# Patient Record
Sex: Female | Born: 1940
Health system: Southern US, Community
[De-identification: ages and names within clinical notes are randomized; demographics above are authoritative.]

## PROBLEM LIST (undated history)

## (undated) DIAGNOSIS — C801 Malignant (primary) neoplasm, unspecified: Secondary | ICD-10-CM

## (undated) DIAGNOSIS — M359 Systemic involvement of connective tissue, unspecified: Secondary | ICD-10-CM

## (undated) DIAGNOSIS — E079 Disorder of thyroid, unspecified: Secondary | ICD-10-CM

## (undated) DIAGNOSIS — E7800 Pure hypercholesterolemia, unspecified: Secondary | ICD-10-CM

## (undated) DIAGNOSIS — I5032 Chronic diastolic (congestive) heart failure: Secondary | ICD-10-CM

## (undated) DIAGNOSIS — K529 Noninfective gastroenteritis and colitis, unspecified: Secondary | ICD-10-CM

## (undated) DIAGNOSIS — D649 Anemia, unspecified: Secondary | ICD-10-CM

## (undated) DIAGNOSIS — E119 Type 2 diabetes mellitus without complications: Secondary | ICD-10-CM

## (undated) DIAGNOSIS — M069 Rheumatoid arthritis, unspecified: Secondary | ICD-10-CM

## (undated) DIAGNOSIS — K635 Polyp of colon: Secondary | ICD-10-CM

## (undated) DIAGNOSIS — I350 Nonrheumatic aortic (valve) stenosis: Secondary | ICD-10-CM

## (undated) DIAGNOSIS — Z952 Presence of prosthetic heart valve: Secondary | ICD-10-CM

## (undated) DIAGNOSIS — K589 Irritable bowel syndrome without diarrhea: Secondary | ICD-10-CM

## (undated) DIAGNOSIS — K625 Hemorrhage of anus and rectum: Secondary | ICD-10-CM

## (undated) DIAGNOSIS — Z950 Presence of cardiac pacemaker: Secondary | ICD-10-CM

## (undated) DIAGNOSIS — F32A Depression, unspecified: Secondary | ICD-10-CM

## (undated) DIAGNOSIS — I442 Atrioventricular block, complete: Secondary | ICD-10-CM

## (undated) DIAGNOSIS — I10 Essential (primary) hypertension: Secondary | ICD-10-CM

## (undated) DIAGNOSIS — M797 Fibromyalgia: Secondary | ICD-10-CM

## (undated) DIAGNOSIS — IMO0001 Reserved for inherently not codable concepts without codable children: Secondary | ICD-10-CM

## (undated) DIAGNOSIS — T1491XA Suicide attempt, initial encounter: Secondary | ICD-10-CM

## (undated) DIAGNOSIS — E039 Hypothyroidism, unspecified: Secondary | ICD-10-CM

## (undated) DIAGNOSIS — I2510 Atherosclerotic heart disease of native coronary artery without angina pectoris: Secondary | ICD-10-CM

## (undated) DIAGNOSIS — F329 Major depressive disorder, single episode, unspecified: Secondary | ICD-10-CM

## (undated) HISTORY — DX: Rheumatoid arthritis, unspecified: M06.9

## (undated) HISTORY — DX: Anemia, unspecified: D64.9

## (undated) HISTORY — DX: Polyp of colon: K63.5

## (undated) HISTORY — DX: Essential (primary) hypertension: I10

## (undated) HISTORY — DX: Noninfective gastroenteritis and colitis, unspecified: K52.9

## (undated) HISTORY — DX: Pure hypercholesterolemia, unspecified: E78.00

## (undated) HISTORY — DX: Irritable bowel syndrome, unspecified: K58.9

## (undated) HISTORY — PX: OTHER SURGICAL HISTORY: SHX169

## (undated) HISTORY — DX: Disorder of thyroid, unspecified: E07.9

## (undated) HISTORY — DX: Fibromyalgia: M79.7

## (undated) HISTORY — PX: CARDIAC SURGERY: SHX584

## (undated) HISTORY — DX: Hemorrhage of anus and rectum: K62.5

## (undated) HISTORY — PX: MASTECTOMY: SHX3

## (undated) HISTORY — PX: ABDOMINAL SURGERY: SHX537

## (undated) HISTORY — DX: Type 2 diabetes mellitus without complications: E11.9

## (undated) HISTORY — PX: LEFT OOPHORECTOMY: SHX1961

## (undated) HISTORY — PX: TOTAL ABDOMINAL HYSTERECTOMY: SHX209

---

## 1979-12-04 DIAGNOSIS — C801 Malignant (primary) neoplasm, unspecified: Secondary | ICD-10-CM

## 1979-12-04 HISTORY — PX: SPINE SURGERY: SHX786

## 1979-12-04 HISTORY — DX: Malignant (primary) neoplasm, unspecified: C80.1

## 2000-05-01 ENCOUNTER — Ambulatory Visit (HOSPITAL_COMMUNITY): Admission: RE | Admit: 2000-05-01 | Discharge: 2000-05-01 | Payer: Self-pay | Admitting: Gastroenterology

## 2000-06-06 ENCOUNTER — Encounter: Payer: Self-pay | Admitting: *Deleted

## 2000-06-06 ENCOUNTER — Encounter: Admission: RE | Admit: 2000-06-06 | Discharge: 2000-06-06 | Payer: Self-pay | Admitting: *Deleted

## 2000-07-04 ENCOUNTER — Encounter: Admission: RE | Admit: 2000-07-04 | Discharge: 2000-07-04 | Payer: Self-pay | Admitting: *Deleted

## 2000-07-04 ENCOUNTER — Encounter: Payer: Self-pay | Admitting: *Deleted

## 2001-07-08 ENCOUNTER — Encounter: Admission: RE | Admit: 2001-07-08 | Discharge: 2001-07-08 | Payer: Self-pay | Admitting: Internal Medicine

## 2001-07-08 ENCOUNTER — Encounter: Payer: Self-pay | Admitting: Internal Medicine

## 2001-08-08 ENCOUNTER — Encounter: Payer: Self-pay | Admitting: Internal Medicine

## 2001-08-08 ENCOUNTER — Encounter: Admission: RE | Admit: 2001-08-08 | Discharge: 2001-08-08 | Payer: Self-pay | Admitting: Internal Medicine

## 2003-09-01 ENCOUNTER — Encounter: Admission: RE | Admit: 2003-09-01 | Discharge: 2003-09-01 | Payer: Self-pay | Admitting: Internal Medicine

## 2003-09-01 ENCOUNTER — Encounter: Payer: Self-pay | Admitting: Internal Medicine

## 2004-09-19 ENCOUNTER — Encounter: Admission: RE | Admit: 2004-09-19 | Discharge: 2004-09-19 | Payer: Self-pay | Admitting: Internal Medicine

## 2005-02-19 ENCOUNTER — Encounter: Admission: RE | Admit: 2005-02-19 | Discharge: 2005-02-19 | Payer: Self-pay | Admitting: Internal Medicine

## 2005-04-02 ENCOUNTER — Emergency Department: Payer: Self-pay | Admitting: Emergency Medicine

## 2005-04-28 ENCOUNTER — Observation Stay: Payer: Self-pay | Admitting: Internal Medicine

## 2005-09-11 ENCOUNTER — Encounter: Admission: RE | Admit: 2005-09-11 | Discharge: 2005-09-11 | Payer: Self-pay | Admitting: Neurology

## 2005-11-20 ENCOUNTER — Encounter: Admission: RE | Admit: 2005-11-20 | Discharge: 2005-11-20 | Payer: Self-pay | Admitting: Internal Medicine

## 2005-12-29 ENCOUNTER — Other Ambulatory Visit: Payer: Self-pay

## 2005-12-29 ENCOUNTER — Inpatient Hospital Stay: Payer: Self-pay

## 2005-12-30 ENCOUNTER — Other Ambulatory Visit: Payer: Self-pay

## 2006-02-04 ENCOUNTER — Ambulatory Visit: Payer: Self-pay | Admitting: Gastroenterology

## 2006-02-20 ENCOUNTER — Ambulatory Visit: Payer: Self-pay | Admitting: Gastroenterology

## 2006-02-20 ENCOUNTER — Encounter (INDEPENDENT_AMBULATORY_CARE_PROVIDER_SITE_OTHER): Payer: Self-pay | Admitting: Specialist

## 2006-03-24 ENCOUNTER — Inpatient Hospital Stay (HOSPITAL_COMMUNITY): Admission: AC | Admit: 2006-03-24 | Discharge: 2006-03-27 | Payer: Self-pay

## 2006-03-27 ENCOUNTER — Inpatient Hospital Stay (HOSPITAL_COMMUNITY): Admission: RE | Admit: 2006-03-27 | Discharge: 2006-04-01 | Payer: Self-pay | Admitting: *Deleted

## 2006-03-28 ENCOUNTER — Ambulatory Visit: Payer: Self-pay | Admitting: *Deleted

## 2006-04-11 ENCOUNTER — Encounter (HOSPITAL_BASED_OUTPATIENT_CLINIC_OR_DEPARTMENT_OTHER): Admission: RE | Admit: 2006-04-11 | Discharge: 2006-06-03 | Payer: Self-pay | Admitting: Surgery

## 2006-04-26 ENCOUNTER — Ambulatory Visit (HOSPITAL_COMMUNITY): Payer: Self-pay | Admitting: Psychiatry

## 2006-05-17 ENCOUNTER — Ambulatory Visit (HOSPITAL_COMMUNITY): Payer: Self-pay | Admitting: Psychiatry

## 2006-07-24 ENCOUNTER — Ambulatory Visit (HOSPITAL_COMMUNITY): Payer: Self-pay | Admitting: Psychiatry

## 2006-09-16 ENCOUNTER — Encounter: Admission: RE | Admit: 2006-09-16 | Discharge: 2006-09-16 | Payer: Self-pay | Admitting: Internal Medicine

## 2006-10-21 ENCOUNTER — Ambulatory Visit (HOSPITAL_COMMUNITY): Payer: Self-pay | Admitting: Psychiatry

## 2006-11-10 ENCOUNTER — Emergency Department: Payer: Self-pay | Admitting: Internal Medicine

## 2007-01-20 ENCOUNTER — Ambulatory Visit (HOSPITAL_COMMUNITY): Payer: Self-pay | Admitting: Psychiatry

## 2007-04-23 ENCOUNTER — Ambulatory Visit (HOSPITAL_COMMUNITY): Payer: Self-pay | Admitting: Psychiatry

## 2007-04-30 ENCOUNTER — Encounter: Admission: RE | Admit: 2007-04-30 | Discharge: 2007-04-30 | Payer: Self-pay | Admitting: Internal Medicine

## 2007-07-21 ENCOUNTER — Ambulatory Visit (HOSPITAL_COMMUNITY): Payer: Self-pay | Admitting: Psychiatry

## 2007-10-20 ENCOUNTER — Ambulatory Visit (HOSPITAL_COMMUNITY): Payer: Self-pay | Admitting: Psychiatry

## 2008-01-19 ENCOUNTER — Ambulatory Visit (HOSPITAL_COMMUNITY): Payer: Self-pay | Admitting: Psychiatry

## 2008-04-12 ENCOUNTER — Ambulatory Visit (HOSPITAL_COMMUNITY): Payer: Self-pay | Admitting: Psychiatry

## 2008-05-17 ENCOUNTER — Telehealth: Payer: Self-pay | Admitting: Gastroenterology

## 2008-06-09 ENCOUNTER — Ambulatory Visit (HOSPITAL_COMMUNITY): Payer: Self-pay | Admitting: Psychiatry

## 2008-07-02 ENCOUNTER — Ambulatory Visit (HOSPITAL_COMMUNITY): Payer: Self-pay | Admitting: Psychiatry

## 2008-09-29 ENCOUNTER — Ambulatory Visit (HOSPITAL_COMMUNITY): Payer: Self-pay | Admitting: Psychiatry

## 2008-10-30 ENCOUNTER — Emergency Department: Payer: Self-pay | Admitting: Unknown Physician Specialty

## 2008-11-22 ENCOUNTER — Encounter: Admission: RE | Admit: 2008-11-22 | Discharge: 2008-11-22 | Payer: Self-pay | Admitting: Internal Medicine

## 2008-12-29 ENCOUNTER — Ambulatory Visit (HOSPITAL_COMMUNITY): Payer: Self-pay | Admitting: Psychiatry

## 2009-03-21 ENCOUNTER — Ambulatory Visit (HOSPITAL_COMMUNITY): Payer: Self-pay | Admitting: Psychiatry

## 2009-06-20 ENCOUNTER — Ambulatory Visit (HOSPITAL_COMMUNITY): Payer: Self-pay | Admitting: Psychiatry

## 2009-09-14 ENCOUNTER — Ambulatory Visit (HOSPITAL_COMMUNITY): Payer: Self-pay | Admitting: Psychiatry

## 2009-11-13 ENCOUNTER — Inpatient Hospital Stay: Payer: Self-pay | Admitting: General Surgery

## 2009-11-28 ENCOUNTER — Encounter: Admission: RE | Admit: 2009-11-28 | Discharge: 2009-11-28 | Payer: Self-pay | Admitting: Internal Medicine

## 2009-12-21 ENCOUNTER — Ambulatory Visit (HOSPITAL_COMMUNITY): Payer: Self-pay | Admitting: Psychiatry

## 2010-03-15 ENCOUNTER — Ambulatory Visit (HOSPITAL_COMMUNITY): Payer: Self-pay | Admitting: Psychiatry

## 2010-03-21 ENCOUNTER — Encounter: Admission: RE | Admit: 2010-03-21 | Discharge: 2010-03-21 | Payer: Self-pay | Admitting: Internal Medicine

## 2010-04-21 ENCOUNTER — Encounter: Admission: RE | Admit: 2010-04-21 | Discharge: 2010-04-21 | Payer: Self-pay | Admitting: Internal Medicine

## 2010-05-11 ENCOUNTER — Encounter: Admission: RE | Admit: 2010-05-11 | Discharge: 2010-05-11 | Payer: Self-pay | Admitting: Internal Medicine

## 2010-07-26 ENCOUNTER — Ambulatory Visit (HOSPITAL_COMMUNITY): Payer: Self-pay | Admitting: Psychiatry

## 2010-09-11 ENCOUNTER — Encounter: Admission: RE | Admit: 2010-09-11 | Discharge: 2010-09-11 | Payer: Self-pay | Admitting: Internal Medicine

## 2010-11-22 ENCOUNTER — Ambulatory Visit (HOSPITAL_COMMUNITY): Payer: Self-pay | Admitting: Psychiatry

## 2011-02-14 ENCOUNTER — Encounter: Payer: Self-pay | Admitting: Gastroenterology

## 2011-02-20 NOTE — Letter (Signed)
Summary: Colonoscopy Letter  [facility]  [address] [place], [place] [address]   Phone: [phone]  Fax: [phone]      February 14, 2011 MRN: [id number]   Catherine [name] [address] [place], [place]  [address]   Dear [name],   According to your medical record, it is time for you to schedule a Colonoscopy. The American Cancer Society recommends this procedure as a method to detect early colon cancer. Patients with a family history of colon cancer, or a personal history of colon polyps or inflammatory bowel disease are at increased risk.  This letter has been generated based on the recommendations made at the time of your procedure. If you feel that in your particular situation this may no longer apply, please contact our office.  Please call our office at [phone] to schedule this appointment or to update your records at your earliest convenience.  Thank you for cooperating with [place] to provide you with the very best care possible.   Sincerely,  Judie Petit T. Russella Dar, M.D.  [facility] [phone]

## 2011-03-21 ENCOUNTER — Encounter (HOSPITAL_COMMUNITY): Payer: Self-pay | Admitting: Psychiatry

## 2011-03-28 ENCOUNTER — Encounter (HOSPITAL_COMMUNITY): Payer: 59 | Admitting: Psychiatry

## 2011-03-28 DIAGNOSIS — F331 Major depressive disorder, recurrent, moderate: Secondary | ICD-10-CM
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
## 2011-04-20 NOTE — Procedures (Signed)
[facility]  Patient:    Catherine [name], Catherine [name]                       MRN: [id number] Proc. Date: 05/01/00 Adm. Date:  [id number] Attending:  Louie Bun CC:         Georg Ruddle. Viviann Spare, M.D.                           Procedure Report  INDICATION FOR PROCEDURE:  History of adenomatous colon polyps with last colonoscopy for years ago.  DESCRIPTION OF PROCEDURE:  The patient was placed in the left lateral decubitus position and placed on the pulse monitor with continuous low-flow oxygen delivered by nasal cannula.  She was sedated with 70 mg of IV Demerol and 7 mg of IV Versed.  The Olympus video colonoscope was inserted into the rectum and advanced to the cecum, confirmed by transillumination of McBurneys point and visualization of the ileocecal valve and appendiceal orifice.  The prep was good, although slightly limited in the ascending colon and cecum.  I could conclusively rule out small lesions less than 1 cm in diameter in all areas.  Otherwise the cecum, ascending, transverse, descending, and sigmoid colon appeared normal with no masses, polyps, diverticula, or other mucosal abnormalities.  The rectum likewise appeared normal and retroflexed view of the anus did reveal some small internal hemorrhoids.  The colonoscope was then withdrawn and the patient returned to the recovery room in stable condition. She tolerated the procedure well and there were no immediate complications.  IMPRESSION:  Internal hemorrhoids, otherwise normal colonoscopy.  PLAN:  Repeat colonoscopy in five years. DD:  05/01/00 TD:  05/02/00 Job: [id number]
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
## 2011-07-17 ENCOUNTER — Ambulatory Visit
Admission: RE | Admit: 2011-07-17 | Discharge: 2011-07-17 | Disposition: A | Discharge status: Home / Self Care | Payer: 59 | Source: Ambulatory Visit | Attending: Internal Medicine | Admitting: Internal Medicine

## 2011-07-17 ENCOUNTER — Other Ambulatory Visit: Payer: Self-pay | Admitting: Internal Medicine

## 2011-07-17 DIAGNOSIS — R609 Edema, unspecified: Secondary | ICD-10-CM

## 2011-07-17 DIAGNOSIS — M79604 Pain in right leg: Secondary | ICD-10-CM

## 2011-07-30 ENCOUNTER — Encounter (HOSPITAL_COMMUNITY): Payer: 59 | Admitting: Psychiatry

## 2011-08-13 ENCOUNTER — Encounter (HOSPITAL_COMMUNITY): Payer: 59 | Admitting: Psychiatry

## 2011-08-13 DIAGNOSIS — F331 Major depressive disorder, recurrent, moderate: Secondary | ICD-10-CM

## 2011-09-21 ENCOUNTER — Ambulatory Visit: Payer: Self-pay | Admitting: General Surgery

## 2011-12-05 ENCOUNTER — Encounter (HOSPITAL_COMMUNITY): Payer: 59 | Admitting: Psychiatry

## 2011-12-14 ENCOUNTER — Ambulatory Visit (HOSPITAL_COMMUNITY): Payer: 59 | Admitting: Psychiatry

## 2012-01-14 ENCOUNTER — Encounter (HOSPITAL_COMMUNITY): Payer: Self-pay | Admitting: Psychiatry

## 2012-01-14 ENCOUNTER — Ambulatory Visit (INDEPENDENT_AMBULATORY_CARE_PROVIDER_SITE_OTHER): Payer: 59 | Admitting: Psychiatry

## 2012-01-14 VITALS — BP 112/71 | HR 68 | Ht 63.0 in | Wt 221.0 lb

## 2012-01-14 DIAGNOSIS — F329 Major depressive disorder, single episode, unspecified: Secondary | ICD-10-CM

## 2012-01-14 MED ORDER — CITALOPRAM HYDROBROMIDE 40 MG PO TABS: 40.0000 mg | ORAL_TABLET | Freq: Every day | ORAL | Status: DC

## 2012-01-14 MED ORDER — CLONAZEPAM 0.5 MG PO TABS: 0.5000 mg | ORAL_TABLET | Freq: Every evening | ORAL | Status: DC | PRN
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
## 2012-05-14 ENCOUNTER — Ambulatory Visit (HOSPITAL_COMMUNITY): Payer: Self-pay | Admitting: Psychiatry

## 2012-05-26 ENCOUNTER — Encounter (HOSPITAL_COMMUNITY): Payer: Self-pay | Admitting: Psychiatry

## 2012-05-26 ENCOUNTER — Ambulatory Visit (INDEPENDENT_AMBULATORY_CARE_PROVIDER_SITE_OTHER): Payer: 59 | Admitting: Psychiatry

## 2012-05-26 VITALS — BP 113/82 | HR 74 | Wt 218.6 lb

## 2012-05-26 DIAGNOSIS — F329 Major depressive disorder, single episode, unspecified: Secondary | ICD-10-CM

## 2012-05-26 MED ORDER — CITALOPRAM HYDROBROMIDE 40 MG PO TABS: 40.0000 mg | ORAL_TABLET | Freq: Every day | ORAL | Status: DC

## 2012-05-26 MED ORDER — CLONAZEPAM 0.5 MG PO TABS: 0.5000 mg | ORAL_TABLET | Freq: Every evening | ORAL | Status: DC | PRN
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
## 2012-06-26 ENCOUNTER — Other Ambulatory Visit: Payer: Self-pay | Admitting: Internal Medicine

## 2012-06-26 DIAGNOSIS — Z1231 Encounter for screening mammogram for malignant neoplasm of breast: Secondary | ICD-10-CM

## 2012-06-26 DIAGNOSIS — Z9012 Acquired absence of left breast and nipple: Secondary | ICD-10-CM

## 2012-07-11 ENCOUNTER — Ambulatory Visit
Admission: RE | Admit: 2012-07-11 | Discharge: 2012-07-11 | Disposition: A | Discharge status: Home / Self Care | Payer: 59 | Source: Ambulatory Visit | Attending: Internal Medicine | Admitting: Internal Medicine

## 2012-07-11 DIAGNOSIS — Z1231 Encounter for screening mammogram for malignant neoplasm of breast: Secondary | ICD-10-CM

## 2012-07-11 DIAGNOSIS — Z9012 Acquired absence of left breast and nipple: Secondary | ICD-10-CM

## 2012-07-16 ENCOUNTER — Other Ambulatory Visit: Payer: Self-pay | Admitting: Radiation Oncology

## 2012-07-16 ENCOUNTER — Other Ambulatory Visit: Payer: Self-pay | Admitting: Internal Medicine

## 2012-07-16 DIAGNOSIS — R928 Other abnormal and inconclusive findings on diagnostic imaging of breast: Secondary | ICD-10-CM

## 2012-07-23 ENCOUNTER — Other Ambulatory Visit: Payer: Self-pay

## 2012-07-30 ENCOUNTER — Ambulatory Visit
Admission: RE | Admit: 2012-07-30 | Discharge: 2012-07-30 | Disposition: A | Discharge status: Home / Self Care | Payer: Medicare PPO | Source: Ambulatory Visit | Attending: Internal Medicine | Admitting: Internal Medicine

## 2012-07-30 DIAGNOSIS — R928 Other abnormal and inconclusive findings on diagnostic imaging of breast: Secondary | ICD-10-CM

## 2012-08-20 ENCOUNTER — Encounter: Payer: Self-pay | Admitting: Gastroenterology

## 2012-09-16 ENCOUNTER — Other Ambulatory Visit (HOSPITAL_COMMUNITY): Payer: Self-pay | Admitting: Psychiatry

## 2012-09-16 DIAGNOSIS — F329 Major depressive disorder, single episode, unspecified: Secondary | ICD-10-CM

## 2012-09-17 ENCOUNTER — Telehealth (HOSPITAL_COMMUNITY): Payer: Self-pay | Admitting: *Deleted

## 2012-09-18 ENCOUNTER — Telehealth (HOSPITAL_COMMUNITY): Payer: Self-pay | Admitting: *Deleted

## 2012-09-18 NOTE — Telephone Encounter (Signed)
Informed Dr.Arfeen of events noted in calls section. Following orders received from Dr.Arfeen by this writer: Contact [facility] and instruct them not to fill RX given on 10/15 as pt should have enough medicine. This was completed @ 1537 Contact Dr.Roy Ludwig Clarks to inform him that pt also receiving Klonopin from Dr. Lolly Mustache.Left Msg for MD to call [facility] office back @ 1543 Contact pt and tell her he will need to see her on Monday 10/21 - her next appt --before he prescribes any more Klonopin. Her last refill was for #90 on 8/26, and her dosage is once/day. Attempted to contact pt @ [phone] -No VM,no answer.

## 2012-09-18 NOTE — Telephone Encounter (Signed)
See phone note

## 2012-09-19 ENCOUNTER — Telehealth (HOSPITAL_COMMUNITY): Payer: Self-pay

## 2012-09-19 ENCOUNTER — Other Ambulatory Visit (HOSPITAL_COMMUNITY): Payer: Self-pay | Admitting: Psychiatry

## 2012-09-19 NOTE — Telephone Encounter (Signed)
Spoke to patient about discrepancy of benzodiazepine.  Patient admitted that she was getting her benzodiazepine by her primary care physician to mail order.  When I ask why she is getting sane medication from [place] patient reported that sometime her 90 day prescription does not come on time.  Her 90 day prescription is through insurance.  Meanwhile the prescription she is getting from [place] is cash paid.  Patient is scheduled to see me on October 21.  I explained to the patient that we will discuss this issue further on her next appointment.

## 2012-09-22 ENCOUNTER — Encounter (HOSPITAL_COMMUNITY): Payer: Self-pay | Admitting: Psychiatry

## 2012-09-22 ENCOUNTER — Ambulatory Visit (INDEPENDENT_AMBULATORY_CARE_PROVIDER_SITE_OTHER): Payer: Medicare PPO | Admitting: Psychiatry

## 2012-09-22 VITALS — BP 142/60 | HR 61 | Wt 222.4 lb

## 2012-09-22 DIAGNOSIS — F329 Major depressive disorder, single episode, unspecified: Secondary | ICD-10-CM

## 2012-09-22 MED ORDER — CITALOPRAM HYDROBROMIDE 40 MG PO TABS: 40.0000 mg | ORAL_TABLET | Freq: Every day | ORAL | Status: DC
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
## 2012-12-05 ENCOUNTER — Emergency Department: Payer: Self-pay | Admitting: Emergency Medicine

## 2012-12-05 LAB — URINALYSIS, COMPLETE
Bilirubin,UR: NEGATIVE
Blood: NEGATIVE
Ketone: NEGATIVE
Ph: 6 (ref 4.5–8.0)
Protein: 30
Specific Gravity: 1.016 (ref 1.003–1.030)

## 2012-12-05 LAB — BASIC METABOLIC PANEL
Anion Gap: 7 (ref 7–16)
BUN: 10 mg/dL (ref 7–18)
Calcium, Total: 9.2 mg/dL (ref 8.5–10.1)
Co2: 28 mmol/L (ref 21–32)
EGFR (African American): >60
EGFR (Non-African Amer.): >60
Glucose: 132 mg/dL — ABNORMAL HIGH (ref 65–99)
Osmolality: 278 (ref 275–301)
Potassium: 3.8 mmol/L (ref 3.5–5.1)

## 2012-12-05 LAB — CBC
HGB: 13.3 g/dL (ref 12.0–16.0)
RDW: 15 % — ABNORMAL HIGH (ref 11.5–14.5)
WBC: 10.8 10*3/uL (ref 3.6–11.0)

## 2012-12-07 LAB — URINE CULTURE

## 2012-12-09 ENCOUNTER — Other Ambulatory Visit: Payer: Self-pay | Admitting: Internal Medicine

## 2012-12-09 DIAGNOSIS — R109 Unspecified abdominal pain: Secondary | ICD-10-CM

## 2012-12-09 DIAGNOSIS — N2889 Other specified disorders of kidney and ureter: Secondary | ICD-10-CM

## 2012-12-11 ENCOUNTER — Ambulatory Visit
Admission: RE | Admit: 2012-12-11 | Discharge: 2012-12-11 | Disposition: A | Discharge status: Home / Self Care | Payer: Medicare PPO | Source: Ambulatory Visit | Attending: Internal Medicine | Admitting: Internal Medicine

## 2012-12-11 DIAGNOSIS — R109 Unspecified abdominal pain: Secondary | ICD-10-CM

## 2012-12-11 DIAGNOSIS — N2889 Other specified disorders of kidney and ureter: Secondary | ICD-10-CM

## 2012-12-15 ENCOUNTER — Ambulatory Visit (HOSPITAL_COMMUNITY): Payer: Self-pay | Admitting: Psychiatry

## 2012-12-16 ENCOUNTER — Ambulatory Visit (HOSPITAL_COMMUNITY): Payer: Self-pay | Admitting: Psychiatry

## 2012-12-30 ENCOUNTER — Ambulatory Visit (HOSPITAL_COMMUNITY): Payer: Self-pay | Admitting: Psychiatry

## 2013-01-06 ENCOUNTER — Other Ambulatory Visit: Payer: Self-pay | Admitting: Orthopaedic Surgery

## 2013-01-06 DIAGNOSIS — M545 Low back pain: Secondary | ICD-10-CM

## 2013-01-10 ENCOUNTER — Ambulatory Visit
Admission: RE | Admit: 2013-01-10 | Discharge: 2013-01-10 | Disposition: A | Discharge status: Home / Self Care | Payer: Medicare PPO | Source: Ambulatory Visit | Attending: Orthopaedic Surgery | Admitting: Orthopaedic Surgery

## 2013-01-10 DIAGNOSIS — M545 Low back pain: Secondary | ICD-10-CM

## 2013-01-13 ENCOUNTER — Ambulatory Visit (HOSPITAL_COMMUNITY): Payer: Self-pay | Admitting: Psychiatry

## 2013-02-20 ENCOUNTER — Other Ambulatory Visit: Payer: Self-pay | Admitting: Internal Medicine

## 2013-02-20 DIAGNOSIS — N6489 Other specified disorders of breast: Secondary | ICD-10-CM

## 2013-03-11 ENCOUNTER — Other Ambulatory Visit: Payer: Self-pay | Admitting: Internal Medicine

## 2013-03-11 ENCOUNTER — Ambulatory Visit
Admission: RE | Admit: 2013-03-11 | Discharge: 2013-03-11 | Disposition: A | Discharge status: Home / Self Care | Payer: Medicare PPO | Source: Ambulatory Visit | Attending: Internal Medicine | Admitting: Internal Medicine

## 2013-03-11 DIAGNOSIS — N6489 Other specified disorders of breast: Secondary | ICD-10-CM

## 2013-10-14 ENCOUNTER — Other Ambulatory Visit: Payer: Self-pay | Admitting: Internal Medicine

## 2013-10-14 DIAGNOSIS — N6489 Other specified disorders of breast: Secondary | ICD-10-CM

## 2013-11-03 ENCOUNTER — Ambulatory Visit
Admission: RE | Admit: 2013-11-03 | Discharge: 2013-11-03 | Disposition: A | Discharge status: Home / Self Care | Payer: Medicare PPO | Source: Ambulatory Visit | Attending: Internal Medicine | Admitting: Internal Medicine

## 2013-11-03 DIAGNOSIS — N6489 Other specified disorders of breast: Secondary | ICD-10-CM

## 2013-11-13 ENCOUNTER — Ambulatory Visit: Payer: Self-pay | Admitting: Podiatry

## 2013-11-20 ENCOUNTER — Ambulatory Visit (INDEPENDENT_AMBULATORY_CARE_PROVIDER_SITE_OTHER): Payer: Medicare PPO | Admitting: Podiatry

## 2013-11-20 VITALS — BP 125/66 | HR 68 | Resp 16

## 2013-11-20 DIAGNOSIS — B351 Tinea unguium: Secondary | ICD-10-CM

## 2013-11-20 DIAGNOSIS — E1159 Type 2 diabetes mellitus with other circulatory complications: Secondary | ICD-10-CM

## 2013-11-20 DIAGNOSIS — M79609 Pain in unspecified limb: Secondary | ICD-10-CM

## 2013-11-20 NOTE — Progress Notes (Signed)
   Subjective:    Patient ID: Catherine [name], female    DOB: September 13, 1941, 72 y.o.   MRN: [id number]  HPI Comments: Toenails and measure for shoes      Review of Systems     Objective:   Physical Exam        Assessment & Plan:

## 2013-11-20 NOTE — Progress Notes (Signed)
Subjective:     Patient ID: Catherine [name], female   DOB: 06/12/1941, 72 y.o.   MRN: [id number]  HPI patient presents with nail disease 1-5 both feet and circulatory issues with long-term diabetes of both feet   Review of Systems     Objective:   Physical Exam Neurovascular status reduced with no health history changes noted and nail disease with pain 1-5 both feet with diminishment of   DP and PT pulses Assessment:     At risk diabetic with structural deformity and nail disease with pain 1-5 both    Plan:     H&P done and I did go ahead today and casted for new diabetic shoes as they help her immensely. Debridement nailbeds 1-5 both feet with no bleeding

## 2013-12-22 ENCOUNTER — Encounter: Payer: Self-pay | Admitting: *Deleted

## 2013-12-22 NOTE — Progress Notes (Signed)
Sent pt post card letting her know diabetic shoes are in.

## 2014-02-02 ENCOUNTER — Emergency Department: Payer: Self-pay | Admitting: Emergency Medicine

## 2014-02-02 LAB — COMPREHENSIVE METABOLIC PANEL
ALT: 20 U/L (ref 12–78)
ANION GAP: 2 — AB (ref 7–16)
AST: 26 U/L (ref 15–37)
Albumin: 3.8 g/dL (ref 3.4–5.0)
Alkaline Phosphatase: 79 U/L
BUN: 24 mg/dL — AB (ref 7–18)
Bilirubin,Total: 0.8 mg/dL (ref 0.2–1.0)
CALCIUM: 9.2 mg/dL (ref 8.5–10.1)
CHLORIDE: 102 mmol/L (ref 98–107)
CO2: 29 mmol/L (ref 21–32)
Creatinine: 1.01 mg/dL (ref 0.60–1.30)
EGFR (African American): >60
EGFR (Non-African Amer.): 55 — ABNORMAL LOW
GLUCOSE: 129 mg/dL — AB (ref 65–99)
Osmolality: 272 (ref 275–301)
Potassium: 4.2 mmol/L (ref 3.5–5.1)
Sodium: 133 mmol/L — ABNORMAL LOW (ref 136–145)
Total Protein: 8 g/dL (ref 6.4–8.2)

## 2014-02-02 LAB — CBC WITH DIFFERENTIAL/PLATELET
BASOS ABS: 0.1 10*3/uL (ref 0.0–0.1)
BASOS PCT: 1.2 %
EOS ABS: 0.4 10*3/uL (ref 0.0–0.7)
EOS PCT: 4.6 %
HCT: 36.9 % (ref 35.0–47.0)
HGB: 12.1 g/dL (ref 12.0–16.0)
LYMPHS ABS: 2 10*3/uL (ref 1.0–3.6)
LYMPHS PCT: 22.1 %
MCH: 27.8 pg (ref 26.0–34.0)
MCHC: 32.7 g/dL (ref 32.0–36.0)
MCV: 85 fL (ref 80–100)
Monocyte #: 0.9 x10 3/mm (ref 0.2–0.9)
Monocyte %: 9.6 %
NEUTROS ABS: 5.6 10*3/uL (ref 1.4–6.5)
Neutrophil %: 62.5 %
Platelet: 263 10*3/uL (ref 150–440)
RBC: 4.34 10*6/uL (ref 3.80–5.20)
RDW: 14.5 % (ref 11.5–14.5)
WBC: 9 10*3/uL (ref 3.6–11.0)

## 2014-02-19 ENCOUNTER — Ambulatory Visit: Payer: Medicare PPO | Admitting: Podiatry

## 2014-02-26 ENCOUNTER — Ambulatory Visit (INDEPENDENT_AMBULATORY_CARE_PROVIDER_SITE_OTHER): Payer: Medicare PPO | Admitting: Podiatry

## 2014-02-26 VITALS — Resp 16 | Ht 63.0 in | Wt 197.0 lb

## 2014-02-26 DIAGNOSIS — M79609 Pain in unspecified limb: Secondary | ICD-10-CM | POA: Diagnosis not present

## 2014-02-26 DIAGNOSIS — E1159 Type 2 diabetes mellitus with other circulatory complications: Secondary | ICD-10-CM | POA: Diagnosis not present

## 2014-02-26 DIAGNOSIS — B351 Tinea unguium: Secondary | ICD-10-CM | POA: Diagnosis not present

## 2014-02-26 DIAGNOSIS — M2010 Hallux valgus (acquired), unspecified foot: Secondary | ICD-10-CM

## 2014-02-26 NOTE — Progress Notes (Signed)
Subjective:     Patient ID: Catherine [name], female   DOB: 12-05-1940, 73 y.o.   MRN: [id number]  HPI patient presents stating I am ready for my diabetic shoes and my nails have been bothering me as they are thick and he gets sore in the corners when they grow out   Review of Systems     Objective:   Physical Exam Neurovascular status unchanged was structural bunion deformity noted varicosities diminished circulatory neurological sensation with long-term at risk diabetes. Also noted nail disease 1-5 both feet with thickness and discomfort    Assessment:     Long-term at risk diabetic with structural deformity and nail disease with pain 1-5 both feet    Plan:     Diabetic shoes with custom insoles were dispensed with patient with instructions for usage and debridement nailbeds 1-5 both feet with no bleeding noted

## 2014-04-08 ENCOUNTER — Other Ambulatory Visit: Payer: Self-pay

## 2014-04-08 DIAGNOSIS — Z1231 Encounter for screening mammogram for malignant neoplasm of breast: Secondary | ICD-10-CM

## 2014-05-11 ENCOUNTER — Ambulatory Visit: Payer: Self-pay

## 2014-05-19 ENCOUNTER — Ambulatory Visit
Admission: RE | Admit: 2014-05-19 | Discharge: 2014-05-19 | Disposition: A | Discharge status: Home / Self Care | Payer: Medicare PPO | Source: Ambulatory Visit

## 2014-05-19 DIAGNOSIS — Z1231 Encounter for screening mammogram for malignant neoplasm of breast: Secondary | ICD-10-CM

## 2014-05-28 ENCOUNTER — Ambulatory Visit (INDEPENDENT_AMBULATORY_CARE_PROVIDER_SITE_OTHER): Payer: Medicare PPO | Admitting: Podiatry

## 2014-05-28 VITALS — BP 120/51 | HR 79 | Resp 16

## 2014-05-28 DIAGNOSIS — M79609 Pain in unspecified limb: Secondary | ICD-10-CM

## 2014-05-28 DIAGNOSIS — B351 Tinea unguium: Secondary | ICD-10-CM

## 2014-05-28 DIAGNOSIS — M79673 Pain in unspecified foot: Secondary | ICD-10-CM

## 2014-05-29 NOTE — Progress Notes (Signed)
Subjective:     Patient ID: Catherine [name], female   DOB: 02-20-1941, 73 y.o.   MRN: [id number]  HPI patient presents stating I need my nails cut they're thick and painful and I cannot do that   Review of Systems     Objective:   Physical Exam Neurovascular status intact with thick yellow brittle nailbeds 1-5 of both feet    Assessment:     Mycotic nail infection is with pain 1-5 both feet    Plan:     Debris painful nailbeds 1-5 both feet with no iatrogenic bleeding noted

## 2014-06-11 ENCOUNTER — Other Ambulatory Visit: Payer: Self-pay | Admitting: Internal Medicine

## 2014-06-11 DIAGNOSIS — R1011 Right upper quadrant pain: Secondary | ICD-10-CM

## 2014-06-18 ENCOUNTER — Other Ambulatory Visit: Payer: Self-pay | Admitting: Internal Medicine

## 2014-06-18 ENCOUNTER — Encounter: Payer: Self-pay | Admitting: Radiology

## 2014-06-18 ENCOUNTER — Ambulatory Visit
Admission: RE | Admit: 2014-06-18 | Discharge: 2014-06-18 | Disposition: A | Discharge status: Home / Self Care | Payer: Medicare PPO | Source: Ambulatory Visit | Attending: Internal Medicine | Admitting: Internal Medicine

## 2014-06-18 DIAGNOSIS — R1011 Right upper quadrant pain: Secondary | ICD-10-CM

## 2014-06-27 ENCOUNTER — Inpatient Hospital Stay: Payer: Self-pay | Admitting: Internal Medicine

## 2014-06-27 LAB — URINALYSIS, COMPLETE
BILIRUBIN, UR: NEGATIVE
Bacteria: NONE SEEN
Blood: NEGATIVE
Glucose,UR: NEGATIVE mg/dL (ref 0–75)
Ketone: NEGATIVE
LEUKOCYTE ESTERASE: NEGATIVE
NITRITE: NEGATIVE
PROTEIN: NEGATIVE
Ph: 6 (ref 4.5–8.0)
RBC,UR: <1 /HPF (ref 0–5)
Specific Gravity: 1.005 (ref 1.003–1.030)
WBC UR: <1 /HPF (ref 0–5)

## 2014-06-27 LAB — COMPREHENSIVE METABOLIC PANEL
ALK PHOS: 74 U/L
Albumin: 3.7 g/dL (ref 3.4–5.0)
Anion Gap: 6 — ABNORMAL LOW (ref 7–16)
BUN: 23 mg/dL — AB (ref 7–18)
Bilirubin,Total: 0.8 mg/dL (ref 0.2–1.0)
CALCIUM: 9 mg/dL (ref 8.5–10.1)
CREATININE: 1.19 mg/dL (ref 0.60–1.30)
Chloride: 98 mmol/L (ref 98–107)
Co2: 27 mmol/L (ref 21–32)
EGFR (Non-African Amer.): 45 — ABNORMAL LOW
GFR CALC AF AMER: 52 — AB
Glucose: 136 mg/dL — ABNORMAL HIGH (ref 65–99)
Osmolality: 268 (ref 275–301)
POTASSIUM: 4.1 mmol/L (ref 3.5–5.1)
SGOT(AST): 19 U/L (ref 15–37)
SGPT (ALT): 20 U/L
SODIUM: 131 mmol/L — AB (ref 136–145)
Total Protein: 7.7 g/dL (ref 6.4–8.2)

## 2014-06-27 LAB — CBC
HCT: 35.1 % (ref 35.0–47.0)
HGB: 11.4 g/dL — AB (ref 12.0–16.0)
MCH: 28 pg (ref 26.0–34.0)
MCHC: 32.5 g/dL (ref 32.0–36.0)
MCV: 86 fL (ref 80–100)
Platelet: 287 10*3/uL (ref 150–440)
RBC: 4.08 10*6/uL (ref 3.80–5.20)
RDW: 15 % — AB (ref 11.5–14.5)
WBC: 15.7 10*3/uL — AB (ref 3.6–11.0)

## 2014-06-27 LAB — DIFFERENTIAL
BASOS PCT: 0.8 %
Basophil #: 0.1 10*3/uL (ref 0.0–0.1)
EOS PCT: 2.6 %
Eosinophil #: 0.4 10*3/uL (ref 0.0–0.7)
LYMPHS ABS: 2.1 10*3/uL (ref 1.0–3.6)
Lymphocyte %: 13 %
Monocyte #: 1.2 x10 3/mm — ABNORMAL HIGH (ref 0.2–0.9)
Monocyte %: 7.4 %
NEUTROS PCT: 76.2 %
Neutrophil #: 12 10*3/uL — ABNORMAL HIGH (ref 1.4–6.5)

## 2014-06-27 LAB — LIPASE, BLOOD: Lipase: 115 U/L (ref 73–393)

## 2014-06-28 LAB — CLOSTRIDIUM DIFFICILE(ARMC)

## 2014-06-28 LAB — TROPONIN I
Troponin-I: <0.02 ng/mL
Troponin-I: <0.02 ng/mL
Troponin-I: <0.02 ng/mL

## 2014-06-28 LAB — CBC WITH DIFFERENTIAL/PLATELET
BASOS PCT: 0.9 %
Basophil #: 0.1 10*3/uL (ref 0.0–0.1)
EOS ABS: 0.4 10*3/uL (ref 0.0–0.7)
Eosinophil %: 2.7 %
HCT: 32.2 % — ABNORMAL LOW (ref 35.0–47.0)
HGB: 10.5 g/dL — AB (ref 12.0–16.0)
Lymphocyte #: 1.5 10*3/uL (ref 1.0–3.6)
Lymphocyte %: 10.4 %
MCH: 28 pg (ref 26.0–34.0)
MCHC: 32.5 g/dL (ref 32.0–36.0)
MCV: 86 fL (ref 80–100)
MONOS PCT: 8.6 %
Monocyte #: 1.2 x10 3/mm — ABNORMAL HIGH (ref 0.2–0.9)
NEUTROS ABS: 10.9 10*3/uL — AB (ref 1.4–6.5)
Neutrophil %: 77.4 %
PLATELETS: 214 10*3/uL (ref 150–440)
RBC: 3.73 10*6/uL — AB (ref 3.80–5.20)
RDW: 14.9 % — AB (ref 11.5–14.5)
WBC: 14.1 10*3/uL — ABNORMAL HIGH (ref 3.6–11.0)

## 2014-06-28 LAB — BASIC METABOLIC PANEL
Anion Gap: 8 (ref 7–16)
BUN: 16 mg/dL (ref 7–18)
CALCIUM: 8.5 mg/dL (ref 8.5–10.1)
CHLORIDE: 102 mmol/L (ref 98–107)
CREATININE: 0.96 mg/dL (ref 0.60–1.30)
Co2: 27 mmol/L (ref 21–32)
GFR CALC NON AF AMER: 58 — AB
GLUCOSE: 114 mg/dL — AB (ref 65–99)
OSMOLALITY: 276 (ref 275–301)
POTASSIUM: 3.7 mmol/L (ref 3.5–5.1)
Sodium: 137 mmol/L (ref 136–145)

## 2014-06-28 LAB — CK-MB
CK-MB: 1.5 ng/mL (ref 0.5–3.6)
CK-MB: 1.6 ng/mL (ref 0.5–3.6)
CK-MB: 1.8 ng/mL (ref 0.5–3.6)

## 2014-06-29 LAB — BASIC METABOLIC PANEL
Anion Gap: 8 (ref 7–16)
BUN: 6 mg/dL — AB (ref 7–18)
CHLORIDE: 105 mmol/L (ref 98–107)
CO2: 27 mmol/L (ref 21–32)
CREATININE: 0.71 mg/dL (ref 0.60–1.30)
Calcium, Total: 8.6 mg/dL (ref 8.5–10.1)
EGFR (African American): >60
Glucose: 118 mg/dL — ABNORMAL HIGH (ref 65–99)
OSMOLALITY: 278 (ref 275–301)
Potassium: 3.3 mmol/L — ABNORMAL LOW (ref 3.5–5.1)
Sodium: 140 mmol/L (ref 136–145)

## 2014-06-29 LAB — MAGNESIUM: MAGNESIUM: 2 mg/dL

## 2014-06-29 LAB — WBCS, STOOL

## 2014-06-30 LAB — STOOL CULTURE

## 2014-08-27 ENCOUNTER — Ambulatory Visit (INDEPENDENT_AMBULATORY_CARE_PROVIDER_SITE_OTHER): Payer: Medicare PPO | Admitting: Podiatry

## 2014-08-27 DIAGNOSIS — M79676 Pain in unspecified toe(s): Secondary | ICD-10-CM

## 2014-08-27 DIAGNOSIS — M79609 Pain in unspecified limb: Secondary | ICD-10-CM

## 2014-08-27 DIAGNOSIS — B351 Tinea unguium: Secondary | ICD-10-CM

## 2014-08-27 NOTE — Progress Notes (Signed)
Subjective:     Patient ID: Catherine [name], female   DOB: 1941-06-19, 73 y.o.   MRN: [id number]  HPI patient presents with nail disease 1-5 of both feet that are thick and impossible for her to cut   Review of Systems     Objective:   Physical Exam Neurovascular status intact with thick yellow brittle nailbeds 1-5 both feet that are painful when pressed dorsally    Assessment:     Mycotic nail infection with pain 1-5 both feet    Plan:     Debris painful nailbeds 1-5 both feet with no iatrogenic leaving noted

## 2014-11-23 ENCOUNTER — Ambulatory Visit (INDEPENDENT_AMBULATORY_CARE_PROVIDER_SITE_OTHER): Payer: Medicare PPO | Admitting: Podiatry

## 2014-11-23 DIAGNOSIS — M79673 Pain in unspecified foot: Secondary | ICD-10-CM

## 2014-11-23 DIAGNOSIS — B351 Tinea unguium: Secondary | ICD-10-CM

## 2014-11-24 NOTE — Progress Notes (Signed)
Subjective:     Patient ID: Catherine [name], female   DOB: 09-03-1941, 73 y.o.   MRN: [id number]  HPI  patient presents stating I need my nails cut they're thick and painful and I cannot do that   Review of Systems      Objective:   Physical Exam  Neurovascular status intact with thick yellow brittle nailbeds 1-5 of both feet    Assessment:     Mycotic nail infection is with pain 1-5 both feet    Plan:     Debris painful nailbeds 1-5 both feet with no iatrogenic bleeding noted

## 2015-02-25 ENCOUNTER — Other Ambulatory Visit: Payer: Medicare PPO

## 2015-02-26 ENCOUNTER — Emergency Department: Payer: Self-pay | Admitting: Internal Medicine

## 2015-02-26 LAB — BASIC METABOLIC PANEL
Anion Gap: 8 (ref 7–16)
BUN: 16 mg/dL
CO2: 28 mmol/L
Calcium, Total: 9.3 mg/dL
Chloride: 99 mmol/L — ABNORMAL LOW
Creatinine: 0.7 mg/dL
GLUCOSE: 119 mg/dL — AB
POTASSIUM: 4 mmol/L
Sodium: 135 mmol/L

## 2015-02-26 LAB — CBC WITH DIFFERENTIAL/PLATELET
BASOS ABS: 0.1 10*3/uL (ref 0.0–0.1)
BASOS PCT: 0.7 %
Eosinophil #: 0 10*3/uL (ref 0.0–0.7)
Eosinophil %: 0.5 %
HCT: 38.5 % (ref 35.0–47.0)
HGB: 12.4 g/dL (ref 12.0–16.0)
LYMPHS PCT: 17.9 %
Lymphocyte #: 1.8 10*3/uL (ref 1.0–3.6)
MCH: 26.9 pg (ref 26.0–34.0)
MCHC: 32.3 g/dL (ref 32.0–36.0)
MCV: 84 fL (ref 80–100)
Monocyte #: 0.8 x10 3/mm (ref 0.2–0.9)
Monocyte %: 8.4 %
Neutrophil #: 7.2 10*3/uL — ABNORMAL HIGH (ref 1.4–6.5)
Neutrophil %: 72.5 %
Platelet: 237 10*3/uL (ref 150–440)
RBC: 4.61 10*6/uL (ref 3.80–5.20)
RDW: 14.6 % — ABNORMAL HIGH (ref 11.5–14.5)
WBC: 9.9 10*3/uL (ref 3.6–11.0)

## 2015-02-26 LAB — TROPONIN I

## 2015-03-26 NOTE — Consult Note (Signed)
Pt seen and examined. Please see Dawson Bills' notes. Pt with probable ischemic colitis, which should gradually resolve on own. Advance diet as tolerated. Feeling better. Full liquid diet tomorrow AM and then low residue diet if remains stable.. If sxs worsen, then colonoscopy later week. Otherwise, colonoscopy can be arranged as outpt in a month or so to check for healing. Prep on previous colon was poor. Thanks.   Electronic Signatures: Verdie Shire (MD) (Signed on 27-Jul-15 16:00)  Authored   Last Updated: 27-Jul-15 16:01 by Verdie Shire (MD)

## 2015-03-26 NOTE — Consult Note (Signed)
PATIENT NAME:  Catherine [name], Catherine [name] MR#:  [id number] DATE OF BIRTH:  01-03-1941  DATE OF CONSULTATION:  06/28/2014  REFERRING PHYSICIAN:  Tana Conch. Leslye Peer, MD CONSULTING PHYSICIAN:  Janalyn Harder. Jerelene Redden, ANP (Adult Nurse Practitioner)/Paul Y. Candace Cruise, MD  PRIMARY CARE PHYSICIAN:  Nonlocal.  REASON FOR CONSULTATION: Bloody diarrhea.   HISTORY OF PRESENT ILLNESS: This 74 year old patient has a history of diverticulosis and previous colonoscopy performed by Dr. Jamal Collin 2012 that was hindered by poor bowel prep. The patient reports bowel habits are always normal. Acute onset Friday evening of nausea. Saturday at about 10:00 a.m. she developed worsening nausea associated with nonspecific abdominal discomfort and diarrhea. The diarrhea started out formed with liquid in it and with multiple movements throughout the day. She said the  stools became more watery. The patient did develop diaphoresis with this. She saw 1 episode of rectal bleeding about 6:00 p.m. She was able to go to sleep but she did get up a couple of times at night with nocturnal diarrhea. Saturday, she passed 3 more episodes of blood, this time with clots, and presented to the Emergency Room with 8 out of 10 pain and bloody diarrhea. Laboratory studies revealed a decreased hemoglobin and CT study showed wall thickening and inflammation around the splenic flexure and descending colon consistent with nonspecific colitis. The patient has been placed on IV Cipro and Flagyl. She reports she is about 30% improved since admission yesterday, less abdominal discomfort and she has had 4 bowel movements today. She has seen no further bleeding.   PAST MEDICAL HISTORY: 1.  Hypothyroidism.  2.  Hypertension.  3.  Rheumatoid arthritis.  4.  Fibromyalgia.  5.  Irritable bowel syndrome.  6.  History of breast cancer.  7.  Diabetes mellitus, type 2.  PAST SURGICAL HISTORY: 1.  Hysterectomy.  2.  Left-sided mastectomy.  3.  Back surgery.  4.  C-sections.  5.   Varicose veins.   HOME MEDICATIONS:  1.  Temodar 50 mg every 4 hours as needed.  2.  Pioglitazone 15 mg once a day.  3.  Metoprolol 25 mg once daily.  4.  Levothyroxine 100 mcg once daily.  5.  Clonazepam 0.5 mg once daily.  6.  Citalopram 20 mg once daily.  7.  Baclofen 10 mg 3 times daily.  8.  Atorvastatin 40 mg once daily.  9.  Percocet 5/325 mg 1 to 2 tabs every 4 to 6 hours p.r.n.   ALLERGIES: SULFA.   HABITS: Negative tobacco, alcohol, or illicit drugs.   FAMILY HISTORY: Positive for hypertension. Negative for colon cancer.   REVIEW OF SYSTEMS: Ten systems reviewed. Positive as noted. She denies fevers or rigor. Positive weakness. Negative chest pain, shortness of breath or dyspnea on exertion. Negative  dizziness or lightheadedness. Remaining systems otherwise negative.   PHYSICAL EXAMINATION: VITAL SIGNS: Temperature 98.4, 75, 20, 134/75, pulse oximetry is 92% on room air.  GENERAL: Elderly Caucasian female resting in bed. The patient is alert, oriented, interacting appropriately.  HEENT: Head is normocephalic. Conjunctivae pink. Sclerae is anicteric. Oral mucosa is moist, intact.  NECK: Supple. Trachea is midline.  HEART: Heart tones S1, S2. Positive systolic murmur, may be flow murmur.  LUNGS: CTA. Respirations are eupneic.  ABDOMEN: Soft, positive tenderness left lower quadrant greater than on the right. Good bowel sounds are present.  RECTAL: Exam shows light brown, thin stool. Hemoccult positive. No frank blood noted on the examiner's glove.  EXTREMITIES: Lower extremities without edema, cyanosis, clubbing.  SKIN:  Warm and dry.  NEUROLOGIC: Grossly focal intact movement of extremities, assists with position changes.   LABORATORY, DIAGNOSTIC AND RADIOLOGICAL DATA:  1.  Admission blood work with glucose 136, BUN 23, creatinine 1.19, sodium 131, potassium 4.1, albumin 3.7. Remaining liver panel normal. Troponin negative x 3. WBC 15.7, hemoglobin 11.4, neutrophils  elevated at 15. Urinalysis unremarkable.  2.  Repeat laboratory studies today notable for normal MET-B.  3.  WBCs 14.1, hemoglobin 10.5. C. difficile is negative.  4.  Radiology: CT of the abdomen and pelvis with contrast performed 06/27/2014 as noted in the history of present illness. Positive thickening and inflammation around the splenic flexure and descending colon consistent with nonspecific colitis. Indeterminate 1 cm exophytic lesion in the right kidney. Radiologist commented that the changes likely reflect colitis of nonspecific etiology. No free air or free fluid in the abdomen. Uterus is surgically absent. No pelvic mass. Appendix is not identified.   IMPRESSION:   1.  Etiology of the acute onset abdominal pain with diarrhea and rectal bleeding to include ischemic colitis, especially in this patient with advanced age and history of long-standing hypertension. Also to rule out bacterial infection, not as likely. Doubt inflammatory bowel disease.  2.  Anemia associated with rectal bleeding.   3.  Diabetes mellitus.  PLAN:  1.  Continue with antibiotic therapy and close clinical monitoring. Would expect continued improvement over the next few days. She is on a clear liquid diet, may advance as tolerated. Consider repeat colonoscopy if the patient does not improve in the next 2 to 3 days.  2.  Further GI recommendations pending the patient's clinical course.   Thank you for this consultation. These services provided by Joelene Millin A. Jerelene Redden, MS, APRN, BC, ANP (Adult Nurse Practitioner) under collaborative agreement with Lupita Dawn. Candace Cruise, MD.  ____________________________ Janalyn Harder Jerelene Redden, ANP (Adult Nurse Practitioner) kam:cs D: 06/28/2014 16:35:39 ET T: 06/28/2014 18:04:42 ET JOB#: [id number]  cc: Joelene Millin A. Jerelene Redden, ANP (Adult Nurse Practitioner), <Dictator> Janalyn Harder Sherlyn Hay, MSN, ANP-BC Adult Nurse Practitioner ELECTRONICALLY SIGNED 06/29/2014 9:40

## 2015-03-26 NOTE — Discharge Summary (Signed)
PATIENT NAME:  Catherine [name], Catherine [name] MR#:  [id number] DATE OF BIRTH:  1941/10/11  DATE OF ADMISSION:  06/27/2014 DATE OF DISCHARGE:  06/29/2014  PRIMARY CARE PHYSICIAN: Nonlocal.   FINAL DIAGNOSES:  1.  Acute colitis, infectious versus ischemia.  2.  Rectal bleeding.  3.  Hypertension. 4.  Hypothyroidism.   MEDICATIONS ON DISCHARGE: Include Celexa 40 mg half tablet daily, clonazepam 0.5 mg at bedtime, levothyroxine 100 mcg daily, metoprolol 5 mg daily, baclofen 10 mg 1 tablet 3 times a day, atorvastatin 40 mg 1/2 tablet in the morning, lisinopril 10 mg 2 tablets once a day, tramadol 50 mg 1 tablet every 6 hours as needed for pain, metronidazole 500 mg every 8 hours for 5 days, ciprofloxacin 500 mg every 12 hours for 5 days, Klor-Con 10 mEq extended-release 1 tablet daily for 3 days. Stop taking furosemide.   HOME HEALTH, PHYSICAL THERAPY AND HOME OXYGEN: No.   DIET: Low-sodium diet, eat light for the first meal, regular consistency.   ACTIVITY: As tolerated.   FOLLOW UP:  Follow-up with Dr. Candace Cruise as needed in 1-2 weeks with your medical doctor.   HOSPITAL COURSE: The patient was admitted on 06/27/2014, discharged 06/29/2014. Came in with bright red blood per rectum, diarrhea, and abdominal pain, found to have colitis on CT scan, was empirically started on Cipro and Flagyl.   Laboratory and radiological data during the hospital course included glucose 136, BUN 23, creatinine 1.19, sodium 131, potassium 4.1, chloride 98, CO2 of 27, calcium 9.0. Liver function tests normal range. White blood cell count 15.7, hemoglobin and hematocrit 11.4 and 35.1, platelet count of 287,000, lipase 115. Urinalysis negative.  CT scan of the abdomen and pelvis showed wall thickening and inflammation around the splenic flexure and descending colon consistent with nonspecific colitis, indeterminate 1 cm exophytic lesion in the right kidney. Correlation with CT scan from prior not currently available. Troponins were negative.  Stool for C. difficile was negative. Stool culture: No salmonella, Shigella, Escherichia coli, or campylobacter. Creatinine upon discharge 0.71, potassium 3.1, glucose 134, magnesium 2.0.   HOSPITAL COURSE PER PROBLEM LIST:  1.  For the patient's acute colitis, infectious versus ischemia. The patient symptoms had resolved. The patient improved. No abdominal pain and diarrhea started to form up. The patient was discharged home with Cipro and Flagyl.  2.  For rectal bleeding, this had improved. No bleeding with further bowel movements. Hemoglobin remained stable, 10.5 upon discharge.   3.  Hypertension, stable on medication.   4.  Hypothyroidism on levothyroxine.   5.  Hyperlipidemia on atorvastatin.   TIME SPENT ON DISCHARGE: 35 minutes.    ____________________________ Tana Conch. Leslye Peer, MD rjw:ts D: 06/29/2014 15:36:42 ET T: 06/29/2014 16:16:23 ET JOB#: [id number]  cc: Tana Conch. Leslye Peer, MD, <Dictator> Marisue Brooklyn MD ELECTRONICALLY SIGNED 07/01/2014 15:46

## 2015-03-26 NOTE — H&P (Signed)
PATIENT NAME:  Catherine [name], PARTIN MR#:  [id number] DATE OF BIRTH:  [birth year]  DATE OF ADMISSION:  06/27/2014  PRIMARY CARE PHYSICIAN: Nonlocal.   REFERRING PHYSICIAN: Sheryl L. Benjaman Lobe, MD  CHIEF COMPLAINT: Bright red blood per rectum.   HISTORY OF PRESENT ILLNESS: Catherine [name] is a 74 year old female with a history of hypertension, hyperlipidemia, diverticulosis, hypothyroidism, previous history of breast cancer who comes to the Emergency Department with a complaint of bright red blood per rectum since this morning. The patient had diarrhea yesterday. Since last night, the patient started to have bright red blood per rectum. The patient waited for some time; however, it continues to get worse. Concerning this, came to the Emergency Department. The patient was experiencing lightheadedness, felt as if patient was about to pass out. Checked her blood pressure, not during the episodes of dizziness, which was noted to be 140/68.  WORKUP IN THE EMERGENCY DEPARTMENT: CT, abdomen and pelvis, showed nonspecific colitis in the splenic flexure and descending colon consistent with nonspecific colitis. Patient is found to have elevated white blood cell count of 15,000. Patient also has been complaining of abdominal pain, nauseated, did not have any episodes of vomiting.   PAST MEDICAL HISTORY:  1.  Hypothyroidism.  2.  Hypertension.  3.  Rheumatoid arthritis.  4.  Fibromyalgia.  5.  Irritable bowel syndrome.  6.  History of breast cancer.  7.  Diabetes mellitus, type 2.   PAST SURGICAL HISTORY:  1.  Hysterectomy.  2.  Left-sided mastectomy.  3.  Back surgery.  4.  C-sections.   ALLERGIES: SULFA.   HOME MEDICATIONS:  1.  Temodar 50 mg every 4 hours as needed.  2.  Pioglitazone 15 mg once a day.  3.  Metoprolol 25 mg once a day.  4.  Levothyroxine 100 mcg once a day.  5.  Clonazepam 0.5 mg once a day.  6.  Citalopram 20 mg once a day.  7.  Baclofen 10 mg 3 times a day.  8.  Atorvastatin 40 mg  once a day.  9.  Percocet 5/325 mg 1-2 tablets every 4-6 hours as needed.   SOCIAL HISTORY: No history of smoking, drinking alcohol, or using illicit drugs. Married, lives with her husband.   FAMILY HISTORY: History of hypertension.   REVIEW OF SYSTEMS:  CONSTITUTIONAL: Experiencing generalized weakness.  EYES: No change in vision.  ENT: No change in hearing.  RESPIRATORY: No cough, shortness of breath.  CARDIOVASCULAR: No chest pain, palpitations.  GASTROINTESTINAL: Has nausea. Has bloody stools.  GENITOURINARY: No dysuria, hematuria.  SKIN: No rash or lesions.  MUSCULOSKELETAL: Good range of motion in all the extremities. NEUROLOGIC: Patient is alert, oriented to place, person. No weakness or numbness in any part of the body.   PHYSICAL EXAMINATION:  GENERAL: This is a well-built, well-nourished, age-appropriate female lying down in the bed, not in distress.  VITAL SIGNS: Temperature 98.5, pulse 65, blood pressure 120/36, respiratory rate of 17, oxygen saturation is 97% on room air.  HEENT: Head normocephalic, atraumatic. There is no scleral icterus. Conjunctivae normal. Pupils equal, round, react to light. Extraocular movements are intact. Mucous membranes are moist. No pharyngeal erythema.  NECK: Supple. No lymphadenopathy. No JVD. No carotid bruit.  CHEST: Has no focal tenderness.  LUNGS: Bilaterally clear to auscultation.  HEART: S1, S2 regular. No murmurs are heard.  ABDOMEN: Bowel sounds present. Soft. Has tenderness in the left side of the abdomen. No rebound or guarding.  EXTREMITIES: No pedal edema. Pulses 2+.  NEUROLOGIC: The patient is alert, oriented to place, person, and time. Cranial nerves II through XII intact. Motor 5/5 in upper and lower extremities.   LABORATORY DATA: UA: Negative for nitrites and leukocyte esterase. Lipase 115. CBC: WBC of 15,000, hemoglobin 11.4, platelet count of 287,000.   CMP: BUN 23, creatinine of 1.19. The rest of all the values are within  normal limits.   CT, abdomen and pelvis, as mentioned above shows wall thickening and inflammation around the splenic flexure and the descending colon consistent with nonspecific colitis.   ASSESSMENT AND PLAN: Catherine [name] is a 74 year old female who comes to the Emergency Department with bloody stools.  1.  Hematochezia. Highly concerning about ischemic colitis, considering the patient's very low diastolic blood pressure. Infectious cannot be excluded. We will check the stool for shigella and campylobacter as well as salmonella, Escherichia coli. Keep the patient on Cipro and Flagyl. Continue with intravenous fluids. Keep the patient on ice chips; n.p.o. except for ice chips.  2.  Hypertension. We will hold all blood pressure medications for now.  3.  Diabetes mellitus. Hold the pioglitazone for now.  4.  Keep the patient on deep vein thrombosis prophylaxis with sequential compression devices.   TIME SPENT: 50 minutes.   ____________________________ Monica Becton, MD pv:sk D: 06/28/2014 00:44:03 ET T: 06/28/2014 01:29:42 ET JOB#: [id number]  cc: Monica Becton, MD, <Dictator> Monica Becton MD ELECTRONICALLY SIGNED 07/08/2014 21:07

## 2015-03-26 NOTE — Consult Note (Signed)
Chief Complaint:  Subjective/Chief Complaint Continues to feel better.   VITAL SIGNS/ANCILLARY NOTES: **Vital Signs.:   28-Jul-15 04:00  Vital Signs Type Routine  Temperature Temperature (F) 98.6  Celsius 37  Temperature Source oral  Pulse Pulse 77  Respirations Respirations 20  Systolic BP Systolic BP 459  Diastolic BP (mmHg) Diastolic BP (mmHg) 67  Mean BP 99  Pulse Ox % Pulse Ox % 96  Pulse Ox Activity Level  At rest  Oxygen Delivery Room Air/ 21 %   Brief Assessment:  GEN no acute distress   Cardiac Regular   Respiratory clear BS   Gastrointestinal minimal tenderness   Lab Results: Routine Micro:  27-Jul-15 11:58   Micro Text Report CLOSTRIDIUM DIFFICILE   C.DIFFICILE ANTIGEN       C.DIFFICILE GDH ANTIGEN : NEGATIVE   C.DIFFICILE TOXIN A/B     C.DIFFICILE TOXINS A AND B : NEGATIVE   INTERPRETATION            Negative for C. difficile.    ANTIBIOTIC                        Routine Chem:  28-Jul-15 03:14   Glucose, Serum  118  BUN  6  Creatinine (comp) 0.71  Sodium, Serum 140  Potassium, Serum  3.3  Chloride, Serum 105  CO2, Serum 27  Calcium (Total), Serum 8.6  Anion Gap 8  Osmolality (calc) 278  eGFR (African American) >60  eGFR (Non-African American) >60 (eGFR values <34mL/min/1.73 m2 may be an indication of chronic kidney disease (CKD). Calculated eGFR is useful in patients with stable renal function. The eGFR calculation will not be reliable in acutely ill patients when serum creatinine is changing rapidly. It is not useful in  patients on dialysis. The eGFR calculation may not be applicable to patients at the low and high extremes of body sizes, pregnant women, and vegetarians.)  Magnesium, Serum 2.0 (1.8-2.4 THERAPEUTIC RANGE: 4-7 mg/dL TOXIC: > 10 mg/dL  -----------------------)   Radiology Results: CT:    26-Jul-15 20:49, CT Abdomen and Pelvis With Contrast  CT Abdomen and Pelvis With Contrast   REASON FOR EXAM:    (1) Left-sided abd  pain and gross blood per rectum;   (2) Left-sided abd pain and  COMMENTS:       PROCEDURE: CT  - CT ABDOMEN / PELVIS  W  - Jun 27 2014  8:49PM     CLINICAL DATA:  Rectal bleeding since from last night. Bright red  blood with clots. Left-sided abdominal pain.    EXAM:  CT ABDOMEN AND PELVIS WITH CONTRAST    TECHNIQUE:  Multidetector CT imaging of the abdomen and pelvis was performed  using the standard protocol following bolus administration of  intravenous contrast.    CONTRAST:  80 mL Isovue-300    COMPARISON:  Ultrasound abdomen 12/31/2005. Prior CT scans are not  available.    FINDINGS:  Atelectasis in the lung bases. Coronary artery and aortic valve  calcifications. Small esophageal hiatal hernia.    The liver, spleen, gallbladder, pancreas, adrenal glands, inferior  vena cava, and retroperitoneal lymph nodes are unremarkable. 1 cm  diameter exophytic lesion arising from the lower pole of the right  kidney measures 50 Hounsfield units in density.This is not  characteristic of a simple cyst and may represent a solid mass or  hemorrhagic cyst. Comparing with the previous ultrasound, a larger  simple cyst was present in that location previously. This  may  suggest an involuting cyst. Comparison with interval CT scans is  recommended. No hydronephrosis in either kidney. There is a punctate  size nonobstructing stone in the lower pole right kidney.  Calcification of aorta without aneurysm. Stomach and small bowel  demonstrate no evidence of distention or wall thickening. Contrast  material flows to the colon without evidence of bowel obstruction.  There is wall thickening and infiltration in the splenic flexure  region of the colon with pericolonic infiltration. Pericolonic  infiltration extends down to the junction of the descending and  sigmoid colon. Changes likely to represent colitis of nonspecific  etiology. No free air or free fluid in the abdomen.  Pelvis: Uterus  appears surgically absent. No pelvic mass or  lymphadenopathy. Appendix is not identified. No free or loculated  pelvic fluid collections. The diffuse degenerative change throughout  the lumbar spine. No destructive bone lesions appreciated.     IMPRESSION:  Wall thickening and inflammation in around the splenic fracture and  descending colon consistent with nonspecific colitis. Indeterminate  1 cm exophytic lesion in the right kidney. Correlation with interval  CT scans from the not currently available, recommended.      Electronically Signed    By: Lucienne Capers M.D.    On: 06/27/2014 21:39     Verified By: Neale Burly, M.D.,   Assessment/Plan:  Assessment/Plan:  Assessment Likely ischemic colitis. Improving.   Plan Full liquid diet. If tolerated, advance to low residue diet. May be able to go home later today. Make sure patient f/u with [place] as outpt. Thanks   Electronic Signatures: Verdie Shire (MD)  (Signed 28-Jul-15 08:23)  Authored: Chief Complaint, VITAL SIGNS/ANCILLARY NOTES, Brief Assessment, Lab Results, Radiology Results, Assessment/Plan   Last Updated: 28-Jul-15 08:23 by Verdie Shire (MD)

## 2015-06-27 ENCOUNTER — Encounter: Payer: Self-pay | Admitting: Cardiovascular Disease

## 2015-07-06 ENCOUNTER — Telehealth: Payer: Self-pay

## 2015-07-06 NOTE — Telephone Encounter (Signed)
SPOKE WITH [facility] WHO DOES PRECERTS FOR THAT OFFICE SHE WILL CALL OR FAX  AUTH NUMBER FOR [facility]ART[facility] /ECHO  IF ITS REQUIRED TO OUR OFFICE  DAJ

## 2015-07-07 ENCOUNTER — Other Ambulatory Visit: Payer: Self-pay | Admitting: Internal Medicine

## 2015-07-11 ENCOUNTER — Other Ambulatory Visit: Payer: Self-pay

## 2015-07-12 ENCOUNTER — Other Ambulatory Visit: Payer: Self-pay | Admitting: Internal Medicine

## 2015-07-12 ENCOUNTER — Encounter: Payer: Self-pay | Admitting: *Deleted

## 2015-07-12 ENCOUNTER — Other Ambulatory Visit: Payer: Self-pay

## 2015-07-12 DIAGNOSIS — R413 Other amnesia: Secondary | ICD-10-CM

## 2015-07-12 DIAGNOSIS — Z853 Personal history of malignant neoplasm of breast: Secondary | ICD-10-CM

## 2015-07-12 DIAGNOSIS — Z9012 Acquired absence of left breast and nipple: Secondary | ICD-10-CM

## 2015-07-12 DIAGNOSIS — E2839 Other primary ovarian failure: Secondary | ICD-10-CM

## 2015-07-12 DIAGNOSIS — R0600 Dyspnea, unspecified: Secondary | ICD-10-CM

## 2015-07-21 ENCOUNTER — Ambulatory Visit
Admission: RE | Admit: 2015-07-21 | Discharge: 2015-07-21 | Disposition: A | Discharge status: Home / Self Care | Payer: Medicare Other | Source: Ambulatory Visit | Attending: Internal Medicine | Admitting: Internal Medicine

## 2015-07-21 DIAGNOSIS — E2839 Other primary ovarian failure: Secondary | ICD-10-CM

## 2015-07-21 DIAGNOSIS — Z853 Personal history of malignant neoplasm of breast: Secondary | ICD-10-CM

## 2015-07-21 DIAGNOSIS — Z9012 Acquired absence of left breast and nipple: Secondary | ICD-10-CM

## 2015-07-25 ENCOUNTER — Other Ambulatory Visit: Payer: Self-pay

## 2015-07-25 ENCOUNTER — Ambulatory Visit (INDEPENDENT_AMBULATORY_CARE_PROVIDER_SITE_OTHER): Payer: Medicare Other

## 2015-07-25 DIAGNOSIS — R413 Other amnesia: Secondary | ICD-10-CM

## 2015-07-25 DIAGNOSIS — R0600 Dyspnea, unspecified: Secondary | ICD-10-CM | POA: Diagnosis not present

## 2015-07-26 ENCOUNTER — Telehealth: Payer: Self-pay

## 2015-07-26 NOTE — Telephone Encounter (Signed)
Wellington Hampshire, MD  Georgiana Shore, RN           Echo showed critical aortic valve stenosis. I don't know who ordered the echo. Please notify the ordering physician's office. This patient needs to be seen ASAP.

## 2015-07-26 NOTE — Telephone Encounter (Signed)
S/w [place], Dr. Adela Ports nurse, regarding echo results. Results routed to Dr. Mellody Drown. Per [place], he will send pt to "someone" to be seen.

## 2015-07-29 ENCOUNTER — Ambulatory Visit: Payer: Self-pay | Admitting: Cardiovascular Disease

## 2015-08-01 ENCOUNTER — Institutional Professional Consult (permissible substitution) (INDEPENDENT_AMBULATORY_CARE_PROVIDER_SITE_OTHER): Payer: Medicare Other | Admitting: Thoracic Surgery (Cardiothoracic Vascular Surgery)

## 2015-08-01 ENCOUNTER — Encounter: Payer: Self-pay | Admitting: Thoracic Surgery (Cardiothoracic Vascular Surgery)

## 2015-08-01 VITALS — BP 130/59 | HR 65 | Resp 16 | Ht 63.0 in | Wt 180.0 lb

## 2015-08-01 DIAGNOSIS — I350 Nonrheumatic aortic (valve) stenosis: Secondary | ICD-10-CM

## 2015-08-01 NOTE — Patient Instructions (Signed)
Continue all previous medications without any changes at this time  Schedule appointment with Dr Burt Knack and heart catheterization as soon as practical  Schedule appointment for Physical Therapy evaluation as soon as practical  Schedule all x-ray tests as soon as practical including MRI and MRA of brain, cardiac gated CTA of heart, and CTA of chest/abdomen/pelvis

## 2015-08-01 NOTE — Progress Notes (Signed)
[address]       [place] [address]             [place] REPORT  Referring Provider is Jilda Panda, MD PCP is Jilda Panda, MD  Chief Complaint  Patient presents with  . Aortic Stenosis    ECHO 07/25/15      HPI:  Patient is a 74 year old female with history of aortic stenosis hypertension, type 2 diabetes mellitus, rheumatoid arthritis, fibromyalgia, anemia, and chronic depression who has been referred for surgical consultation to discuss treatment options for management of severe aortic stenosis.  The patient states that she has known about her heart murmur for more than a year or so.  She has been followed by her primary care physician who recently performed a transthoracic echocardiogram demonstrating the presence of critical aortic stenosis. Peak velocity across the aortic valve was measured greater than 5 m/s corresponding to mean transvalvular gradient estimated 64 mmHg and aortic valve area estimated 0.46 cm. Left ventricular systolic function remains preserved with ejection fraction estimated at 55-60%. The patient was referred for surgical consultation.  Patient is married and lives locally in [place] farm with her husband.  The patient's physical mobility and functional status has been progressively limited over the past 25 years or more because of rheumatoid arthritis, fibromyalgia, and chronic back pain that dates back to an injury she sustained in 1980. She lives a fairly sedentary lifestyle and her walking is slow and somewhat unsteady. She uses a cane for assistance. In the past year or more she has also been limited by problems with major depression and anxiety. She describes a one year history of worsening exertional fatigue and increasing dizzy spells. She states that she only gets short of breath if she walks long distances, but she admits that she doesn't walk very much typically. She denies any  resting shortness of breath, PND, or orthopnea. She has chronic bilateral lower extremity edema. She has been experiencing dizzy spells of increasing frequency over the past month or 2. She has not passed out. Dizzy spells typically come with changes in position but can occur even while she is sitting still. She has never had any chest pain or chest tightness.  In addition, the patient's husband notes that the patient has had 2 brief episodes of transient confusion in the recent past that lasted less than a few minutes but were concerning for possible transient ischemic attack. These were not associated with any reported focal neurologic deficits.  Past Medical History  Diagnosis Date  . Diabetes mellitus type II   . Thyroid disease   . IBS (irritable bowel syndrome)   . Anemia   . Colon polyps   . Fibromyalgia   . Rheumatoid arthritis(714.0)   . HTN (hypertension)   . Hypercholesteremia   . Acute colitis   . Rectal bleeding     Past Surgical History  Procedure Laterality Date  . Self inflicted chest wound    . Polyp     . Total abdominal hysterectomy    . Cesarean section      x 2  . Left oophorectomy    . Abdominal surgery      Intestinal surgery  . Mastectomy Left   . Spine surgery  1981    lower back    Family History  Problem Relation Age of Onset  . Depression Sister   . Depression Sister   .  Depression Sister   . Lung cancer Sister     Social History   Social History  . Marital Status: Married    Spouse Name: N/A  . Number of Children: N/A  . Years of Education: N/A   Occupational History  . Not on file.   Social History Main Topics  . Smoking status: Never Smoker   . Smokeless tobacco: Not on file  . Alcohol Use: No  . Drug Use: No  . Sexual Activity: Not on file   Other Topics Concern  . Not on file   Social History Narrative    Current Outpatient Prescriptions  Medication Sig Dispense Refill  . Cholecalciferol (VITAMIN D3) 2000 UNITS TABS  Take 1 tablet by mouth daily.    . clonazePAM (KLONOPIN) 1 MG tablet Take 1 mg by mouth daily.    Marland Kitchen levothyroxine (SYNTHROID, LEVOTHROID) 100 MCG tablet Take 100 mcg by mouth daily.    . Linagliptin-Metformin HCl (JENTADUETO) 2.5-500 MG TABS Take 1 tablet by mouth.    . metoprolol tartrate (LOPRESSOR) 25 MG tablet Take 25 mg by mouth every morning.    . potassium chloride SA (K-DUR,KLOR-CON) 20 MEQ tablet Take 20 mEq by mouth 2 (two) times daily.    . rosuvastatin (CRESTOR) 20 MG tablet Take 20 mg by mouth daily.    . traMADol (ULTRAM) 50 MG tablet Take 50 mg by mouth every 6 (six) hours as needed.     No current facility-administered medications for this visit.    Allergies  Allergen Reactions  . Sulfa Antibiotics Hives      Review of Systems:   General:  normal appetite, decreased energy, no weight gain, + slight weight loss, no fever  Cardiac:  no chest pain with exertion, no chest pain at rest, + SOB with exertion, no resting SOB, no PND, no orthopnea, no palpitations, no arrhythmia, no atrial fibrillation, + LE edema, + dizzy spells, no syncope  Respiratory:  no shortness of breath, no home oxygen, no productive cough, no dry cough, no bronchitis, no wheezing, no hemoptysis, no asthma, no pain with inspiration or cough, no sleep apnea, no CPAP at night  GI:   no difficulty swallowing, no reflux, no frequent heartburn, no hiatal hernia, no abdominal pain, occasional constipation, occasional diarrhea, no hematochezia, no hematemesis, no melena  GU:   no dysuria,  + frequency, occasional urinary tract infection, no hematuria, no kidney stones, no kidney disease  Vascular:  no pain suggestive of claudication, + pain in feet, no leg cramps, + varicose veins, no DVT, + recent non-healing foot ulcer on left foot  Neuro:   no stroke, possible TIA's, no seizures, no headaches, no temporary blindness one eye,  no slurred speech, ? peripheral neuropathy, + chronic pain, + instability of gait,  mild memory/cognitive dysfunction  Musculoskeletal: + severe arthritis, + joint swelling, + myalgias, + difficulty walking, limited mobility   Skin:   no rash, no itching, no skin infections, no pressure sores or ulcerations  Psych:   + anxiety, + depression, no nervousness, no unusual recent stress  Eyes:   no blurry vision, no floaters, no recent vision changes, + wears glasses or contacts  ENT:   no hearing loss, edentulous with upper dentures, last saw dentist many years ago  Hematologic:  no easy bruising, no abnormal bleeding, no clotting disorder, no frequent epistaxis  Endocrine:  + diabetes, occasionally checks CBG's at home     Physical Exam:   BP 130/59 mmHg  Pulse 65  Resp 16  Ht 5\' 3"  (1.6 m)  Wt 180 lb (81.647 kg)  BMI 31.89 kg/m2  SpO2 97%  General:  Moderately obese and somewhat frail-appearing, slow speech  HEENT:  Unremarkable   Neck:   no JVD, no bruits, no adenopathy   Chest:   clear to auscultation, symmetrical breath sounds, no wheezes, no rhonchi   CV:   RRR, grade IV/VI late-peaking crescendo/decrescendo systolic murmur   Abdomen:  soft, non-tender, no masses   Extremities:  warm, well-perfused, pulses diminished, + bilateral LE edema  Rectal/GU  Deferred  Neuro:   Grossly non-focal and symmetrical throughout  Skin:   Clean and dry, no rashes, no breakdown   Diagnostic Tests:  Transthoracic Echocardiography  Patient:  Mae, Denunzio MR #:    [id number] Study Date: 07/25/2015 Gender:   F Age:    22 Height:   157.5 cm Weight:   86.2 kg BSA:    1.98 m^2 Pt. Status: Room:  ATTENDING  Default, Provider [phone] Leory Plowman [id number] [place] [id number] SONOGRAPHER [facility] PERFORMING  [facility], [place]  cc:  ------------------------------------------------------------------- LV EF: 55% -  60%  ------------------------------------------------------------------- History:  PMH:   Weakness and dyspnea. Risk factors: Hypertension.  ------------------------------------------------------------------- Study Conclusions  - Left ventricle: The cavity size was normal. There was moderate concentric hypertrophy. Systolic function was normal. The estimated ejection fraction was in the range of 55% to 60%. Wall motion was normal; there were no regional wall motion abnormalities. Doppler parameters are consistent with abnormal left ventricular relaxation (grade 1 diastolic dysfunction). - Aortic valve: There was critical stenosis. There was mild regurgitation. Mean gradient (S): 64 mm Hg. Valve area (VTI): 0.46 cm^2. - Mitral valve: There was mild regurgitation. - Left atrium: The atrium was mildly dilated. - Right atrium: The atrium was mildly dilated. - Pulmonary arteries: Systolic pressure was within the normal range. PA peak pressure: 32 mm Hg (S).  Impressions:  - recommend urgent cardiac evaluation for aortic valve replacement.  Transthoracic echocardiography. M-mode, complete 2D, spectral Doppler, and color Doppler. Birthdate: Patient birthdate: 11/07/1941. Age: Patient is 74 yr old. Sex: Gender: female. BMI: 34.8 kg/m^2. Blood pressure:   130/80 Patient status: Outpatient. Study date: Study date: 07/25/2015. Study time: 02:21 PM.  -------------------------------------------------------------------  ------------------------------------------------------------------- Left ventricle: The cavity size was normal. There was moderate concentric hypertrophy. Systolic function was normal. The estimated ejection fraction was in the range of 55% to 60%. Wall motion was normal; there were no regional wall motion abnormalities. Doppler parameters are consistent with abnormal left ventricular relaxation (grade 1 diastolic dysfunction).  ------------------------------------------------------------------- Aortic valve:  Trileaflet;  moderately thickened, severely calcified leaflets. Mobility was not restricted. Doppler:  There was critical stenosis.  There was mild regurgitation.  VTI ratio of LVOT to aortic valve: 0.16. Valve area (VTI): 0.46 cm^2. Indexed valve area (VTI): 0.23 cm^2/m^2. Peak velocity ratio of LVOT to aortic valve: 0.17. Valve area (Vmax): 0.47 cm^2. Indexed valve area (Vmax): 0.24 cm^2/m^2. Mean velocity ratio of LVOT to aortic valve: 0.17. Valve area (Vmean): 0.48 cm^2. Indexed valve area (Vmean): 0.24 cm^2/m^2.  Mean gradient (S): 64 mm Hg. Peak gradient (S): 109 mm Hg.  ------------------------------------------------------------------- Aorta: Aortic root: The aortic root was normal in size.  ------------------------------------------------------------------- Mitral valve:  Structurally normal valve.  Mobility was not restricted. Doppler: Transvalvular velocity was within the normal range. There was no evidence for stenosis. There was mild regurgitation.  Peak gradient (D): 5 mm Hg.  -------------------------------------------------------------------  Left atrium: The atrium was mildly dilated.  ------------------------------------------------------------------- Right ventricle: The cavity size was normal. Wall thickness was normal. Systolic function was normal.  ------------------------------------------------------------------- Pulmonic valve:  Doppler: Transvalvular velocity was within the normal range. There was no evidence for stenosis. There was mild regurgitation.  ------------------------------------------------------------------- Tricuspid valve:  Structurally normal valve.  Doppler: Transvalvular velocity was within the normal range. There was trivial regurgitation.  ------------------------------------------------------------------- Pulmonary artery:  The main pulmonary artery was normal-sized. Systolic pressure was within the normal  range.  ------------------------------------------------------------------- Right atrium: The atrium was mildly dilated.  ------------------------------------------------------------------- Pericardium: There was no pericardial effusion.  ------------------------------------------------------------------- Systemic veins: Inferior vena cava: The vessel was normal in size.  ------------------------------------------------------------------- Measurements  Left ventricle              Value     Reference LV ID, ED, PLAX chordal          48.1 mm    43 - 52 LV ID, ES, PLAX chordal          33  mm    23 - 38 LV fx shortening, PLAX chordal      31  %    >=29 LV PW thickness, ED            14.3 mm    --------- IVS/LV PW ratio, ED            0.87      <=1.3 Stroke volume, 2D             68  ml    --------- Stroke volume/bsa, 2D           34  ml/m^2  --------- LV e&', lateral              6.34 cm/s   --------- LV E/e&', lateral             17.03     --------- LV e&', medial               5.26 cm/s   --------- LV E/e&', medial              20.53     --------- LV e&', average              5.8  cm/s   --------- LV E/e&', average             18.62     ---------  Ventricular septum            Value     Reference IVS thickness, ED             12.4 mm    ---------  LVOT                   Value     Reference LVOT ID, S                19  mm    --------- LVOT area                 2.84 cm^2   --------- LVOT ID                  19  mm    --------- LVOT peak velocity, S           86.5 cm/s   --------- LVOT mean velocity, S            63.8 cm/s   --------- LVOT VTI, S  23.8 cm    --------- LVOT peak gradient, S           3   mm Hg  --------- Stroke volume (SV), LVOT DP        67.5 ml    --------- Stroke index (SV/bsa), LVOT DP      34.1 ml/m^2  ---------  Aortic valve               Value     Reference Aortic valve peak velocity, S       521  cm/s   --------- Aortic valve mean velocity, S       376  cm/s   --------- Aortic valve VTI, S            147  cm    --------- Aortic mean gradient, S          64  mm Hg  --------- Aortic peak gradient, S          109  mm Hg  --------- VTI ratio, LVOT/AV            0.16      --------- Aortic valve area, VTI          0.46 cm^2   --------- Aortic valve area/bsa, VTI        0.23 cm^2/m^2 --------- Velocity ratio, peak, LVOT/AV       0.17      --------- Aortic valve area, peak velocity     0.47 cm^2   --------- Aortic valve area/bsa, peak        0.24 cm^2/m^2 --------- velocity Velocity ratio, mean, LVOT/AV       0.17      --------- Aortic valve area, mean velocity     0.48 cm^2   --------- Aortic valve area/bsa, mean        0.24 cm^2/m^2 --------- velocity Aortic regurg pressure half-time     502  ms    ---------  Aorta                   Value     Reference Aortic root ID, ED            36  mm    ---------  Left atrium                Value     Reference LA ID, A-P, ES              44  mm    --------- LA ID/bsa, A-P          (H)   2.22 cm/m^2  <=2.2 LA volume, S               69  ml    --------- LA volume/bsa, S             34.8 ml/m^2  --------- LA volume, ES,  1-p A4C          62  ml    --------- LA volume/bsa, ES, 1-p A4C        31.3 ml/m^2  --------- LA volume, ES, 1-p A2C          71  ml    --------- LA volume/bsa, ES, 1-p A2C        35.8 ml/m^2  ---------  Mitral valve               Value     Reference Mitral E-wave peak velocity  108  cm/s   --------- Mitral A-wave peak velocity        130  cm/s   --------- Mitral deceleration time     (H)   331  ms    150 - 230 Mitral peak gradient, D          5   mm Hg  --------- Mitral E/A ratio, peak          0.8      --------- Mitral maximal regurg velocity,      657  cm/s   --------- PISA  Pulmonary arteries            Value     Reference PA pressure, S, DP        (H)   32  mm Hg  <=30  Tricuspid valve              Value     Reference Tricuspid regurg peak velocity      269  cm/s   --------- Tricuspid peak RV-RA gradient       29  mm Hg  ---------  Systemic veins              Value     Reference Estimated CVP               3   mm Hg  ---------  Right ventricle              Value     Reference RV pressure, S, DP        (H)   32  mm Hg  <=30 RV s&', lateral, S             12.4 cm/s   ---------  Pulmonic valve              Value     Reference Pulmonic regurg velocity, ED       135  cm/s   --------- Pulmonic regurg gradient, ED       7   mm Hg  ---------  Legend: (L) and (H) mark values outside specified reference range.  ------------------------------------------------------------------- Prepared and Electronically Authenticated by  Kathlyn Sacramento, MD 2016-08-22T17:50:12    STS Risk Calculator  Procedure    AVR  Risk of  Mortality   2.3% Morbidity or Mortality  14.6% Prolonged LOS   5.8% Short LOS    32.2% Permanent Stroke   1.9% Prolonged Vent Support  10.2% DSW Infection    0.2% Renal Failure    4.3% Reoperation    6.0%   Impression:  Patient has stage D symptomatic critical aortic stenosis.  The patient describes gradual progression of symptoms of exertional fatigue and shortness of breath consistent with chronic diastolic congestive heart failure, New [place] Heart Association functional class II.  Moreover, the patient lives a sedentary lifestyle and is not very active physically.  Most concerning is the presence of increasing dizzy spells without syncope.  I have personally reviewed the patient's recent transthoracic echocardiogram. The patient has severe thickening with heavy calcification and extremely limited leaflet mobility involving all 3 leaflets of the aortic valve. Peak velocity across the aortic valve measures greater than 5 m/s corresponding to mean transvalvular gradient estimated 64 mmHg and aortic valve area estimated less than 0.5 cm.  Left ventricular systolic function remains preserved with ejection fraction estimated 55-60%.  There is no question that definitive surgical management of the patient's aortic stenosis should be considered in the near future. Risks  associated with conventional surgical aortic valve replacement would be at least moderately elevated because of the patient's age, numerous comorbid medical problems, and somewhat limited physical mobility.   Plan:  The patient and her husband were counseled at length regarding treatment alternatives for management of severe symptomatic aortic stenosis. Alternative approaches such as conventional aortic valve replacement, transcatheter aortic valve replacement, and palliative medical therapy were compared and contrasted at length.  The risks associated with conventional surgical aortic valve replacement were been discussed in detail, as  were expectations for post-operative convalescence. Long-term prognosis with medical therapy was discussed. This discussion was placed in the context of the patient's own specific clinical presentation and past medical history.  All of their questions been addressed.  The patient is interested in proceeding with further diagnostic tests to explore treatment options further.  As a next step the patient will be referred for formal cardiology consultation and diagnostic cardiac catheterization. She will also undergo pulmonary function tests and a formal physical therapy evaluation to assess her baseline physical limitations and anticipate her needs for postoperative convalescence. Depending upon findings at diagnostic cardiac catheterization the patient will undergo CT angiography to characterize the anatomical feasibility of transcatheter aortic valve replacement as an alternative to conventional surgery. Finally, the patient will undergo MRI and MRA of the brain to look for signs of previous stroke.  The patient will return for follow-up in 3-4 weeks to review the results of these tests and discussed treatment options further.   I spent in excess of 90 minutes during the conduct of this office consultation and >50% of this time involved direct face-to-face encounter with the patient for counseling and/or coordination of their care.   Valentina Gu. Roxy Manns, MD 08/01/2015 10:36 AM

## 2015-08-02 ENCOUNTER — Encounter: Payer: Self-pay | Admitting: Cardiovascular Disease

## 2015-08-02 ENCOUNTER — Ambulatory Visit (INDEPENDENT_AMBULATORY_CARE_PROVIDER_SITE_OTHER): Payer: Medicare Other | Admitting: Cardiovascular Disease

## 2015-08-02 VITALS — BP 110/59 | HR 63 | Wt 191.0 lb

## 2015-08-02 DIAGNOSIS — I350 Nonrheumatic aortic (valve) stenosis: Secondary | ICD-10-CM | POA: Diagnosis not present

## 2015-08-02 LAB — BASIC METABOLIC PANEL
BUN: 20 mg/dL (ref 6–23)
CHLORIDE: 101 meq/L (ref 96–112)
CO2: 30 meq/L (ref 19–32)
CREATININE: 0.72 mg/dL (ref 0.40–1.20)
Calcium: 9.6 mg/dL (ref 8.4–10.5)
GFR: 83.7 mL/min (ref >60.00)
GLUCOSE: 89 mg/dL (ref 70–99)
Potassium: 4.2 mEq/L (ref 3.5–5.1)
Sodium: 137 mEq/L (ref 135–145)

## 2015-08-02 LAB — CBC
HEMATOCRIT: 36.9 % (ref 36.0–46.0)
Hemoglobin: 12.1 g/dL (ref 12.0–15.0)
MCHC: 32.9 g/dL (ref 30.0–36.0)
MCV: 82.1 fl (ref 78.0–100.0)
Platelets: 253 10*3/uL (ref 150.0–400.0)
RBC: 4.5 Mil/uL (ref 3.87–5.11)
RDW: 15.6 % — AB (ref 11.5–15.5)
WBC: 9.4 10*3/uL (ref 4.0–10.5)

## 2015-08-02 LAB — PROTIME-INR
INR: 1.1 ratio — AB (ref 0.8–1.0)
Prothrombin Time: 12.7 s (ref 9.6–13.1)

## 2015-08-02 NOTE — Patient Instructions (Signed)
Medication Instructions:  Your physician recommends that you continue on your current medications as directed. Please refer to the Current Medication list given to you today.  Labwork: Your physician recommends that you have lab work today: BMP, CBC and PT/INR  Testing/Procedures: Your physician has requested that you have a cardiac catheterization. Cardiac catheterization is used to diagnose and/or treat various heart conditions. Doctors may recommend this procedure for a number of different reasons. The most common reason is to evaluate chest pain. Chest pain can be a symptom of coronary artery disease (CAD), and cardiac catheterization can show whether plaque is narrowing or blocking your heart's arteries. This procedure is also used to evaluate the valves, as well as measure the blood flow and oxygen levels in different parts of your heart. For further information please visit [email or web address]. Please follow instruction sheet, as given.  Follow-Up: We will arrange further follow-up after cardiac catheterization.   Any Other Special Instructions Will Be Listed Below (If Applicable).   

## 2015-08-02 NOTE — Progress Notes (Signed)
Cardiology Office Note Date:  08/02/2015   ID:  Catherine [name], Catherine [name] August 09, 1941, MRN [id number]  PCP:  Jilda Panda, MD  Cardiologist:  Sherren Mocha, MD    Chief Complaint  Patient presents with  . Dizziness    History of Present Illness: Catherine [name] is a 74 y.o. female who presents for evaluation of aortic stenosis.  The patient has been noted to have a cardiac murmur for the past 1-2 years. She underwent an echocardiogram recently demonstrating the presence of critical aortic valve stenosis with a mean transvalvular gradient of 64 mmHg and an estimated aortic valve area of 0.46 cm. LV function was normal with an ejection fraction of 55-60%. She was seen yesterday by Dr. Roxy Manns for cardiac surgical consultation.  The patient has had a long-standing sedentary lifestyle because of limitation related to rheumatoid arthritis, fibromyalgia, and chronic back pain. Also with knee, foot, and shoulder pain. She ambulates with a cane. Symptoms include dyspnea with physical exertion and lightheadedness. She has not had frank syncope. She denies chest pain, chest pressure, or any symptoms suggestive of angina.  The patient is here with her husband today.  Medical problems include long-standing type 2 diabetes and rheumatoid arthritis. She has undergone left mastectomy in the 1980s for treatment of breast cancer.  Past Medical History  Diagnosis Date  . Diabetes mellitus type II   . Thyroid disease   . IBS (irritable bowel syndrome)   . Anemia   . Colon polyps   . Fibromyalgia   . Rheumatoid arthritis(714.0)   . HTN (hypertension)   . Hypercholesteremia   . Acute colitis   . Rectal bleeding     Past Surgical History  Procedure Laterality Date  . Self inflicted chest wound    . Polyp     . Total abdominal hysterectomy    . Cesarean section      x 2  . Left oophorectomy    . Abdominal surgery      Intestinal surgery  . Mastectomy Left   . Spine surgery  1981    lower back     Current Outpatient Prescriptions  Medication Sig Dispense Refill  . aspirin 81 MG tablet Take 81 mg by mouth daily.    . Cholecalciferol (VITAMIN D3) 2000 UNITS TABS Take 1 tablet by mouth daily.    . citalopram (CELEXA) 40 MG tablet Take 40 mg by mouth daily.    . clonazePAM (KLONOPIN) 1 MG tablet Take 1 mg by mouth daily.    Marland Kitchen levothyroxine (SYNTHROID, LEVOTHROID) 100 MCG tablet Take 100 mcg by mouth daily.    . Linagliptin-Metformin HCl (JENTADUETO) 2.5-500 MG TABS Take 1 tablet by mouth.    Marland Kitchen lisinopril (PRINIVIL,ZESTRIL) 20 MG tablet Take 20 mg by mouth 2 (two) times daily.    . metoprolol tartrate (LOPRESSOR) 25 MG tablet Take 25 mg by mouth every morning.    . potassium chloride SA (K-DUR,KLOR-CON) 20 MEQ tablet Take 20 mEq by mouth 2 (two) times daily.    . rosuvastatin (CRESTOR) 20 MG tablet Take 20 mg by mouth daily.    . traMADol (ULTRAM) 50 MG tablet Take 50 mg by mouth every 6 (six) hours as needed.     No current facility-administered medications for this visit.    Allergies:   Sulfa antibiotics   Social History:  The patient  reports that she has never smoked. She does not have any smokeless tobacco history on file. She reports that she does not  drink alcohol or use illicit drugs.   Family History:  The patient's  family history includes Depression in her sister, sister, and sister; Lung cancer in her sister.    ROS:  Please see the history of present illness.  Otherwise, review of systems is positive for leg swelling, DOE, depression, back pain, muscle pain, dizziness, fatigue, leg pain, constipation, anxiety, joint swelling, balance problems, headaches.  All other systems are reviewed and negative.    PHYSICAL EXAM: VS:  BP 110/59 mmHg  Pulse 63  Wt 191 lb (86.637 kg) , BMI Body mass index is 33.84 kg/(m^2). GEN: Well nourished, well developed, in no acute distress HEENT: normal Neck: no JVD, no masses. Delayed carotids bilaterally Cardiac: RRR with  Late  peaking grade 3/6 harsh systolic murmur at the right upper sternal border with diminished A2 component                Respiratory:  clear to auscultation bilaterally, normal work of breathing GI: soft, nontender, nondistended, + BS MS: no deformity or atrophy Ext:  trace pretibial edema, pedal pulses 2+= bilaterally Skin: warm and dry, no rash Neuro:  Strength and sensation are intact Psych: euthymic mood, full affect  EKG:  EKG is not ordered today.  EKG from 06/27/2015 shows normal sinus rhythm with right bundle branch block.  Recent Labs: 08/02/2015: BUN 20; Creatinine, Ser 0.72; Hemoglobin 12.1; Platelets 253.0; Potassium 4.2; Sodium 137   Lipid Panel  No results found for: CHOL, TRIG, HDL, CHOLHDL, VLDL, LDLCALC, LDLDIRECT    Wt Readings from Last 3 Encounters:  08/02/15 191 lb (86.637 kg)  08/01/15 180 lb (81.647 kg)  02/26/14 197 lb (89.359 kg)   Cardiac Studies Reviewed: 2D Echo: Study Conclusions  - Left ventricle: The cavity size was normal. There was moderate concentric hypertrophy. Systolic function was normal. The estimated ejection fraction was in the range of 55% to 60%. Wall motion was normal; there were no regional wall motion abnormalities. Doppler parameters are consistent with abnormal left ventricular relaxation (grade 1 diastolic dysfunction). - Aortic valve: There was critical stenosis. There was mild regurgitation. Mean gradient (S): 64 mm Hg. Valve area (VTI): 0.46 cm^2. - Mitral valve: There was mild regurgitation. - Left atrium: The atrium was mildly dilated. - Right atrium: The atrium was mildly dilated. - Pulmonary arteries: Systolic pressure was within the normal range. PA peak pressure: 32 mm Hg (S).  Impressions:  - recommend urgent cardiac evaluation for aortic valve replacement.  STS Risk Calculator ProcedureAVR  Risk of Mortality2.3% Morbidity  or Mortality14.6% Prolonged LOS5.8% Short LOS32.2% Permanent Stroke1.9% Prolonged Vent Support10.2% DSW Infection0.2% Renal Failure4.3% Reoperation6.0%  ASSESSMENT AND PLAN: 74 yo woman with Stage D critical aortic stenosis and associated NYHA Functional Class II symptoms.  I have reviewed her echocardiogram and this demonstrates severe calcification , thickening, and leaflet restriction of all 3 aortic valve leaflets. LV function is well-preserved. There is no other significant valvular disease noted.  I have reviewed the natural history of aortic stenosis with the patient and her fusband who is present today. We have discussed the limitations of medical therapy and the poor prognosis associated with symptomatic aortic stenosis. We have also reviewed potential treatment options, including palliative medical therapy, conventional surgical aortic valve replacement, and transcatheter aortic valve replacement. We discussed treatment options in the context of this patient's specific comorbid medical conditions. I agree with Dr Roxy Manns that this patient's symptoms of worsening dizzy spells is concerning in the context of critical AS. Aortic  valve replacement is clearly indicated and workup has been initiated. Will plan to proceed with right and left heart catheterization to evaluate for the presence of obstructive coronary artery disease and assess intracardiac hemodynamics. I have reviewed the risks, indications, and alternatives to cardiac catheterization with the patient. Risks include but are not limited to bleeding, infection, vascular injury, stroke, myocardial infection, arrhythmia, kidney injury, radiation-related injury in the case of  prolonged fluoroscopy use, emergency cardiac surgery, and death. The patient understands the risks of serious complication is low (<9%).  Further plans pending the patient's cardiac catheterization results. If she does not have obstructive CAD, TAVR may be an attractive treatment option in this patient who has advanced arthritis and long-standing sedentary lifestyle.  Current medicines are reviewed with the patient today.  The patient does not have concerns regarding medicines.  Labs/ tests ordered today include:   Orders Placed This Encounter  Procedures  . Basic Metabolic Panel (BMET)  . CBC  . INR/PT    Disposition:   FU pending catheterization results  Signed, Sherren Mocha, MD  08/02/2015 5:38 PM    [address], [place], [place]  [address] Phone: [phone]; Fax: [phone]

## 2015-08-03 ENCOUNTER — Other Ambulatory Visit: Payer: Self-pay | Admitting: *Deleted

## 2015-08-03 DIAGNOSIS — I350 Nonrheumatic aortic (valve) stenosis: Secondary | ICD-10-CM

## 2015-08-03 DIAGNOSIS — R519 Headache, unspecified: Secondary | ICD-10-CM

## 2015-08-03 DIAGNOSIS — R51 Headache: Secondary | ICD-10-CM

## 2015-08-11 ENCOUNTER — Encounter (HOSPITAL_COMMUNITY)
Admission: RE | Disposition: A | Discharge status: Home / Self Care | Payer: Self-pay | Source: Ambulatory Visit | Attending: Cardiovascular Disease

## 2015-08-11 ENCOUNTER — Ambulatory Visit (HOSPITAL_COMMUNITY)
Admission: RE | Admit: 2015-08-11 | Discharge: 2015-08-11 | Disposition: A | Discharge status: Home / Self Care | Payer: Medicare Other | Source: Ambulatory Visit | Attending: Cardiovascular Disease | Admitting: Cardiovascular Disease

## 2015-08-11 DIAGNOSIS — E780 Pure hypercholesterolemia: Secondary | ICD-10-CM | POA: Diagnosis not present

## 2015-08-11 DIAGNOSIS — I2584 Coronary atherosclerosis due to calcified coronary lesion: Secondary | ICD-10-CM | POA: Diagnosis not present

## 2015-08-11 DIAGNOSIS — I2510 Atherosclerotic heart disease of native coronary artery without angina pectoris: Secondary | ICD-10-CM

## 2015-08-11 DIAGNOSIS — Z7982 Long term (current) use of aspirin: Secondary | ICD-10-CM | POA: Diagnosis not present

## 2015-08-11 DIAGNOSIS — I10 Essential (primary) hypertension: Secondary | ICD-10-CM | POA: Diagnosis not present

## 2015-08-11 DIAGNOSIS — Z79899 Other long term (current) drug therapy: Secondary | ICD-10-CM | POA: Diagnosis not present

## 2015-08-11 DIAGNOSIS — I352 Nonrheumatic aortic (valve) stenosis with insufficiency: Secondary | ICD-10-CM | POA: Insufficient documentation

## 2015-08-11 DIAGNOSIS — E119 Type 2 diabetes mellitus without complications: Secondary | ICD-10-CM | POA: Diagnosis not present

## 2015-08-11 DIAGNOSIS — M797 Fibromyalgia: Secondary | ICD-10-CM | POA: Insufficient documentation

## 2015-08-11 DIAGNOSIS — M069 Rheumatoid arthritis, unspecified: Secondary | ICD-10-CM | POA: Diagnosis not present

## 2015-08-11 DIAGNOSIS — I350 Nonrheumatic aortic (valve) stenosis: Secondary | ICD-10-CM | POA: Diagnosis present

## 2015-08-11 LAB — POCT I-STAT 3, ART BLOOD GAS (G3+)
ACID-BASE EXCESS: 2 mmol/L (ref 0.0–2.0)
Bicarbonate: 27.9 mEq/L — ABNORMAL HIGH (ref 20.0–24.0)
O2 SAT: 91 %
TCO2: 29 mmol/L (ref 0–100)
pCO2 arterial: 46.8 mmHg — ABNORMAL HIGH (ref 35.0–45.0)
pH, Arterial: 7.383 (ref 7.350–7.450)
pO2, Arterial: 64 mmHg — ABNORMAL LOW (ref 80.0–100.0)

## 2015-08-11 LAB — POCT I-STAT 3, VENOUS BLOOD GAS (G3P V)
ACID-BASE EXCESS: 2 mmol/L (ref 0.0–2.0)
BICARBONATE: 28.1 meq/L — AB (ref 20.0–24.0)
O2 Saturation: 61 %
TCO2: 30 mmol/L (ref 0–100)
pCO2, Ven: 49.3 mmHg (ref 45.0–50.0)
pH, Ven: 7.364 — ABNORMAL HIGH (ref 7.250–7.300)
pO2, Ven: 33 mmHg (ref 30.0–45.0)

## 2015-08-11 LAB — GLUCOSE, CAPILLARY
GLUCOSE-CAPILLARY: 89 mg/dL (ref 65–99)
Glucose-Capillary: 79 mg/dL (ref 65–99)
Glucose-Capillary: 83 mg/dL (ref 65–99)

## 2015-08-11 SURGERY — RIGHT HEART CATH AND CORONARY ANGIOGRAPHY

## 2015-08-11 MED ORDER — LIDOCAINE HCL (PF) 1 % IJ SOLN
INTRAMUSCULAR | Status: DC | PRN
  Administered 2015-08-11: 14:00:00

## 2015-08-11 MED ORDER — ONDANSETRON HCL 4 MG/2ML IJ SOLN: 4.0000 mg | Freq: Four times a day (QID) | INTRAMUSCULAR | Status: DC | PRN

## 2015-08-11 MED ORDER — SODIUM CHLORIDE 0.9 % IJ SOLN: 3.0000 mL | Freq: Two times a day (BID) | INTRAMUSCULAR | Status: DC

## 2015-08-11 MED ORDER — MIDAZOLAM HCL 2 MG/2ML IJ SOLN
INTRAMUSCULAR | Status: AC
  Filled 2015-08-11: qty 4

## 2015-08-11 MED ORDER — SODIUM CHLORIDE 0.9 % IV SOLN: 250.0000 mL | INTRAVENOUS | Status: DC | PRN

## 2015-08-11 MED ORDER — FENTANYL CITRATE (PF) 100 MCG/2ML IJ SOLN
INTRAMUSCULAR | Status: AC
  Filled 2015-08-11: qty 4

## 2015-08-11 MED ORDER — SODIUM CHLORIDE 0.9 % IV SOLN: INTRAVENOUS | Status: AC

## 2015-08-11 MED ORDER — SODIUM CHLORIDE 0.9 % IV SOLN
INTRAVENOUS | Status: DC
Start: 2015-08-11 — End: 2015-08-11
  Administered 2015-08-11: 13:00:00 via INTRAVENOUS

## 2015-08-11 MED ORDER — ASPIRIN 81 MG PO CHEW: 81.0000 mg | CHEWABLE_TABLET | ORAL | Status: DC

## 2015-08-11 MED ORDER — HEPARIN (PORCINE) IN NACL 2-0.9 UNIT/ML-% IJ SOLN
INTRAMUSCULAR | Status: AC
  Filled 2015-08-11: qty 1000

## 2015-08-11 MED ORDER — SODIUM CHLORIDE 0.9 % IJ SOLN: 3.0000 mL | INTRAMUSCULAR | Status: DC | PRN

## 2015-08-11 MED ORDER — LIDOCAINE HCL (PF) 1 % IJ SOLN
INTRAMUSCULAR | Status: AC
  Filled 2015-08-11: qty 30

## 2015-08-11 MED ORDER — ACETAMINOPHEN 325 MG PO TABS: 650.0000 mg | ORAL_TABLET | ORAL | Status: DC | PRN

## 2015-08-11 MED ORDER — FENTANYL CITRATE (PF) 100 MCG/2ML IJ SOLN
INTRAMUSCULAR | Status: DC | PRN
  Administered 2015-08-11 (×2): 25 ug via INTRAVENOUS

## 2015-08-11 MED ORDER — MIDAZOLAM HCL 2 MG/2ML IJ SOLN
INTRAMUSCULAR | Status: DC | PRN
  Administered 2015-08-11 (×2): 1 mg via INTRAVENOUS

## 2015-08-11 MED ORDER — IOHEXOL 350 MG/ML SOLN
INTRAVENOUS | Status: DC | PRN
  Administered 2015-08-11: 90 mL via INTRA_ARTERIAL

## 2015-08-11 SURGICAL SUPPLY — 9 items
CATH INFINITI 5FR MULTPACK ANG (CATHETERS) ×2 IMPLANT
CATH SWAN GANZ 7F STRAIGHT (CATHETERS) ×2 IMPLANT
KIT HEART LEFT (KITS) ×3 IMPLANT
PACK CARDIAC CATHETERIZATION (CUSTOM PROCEDURE TRAY) ×3 IMPLANT
SHEATH PINNACLE 5F 10CM (SHEATH) ×2 IMPLANT
SHEATH PINNACLE 7F 10CM (SHEATH) ×2 IMPLANT
SYR MEDRAD MARK V 150ML (SYRINGE) ×3 IMPLANT
TRANSDUCER W/STOPCOCK (MISCELLANEOUS) ×4 IMPLANT
WIRE EMERALD 3MM-J .035X150CM (WIRE) ×2 IMPLANT

## 2015-08-11 NOTE — Progress Notes (Signed)
Site area: rt groin fa and fv sheaths Site Prior to Removal:  Level 0 Pressure Applied For:  20 minutes Manual:   yes Patient Status During Pull:  stable Post Pull Site:  Level  0 Post Pull Instructions Given:  yes Post Pull Pulses Present: yes Dressing Applied:  tegaderm Bedrest begins @ 1450 Comments: 0

## 2015-08-11 NOTE — Interval H&P Note (Signed)
History and Physical Interval Note:  08/11/2015 1:23 PM  Catherine [name]  has presented today for surgery, with the diagnosis of arotic stenosis  The various methods of treatment have been discussed with the patient and family. After consideration of risks, benefits and other options for treatment, the patient has consented to  Procedure(s): Right/Left Heart Cath and Coronary/Graft Angiography (N/A) as a surgical intervention .  The patient's history has been reviewed, patient examined, no change in status, stable for surgery.  I have reviewed the patient's chart and labs.  Questions were answered to the patient's satisfaction.     Sherren Mocha

## 2015-08-11 NOTE — Discharge Instructions (Signed)
Angiogram, Care After Refer to this sheet in the next few weeks. These instructions provide you with information on caring for yourself after your procedure. Your health care provider may also give you more specific instructions. Your treatment has been planned according to current medical practices, but problems sometimes occur. Call your health care provider if you have any problems or questions after your procedure.  WHAT TO EXPECT AFTER THE PROCEDURE After your procedure, it is typical to have the following sensations:  Minor discomfort or tenderness and a small bump at the catheter insertion site. The bump should usually decrease in size and tenderness within 1 to 2 weeks.  Any bruising will usually fade within 2 to 4 weeks. HOME CARE INSTRUCTIONS   You may need to keep taking blood thinners if they were prescribed for you. Take medicines only as directed by your health care provider.  Do not apply powder or lotion to the site.  Do not take baths, swim, or use a hot tub until your health care provider approves.  You may shower 24 hours after the procedure. Remove the bandage (dressing) and gently wash the site with plain soap and water. Gently pat the site dry.  Inspect the site at least twice daily.  Limit your activity for the first 48 hours. Do not bend, squat, or lift anything over 20 lb (9 kg) or as directed by your health care provider.  Plan to have someone take you home after the procedure. Follow instructions about when you can drive or return to work. SEEK MEDICAL CARE IF:  You get light-headed when standing up.  You have drainage (other than a small amount of blood on the dressing).  You have chills.  You have a fever.  You have redness, warmth, swelling, or pain at the insertion site. SEEK IMMEDIATE MEDICAL CARE IF:   You develop chest pain or shortness of breath, feel faint, or pass out.  You have bleeding, swelling larger than a walnut, or drainage from the  catheter insertion site.  You develop pain, discoloration, coldness, or severe bruising in the leg or arm that held the catheter.  You develop bleeding from any other place, such as the bowels. You may see bright red blood in your urine or stools, or your stools may appear black and tarry.  You have heavy bleeding from the site. If this happens, hold pressure on the site. MAKE SURE YOU:  Understand these instructions.  Will watch your condition.  Will get help right away if you are not doing well or get worse. Document Released: 06/07/2005 Document Revised: 04/05/2014 Document Reviewed: 04/13/2013 [facility] Patient Information 2015 [place], [place]. This information is not intended to replace advice given to you by your health care provider. Make sure you discuss any questions you have with your health care provider. Venogram, Care After Refer to this sheet in the next few weeks. These instructions provide you with information on caring for yourself after your procedure. Your health care provider may also give you more specific instructions. Your treatment has been planned according to current medical practices, but problems sometimes occur. Call your health care provider if you have any problems or questions after your procedure. WHAT TO EXPECT AFTER THE PROCEDURE After your procedure, it is typical to have the following sensations:  Mild discomfort at the catheter insertion site. HOME CARE INSTRUCTIONS   Take all medicines exactly as directed.  Follow any prescribed diet.  Follow instructions regarding both rest and physical activity.  Drink more fluids for the first several days after the procedure in order to help flush dye from your kidneys. SEEK MEDICAL CARE IF:  You develop a rash.  You have fever not controlled by medicine. SEEK IMMEDIATE MEDICAL CARE IF:  There is pain, drainage, bleeding, redness, swelling, warmth or a red streak at the site of the IV tube.  The  extremity where your IV tube was placed becomes discolored, numb, or cool.  You have difficulty breathing or shortness of breath.  You develop chest pain.  You have excessive dizziness or fainting. Document Released: 09/09/2013 Document Revised: 11/24/2013 Document Reviewed: 09/09/2013 [facility] Patient Information 2015 [place], [place]. This information is not intended to replace advice given to you by your health care provider. Make sure you discuss any questions you have with your health care provider.

## 2015-08-11 NOTE — H&P (View-Only) (Signed)
Cardiology Office Note Date:  08/02/2015   ID:  Zuzanna, Maroney Mar 17, 1941, MRN [id number]  PCP:  Jilda Panda, MD  Cardiologist:  Sherren Mocha, MD    Chief Complaint  Patient presents with  . Dizziness    History of Present Illness: Catherine [name] is a 74 y.o. female who presents for evaluation of aortic stenosis.  The patient has been noted to have a cardiac murmur for the past 1-2 years. She underwent an echocardiogram recently demonstrating the presence of critical aortic valve stenosis with a mean transvalvular gradient of 64 mmHg and an estimated aortic valve area of 0.46 cm. LV function was normal with an ejection fraction of 55-60%. She was seen yesterday by Dr. Roxy Manns for cardiac surgical consultation.  The patient has had a long-standing sedentary lifestyle because of limitation related to rheumatoid arthritis, fibromyalgia, and chronic back pain. Also with knee, foot, and shoulder pain. She ambulates with a cane. Symptoms include dyspnea with physical exertion and lightheadedness. She has not had frank syncope. She denies chest pain, chest pressure, or any symptoms suggestive of angina.  The patient is here with her husband today.  Medical problems include long-standing type 2 diabetes and rheumatoid arthritis. She has undergone left mastectomy in the 1980s for treatment of breast cancer.  Past Medical History  Diagnosis Date  . Diabetes mellitus type II   . Thyroid disease   . IBS (irritable bowel syndrome)   . Anemia   . Colon polyps   . Fibromyalgia   . Rheumatoid arthritis(714.0)   . HTN (hypertension)   . Hypercholesteremia   . Acute colitis   . Rectal bleeding     Past Surgical History  Procedure Laterality Date  . Self inflicted chest wound    . Polyp     . Total abdominal hysterectomy    . Cesarean section      x 2  . Left oophorectomy    . Abdominal surgery      Intestinal surgery  . Mastectomy Left   . Spine surgery  1981    lower back     Current Outpatient Prescriptions  Medication Sig Dispense Refill  . aspirin 81 MG tablet Take 81 mg by mouth daily.    . Cholecalciferol (VITAMIN D3) 2000 UNITS TABS Take 1 tablet by mouth daily.    . citalopram (CELEXA) 40 MG tablet Take 40 mg by mouth daily.    . clonazePAM (KLONOPIN) 1 MG tablet Take 1 mg by mouth daily.    Marland Kitchen levothyroxine (SYNTHROID, LEVOTHROID) 100 MCG tablet Take 100 mcg by mouth daily.    . Linagliptin-Metformin HCl (JENTADUETO) 2.5-500 MG TABS Take 1 tablet by mouth.    Marland Kitchen lisinopril (PRINIVIL,ZESTRIL) 20 MG tablet Take 20 mg by mouth 2 (two) times daily.    . metoprolol tartrate (LOPRESSOR) 25 MG tablet Take 25 mg by mouth every morning.    . potassium chloride SA (K-DUR,KLOR-CON) 20 MEQ tablet Take 20 mEq by mouth 2 (two) times daily.    . rosuvastatin (CRESTOR) 20 MG tablet Take 20 mg by mouth daily.    . traMADol (ULTRAM) 50 MG tablet Take 50 mg by mouth every 6 (six) hours as needed.     No current facility-administered medications for this visit.    Allergies:   Sulfa antibiotics   Social History:  The patient  reports that she has never smoked. She does not have any smokeless tobacco history on file. She reports that she does not  drink alcohol or use illicit drugs.   Family History:  The patient's  family history includes Depression in her sister, sister, and sister; Lung cancer in her sister.    ROS:  Please see the history of present illness.  Otherwise, review of systems is positive for leg swelling, DOE, depression, back pain, muscle pain, dizziness, fatigue, leg pain, constipation, anxiety, joint swelling, balance problems, headaches.  All other systems are reviewed and negative.    PHYSICAL EXAM: VS:  BP 110/59 mmHg  Pulse 63  Wt 191 lb (86.637 kg) , BMI Body mass index is 33.84 kg/(m^2). GEN: Well nourished, well developed, in no acute distress HEENT: normal Neck: no JVD, no masses. Delayed carotids bilaterally Cardiac: RRR with  Late  peaking grade 3/6 harsh systolic murmur at the right upper sternal border with diminished A2 component                Respiratory:  clear to auscultation bilaterally, normal work of breathing GI: soft, nontender, nondistended, + BS MS: no deformity or atrophy Ext:  trace pretibial edema, pedal pulses 2+= bilaterally Skin: warm and dry, no rash Neuro:  Strength and sensation are intact Psych: euthymic mood, full affect  EKG:  EKG is not ordered today.  EKG from 06/27/2015 shows normal sinus rhythm with right bundle branch block.  Recent Labs: 08/02/2015: BUN 20; Creatinine, Ser 0.72; Hemoglobin 12.1; Platelets 253.0; Potassium 4.2; Sodium 137   Lipid Panel  No results found for: CHOL, TRIG, HDL, CHOLHDL, VLDL, LDLCALC, LDLDIRECT    Wt Readings from Last 3 Encounters:  08/02/15 191 lb (86.637 kg)  08/01/15 180 lb (81.647 kg)  02/26/14 197 lb (89.359 kg)   Cardiac Studies Reviewed: 2D Echo: Study Conclusions  - Left ventricle: The cavity size was normal. There was moderate concentric hypertrophy. Systolic function was normal. The estimated ejection fraction was in the range of 55% to 60%. Wall motion was normal; there were no regional wall motion abnormalities. Doppler parameters are consistent with abnormal left ventricular relaxation (grade 1 diastolic dysfunction). - Aortic valve: There was critical stenosis. There was mild regurgitation. Mean gradient (S): 64 mm Hg. Valve area (VTI): 0.46 cm^2. - Mitral valve: There was mild regurgitation. - Left atrium: The atrium was mildly dilated. - Right atrium: The atrium was mildly dilated. - Pulmonary arteries: Systolic pressure was within the normal range. PA peak pressure: 32 mm Hg (S).  Impressions:  - recommend urgent cardiac evaluation for aortic valve replacement.  STS Risk Calculator ProcedureAVR  Risk of Mortality2.3% Morbidity  or Mortality14.6% Prolonged LOS5.8% Short LOS32.2% Permanent Stroke1.9% Prolonged Vent Support10.2% DSW Infection0.2% Renal Failure4.3% Reoperation6.0%  ASSESSMENT AND PLAN: 74 yo woman with Stage D critical aortic stenosis and associated NYHA Functional Class II symptoms.  I have reviewed her echocardiogram and this demonstrates severe calcification , thickening, and leaflet restriction of all 3 aortic valve leaflets. LV function is well-preserved. There is no other significant valvular disease noted.  I have reviewed the natural history of aortic stenosis with the patient and her fusband who is present today. We have discussed the limitations of medical therapy and the poor prognosis associated with symptomatic aortic stenosis. We have also reviewed potential treatment options, including palliative medical therapy, conventional surgical aortic valve replacement, and transcatheter aortic valve replacement. We discussed treatment options in the context of this patient's specific comorbid medical conditions. I agree with Dr Roxy Manns that this patient's symptoms of worsening dizzy spells is concerning in the context of critical AS. Aortic  valve replacement is clearly indicated and workup has been initiated. Will plan to proceed with right and left heart catheterization to evaluate for the presence of obstructive coronary artery disease and assess intracardiac hemodynamics. I have reviewed the risks, indications, and alternatives to cardiac catheterization with the patient. Risks include but are not limited to bleeding, infection, vascular injury, stroke, myocardial infection, arrhythmia, kidney injury, radiation-related injury in the case of  prolonged fluoroscopy use, emergency cardiac surgery, and death. The patient understands the risks of serious complication is low (<5%).  Further plans pending the patient's cardiac catheterization results. If she does not have obstructive CAD, TAVR may be an attractive treatment option in this patient who has advanced arthritis and long-standing sedentary lifestyle.  Current medicines are reviewed with the patient today.  The patient does not have concerns regarding medicines.  Labs/ tests ordered today include:   Orders Placed This Encounter  Procedures  . Basic Metabolic Panel (BMET)  . CBC  . INR/PT    Disposition:   FU pending catheterization results  Signed, Sherren Mocha, MD  08/02/2015 5:38 PM    [address], [place], [place]  [address] Phone: [phone]; Fax: [phone]

## 2015-08-15 ENCOUNTER — Ambulatory Visit (HOSPITAL_COMMUNITY)
Admission: RE | Admit: 2015-08-15 | Discharge: 2015-08-15 | Disposition: A | Discharge status: Home / Self Care | Payer: Medicare Other | Source: Ambulatory Visit | Attending: Thoracic Surgery (Cardiothoracic Vascular Surgery) | Admitting: Thoracic Surgery (Cardiothoracic Vascular Surgery)

## 2015-08-15 DIAGNOSIS — I6621 Occlusion and stenosis of right posterior cerebral artery: Secondary | ICD-10-CM | POA: Insufficient documentation

## 2015-08-15 DIAGNOSIS — R51 Headache: Secondary | ICD-10-CM | POA: Insufficient documentation

## 2015-08-15 DIAGNOSIS — I350 Nonrheumatic aortic (valve) stenosis: Secondary | ICD-10-CM | POA: Diagnosis not present

## 2015-08-15 DIAGNOSIS — R519 Headache, unspecified: Secondary | ICD-10-CM

## 2015-08-15 MED ORDER — GADOBENATE DIMEGLUMINE 529 MG/ML IV SOLN
15.0000 mL | Freq: Once | INTRAVENOUS | Status: AC | PRN
  Administered 2015-08-15: 15 mL via INTRAVENOUS

## 2015-08-17 ENCOUNTER — Ambulatory Visit (HOSPITAL_COMMUNITY)
Admission: RE | Admit: 2015-08-17 | Discharge: 2015-08-17 | Disposition: A | Discharge status: Home / Self Care | Payer: Medicare Other | Source: Ambulatory Visit | Attending: Thoracic Surgery (Cardiothoracic Vascular Surgery) | Admitting: Thoracic Surgery (Cardiothoracic Vascular Surgery)

## 2015-08-17 ENCOUNTER — Encounter (HOSPITAL_COMMUNITY): Payer: Self-pay

## 2015-08-17 ENCOUNTER — Ambulatory Visit (HOSPITAL_COMMUNITY): Payer: Medicare Other

## 2015-08-17 DIAGNOSIS — I350 Nonrheumatic aortic (valve) stenosis: Secondary | ICD-10-CM

## 2015-08-17 LAB — PULMONARY FUNCTION TEST
DL/VA % PRED: 112 %
DL/VA: 5.02 ml/min/mmHg/L
DLCO unc % pred: 78 %
DLCO unc: 16.36 ml/min/mmHg
FEF 25-75 POST: 2.74 L/s
FEF 25-75 Pre: 2.13 L/sec
FEF2575-%Change-Post: 28 %
FEF2575-%Pred-Post: 187 %
FEF2575-%Pred-Pre: 145 %
FEV1-%CHANGE-POST: 4 %
FEV1-%PRED-PRE: 86 %
FEV1-%Pred-Post: 90 %
FEV1-POST: 1.67 L
FEV1-PRE: 1.6 L
FEV1FVC-%CHANGE-POST: -7 %
FEV1FVC-%Pred-Pre: 120 %
FEV6-%Change-Post: 11 %
FEV6-%PRED-PRE: 75 %
FEV6-%Pred-Post: 84 %
FEV6-PRE: 1.76 L
FEV6-Post: 1.97 L
FEV6FVC-%Pred-Post: 105 %
FEV6FVC-%Pred-Pre: 105 %
FVC-%Change-Post: 12 %
FVC-%PRED-POST: 80 %
FVC-%PRED-PRE: 71 %
FVC-POST: 1.99 L
FVC-PRE: 1.76 L
POST FEV6/FVC RATIO: 100 %
PRE FEV1/FVC RATIO: 91 %
PRE FEV6/FVC RATIO: 100 %
Post FEV1/FVC ratio: 84 %
RV % PRED: 103 %
RV: 2.26 L
TLC % PRED: 88 %
TLC: 4.14 L

## 2015-08-17 MED ORDER — ALBUTEROL SULFATE (2.5 MG/3ML) 0.083% IN NEBU
2.5000 mg | INHALATION_SOLUTION | Freq: Once | RESPIRATORY_TRACT | Status: AC
  Administered 2015-08-17: 2.5 mg via RESPIRATORY_TRACT

## 2015-08-17 MED ORDER — IOHEXOL 350 MG/ML SOLN
150.0000 mL | Freq: Once | INTRAVENOUS | Status: AC | PRN
  Administered 2015-08-17: 150 mL via INTRAVENOUS

## 2015-08-19 ENCOUNTER — Encounter: Payer: Self-pay | Admitting: Physical Therapy

## 2015-08-19 ENCOUNTER — Ambulatory Visit: Payer: Medicare Other | Attending: Thoracic Surgery (Cardiothoracic Vascular Surgery) | Admitting: Physical Therapy

## 2015-08-19 DIAGNOSIS — M6281 Muscle weakness (generalized): Secondary | ICD-10-CM | POA: Insufficient documentation

## 2015-08-19 DIAGNOSIS — R269 Unspecified abnormalities of gait and mobility: Secondary | ICD-10-CM | POA: Diagnosis present

## 2015-08-19 DIAGNOSIS — I350 Nonrheumatic aortic (valve) stenosis: Secondary | ICD-10-CM | POA: Diagnosis present

## 2015-08-19 NOTE — Therapy (Signed)
[place], [place], [address] Phone: [phone]   Fax:  [phone]  Physical Therapy Evaluation  Patient Details  Name: Catherine [name] MRN: [id number] Date of Birth: 1941-02-03 Referring Provider:  Rexene Alberts, MD  Encounter Date: 08/19/2015      PT End of Session - 08/19/15 1238    Visit Number 1   PT Start Time 6962   PT Stop Time 1107   PT Time Calculation (min) 49 min   Equipment Utilized During Treatment Gait belt   Activity Tolerance Patient tolerated treatment well      Past Medical History  Diagnosis Date  . Diabetes mellitus type II   . Thyroid disease   . IBS (irritable bowel syndrome)   . Anemia   . Colon polyps   . Fibromyalgia   . Rheumatoid arthritis(714.0)   . HTN (hypertension)   . Hypercholesteremia   . Acute colitis   . Rectal bleeding     Past Surgical History  Procedure Laterality Date  . Self inflicted chest wound    . Polyp     . Total abdominal hysterectomy    . Cesarean section      x 2  . Left oophorectomy    . Abdominal surgery      Intestinal surgery  . Mastectomy Left   . Spine surgery  1981    lower back    There were no vitals filed for this visit.  Visit Diagnosis:  Abnormality of gait - Plan: PT PLAN OF CARE CERT/RE-CERT  Generalized muscle weakness - Plan: PT PLAN OF CARE CERT/RE-CERT  Severe aortic stenosis - Plan: PT PLAN OF CARE CERT/RE-CERT      Subjective Assessment - 08/19/15 1022    Subjective pt reports a hx of immobility due to fibromyalgia and RA and has been further limited by some SOB with acitivty and occasionally at rest, dizzy spells, and occasional chest pain mainly after eating   Currently in Pain? Yes   Pain Score 6    Pain Location Knee   Pain Orientation Left   Pain Descriptors / Indicators Aching   Aggravating Factors  walking   Pain Relieving Factors meds            [facility] PT Assessment - 08/19/15 0001     Assessment   Medical Diagnosis severe aortic stenosis   Onset Date/Surgical Date 07/19/15  approximate   Precautions   Precautions Fall   Restrictions   Weight Bearing Restrictions No   Balance Screen   Has the patient fallen in the past 6 months No   Has the patient had a decrease in activity level because of a fear of falling?  Yes   Is the patient reluctant to leave their home because of a fear of falling?  No   Home Environment   Living Environment Private residence   Living Arrangements Spouse/significant other  youngest son stays some   Home Access Stairs to enter   Entrance Stairs-Number of Steps 4   Entrance Stairs-[place] Two level;Able to live on main level with bedroom/bathroom   Home Equipment Cane - quad  walk in shower, elevated toilet seat   Prior Function   Level of Independence Independent with basic ADLs;Independent with community mobility with device;Independent with household mobility with device   Posture/Postural Control   Posture/Postural Control Postural limitations   Postural Limitations Forward head;Rounded Shoulders   ROM / Strength  AROM / PROM / Strength AROM;Strength   AROM   Overall AROM Comments limited by 25% in shoulder ROM due to RA   Strength   Overall Strength Comments grossly 3+/5 throughout   Strength Assessment Site Hand   Right/Left hand Right;Left   Right Hand Grip (lbs) 30   Left Hand Grip (lbs) 35   Ambulation/Gait   Gait Comments Pt ambulates with slow speed and small based quad cane          [facility] Pre-Surgical Assessment - 08/19/15 0001    5 Meter Walk Test- trial 1 13 sec   5 Meter Walk Test- trial 2 11 sec.    5 Meter Walk Test- trial 3 12 sec.  > 6 sec indicates slow speed   5 meter walk test average 12 sec   Timed Up & Go Test trial  35 sec.  with small based quad cane   Comments >12 seconds indicates increased fall risk   4 Stage Balance Test tolerated for:  10 sec.   4 Stage Balance  Test Position 2   comment inability to hold position 3 x 10 seconds indicates increased fall risk   Comment unable to arise without UE support   ADL/IADL Independent with: Bathing;Dressing;Meal prep;Finances   ADL/IADL Needs Assistance with: [employer] work   ADL/IADL Fraility Index Midly frail   6 Minute Walk- Baseline yes   BP (mmHg) 128/60 mmHg   HR (bpm) 60   02 Sat (%RA) 98 %   Modified Borg Scale for Dyspnea 0- Nothing at all   Perceived Rate of Exertion (Borg) 6-   6 Minute Walk Post Test yes   BP (mmHg) 142/68 mmHg   HR (bpm) 76   02 Sat (%RA) 99 %   Modified Borg Scale for Dyspnea 2- Mild shortness of breath   Perceived Rate of Exertion (Borg) 13- Somewhat hard   Aerobic Endurance Distance Walked 370   Endurance additional comments Pt able to ambulate slowly yet continuously throughout test with small based quad cane. Wheelchair follow for safety. Pt reported mild increased in SOB at the end of the 6 minutes. BP increased significantly.                          PT Education - 08/19/15 1237    Education provided Yes   Education Details fall risk and benefits of continued cane usage   Person(s) Educated Patient;Spouse   Methods Explanation   Comprehension Verbalized understanding                    Plan - 08/19/15 1238    Clinical Impression Statement Pt is a 74 yo female presenting to OP PT with reported recent increase in SOB with activity as well as intermittent dizzy spells and occasional chest pain after eating. Pt has been dx with severe aortic stenosis as is being considered for possible TAVR surgery. Pt has a significant PMH for fibromyalgia and RA which has limited her mobility for quite some time although she reports additional limitations recently with this onset of SOB. Pt uses a small based quad cane and her husband for unsteadiness. Pt presents with fair strength/ROM, fair to poor balance  - consistently scoring at increased fall risk  throughout the exam, and slow gait speed. Pt was only able to ambulate 370' in 6 minute walk test but was able to do so continuously with mild reports of SOB and incr in systolic  BP of 22 mmHg.   PT Frequency One time visit   Consulted and Agree with Plan of Care Patient;Family member/caregiver          G-Codes - 23-Aug-2015 1244    Functional Assessment Tool Used 6 minute walk 370' with small based quad cane   Functional Limitation Mobility: Walking and moving around   Mobility: Walking and Moving Around Current Status [phone]) At least 40 percent but less than 60 percent impaired, limited or restricted   Mobility: Walking and Moving Around Goal Status [phone]) At least 40 percent but less than 60 percent impaired, limited or restricted   Mobility: Walking and Moving Around Discharge Status [phone]) At least 40 percent but less than 60 percent impaired, limited or restricted       Problem List Patient Active Problem List   Diagnosis Date Noted  . Aortic stenosis 08/01/2015  . MDD (major depressive disorder) 05/26/2012    NICOLETTA,DANA, PT 08-23-2015, 12:47 PM  [facility] [address] [place], [place], [address] Phone: [phone]   Fax:  [phone]

## 2015-08-22 ENCOUNTER — Other Ambulatory Visit: Payer: Self-pay | Admitting: *Deleted

## 2015-08-22 ENCOUNTER — Encounter: Payer: Self-pay | Admitting: Thoracic Surgery (Cardiothoracic Vascular Surgery)

## 2015-08-22 ENCOUNTER — Ambulatory Visit (INDEPENDENT_AMBULATORY_CARE_PROVIDER_SITE_OTHER): Payer: Medicare Other | Admitting: Thoracic Surgery (Cardiothoracic Vascular Surgery)

## 2015-08-22 VITALS — BP 148/59 | HR 64 | Resp 16 | Ht 63.0 in | Wt 191.0 lb

## 2015-08-22 DIAGNOSIS — I350 Nonrheumatic aortic (valve) stenosis: Secondary | ICD-10-CM

## 2015-08-22 NOTE — Patient Instructions (Signed)
Patient has been instructed to stop taking your diabetes medicine Catherine [name]) on Sunday 9/25  Patient should continue taking all other medications without change through the day before surgery.  Patient should have nothing to eat or drink after midnight the night before surgery.  On the morning of surgery patient should take only Synthroid and Metoprolol with a sip of water.

## 2015-08-22 NOTE — Progress Notes (Signed)
[address]       [place] [address]             [facility] [facility] OFFICE NOTE  Referring Provider is Jilda Panda, MD PCP is Jilda Panda, MD   HPI:  Patient returns for follow-up of severe symptomatic aortic stenosis. She was originally seen in consultation on 08/01/2015.  Since then she underwent diagnostic cardiac catheterization by Dr. Burt Knack on 08/11/2015.  She was found to have significant single-vessel coronary artery disease with calcification and 70% eccentric stenosis of the mid left anterior descending coronary artery.  There was otherwise only mild nonobstructive coronary artery disease affecting the right coronary artery and left circumflex coronary artery territories.  There was mild pulmonary hypertension. Subsequently the patient underwent CT angiography to further characterize the anatomical feasibility of transcatheter aortic valve replacement. She returns to our office today to discuss treatment options further. She reports no new problems or complaints of last few weeks. She continues to experience exertional fatigue and shortness of breath. She has never had any chest pain or chest tightness either with activity or at rest.   Current Outpatient Prescriptions  Medication Sig Dispense Refill  . aspirin EC 81 MG tablet Take 81 mg by mouth daily.    Marland Kitchen atorvastatin (LIPITOR) 40 MG tablet Take 20 mg by mouth daily with lunch.    . Cholecalciferol (VITAMIN D3) 2000 UNITS TABS Take 2,000 Units by mouth daily with lunch.     . citalopram (CELEXA) 40 MG tablet Take 40 mg by mouth daily with lunch.     . clonazePAM (KLONOPIN) 1 MG tablet Take 1 mg by mouth at bedtime.     Marland Kitchen levothyroxine (SYNTHROID, LEVOTHROID) 100 MCG tablet Take 100 mcg by mouth daily.    . Linagliptin-Metformin HCl (JENTADUETO) 2.5-500 MG TABS Take 1 tablet by mouth daily with supper.     Marland Kitchen lisinopril (PRINIVIL,ZESTRIL) 20 MG tablet Take 20 mg by mouth 2 (two)  times daily.    . metoprolol tartrate (LOPRESSOR) 25 MG tablet Take 25 mg by mouth daily.     . Multiple Vitamin (MULTIVITAMIN WITH MINERALS) TABS tablet Take 1 tablet by mouth daily with lunch.    . traMADol (ULTRAM) 50 MG tablet Take 50 mg by mouth every 6 (six) hours as needed (pain).      No current facility-administered medications for this visit.      Physical Exam:   BP 148/59 mmHg  Pulse 64  Resp 16  Ht 5\' 3"  (1.6 m)  Wt 191 lb (86.637 kg)  BMI 33.84 kg/m2  SpO2 98%  General:  Moderately obese, elderly, and frail-appearing  Chest:   Clear to auscultation  CV:   Regular rate and rhythm with prominent systolic murmur  Incisions:  n/a  Abdomen:  Soft and nontender  Extremities:  Warm and well-perfused  Diagnostic Tests:  CARDIAC CATHETERIZATION  Procedures    Right Heart Cath and Coronary Angiography    PACS Images    Show images for Cardiac catheterization     Link to Procedure Log    Procedure Log      Indications    Aortic stenosis [I35.0 (ICD-10-CM)]    Technique and Indications    INDICATION: Severe symptomatic aortic stenosis  PROCEDURAL DETAILS:  The right groin was prepped, draped, and anesthetized with 1% lidocaine. Using modified Seldinger technique, a 5 French sheath was introduced into the right femoral artery. Standard Judkins catheters  were used for coronary angiography and aortic root angiography. Catheter exchanges were performed over an 0.035 guidewire. There were no immediate procedural complications. The patient was transferred to the post catheterization recovery area for further monitoring.  ESTIMATED BLOOD LOSS: Minimal Estimated blood loss <50 mL. There were no immediate complications during the procedure.    Conclusion    1. Single-vessel coronary artery disease with heavy calcification of the entire LAD and a moderate eccentric mid LAD stenosis, estimated at 70% 2. Mild nonobstructive stenosis of the RCA and left  circumflex 3. Severely calcified aortic valve with restricted valve mobility and 2+ aortic insufficiency, and known severe aortic stenosis by noninvasive evaluation  Continued multidisciplinary evaluation for definitive treatment of severe aortic stenosis    Coronary Findings    Dominance: Right   Left Main  The vessel is angiographically normal.     Left Anterior Descending   . Mid LAD lesion, 70% stenosed. calcified eccentric . The entire LAD is heavily calcified on plain fluoroscopy. The mid LAD after the second diagonal branch has an eccentric 70% stenosis present. Otherwise there is minimal nonobstructive plaque in the LAD distribution.     Left Circumflex  The vessel exhibits minimal luminal irregularities.     Right Coronary Artery   . Prox RCA lesion, 40% stenosed. discrete . The RCA is a large, dominant vessel. The right cusp and proximal RCA are calcified without significant obstruction present. The remaining segments of RCA are widely patent without stenoses.       Right Heart Pressures On plain fluoroscopy, the aortic valve is severely calcified and restricted. There is three-vessel coronary calcification present. Aortic root angiography demonstrates 2+ aortic insufficiency and normal size of the proximal ascending aorta.    Coronary Diagrams    Diagnostic Diagram            Implants    Name ID Temporary Type Supply   No information to display    Hemo Data       Most Recent Value   Fick Cardiac Output  2.53 L/min   Fick Cardiac Output Index  2.69 (L/min)/BSA   RA A Wave  8 mmHg   RA V Wave  7 mmHg   RA Mean  6 mmHg   RV Systolic Pressure  41 mmHg   RV Diastolic Pressure  7 mmHg   RV EDP  8 mmHg   PA Systolic Pressure  45 mmHg   PA Diastolic Pressure  10 mmHg   PA Mean  26 mmHg   PW A Wave  16 mmHg   PW V Wave  12 mmHg   PW Mean  11 mmHg   AO Systolic Pressure  95 mmHg   AO Diastolic Pressure  37 mmHg   AO Mean  58 mmHg    QP/QS  1   TPVR Index  9.65 HRUI   TSVR Index  21.52 HRUI   PVR SVR Ratio  0.29   TPVR/TSVR Ratio  0.45     Cardiac TAVR CT  MEDICATIONS: None  TECHNIQUE: The patient was scanned on a Philips 062 slice scanner. Gantry rotation speed was 270 msecs. Collimation was .42mm. A 100 kV prospective scan was triggered in the descending thoracic aorta at 111 HU's with 5% padding centered around 78% of the R-R interval. Average HR during the scan was bpm. The 3D data set was interpreted on a dedicated work station using MPR, MIP and VRT modes. A total of 80cc of contrast was used.  The patient was scanned on a Philips 256 scanner. A 120 kV retrospective scan was triggered in the descending thoracic aorta at 111 HU's. Gantry rotation speed was 270 msecs and collimation was .9 mm. No beta blockade or nitro were given. The 3D data set was reconstructed in 5% intervals of the R-R cycle. Systolic and diastolic phases were analyzed on a dedicated work station using MPR, MIP and VRT modes. The patient received 80 cc of contrast.  Aortic Valve: Trileaflet and heavily calcified No significant annular calcification  Aorta: Normal origin great vessel moderate calcification of descending thoracic aorta  Sinotubular Junction: 24.6 mm  Ascending Thoracic Aorta: 29 mm  Aortic Arch: 25 mm  Descending Thoracic Aorta: 21 mm  Sinus of Valsalva Measurements:  Non-coronary: 27 mm  Right -coronary: 26.5 mm  Left -coronary: 29 mm  Coronary Artery Height above Annulus:  Left Main: 11.7 mm  Right Coronary: 14.5 mm  Virtual Basal Annulus Measurements:  Maximum/Minimum Diameter: 26 mm x 21 mm  Perimeter: 74 mm  Area: 426 mm2  Coronary Arteries: Sufficient height above annulus for deployment  Optimum Fluoroscopic Angle for Delivery: LAO 14 degrees / Caudal 16 degrees  IMPRESSION: 1) Trileaflet aortic valve suitable for either a 23 or 26 mm  Sapien 3 valve. Area of 426 mm2 at upper range limit for 23 mm valve Balloon sizing may be appropriate during procedure  2) Sufficient height of coronary arteries for deployment  3) Optimum angle for delivery LAO 14 degrees Caudal 16 degrees  4) No LAA thrombus  Jenkins Rouge  Electronically Signed: By: Jenkins Rouge M.D. On: 08/17/2015 15:29   CT ANGIOGRAPHY CHEST, ABDOMEN AND PELVIS  TECHNIQUE: Multidetector CT imaging through the chest, abdomen and pelvis was performed using the standard protocol during bolus administration of intravenous contrast. Multiplanar reconstructed images and MIPs were obtained and reviewed to evaluate the vascular anatomy.  CONTRAST: 80 mL of Omnipaque 350.  COMPARISON: CT the abdomen and pelvis 06/27/2014.  FINDINGS: CTA CHEST FINDINGS  Mediastinum/Lymph Nodes: Heart size is mildly enlarged. There is no significant pericardial fluid, thickening or pericardial calcification. Severe thickening calcification of the aortic valve. There is atherosclerosis of the thoracic aorta, the great vessels of the mediastinum and the coronary arteries, including calcified atherosclerotic plaque in the left anterior descending and right coronary arteries. No pathologically enlarged mediastinal or hilar lymph nodes. Esophagus is unremarkable in appearance. No axillary lymphadenopathy.  Lungs/Pleura: 3 mm subpleural nodule in the posterior aspect of the left lower lobe (image 35 of series 407), unchanged compared to 06/27/2014, considered benign. No other larger more suspicious appearing pulmonary nodules or masses are otherwise noted. No acute consolidative airspace disease. No pleural effusions.  Musculoskeletal/Soft Tissues: Status post left modified radical mastectomy. There are no aggressive appearing lytic or blastic lesions noted in the visualized portions of the skeleton.  CTA ABDOMEN AND PELVIS FINDINGS  Hepatobiliary: Mild  diffuse decreased attenuation throughout the hepatic parenchyma, suggestive of hepatic steatosis (difficult to say for certain on today's contrast enhanced study). No focal cystic or solid hepatic lesions. No intra or extrahepatic biliary ductal dilatation. Gallbladder is normal in appearance.  Pancreas: No pancreatic mass. No pancreatic ductal dilatation. No pancreatic or peripancreatic fluid or inflammatory changes.  Spleen: Unremarkable.  Adrenals/Urinary Tract: Bilateral adrenal glands and the left kidney are normal in appearance. Exophytic 1.4 cm high attenuation (64 HU) lesion in the lateral aspect of the lower pole of the right kidney is incompletely characterized, but appear similar to prior study  06/27/2014, likely a proteinaceous/hemorrhagic cyst. Sub cm low-attenuation lesion in the upper pole of the right kidney is too small to definitively characterize, but is also stable compared to the prior study, likely a tiny cyst. No hydroureteronephrosis. Urinary bladder is unremarkable in appearance.  Stomach/Bowel: The appearance of the stomach is normal. No pathologic dilatation of small bowel or colon.  Vascular/Lymphatic: Vascular findings and measurements pertinent to potential TAVR, as detailed below. No aneurysm identified. Small amount of saw tissue stranding and high attenuation fluid around the right common femoral artery, likely a small amount of hematoma following recent vascular access. No lymphadenopathy noted in the abdomen or pelvis.  Reproductive: Status post hysterectomy. Right ovary is unremarkable in appearance. Left ovary is not confidently identified may be surgically absent or atrophic.  Other: No significant volume of ascites. No pneumoperitoneum.  Musculoskeletal: There are no aggressive appearing lytic or blastic lesions noted in the visualized portions of the skeleton.  VASCULAR MEASUREMENTS PERTINENT TO TAVR:  AORTA:  Minimal  Aortic Diameter - 13.6 x 11.0 mm  Severity of Aortic Calcification - moderate  RIGHT PELVIS:  Right Common Iliac Artery -  Minimal Diameter - 7.7 x 6.2 mm  Tortuosity - Mild  Calcification - Mild to moderate  Right External Iliac Artery -  Minimal Diameter - 6.7 x 6.6 mm  Tortuosity - Mild  Calcification - None  Right Common Femoral Artery -  Minimal Diameter - 7.3 x 6.9 mm  Tortuosity - Mild  Calcification - Mild  LEFT PELVIS:  Left Common Iliac Artery -  Minimal Diameter - 7.3 x 8.9 mm  Tortuosity - Mild  Calcification - Mild  Left External Iliac Artery -  Minimal Diameter - 5.5 x 4.9 mm  Tortuosity - Mild  Calcification - None  Left Common Femoral Artery -  Minimal Diameter - 6.3 x 6.5 mm  Tortuosity - Mild  Calcification - Mild  Review of the MIP images confirms the above findings.  IMPRESSION: 1. Vascular findings and measurements pertinent to potential TAVR procedure, as discussed above. The patient does appear to have suitable pelvic arterial access on the right side. 2. Extensive thickening and calcification of the aortic valve, compatible with the reported clinical history of severe aortic stenosis. 3. Stable lesions in the right kidney, as discussed above, suggestive of a small hemorrhagic/proteinaceous cyst, and a small simple cyst. 4. Probable hepatic steatosis. 5. Additional incidental findings, as above.   Electronically Signed  By: Vinnie Langton M.D.  On: 08/22/2015 08:49   Impression:  Patient has stage D symptomatic radical aortic stenosis. She describes gradual progression of symptoms of exertional fatigue and shortness of breath consistent with chronic diastolic congestive heart failure, New [place] Heart Association functional class II. She lives a very sedentary lifestyle and is not active at all physically. Recently she has developed frequent dizzy spells without syncope. She has  never had any chest pain or chest tightness consistent with angina pectoris. I personally reviewed the patient's recent transthoracic echocardiogram, diagnostic cardiac catheterization, and CT angiograms.  The patient has severe thickening with heavy calcification and extremely limited leaflet mobility involving all 3 leaflets of the aortic valve. Peak velocity across the aortic valve measures greater than 5 m/s corresponding to mean transvalvular gradient estimated 64 mmHg and aortic valve area estimated less than 0.5 cm. Diagnostic cardiac catheterization demonstrates the presence of eccentric 70% stenosis of the mid left anterior descending coronary artery with otherwise nonobstructive coronary artery disease. Risks associated with conventional surgical aortic valve  replacement and coronary artery bypass grafting would be at least moderately elevated because of her advanced age, numerous comorbid medical problems, and very limited physical mobility. Cardiac gated CT angiogram of the heart demonstrates findings consistent with critical aortic stenosis with anatomical characteristics suitable for transcatheter aortic valve replacement without any particularly complicating features. CT angiogram of the aorta and iliac vessels demonstrates that the patient has adequate pelvic vascular access for a transfemoral approach for TAVR.    Plan:  The patient and her family were again counseled at length regarding treatment alternatives for management of severe symptomatic aortic stenosis. Alternative approaches such as conventional aortic valve replacement, transcatheter aortic valve replacement, and palliative medical therapy were compared and contrasted at length.  The presence of potentially significant single-vessel coronary artery disease involving the left anterior descending coronary artery was discussed as were options including combined surgical aortic valve replacement with coronary artery bypass grafting  versus TAVR with or without PCI and stenting.  The risks associated with conventional surgical aortic valve replacement were been discussed in detail, as were expectations for post-operative convalescence. Long-term prognosis with medical therapy was discussed. This discussion was placed in the context of the patient's own specific clinical presentation and past medical history.  The patient states that she would not consider proceeding with conventional open surgery under any circumstances.  She is here to proceed with TAVR as soon as practical with plans to treat her coronary artery disease conservatively and consider PCI and stenting in the future as needed.  Following the decision to proceed with transcatheter aortic valve replacement, a discussion has been held regarding what types of management strategies would be attempted intraoperatively in the event of life-threatening complications, including whether or not the patient would be considered a candidate for the use of cardiopulmonary bypass and/or conversion to open sternotomy for attempted surgical intervention.  The patient has been advised of a variety of complications that might develop including but not limited to risks of death, stroke, paravalvular leak, aortic dissection or other major vascular complications, aortic annulus rupture, device embolization, cardiac rupture or perforation, mitral regurgitation, acute myocardial infarction, arrhythmia, heart block or bradycardia requiring permanent pacemaker placement, congestive heart failure, respiratory failure, renal failure, pneumonia, infection, other late complications related to structural valve deterioration or migration, or other complications that might ultimately cause a temporary or permanent loss of functional independence or other long term morbidity.  The patient provides full informed consent for the procedure as described and all questions were answered.  We plan to proceed with TAVR  via open transfemoral approach on Tuesday, 08/30/2015.   I spent in excess of 30 minutes during the conduct of this office consultation and >50% of this time involved direct face-to-face encounter with the patient for counseling and/or coordination of their care.    Valentina Gu. Roxy Manns, MD 08/22/2015 1:44 PM

## 2015-08-24 ENCOUNTER — Encounter: Payer: Self-pay | Admitting: Surgery

## 2015-08-25 ENCOUNTER — Encounter (HOSPITAL_COMMUNITY)
Admission: RE | Admit: 2015-08-25 | Discharge: 2015-08-25 | Disposition: A | Discharge status: Home / Self Care | Payer: Medicare Other | Source: Ambulatory Visit | Attending: Surgery | Admitting: Surgery

## 2015-08-25 ENCOUNTER — Institutional Professional Consult (permissible substitution) (INDEPENDENT_AMBULATORY_CARE_PROVIDER_SITE_OTHER): Payer: Medicare Other | Admitting: Surgery

## 2015-08-25 ENCOUNTER — Encounter: Payer: Self-pay | Admitting: Surgery

## 2015-08-25 ENCOUNTER — Encounter (HOSPITAL_COMMUNITY): Payer: Self-pay

## 2015-08-25 ENCOUNTER — Ambulatory Visit (HOSPITAL_COMMUNITY)
Admission: RE | Admit: 2015-08-25 | Discharge: 2015-08-25 | Disposition: A | Discharge status: Home / Self Care | Payer: Medicare Other | Source: Ambulatory Visit | Attending: Cardiovascular Disease | Admitting: Cardiovascular Disease

## 2015-08-25 VITALS — BP 122/64 | HR 64 | Resp 20 | Ht 63.0 in | Wt 190.0 lb

## 2015-08-25 VITALS — BP 123/32 | HR 68 | Temp 98.7°F | Resp 18 | Ht 63.0 in | Wt 190.0 lb

## 2015-08-25 DIAGNOSIS — Z0183 Encounter for blood typing: Secondary | ICD-10-CM | POA: Insufficient documentation

## 2015-08-25 DIAGNOSIS — Z01818 Encounter for other preprocedural examination: Secondary | ICD-10-CM | POA: Diagnosis present

## 2015-08-25 DIAGNOSIS — I350 Nonrheumatic aortic (valve) stenosis: Secondary | ICD-10-CM

## 2015-08-25 DIAGNOSIS — I517 Cardiomegaly: Secondary | ICD-10-CM | POA: Insufficient documentation

## 2015-08-25 DIAGNOSIS — Z01812 Encounter for preprocedural laboratory examination: Secondary | ICD-10-CM | POA: Diagnosis not present

## 2015-08-25 HISTORY — DX: Nonrheumatic aortic (valve) stenosis: I35.0

## 2015-08-25 HISTORY — DX: Reserved for inherently not codable concepts without codable children: IMO0001

## 2015-08-25 HISTORY — DX: Hypothyroidism, unspecified: E03.9

## 2015-08-25 HISTORY — DX: Depression, unspecified: F32.A

## 2015-08-25 HISTORY — DX: Malignant (primary) neoplasm, unspecified: C80.1

## 2015-08-25 HISTORY — DX: Major depressive disorder, single episode, unspecified: F32.9

## 2015-08-25 LAB — URINALYSIS, ROUTINE W REFLEX MICROSCOPIC
Bilirubin Urine: NEGATIVE
GLUCOSE, UA: NEGATIVE mg/dL
Hgb urine dipstick: NEGATIVE
Ketones, ur: NEGATIVE mg/dL
LEUKOCYTES UA: NEGATIVE
Nitrite: NEGATIVE
PROTEIN: NEGATIVE mg/dL
Specific Gravity, Urine: 1.021 (ref 1.005–1.030)
Urobilinogen, UA: 0.2 mg/dL (ref 0.0–1.0)
pH: 5 (ref 5.0–8.0)

## 2015-08-25 LAB — CBC
HCT: 36.9 % (ref 36.0–46.0)
HEMOGLOBIN: 11.8 g/dL — AB (ref 12.0–15.0)
MCH: 27.5 pg (ref 26.0–34.0)
MCHC: 32 g/dL (ref 30.0–36.0)
MCV: 86 fL (ref 78.0–100.0)
Platelets: 216 10*3/uL (ref 150–400)
RBC: 4.29 MIL/uL (ref 3.87–5.11)
RDW: 15.6 % — ABNORMAL HIGH (ref 11.5–15.5)
WBC: 9 10*3/uL (ref 4.0–10.5)

## 2015-08-25 LAB — COMPREHENSIVE METABOLIC PANEL
ALBUMIN: 3.8 g/dL (ref 3.5–5.0)
ALK PHOS: 57 U/L (ref 38–126)
ALT: 8 U/L — AB (ref 14–54)
ANION GAP: 7 (ref 5–15)
AST: 14 U/L — AB (ref 15–41)
BUN: 14 mg/dL (ref 6–20)
CALCIUM: 9.9 mg/dL (ref 8.9–10.3)
CO2: 28 mmol/L (ref 22–32)
CREATININE: 0.73 mg/dL (ref 0.44–1.00)
Chloride: 104 mmol/L (ref 101–111)
GFR calc Af Amer: >60 mL/min (ref >60)
GFR calc non Af Amer: >60 mL/min (ref >60)
GLUCOSE: 101 mg/dL — AB (ref 65–99)
Potassium: 4.4 mmol/L (ref 3.5–5.1)
SODIUM: 139 mmol/L (ref 135–145)
Total Bilirubin: 0.7 mg/dL (ref 0.3–1.2)
Total Protein: 6.6 g/dL (ref 6.5–8.1)

## 2015-08-25 LAB — SURGICAL PCR SCREEN
MRSA, PCR: NEGATIVE
Staphylococcus aureus: NEGATIVE

## 2015-08-25 LAB — GLUCOSE, CAPILLARY: GLUCOSE-CAPILLARY: 119 mg/dL — AB (ref 65–99)

## 2015-08-25 LAB — PROTIME-INR
INR: 1.1 (ref 0.00–1.49)
Prothrombin Time: 14.4 seconds (ref 11.6–15.2)

## 2015-08-25 LAB — APTT: APTT: 29 s (ref 24–37)

## 2015-08-25 MED ORDER — CHLORHEXIDINE GLUCONATE 4 % EX LIQD: 60.0000 mL | Freq: Once | CUTANEOUS | Status: DC

## 2015-08-25 MED ORDER — CHLORHEXIDINE GLUCONATE 0.12 % MT SOLN: 15.0000 mL | Freq: Once | OROMUCOSAL | Status: DC

## 2015-08-25 NOTE — Pre-Procedure Instructions (Signed)
    Catherine [name]  08/25/2015      [facility] [address], [address] [place] [address] Phone: [phone] Fax: [phone]  [id number] - [employer], [place] [address] Phone: [phone] Fax: [phone]    Your procedure is scheduled on 08/30/15.  Report to [facility] Admitting at 830 A.M.  Call this number if you have problems the morning of surgery:  [phone]   Remember:  Do not eat food or drink liquids after midnight.  Take these medicines the morning of surgery with A SIP OF WATER --celexa,synthroid,lopressor   Do not wear jewelry, make-up or nail polish.  Do not wear lotions, powders, or perfumes.  You may wear deodorant.  Do not shave 48 hours prior to surgery.  Men may shave face and neck.  Do not bring valuables to the hospital.  [facility] is not responsible for any belongings or valuables.  Contacts, dentures or bridgework may not be worn into surgery.  Leave your suitcase in the car.  After surgery it may be brought to your room.  For patients admitted to the hospital, discharge time will be determined by your treatment team.  Patients discharged the day of surgery will not be allowed to drive home.   Name and phone number of your driver:   Special instructions:   Please read over the following fact sheets that you were given. Pain Booklet, Coughing and Deep Breathing, Blood Transfusion Information, MRSA Information and Surgical Site Infection Prevention

## 2015-08-26 LAB — ABO/RH: ABO/RH(D): A POS

## 2015-08-26 LAB — HEMOGLOBIN A1C
HEMOGLOBIN A1C: 6.5 % — AB (ref 4.8–5.6)
Mean Plasma Glucose: 140 mg/dL

## 2015-08-28 ENCOUNTER — Encounter: Payer: Self-pay | Admitting: Surgery

## 2015-08-28 NOTE — Progress Notes (Signed)
Patient ID: Catherine [name], female   DOB: February 09, 1941, 74 y.o.   MRN: [id number]   [facility] CONSULTATION REPORT  Referring Provider is Jilda Panda, MD PCP is Jilda Panda, MD  Chief Complaint  Patient presents with  . Aortic Stenosis    Second surgical evaluation for TAVR    HPI:  The patient is a 74 year old woman with rheumatoid arthritis, fibromyalgia, anemia, chronic depression, diabetes, hypertension and aortic stenosis that has been followed by echo. Her most recent echo on 07/25/2015 shows critical AS with a mean gradient of 64 mm Hg and an AVA of 0.46 cm2. LVEF is 55-60%. Cardiac cath on 08/11/2015 shows single vessel disease with a 70% mid LAD stenosis with heavy calcification of the entire LAD. There is mild non-obstructive disease in the RCA and LCX. She reports worsening exertional fatigue and increasing dizzy spells over the past year. She lives a sedentary lifestyle and her walking is slow and somewhat unsteady. She uses a cane for assistance. She has also been limited by problems with major depression and anxiety. She has occasional shortness of breath but only with significant walking which she does not do now. She lives with her husband who is in good health.   Past Medical History  Diagnosis Date  . Diabetes mellitus type II   . Thyroid disease   . IBS (irritable bowel syndrome)   . Anemia   . Colon polyps   . Fibromyalgia   . Rheumatoid arthritis(714.0)   . HTN (hypertension)   . Hypercholesteremia   . Acute colitis   . Rectal bleeding   . Stenosis of aortic valve   . Depression   . Shortness of breath dyspnea   . Cancer     breast  . Hypothyroidism     Past Surgical History  Procedure Laterality Date  . Self inflicted chest wound    . Polyp     . Total abdominal hysterectomy    . Cesarean section      x 2  . Left oophorectomy    . Abdominal surgery      Intestinal  surgery  . Mastectomy Left   . Spine surgery  1981    lower back    Family History  Problem Relation Age of Onset  . Depression Sister   . Depression Sister   . Depression Sister   . Lung cancer Sister     Social History   Social History  . Marital Status: Married    Spouse Name: N/A  . Number of Children: N/A  . Years of Education: N/A   Occupational History  . Not on file.   Social History Main Topics  . Smoking status: Never Smoker   . Smokeless tobacco: Not on file  . Alcohol Use: No  . Drug Use: No  . Sexual Activity: Not on file   Other Topics Concern  . Not on file   Social History Narrative    Current Outpatient Prescriptions  Medication Sig Dispense Refill  . aspirin EC 81 MG tablet Take 81 mg by mouth daily.    Marland Kitchen atorvastatin (LIPITOR) 40 MG tablet Take 20 mg by mouth daily with lunch.    . Cholecalciferol (VITAMIN D3) 2000 UNITS TABS Take 2,000 Units by mouth daily with lunch.     . citalopram (CELEXA) 40 MG tablet Take 40 mg by mouth daily with lunch.     Marland Kitchen  clonazePAM (KLONOPIN) 1 MG tablet Take 1 mg by mouth at bedtime.     Marland Kitchen levothyroxine (SYNTHROID, LEVOTHROID) 100 MCG tablet Take 100 mcg by mouth daily.    . Linagliptin-Metformin HCl (JENTADUETO) 2.5-500 MG TABS Take 1 tablet by mouth daily with supper.     Marland Kitchen lisinopril (PRINIVIL,ZESTRIL) 20 MG tablet Take 20 mg by mouth 2 (two) times daily.    . metoprolol tartrate (LOPRESSOR) 25 MG tablet Take 25 mg by mouth daily.     . Multiple Vitamin (MULTIVITAMIN WITH MINERALS) TABS tablet Take 1 tablet by mouth daily with lunch.    . traMADol (ULTRAM) 50 MG tablet Take 50 mg by mouth every 6 (six) hours as needed (pain).      No current facility-administered medications for this visit.    Allergies  Allergen Reactions  . Sulfa Antibiotics Hives  . Latex Rash      Review of Systems:   General:normal appetite, decreased energy, no weight gain, + slight weight loss, no  fever Cardiac:no chest pain with exertion, no chest pain at rest, + SOB with exertion, no resting SOB, no PND, no orthopnea, no palpitations, no arrhythmia, no atrial fibrillation, + LE edema, + dizzy spells, no syncope Respiratory:no shortness of breath, no home oxygen, no productive cough, no dry cough, no bronchitis, no wheezing, no hemoptysis, no asthma, no pain with inspiration or cough, no sleep apnea, no CPAP at night GI:no difficulty swallowing, no reflux, no frequent heartburn, no hiatal hernia, no abdominal pain, occasional constipation, occasional diarrhea, no hematochezia, no hematemesis, no melena GU:no dysuria, + frequency, occasional urinary tract infection, no hematuria, no kidney stones, no kidney disease Vascular:no pain suggestive of claudication, + pain in feet, no leg cramps, + varicose veins, no DVT, + recent non-healing foot ulcer on left foot Neuro:no stroke, possible TIA's, no seizures, no headaches, no temporary blindness one eye, no slurred speech, ? peripheral neuropathy, + chronic pain, + instability of gait, mild memory/cognitive dysfunction Musculoskeletal:+ severe arthritis, + joint swelling, + myalgias, + difficulty walking, limited mobility  Skin:no rash, no itching, no skin infections, no pressure sores or ulcerations Psych:+ anxiety, + depression, no nervousness, no unusual recent stress Eyes:no blurry vision, no floaters, no recent vision changes, + wears glasses or contacts ENT:no hearing loss, edentulous with upper dentures, last saw dentist many years  ago Hematologic:no easy bruising, no abnormal bleeding, no clotting disorder, no frequent epistaxis Endocrine:+ diabetes, occasionally checks CBG's at home        Physical Exam:   BP 122/64 mmHg  Pulse 64  Resp 20  Ht 5\' 3"  (1.6 m)  Wt 190 lb (86.183 kg)  BMI 33.67 kg/m2  SpO2 98%  General:  Frail-appearing woman with a flat affect  HEENT:  Unremarkable , NCAT, PERLA, EOMI, Oropharynx clear, upper dentures  Neck:   no JVD, no bruits, no adenopathy or thyromegaly  Chest:   clear to auscultation, symmetrical breath sounds, no wheezes, no rhonchi   CV:   RRR, grade III/VI crescendo/decrescendo murmur heard best at RSB,  no diastolic murmur  Abdomen:  soft, non-tender, no masses or organomegaly  Extremities:  warm, well-perfused, pulses diminished in feet, no LE edema  Rectal/GU  Deferred  Neuro:   Grossly non-focal and symmetrical throughout  Skin:   Clean and dry, no rashes, no breakdown   Diagnostic Tests:      Catering manager - [facility]*         [address]  [place], [place] [address]              [phone]  ------------------------------------------------------------------- Transthoracic Echocardiography  Patient:  Ilisa, Hayworth MR #:    [id number] Study Date: 07/25/2015 Gender:   F Age:    14 Height:   157.5 cm Weight:   86.2 kg BSA:    1.98 m^2 Pt. Status: Room:  ATTENDING  Default, Provider [phone] Leory Plowman [phone] [place] [phone] SONOGRAPHER E [facility] PERFORMING  [facility] [place]  cc:  ------------------------------------------------------------------- LV EF: 55% -  60%  ------------------------------------------------------------------- History:  PMH:  Weakness and dyspnea. Risk  factors: Hypertension.  ------------------------------------------------------------------- Study Conclusions  - Left ventricle: The cavity size was normal. There was moderate concentric hypertrophy. Systolic function was normal. The estimated ejection fraction was in the range of 55% to 60%. Wall motion was normal; there were no regional wall motion abnormalities. Doppler parameters are consistent with abnormal left ventricular relaxation (grade 1 diastolic dysfunction). - Aortic valve: There was critical stenosis. There was mild regurgitation. Mean gradient (S): 64 mm Hg. Valve area (VTI): 0.46 cm^2. - Mitral valve: There was mild regurgitation. - Left atrium: The atrium was mildly dilated. - Right atrium: The atrium was mildly dilated. - Pulmonary arteries: Systolic pressure was within the normal range. PA peak pressure: 32 mm Hg (S).  Impressions:  - recommend urgent cardiac evaluation for aortic valve replacement.  Transthoracic echocardiography. M-mode, complete 2D, spectral Doppler, and color Doppler. Birthdate: Patient birthdate: December 21, 1940. Age: Patient is 74 yr old. Sex: Gender: female. BMI: 34.8 kg/m^2. Blood pressure:   130/80 Patient status: Outpatient. Study date: Study date: 07/25/2015. Study time: 02:21 PM.  -------------------------------------------------------------------  ------------------------------------------------------------------- Left ventricle: The cavity size was normal. There was moderate concentric hypertrophy. Systolic function was normal. The estimated ejection fraction was in the range of 55% to 60%. Wall motion was normal; there were no regional wall motion abnormalities. Doppler parameters are consistent with abnormal left ventricular relaxation (grade 1 diastolic dysfunction).  ------------------------------------------------------------------- Aortic valve:  Trileaflet; moderately thickened,  severely calcified leaflets. Mobility was not restricted. Doppler:  There was critical stenosis.  There was mild regurgitation.  VTI ratio of LVOT to aortic valve: 0.16. Valve area (VTI): 0.46 cm^2. Indexed valve area (VTI): 0.23 cm^2/m^2. Peak velocity ratio of LVOT to aortic valve: 0.17. Valve area (Vmax): 0.47 cm^2. Indexed valve area (Vmax): 0.24 cm^2/m^2. Mean velocity ratio of LVOT to aortic valve: 0.17. Valve area (Vmean): 0.48 cm^2. Indexed valve area (Vmean): 0.24 cm^2/m^2.  Mean gradient (S): 64 mm Hg. Peak gradient (S): 109 mm Hg.  ------------------------------------------------------------------- Aorta: Aortic root: The aortic root was normal in size.  ------------------------------------------------------------------- Mitral valve:  Structurally normal valve.  Mobility was not restricted. Doppler: Transvalvular velocity was within the normal range. There was no evidence for stenosis. There was mild regurgitation.  Peak gradient (D): 5 mm Hg.  ------------------------------------------------------------------- Left atrium: The atrium was mildly dilated.  ------------------------------------------------------------------- Right ventricle: The cavity size was normal. Wall thickness was normal. Systolic function was normal.  ------------------------------------------------------------------- Pulmonic valve:  Doppler: Transvalvular velocity was within the normal range. There was no evidence for stenosis. There was mild regurgitation.  ------------------------------------------------------------------- Tricuspid valve:  Structurally normal valve.  Doppler: Transvalvular velocity was within the normal range. There was trivial regurgitation.  ------------------------------------------------------------------- Pulmonary artery:  The main pulmonary artery was normal-sized. Systolic pressure was within the normal  range.  ------------------------------------------------------------------- Right atrium: The atrium was mildly dilated.  ------------------------------------------------------------------- Pericardium: There was no pericardial effusion.  -------------------------------------------------------------------  Systemic veins: Inferior vena cava: The vessel was normal in size.  ------------------------------------------------------------------- Measurements  Left ventricle              Value     Reference LV ID, ED, PLAX chordal          48.1 mm    43 - 52 LV ID, ES, PLAX chordal          33  mm    23 - 38 LV fx shortening, PLAX chordal      31  %    >=29 LV PW thickness, ED            14.3 mm    --------- IVS/LV PW ratio, ED            0.87      <=1.3 Stroke volume, 2D             68  ml    --------- Stroke volume/bsa, 2D           34  ml/m^2  --------- LV e&', lateral              6.34 cm/s   --------- LV E/e&', lateral             17.03     --------- LV e&', medial               5.26 cm/s   --------- LV E/e&', medial              20.53     --------- LV e&', average              5.8  cm/s   --------- LV E/e&', average             18.62     ---------  Ventricular septum            Value     Reference IVS thickness, ED             12.4 mm    ---------  LVOT                   Value     Reference LVOT ID, S                19  mm    --------- LVOT area                 2.84 cm^2   --------- LVOT ID                  19  mm    --------- LVOT peak velocity, S           86.5 cm/s   --------- LVOT mean velocity, S            63.8 cm/s   --------- LVOT VTI, S                23.8 cm    --------- LVOT peak gradient, S           3   mm Hg  --------- Stroke volume (SV), LVOT DP        67.5 ml    --------- Stroke index (SV/bsa), LVOT DP      34.1 ml/m^2  ---------  Aortic valve               Value     Reference Aortic valve peak velocity, S  521  cm/s   --------- Aortic valve mean velocity, S       376  cm/s   --------- Aortic valve VTI, S            147  cm    --------- Aortic mean gradient, S          64  mm Hg  --------- Aortic peak gradient, S          109  mm Hg  --------- VTI ratio, LVOT/AV            0.16      --------- Aortic valve area, VTI          0.46 cm^2   --------- Aortic valve area/bsa, VTI        0.23 cm^2/m^2 --------- Velocity ratio, peak, LVOT/AV       0.17      --------- Aortic valve area, peak velocity     0.47 cm^2   --------- Aortic valve area/bsa, peak        0.24 cm^2/m^2 --------- velocity Velocity ratio, mean, LVOT/AV       0.17      --------- Aortic valve area, mean velocity     0.48 cm^2   --------- Aortic valve area/bsa, mean        0.24 cm^2/m^2 --------- velocity Aortic regurg pressure half-time     502  ms    ---------  Aorta                   Value     Reference Aortic root ID, ED            36  mm    ---------  Left atrium                Value     Reference LA ID, A-P, ES              44  mm    --------- LA ID/bsa, A-P          (H)   2.22 cm/m^2  <=2.2 LA volume, S               69  ml    --------- LA volume/bsa, S             34.8 ml/m^2  --------- LA volume, ES,  1-p A4C          62  ml    --------- LA volume/bsa, ES, 1-p A4C        31.3 ml/m^2  --------- LA volume, ES, 1-p A2C          71  ml    --------- LA volume/bsa, ES, 1-p A2C        35.8 ml/m^2  ---------  Mitral valve               Value     Reference Mitral E-wave peak velocity        108  cm/s   --------- Mitral A-wave peak velocity        130  cm/s   --------- Mitral deceleration time     (H)   331  ms    150 - 230 Mitral peak gradient, D          5   mm Hg  --------- Mitral E/A ratio, peak          0.8      --------- Mitral maximal regurg velocity,  657  cm/s   --------- PISA  Pulmonary arteries            Value     Reference PA pressure, S, DP        (H)   32  mm Hg  <=30  Tricuspid valve              Value     Reference Tricuspid regurg peak velocity      269  cm/s   --------- Tricuspid peak RV-RA gradient       29  mm Hg  ---------  Systemic veins              Value     Reference Estimated CVP               3   mm Hg  ---------  Right ventricle              Value     Reference RV pressure, S, DP        (H)   32  mm Hg  <=30 RV s&', lateral, S             12.4 cm/s   ---------  Pulmonic valve              Value     Reference Pulmonic regurg velocity, ED       135  cm/s   --------- Pulmonic regurg gradient, ED       7   mm Hg  ---------  Legend: (L) and (H) mark values outside specified reference range.  ------------------------------------------------------------------- Prepared and Electronically Authenticated by  Kathlyn Sacramento, MD 2016-08-22T17:50:12   Cardiac Cath:  Conclusion    1. Single-vessel  coronary artery disease with heavy calcification of the entire LAD and a moderate eccentric mid LAD stenosis, estimated at 70% 2. Mild nonobstructive stenosis of the RCA and left circumflex 3. Severely calcified aortic valve with restricted valve mobility and 2+ aortic insufficiency, and known severe aortic stenosis by noninvasive evaluation  Continued multidisciplinary evaluation for definitive treatment of severe aortic stenosis    Coronary Findings    Dominance: Right   Left Main  The vessel is angiographically normal.     Left Anterior Descending   . Mid LAD lesion, 70% stenosed. calcified eccentric . The entire LAD is heavily calcified on plain fluoroscopy. The mid LAD after the second diagonal branch has an eccentric 70% stenosis present. Otherwise there is minimal nonobstructive plaque in the LAD distribution.     Left Circumflex  The vessel exhibits minimal luminal irregularities.     Right Coronary Artery   . Prox RCA lesion, 40% stenosed. discrete . The RCA is a large, dominant vessel. The right cusp and proximal RCA are calcified without significant obstruction present. The remaining segments of RCA are widely patent without stenoses.       Right Heart Pressures On plain fluoroscopy, the aortic valve is severely calcified and restricted. There is three-vessel coronary calcification present. Aortic root angiography demonstrates 2+ aortic insufficiency and normal size of the proximal ascending aorta.    Coronary Diagrams    Diagnostic Diagram            Implants    Name ID Temporary Type Supply   No information to display    Hemo Data       Most Recent Value   Fick Cardiac Output  2.53 L/min   Fick Cardiac Output Index  2.69 (L/min)/BSA  RA A Wave  8 mmHg   RA V Wave  7 mmHg   RA Mean  6 mmHg   RV Systolic Pressure  41 mmHg   RV Diastolic Pressure  7 mmHg   RV EDP  8 mmHg   PA Systolic Pressure  45 mmHg   PA Diastolic Pressure  10  mmHg   PA Mean  26 mmHg   PW A Wave  16 mmHg   PW V Wave  12 mmHg   PW Mean  11 mmHg   AO Systolic Pressure  95 mmHg   AO Diastolic Pressure  37 mmHg   AO Mean  58 mmHg   QP/QS  1   TPVR Index  9.65 HRUI   TSVR Index  21.52 HRUI   PVR SVR Ratio  0.29   TPVR/TSVR Ratio  0.45    Order-Level Documents:    There are no order-level documents.    Encounter-Level Documents - 08/02/15:      Scan on 08/13/2015 1:48 PM by Provider Default, MDScan on 08/13/2015 1:48 PM by Provider Default, MD     Scan on 08/13/2015 12:48 PM by Provider Default, MDScan on 08/13/2015 12:48 PM by Provider Default, MD     Scan on 08/11/2015 2:15 PM by Provider Default, MDScan on 08/11/2015 2:15 PM by Provider Default, MD     Electronic signature on 08/11/2015 11:47 AM    Signed    Electronically signed by Sherren Mocha, MD on 08/11/15 at 1433 EDT    CLINICAL DATA: Aortic stenosis  EXAM: Cardiac TAVR CT  MEDICATIONS: None  TECHNIQUE: The patient was scanned on a Philips 003 slice scanner. Gantry rotation speed was 270 msecs. Collimation was .26mm. A 100 kV prospective scan was triggered in the descending thoracic aorta at 111 HU's with 5% padding centered around 78% of the R-R interval. Average HR during the scan was bpm. The 3D data set was interpreted on a dedicated work station using MPR, MIP and VRT modes. A total of 80cc of contrast was used.  The patient was scanned on a Philips 256 scanner. A 120 kV retrospective scan was triggered in the descending thoracic aorta at 111 HU's. Gantry rotation speed was 270 msecs and collimation was .9 mm. No beta blockade or nitro were given. The 3D data set was reconstructed in 5% intervals of the R-R cycle. Systolic and diastolic phases were analyzed on a dedicated work station using MPR, MIP and VRT modes. The patient received 80 cc of contrast.  Aortic Valve: Trileaflet and heavily calcified No significant annular  calcification  Aorta: Normal origin great vessel moderate calcification of descending thoracic aorta  Sinotubular Junction: 24.6 mm  Ascending Thoracic Aorta: 29 mm  Aortic Arch: 25 mm  Descending Thoracic Aorta: 21 mm  Sinus of Valsalva Measurements:  Non-coronary: 27 mm  Right -coronary: 26.5 mm  Left -coronary: 29 mm  Coronary Artery Height above Annulus:  Left Main: 11.7 mm  Right Coronary: 14.5 mm  Virtual Basal Annulus Measurements:  Maximum/Minimum Diameter: 26 mm x 21 mm  Perimeter: 74 mm  Area: 426 mm2  Coronary Arteries: Sufficient height above annulus for deployment  Optimum Fluoroscopic Angle for Delivery: LAO 14 degrees / Caudal 16 degrees  IMPRESSION: 1) Trileaflet aortic valve suitable for either a 23 or 26 mm Sapien 3 valve. Area of 426 mm2 at upper range limit for 23 mm valve Balloon sizing may be appropriate during procedure  2) Sufficient height of coronary arteries for deployment  3) Optimum angle for delivery LAO 14 degrees Caudal 16 degrees  4) No LAA thrombus  Jenkins Rouge  Electronically Signed: By: Jenkins Rouge M.D. On: 08/17/2015 15:29  CLINICAL DATA: 74 year old female with history of severe aortic stenosis. Pre procedural study prior to potential transcatheter aortic valve replacement (TAVR) procedure.  EXAM: CT ANGIOGRAPHY CHEST, ABDOMEN AND PELVIS  TECHNIQUE: Multidetector CT imaging through the chest, abdomen and pelvis was performed using the standard protocol during bolus administration of intravenous contrast. Multiplanar reconstructed images and MIPs were obtained and reviewed to evaluate the vascular anatomy.  CONTRAST: 80 mL of Omnipaque 350.  COMPARISON: CT the abdomen and pelvis 06/27/2014.  FINDINGS: CTA CHEST FINDINGS  Mediastinum/Lymph Nodes: Heart size is mildly enlarged. There is no significant pericardial fluid, thickening or  pericardial calcification. Severe thickening calcification of the aortic valve. There is atherosclerosis of the thoracic aorta, the great vessels of the mediastinum and the coronary arteries, including calcified atherosclerotic plaque in the left anterior descending and right coronary arteries. No pathologically enlarged mediastinal or hilar lymph nodes. Esophagus is unremarkable in appearance. No axillary lymphadenopathy.  Lungs/Pleura: 3 mm subpleural nodule in the posterior aspect of the left lower lobe (image 35 of series 407), unchanged compared to 06/27/2014, considered benign. No other larger more suspicious appearing pulmonary nodules or masses are otherwise noted. No acute consolidative airspace disease. No pleural effusions.  Musculoskeletal/Soft Tissues: Status post left modified radical mastectomy. There are no aggressive appearing lytic or blastic lesions noted in the visualized portions of the skeleton.  CTA ABDOMEN AND PELVIS FINDINGS  Hepatobiliary: Mild diffuse decreased attenuation throughout the hepatic parenchyma, suggestive of hepatic steatosis (difficult to say for certain on today's contrast enhanced study). No focal cystic or solid hepatic lesions. No intra or extrahepatic biliary ductal dilatation. Gallbladder is normal in appearance.  Pancreas: No pancreatic mass. No pancreatic ductal dilatation. No pancreatic or peripancreatic fluid or inflammatory changes.  Spleen: Unremarkable.  Adrenals/Urinary Tract: Bilateral adrenal glands and the left kidney are normal in appearance. Exophytic 1.4 cm high attenuation (64 HU) lesion in the lateral aspect of the lower pole of the right kidney is incompletely characterized, but appear similar to prior study 06/27/2014, likely a proteinaceous/hemorrhagic cyst. Sub cm low-attenuation lesion in the upper pole of the right kidney is too small to definitively characterize, but is also stable compared to the  prior study, likely a tiny cyst. No hydroureteronephrosis. Urinary bladder is unremarkable in appearance.  Stomach/Bowel: The appearance of the stomach is normal. No pathologic dilatation of small bowel or colon.  Vascular/Lymphatic: Vascular findings and measurements pertinent to potential TAVR, as detailed below. No aneurysm identified. Small amount of saw tissue stranding and high attenuation fluid around the right common femoral artery, likely a small amount of hematoma following recent vascular access. No lymphadenopathy noted in the abdomen or pelvis.  Reproductive: Status post hysterectomy. Right ovary is unremarkable in appearance. Left ovary is not confidently identified may be surgically absent or atrophic.  Other: No significant volume of ascites. No pneumoperitoneum.  Musculoskeletal: There are no aggressive appearing lytic or blastic lesions noted in the visualized portions of the skeleton.  VASCULAR MEASUREMENTS PERTINENT TO TAVR:  AORTA:  Minimal Aortic Diameter - 13.6 x 11.0 mm  Severity of Aortic Calcification - moderate  RIGHT PELVIS:  Right Common Iliac Artery -  Minimal Diameter - 7.7 x 6.2 mm  Tortuosity - Mild  Calcification - Mild to moderate  Right External Iliac Artery -  Minimal Diameter - 6.7 x  6.6 mm  Tortuosity - Mild  Calcification - None  Right Common Femoral Artery -  Minimal Diameter - 7.3 x 6.9 mm  Tortuosity - Mild  Calcification - Mild  LEFT PELVIS:  Left Common Iliac Artery -  Minimal Diameter - 7.3 x 8.9 mm  Tortuosity - Mild  Calcification - Mild  Left External Iliac Artery -  Minimal Diameter - 5.5 x 4.9 mm  Tortuosity - Mild  Calcification - None  Left Common Femoral Artery -  Minimal Diameter - 6.3 x 6.5 mm  Tortuosity - Mild  Calcification - Mild  Review of the MIP images confirms the above findings.  IMPRESSION: 1. Vascular findings and measurements  pertinent to potential TAVR procedure, as discussed above. The patient does appear to have suitable pelvic arterial access on the right side. 2. Extensive thickening and calcification of the aortic valve, compatible with the reported clinical history of severe aortic stenosis. 3. Stable lesions in the right kidney, as discussed above, suggestive of a small hemorrhagic/proteinaceous cyst, and a small simple cyst. 4. Probable hepatic steatosis. 5. Additional incidental findings, as above.   Electronically Signed  By: Vinnie Langton M.D.  On: 08/22/2015 08:49   Impression:  She has stage D critical aortic stenosis with symptoms of exertional fatigue, shortness of breath and dizziness. I have personally reviewed her echo, cath and CT scans. She has a trileaflet aortic valve with severe thickening and heavy calcification of the leaflets with very limited leaflet mobility and a mean gradient of 64 mm Hg. I agree that aortic valve replacement is indicated in this patient. I think her operative risk for open surgical AVR would be quite high given her age and comorid factors including limited mobility due to rheumatoid arthritis and fibromyalgia, depression and anxiety. She says that she would never agree to open surgical AVR even if that was the only option. Her cardiac CT shows that she would be a candidate for a 23 or 26 mm Sapien 3 valve. She appears to have adequate pelvic access for a femoral approach.   Following the decision to proceed with transcatheter aortic valve replacement, a discussion has been held regarding what types of management strategies would be attempted intraoperatively in the event of life-threatening complications, including whether or not the patient would be considered a candidate for the use of cardiopulmonary bypass and/or conversion to open sternotomy for attempted surgical intervention. The patient has been advised of a variety of complications that might develop  including but not limited to risks of death, stroke, paravalvular leak, aortic dissection or other major vascular complications, aortic annulus rupture, device embolization, cardiac rupture or perforation, mitral regurgitation, acute myocardial infarction, arrhythmia, heart block or bradycardia requiring permanent pacemaker placement, congestive heart failure, respiratory failure, renal failure, pneumonia, infection, other late complications related to structural valve deterioration or migration, or other complications that might ultimately cause a temporary or permanent loss of functional independence or other long term morbidity.She and her husband understand, have no further questions, and agree to proceed.  Plan:  Transfemoral TAVR on Tuesday 08/30/2015    Gaye Pollack, MD

## 2015-08-29 MED ORDER — DEXMEDETOMIDINE HCL IN NACL 400 MCG/100ML IV SOLN
0.1000 ug/kg/h | INTRAVENOUS | Status: AC
  Administered 2015-08-30: .2 ug/kg/h via INTRAVENOUS
  Administered 2015-08-30: .5 ug/kg/h via INTRAVENOUS
  Filled 2015-08-29: qty 100

## 2015-08-29 MED ORDER — SODIUM CHLORIDE 0.9 % IV SOLN
INTRAVENOUS | Status: DC
  Filled 2015-08-29: qty 2.5

## 2015-08-29 MED ORDER — CHLORHEXIDINE GLUCONATE 4 % EX LIQD: 30.0000 mL | CUTANEOUS | Status: DC

## 2015-08-29 MED ORDER — NITROGLYCERIN IN D5W 200-5 MCG/ML-% IV SOLN
2.0000 ug/min | INTRAVENOUS | Status: AC
  Administered 2015-08-30: 5 ug/min via INTRAVENOUS
  Filled 2015-08-29: qty 250

## 2015-08-29 MED ORDER — SODIUM CHLORIDE 0.9 % IV SOLN
INTRAVENOUS | Status: DC
  Filled 2015-08-29: qty 30

## 2015-08-29 MED ORDER — DOPAMINE-DEXTROSE 3.2-5 MG/ML-% IV SOLN
0.0000 ug/kg/min | INTRAVENOUS | Status: DC
  Filled 2015-08-29: qty 250

## 2015-08-29 MED ORDER — SODIUM CHLORIDE 0.9 % IV SOLN
1500.0000 mg | INTRAVENOUS | Status: AC
  Administered 2015-08-30: 1500 mg via INTRAVENOUS
  Filled 2015-08-29: qty 1500

## 2015-08-29 MED ORDER — DEXTROSE 5 % IV SOLN
1.5000 g | INTRAVENOUS | Status: AC
  Administered 2015-08-30: 1.5 g via INTRAVENOUS
  Filled 2015-08-29 (×2): qty 1.5

## 2015-08-29 MED ORDER — EPINEPHRINE HCL 1 MG/ML IJ SOLN
0.0000 ug/min | INTRAVENOUS | Status: DC
  Filled 2015-08-29: qty 4

## 2015-08-29 MED ORDER — MAGNESIUM SULFATE 50 % IJ SOLN
40.0000 meq | INTRAMUSCULAR | Status: DC
  Filled 2015-08-29: qty 10

## 2015-08-29 MED ORDER — NOREPINEPHRINE BITARTRATE 1 MG/ML IV SOLN
0.0000 ug/min | INTRAVENOUS | Status: DC
  Filled 2015-08-29: qty 4

## 2015-08-29 MED ORDER — PHENYLEPHRINE HCL 10 MG/ML IJ SOLN
30.0000 ug/min | INTRAVENOUS | Status: DC
  Filled 2015-08-29: qty 2

## 2015-08-29 MED ORDER — POTASSIUM CHLORIDE 2 MEQ/ML IV SOLN
80.0000 meq | INTRAVENOUS | Status: DC
  Filled 2015-08-29: qty 40

## 2015-08-29 MED ORDER — SODIUM CHLORIDE 0.9 % IV SOLN: INTRAVENOUS | Status: DC

## 2015-08-30 ENCOUNTER — Encounter (HOSPITAL_COMMUNITY): Payer: Self-pay | Admitting: Anesthesiology

## 2015-08-30 ENCOUNTER — Inpatient Hospital Stay (HOSPITAL_COMMUNITY): Payer: Medicare Other | Admitting: Anesthesiology

## 2015-08-30 ENCOUNTER — Inpatient Hospital Stay (HOSPITAL_COMMUNITY): Payer: Medicare Other

## 2015-08-30 ENCOUNTER — Encounter (HOSPITAL_COMMUNITY)
Admission: RE | Disposition: A | Discharge status: Home / Self Care | Payer: Medicare Other | Source: Ambulatory Visit | Attending: Cardiovascular Disease

## 2015-08-30 ENCOUNTER — Inpatient Hospital Stay (HOSPITAL_COMMUNITY)
Admission: RE | Admit: 2015-08-30 | Discharge: 2015-09-07 | DRG: 267 | Disposition: A | Discharge status: Home / Self Care | Payer: Medicare Other | Source: Ambulatory Visit | Attending: Cardiovascular Disease | Admitting: Cardiovascular Disease

## 2015-08-30 DIAGNOSIS — Z953 Presence of xenogenic heart valve: Secondary | ICD-10-CM

## 2015-08-30 DIAGNOSIS — I442 Atrioventricular block, complete: Secondary | ICD-10-CM | POA: Diagnosis not present

## 2015-08-30 DIAGNOSIS — J948 Other specified pleural conditions: Secondary | ICD-10-CM | POA: Diagnosis present

## 2015-08-30 DIAGNOSIS — M069 Rheumatoid arthritis, unspecified: Secondary | ICD-10-CM | POA: Diagnosis present

## 2015-08-30 DIAGNOSIS — J939 Pneumothorax, unspecified: Secondary | ICD-10-CM | POA: Diagnosis not present

## 2015-08-30 DIAGNOSIS — I5032 Chronic diastolic (congestive) heart failure: Secondary | ICD-10-CM | POA: Diagnosis present

## 2015-08-30 DIAGNOSIS — Z9689 Presence of other specified functional implants: Secondary | ICD-10-CM

## 2015-08-30 DIAGNOSIS — I352 Nonrheumatic aortic (valve) stenosis with insufficiency: Principal | ICD-10-CM | POA: Diagnosis present

## 2015-08-30 DIAGNOSIS — C801 Malignant (primary) neoplasm, unspecified: Secondary | ICD-10-CM | POA: Diagnosis present

## 2015-08-30 DIAGNOSIS — K589 Irritable bowel syndrome without diarrhea: Secondary | ICD-10-CM | POA: Diagnosis present

## 2015-08-30 DIAGNOSIS — I2119 ST elevation (STEMI) myocardial infarction involving other coronary artery of inferior wall: Secondary | ICD-10-CM | POA: Diagnosis not present

## 2015-08-30 DIAGNOSIS — I10 Essential (primary) hypertension: Secondary | ICD-10-CM | POA: Diagnosis present

## 2015-08-30 DIAGNOSIS — Z853 Personal history of malignant neoplasm of breast: Secondary | ICD-10-CM | POA: Diagnosis not present

## 2015-08-30 DIAGNOSIS — Z7982 Long term (current) use of aspirin: Secondary | ICD-10-CM

## 2015-08-30 DIAGNOSIS — Z95818 Presence of other cardiac implants and grafts: Secondary | ICD-10-CM

## 2015-08-30 DIAGNOSIS — I110 Hypertensive heart disease with heart failure: Secondary | ICD-10-CM | POA: Diagnosis present

## 2015-08-30 DIAGNOSIS — J9382 Other air leak: Secondary | ICD-10-CM | POA: Diagnosis not present

## 2015-08-30 DIAGNOSIS — Z954 Presence of other heart-valve replacement: Secondary | ICD-10-CM | POA: Diagnosis not present

## 2015-08-30 DIAGNOSIS — I350 Nonrheumatic aortic (valve) stenosis: Secondary | ICD-10-CM

## 2015-08-30 DIAGNOSIS — R42 Dizziness and giddiness: Secondary | ICD-10-CM | POA: Diagnosis present

## 2015-08-30 DIAGNOSIS — I4510 Unspecified right bundle-branch block: Secondary | ICD-10-CM | POA: Diagnosis present

## 2015-08-30 DIAGNOSIS — E876 Hypokalemia: Secondary | ICD-10-CM | POA: Diagnosis not present

## 2015-08-30 DIAGNOSIS — M797 Fibromyalgia: Secondary | ICD-10-CM | POA: Diagnosis present

## 2015-08-30 DIAGNOSIS — M549 Dorsalgia, unspecified: Secondary | ICD-10-CM | POA: Diagnosis present

## 2015-08-30 DIAGNOSIS — E119 Type 2 diabetes mellitus without complications: Secondary | ICD-10-CM | POA: Diagnosis present

## 2015-08-30 DIAGNOSIS — Z952 Presence of prosthetic heart valve: Secondary | ICD-10-CM

## 2015-08-30 DIAGNOSIS — J9311 Primary spontaneous pneumothorax: Secondary | ICD-10-CM | POA: Diagnosis not present

## 2015-08-30 DIAGNOSIS — Z006 Encounter for examination for normal comparison and control in clinical research program: Secondary | ICD-10-CM

## 2015-08-30 DIAGNOSIS — J95811 Postprocedural pneumothorax: Secondary | ICD-10-CM | POA: Diagnosis not present

## 2015-08-30 DIAGNOSIS — E039 Hypothyroidism, unspecified: Secondary | ICD-10-CM | POA: Diagnosis present

## 2015-08-30 DIAGNOSIS — J9811 Atelectasis: Secondary | ICD-10-CM

## 2015-08-30 DIAGNOSIS — G8929 Other chronic pain: Secondary | ICD-10-CM | POA: Diagnosis present

## 2015-08-30 DIAGNOSIS — D62 Acute posthemorrhagic anemia: Secondary | ICD-10-CM | POA: Diagnosis not present

## 2015-08-30 HISTORY — DX: Presence of prosthetic heart valve: Z95.2

## 2015-08-30 HISTORY — DX: Chronic diastolic (congestive) heart failure: I50.32

## 2015-08-30 HISTORY — DX: Atrioventricular block, complete: I44.2

## 2015-08-30 HISTORY — PX: TEE WITHOUT CARDIOVERSION: SHX5443

## 2015-08-30 HISTORY — PX: TRANSCATHETER AORTIC VALVE REPLACEMENT, TRANSFEMORAL: SHX6400

## 2015-08-30 LAB — POCT I-STAT, CHEM 8
BUN: 15 mg/dL (ref 6–20)
BUN: 13 mg/dL (ref 6–20)
BUN: 15 mg/dL (ref 6–20)
CALCIUM ION: 1.23 mmol/L (ref 1.13–1.30)
CALCIUM ION: 1.21 mmol/L (ref 1.13–1.30)
CALCIUM ION: 1.28 mmol/L (ref 1.13–1.30)
CHLORIDE: 102 mmol/L (ref 101–111)
CHLORIDE: 104 mmol/L (ref 101–111)
CHLORIDE: 103 mmol/L (ref 101–111)
CREATININE: 0.5 mg/dL (ref 0.44–1.00)
CREATININE: 0.7 mg/dL (ref 0.44–1.00)
Creatinine, Ser: 0.5 mg/dL (ref 0.44–1.00)
GLUCOSE: 126 mg/dL — AB (ref 65–99)
GLUCOSE: 115 mg/dL — AB (ref 65–99)
Glucose, Bld: 131 mg/dL — ABNORMAL HIGH (ref 65–99)
HCT: 31 % — ABNORMAL LOW (ref 36.0–46.0)
HCT: 31 % — ABNORMAL LOW (ref 36.0–46.0)
HEMATOCRIT: 27 % — AB (ref 36.0–46.0)
Hemoglobin: 10.5 g/dL — ABNORMAL LOW (ref 12.0–15.0)
Hemoglobin: 9.2 g/dL — ABNORMAL LOW (ref 12.0–15.0)
Hemoglobin: 10.5 g/dL — ABNORMAL LOW (ref 12.0–15.0)
POTASSIUM: 4.3 mmol/L (ref 3.5–5.1)
Potassium: 4 mmol/L (ref 3.5–5.1)
Potassium: 4.3 mmol/L (ref 3.5–5.1)
SODIUM: 139 mmol/L (ref 135–145)
SODIUM: 141 mmol/L (ref 135–145)
Sodium: 137 mmol/L (ref 135–145)
TCO2: 29 mmol/L (ref 0–100)
TCO2: 28 mmol/L (ref 0–100)
TCO2: 27 mmol/L (ref 0–100)

## 2015-08-30 LAB — BLOOD GAS, ARTERIAL
ACID-BASE EXCESS: 3.6 mmol/L — AB (ref 0.0–2.0)
BICARBONATE: 27.6 meq/L — AB (ref 20.0–24.0)
Drawn by: 206361
FIO2: 0.21
O2 SAT: 97.7 %
PCO2 ART: 41.7 mmHg (ref 35.0–45.0)
PO2 ART: 98.7 mmHg (ref 80.0–100.0)
Patient temperature: 98.6
TCO2: 28.9 mmol/L (ref 0–100)
pH, Arterial: 7.436 (ref 7.350–7.450)

## 2015-08-30 LAB — CBC
HCT: 31.5 % — ABNORMAL LOW (ref 36.0–46.0)
HEMATOCRIT: 32.4 % — AB (ref 36.0–46.0)
HEMOGLOBIN: 10.1 g/dL — AB (ref 12.0–15.0)
Hemoglobin: 10 g/dL — ABNORMAL LOW (ref 12.0–15.0)
MCH: 27 pg (ref 26.0–34.0)
MCH: 26.9 pg (ref 26.0–34.0)
MCHC: 31.7 g/dL (ref 30.0–36.0)
MCHC: 31.2 g/dL (ref 30.0–36.0)
MCV: 85.1 fL (ref 78.0–100.0)
MCV: 86.2 fL (ref 78.0–100.0)
Platelets: 151 10*3/uL (ref 150–400)
Platelets: 154 10*3/uL (ref 150–400)
RBC: 3.7 MIL/uL — AB (ref 3.87–5.11)
RBC: 3.76 MIL/uL — AB (ref 3.87–5.11)
RDW: 15.4 % (ref 11.5–15.5)
RDW: 15.4 % (ref 11.5–15.5)
WBC: 7.2 10*3/uL (ref 4.0–10.5)
WBC: 9.4 10*3/uL (ref 4.0–10.5)

## 2015-08-30 LAB — POCT I-STAT 3, ART BLOOD GAS (G3+)
BICARBONATE: 26.5 meq/L — AB (ref 20.0–24.0)
O2 Saturation: 99 %
PCO2 ART: 46.8 mmHg — AB (ref 35.0–45.0)
PH ART: 7.356 (ref 7.350–7.450)
PO2 ART: 144 mmHg — AB (ref 80.0–100.0)
Patient temperature: 35.8
TCO2: 28 mmol/L (ref 0–100)

## 2015-08-30 LAB — PREPARE RBC (CROSSMATCH)

## 2015-08-30 LAB — PROTIME-INR
INR: 1.29 (ref 0.00–1.49)
Prothrombin Time: 16.3 seconds — ABNORMAL HIGH (ref 11.6–15.2)

## 2015-08-30 LAB — CREATININE, SERUM
Creatinine, Ser: 0.7 mg/dL (ref 0.44–1.00)
GFR calc Af Amer: >60 mL/min (ref >60)
GFR calc non Af Amer: >60 mL/min (ref >60)

## 2015-08-30 LAB — POCT I-STAT 4, (NA,K, GLUC, HGB,HCT)
GLUCOSE: 137 mg/dL — AB (ref 65–99)
HEMATOCRIT: 28 % — AB (ref 36.0–46.0)
HEMOGLOBIN: 9.5 g/dL — AB (ref 12.0–15.0)
POTASSIUM: 3.8 mmol/L (ref 3.5–5.1)
SODIUM: 142 mmol/L (ref 135–145)

## 2015-08-30 LAB — GLUCOSE, CAPILLARY: Glucose-Capillary: 112 mg/dL — ABNORMAL HIGH (ref 65–99)

## 2015-08-30 LAB — APTT: APTT: 32 s (ref 24–37)

## 2015-08-30 LAB — MAGNESIUM: Magnesium: 2 mg/dL (ref 1.7–2.4)

## 2015-08-30 SURGERY — IMPLANTATION, AORTIC VALVE, TRANSCATHETER, FEMORAL APPROACH
Anesthesia: General | Site: Chest

## 2015-08-30 MED ORDER — DEXTROSE 5 % IV SOLN
0.0000 ug/min | INTRAVENOUS | Status: DC
  Filled 2015-08-30: qty 2

## 2015-08-30 MED ORDER — SODIUM CHLORIDE 0.9 % IJ SOLN
3.0000 mL | Freq: Two times a day (BID) | INTRAMUSCULAR | Status: DC
  Administered 2015-08-30 – 2015-09-06 (×14): 3 mL via INTRAVENOUS
  Administered 2015-09-07: 11:00:00 via INTRAVENOUS

## 2015-08-30 MED ORDER — NITROGLYCERIN IN D5W 200-5 MCG/ML-% IV SOLN: 0.0000 ug/min | INTRAVENOUS | Status: DC

## 2015-08-30 MED ORDER — 0.9 % SODIUM CHLORIDE (POUR BTL) OPTIME
TOPICAL | Status: DC | PRN
  Administered 2015-08-30: 1000 mL

## 2015-08-30 MED ORDER — LACTATED RINGERS IV SOLN: 500.0000 mL | Freq: Once | INTRAVENOUS | Status: DC | PRN

## 2015-08-30 MED ORDER — HEPARIN SODIUM (PORCINE) 1000 UNIT/ML IJ SOLN
INTRAMUSCULAR | Status: DC | PRN
  Administered 2015-08-30: 8000 [IU] via INTRAVENOUS

## 2015-08-30 MED ORDER — ONDANSETRON HCL 4 MG/2ML IJ SOLN: 4.0000 mg | Freq: Four times a day (QID) | INTRAMUSCULAR | Status: DC | PRN

## 2015-08-30 MED ORDER — SODIUM CHLORIDE 0.9 % IV SOLN
250.0000 mL | INTRAVENOUS | Status: DC | PRN
  Administered 2015-08-30: 250 mL via INTRAVENOUS

## 2015-08-30 MED ORDER — ROCURONIUM BROMIDE 50 MG/5ML IV SOLN
INTRAVENOUS | Status: AC
  Filled 2015-08-30: qty 1

## 2015-08-30 MED ORDER — PANTOPRAZOLE SODIUM 40 MG PO TBEC
40.0000 mg | DELAYED_RELEASE_TABLET | Freq: Every day | ORAL | Status: DC
  Administered 2015-09-01 – 2015-09-07 (×7): 40 mg via ORAL
  Filled 2015-08-30 (×7): qty 1

## 2015-08-30 MED ORDER — SODIUM CHLORIDE 0.9 % IV SOLN: Freq: Once | INTRAVENOUS | Status: DC

## 2015-08-30 MED ORDER — CLONAZEPAM 1 MG PO TABS
1.0000 mg | ORAL_TABLET | Freq: Every day | ORAL | Status: DC
  Administered 2015-08-31 – 2015-09-06 (×7): 1 mg via ORAL
  Filled 2015-08-30 (×7): qty 1

## 2015-08-30 MED ORDER — SODIUM CHLORIDE 0.9 % IV SOLN
1.0000 mL/kg/h | INTRAVENOUS | Status: AC
  Administered 2015-08-30: 1 mL/kg/h via INTRAVENOUS

## 2015-08-30 MED ORDER — PROPOFOL 10 MG/ML IV BOLUS
INTRAVENOUS | Status: AC
  Filled 2015-08-30: qty 20

## 2015-08-30 MED ORDER — CITALOPRAM HYDROBROMIDE 20 MG PO TABS
20.0000 mg | ORAL_TABLET | Freq: Every day | ORAL | Status: DC
  Administered 2015-09-01 – 2015-09-06 (×6): 20 mg via ORAL
  Filled 2015-08-30 (×7): qty 1

## 2015-08-30 MED ORDER — FENTANYL CITRATE (PF) 100 MCG/2ML IJ SOLN
INTRAMUSCULAR | Status: AC
  Filled 2015-08-30: qty 2

## 2015-08-30 MED ORDER — NITROGLYCERIN IN D5W 200-5 MCG/ML-% IV SOLN: 2.0000 ug/min | INTRAVENOUS | Status: DC

## 2015-08-30 MED ORDER — FENTANYL CITRATE (PF) 100 MCG/2ML IJ SOLN
50.0000 ug | Freq: Once | INTRAMUSCULAR | Status: AC
  Administered 2015-08-30: 50 ug via INTRAVENOUS

## 2015-08-30 MED ORDER — OXYCODONE HCL 5 MG PO TABS
5.0000 mg | ORAL_TABLET | ORAL | Status: DC | PRN
  Administered 2015-08-31 (×2): 5 mg via ORAL
  Administered 2015-09-02 – 2015-09-03 (×4): 10 mg via ORAL
  Administered 2015-09-03: 5 mg via ORAL
  Administered 2015-09-03: 10 mg via ORAL
  Administered 2015-09-04 – 2015-09-07 (×7): 5 mg via ORAL
  Filled 2015-08-30 (×2): qty 1
  Filled 2015-08-30: qty 2
  Filled 2015-08-30: qty 1
  Filled 2015-08-30 (×2): qty 2
  Filled 2015-08-30 (×4): qty 1
  Filled 2015-08-30: qty 2
  Filled 2015-08-30 (×3): qty 1
  Filled 2015-08-30: qty 2

## 2015-08-30 MED ORDER — ALBUMIN HUMAN 5 % IV SOLN
INTRAVENOUS | Status: DC | PRN
  Administered 2015-08-30: 12:00:00 via INTRAVENOUS

## 2015-08-30 MED ORDER — ACETAMINOPHEN 650 MG RE SUPP
650.0000 mg | Freq: Once | RECTAL | Status: AC
  Administered 2015-08-30: 650 mg via RECTAL

## 2015-08-30 MED ORDER — LIDOCAINE HCL (CARDIAC) 20 MG/ML IV SOLN
INTRAVENOUS | Status: DC | PRN
  Administered 2015-08-30: 100 mg via INTRAVENOUS

## 2015-08-30 MED ORDER — ALBUMIN HUMAN 5 % IV SOLN: 250.0000 mL | INTRAVENOUS | Status: AC | PRN

## 2015-08-30 MED ORDER — GLYCOPYRROLATE 0.2 MG/ML IJ SOLN
INTRAMUSCULAR | Status: DC | PRN
Start: 2015-08-30 — End: 2015-08-30
  Administered 2015-08-30 (×2): 0.2 mg via INTRAVENOUS

## 2015-08-30 MED ORDER — SODIUM CHLORIDE 0.9 % IV SOLN
INTRAVENOUS | Status: DC
  Filled 2015-08-30: qty 2.5

## 2015-08-30 MED ORDER — IODIXANOL 320 MG/ML IV SOLN
INTRAVENOUS | Status: DC | PRN
  Administered 2015-08-30: 150 mL via INTRAVENOUS

## 2015-08-30 MED ORDER — MORPHINE SULFATE (PF) 2 MG/ML IV SOLN
2.0000 mg | INTRAVENOUS | Status: DC | PRN
  Administered 2015-08-30 (×2): 2 mg via INTRAVENOUS
  Filled 2015-08-30 (×2): qty 1

## 2015-08-30 MED ORDER — LIDOCAINE HCL (CARDIAC) 20 MG/ML IV SOLN
INTRAVENOUS | Status: AC
  Administered 2015-08-30: 100 mg via INTRAVENOUS
  Filled 2015-08-30: qty 5

## 2015-08-30 MED ORDER — LEVOTHYROXINE SODIUM 100 MCG PO TABS
100.0000 ug | ORAL_TABLET | Freq: Every day | ORAL | Status: DC
  Administered 2015-08-31 – 2015-09-07 (×8): 100 ug via ORAL
  Filled 2015-08-30 (×9): qty 1

## 2015-08-30 MED ORDER — OXYCODONE HCL 5 MG PO TABS: 5.0000 mg | ORAL_TABLET | Freq: Once | ORAL | Status: DC | PRN

## 2015-08-30 MED ORDER — DEXMEDETOMIDINE HCL IN NACL 200 MCG/50ML IV SOLN: 0.1000 ug/kg/h | INTRAVENOUS | Status: DC

## 2015-08-30 MED ORDER — INSULIN REGULAR BOLUS VIA INFUSION
0.0000 [IU] | Freq: Three times a day (TID) | INTRAVENOUS | Status: DC
  Filled 2015-08-30: qty 10

## 2015-08-30 MED ORDER — LACTATED RINGERS IV SOLN
INTRAVENOUS | Status: DC
  Administered 2015-08-30 (×2): via INTRAVENOUS

## 2015-08-30 MED ORDER — REMIFENTANIL HCL 1 MG IV SOLR
INTRAVENOUS | Status: DC | PRN
  Administered 2015-08-30: .5 ug/kg/min via INTRAVENOUS

## 2015-08-30 MED ORDER — ACETAMINOPHEN 160 MG/5ML PO SOLN: 650.0000 mg | Freq: Once | ORAL | Status: AC

## 2015-08-30 MED ORDER — METOPROLOL TARTRATE 25 MG/10 ML ORAL SUSPENSION
12.5000 mg | Freq: Two times a day (BID) | ORAL | Status: DC
  Filled 2015-08-30 (×3): qty 5

## 2015-08-30 MED ORDER — ROCURONIUM BROMIDE 100 MG/10ML IV SOLN
INTRAVENOUS | Status: DC | PRN
  Administered 2015-08-30: 50 mg via INTRAVENOUS

## 2015-08-30 MED ORDER — ASPIRIN EC 81 MG PO TBEC
81.0000 mg | DELAYED_RELEASE_TABLET | Freq: Every day | ORAL | Status: DC
  Administered 2015-09-01 – 2015-09-07 (×7): 81 mg via ORAL
  Filled 2015-08-30 (×8): qty 1

## 2015-08-30 MED ORDER — GLYCOPYRROLATE 0.2 MG/ML IJ SOLN
INTRAMUSCULAR | Status: AC
  Filled 2015-08-30: qty 2

## 2015-08-30 MED ORDER — VANCOMYCIN HCL IN DEXTROSE 1-5 GM/200ML-% IV SOLN
1000.0000 mg | Freq: Once | INTRAVENOUS | Status: AC
  Administered 2015-08-30: 1000 mg via INTRAVENOUS
  Filled 2015-08-30: qty 200

## 2015-08-30 MED ORDER — DEXTROSE 5 % IV SOLN
1.5000 g | Freq: Two times a day (BID) | INTRAVENOUS | Status: AC
  Administered 2015-08-30 – 2015-09-01 (×3): 1.5 g via INTRAVENOUS
  Filled 2015-08-30 (×4): qty 1.5

## 2015-08-30 MED ORDER — OXYCODONE HCL 5 MG/5ML PO SOLN: 5.0000 mg | Freq: Once | ORAL | Status: DC | PRN

## 2015-08-30 MED ORDER — METOPROLOL TARTRATE 12.5 MG HALF TABLET
12.5000 mg | ORAL_TABLET | Freq: Two times a day (BID) | ORAL | Status: DC
  Filled 2015-08-30 (×3): qty 1

## 2015-08-30 MED ORDER — ACETAMINOPHEN 160 MG/5ML PO SOLN: 1000.0000 mg | Freq: Four times a day (QID) | ORAL | Status: DC

## 2015-08-30 MED ORDER — HEPARIN SODIUM (PORCINE) 1000 UNIT/ML IJ SOLN
INTRAMUSCULAR | Status: AC
  Filled 2015-08-30: qty 1

## 2015-08-30 MED ORDER — SODIUM CHLORIDE 0.9 % IJ SOLN
3.0000 mL | INTRAMUSCULAR | Status: DC | PRN
  Administered 2015-09-03: 3 mL via INTRAVENOUS
  Filled 2015-08-30: qty 3

## 2015-08-30 MED ORDER — FENTANYL CITRATE (PF) 250 MCG/5ML IJ SOLN
INTRAMUSCULAR | Status: AC
  Filled 2015-08-30: qty 5

## 2015-08-30 MED ORDER — FENTANYL CITRATE (PF) 100 MCG/2ML IJ SOLN: 25.0000 ug | INTRAMUSCULAR | Status: DC | PRN

## 2015-08-30 MED ORDER — CLOPIDOGREL BISULFATE 75 MG PO TABS
75.0000 mg | ORAL_TABLET | Freq: Every day | ORAL | Status: DC
Start: 2015-08-31 — End: 2015-09-07
  Administered 2015-08-31 – 2015-09-07 (×8): 75 mg via ORAL
  Filled 2015-08-30 (×9): qty 1

## 2015-08-30 MED ORDER — SODIUM CHLORIDE 0.9 % IV SOLN
INTRAVENOUS | Status: DC | PRN
  Administered 2015-08-30 (×3): 500 mL

## 2015-08-30 MED ORDER — TRAMADOL HCL 50 MG PO TABS
50.0000 mg | ORAL_TABLET | ORAL | Status: DC | PRN
  Administered 2015-09-06: 100 mg via ORAL
  Filled 2015-08-30: qty 2

## 2015-08-30 MED ORDER — FENTANYL CITRATE (PF) 100 MCG/2ML IJ SOLN
INTRAMUSCULAR | Status: DC | PRN
  Administered 2015-08-30: 100 ug via INTRAVENOUS

## 2015-08-30 MED ORDER — ONDANSETRON HCL 4 MG/2ML IJ SOLN
INTRAMUSCULAR | Status: AC
  Filled 2015-08-30: qty 2

## 2015-08-30 MED ORDER — MIDAZOLAM HCL 2 MG/2ML IJ SOLN: 2.0000 mg | INTRAMUSCULAR | Status: DC | PRN

## 2015-08-30 MED ORDER — LIDOCAINE HCL (CARDIAC) 20 MG/ML IV SOLN
100.0000 mg | Freq: Once | INTRAVENOUS | Status: AC
  Administered 2015-08-30: 100 mg via INTRAVENOUS

## 2015-08-30 MED ORDER — LACTATED RINGERS IV SOLN
INTRAVENOUS | Status: DC | PRN
  Administered 2015-08-30: 11:00:00 via INTRAVENOUS

## 2015-08-30 MED ORDER — MIDAZOLAM HCL 2 MG/2ML IJ SOLN
INTRAMUSCULAR | Status: AC
  Filled 2015-08-30: qty 2

## 2015-08-30 MED ORDER — ATORVASTATIN CALCIUM 20 MG PO TABS
20.0000 mg | ORAL_TABLET | Freq: Every day | ORAL | Status: DC
  Administered 2015-09-01 – 2015-09-06 (×6): 20 mg via ORAL
  Filled 2015-08-30 (×7): qty 1

## 2015-08-30 MED ORDER — PROPOFOL 10 MG/ML IV BOLUS
INTRAVENOUS | Status: DC | PRN
  Administered 2015-08-30: 80 mg via INTRAVENOUS

## 2015-08-30 MED ORDER — NEOSTIGMINE METHYLSULFATE 10 MG/10ML IV SOLN
INTRAVENOUS | Status: AC
  Filled 2015-08-30: qty 1

## 2015-08-30 MED ORDER — MORPHINE SULFATE (PF) 2 MG/ML IV SOLN: 1.0000 mg | INTRAVENOUS | Status: AC | PRN

## 2015-08-30 MED ORDER — ACETAMINOPHEN 500 MG PO TABS
1000.0000 mg | ORAL_TABLET | Freq: Four times a day (QID) | ORAL | Status: AC
  Administered 2015-08-31 – 2015-09-04 (×16): 1000 mg via ORAL
  Filled 2015-08-30 (×20): qty 2

## 2015-08-30 MED ORDER — METOPROLOL TARTRATE 1 MG/ML IV SOLN
2.5000 mg | INTRAVENOUS | Status: DC | PRN
  Administered 2015-09-02: 2.5 mg via INTRAVENOUS
  Filled 2015-08-30: qty 5

## 2015-08-30 MED ORDER — PROTAMINE SULFATE 10 MG/ML IV SOLN
INTRAVENOUS | Status: DC | PRN
  Administered 2015-08-30: 80 mg via INTRAVENOUS

## 2015-08-30 MED ORDER — FAMOTIDINE IN NACL 20-0.9 MG/50ML-% IV SOLN
20.0000 mg | Freq: Two times a day (BID) | INTRAVENOUS | Status: DC
  Administered 2015-08-30: 20 mg via INTRAVENOUS
  Filled 2015-08-30: qty 50

## 2015-08-30 MED FILL — Magnesium Sulfate Inj 50%: INTRAMUSCULAR | Qty: 10 | Status: AC

## 2015-08-30 MED FILL — Heparin Sodium (Porcine) Inj 1000 Unit/ML: INTRAMUSCULAR | Qty: 30 | Status: AC

## 2015-08-30 MED FILL — Potassium Chloride Inj 2 mEq/ML: INTRAVENOUS | Qty: 40 | Status: AC

## 2015-08-30 SURGICAL SUPPLY — 110 items
ADAPTER UNIV SWAN GANZ BIP (ADAPTER) IMPLANT
ADAPTER UNV SWAN GANZ BIP (ADAPTER) ×2
ADH SKN CLS APL DERMABOND .7 (GAUZE/BANDAGES/DRESSINGS) ×2
ADPR CATH UNV NS SG CATH (ADAPTER) ×2
ARTERIAL PRESSURE LINE (MISCELLANEOUS) ×2 IMPLANT
ATTRACTOMAT 16X20 MAGNETIC DRP (DRAPES) IMPLANT
BAG BANDED W/RUBBER/TAPE 36X54 (MISCELLANEOUS) ×4 IMPLANT
BAG DECANTER FOR FLEXI CONT (MISCELLANEOUS) IMPLANT
BAG EQP BAND 135X91 W/RBR TAPE (MISCELLANEOUS) ×2
BAG SNAP BAND KOVER 36X36 (MISCELLANEOUS) ×8 IMPLANT
BLADE 10 SAFETY STRL DISP (BLADE) ×4 IMPLANT
BLADE STERNUM SYSTEM 6 (BLADE) ×4 IMPLANT
BLADE SURG ROTATE 9660 (MISCELLANEOUS) IMPLANT
CABLE PACING FASLOC BIEGE (MISCELLANEOUS) IMPLANT
CABLE PACING FASLOC BLUE (MISCELLANEOUS) ×4 IMPLANT
CANISTER SUCTION 2500CC (MISCELLANEOUS) IMPLANT
CANNULA FEM VENOUS REMOTE 22FR (CANNULA) IMPLANT
CANNULA OPTISITE PERFUSION 16F (CANNULA) IMPLANT
CANNULA OPTISITE PERFUSION 18F (CANNULA) IMPLANT
CATH DIAG EXPO 6F VENT PIG 145 (CATHETERS) ×4 IMPLANT
CATH EXPO 5FR AL1 (CATHETERS) ×2 IMPLANT
CATH S G BIP PACING (SET/KITS/TRAYS/PACK) ×6 IMPLANT
CATH SOFT-VU 4F 65 STRAIGHT (CATHETERS) IMPLANT
CATH SOFT-VU STRAIGHT 4F 65CM (CATHETERS) ×4
CLIP TI MEDIUM 24 (CLIP) ×4 IMPLANT
CLIP TI WIDE RED SMALL 24 (CLIP) ×4 IMPLANT
CONT SPEC 4OZ CLIKSEAL STRL BL (MISCELLANEOUS) ×6 IMPLANT
COVER DOME SNAP 22 D (MISCELLANEOUS) ×4 IMPLANT
COVER MAYO STAND STRL (DRAPES) ×4 IMPLANT
COVER TABLE BACK 60X90 (DRAPES) ×4 IMPLANT
CRADLE DONUT ADULT HEAD (MISCELLANEOUS) ×4 IMPLANT
DERMABOND ADVANCED (GAUZE/BANDAGES/DRESSINGS) ×2
DERMABOND ADVANCED .7 DNX12 (GAUZE/BANDAGES/DRESSINGS) ×2 IMPLANT
DRAPE INCISE IOBAN 66X45 STRL (DRAPES) IMPLANT
DRAPE SLUSH MACHINE 52X66 (DRAPES) ×4 IMPLANT
DRAPE TABLE COVER HEAVY DUTY (DRAPES) ×4 IMPLANT
DRSG TEGADERM 4X4.75 (GAUZE/BANDAGES/DRESSINGS) ×4 IMPLANT
ELECT REM PT RETURN 9FT ADLT (ELECTROSURGICAL) ×8
ELECTRODE REM PT RTRN 9FT ADLT (ELECTROSURGICAL) ×4 IMPLANT
FELT TEFLON 6X6 (MISCELLANEOUS) ×4 IMPLANT
FEMORAL VENOUS CANN RAP (CANNULA) IMPLANT
GAUZE SPONGE 4X4 12PLY STRL (GAUZE/BANDAGES/DRESSINGS) ×4 IMPLANT
GLOVE BIO SURGEON STRL SZ7.5 (GLOVE) ×4 IMPLANT
GLOVE BIO SURGEON STRL SZ8 (GLOVE) ×8 IMPLANT
GLOVE BIOGEL M 8.0 STRL (GLOVE) ×2 IMPLANT
GLOVE BIOGEL PI IND STRL 6.5 (GLOVE) IMPLANT
GLOVE BIOGEL PI IND STRL 7.0 (GLOVE) IMPLANT
GLOVE BIOGEL PI INDICATOR 6.5 (GLOVE) ×4
GLOVE BIOGEL PI INDICATOR 7.0 (GLOVE) ×2
GLOVE EUDERMIC 7 POWDERFREE (GLOVE) ×4 IMPLANT
GLOVE ORTHO TXT STRL SZ7.5 (GLOVE) ×4 IMPLANT
GLOVE SURG SS PI 7.5 STRL IVOR (GLOVE) ×4 IMPLANT
GOWN STRL REUS W/ TWL LRG LVL3 (GOWN DISPOSABLE) ×6 IMPLANT
GOWN STRL REUS W/ TWL XL LVL3 (GOWN DISPOSABLE) ×12 IMPLANT
GOWN STRL REUS W/TWL LRG LVL3 (GOWN DISPOSABLE) ×12
GOWN STRL REUS W/TWL XL LVL3 (GOWN DISPOSABLE) ×24
GUIDEWIRE SAF TJ AMPL .035X180 (WIRE) ×4 IMPLANT
GUIDEWIRE STRAIGHT .035 260CM (WIRE) ×2 IMPLANT
INSERT FOGARTY 61MM (MISCELLANEOUS) ×4 IMPLANT
INSERT FOGARTY SM (MISCELLANEOUS) ×8 IMPLANT
INSERT FOGARTY XLG (MISCELLANEOUS) IMPLANT
KIT BASIN OR (CUSTOM PROCEDURE TRAY) ×4 IMPLANT
KIT DILATOR VASC 18G NDL (KITS) IMPLANT
KIT HEART LEFT (KITS) ×2 IMPLANT
KIT ROOM TURNOVER OR (KITS) ×4 IMPLANT
KIT SUCTION CATH 14FR (SUCTIONS) ×8 IMPLANT
NDL PERC 18GX7CM (NEEDLE) ×2 IMPLANT
NEEDLE PERC 18GX7CM (NEEDLE) ×4 IMPLANT
NS IRRIG 1000ML POUR BTL (IV SOLUTION) ×12 IMPLANT
PACK AORTA (CUSTOM PROCEDURE TRAY) ×4 IMPLANT
PAD ARMBOARD 7.5X6 YLW CONV (MISCELLANEOUS) ×8 IMPLANT
PAD ELECT DEFIB RADIOL ZOLL (MISCELLANEOUS) ×4 IMPLANT
PATCH TACHOSII LRG 9.5X4.8 (VASCULAR PRODUCTS) IMPLANT
SHEATH PINNACLE 6F 10CM (SHEATH) ×4 IMPLANT
SLEEVE REPOSITIONING LENGTH 30 (MISCELLANEOUS) ×2 IMPLANT
SPONGE GAUZE 4X4 12PLY STER LF (GAUZE/BANDAGES/DRESSINGS) ×2 IMPLANT
SPONGE LAP 4X18 X RAY DECT (DISPOSABLE) ×4 IMPLANT
STOPCOCK 4 WAY LG BORE MALE ST (IV SETS) ×2 IMPLANT
STOPCOCK MORSE 400PSI 3WAY (MISCELLANEOUS) ×6 IMPLANT
SUT ETHIBOND X763 2 0 SH 1 (SUTURE) ×4 IMPLANT
SUT GORETEX CV 4 TH 22 36 (SUTURE) ×4 IMPLANT
SUT GORETEX CV4 TH-18 (SUTURE) ×12 IMPLANT
SUT GORETEX TH-18 36 INCH (SUTURE) ×8 IMPLANT
SUT MNCRL AB 3-0 PS2 18 (SUTURE) ×4 IMPLANT
SUT PROLENE 3 0 SH1 36 (SUTURE) IMPLANT
SUT PROLENE 4 0 RB 1 (SUTURE) ×4
SUT PROLENE 4-0 RB1 .5 CRCL 36 (SUTURE) ×2 IMPLANT
SUT PROLENE 5 0 C 1 36 (SUTURE) ×8 IMPLANT
SUT PROLENE 6 0 C 1 30 (SUTURE) ×8 IMPLANT
SUT SILK  1 MH (SUTURE) ×2
SUT SILK 1 MH (SUTURE) ×2 IMPLANT
SUT SILK 2 0 SH CR/8 (SUTURE) IMPLANT
SUT VIC AB 2-0 CT1 27 (SUTURE) ×4
SUT VIC AB 2-0 CT1 TAPERPNT 27 (SUTURE) ×2 IMPLANT
SUT VIC AB 2-0 CTX 36 (SUTURE) IMPLANT
SUT VIC AB 3-0 SH 8-18 (SUTURE) ×10 IMPLANT
SYR 30ML LL (SYRINGE) ×8 IMPLANT
SYR 50ML LL SCALE MARK (SYRINGE) ×4 IMPLANT
SYRINGE 10CC LL (SYRINGE) ×6 IMPLANT
TAPE CLOTH SURG 4X10 WHT LF (GAUZE/BANDAGES/DRESSINGS) ×2 IMPLANT
TOWEL OR 17X26 10 PK STRL BLUE (TOWEL DISPOSABLE) ×8 IMPLANT
TRANSDUCER W/STOPCOCK (MISCELLANEOUS) ×4 IMPLANT
TRAY FOLEY IC TEMP SENS 14FR (CATHETERS) ×4 IMPLANT
TUBE SUCT INTRACARD DLP 20F (MISCELLANEOUS) IMPLANT
TUBING HIGH PRESSURE 120CM (CONNECTOR) ×4 IMPLANT
VALVE HEART TRANSCATH SZ3 26MM (Prosthesis & Implant Heart) ×2 IMPLANT
WIRE .035 3MM-J 145CM (WIRE) ×2 IMPLANT
WIRE AMPLATZ SS-J .035X180CM (WIRE) ×2 IMPLANT
WIRE AMPLATZ SS-J .035X260CM (WIRE) ×2 IMPLANT
WIRE J 3MM .035X145CM (WIRE) ×2 IMPLANT

## 2015-08-30 NOTE — Op Note (Signed)
CARDIOTHORACIC SURGERY OPERATIVE NOTE  Date of Procedure:  08/30/2015  Preoperative Diagnosis: Severe Aortic Stenosis   Postoperative Diagnosis: Same   Procedure:    Transcatheter Aortic Valve Replacement - Open Right Transfemoral Approach  Edwards Sapien 3 Transcatheter Heart Valve (size 26 mm, model # 9600TFX, serial # [id number])   Co-Surgeons:  Valentina Gu. Roxy Manns, MD and Sherren Mocha, MD  Assistants:   Jadene Pierini, PA-C  Anesthesiologist:  Albertha Ghee, MD  Echocardiographer:  Ena Dawley, MD  Pre-operative Echo Findings:  Severe aortic stenosis with moderate aortic insufficiency  Normal left ventricular systolic function  Post-operative Echo Findings:  No paravalvular leak  Normal left ventricular systolic function      DETAILS OF THE OPERATIVE PROCEDURE  The majority of the procedure is documented separately in a procedure note by Dr. Burt Knack.   TRANSFEMORAL ACCESS:   A small incision is made in the right groin immediately over the common femoral artery. The subcutaneous tissues are divided with electrocautery and the anterior surface of the common femoral artery is identified. Sharp dissection is utilized to free up the artery proximally and distally and an appropriate site for arterial puncture is identified.  A pair of CV-4 Gore-tex sutures are place as diamond-shaped purse-strings on the anterior surface of the femoral artery.  The patient is heparinized systemically and ACT verified > 250 seconds.  The common femoral artery is punctured using an 18 gauge needle and a soft J-tipped guidewire is passed into the common iliac artery under fluoroscopic guidance.  A 6 Fr straight diagnostic catheter is placed over the guidewire and the guidewire is removed.  An Amplatz super stiff guidewire is passed through the sheath into the descending thoracic aorta and the introducing diagnostic catheter is removed.  Serial dilators are passed over the guidewire under continuous  fluoroscopic guidance, making certain that each dilator passes easily all of the way into the distal abdominal aorta.  A 14 Fr Commander introducer sheath is passed over the guidewire into the abdominal aorta.  The introducing dilator is removed, the sheath is flushed with heparinized saline, and the sheath is secured to the skin.    FEMORAL SHEATH REMOVAL AND ARTERIAL CLOSURE:  After the completion of successful valve deployment as documented separately by Dr. Burt Knack, the femoral artery sheath is removed and the arteriotomy is closed using the previously placed Gore-tex purse-string sutures. Once the repair has been completed protamine was administered to reverse the anticoagulation. The incision is irrigated with saline solution and subsequently closed in multiple layers using absorbable suture.  The skin incision is closed using a subcuticular skin closure.     Valentina Gu. Roxy Manns MD 08/30/2015 2:09 PM

## 2015-08-30 NOTE — Progress Notes (Signed)
  Echocardiogram Echocardiogram Transesophageal has been performed.  Jennette Dubin 08/30/2015, 1:26 PM

## 2015-08-30 NOTE — Progress Notes (Signed)
PM Round:  Pt awake, alert without pain or dyspnea. BP stable but A-line and cuff are discordant, likely secondary to whip artifact in A-line tracing. Pt is intermittently pacing even with pacemaker set a backup rate of 40 bpm. Will leave pacemaker in place overnight. DC swan and A-line.  Catherine [name] 08/30/2015 5:12 PM

## 2015-08-30 NOTE — Progress Notes (Signed)
CT surgery PM rounds  Alert and comfortable Runs of PVCs with normal K- lidocaine ordered Pt with temp pacing wire Groin dressing dry

## 2015-08-30 NOTE — Transfer of Care (Signed)
Immediate Anesthesia Transfer of Care Note  Patient: CARMELL ELGIN  Procedure(s) Performed: Procedure(s): TRANSCATHETER AORTIC VALVE REPLACEMENT, TRANSFEMORAL (N/A) TRANSESOPHAGEAL ECHOCARDIOGRAM (TEE) (N/A)  Patient Location: PACU  Anesthesia Type:General  Level of Consciousness: awake, alert , oriented and patient cooperative  Airway & Oxygen Therapy: Patient Spontanous Breathing and Patient connected to nasal cannula oxygen  Post-op Assessment: Report given to RN, Post -op Vital signs reviewed and stable and Patient moving all extremities  Post vital signs: Reviewed and stable  Last Vitals:  Filed Vitals:   08/30/15 0907  BP: 118/53  Pulse: 58  Temp: 36.8 C  Resp: 18    Complications: No apparent anesthesia complications

## 2015-08-30 NOTE — Progress Notes (Signed)
Utilization Review Completed.Catherine [name], Catherine [name]  

## 2015-08-30 NOTE — Progress Notes (Signed)
Pt having frequent PVCs, K 4.3, Mag pending.  Dr Prescott Gum notified, orders received.  Will continue to monitor.  Vista Lawman, RN

## 2015-08-30 NOTE — Anesthesia Procedure Notes (Signed)
Procedure Name: Intubation Date/Time: 08/30/2015 11:29 AM Performed by: Rebekah Chesterfield L Pre-anesthesia Checklist: Patient identified, Emergency Drugs available, Suction available, Patient being monitored and Timeout performed Patient Re-evaluated:Patient Re-evaluated prior to inductionOxygen Delivery Method: Circle system utilized Preoxygenation: Pre-oxygenation with 100% oxygen Intubation Type: IV induction Ventilation: Oral airway inserted - appropriate to patient size and Mask ventilation with difficulty Laryngoscope Size: Mac and 3 Grade View: Grade II Tube type: Oral Tube size: 7.0 mm Number of attempts: 1 Airway Equipment and Method: Stylet Placement Confirmation: ETT inserted through vocal cords under direct vision,  positive ETCO2 and breath sounds checked- equal and bilateral Secured at: 20 cm Tube secured with: Tape Dental Injury: Teeth and Oropharynx as per pre-operative assessment

## 2015-08-30 NOTE — Progress Notes (Signed)
Patient was extubated when she arrived to the unit. Patient is on a 2L [place]. RT will continue to monitor patient.

## 2015-08-30 NOTE — Anesthesia Postprocedure Evaluation (Signed)
  Anesthesia Post-op Note  Patient: Catherine [name]  Procedure(s) Performed: Procedure(s): TRANSCATHETER AORTIC VALVE REPLACEMENT, TRANSFEMORAL (N/A) TRANSESOPHAGEAL ECHOCARDIOGRAM (TEE) (N/A)  Patient Location: ICU  Anesthesia Type:General  Level of Consciousness: awake and alert   Airway and Oxygen Therapy: Patient Spontanous Breathing  Post-op Pain: mild  Post-op Assessment: Post-op Vital signs reviewed, Patient's Cardiovascular Status Stable and Respiratory Function Stable              Post-op Vital Signs: Reviewed and stable  Last Vitals:  Filed Vitals:   08/30/15 0907  BP: 118/53  Pulse: 58  Temp: 36.8 C  Resp: 18    Complications: No apparent anesthesia complications

## 2015-08-30 NOTE — Anesthesia Preprocedure Evaluation (Signed)
Anesthesia Evaluation  Patient identified by MRN, date of birth, ID band Patient awake    Reviewed: Allergy & Precautions, NPO status , Patient's Chart, lab work & pertinent test results  Airway Mallampati: II   Neck ROM: full    Dental   Pulmonary shortness of breath,    breath sounds clear to auscultation       Cardiovascular hypertension, + Valvular Problems/Murmurs AS  Rhythm:regular Rate:Normal  Severe AS with AVA of 0.45.    Neuro/Psych PSYCHIATRIC DISORDERS Anxiety Depression  Neuromuscular disease    GI/Hepatic IBS.   Endo/Other  diabetes, Type 2Hypothyroidism obese  Renal/GU      Musculoskeletal  (+) Arthritis , Rheumatoid disorders,  Fibromyalgia -  Abdominal   Peds  Hematology   Anesthesia Other Findings   Reproductive/Obstetrics                             Anesthesia Physical Anesthesia Plan  ASA: IV  Anesthesia Plan: General   Post-op Pain Management:    Induction: Intravenous  Airway Management Planned: Oral ETT  Additional Equipment: Arterial line, CVP, PA Cath, TEE and Ultrasound Guidance Line Placement  Intra-op Plan:   Post-operative Plan: Extubation in OR and Possible Post-op intubation/ventilation  Informed Consent: I have reviewed the patients History and Physical, chart, labs and discussed the procedure including the risks, benefits and alternatives for the proposed anesthesia with the patient or authorized representative who has indicated his/her understanding and acceptance.     Plan Discussed with: CRNA, Anesthesiologist and Surgeon  Anesthesia Plan Comments:         Anesthesia Quick Evaluation

## 2015-08-30 NOTE — Interval H&P Note (Signed)
History and Physical Interval Note:  08/30/2015 10:52 AM  Catherine [name]  has presented today for surgery, with the diagnosis of SEVERE AS  The various methods of treatment have been discussed with the patient and family. After consideration of risks, benefits and other options for treatment, the patient has consented to  Procedure(s): TRANSCATHETER AORTIC VALVE REPLACEMENT, TRANSFEMORAL (N/A) TRANSESOPHAGEAL ECHOCARDIOGRAM (TEE) (N/A) as a surgical intervention .  The patient's history has been reviewed, patient examined, no change in status, stable for surgery.  I have reviewed the patient's chart and labs.  Questions were answered to the patient's satisfaction.    TAVR workup completed. Reviewed with TAVR team. Plan 26 mm Sapien valve via right TF approach. No change in patient status since this eval. Questions of patient and husband answered.  Sherren Mocha

## 2015-08-30 NOTE — Op Note (Addendum)
[facility]  [facility]R OPERATIVE NOTE   Date of Procedure:  08/30/2015  Preoperative Diagnosis: Severe Aortic Stenosis   Postoperative Diagnosis: Same   Procedure:   Transcatheter Aortic Valve Replacement - Transfemoral Approach  Edwards Sapien 3 THV (size 26 mm, model # 9600TFX, serial # [id number])   Co-Surgeons:  Valentina Gu. Roxy Manns, MD and Sherren Mocha, MD  Anesthesiologist:  Dr Marcie Bal  Echocardiographer:  Dr Meda Coffee  Pre-operative Echo Findings:  Severe aortic stenosis  Normal left ventricular systolic function  Post-operative Echo Findings:  No paravalvular leak  Normal left ventricular systolic function  BRIEF CLINICAL NOTE AND INDICATIONS FOR SURGERY  The patient is a 74 year old woman with rheumatoid arthritis, fibromyalgia, anemia, chronic depression, diabetes, hypertension and aortic stenosis that has been followed by echo. Her most recent echo on 07/25/2015 shows critical AS with a mean gradient of 64 mm Hg and an AVA of 0.46 cm2. LVEF is 55-60%. Cardiac cath on 08/11/2015 shows single vessel disease with a 70% mid LAD stenosis with heavy calcification of the entire LAD. There is mild non-obstructive disease in the RCA and LCX. She reports worsening exertional fatigue and increasing dizzy spells over the past year. She lives a sedentary lifestyle and her walking is slow and somewhat unsteady. She uses a cane for assistance. She has also been limited by problems with major depression and anxiety.  During the course of the patient's preoperative work up they have been evaluated comprehensively by a multidisciplinary team of specialists coordinated through the [facility] in the [facility].  They have been demonstrated to suffer from symptomatic severe aortic stenosis as noted above. The patient has been counseled extensively as to the relative risks and benefits of all options for the treatment of severe aortic  stenosis including long term medical therapy, conventional surgery for aortic valve replacement, and transcatheter aortic valve replacement.  The patient has been independently evaluated by two cardiac surgeons including Dr Roxy Manns and Dr. Cyndia Bent, and they are felt to be at moderate risk for conventional surgical aortic valve replacement based upon comorbidities including advanced age, numerous comorbid medical problems, obstructive CAD, diabetes, and very limited physical mobility.   Based upon review of all of the patient's preoperative diagnostic tests they are felt to be candidate for transcatheter aortic valve replacement using the transfemoral approach as an alternative to high risk conventional surgery.    Following the decision to proceed with transcatheter aortic valve replacement, a discussion has been held regarding what types of management strategies would be attempted intraoperatively in the event of life-threatening complications, including whether or not the patient would be considered a candidate for the use of cardiopulmonary bypass and/or conversion to open sternotomy for attempted surgical intervention.  The patient has been advised of a variety of complications that might develop peculiar to this approach including but not limited to risks of death, stroke, paravalvular leak, aortic dissection or other major vascular complications, aortic annulus rupture, device embolization, cardiac rupture or perforation, acute myocardial infarction, arrhythmia, heart block or bradycardia requiring permanent pacemaker placement, congestive heart failure, respiratory failure, renal failure, pneumonia, infection, other late complications related to structural valve deterioration or migration, or other complications that might ultimately cause a temporary or permanent loss of functional independence or other long term morbidity.  The patient provides full informed consent for the procedure as described and all  questions were answered preoperatively.  DETAILS OF THE OPERATIVE PROCEDURE  PREPARATION:  The patient is brought to the operating room on the above mentioned date and central monitoring was established by the anesthesia team including placement of Swan-Ganz catheter and radial arterial line. The patient is placed in the supine position on the operating table.  Intravenous antibiotics are administered. General endotracheal anesthesia is induced uneventfully. A Foley catheter is placed.  Baseline transesophageal echocardiogram was performed. The patient's chest, abdomen, both groins, and both lower extremities are prepared and draped in a sterile manner. A time out procedure is performed.   PERIPHERAL ACCESS:   Using the modified Seldinger technique, femoral arterial and venous access was obtained with placement of 6 Fr sheaths on the left side.  A pigtail diagnostic catheter was passed through the left femoral arterial sheath under fluoroscopic guidance into the aortic root.  A temporary transvenous pacemaker catheter was passed through the left femoral venous sheath under fluoroscopic guidance into the right ventricle.  The pacemaker was tested to ensure stable lead placement and pacemaker capture. Aortic root angiography was performed in order to determine the optimal angiographic angle for valve deployment.   TRANSFEMORAL ACCESS:   A right femoral arterial cutdown was performed by Dr Roxy Manns. Please see his separate operative note for details. The patient was heparinized systemically and ACT verified > 250 seconds.    A 14 Fr transfemoral E-sheath was introduced into the right femoral artery after progressively dilating over an Amplatz superstiff wire. An AL-1 catheter was used to direct a straight-tip exchange length wire across the native aortic valve into the left ventricle. This was exchanged out for a pigtail catheter and position was confirmed in the LV apex. Simultaneous LV and Ao  pressures were recorded.  The pigtail catheter was then exchanged for an Amplatz Extra-stiff wire in the LV apex. At that point, BAV was performed using a 23 mm valvuloplasty balloon.  Once optimal position was achieved, BAV was done under rapid ventricular pacing at 180 bpm. The patient recovered well hemodynamically.   TRANSCATHETER HEART VALVE DEPLOYMENT:  An Edwards Sapien 3 THV (size 26 mm) was prepared and crimped per manufacturer's guidelines, and the proper orientation of the valve is confirmed on the [employer] delivery system. The valve was advanced through the introducer sheath using normal technique until in an appropriate position in the abdominal aorta beyond the sheath tip. The balloon was then retracted and using the fine-tuning wheel was centered on the valve. The valve was then advanced across the aortic arch using appropriate flexion of the catheter. The valve was carefully positioned across the aortic valve annulus. The Commander catheter was retracted using normal technique. Once final position of the valve has been confirmed by angiographic assessment, the valve is deployed while temporarily holding ventilation and during rapid ventricular pacing to maintain systolic blood pressure < 50 mmHg and pulse pressure < 10 mmHg. The balloon inflation is held for >3 seconds after reaching full deployment volume. Once the balloon has fully deflated the balloon is retracted into the ascending aorta and valve function is assessed using TEE. There is felt to be no paravalvular leak and no central aortic insufficiency.  The patient's hemodynamic recovery following valve deployment is good.  The deployment balloon and guidewire are both removed. Echo demostrated acceptable post-procedural gradients, stable mitral valve function, and no AI.   PROCEDURE COMPLETION:  The sheath was then removed and arteriotomy repaired by Dr Roxy Manns. Please see his separate report for details.  Protamine was  administered once femoral arterial repair was complete. The  pigtail catheters and femoral sheath were removed with manual pressure used for hemostasis. The temporary transvenous pacemaker is left in place and will be removed in approximately 2 hours if the patient's heart rhythm is stable.  The patient tolerated the procedure well and is transported to the surgical intensive care in stable condition. There were no immediate intraoperative complications. All sponge instrument and needle counts are verified correct at completion of the operation.   The patient received a total of 81 mL of intravenous contrast during the procedure.  Sherren Mocha MD 08/30/2015 3:10 PM

## 2015-08-30 NOTE — H&P (View-Only) (Signed)
Cardiology Office Note Date:  08/02/2015   ID:  Catherine [name] 02-Jan-1941, MRN [id number]  PCP:  Jilda Panda, MD  Cardiologist:  Sherren Mocha, MD    Chief Complaint  Patient presents with  . Dizziness    History of Present Illness: Catherine [name] is a 74 y.o. female who presents for evaluation of aortic stenosis.  The patient has been noted to have a cardiac murmur for the past 1-2 years. She underwent an echocardiogram recently demonstrating the presence of critical aortic valve stenosis with a mean transvalvular gradient of 64 mmHg and an estimated aortic valve area of 0.46 cm. LV function was normal with an ejection fraction of 55-60%. She was seen yesterday by Dr. Roxy Manns for cardiac surgical consultation.  The patient has had a long-standing sedentary lifestyle because of limitation related to rheumatoid arthritis, fibromyalgia, and chronic back pain. Also with knee, foot, and shoulder pain. She ambulates with a cane. Symptoms include dyspnea with physical exertion and lightheadedness. She has not had frank syncope. She denies chest pain, chest pressure, or any symptoms suggestive of angina.  The patient is here with her husband today.  Medical problems include long-standing type 2 diabetes and rheumatoid arthritis. She has undergone left mastectomy in the 1980s for treatment of breast cancer.  Past Medical History  Diagnosis Date  . Diabetes mellitus type II   . Thyroid disease   . IBS (irritable bowel syndrome)   . Anemia   . Colon polyps   . Fibromyalgia   . Rheumatoid arthritis(714.0)   . HTN (hypertension)   . Hypercholesteremia   . Acute colitis   . Rectal bleeding     Past Surgical History  Procedure Laterality Date  . Self inflicted chest wound    . Polyp     . Total abdominal hysterectomy    . Cesarean section      x 2  . Left oophorectomy    . Abdominal surgery      Intestinal surgery  . Mastectomy Left   . Spine surgery  1981    lower back     Current Outpatient Prescriptions  Medication Sig Dispense Refill  . aspirin 81 MG tablet Take 81 mg by mouth daily.    . Cholecalciferol (VITAMIN D3) 2000 UNITS TABS Take 1 tablet by mouth daily.    . citalopram (CELEXA) 40 MG tablet Take 40 mg by mouth daily.    . clonazePAM (KLONOPIN) 1 MG tablet Take 1 mg by mouth daily.    Marland Kitchen levothyroxine (SYNTHROID, LEVOTHROID) 100 MCG tablet Take 100 mcg by mouth daily.    . Linagliptin-Metformin HCl (JENTADUETO) 2.5-500 MG TABS Take 1 tablet by mouth.    Marland Kitchen lisinopril (PRINIVIL,ZESTRIL) 20 MG tablet Take 20 mg by mouth 2 (two) times daily.    . metoprolol tartrate (LOPRESSOR) 25 MG tablet Take 25 mg by mouth every morning.    . potassium chloride SA (K-DUR,KLOR-CON) 20 MEQ tablet Take 20 mEq by mouth 2 (two) times daily.    . rosuvastatin (CRESTOR) 20 MG tablet Take 20 mg by mouth daily.    . traMADol (ULTRAM) 50 MG tablet Take 50 mg by mouth every 6 (six) hours as needed.     No current facility-administered medications for this visit.    Allergies:   Sulfa antibiotics   Social History:  The patient  reports that she has never smoked. She does not have any smokeless tobacco history on file. She reports that she does not  drink alcohol or use illicit drugs.   Family History:  The patient's  family history includes Depression in her sister, sister, and sister; Lung cancer in her sister.    ROS:  Please see the history of present illness.  Otherwise, review of systems is positive for leg swelling, DOE, depression, back pain, muscle pain, dizziness, fatigue, leg pain, constipation, anxiety, joint swelling, balance problems, headaches.  All other systems are reviewed and negative.    PHYSICAL EXAM: VS:  BP 110/59 mmHg  Pulse 63  Wt 191 lb (86.637 kg) , BMI Body mass index is 33.84 kg/(m^2). GEN: Well nourished, well developed, in no acute distress HEENT: normal Neck: no JVD, no masses. Delayed carotids bilaterally Cardiac: RRR with  Late  peaking grade 3/6 harsh systolic murmur at the right upper sternal border with diminished A2 component                Respiratory:  clear to auscultation bilaterally, normal work of breathing GI: soft, nontender, nondistended, + BS MS: no deformity or atrophy Ext:  trace pretibial edema, pedal pulses 2+= bilaterally Skin: warm and dry, no rash Neuro:  Strength and sensation are intact Psych: euthymic mood, full affect  EKG:  EKG is not ordered today.  EKG from 06/27/2015 shows normal sinus rhythm with right bundle branch block.  Recent Labs: 08/02/2015: BUN 20; Creatinine, Ser 0.72; Hemoglobin 12.1; Platelets 253.0; Potassium 4.2; Sodium 137   Lipid Panel  No results found for: CHOL, TRIG, HDL, CHOLHDL, VLDL, LDLCALC, LDLDIRECT    Wt Readings from Last 3 Encounters:  08/02/15 191 lb (86.637 kg)  08/01/15 180 lb (81.647 kg)  02/26/14 197 lb (89.359 kg)   Cardiac Studies Reviewed: 2D Echo: Study Conclusions  - Left ventricle: The cavity size was normal. There was moderate concentric hypertrophy. Systolic function was normal. The estimated ejection fraction was in the range of 55% to 60%. Wall motion was normal; there were no regional wall motion abnormalities. Doppler parameters are consistent with abnormal left ventricular relaxation (grade 1 diastolic dysfunction). - Aortic valve: There was critical stenosis. There was mild regurgitation. Mean gradient (S): 64 mm Hg. Valve area (VTI): 0.46 cm^2. - Mitral valve: There was mild regurgitation. - Left atrium: The atrium was mildly dilated. - Right atrium: The atrium was mildly dilated. - Pulmonary arteries: Systolic pressure was within the normal range. PA peak pressure: 32 mm Hg (S).  Impressions:  - recommend urgent cardiac evaluation for aortic valve replacement.  STS Risk Calculator ProcedureAVR  Risk of Mortality2.3% Morbidity  or Mortality14.6% Prolonged LOS5.8% Short LOS32.2% Permanent Stroke1.9% Prolonged Vent Support10.2% DSW Infection0.2% Renal Failure4.3% Reoperation6.0%  ASSESSMENT AND PLAN: 74 yo woman with Stage D critical aortic stenosis and associated NYHA Functional Class II symptoms.  I have reviewed her echocardiogram and this demonstrates severe calcification , thickening, and leaflet restriction of all 3 aortic valve leaflets. LV function is well-preserved. There is no other significant valvular disease noted.  I have reviewed the natural history of aortic stenosis with the patient and her fusband who is present today. We have discussed the limitations of medical therapy and the poor prognosis associated with symptomatic aortic stenosis. We have also reviewed potential treatment options, including palliative medical therapy, conventional surgical aortic valve replacement, and transcatheter aortic valve replacement. We discussed treatment options in the context of this patient's specific comorbid medical conditions. I agree with Dr Roxy Manns that this patient's symptoms of worsening dizzy spells is concerning in the context of critical AS. Aortic  valve replacement is clearly indicated and workup has been initiated. Will plan to proceed with right and left heart catheterization to evaluate for the presence of obstructive coronary artery disease and assess intracardiac hemodynamics. I have reviewed the risks, indications, and alternatives to cardiac catheterization with the patient. Risks include but are not limited to bleeding, infection, vascular injury, stroke, myocardial infection, arrhythmia, kidney injury, radiation-related injury in the case of  prolonged fluoroscopy use, emergency cardiac surgery, and death. The patient understands the risks of serious complication is low (<4%).  Further plans pending the patient's cardiac catheterization results. If she does not have obstructive CAD, TAVR may be an attractive treatment option in this patient who has advanced arthritis and long-standing sedentary lifestyle.  Current medicines are reviewed with the patient today.  The patient does not have concerns regarding medicines.  Labs/ tests ordered today include:   Orders Placed This Encounter  Procedures  . Basic Metabolic Panel (BMET)  . CBC  . INR/PT    Disposition:   FU pending catheterization results  Signed, Sherren Mocha, MD  08/02/2015 5:38 PM    [address], [place], [place]  [address] Phone: [phone]; Fax: [phone]

## 2015-08-31 ENCOUNTER — Ambulatory Visit (HOSPITAL_COMMUNITY): Payer: Medicare Other

## 2015-08-31 ENCOUNTER — Encounter (HOSPITAL_COMMUNITY): Payer: Self-pay | Admitting: Cardiovascular Disease

## 2015-08-31 ENCOUNTER — Inpatient Hospital Stay (HOSPITAL_COMMUNITY): Payer: Medicare Other

## 2015-08-31 ENCOUNTER — Encounter (HOSPITAL_COMMUNITY)
Admission: RE | Disposition: A | Discharge status: Home / Self Care | Payer: Self-pay | Source: Ambulatory Visit | Attending: Cardiovascular Disease

## 2015-08-31 DIAGNOSIS — I442 Atrioventricular block, complete: Secondary | ICD-10-CM

## 2015-08-31 DIAGNOSIS — I2119 ST elevation (STEMI) myocardial infarction involving other coronary artery of inferior wall: Secondary | ICD-10-CM

## 2015-08-31 HISTORY — PX: EP IMPLANTABLE DEVICE: SHX172B

## 2015-08-31 HISTORY — DX: Atrioventricular block, complete: I44.2

## 2015-08-31 LAB — BASIC METABOLIC PANEL
Anion gap: 5 (ref 5–15)
BUN: 13 mg/dL (ref 6–20)
CHLORIDE: 102 mmol/L (ref 101–111)
CO2: 28 mmol/L (ref 22–32)
CREATININE: 0.7 mg/dL (ref 0.44–1.00)
Calcium: 8.4 mg/dL — ABNORMAL LOW (ref 8.9–10.3)
GFR calc Af Amer: >60 mL/min (ref >60)
GFR calc non Af Amer: >60 mL/min (ref >60)
GLUCOSE: 100 mg/dL — AB (ref 65–99)
Potassium: 3.9 mmol/L (ref 3.5–5.1)
Sodium: 135 mmol/L (ref 135–145)

## 2015-08-31 LAB — CBC
HEMATOCRIT: 31.6 % — AB (ref 36.0–46.0)
Hemoglobin: 9.9 g/dL — ABNORMAL LOW (ref 12.0–15.0)
MCH: 27.1 pg (ref 26.0–34.0)
MCHC: 31.3 g/dL (ref 30.0–36.0)
MCV: 86.6 fL (ref 78.0–100.0)
Platelets: 160 10*3/uL (ref 150–400)
RBC: 3.65 MIL/uL — ABNORMAL LOW (ref 3.87–5.11)
RDW: 15.5 % (ref 11.5–15.5)
WBC: 8.3 10*3/uL (ref 4.0–10.5)

## 2015-08-31 LAB — MAGNESIUM: Magnesium: 1.9 mg/dL (ref 1.7–2.4)

## 2015-08-31 LAB — GLUCOSE, CAPILLARY: GLUCOSE-CAPILLARY: 159 mg/dL — AB (ref 65–99)

## 2015-08-31 SURGERY — PACEMAKER IMPLANT

## 2015-08-31 MED ORDER — MIDAZOLAM HCL 5 MG/5ML IJ SOLN
INTRAMUSCULAR | Status: AC
  Filled 2015-08-31: qty 25

## 2015-08-31 MED ORDER — ONDANSETRON HCL 4 MG/2ML IJ SOLN: 4.0000 mg | Freq: Four times a day (QID) | INTRAMUSCULAR | Status: DC | PRN

## 2015-08-31 MED ORDER — CEFAZOLIN SODIUM-DEXTROSE 2-3 GM-% IV SOLR
INTRAVENOUS | Status: AC
  Filled 2015-08-31: qty 50

## 2015-08-31 MED ORDER — ACETAMINOPHEN 325 MG PO TABS
325.0000 mg | ORAL_TABLET | ORAL | Status: DC | PRN
  Administered 2015-09-01 – 2015-09-06 (×3): 650 mg via ORAL
  Filled 2015-08-31 (×3): qty 2

## 2015-08-31 MED ORDER — HEPARIN (PORCINE) IN NACL 2-0.9 UNIT/ML-% IJ SOLN
INTRAMUSCULAR | Status: AC
  Filled 2015-08-31: qty 500

## 2015-08-31 MED ORDER — CEFAZOLIN SODIUM-DEXTROSE 2-3 GM-% IV SOLR
INTRAVENOUS | Status: DC | PRN
  Administered 2015-08-31: 2 g via INTRAVENOUS

## 2015-08-31 MED ORDER — CEFAZOLIN SODIUM 1-5 GM-% IV SOLN
1.0000 g | Freq: Four times a day (QID) | INTRAVENOUS | Status: AC
  Administered 2015-08-31 – 2015-09-01 (×3): 1 g via INTRAVENOUS
  Filled 2015-08-31 (×4): qty 50

## 2015-08-31 MED ORDER — CHLORHEXIDINE GLUCONATE 4 % EX LIQD
60.0000 mL | Freq: Once | CUTANEOUS | Status: AC
  Administered 2015-08-31: 4 via TOPICAL
  Filled 2015-08-31: qty 60

## 2015-08-31 MED ORDER — MIDAZOLAM HCL 2 MG/2ML IJ SOLN
INTRAMUSCULAR | Status: DC | PRN
  Administered 2015-08-31 (×3): 1 mg via INTRAVENOUS

## 2015-08-31 MED ORDER — MORPHINE SULFATE (PF) 2 MG/ML IV SOLN
1.0000 mg | INTRAVENOUS | Status: DC | PRN
  Administered 2015-08-31 – 2015-09-02 (×2): 1 mg via INTRAVENOUS
  Filled 2015-08-31 (×2): qty 1

## 2015-08-31 MED ORDER — CEFAZOLIN SODIUM-DEXTROSE 2-3 GM-% IV SOLR: 2.0000 g | INTRAVENOUS | Status: DC

## 2015-08-31 MED ORDER — SODIUM CHLORIDE 0.9 % IR SOLN
80.0000 mg | Status: AC
  Administered 2015-08-31: 80 mg

## 2015-08-31 MED ORDER — CEFAZOLIN SODIUM-DEXTROSE 2-3 GM-% IV SOLR
2.0000 g | INTRAVENOUS | Status: DC
  Filled 2015-08-31: qty 50

## 2015-08-31 MED ORDER — LIDOCAINE HCL (PF) 1 % IJ SOLN
INTRAMUSCULAR | Status: AC
  Filled 2015-08-31: qty 60

## 2015-08-31 MED ORDER — LIDOCAINE HCL (PF) 1 % IJ SOLN
INTRAMUSCULAR | Status: DC | PRN
  Administered 2015-08-31: 13:00:00

## 2015-08-31 MED ORDER — SODIUM CHLORIDE 0.9 % IV SOLN
INTRAVENOUS | Status: DC | PRN
  Administered 2015-08-31: 20 mL via INTRAVENOUS

## 2015-08-31 MED ORDER — SODIUM CHLORIDE 0.9 % IV SOLN: INTRAVENOUS | Status: DC

## 2015-08-31 MED ORDER — FENTANYL CITRATE (PF) 100 MCG/2ML IJ SOLN
INTRAMUSCULAR | Status: AC
  Filled 2015-08-31: qty 4

## 2015-08-31 MED ORDER — GENTAMICIN SULFATE 40 MG/ML IJ SOLN
80.0000 mg | INTRAMUSCULAR | Status: DC
  Filled 2015-08-31: qty 2

## 2015-08-31 MED ORDER — FENTANYL CITRATE (PF) 100 MCG/2ML IJ SOLN
INTRAMUSCULAR | Status: DC | PRN
  Administered 2015-08-31: 25 ug via INTRAVENOUS
  Administered 2015-08-31 (×2): 12.5 ug via INTRAVENOUS

## 2015-08-31 MED FILL — Phenylephrine HCl Inj 10 MG/ML: INTRAMUSCULAR | Qty: 2 | Status: AC

## 2015-08-31 MED FILL — Dextrose Inj 5%: INTRAVENOUS | Qty: 250 | Status: AC

## 2015-08-31 SURGICAL SUPPLY — 7 items
CABLE SURGICAL S-101-97-12 (CABLE) ×2 IMPLANT
HOVERMATT SINGLE USE (MISCELLANEOUS) ×2 IMPLANT
LEAD TENDRIL SDX 2088TC-46CM (Lead) ×2 IMPLANT
LEAD TENDRIL SDX 2088TC-52CM (Lead) ×2 IMPLANT
PPM ASSURITY DR PM2240 (Pacemaker) ×2 IMPLANT
SHEATH CLASSIC 7F (SHEATH) ×4 IMPLANT
TRAY PACEMAKER INSERTION (CUSTOM PROCEDURE TRAY) ×2 IMPLANT

## 2015-08-31 NOTE — Consult Note (Signed)
ELECTROPHYSIOLOGY CONSULT NOTE    Patient ID: Catherine [name] MRN: [id number], DOB/AGE: 02-28-1941 74 y.o.  Admit date: 08/30/2015 Date of Consult: 08/31/2015   Primary Physician: Jilda Panda, MD Primary Cardiologist: Dr. Burt Knack  Reason for Consultation: CHB post TAVR for PPM   HPI: Catherine [name] is a 74 y.o. female with known severe and symptomatic AS, she was w/u out patient with echos, and cardiac cath, and is now s/p TAVR done 08/30/15.  She has been observed to have persistent CHB and is pacing 100% with her temporary pacer and EP was called to evaluate for PPM implant.  The patient this morning feels well, denies any CP, palpitations or SOB.  No dizziness or syncope.  Her PMHx is DM, HTN, dyslipidemia, RA, IBS/colitis and breast cancer treated surgically.   Past Medical History  Diagnosis Date  . Diabetes mellitus type II   . Thyroid disease   . IBS (irritable bowel syndrome)   . Anemia   . Colon polyps   . Fibromyalgia   . Rheumatoid arthritis(714.0)   . HTN (hypertension)   . Hypercholesteremia   . Acute colitis   . Rectal bleeding   . Stenosis of aortic valve   . Depression   . Shortness of breath dyspnea   . Cancer     breast  . Hypothyroidism   . Chronic diastolic congestive heart failure   . S/P TAVR (transcatheter aortic valve replacement) 08/30/2015    26 mm Edwards Sapien 3 transcatheter heart valve placed via open right transfemoral approach     Surgical History:  Past Surgical History  Procedure Laterality Date  . Self inflicted chest wound    . Polyp     . Total abdominal hysterectomy    . Cesarean section      x 2  . Left oophorectomy    . Abdominal surgery      Intestinal surgery  . Mastectomy Left   . Spine surgery  1981    lower back     Prescriptions prior to admission  Medication Sig Dispense Refill Last Dose  . aspirin EC 81 MG tablet Take 81 mg by mouth daily.   08/30/2015 at 0600  . atorvastatin (LIPITOR) 40 MG tablet Take 20 mg  by mouth daily with lunch.   08/29/2015 at Unknown time  . Cholecalciferol (VITAMIN D3) 2000 UNITS TABS Take 2,000 Units by mouth daily with lunch.    08/29/2015 at Unknown time  . citalopram (CELEXA) 40 MG tablet Take 20 mg by mouth daily with lunch.    08/30/2015 at 0600  . clonazePAM (KLONOPIN) 1 MG tablet Take 1 mg by mouth at bedtime.    08/29/2015 at Unknown time  . levothyroxine (SYNTHROID, LEVOTHROID) 100 MCG tablet Take 100 mcg by mouth daily.   08/30/2015 at 0600  . lisinopril (PRINIVIL,ZESTRIL) 20 MG tablet Take 20 mg by mouth 2 (two) times daily.   08/29/2015 at Unknown time  . metoprolol tartrate (LOPRESSOR) 25 MG tablet Take 25 mg by mouth daily.    08/30/2015 at 0600  . Multiple Vitamin (MULTIVITAMIN WITH MINERALS) TABS tablet Take 1 tablet by mouth daily with lunch.   08/29/2015 at Unknown time  . traMADol (ULTRAM) 50 MG tablet Take 50 mg by mouth every 6 (six) hours as needed (pain).    Past Week at Unknown time  . Linagliptin-Metformin HCl (JENTADUETO) 2.5-500 MG TABS Take 1 tablet by mouth daily with supper.    08/28/2015    Inpatient Medications:  .  acetaminophen  1,000 mg Oral 4 times per day   Or  . acetaminophen (TYLENOL) oral liquid 160 mg/5 mL  1,000 mg Per Tube 4 times per day  . aspirin EC  81 mg Oral Daily  . atorvastatin  20 mg Oral Q lunch  . cefUROXime (ZINACEF)  IV  1.5 g Intravenous Q12H  . citalopram  20 mg Oral Q lunch  . clonazePAM  1 mg Oral QHS  . clopidogrel  75 mg Oral Q breakfast  . insulin regular  0-10 Units Intravenous TID WC  . levothyroxine  100 mcg Oral QAC breakfast  . nitroGLYCERIN  2-200 mcg/min Intravenous To OR  . [START ON 09/01/2015] pantoprazole  40 mg Oral Daily  . sodium chloride  3 mL Intravenous Q12H    Allergies:  Allergies  Allergen Reactions  . Sulfa Antibiotics Hives  . Latex Rash    Social History   Social History  . Marital Status: Married    Spouse Name: N/A  . Number of Children: N/A  . Years of Education: N/A    Occupational History  . Not on file.   Social History Main Topics  . Smoking status: Never Smoker   . Smokeless tobacco: Not on file  . Alcohol Use: No  . Drug Use: No  . Sexual Activity: Not on file   Other Topics Concern  . Not on file   Social History Narrative     Family History  Problem Relation Age of Onset  . Depression Sister   . Depression Sister   . Depression Sister   . Lung cancer Sister      Review of Systems: General: No chills, fever, night sweats or weight changes  Cardiovascular:  No chest pain, dyspnea on exertion, edema, orthopnea, palpitations, paroxysmal nocturnal dyspnea Dermatological: No rash, lesions or masses Respiratory: No cough, dyspnea Urologic: No hematuria, dysuria Abdominal: No nausea, vomiting, diarrhea, bright red blood per rectum, melena, or hematemesis Neurologic: No visual changes, weakness, changes in mental status All other systems reviewed and are otherwise negative except as noted above.  Physical Exam: Filed Vitals:   08/31/15 0400 08/31/15 0500 08/31/15 0600 08/31/15 0700  BP: 107/38 127/32 112/81 131/41  Pulse: 43 58 59 59  Temp:      TempSrc:      Resp: 17 18 18 19   Height:      Weight:   188 lb 7.9 oz (85.5 kg)   SpO2: 98% 96% 96% 97%    GEN- The patient is well appearing, alert and oriented x 3 today.   HEENT: normocephalic, atraumatic; sclera clear, conjunctiva pink; hearing intact; oropharynx clear Lungs- Clear to ausculation bilaterally, normal work of breathing.  No wheezes, rales, rhonchi Heart- Regular rate and rhythm, 1/6 murmurs, rubs or gallops, PMI not laterally displaced GI- soft, non-tender, non-distended, bowel sounds present, no hepatosplenomegaly Extremities- no clubbing, cyanosis, or edema; DP/PT/radial pulses 2+ bilaterally MS- no significant deformity or atrophy Skin- warm and dry, no rash or lesion Psych- euthymic mood, full affect Neuro- intact grossly  Labs:   Lab Results  Component  Value Date   WBC 8.3 08/31/2015   HGB 9.9* 08/31/2015   HCT 31.6* 08/31/2015   MCV 86.6 08/31/2015   PLT 160 08/31/2015    Recent Labs Lab 08/25/15 1417  08/31/15 0420  NA 139  < > 135  K 4.4  < > 3.9  CL 104  < > 102  CO2 28  --  28  BUN 14  < >  13  CREATININE 0.73  < > 0.70  CALCIUM 9.9  --  8.4*  PROT 6.6  --   --   BILITOT 0.7  --   --   ALKPHOS 57  --   --   ALT 8*  --   --   AST 14*  --   --   GLUCOSE 101*  < > 100*  < > = values in this interval not displayed.    Radiology/Studies:   Dg Chest Port 1 View  2015/09/22   CLINICAL DATA:  Severe aortic stenosis.  EXAM: PORTABLE CHEST 1 VIEW  COMPARISON:  08/25/2015.  FINDINGS: The patient has undergone transcatheter aortic valve replacement. Grossly satisfactory positioning of the valve over the heart. Cardiomegaly. Swan-Ganz catheter tip RIGHT main pulmonary artery. Probable temporary pacer lead from a femoral approach, lying within the RIGHT ventricle. Mild vascular congestion. No infiltrates or overt edema. No osseous findings.  IMPRESSION: Satisfactory postoperative TAVR.   Electronically Signed   By: Staci Righter M.D.   On: 09-22-15 15:22   07/25/15 Echo: - Left ventricle: The cavity size was normal. There was moderate concentric hypertrophy. Systolic function was normal. The estimated ejection fraction was in the range of 55% to 60%. Wall motion was normal; there were no regional wall motion abnormalities. Doppler parameters are consistent with abnormal left ventricular relaxation (grade 1 diastolic dysfunction). - Aortic valve: There was critical stenosis. There was mild regurgitation. Mean gradient (S): 64 mm Hg. Valve area (VTI): 0.46 cm^2. - Mitral valve: There was mild regurgitation. - Left atrium: The atrium was mildly dilated. - Right atrium: The atrium was mildly dilated. - Pulmonary arteries: Systolic pressure was within the normal range. PA peak pressure: 32 mm Hg (S).  08/11/15: Cardiac cath 1.  Single-vessel coronary artery disease with heavy calcification of the entire LAD and a moderate eccentric mid LAD stenosis, estimated at 70% 2. Mild nonobstructive stenosis of the RCA and left circumflex 3. Severely calcified aortic valve with restricted valve mobility and 2+ aortic insufficiency, and known severe aortic stenosis by noninvasive evaluation  Continued multidisciplinary evaluation for definitive treatment of severe aortic stenosis   EKG: 09-22-15: SR, 1st degree AVblock, RBBB  TELEMETRY: Paced  Assesment and Plan:  1. Critical AS s/p TAVR Sep 22, 2015     BP stable     Continue as per primary cardiac team 2. Post TAVR persistent CHB     Temp pacer in place      Last metoprolol dose >24 hours ago, no other nodal blocking meds      Plan for PPM implant today 3. HTN     stable    Signed, Tommye Standard, PA-C  08/31/2015 8:09 AM  I have seen and examined this patient with Tommye Standard.  Agree with above, note added to reflect my findings.  On exam, regular rhythm, 2/6 systolic murmur RUSB, lungs clear.  Had TAVR with Sapien valve placed yesterday.  Has complete heart block.  Pacing at 30 shows no escape rhythm.  Plan for pacemaker today.  Procedure was explained to the patient including risks and benefits.  Patient agrees to the procedure.    Will M. Camnitz MD 08/31/2015 8:47 AM

## 2015-08-31 NOTE — Progress Notes (Addendum)
T[facility] DAILY ICU PROGRESS NOTE                   [address]            [place] [address]          [phone]   1 Day Post-Op Procedure(s) (LRB): TRANSCATHETER AORTIC VALVE REPLACEMENT, TRANSFEMORAL (N/A) TRANSESOPHAGEAL ECHOCARDIOGRAM (TEE) (N/A)  Total Length of Stay:  LOS: 1 day   Subjective: Sore in groin, but otherwise no complaints. Paced overnight due to CHB.  Objective: Vital signs in last 24 hours: Temp:  [96.1 F (35.6 C)-98.3 F (36.8 C)] 98.3 F (36.8 C) (09/28 0359) Pulse Rate:  [42-59] 59 (09/28 0700) Cardiac Rhythm:  [-] Sinus bradycardia;Ventricular paced (09/28 0400) Resp:  [11-21] 19 (09/28 0700) BP: (106-134)/(29-81) 131/41 mmHg (09/28 0700) SpO2:  [96 %-100 %] 97 % (09/28 0700) Arterial Line BP: (129-162)/(38-55) 162/44 mmHg (09/27 1700) Weight:  [188 lb 7.9 oz (85.5 kg)-190 lb (86.183 kg)] 188 lb 7.9 oz (85.5 kg) (09/28 0600)  Filed Weights   08/30/15 2200 08/31/15 0600  Weight: 190 lb (86.183 kg) 188 lb 7.9 oz (85.5 kg)    Weight change:    Hemodynamic parameters for last 24 hours: PAP: (26-33)/(14-17) 31/15 mmHg CO:  [3.7 L/min-4.7 L/min] 3.7 L/min CI:  [1.9 L/min/m2-2.5 L/min/m2] 1.9 L/min/m2  Intake/Output from previous day: 09/27 0701 - 09/28 0700 In: 2847.1 [P.O.:240; I.V.:2057.1; IV Piggyback:550] Out: 2536 [Urine:1740]  Intake/Output this shift:    Current Meds: Scheduled Meds: . acetaminophen  1,000 mg Oral 4 times per day   Or  . acetaminophen (TYLENOL) oral liquid 160 mg/5 mL  1,000 mg Per Tube 4 times per day  . aspirin EC  81 mg Oral Daily  . atorvastatin  20 mg Oral Q lunch  . cefUROXime (ZINACEF)  IV  1.5 g Intravenous Q12H  . citalopram  20 mg Oral Q lunch  . clonazePAM  1 mg Oral QHS  . clopidogrel  75 mg Oral Q breakfast  . insulin regular  0-10 Units Intravenous TID WC  . levothyroxine  100 mcg Oral QAC breakfast  . nitroGLYCERIN  2-200 mcg/min Intravenous To OR  . [START ON 09/01/2015]  pantoprazole  40 mg Oral Daily  . sodium chloride  3 mL Intravenous Q12H   Continuous Infusions: . dexmedetomidine Stopped (08/30/15 1430)  . insulin (NOVOLIN-R) infusion    . nitroGLYCERIN 10 mcg/min (08/30/15 1600)  . phenylephrine (NEO-SYNEPHRINE) Adult infusion Stopped (08/30/15 1430)   PRN Meds:.sodium chloride, albumin human, lactated ringers, metoprolol, morphine injection, ondansetron (ZOFRAN) IV, oxyCODONE, sodium chloride, traMADol  Physical Exam: General appearance: alert, cooperative and no distress Heart: Paced at 60 Lungs: clear to auscultation bilaterally Extremities: No edema Wound: Groin wound clean and dry   Lab Results: CBC: Recent Labs  08/30/15 1950 08/31/15 0420  WBC 9.4 8.3  HGB 10.1* 9.9*  HCT 32.4* 31.6*  PLT 154 160   BMET:  Recent Labs  08/30/15 1947 08/30/15 1950 08/31/15 0420  NA 141  --  135  K 4.3  --  3.9  CL 103  --  102  CO2  --   --  28  GLUCOSE 115*  --  100*  BUN 15  --  13  CREATININE 0.70 0.70 0.70  CALCIUM  --   --  8.4*    PT/INR:  Recent Labs  08/30/15 1430  LABPROT 16.3*  INR 1.29   Radiology: Dg Chest [facility] 1 [address]  08/31/2015   CLINICAL DATA:  Postop valve repair.  EXAM: PORTABLE CHEST 1 VIEW  COMPARISON:  08/30/2015  FINDINGS: Transvenous pacer wire tip is in the right ventricle, entering from the IVC, stable. Changes of aortic valve repair interval removal of Swan-Ganz catheter. Mild cardiomegaly. No confluent opacities or effusions. No acute bony abnormality. No pneumothorax.  IMPRESSION: No acute findings.  Mild cardiomegaly.   Electronically Signed   By: Rolm Baptise M.D.   On: 08/31/2015 07:35   Dg Chest Port 1 View  08/30/2015   CLINICAL DATA:  Severe aortic stenosis.  EXAM: PORTABLE CHEST 1 VIEW  COMPARISON:  08/25/2015.  FINDINGS: The patient has undergone transcatheter aortic valve replacement. Grossly satisfactory positioning of the valve over the heart. Cardiomegaly. Swan-Ganz catheter tip RIGHT main  pulmonary artery. Probable temporary pacer lead from a femoral approach, lying within the RIGHT ventricle. Mild vascular congestion. No infiltrates or overt edema. No osseous findings.  IMPRESSION: Satisfactory postoperative TAVR.   Electronically Signed   By: Staci Righter M.D.   On: 08/30/2015 15:22     Assessment/Plan: S/P Procedure(s) (LRB): TRANSCATHETER AORTIC VALVE REPLACEMENT, TRANSFEMORAL (N/A) TRANSESOPHAGEAL ECHOCARDIOGRAM (TEE) (N/A) CHB- per Dr. Burt Knack, EP will see this am for possible PPM. Continue current care. Doing well overall POD #1.   COLLINS,GINA H 08/31/2015 8:07 AM  I have seen and examined the patient and agree with the assessment and plan as outlined.  Pre-op chronic RBBB - now remains in CHB post-TAVR.  For permanent pacer implantation later today.  Otherwise doing well.  I spent in excess of 15 minutes during the conduct of this hospital encounter and >50% of this time involved direct face-to-face encounter with the patient for counseling and/or coordination of their care.   Rexene Alberts 08/31/2015 8:59 AM

## 2015-08-31 NOTE — Progress Notes (Signed)
    Subjective:  No CP or dyspnea. Feels ok this morning. She has been pacemaker dependent overnight, rate currently set at 60 bpm.  Objective:  Vital Signs in the last 24 hours: Temp:  [96.1 F (35.6 C)-98.3 F (36.8 C)] 98.3 F (36.8 C) (09/28 0359) Pulse Rate:  [42-59] 59 (09/28 0700) Resp:  [11-21] 19 (09/28 0700) BP: (106-134)/(29-81) 131/41 mmHg (09/28 0700) SpO2:  [96 %-100 %] 97 % (09/28 0700) Arterial Line BP: (129-162)/(38-55) 162/44 mmHg (09/27 1700) Weight:  [188 lb 7.9 oz (85.5 kg)-190 lb (86.183 kg)] 188 lb 7.9 oz (85.5 kg) (09/28 0600)  Intake/Output from previous day: 09/27 0701 - 09/28 0700 In: 2847.1 [P.O.:240; I.V.:2057.1; IV Piggyback:550] Out: 3383 [Urine:1740]  Physical Exam: Pt is alert and oriented, NAD HEENT: normal Neck: JVP - normal Lungs: CTA bilaterally CV: RRR with 2/6 early peaking SEM at the RUSB Abd: soft, NT, Positive BS, no hepatomegaly Ext: no C/C/E, distal pulses intact and equal Skin: right groin incision clear, left groin site dressed and clear   Lab Results:  Recent Labs  08/30/15 1950 08/31/15 0420  WBC 9.4 8.3  HGB 10.1* 9.9*  PLT 154 160    Recent Labs  08/30/15 1947 08/30/15 1950 08/31/15 0420  NA 141  --  135  K 4.3  --  3.9  CL 103  --  102  CO2  --   --  28  GLUCOSE 115*  --  100*  BUN 15  --  13  CREATININE 0.70 0.70 0.70   No results for input(s): TROPONINI in the last 72 hours.  Invalid input(s): CK, MB  Cardiac Studies: 2D Echo pending  Tele: Complete AV block, ventricular pacing  Assessment/Plan:  1. Severe aortic stenosis s/p TAVR POD #1 2. Complete AV block: pacemaker rate turned down to 35 bpm and she is still dependent with underlying complete AV block. She's received no beta-blocker. Will ask EP to see for consideration of PPM. Discussed with patient. Keep NPO. 3. Diabetes, Type 2: CBG 83-119, not requiring insulin at present 4. Anemia, post-op, mild - expected  Dispo: keep in 2S while  temp pacer dependent, EP to see today.  Sherren Mocha, M.D. 08/31/2015, 7:08 AM

## 2015-08-31 NOTE — Progress Notes (Signed)
  Echocardiogram 2D Echocardiogram has been performed.  Catherine [name] 08/31/2015, 4:57 PM

## 2015-09-01 ENCOUNTER — Inpatient Hospital Stay (HOSPITAL_COMMUNITY): Payer: Medicare Other

## 2015-09-01 ENCOUNTER — Encounter (HOSPITAL_COMMUNITY): Payer: Self-pay | Admitting: Cardiology

## 2015-09-01 DIAGNOSIS — I442 Atrioventricular block, complete: Secondary | ICD-10-CM

## 2015-09-01 DIAGNOSIS — I350 Nonrheumatic aortic (valve) stenosis: Secondary | ICD-10-CM

## 2015-09-01 LAB — URINALYSIS, ROUTINE W REFLEX MICROSCOPIC
Bilirubin Urine: NEGATIVE
GLUCOSE, UA: NEGATIVE mg/dL
Hgb urine dipstick: NEGATIVE
KETONES UR: NEGATIVE mg/dL
LEUKOCYTES UA: NEGATIVE
Nitrite: NEGATIVE
PH: 5.5 (ref 5.0–8.0)
Protein, ur: NEGATIVE mg/dL
Specific Gravity, Urine: 1.01 (ref 1.005–1.030)
Urobilinogen, UA: 0.2 mg/dL (ref 0.0–1.0)

## 2015-09-01 NOTE — Progress Notes (Signed)
Foley d/c per MD order. RN instructed pt to call out when she needed to use the bathroom.   Will continue to monitor.   Earlie Lou

## 2015-09-01 NOTE — Progress Notes (Signed)
      [address]       [place] [address]             [phone]     CARDIOTHORACIC SURGERY PROGRESS NOTE  2 Days Post-Op Procedure(s) (LRB): TRANSCATHETER AORTIC VALVE REPLACEMENT, TRANSFEMORAL (N/A) TRANSESOPHAGEAL ECHOCARDIOGRAM (TEE) (N/A)  1 Day Post-Op  S/P Procedure(s) (LRB): Pacemaker Implant (N/A)  Subjective: Tearful this morning.  Mild soreness right chest and arm.  Denies SOB  Objective: Vital signs in last 24 hours: Temp:  [98.8 F (37.1 C)] 98.8 F (37.1 C) (09/29 0550) Pulse Rate:  [0-156] 87 (09/29 0550) Cardiac Rhythm:  [-] Ventricular paced (09/28 1900) Resp:  [0-79] 18 (09/29 0550) BP: (126-178)/(35-54) 158/51 mmHg (09/29 0550) SpO2:  [0 %-100 %] 96 % (09/29 0550) FiO2 (%):  [40 %] 40 % (09/28 1126) Weight:  [90.2 kg (198 lb 13.7 oz)] 90.2 kg (198 lb 13.7 oz) (09/29 0500)  Physical Exam:  Rhythm:   Sinus w/ V-pacing  Breath sounds: clear  Heart sounds:  RRR  Incisions:  Clean and dry  Abdomen:  Soft, non-distended, non-tender  Extremities:  Warm, well-perfused    Intake/Output from previous day: 09/28 0701 - 09/29 0700 In: 180.8 [P.O.:120; I.V.:60.8] Out: 1720 [Urine:1720] Intake/Output this shift:    Lab Results:  Recent Labs  08/30/15 1950 08/31/15 0420  WBC 9.4 8.3  HGB 10.1* 9.9*  HCT 32.4* 31.6*  PLT 154 160   BMET:  Recent Labs  08/30/15 1947 08/30/15 1950 08/31/15 0420  NA 141  --  135  K 4.3  --  3.9  CL 103  --  102  CO2  --   --  28  GLUCOSE 115*  --  100*  BUN 15  --  13  CREATININE 0.70 0.70 0.70  CALCIUM  --   --  8.4*    CBG (last 3)   Recent Labs  08/30/15 0916 08/31/15 1636  GLUCAP 112* 159*   PT/INR:   Recent Labs  08/30/15 1430  LABPROT 16.3*  INR 1.29    CXR:  CHEST 2 VIEW  COMPARISON: Portable chest x-ray dated August 31, 2015  FINDINGS: There is a small right apical pneumothorax amounting. On the left the lung is well-expanded. There are small bilateral  pleural effusions. The cardiac silhouette is normal. The pulmonary vascularity is normal. The permanent pacemaker is in reasonable position. A prosthetic aortic valve cage is visible. The bony thorax is unremarkable. There are degenerative changes of both shoulders.  IMPRESSION: There is a 5-10% right-sided hydropneumothorax new since pacemaker placement. There is a trace left pleural effusion. There is no pulmonary edema. These results were called by telephone at the time of interpretation on 09/01/2015 at 7:18 am to Richarda Blade, RN, who verbally acknowledged these results.   Electronically Signed  By: David Martinique M.D.  On: 09/01/2015 07:20  Assessment/Plan: S/P Procedure(s) (LRB): Pacemaker Implant (N/A)  Overall stable although small iatrogenic right PTX after pacemaker placement Will observe closely and obtain repeat CXR in am tomorrow or sooner if patient develops SOB  I spent in excess of 15 minutes during the conduct of this hospital encounter and >50% of this time involved direct face-to-face encounter with the patient for counseling and/or coordination of their care.   Rexene Alberts 09/01/2015 8:34 AM

## 2015-09-01 NOTE — Progress Notes (Signed)
SUBJECTIVE: The patient is doing well today.  At this time, she denies chest pain, shortness of breath, or any new concerns.  She had some nausea last night.   CURRENT MEDICATIONS: . acetaminophen  1,000 mg Oral 4 times per day  . aspirin EC  81 mg Oral Daily  . atorvastatin  20 mg Oral Q lunch  . cefUROXime (ZINACEF)  IV  1.5 g Intravenous Q12H  . citalopram  20 mg Oral Q lunch  . clonazePAM  1 mg Oral QHS  . clopidogrel  75 mg Oral Q breakfast  . levothyroxine  100 mcg Oral QAC breakfast  . pantoprazole  40 mg Oral Daily  . sodium chloride  3 mL Intravenous Q12H      OBJECTIVE: Physical Exam: Filed Vitals:   08/31/15 1600 08/31/15 2054 09/01/15 0500 09/01/15 0550  BP: 135/36 167/38  158/51  Pulse: 84 88  87  Temp:  98.8 F (37.1 C)  98.8 F (37.1 C)  TempSrc:  Oral  Oral  Resp:  18  18  Height:      Weight:   198 lb 13.7 oz (90.2 kg)   SpO2:    96%    Intake/Output Summary (Last 24 hours) at 09/01/15 0736 Last data filed at 09/01/15 0551  Gross per 24 hour  Intake 180.83 ml  Output   1720 ml  Net -1539.17 ml    Telemetry reveals sinus rhythm with V pacing  GEN- The patient is well appearing, alert and oriented x 3 today.   Head- normocephalic, atraumatic Eyes-  Sclera clear, conjunctiva pink Ears- hearing intact Oropharynx- clear Neck- supple  Lungs- Clear to ausculation bilaterally, normal work of breathing Heart- Regular rate and rhythm (paced) GI- soft, NT, ND, + BS Extremities- no clubbing, cyanosis, or edema Skin- no rash or lesion, right chest without hematoma/ecchymosis Psych- euthymic mood, full affect Neuro- strength and sensation are intact  LABS: Basic Metabolic Panel:  Recent Labs  08/30/15 1947 08/30/15 1950 08/31/15 0420  NA 141  --  135  K 4.3  --  3.9  CL 103  --  102  CO2  --   --  28  GLUCOSE 115*  --  100*  BUN 15  --  13  CREATININE 0.70 0.70 0.70  CALCIUM  --   --  8.4*  MG  --  2.0 1.9   CBC:  Recent Labs  08/30/15 1950 08/31/15 0420  WBC 9.4 8.3  HGB 10.1* 9.9*  HCT 32.4* 31.6*  MCV 86.2 86.6  PLT 154 160    RADIOLOGY: Dg Chest 2 View 09/01/2015   CLINICAL DATA:  Status post permanent pacemaker placement  EXAM: CHEST  2 VIEW  COMPARISON:  Portable chest x-ray dated August 31, 2015  FINDINGS: There is a small right apical pneumothorax amounting. On the left the lung is well-expanded. There are small bilateral pleural effusions. The cardiac silhouette is normal. The pulmonary vascularity is normal. The permanent pacemaker is in reasonable position. A prosthetic aortic valve cage is visible. The bony thorax is unremarkable. There are degenerative changes of both shoulders.  IMPRESSION: There is a 5-10% right-sided hydropneumothorax new since pacemaker placement. There is a trace left pleural effusion. There is no pulmonary edema. These results were called by telephone at the time of interpretation on 09/01/2015 at 7:18 am to Richarda Blade, RN, who verbally acknowledged these results.   Electronically Signed   By: David  Martinique M.D.   On: 09/01/2015 07:20  ASSESSMENT AND PLAN:  Principal Problem:   S/P TAVR (transcatheter aortic valve replacement) Active Problems:   Aortic stenosis   Aortic valve stenosis, critical   Type II diabetes mellitus   Essential hypertension   Chronic diastolic congestive heart failure   Severe aortic stenosis    1.  Complete heart block S/p PPM implant - pt is pacemaker dependent today 5-10% hydropneumothorax by CXR today - will follow as per TCTS recommendations - her O2 sats are stable and she is not complaining of SOB Device interrogation reviewed and normal  2.  Severe AS s/p TAVR Management per interventional cardiology  3.  HTN Stable No change required today  Chanetta Marshall, NP 09/01/2015 7:39 AM  I have seen and examined this patient with Chanetta Marshall.  Agree with above, note added to reflect my findings.  On exam, RRR, lungs clear.  CXR showed  hydropneumothorax.  Having primary team take a look and determine if drainage needed.  Will get CXR tomorrow to further monitor.    Will M. Camnitz MD 09/01/2015 1:39 PM

## 2015-09-01 NOTE — Progress Notes (Signed)
Subjective:  No chest pain or dyspnea.   Objective:  Vital Signs in the last 24 hours: Temp:  [98.8 F (37.1 C)] 98.8 F (37.1 C) (09/29 0550) Pulse Rate:  [0-156] 87 (09/29 0550) Resp:  [0-79] 18 (09/29 0550) BP: (121-178)/(33-54) 158/51 mmHg (09/29 0550) SpO2:  [0 %-100 %] 96 % (09/29 0550) FiO2 (%):  [40 %] 40 % (09/28 1126) Weight:  [198 lb 13.7 oz (90.2 kg)] 198 lb 13.7 oz (90.2 kg) (09/29 0500)  Intake/Output from previous day: 09/28 0701 - 09/29 0700 In: 180.8 [P.O.:120; I.V.:60.8] Out: 6503 [Urine:1720]  Physical Exam: Pt is alert and oriented, NAD, tearful at times during discussion HEENT: normal Neck: JVP - normal Lungs: CTA bilaterally CV: RRR with soft SEM at the RUSB Abd: soft, NT, Positive BS, no hepatomegaly Ext: no C/C/E, distal pulses intact and equal, groin sites clear Skin: warm/dry no rash   Lab Results:  Recent Labs  08/30/15 1950 08/31/15 0420  WBC 9.4 8.3  HGB 10.1* 9.9*  PLT 154 160    Recent Labs  08/30/15 1947 08/30/15 1950 08/31/15 0420  NA 141  --  135  K 4.3  --  3.9  CL 103  --  102  CO2  --   --  28  GLUCOSE 115*  --  100*  BUN 15  --  13  CREATININE 0.70 0.70 0.70   No results for input(s): TROPONINI in the last 72 hours.  Invalid input(s): CK, MB  Cardiac Studies: 2D Echo POD#1 Study: Study Conclusions  - Left ventricle: The cavity size was normal. Systolic function was vigorous. The estimated ejection fraction was in the range of 65% to 70%. Wall motion was normal; there were no regional wall motion abnormalities. Doppler parameters are consistent with abnormal left ventricular relaxation (grade 1 diastolic dysfunction). Doppler parameters are consistent with elevated mean left atrial filling pressure. - Aortic valve: A stent-valve bioprosthesis was present and functioning normally. There was no regurgitation. Valve area (VTI): 2.26 cm^2. Valve area (Vmax): 2.38 cm^2. Valve area (Vmean):  2.32 cm^2. - Left atrium: The atrium was mildly dilated. - Pulmonary arteries: Systolic pressure was moderately increased. PA peak pressure: 46 mm Hg (S).  ------------------------------------------------------------------- Labs, prior tests, procedures, and surgery: Permanent pacemaker system implantation.  Transthoracic echocardiography. M-mode, complete 2D, spectral Doppler, and color Doppler. Birthdate: Patient birthdate: 04-26-1941. Age: Patient is 74 yr old. Sex: Gender: female. BMI: 33.3 kg/m^2. Blood pressure:   130/36 Patient status: Inpatient. Study date: Study date: 08/31/2015. Study time: 04:14 PM. Location: Bedside.  -------------------------------------------------------------------  ------------------------------------------------------------------- Left ventricle: The cavity size was normal. Systolic function was vigorous. The estimated ejection fraction was in the range of 65% to 70%. Wall motion was normal; there were no regional wall motion abnormalities. Doppler parameters are consistent with abnormal left ventricular relaxation (grade 1 diastolic dysfunction). Doppler parameters are consistent with elevated mean left atrial filling pressure.  ------------------------------------------------------------------- Aortic valve: A stent-valve bioprosthesis was present and functioning normally. Doppler: There was no regurgitation.  VTI ratio of LVOT to aortic valve: 0.72. Valve area (VTI): 2.26 cm^2. Indexed valve area (VTI): 1.14 cm^2/m^2. Peak velocity ratio of LVOT to aortic valve: 0.76. Valve area (Vmax): 2.38 cm^2. Indexed valve area (Vmax): 1.2 cm^2/m^2. Mean velocity ratio of LVOT to aortic valve: 0.74. Valve area (Vmean): 2.32 cm^2. Indexed valve area (Vmean): 1.17 cm^2/m^2.  Mean gradient (S): 12 mm Hg. Peak gradient (S): 23 mm Hg.  ------------------------------------------------------------------- Mitral valve:  Structurally  normal valve.  Leaflet separation was normal. Doppler: Transvalvular velocity was within the normal range. There was no evidence for stenosis. There was trivial regurgitation.  ------------------------------------------------------------------- Left atrium: The atrium was mildly dilated.  ------------------------------------------------------------------- Right ventricle: The cavity size was normal. Wall thickness was normal. Pacer wire or catheter noted in right ventricle. Systolic function was normal.  ------------------------------------------------------------------- Pulmonic valve:  Poorly visualized. The valve appears to be grossly normal.  Doppler: There was trivial regurgitation.  ------------------------------------------------------------------- Tricuspid valve:  Structurally normal valve.  Leaflet separation was normal. Doppler: Transvalvular velocity was within the normal range. There was mild regurgitation.  ------------------------------------------------------------------- Pulmonary artery:  Systolic pressure was moderately increased.  ------------------------------------------------------------------- Right atrium: The atrium was normal in size. Pacer wire or catheter noted in right atrium.  ------------------------------------------------------------------- Pericardium: There was no pericardial effusion.  ------------------------------------------------------------------- Systemic veins: Inferior vena cava: The vessel was normal in size. The respirophasic diameter changes were in the normal range (>= 50%), consistent with normal central venous pressure.  Tele: Paced rhythm  Assessment/Plan:  1. Severe aortic stenosis POD #2 from TAVR 2. Complete heart block s/p PPM 3. Right pneumothorax, small: 10-15% 4. HTN: stable  Pt stable. Echo reviewed and looks good with normal transvalvular gradients and no AI. PPM placed - small pneumo noted.  To repeat CXR in am unless further symptoms arise.   Sherren Mocha, M.D. 09/01/2015, 6:34 AM

## 2015-09-02 ENCOUNTER — Inpatient Hospital Stay (HOSPITAL_COMMUNITY): Payer: Medicare Other

## 2015-09-02 DIAGNOSIS — Z954 Presence of other heart-valve replacement: Secondary | ICD-10-CM

## 2015-09-02 DIAGNOSIS — J9311 Primary spontaneous pneumothorax: Secondary | ICD-10-CM

## 2015-09-02 DIAGNOSIS — I350 Nonrheumatic aortic (valve) stenosis: Secondary | ICD-10-CM

## 2015-09-02 LAB — URINE CULTURE: CULTURE: NO GROWTH

## 2015-09-02 MED ORDER — MAGNESIUM HYDROXIDE 400 MG/5ML PO SUSP
30.0000 mL | Freq: Every day | ORAL | Status: DC | PRN
  Administered 2015-09-05: 30 mL via ORAL
  Filled 2015-09-02: qty 30

## 2015-09-02 NOTE — Progress Notes (Signed)
SUBJECTIVE: Increased shortness of breath this morning.   CURRENT MEDICATIONS: . acetaminophen  1,000 mg Oral 4 times per day  . aspirin EC  81 mg Oral Daily  . atorvastatin  20 mg Oral Q lunch  . citalopram  20 mg Oral Q lunch  . clonazePAM  1 mg Oral QHS  . clopidogrel  75 mg Oral Q breakfast  . levothyroxine  100 mcg Oral QAC breakfast  . pantoprazole  40 mg Oral Daily  . sodium chloride  3 mL Intravenous Q12H      OBJECTIVE: Physical Exam: Filed Vitals:   09/01/15 0550 09/01/15 1455 09/01/15 2027 09/02/15 0529  BP: 158/51 192/57 171/57 166/69  Pulse: 87 80 95 101  Temp: 98.8 F (37.1 C) 98 F (36.7 C) 98.2 F (36.8 C) 97.8 F (36.6 C)  TempSrc: Oral Oral Oral Oral  Resp: 18 18 18 28   Height:      Weight:    193 lb 8 oz (87.771 kg)  SpO2: 96% 97% 98% 96%    Intake/Output Summary (Last 24 hours) at 09/02/15 0948 Last data filed at 09/02/15 0536  Gross per 24 hour  Intake      0 ml  Output   1250 ml  Net  -1250 ml    Telemetry reveals sinus rhythm with V pacing  GEN- The patient is well appearing, alert and oriented x 3 today.   Head- normocephalic, atraumatic Eyes-  Sclera clear, conjunctiva pink Ears- hearing intact Oropharynx- clear Neck- supple  Lungs- Diminished on right side Heart- Regular rate and rhythm (paced) GI- soft, NT, ND, + BS Extremities- no clubbing, cyanosis, or edema Skin- no rash or lesion, right chest without hematoma/ecchymosis Psych- euthymic mood, full affect Neuro- strength and sensation are intact  LABS: Basic Metabolic Panel:  Recent Labs  08/30/15 1947 08/30/15 1950 08/31/15 0420  NA 141  --  135  K 4.3  --  3.9  CL 103  --  102  CO2  --   --  28  GLUCOSE 115*  --  100*  BUN 15  --  13  CREATININE 0.70 0.70 0.70  CALCIUM  --   --  8.4*  MG  --  2.0 1.9   CBC:  Recent Labs  08/30/15 1950 08/31/15 0420  WBC 9.4 8.3  HGB 10.1* 9.9*  HCT 32.4* 31.6*  MCV 86.2 86.6  PLT 154 160    RADIOLOGY: Dg  Chest 2 View 09/01/2015   CLINICAL DATA:  Status post permanent pacemaker placement  EXAM: CHEST  2 VIEW  COMPARISON:  Portable chest x-ray dated August 31, 2015  FINDINGS: There is a small right apical pneumothorax amounting. On the left the lung is well-expanded. There are small bilateral pleural effusions. The cardiac silhouette is normal. The pulmonary vascularity is normal. The permanent pacemaker is in reasonable position. A prosthetic aortic valve cage is visible. The bony thorax is unremarkable. There are degenerative changes of both shoulders.  IMPRESSION: There is a 5-10% right-sided hydropneumothorax new since pacemaker placement. There is a trace left pleural effusion. There is no pulmonary edema. These results were called by telephone at the time of interpretation on 09/01/2015 at 7:18 am to Richarda Blade, RN, who verbally acknowledged these results.   Electronically Signed   By: David  Martinique M.D.   On: 09/01/2015 07:20    ASSESSMENT AND PLAN:  Principal Problem:   S/P TAVR (transcatheter aortic valve replacement) Active Problems:   Aortic stenosis  Aortic valve stenosis, critical   Type II diabetes mellitus   Essential hypertension   Chronic diastolic congestive heart failure   Severe aortic stenosis   Complete heart block    1.  Complete heart block S/p PPM implant  Device interrogation reviewed and normal 09/01/15  2.  PTX Increased by CXR this morning with increased shortness of breath CT placed by Dr Roxy Manns this morning, follow up CXR   3.  Severe AS s/p TAVR Management per interventional cardiology  4.  HTN Stable No change required today  Chanetta Marshall, NP 09/02/2015 9:48 AM   I have seen and examined this patient with Chanetta Marshall.  Agree with above, note added to reflect my findings.  On exam, regular rhythm, no murmurs, no wheezing.  Had pneumothrorax after pacemaker placement.  Chest tube placed today.  Kyley Solow follow up X-ray for improvement in PTX.  Taegen Delker M.  Jalon Blackwelder MD 09/02/2015 2:59 PM

## 2015-09-02 NOTE — Progress Notes (Signed)
Radiology called RN to inform her of pt CXR has hydropneumothorax increased to 30-40% from 10%; PA [facility] notified; will closely monitor pt. Francis Gaines Kuffour RN.

## 2015-09-02 NOTE — Progress Notes (Signed)
RN notified Meng,PA concerning this am when patient ambulating to bsc increasing SOB. O2 sats WNL. New xray uploaded.   Will continue to monitor.   Earlie Lou

## 2015-09-02 NOTE — Progress Notes (Addendum)
Patient with PMH of HTN and severe AS underwent TAVR followed by complete heart block requiring PPM placement during this admission. CXR yesterday noted 5-10% hydropneumothorax, CXR this morning done but not read yet, pending radiology comment.  Paged by nurse for DOE, patient seen, denies any worsening of SOB compare to yesterday, still about the same. Lung no rale, moving air. No suspicion for CHF. LE no edema. Heart S1S2, no significant murmur to suspect anything wrong with TAVR. Patient on room air satting 95%. Will continue to monitor and followup on CXR result.  Hilbert Corrigan PA Pager: [phone]  Addendum 8:00am: CXR result came back, pneumothorax increase to 30-40% of lung volume, will consult pulmonary critical care.  Hilbert Corrigan PA Pager: [phone]  Addendum: discussed with Dr. Burt Knack, no need for pulmonary critical consult, CT surgery following.  Hilbert Corrigan PA Pager: [phone]

## 2015-09-02 NOTE — Progress Notes (Signed)
      [address]       [place] [address]             [phone]     CARDIOTHORACIC SURGERY PROGRESS NOTE  3 Days Post-Op Procedure(s) (LRB): TRANSCATHETER AORTIC VALVE REPLACEMENT, TRANSFEMORAL (N/A) TRANSESOPHAGEAL ECHOCARDIOGRAM (TEE) (N/A)  2 Days Post-Op  S/P Procedure(s) (LRB): Pacemaker Implant (N/A)  Subjective: Feels more short of breath.  Sore in right shoulder  Objective: Vital signs in last 24 hours: Temp:  [97.8 F (36.6 C)-98.2 F (36.8 C)] 97.8 F (36.6 C) (09/30 0529) Pulse Rate:  [80-101] 101 (09/30 0529) Cardiac Rhythm:  [-] Ventricular paced (09/30 0700) Resp:  [18-28] 28 (09/30 0529) BP: (166-192)/(57-69) 166/69 mmHg (09/30 0529) SpO2:  [96 %-98 %] 96 % (09/30 0529) Weight:  [87.771 kg (193 lb 8 oz)] 87.771 kg (193 lb 8 oz) (09/30 0529)  Physical Exam:  Rhythm:   Sinus w/ V-pacing  Breath sounds: Diminished on right side  Heart sounds:  RRR  Incisions:  Clean and dry  Abdomen:  Soft, non-distended, non-tender  Extremities:  Warm, well-perfused    Intake/Output from previous day: 09/29 0701 - 09/30 0700 In: 240 [P.O.:240] Out: 1250 [Urine:1250] Intake/Output this shift:    Lab Results:  Recent Labs  08/30/15 1950 08/31/15 0420  WBC 9.4 8.3  HGB 10.1* 9.9*  HCT 32.4* 31.6*  PLT 154 160   BMET:  Recent Labs  08/30/15 1947 08/30/15 1950 08/31/15 0420  NA 141  --  135  K 4.3  --  3.9  CL 103  --  102  CO2  --   --  28  GLUCOSE 115*  --  100*  BUN 15  --  13  CREATININE 0.70 0.70 0.70  CALCIUM  --   --  8.4*    CBG (last 3)   Recent Labs  08/30/15 0916 08/31/15 1636  GLUCAP 112* 159*   PT/INR:   Recent Labs  08/30/15 1430  LABPROT 16.3*  INR 1.29    CXR:  CHEST 2 VIEW  COMPARISON: PA and lateral chest x-ray of September 01, 2015  FINDINGS: Since a previous study the right-sided pneumothorax has increased in volume and amounts to approximately 30-40% of the lung volume. There is no  mediastinal shift. The left lung is clear. The heart and pulmonary vascularity are normal. There is a right perihilar atelectasis. The permanent pacemaker is in reasonable position. The aortic valve cage remains visible. The bony thorax exhibits no acute abnormality.  IMPRESSION: Increasing right-sided pneumothorax now amounting to approximately 30% to 40% of the lung volume.  These results were called by telephone at the time of interpretation on 09/02/2015 at 7:52 am to [facility], RN, who verbally acknowledged these results.   Electronically Signed  By: David Martinique M.D.  On: 09/02/2015 07:53  Assessment/Plan:  Iatrogenic right pneumothorax has enlarged and patient has become more short of breath.  She needs chest tube placement.  I have discussed the nature of this problem with the patient and her daughter at the bedside.  All questions answered.  I spent in excess of 15 minutes during the conduct of this hospital encounter and >50% of this time involved direct face-to-face encounter with the patient for counseling and/or coordination of their care.   Rexene Alberts 09/02/2015 8:32 AM

## 2015-09-02 NOTE — Care Management Note (Signed)
Case Management Note  Patient Details  Name: STEPHANIEANN POPESCU MRN: [id number] Date of Birth: 24-Feb-1941  Subjective/Objective:   Pt admitted is s/p TAVR, pacemaker placement that developed a right pneumothorax subsequently had chest tube inserted 09/02/15.              Action/Plan:  Pt is independent from home with spouse, uses cane at home with ambulation.  Pt also has walker available at home.  Pt may benefit from [facility] post discharge, CM will continue to monitor for disposition needs   Expected Discharge Date:                  Expected Discharge Plan:  [place] (Pt is from home with husband, uses cane for ambulation at home, daughter provides additonal support when needed)  In-House Referral:     Discharge planning Services  CM Consult  Post Acute Care Choice:    Choice offered to:     DME Arranged:    DME Agency:     HH Arranged:    [place] Agency:     Status of Service:  In process, will continue to follow  Medicare Important Message Given:    Date Medicare IM Given:    Medicare IM give by:    Date Additional Medicare IM Given:    Additional Medicare Important Message give by:     If discussed at [place] of Stay Meetings, dates discussed:    Additional Comments: CM assessed pt, no CM needs at this time Maryclare Labrador, RN 09/02/2015, 12:08 PM

## 2015-09-02 NOTE — Care Management Important Message (Signed)
Important Message  Patient Details  Name: Catherine [name] MRN: [id number] Date of Birth: [birth year]   Medicare Important Message Given:  Yes-second notification given    [name] May 09/02/2015, 12:32 PM

## 2015-09-02 NOTE — Progress Notes (Signed)
Central Telemetry informed me of a 12 beat run of VTach at 2242, pt was evaluated, heart rhythm was regular, pt was asymptomatic, will continue to monitor pt, pt resting in bed with call bell in reach.  Idolina Primer, Lanetta Inch

## 2015-09-02 NOTE — Progress Notes (Signed)
UR Completed. Nolon Yellin, RN, BSN.  [phone] 

## 2015-09-02 NOTE — Progress Notes (Signed)
RN observed pt ambulate from bed to bsc and pt became SOB. o2 sats at 97%. After resting a short time, pt breathing returned to normal.   Will continue to monitor.   Earlie Lou

## 2015-09-02 NOTE — Op Note (Signed)
CARDIOTHORACIC SURGERY OPERATIVE NOTE  Date of Procedure:  09/02/2015  Preoperative Diagnosis: Right Pneumothorax  Postoperative Diagnosis: Same  Procedure:   Right chest tube placement  Surgeon:   Valentina Gu. Roxy Manns, MD  Anesthesia:   1% lidocaine local    DETAILS OF THE OPERATIVE PROCEDURE  Following full informed consent the patient was placed in the supine position in her hospital bed and continuously monitored for rhythm, BP and oxygen saturation. The right anterior chest was prepared and draped in a sterile manner. 1% lidocaine was utilized to anesthetize the skin and subcutaneous tissues. A small incision was made and a [place] pig-tail chest tube was placed through the incision into the pleural space using Seldinger technique. The tube was secured to the skin and connected to a closed suction collection device. The patient tolerated the procedure well. A portable CXR was ordered. There were no complications.    Valentina Gu. Roxy Manns, MD 09/02/2015 9:26 AM

## 2015-09-02 NOTE — Progress Notes (Signed)
Pt seen this am. Appreciate the care of Dr Shonna Chock and Dr Roxy Manns. Chest tube placed this am for iatrogenic right pneumothorax.   Pt's daughter at bedside. All questions answered. Hopefully lung will re-expand over the next 48 hours and we will be able to discharge the patient home early next week. Medications reviewed and will be continued. Tele reviewed and shows ventricular pacing.   Sherren Mocha 09/02/2015 3:36 PM

## 2015-09-02 NOTE — Progress Notes (Signed)
Notified Dr. Roxy Manns of small air leak in chest tube and confirmed chest tube to be set on low continuous suction. Francis Gaines Haidyn Kilburg RN.

## 2015-09-03 ENCOUNTER — Inpatient Hospital Stay (HOSPITAL_COMMUNITY): Payer: Medicare Other

## 2015-09-03 DIAGNOSIS — J939 Pneumothorax, unspecified: Secondary | ICD-10-CM

## 2015-09-03 LAB — GLUCOSE, CAPILLARY: Glucose-Capillary: 123 mg/dL — ABNORMAL HIGH (ref 65–99)

## 2015-09-03 MED ORDER — BISACODYL 10 MG RE SUPP
10.0000 mg | Freq: Once | RECTAL | Status: AC
  Administered 2015-09-03: 10 mg via RECTAL
  Filled 2015-09-03: qty 1

## 2015-09-03 MED ORDER — FLEET ENEMA 7-19 GM/118ML RE ENEM
1.0000 | ENEMA | Freq: Once | RECTAL | Status: AC
  Administered 2015-09-03: 1 via RECTAL
  Filled 2015-09-03: qty 1

## 2015-09-03 NOTE — Progress Notes (Addendum)
      [address]       [place] [address]             [phone]      3 Days Post-Op Procedure(s) (LRB): Pacemaker Implant (N/A)   Subjective:  Catherine [name] states she has pain from her chest tube.  She also complains of constipation  Objective: Vital signs in last 24 hours: Temp:  [98.2 F (36.8 C)-98.6 F (37 C)] 98.6 F (37 C) (10/01 0443) Pulse Rate:  [81-102] 102 (10/01 0443) Cardiac Rhythm:  [-] Ventricular paced (10/01 0700) Resp:  [18-22] 18 (10/01 0443) BP: (153-198)/(54-73) 153/54 mmHg (10/01 0443) SpO2:  [97 %-98 %] 97 % (10/01 0443) Weight:  [190 lb 11.2 oz (86.5 kg)] 190 lb 11.2 oz (86.5 kg) (10/01 0443)  Intake/Output from previous day: 09/30 0701 - 10/01 0700 In: -  Out: 511 [Urine:475; Chest Tube:36]  General appearance: alert, cooperative and no distress Heart: regular rate and rhythm, paced Lungs: clear to auscultation bilaterally Abdomen: soft, non-tender; bowel sounds normal; no masses,  no organomegaly Wound: clean and dry  Lab Results: No results for input(s): WBC, HGB, HCT, PLT in the last 72 hours. BMET: No results for input(s): NA, K, CL, CO2, GLUCOSE, BUN, CREATININE, CALCIUM in the last 72 hours.  PT/INR: No results for input(s): LABPROT, INR in the last 72 hours. ABG    Component Value Date/Time   PHART 7.356 08/30/2015 1434   HCO3 26.5* 08/30/2015 1434   TCO2 27 08/30/2015 1947   O2SAT 99.0 08/30/2015 1434   CBG (last 3)   Recent Labs  08/31/15 1636 09/02/15 2102  GLUCAP 159* 123*    Assessment/Plan: S/P Procedure(s) (LRB): Pacemaker Implant (N/A)  1. CV- CHB, PPM in place, S/P TAVR 2. Pulm- resolution of pneumothorax, Chest tube free from air leak, will leave on suction today,if remains stable will place to water seal tomorrow 3. GI- constipation, request Dulcolax suppository 4. Dispo- patient stable, no pneumothorax on CXR, leave on suction today, repeat CXR in AM   LOS: 4 days    BARRETT,  ERIN 09/03/2015  I have seen and examined the patient and agree with the assessment and plan as outlined.  No air leak and PTX resolved.  Will plan to d/c tube tomorrow if no sign of air leak or PTX.  Patient feels constipated and wants enema  I spent in excess of 15 minutes during the conduct of this hospital encounter and >50% of this time involved direct face-to-face encounter with the patient for counseling and/or coordination of their care.   Rexene Alberts, MD 09/03/2015 11:40 AM

## 2015-09-03 NOTE — Progress Notes (Signed)
SUBJECTIVE: Breathing improved this AM.  Continuing to pace without issues.  Chest tube in place but discomfort in back due to tube.   CURRENT MEDICATIONS: . acetaminophen  1,000 mg Oral 4 times per day  . aspirin EC  81 mg Oral Daily  . atorvastatin  20 mg Oral Q lunch  . citalopram  20 mg Oral Q lunch  . clonazePAM  1 mg Oral QHS  . clopidogrel  75 mg Oral Q breakfast  . levothyroxine  100 mcg Oral QAC breakfast  . pantoprazole  40 mg Oral Daily  . sodium chloride  3 mL Intravenous Q12H      OBJECTIVE: Physical Exam: Filed Vitals:   09/02/15 2042 09/02/15 2138 09/02/15 2306 09/03/15 0443  BP: 170/70 162/67 198/62 153/54  Pulse:  96 91 102  Temp:    98.6 F (37 C)  TempSrc:    Oral  Resp:    18  Height:      Weight:    190 lb 11.2 oz (86.5 kg)  SpO2: 98%   97%    Intake/Output Summary (Last 24 hours) at 09/03/15 0708 Last data filed at 09/03/15 6301  Gross per 24 hour  Intake      0 ml  Output    511 ml  Net   -511 ml    Telemetry reveals sinus rhythm with V pacing  GEN- The patient is well appearing, alert and oriented x 3 today.   Head- normocephalic, atraumatic Eyes-  Sclera clear, conjunctiva pink Ears- hearing intact Oropharynx- clear Neck- supple  Lungs- Diminished on right side, improved from previous Heart- Regular rate and rhythm (paced) GI- soft, NT, ND, + BS Extremities- no clubbing, cyanosis, or edema Skin- no rash or lesion, right chest without hematoma/ecchymosis Psych- euthymic mood, full affect Neuro- strength and sensation are intact  LABS: Basic Metabolic Panel: No results for input(s): NA, K, CL, CO2, GLUCOSE, BUN, CREATININE, CALCIUM, MG, PHOS in the last 72 hours. CBC: No results for input(s): WBC, NEUTROABS, HGB, HCT, MCV, PLT in the last 72 hours.  RADIOLOGY: COMPARISON CXR: 09/02/2015  FINDINGS: Pigtail catheter in the right pleural space with a persistent small right apical pneumothorax which is decreased in size  compared with earlier film performed same day. There is a dual lead right-sided cardiac pacemaker. There is a linear band of airspace disease in the right perihilar region consistent with atelectasis. There is no other focal parenchymal opacity. There is no pleural effusion or pneumothorax. The heart and mediastinal contours are unremarkable.  The osseous structures are unremarkable.  IMPRESSION: Interval placement of a right-sided chest tube with a small persistent right apical pneumothorax which has decreased in size compared with the earlier x-ray.   ASSESSMENT AND PLAN:  Principal Problem:   S/P TAVR (transcatheter aortic valve replacement) Active Problems:   Aortic stenosis   Aortic valve stenosis, critical   Type II diabetes mellitus   Essential hypertension   Chronic diastolic congestive heart failure   Severe aortic stenosis   Complete heart block    1.  Complete heart block S/p PPM implant  Device interrogation reviewed and normal 09/01/15  2.  PTX Increased by CXR this morning with increased shortness of breath CT placed by Dr Roxy Manns this yesterday, continuing to follow, Ravinder Lukehart pull per surgery.  Keiyana Stehr order CXR today to monitor PTX.  Pain improved with medications given.  3.  Severe AS s/p TAVR Management per interventional cardiology  4.  HTN Stable No  change required today  Jackline Castilla Curt Bears, MD  09/03/2015 7:08 AM

## 2015-09-04 ENCOUNTER — Inpatient Hospital Stay (HOSPITAL_COMMUNITY): Payer: Medicare Other

## 2015-09-04 DIAGNOSIS — I350 Nonrheumatic aortic (valve) stenosis: Secondary | ICD-10-CM

## 2015-09-04 MED ORDER — LISINOPRIL 10 MG PO TABS
10.0000 mg | ORAL_TABLET | Freq: Every day | ORAL | Status: DC
  Administered 2015-09-04 – 2015-09-07 (×4): 10 mg via ORAL
  Filled 2015-09-04 (×4): qty 1

## 2015-09-04 NOTE — Progress Notes (Signed)
09/04/2015 1200 Pt ambulated 200 ft on RA with wheelchair in front of her.  Pt tolerated well. Carney Corners

## 2015-09-04 NOTE — Progress Notes (Addendum)
      [address]       [place] [address]             [phone]      4 Days Post-Op Procedure(s) (LRB): Pacemaker Implant (N/A)   Subjective:  Patient states she feels "hot" this morning.  She states she feels better than yesterday since she was able to move her bowels.   Objective: Vital signs in last 24 hours: Temp:  [98.1 F (36.7 C)-98.4 F (36.9 C)] 98.4 F (36.9 C) (10/02 0444) Pulse Rate:  [91-107] 107 (10/02 0444) Cardiac Rhythm:  [-] Ventricular paced (10/01 2024) Resp:  [18-118] 118 (10/02 0444) BP: (105-180)/(46-83) 180/83 mmHg (10/02 0444) SpO2:  [96 %-98 %] 98 % (10/02 0444) Weight:  [192 lb 3.2 oz (87.181 kg)] 192 lb 3.2 oz (87.181 kg) (10/02 0444)  Intake/Output from previous day: 10/01 0701 - 10/02 0700 In: 483 [P.O.:480; I.V.:3] Out: 364 [Urine:350; Chest Tube:14]  General appearance: alert, cooperative and no distress Heart: regular rate and rhythm and paced Lungs: clear to auscultation bilaterally Abdomen: soft, non-tender; bowel sounds normal; no masses,  no organomegaly Wound: clean and dry  Lab Results: No results for input(s): WBC, HGB, HCT, PLT in the last 72 hours. BMET: No results for input(s): NA, K, CL, CO2, GLUCOSE, BUN, CREATININE, CALCIUM in the last 72 hours.  PT/INR: No results for input(s): LABPROT, INR in the last 72 hours. ABG    Component Value Date/Time   PHART 7.356 08/30/2015 1434   HCO3 26.5* 08/30/2015 1434   TCO2 27 08/30/2015 1947   O2SAT 99.0 08/30/2015 1434   CBG (last 3)   Recent Labs  09/02/15 2102  GLUCAP 123*    Assessment/Plan: S/P Procedure(s) (LRB): Pacemaker Implant (N/A)  1. CV- CHB S/P PPM, S/P TAVR 2. Pulm- + 2 air leak with cough, waiting for CXR to be taken, continue IS 3. GI- constipation resolved 4. Dispo- patient stable, chest tube + air leak with cough, will review CXR once complete, however with air leak chest tube will like remain in place today   LOS: 5 days     BARRETT, ERIN 09/04/2015  I have seen and examined the patient and agree with the assessment and plan as outlined.  Unfortunately, air leak persists and PTX recurred with tube on water seal.  Replace tube to suction.  I spent in excess of 15 minutes during the conduct of this hospital encounter and >50% of this time involved direct face-to-face encounter with the patient for counseling and/or coordination of their care.   Rexene Alberts, MD 09/04/2015 11:54 AM

## 2015-09-04 NOTE — Progress Notes (Addendum)
SUBJECTIVE: Breathing improved this AM.  Continuing to pace without issues.  Chest tube in place but discomfort in back due to tube.  Air leak so waiting for CXR today.  CURRENT MEDICATIONS: . acetaminophen  1,000 mg Oral 4 times per day  . aspirin EC  81 mg Oral Daily  . atorvastatin  20 mg Oral Q lunch  . citalopram  20 mg Oral Q lunch  . clonazePAM  1 mg Oral QHS  . clopidogrel  75 mg Oral Q breakfast  . levothyroxine  100 mcg Oral QAC breakfast  . pantoprazole  40 mg Oral Daily  . sodium chloride  3 mL Intravenous Q12H      OBJECTIVE: Physical Exam: Filed Vitals:   09/03/15 0443 09/03/15 1348 09/03/15 2024 09/04/15 0444  BP: 153/54 105/57 116/46 180/83  Pulse: 102 96 91 107  Temp: 98.6 F (37 C) 98.1 F (36.7 C) 98.4 F (36.9 C) 98.4 F (36.9 C)  TempSrc: Oral Oral Oral Oral  Resp: 18 19 18  118  Height:      Weight: 190 lb 11.2 oz (86.5 kg)   192 lb 3.2 oz (87.181 kg)  SpO2: 97% 96% 97% 98%    Intake/Output Summary (Last 24 hours) at 09/04/15 0907 Last data filed at 09/04/15 0748  Gross per 24 hour  Intake    483 ml  Output    614 ml  Net   -131 ml    Telemetry reveals sinus rhythm with V pacing  GEN- The patient is well appearing, alert and oriented x 3 today.   Head- normocephalic, atraumatic Eyes-  Sclera clear, conjunctiva pink Ears- hearing intact Oropharynx- clear Neck- supple  Lungs- Diminished on right side, improved from previous Heart- Regular rate and rhythm (paced) GI- soft, NT, ND, + BS Extremities- no clubbing, cyanosis, or edema Skin- no rash or lesion, right chest without hematoma/ecchymosis Psych- euthymic mood, full affect Neuro- strength and sensation are intact  LABS: Basic Metabolic Panel: No results for input(s): NA, K, CL, CO2, GLUCOSE, BUN, CREATININE, CALCIUM, MG, PHOS in the last 72 hours. CBC: No results for input(s): WBC, NEUTROABS, HGB, HCT, MCV, PLT in the last 72 hours.  RADIOLOGY: COMPARISON CXR:  09/02/2015  FINDINGS: Pigtail catheter in the right pleural space with a persistent small right apical pneumothorax which is decreased in size compared with earlier film performed same day. There is a dual lead right-sided cardiac pacemaker. There is a linear band of airspace disease in the right perihilar region consistent with atelectasis. There is no other focal parenchymal opacity. There is no pleural effusion or pneumothorax. The heart and mediastinal contours are unremarkable.  The osseous structures are unremarkable.  IMPRESSION: Interval placement of a right-sided chest tube with a small persistent right apical pneumothorax which has decreased in size compared with the earlier x-ray.   ASSESSMENT AND PLAN:  Principal Problem:   S/P TAVR (transcatheter aortic valve replacement) Active Problems:   Aortic stenosis   Aortic valve stenosis, critical   Type II diabetes mellitus (HCC)   Essential hypertension   Chronic diastolic congestive heart failure (HCC)   Severe aortic stenosis   Complete heart block ([place])    1.  Complete heart block S/p PPM implant  Device interrogation reviewed and normal 09/01/15  2.  PTX Increased by CXR this morning with increased shortness of breath CT placed by Dr Roxy Manns this yesterday, continuing to follow, Oumou Smead pull per surgery.  Continued air leak, CXR ordered.  Followed by  surgery.  3.  Severe AS s/p TAVR Management per interventional cardiology  4.  HTN Elevated today, Zebulon Gantt start lisinopril at 10 and increase as tolerated.  Dayanara Sherrill Curt Bears, MD  09/04/2015 9:07 AM

## 2015-09-05 ENCOUNTER — Encounter (HOSPITAL_COMMUNITY): Payer: Self-pay | Admitting: Physician Assistant

## 2015-09-05 ENCOUNTER — Inpatient Hospital Stay (HOSPITAL_COMMUNITY): Payer: Medicare Other

## 2015-09-05 DIAGNOSIS — I350 Nonrheumatic aortic (valve) stenosis: Secondary | ICD-10-CM

## 2015-09-05 DIAGNOSIS — J939 Pneumothorax, unspecified: Secondary | ICD-10-CM | POA: Diagnosis not present

## 2015-09-05 LAB — TYPE AND SCREEN
ABO/RH(D): A POS
Antibody Screen: NEGATIVE
UNIT DIVISION: 0
Unit division: 0

## 2015-09-05 LAB — GLUCOSE, CAPILLARY
GLUCOSE-CAPILLARY: 111 mg/dL — AB (ref 65–99)
Glucose-Capillary: 149 mg/dL — ABNORMAL HIGH (ref 65–99)
Glucose-Capillary: 136 mg/dL — ABNORMAL HIGH (ref 65–99)

## 2015-09-05 MED ORDER — INSULIN ASPART 100 UNIT/ML ~~LOC~~ SOLN
0.0000 [IU] | Freq: Three times a day (TID) | SUBCUTANEOUS | Status: DC
  Administered 2015-09-05: 2 [IU] via SUBCUTANEOUS

## 2015-09-05 MED ORDER — LINAGLIPTIN 5 MG PO TABS
2.5000 mg | ORAL_TABLET | Freq: Every day | ORAL | Status: DC
  Administered 2015-09-05 – 2015-09-06 (×2): 2.5 mg via ORAL
  Filled 2015-09-05 (×2): qty 1

## 2015-09-05 MED ORDER — METFORMIN HCL 500 MG PO TABS
500.0000 mg | ORAL_TABLET | Freq: Every day | ORAL | Status: DC
  Administered 2015-09-05 – 2015-09-06 (×2): 500 mg via ORAL
  Filled 2015-09-05 (×2): qty 1

## 2015-09-05 MED ORDER — LINAGLIPTIN-METFORMIN HCL 2.5-500 MG PO TABS: 1.0000 | ORAL_TABLET | Freq: Every day | ORAL | Status: DC

## 2015-09-05 NOTE — Progress Notes (Signed)
Patient Name: Catherine [name] Date of Encounter: 09/05/2015  Principal Problem:   S/P TAVR (transcatheter aortic valve replacement) Active Problems:   Aortic valve stenosis, critical   Type II diabetes mellitus ([place])   Essential hypertension   Chronic diastolic congestive heart failure ([place])   Complete heart block ([place])   Pneumothorax   Primary Cardiologist: Dr Burt Knack Electrophysiologist: Dr Curt Bears  Patient Profile: 74 yo female w/ severe AS, RA, DM, hypothyroid, HTN, HL, IBS, anemia, admitted 09/27 for TAVR, had CHB>>PPM after procedure, then had PTX>>chest tube by TCTS.   SUBJECTIVE: Still with some pain from the insertion site but tolerating it well. Breathing a little better.  OBJECTIVE Filed Vitals:   09/04/15 1500 09/04/15 1927 09/05/15 0500 09/05/15 0542  BP: 123/48 123/49  146/50  Pulse: 89 93  93  Temp: 98.2 F (36.8 C) 98.7 F (37.1 C)  98.2 F (36.8 C)  TempSrc: Oral Oral  Oral  Resp: 19 18  16   Height:      Weight:   194 lb 0.1 oz (88 kg)   SpO2: 98% 100%  98%    Intake/Output Summary (Last 24 hours) at 09/05/15 0828 Last data filed at 09/05/15 0725  Gross per 24 hour  Intake    123 ml  Output    664 ml  Net   -541 ml   Filed Weights   09/03/15 0443 09/04/15 0444 09/05/15 0500  Weight: 190 lb 11.2 oz (86.5 kg) 192 lb 3.2 oz (87.181 kg) 194 lb 0.1 oz (88 kg)    PHYSICAL EXAM General: Well developed, well nourished, female in no acute distress. Head: Normocephalic, atraumatic.  Neck: Supple without bruits, JVD 9 cm. Lungs:  Resp regular and unlabored, decreased BS bases, R>L. Heart: RRR, S1, S2, no S3, S4, or murmur; no rub. Abdomen: Soft, non-tender, non-distended, BS + x 4.  Extremities: No clubbing, cyanosis, edema.  Neuro: Alert and oriented X 3. Moves all extremities spontaneously. Psych: Normal affect.  LABS: none in > 72 hours  TELE:  SR, V pacing      Radiology/Studies: Dg Chest Port 1 View 09/05/2015   CLINICAL DATA:   Pneumothorax, right.  Chest tubes.  EXAM: PORTABLE CHEST 1 VIEW  COMPARISON:  09/04/2015.  FINDINGS: Trachea is midline. Heart is enlarged. Aortic valve replacement. Right subclavian pacemaker lead tips project over the right atrium and right ventricle.  Trace residual right apical pneumothorax with pigtail catheter terminating in the lateral right hemi thorax. Linear right perihilar and mild bibasilar atelectasis, left greater than right, with a small left pleural effusion. Aeration in the right lung base has improved in the interval.  IMPRESSION: 1. Trace residual right apical pneumothorax with right chest tube in place. 2. Improving right basilar atelectasis. 3. Linear right perihilar atelectasis and mild left basilar atelectasis, stable. 4. Small left pleural effusion, stable.   Electronically Signed   By: Lorin Picket M.D.   On: 09/05/2015 08:00   Dg Chest Port 1 View  09/04/2015   CLINICAL DATA:  Aortic valve replacement and pacemaker insertion. Follow-up pneumothorax.  EXAM: PORTABLE CHEST 1 VIEW  COMPARISON:  09/03/2015  FINDINGS: Cardiomegaly and aortic valve replacement noted.  A right sided pacemaker and right thoracostomy tube again noted and unchanged.  A 10-15% right apical pneumothorax is now identified.  Bibasilar atelectasis and probable trace effusions noted.  No other changes identified.  IMPRESSION: Small right apical pneumothorax now identified, 10-15%, without other interval change. Right thoracostomy tube again  identified.  Mild bibasilar atelectasis and possible trace effusions.  Cardiomegaly and aortic valve replacement.  Results were called by telephone at the time of interpretation on 09/04/2015 at 9:27 am to [facility] [place], who verbally acknowledged these results.   Electronically Signed   By: Margarette Canada M.D.   On: 09/04/2015 09:29     Current Medications:  . aspirin EC  81 mg Oral Daily  . atorvastatin  20 mg Oral Q lunch  . citalopram  20 mg Oral Q lunch  . clonazePAM  1 mg  Oral QHS  . clopidogrel  75 mg Oral Q breakfast  . levothyroxine  100 mcg Oral QAC breakfast  . lisinopril  10 mg Oral Daily  . pantoprazole  40 mg Oral Daily  . sodium chloride  3 mL Intravenous Q12H      ASSESSMENT AND PLAN: Principal Problem:   S/P TAVR (transcatheter aortic valve replacement) - improving  - OK to increase activity as tolerated  Active Problems:   Aortic valve stenosis, critical - see above    Type II diabetes mellitus ([place]) - restart linagliptin-metformin as no dye studies planned - SSI    Essential hypertension - BP up this am, but generally well-controlled    Chronic diastolic congestive heart failure ([place]) - dry wt about 190 - up 4 lbs - not on diuretic PTA - CXR OK and no edema - follow, consider PRN Lasix at d/c    Complete heart block [facility]) - s/p [employer] DR model [id number] (serial number [id number] )     Pneumothorax - after PPM - TCTS managing chest tube, possible removal in am - encourage incentive spirometry  Plan: OK to increase activity as tolerated. MD advise if pt likely a d/c after chest tube out.  Signed, Lenoard Aden 8:28 AM 09/05/2015   Patient seen, examined. Available data reviewed. Agree with findings, assessment, and plan as outlined by Rosaria Ferries, PA-C. Husband and daughter bedside. Rhythm stable. Plans to DC chest tube tomorrow pending CXR. Cardiac status stable. Anticipate DC home once chest tube out.  Sherren Mocha, M.D. 09/05/2015 12:02 PM

## 2015-09-05 NOTE — Progress Notes (Signed)
UR Completed. Franke Menter, RN, BSN.  [phone] 

## 2015-09-05 NOTE — Care Management Important Message (Signed)
Important Message  Patient Details  Name: Catherine [name] MRN: [id number] Date of Birth: 1941/03/07   Medicare Important Message Given:  Yes-third notification given    Delorse Lek 09/05/2015, 12:56 PM

## 2015-09-05 NOTE — Plan of Care (Signed)
Problem: Phase II - Intermediate Post-Op Goal: Activity Progressed Outcome: Progressing Pt has ambulated x 3 this shift total of 850 feet while pushing wheel chair in front of her. She tolerated well x 2 and complained of shortness of breath x 1 and had to be pushed back to her bedroom in the w/c. No other complaints voiced. She continues to make progress.

## 2015-09-05 NOTE — Progress Notes (Addendum)
      [address]       [place] [address]             [phone]      5 Days Post-Op Procedure(s) (LRB): Pacemaker Implant (N/A)   Subjective:  Catherine [name] has no complaints.  She is ambulating with assistance.  Objective: Vital signs in last 24 hours: Temp:  [98.2 F (36.8 C)-98.7 F (37.1 C)] 98.2 F (36.8 C) (10/03 0542) Pulse Rate:  [89-93] 93 (10/03 0542) Cardiac Rhythm:  [-] Ventricular paced (10/03 0800) Resp:  [16-19] 16 (10/03 0542) BP: (123-166)/(48-89) 146/50 mmHg (10/03 0542) SpO2:  [98 %-100 %] 98 % (10/03 0542) Weight:  [194 lb 0.1 oz (88 kg)] 194 lb 0.1 oz (88 kg) (10/03 0500)  Intake/Output from previous day: 10/02 0701 - 10/03 0700 In: 123 [P.O.:120; I.V.:3] Out: 874 [Urine:850; Chest Tube:24] Intake/Output this shift: Total I/O In: -  Out: 110 [Chest Tube:110]  General appearance: alert, cooperative and no distress Heart: regular rate and rhythm and paced Lungs: clear to auscultation bilaterally Abdomen: soft, non-tender; bowel sounds normal; no masses,  no organomegaly Wound: clean and dry  Lab Results: No results for input(s): WBC, HGB, HCT, PLT in the last 72 hours. BMET: No results for input(s): NA, K, CL, CO2, GLUCOSE, BUN, CREATININE, CALCIUM in the last 72 hours.  PT/INR: No results for input(s): LABPROT, INR in the last 72 hours. ABG    Component Value Date/Time   PHART 7.356 08/30/2015 1434   HCO3 26.5* 08/30/2015 1434   TCO2 27 08/30/2015 1947   O2SAT 99.0 08/30/2015 1434   CBG (last 3)   Recent Labs  09/02/15 2102  GLUCAP 123*    Assessment/Plan: S/P Procedure(s) (LRB): Pacemaker Implant (N/A)  1. CV-CHB, S/P PPM, S/P TAVR 2. Pulm- Chest tube without air leak, will transition to water seal, hopefully can d/c chest tube in AM 3. Dispo- patient is stable, will repeat CXR in AM,    LOS: 6 days    Catherine [name] 09/05/2015  I have seen and examined the patient and agree with the assessment and plan  as outlined.  Rexene Alberts, MD 09/05/2015 8:16 AM

## 2015-09-06 ENCOUNTER — Inpatient Hospital Stay (HOSPITAL_COMMUNITY): Payer: Medicare Other

## 2015-09-06 LAB — URINALYSIS, ROUTINE W REFLEX MICROSCOPIC
BILIRUBIN URINE: NEGATIVE
Glucose, UA: NEGATIVE mg/dL
Hgb urine dipstick: NEGATIVE
KETONES UR: NEGATIVE mg/dL
Leukocytes, UA: NEGATIVE
NITRITE: NEGATIVE
PH: 5.5 (ref 5.0–8.0)
Protein, ur: NEGATIVE mg/dL
Specific Gravity, Urine: 1.017 (ref 1.005–1.030)
UROBILINOGEN UA: 0.2 mg/dL (ref 0.0–1.0)

## 2015-09-06 LAB — GLUCOSE, CAPILLARY
GLUCOSE-CAPILLARY: 83 mg/dL (ref 65–99)
Glucose-Capillary: 120 mg/dL — ABNORMAL HIGH (ref 65–99)
Glucose-Capillary: 81 mg/dL (ref 65–99)
Glucose-Capillary: 121 mg/dL — ABNORMAL HIGH (ref 65–99)

## 2015-09-06 MED ORDER — DIPHENHYDRAMINE HCL 50 MG/ML IJ SOLN: 12.5000 mg | Freq: Four times a day (QID) | INTRAMUSCULAR | Status: DC | PRN

## 2015-09-06 MED ORDER — CIPROFLOXACIN HCL 500 MG PO TABS
500.0000 mg | ORAL_TABLET | Freq: Two times a day (BID) | ORAL | Status: DC
  Administered 2015-09-06 – 2015-09-07 (×3): 500 mg via ORAL
  Filled 2015-09-06 (×3): qty 1

## 2015-09-06 MED ORDER — DOCUSATE SODIUM 100 MG PO CAPS
100.0000 mg | ORAL_CAPSULE | Freq: Two times a day (BID) | ORAL | Status: DC
  Administered 2015-09-06 – 2015-09-07 (×2): 100 mg via ORAL
  Filled 2015-09-06 (×2): qty 1

## 2015-09-06 MED ORDER — BISACODYL 10 MG RE SUPP: 10.0000 mg | Freq: Every day | RECTAL | Status: DC | PRN

## 2015-09-06 MED ORDER — FLEET ENEMA 7-19 GM/118ML RE ENEM
1.0000 | ENEMA | Freq: Once | RECTAL | Status: AC
  Administered 2015-09-06: 1 via RECTAL
  Filled 2015-09-06: qty 1

## 2015-09-06 MED ORDER — TAMSULOSIN HCL 0.4 MG PO CAPS
0.4000 mg | ORAL_CAPSULE | Freq: Every day | ORAL | Status: DC
  Administered 2015-09-06 – 2015-09-07 (×2): 0.4 mg via ORAL
  Filled 2015-09-06 (×2): qty 1

## 2015-09-06 MED ORDER — LACTULOSE 10 GM/15ML PO SOLN
10.0000 g | Freq: Every day | ORAL | Status: DC | PRN
  Administered 2015-09-06: 10 g via ORAL
  Filled 2015-09-06: qty 15

## 2015-09-06 NOTE — Progress Notes (Signed)
Patient Name: Catherine [name] Date of Encounter: 09/06/2015     Principal Problem:   S/P TAVR (transcatheter aortic valve replacement) Active Problems:   Aortic valve stenosis, critical   Type II diabetes mellitus (HCC)   Essential hypertension   Chronic diastolic congestive heart failure (HCC)   Complete heart block (HCC)   Pneumothorax    SUBJECTIVE  Feeling well today. Still constipated. Plans on walking around today. No CP, SOB,  Orthopnea or LE edema.  CURRENT MEDS . aspirin EC  81 mg Oral Daily  . atorvastatin  20 mg Oral Q lunch  . citalopram  20 mg Oral Q lunch  . clonazePAM  1 mg Oral QHS  . clopidogrel  75 mg Oral Q breakfast  . docusate sodium  100 mg Oral BID  . insulin aspart  0-15 Units Subcutaneous TID WC  . levothyroxine  100 mcg Oral QAC breakfast  . linagliptin  2.5 mg Oral Q supper  . lisinopril  10 mg Oral Daily  . metFORMIN  500 mg Oral Q supper  . pantoprazole  40 mg Oral Daily  . sodium chloride  3 mL Intravenous Q12H    OBJECTIVE  Filed Vitals:   09/05/15 0542 09/05/15 1011 09/05/15 1345 09/05/15 2122  BP: 146/50 152/44 123/42 133/40  Pulse: 93 98 77 84  Temp: 98.2 F (36.8 C) 98.5 F (36.9 C) 98 F (36.7 C) 98.6 F (37 C)  TempSrc: Oral Oral Oral Oral  Resp: 16 20 18 18   Height:      Weight:      SpO2: 98% 98% 99% 99%    Intake/Output Summary (Last 24 hours) at 09/06/15 1025 Last data filed at 09/06/15 0800  Gross per 24 hour  Intake    720 ml  Output    500 ml  Net    220 ml   Filed Weights   09/03/15 0443 09/04/15 0444 09/05/15 0500  Weight: 86.5 kg (190 lb 11.2 oz) 87.181 kg (192 lb 3.2 oz) 88 kg (194 lb 0.1 oz)    PHYSICAL EXAM  General: Pleasant, NAD. Neuro: Alert and oriented X 3. Moves all extremities spontaneously. Psych: Normal affect. HEENT:  Normal  Neck: Supple without bruits or JVD. Lungs:  Resp regular and unlabored, CTA. Chest tube in place.  Heart: RRR no s3, s4, SEM @ RUSB with rad to  carotids. Abdomen: Soft, non-tender, non-distended, BS + x 4.  Extremities: No clubbing, cyanosis or edema. DP/PT/Radials 2+ and equal bilaterally.  TELE  V paced.  Radiology/Studies  Dg Chest 2 View  09/02/2015   CLINICAL DATA:  Status post pacemaker placement, follow-up of right-sided pneumothorax.  EXAM: CHEST  2 VIEW  COMPARISON:  PA and lateral chest x-ray of September 01, 2015  FINDINGS: Since a previous study the right-sided pneumothorax has increased in volume and amounts to approximately 30-40% of the lung volume. There is no mediastinal shift. The left lung is clear. The heart and pulmonary vascularity are normal. There is a right perihilar atelectasis. The permanent pacemaker is in reasonable position. The aortic valve cage remains visible. The bony thorax exhibits no acute abnormality.  IMPRESSION: Increasing right-sided pneumothorax now amounting to approximately 30% to 40% of the lung volume.  These results were called by telephone at the time of interpretation on 09/02/2015 at 7:52 am to [facility], RN, who verbally acknowledged these results.   Electronically Signed   By: David  Martinique M.D.   On: 09/02/2015 07:53   Dg  Chest 2 View  09/01/2015   CLINICAL DATA:  Status post permanent pacemaker placement  EXAM: CHEST  2 VIEW  COMPARISON:  Portable chest x-ray dated August 31, 2015  FINDINGS: There is a small right apical pneumothorax amounting. On the left the lung is well-expanded. There are small bilateral pleural effusions. The cardiac silhouette is normal. The pulmonary vascularity is normal. The permanent pacemaker is in reasonable position. A prosthetic aortic valve cage is visible. The bony thorax is unremarkable. There are degenerative changes of both shoulders.  IMPRESSION: There is a 5-10% right-sided hydropneumothorax new since pacemaker placement. There is a trace left pleural effusion. There is no pulmonary edema. These results were called by telephone at the time of  interpretation on 09/01/2015 at 7:18 am to Richarda Blade, RN, who verbally acknowledged these results.   Electronically Signed   By: David  Martinique M.D.   On: 09/01/2015 07:20   Dg Chest 2 View  08/25/2015   CLINICAL DATA:  Preoperative respiratory evaluation prior to aortic valve replacement (TAVR).  EXAM: CHEST  2 VIEW  COMPARISON:  CTA heart 08/17/2015. Chest x-rays 04/21/2010, 11/10/2006.  FINDINGS: Cardiac silhouette moderately enlarged, mildly increased in size since 2011. Thoracic aorta atherosclerotic, unchanged. Hilar and mediastinal contours otherwise unremarkable. Lungs clear. Bronchovascular markings normal. Pulmonary vascularity normal. No visible pleural effusions. No pneumothorax. Degenerative changes involving the thoracic spine and both shoulders. Old healed left lower rib fractures.  IMPRESSION: Moderate cardiomegaly.  No acute cardiopulmonary disease.   Electronically Signed   By: Evangeline Dakin M.D.   On: 08/25/2015 15:30   Mr Jodene Nam Head Wo Contrast  08/15/2015   CLINICAL DATA:  Patient with aortic stenosis. Severe daily headache, left-sided.  EXAM: MRI HEAD WITHOUT AND WITH CONTRAST  MRA HEAD WITHOUT CONTRAST  TECHNIQUE: Multiplanar, multiecho pulse sequences of the brain and surrounding structures were obtained without and with intravenous contrast. Angiographic images of the head were obtained using MRA technique without contrast.  CONTRAST:  15 cc MultiHance  COMPARISON:  09/11/2005  FINDINGS: MRI HEAD FINDINGS  Diffusion imaging does not show any acute or subacute infarction. The brainstem and cerebellum are normal. The cerebral hemispheres show minimal small vessel change of the white matter. No cortical or large vessel territory infarction. No mass lesion, hemorrhage, hydrocephalus or extra-axial collection. After contrast administration, an insignificant venous angioma in the left frontal lobe is seen. No evidence of previous hemorrhage associated with that. Dural enhancement appear  slightly prominent, but I think this relates to 3 T effect. No pituitary mass. No inflammatory sinus disease. No skull or skullbase lesion.  MRA HEAD FINDINGS  Both internal carotid arteries are widely patent into the brain. The anterior and middle cerebral vessels are patent without proximal stenosis, aneurysm or vascular malformation. A1 segment on the right is hypoplastic, with both anterior cerebral arteries receiving there supply from the left carotid circulation.  Both vertebral arteries are widely patent to the basilar. No basilar stenosis. Insignificant proximal vertebral fenestration. Posterior circulation branch vessels are patent. There is focal stenosis within the P 2 segment of the right posterior cerebral artery which could place the patient at risk of infarction in that territory.  IMPRESSION: MRI brain: No acute finding. Minimal small vessel change of the cerebral hemispheric white matter.  Intracranial MR angiography: No aneurysm or vascular malformation. Stenosis in the right P2 segment which could place the patient at risk of PCA infarction.   Electronically Signed   By: Nelson Chimes M.D.   On:  08/15/2015 15:20   Mr Jeri Cos AQ Contrast  08/15/2015   CLINICAL DATA:  Patient with aortic stenosis. Severe daily headache, left-sided.  EXAM: MRI HEAD WITHOUT AND WITH CONTRAST  MRA HEAD WITHOUT CONTRAST  TECHNIQUE: Multiplanar, multiecho pulse sequences of the brain and surrounding structures were obtained without and with intravenous contrast. Angiographic images of the head were obtained using MRA technique without contrast.  CONTRAST:  15 cc MultiHance  COMPARISON:  09/11/2005  FINDINGS: MRI HEAD FINDINGS  Diffusion imaging does not show any acute or subacute infarction. The brainstem and cerebellum are normal. The cerebral hemispheres show minimal small vessel change of the white matter. No cortical or large vessel territory infarction. No mass lesion, hemorrhage, hydrocephalus or extra-axial  collection. After contrast administration, an insignificant venous angioma in the left frontal lobe is seen. No evidence of previous hemorrhage associated with that. Dural enhancement appear slightly prominent, but I think this relates to 3 T effect. No pituitary mass. No inflammatory sinus disease. No skull or skullbase lesion.  MRA HEAD FINDINGS  Both internal carotid arteries are widely patent into the brain. The anterior and middle cerebral vessels are patent without proximal stenosis, aneurysm or vascular malformation. A1 segment on the right is hypoplastic, with both anterior cerebral arteries receiving there supply from the left carotid circulation.  Both vertebral arteries are widely patent to the basilar. No basilar stenosis. Insignificant proximal vertebral fenestration. Posterior circulation branch vessels are patent. There is focal stenosis within the P 2 segment of the right posterior cerebral artery which could place the patient at risk of infarction in that territory.  IMPRESSION: MRI brain: No acute finding. Minimal small vessel change of the cerebral hemispheric white matter.  Intracranial MR angiography: No aneurysm or vascular malformation. Stenosis in the right P2 segment which could place the patient at risk of PCA infarction.   Electronically Signed   By: Nelson Chimes M.D.   On: 08/15/2015 15:20   Ct Coronary Morp W/cta Cor W/score W/ca W/cm &/or Wo/cm  08/22/2015   ADDENDUM REPORT: 08/22/2015 08:04 ADDENDUM: See separate dictation for contemporaneously obtained CTA of the chest, abdomen and pelvis dated 08/17/2015 for full description of extracardiac findings. Electronically Signed   By: Vinnie Langton M.D.   On: 08/22/2015 08:04  08/22/2015   CLINICAL DATA:  Aortic stenosis  EXAM: Cardiac TAVR CT  MEDICATIONS: None  TECHNIQUE: The patient was scanned on a Philips [id number] slice scanner. Gantry rotation speed was 270 msecs. Collimation was .58mm. A 100 kV prospective scan was triggered in the  descending thoracic aorta at 111 HU's with 5% padding centered around 78% of the R-R interval. Average HR during the scan was bpm. The 3D data set was interpreted on a dedicated work station using MPR, MIP and VRT modes. A total of 80cc of contrast was used.  The patient was scanned on a Philips 256 scanner. A 120 kV retrospective scan was triggered in the descending thoracic aorta at 111 HU's. Gantry rotation speed was 270 msecs and collimation was .9 mm. No beta blockade or nitro were given. The 3D data set was reconstructed in 5% intervals of the R-R cycle. Systolic and diastolic phases were analyzed on a dedicated work station using MPR, MIP and VRT modes. The patient received 80 cc of contrast.  Aortic Valve: Trileaflet and heavily calcified No significant annular calcification  Aorta: Normal origin great vessel moderate calcification of descending thoracic aorta  Sinotubular Junction:  24.6 mm  Ascending Thoracic Aorta:  29 mm  Aortic Arch:  25 mm  Descending Thoracic Aorta:  21 mm  Sinus of Valsalva Measurements:  Non-coronary:  27 mm  Right -coronary:  26.5 mm  Left -coronary:  29 mm  Coronary Artery Height above Annulus:  Left Main:  11.7 mm  Right Coronary:  14.5 mm  Virtual Basal Annulus Measurements:  Maximum/Minimum Diameter:  26 mm x 21 mm  Perimeter:  74 mm  Area:  426 mm2  Coronary Arteries:  Sufficient height above annulus for deployment  Optimum Fluoroscopic Angle for Delivery: LAO 14 degrees / Caudal 16 degrees  IMPRESSION: 1) Trileaflet aortic valve suitable for either a 23 or 26 mm Sapien 3 valve. Area of 426 mm2 at upper range limit for 23 mm valve Balloon sizing may be appropriate during procedure  2) Sufficient height of coronary arteries for deployment  3) Optimum angle for delivery LAO 14 degrees Caudal 16 degrees  4) No LAA thrombus  Jenkins Rouge  Electronically Signed: By: Jenkins Rouge M.D. On: 08/17/2015 15:29   Dg Chest Port 1 View  09/06/2015   ADDENDUM REPORT: 09/06/2015 07:44  ADDENDUM: The impression in the report above is normal. The statements under " Comparison" should be under "Findings". The statements under "Findings" indicating normal heart size and mediastinal contours is erroneous. The corrected report format is as follows: Patient Name: TYREE, VANDRUFF, DOB: 04-Jan-1941 Accession: [id number] Addition Info: Procedure Code: 71010.4, Referring Physician: Darylene Price, H Last Known Patient Class: U Visit Location:[facility] EXAM: Portable chest one view. COMPARISON:  Portable chest x-ray of September 05, 2015. FINDINGS: A small right apical pneumothorax persists. It is slightly smaller today. The pigtail catheter is unchanged in position on the right. There is no right pleural effusion. On the left lower lobe atelectasis or infiltrate with small effusion is present. The cardiac silhouette remains enlarged. The prosthetic aortic valve cage is again demonstrated. The pulmonary vascularity is not engorged. Stable appearance of the permanent pacemaker. The bony structures are unremarkable. IMPRESSION: Persistent tiny right apical pneumothorax. The chest tube is unchanged in appearance. There is no significant right-sided effusion. Persistent left lower lobe atelectasis-pneumonia with small left pleural effusion. Stable cardiomegaly. Electronically Signed   By: David  Martinique M.D.   On: 09/06/2015 07:44  09/06/2015   CLINICAL DATA:  Follow-up right-sided pneumothorax, chest tube treatment of same.  EXAM: PORTABLE CHEST 1 VIEW  COMPARISON:  A small right apical pneumothorax persists. It is slightly smaller today. The pigtail catheter is unchanged in position. There is no right pleural effusion. On the left lower lobe atelectasis or infiltrate with small effusion is present. The cardiac silhouette remains enlarged. The prosthetic aortic valve cage is again demonstrated. The pulmonary vascularity is not engorged. Stable appearance of the permanent pacemaker. The bony structures are unremarkable.   FINDINGS: The heart size and mediastinal contours are within normal limits. Both lungs are clear. The visualized skeletal structures are unremarkable.  IMPRESSION: Persistent tiny right apical pneumothorax. The chest tube is unchanged in appearance. There is no significant right-sided pleural effusion. Persistent left lower lobe atelectasis -pneumonia with small left pleural effusion. Stable cardiomegaly.  Electronically Signed: By: David  Martinique M.D. On: 09/06/2015 07:33   Dg Chest Port 1 View  09/05/2015   CLINICAL DATA:  Pneumothorax, right.  Chest tubes.  EXAM: PORTABLE CHEST 1 VIEW  COMPARISON:  09/04/2015.  FINDINGS: Trachea is midline. Heart is enlarged. Aortic valve replacement. Right subclavian pacemaker lead tips project over the right atrium and right ventricle.  Trace residual right apical pneumothorax with pigtail catheter terminating in the lateral right hemi thorax. Linear right perihilar and mild bibasilar atelectasis, left greater than right, with a small left pleural effusion. Aeration in the right lung base has improved in the interval.  IMPRESSION: 1. Trace residual right apical pneumothorax with right chest tube in place. 2. Improving right basilar atelectasis. 3. Linear right perihilar atelectasis and mild left basilar atelectasis, stable. 4. Small left pleural effusion, stable.   Electronically Signed   By: Lorin Picket M.D.   On: 09/05/2015 08:00   Dg Chest Port 1 View  09/04/2015   CLINICAL DATA:  Aortic valve replacement and pacemaker insertion. Follow-up pneumothorax.  EXAM: PORTABLE CHEST 1 VIEW  COMPARISON:  09/03/2015  FINDINGS: Cardiomegaly and aortic valve replacement noted.  A right sided pacemaker and right thoracostomy tube again noted and unchanged.  A 10-15% right apical pneumothorax is now identified.  Bibasilar atelectasis and probable trace effusions noted.  No other changes identified.  IMPRESSION: Small right apical pneumothorax now identified, 10-15%, without  other interval change. Right thoracostomy tube again identified.  Mild bibasilar atelectasis and possible trace effusions.  Cardiomegaly and aortic valve replacement.  Results were called by telephone at the time of interpretation on 09/04/2015 at 9:27 am to [facility], [place], who verbally acknowledged these results.   Electronically Signed   By: Margarette Canada M.D.   On: 09/04/2015 09:29   Dg Chest Port 1 View  09/03/2015   CLINICAL DATA:  Pneumothorax.  Chest tube  EXAM: PORTABLE CHEST 1 VIEW  COMPARISON:  09/02/2015  FINDINGS: Pleural catheter on the right is unchanged. No pneumothorax identified. Pacemaker on the right obscures evaluation of a portion of the right apex.  Dual lead pacemaker in good position. Aortic valve replacement noted. Negative for heart failure. Mild left lower lobe atelectasis.  IMPRESSION: Right pleural catheter remains in place.  No pneumothorax.   Electronically Signed   By: Franchot Gallo M.D.   On: 09/03/2015 09:34   Dg Chest Port 1 View  09/02/2015   CLINICAL DATA:  Chest tube placement  EXAM: PORTABLE CHEST 1 VIEW  COMPARISON:  09/02/2015  FINDINGS: Pigtail catheter in the right pleural space with a persistent small right apical pneumothorax which is decreased in size compared with earlier film performed same day. There is a dual lead right-sided cardiac pacemaker. There is a linear band of airspace disease in the right perihilar region consistent with atelectasis. There is no other focal parenchymal opacity. There is no pleural effusion or pneumothorax. The heart and mediastinal contours are unremarkable.  The osseous structures are unremarkable.  IMPRESSION: Interval placement of a right-sided chest tube with a small persistent right apical pneumothorax which has decreased in size compared with the earlier x-ray.   Electronically Signed   By: Kathreen Devoid   On: 09/02/2015 10:21   Dg Chest Port 1 View  08/31/2015   CLINICAL DATA:  Postop valve repair.  EXAM: PORTABLE CHEST 1 VIEW   COMPARISON:  08/30/2015  FINDINGS: Transvenous pacer wire tip is in the right ventricle, entering from the IVC, stable. Changes of aortic valve repair interval removal of Swan-Ganz catheter. Mild cardiomegaly. No confluent opacities or effusions. No acute bony abnormality. No pneumothorax.  IMPRESSION: No acute findings.  Mild cardiomegaly.   Electronically Signed   By: Rolm Baptise M.D.   On: 08/31/2015 07:35   Dg Chest Port 1 View  08/30/2015   CLINICAL DATA:  Severe aortic stenosis.  EXAM:  PORTABLE CHEST 1 VIEW  COMPARISON:  08/25/2015.  FINDINGS: The patient has undergone transcatheter aortic valve replacement. Grossly satisfactory positioning of the valve over the heart. Cardiomegaly. Swan-Ganz catheter tip RIGHT main pulmonary artery. Probable temporary pacer lead from a femoral approach, lying within the RIGHT ventricle. Mild vascular congestion. No infiltrates or overt edema. No osseous findings.  IMPRESSION: Satisfactory postoperative TAVR.   Electronically Signed   By: Staci Righter M.D.   On: 08/30/2015 15:22   Ct Angio Chest Aorta W/cm &/or Wo/cm  08/22/2015   CLINICAL DATA:  74 year old female with history of severe aortic stenosis. Pre procedural study prior to potential transcatheter aortic valve replacement (TAVR) procedure.  EXAM: CT ANGIOGRAPHY CHEST, ABDOMEN AND PELVIS  TECHNIQUE: Multidetector CT imaging through the chest, abdomen and pelvis was performed using the standard protocol during bolus administration of intravenous contrast. Multiplanar reconstructed images and MIPs were obtained and reviewed to evaluate the vascular anatomy.  CONTRAST:  80 mL of Omnipaque 350.  COMPARISON:  CT the abdomen and pelvis 06/27/2014.  FINDINGS: CTA CHEST FINDINGS  Mediastinum/Lymph Nodes: Heart size is mildly enlarged. There is no significant pericardial fluid, thickening or pericardial calcification. Severe thickening calcification of the aortic valve. There is atherosclerosis of the thoracic aorta,  the great vessels of the mediastinum and the coronary arteries, including calcified atherosclerotic plaque in the left anterior descending and right coronary arteries. No pathologically enlarged mediastinal or hilar lymph nodes. Esophagus is unremarkable in appearance. No axillary lymphadenopathy.  Lungs/Pleura: 3 mm subpleural nodule in the posterior aspect of the left lower lobe (image 35 of series 407), unchanged compared to 06/27/2014, considered benign. No other larger more suspicious appearing pulmonary nodules or masses are otherwise noted. No acute consolidative airspace disease. No pleural effusions.  Musculoskeletal/Soft Tissues: Status post left modified radical mastectomy. There are no aggressive appearing lytic or blastic lesions noted in the visualized portions of the skeleton.  CTA ABDOMEN AND PELVIS FINDINGS  Hepatobiliary: Mild diffuse decreased attenuation throughout the hepatic parenchyma, suggestive of hepatic steatosis (difficult to say for certain on today's contrast enhanced study). No focal cystic or solid hepatic lesions. No intra or extrahepatic biliary ductal dilatation. Gallbladder is normal in appearance.  Pancreas: No pancreatic mass. No pancreatic ductal dilatation. No pancreatic or peripancreatic fluid or inflammatory changes.  Spleen: Unremarkable.  Adrenals/Urinary Tract: Bilateral adrenal glands and the left kidney are normal in appearance. Exophytic 1.4 cm high attenuation (64 HU) lesion in the lateral aspect of the lower pole of the right kidney is incompletely characterized, but appear similar to prior study 06/27/2014, likely a proteinaceous/hemorrhagic cyst. Sub cm low-attenuation lesion in the upper pole of the right kidney is too small to definitively characterize, but is also stable compared to the prior study, likely a tiny cyst. No hydroureteronephrosis. Urinary bladder is unremarkable in appearance.  Stomach/Bowel: The appearance of the stomach is normal. No pathologic  dilatation of small bowel or colon.  Vascular/Lymphatic: Vascular findings and measurements pertinent to potential TAVR, as detailed below. No aneurysm identified. Small amount of saw tissue stranding and high attenuation fluid around the right common femoral artery, likely a small amount of hematoma following recent vascular access. No lymphadenopathy noted in the abdomen or pelvis.  Reproductive: Status post hysterectomy. Right ovary is unremarkable in appearance. Left ovary is not confidently identified may be surgically absent or atrophic.  Other: No significant volume of ascites.  No pneumoperitoneum.  Musculoskeletal: There are no aggressive appearing lytic or blastic lesions noted in the visualized  portions of the skeleton.  VASCULAR MEASUREMENTS PERTINENT TO TAVR:  AORTA:  Minimal Aortic Diameter -  13.6 x 11.0 mm  Severity of Aortic Calcification -  moderate  RIGHT PELVIS:  Right Common Iliac Artery -  Minimal Diameter - 7.7 x 6.2 mm  Tortuosity - Mild  Calcification - Mild to moderate  Right External Iliac Artery -  Minimal Diameter - 6.7 x 6.6 mm  Tortuosity - Mild  Calcification - None  Right Common Femoral Artery -  Minimal Diameter - 7.3 x 6.9 mm  Tortuosity - Mild  Calcification - Mild  LEFT PELVIS:  Left Common Iliac Artery -  Minimal Diameter - 7.3 x 8.9 mm  Tortuosity - Mild  Calcification - Mild  Left External Iliac Artery -  Minimal Diameter - 5.5 x 4.9 mm  Tortuosity - Mild  Calcification - None  Left Common Femoral Artery -  Minimal Diameter - 6.3 x 6.5 mm  Tortuosity - Mild  Calcification - Mild  Review of the MIP images confirms the above findings.  IMPRESSION: 1. Vascular findings and measurements pertinent to potential TAVR procedure, as discussed above. The patient does appear to have suitable pelvic arterial access on the right side. 2. Extensive thickening and calcification of the aortic valve, compatible with the reported clinical history of severe aortic stenosis. 3. Stable lesions in  the right kidney, as discussed above, suggestive of a small hemorrhagic/proteinaceous cyst, and a small simple cyst. 4. Probable hepatic steatosis. 5. Additional incidental findings, as above.   Electronically Signed   By: Vinnie Langton M.D.   On: 08/22/2015 08:49   Ct Angio Abd/pel W/ And/or W/o  08/22/2015   CLINICAL DATA:  74 year old female with history of severe aortic stenosis. Pre procedural study prior to potential transcatheter aortic valve replacement (TAVR) procedure.  EXAM: CT ANGIOGRAPHY CHEST, ABDOMEN AND PELVIS  TECHNIQUE: Multidetector CT imaging through the chest, abdomen and pelvis was performed using the standard protocol during bolus administration of intravenous contrast. Multiplanar reconstructed images and MIPs were obtained and reviewed to evaluate the vascular anatomy.  CONTRAST:  80 mL of Omnipaque 350.  COMPARISON:  CT the abdomen and pelvis 06/27/2014.  FINDINGS: CTA CHEST FINDINGS  Mediastinum/Lymph Nodes: Heart size is mildly enlarged. There is no significant pericardial fluid, thickening or pericardial calcification. Severe thickening calcification of the aortic valve. There is atherosclerosis of the thoracic aorta, the great vessels of the mediastinum and the coronary arteries, including calcified atherosclerotic plaque in the left anterior descending and right coronary arteries. No pathologically enlarged mediastinal or hilar lymph nodes. Esophagus is unremarkable in appearance. No axillary lymphadenopathy.  Lungs/Pleura: 3 mm subpleural nodule in the posterior aspect of the left lower lobe (image 35 of series 407), unchanged compared to 06/27/2014, considered benign. No other larger more suspicious appearing pulmonary nodules or masses are otherwise noted. No acute consolidative airspace disease. No pleural effusions.  Musculoskeletal/Soft Tissues: Status post left modified radical mastectomy. There are no aggressive appearing lytic or blastic lesions noted in the visualized  portions of the skeleton.  CTA ABDOMEN AND PELVIS FINDINGS  Hepatobiliary: Mild diffuse decreased attenuation throughout the hepatic parenchyma, suggestive of hepatic steatosis (difficult to say for certain on today's contrast enhanced study). No focal cystic or solid hepatic lesions. No intra or extrahepatic biliary ductal dilatation. Gallbladder is normal in appearance.  Pancreas: No pancreatic mass. No pancreatic ductal dilatation. No pancreatic or peripancreatic fluid or inflammatory changes.  Spleen: Unremarkable.  Adrenals/Urinary Tract: Bilateral adrenal glands and the  left kidney are normal in appearance. Exophytic 1.4 cm high attenuation (64 HU) lesion in the lateral aspect of the lower pole of the right kidney is incompletely characterized, but appear similar to prior study 06/27/2014, likely a proteinaceous/hemorrhagic cyst. Sub cm low-attenuation lesion in the upper pole of the right kidney is too small to definitively characterize, but is also stable compared to the prior study, likely a tiny cyst. No hydroureteronephrosis. Urinary bladder is unremarkable in appearance.  Stomach/Bowel: The appearance of the stomach is normal. No pathologic dilatation of small bowel or colon.  Vascular/Lymphatic: Vascular findings and measurements pertinent to potential TAVR, as detailed below. No aneurysm identified. Small amount of saw tissue stranding and high attenuation fluid around the right common femoral artery, likely a small amount of hematoma following recent vascular access. No lymphadenopathy noted in the abdomen or pelvis.  Reproductive: Status post hysterectomy. Right ovary is unremarkable in appearance. Left ovary is not confidently identified may be surgically absent or atrophic.  Other: No significant volume of ascites.  No pneumoperitoneum.  Musculoskeletal: There are no aggressive appearing lytic or blastic lesions noted in the visualized portions of the skeleton.  VASCULAR MEASUREMENTS PERTINENT TO  TAVR:  AORTA:  Minimal Aortic Diameter -  13.6 x 11.0 mm  Severity of Aortic Calcification -  moderate  RIGHT PELVIS:  Right Common Iliac Artery -  Minimal Diameter - 7.7 x 6.2 mm  Tortuosity - Mild  Calcification - Mild to moderate  Right External Iliac Artery -  Minimal Diameter - 6.7 x 6.6 mm  Tortuosity - Mild  Calcification - None  Right Common Femoral Artery -  Minimal Diameter - 7.3 x 6.9 mm  Tortuosity - Mild  Calcification - Mild  LEFT PELVIS:  Left Common Iliac Artery -  Minimal Diameter - 7.3 x 8.9 mm  Tortuosity - Mild  Calcification - Mild  Left External Iliac Artery -  Minimal Diameter - 5.5 x 4.9 mm  Tortuosity - Mild  Calcification - None  Left Common Femoral Artery -  Minimal Diameter - 6.3 x 6.5 mm  Tortuosity - Mild  Calcification - Mild  Review of the MIP images confirms the above findings.  IMPRESSION: 1. Vascular findings and measurements pertinent to potential TAVR procedure, as discussed above. The patient does appear to have suitable pelvic arterial access on the right side. 2. Extensive thickening and calcification of the aortic valve, compatible with the reported clinical history of severe aortic stenosis. 3. Stable lesions in the right kidney, as discussed above, suggestive of a small hemorrhagic/proteinaceous cyst, and a small simple cyst. 4. Probable hepatic steatosis. 5. Additional incidental findings, as above.   Electronically Signed   By: Vinnie Langton M.D.   On: 08/22/2015 08:49    ASSESSMENT AND PLAN  74 yo female w/ severe AS, RA, DM, hypothyroid, HTN, HDL, IBS, anemia, breast cancer s/p surgery admitted 09/27 for TAVR, had CHB>>PPM after procedure, then had PTX>>chest tube by TCTS.   Critical aortic stenosis s/p TAVR on 4/69/62 complicated by CHB and temp pacer  -- Patient doing well from a post TAVR standoint -- 2D Echo reviewed and looks good with normal transvalvular gradients and no AI.   Complete heart block  -- S/p [facility] Assurity DR model [phone]  (serial number [id number] ) complicated by tiny right apical pneumothorax requiring chest tube placement. Plan for removal today. ( PPM placed on right side due to previous breast cancer surgery on left ) -- Encourage incentive spirometry  Type II diabetes mellitus  -- Linagliptin-metformin resumed  HTN -- Stable  Chronic diastolic congestive heart failure  -- Appears euvolemic.   Plan: OK to increase activity as tolerated. Will be discharged today or tomorrow depending on when chest tube is pulled and need for observation. Waiting for recommendations per [facility].   Judy Pimple PA-C  Pager [phone]  Patient seen, examined. Available data reviewed. Agree with findings, assessment, and plan as outlined by Nell Range, PA-C. Pt remains stable, BP controlled, no evidence of volume overload on exam. Lungs CTA and heart is RRR with 2/6 SEM at the RUSB. No peripheral edema. Dr Roxy Manns to see today re: chest tube management. Likely home this afternoon or tomorrow. Still having problems with constipation - laxatives and stool softener written.  Sherren Mocha, M.D. 09/06/2015 10:57 AM

## 2015-09-06 NOTE — Progress Notes (Addendum)
       [address]       [place] [address]             [phone]          6 Days Post-Op Procedure(s) (LRB): Pacemaker Implant (N/A)  Subjective: Feels well. Only complaint is constipation. Breathing stable and pain controlled.   Objective: Vital signs in last 24 hours: Patient Vitals for the past 24 hrs:  BP Temp Temp src Pulse Resp SpO2  09/05/15 2122 (!) 133/40 mmHg 98.6 F (37 C) Oral 84 18 99 %  09/05/15 1345 (!) 123/42 mmHg 98 F (36.7 C) Oral 77 18 99 %  09/05/15 1011 (!) 152/44 mmHg 98.5 F (36.9 C) Oral 98 20 98 %   Current Weight  09/05/15 194 lb 0.1 oz (88 kg)     Intake/Output from previous day: 10/03 0701 - 10/04 0700 In: 600 [P.O.:600] Out: 540 [Urine:500; Chest Tube:40]    PHYSICAL EXAM:  Heart: RRR, paced Lungs: Clear Wound: Clean and dry Chest tube: No air leak    Lab Results: CBC:No results for input(s): WBC, HGB, HCT, PLT in the last 72 hours. BMET: No results for input(s): NA, K, CL, CO2, GLUCOSE, BUN, CREATININE, CALCIUM in the last 72 hours.  PT/INR: No results for input(s): LABPROT, INR in the last 72 hours.  CXR: EXAM: PORTABLE CHEST 1 VIEW  COMPARISON: A small right apical pneumothorax persists. It is slightly smaller today. The pigtail catheter is unchanged in position. There is no right pleural effusion. On the left lower lobe atelectasis or infiltrate with small effusion is present. The cardiac silhouette remains enlarged. The prosthetic aortic valve cage is again demonstrated. The pulmonary vascularity is not engorged. Stable appearance of the permanent pacemaker. The bony structures are unremarkable.  FINDINGS: The heart size and mediastinal contours are within normal limits. Both lungs are clear. The visualized skeletal structures are unremarkable.  IMPRESSION: Persistent tiny right apical pneumothorax. The chest tube is unchanged in appearance. There is no significant right-sided  pleural effusion. Persistent left lower lobe atelectasis -pneumonia with small left pleural effusion. Stable cardiomegaly.   Assessment/Plan: S/P Procedure(s) (LRB): Pacemaker Implant (N/A)  CXR with stable tiny right apical pneumothorax, CT with no air leak and very scant output. Hopefully can d/c CT today.  CHB- stable, S/P PPM.  GI- will resume stool softener at pt request and order LOC prn.  Overall doing well s/p TAVR.  Continue current care per cardiology.   LOS: 7 days    COLLINS,GINA H 09/06/2015  I have seen and examined the patient and agree with the assessment and plan as outlined.  D/C tube today.  Hopefully D/C home tomorrow.  Rexene Alberts, MD 09/06/2015 3:09 PM

## 2015-09-06 NOTE — Care Management Note (Signed)
Case Management Note  Patient Details  Name: Catherine [name] MRN: [id number] Date of Birth: [birth year]  Subjective/Objective:   Pt admitted is s/p TAVR, pacemaker placement that developed a right pneumothorax subsequently had chest tube inserted 09/02/15.              Action/Plan:  Pt is independent from home with spouse, uses cane at home with ambulation.  Pt also has walker available at home.  Pt may benefit from [facility] post discharge, CM will continue to monitor for disposition needs   Expected Discharge Date:                  Expected Discharge Plan:  [place] (Pt is from home with husband, uses cane for ambulation at home, daughter provides additonal support when needed)  In-House Referral:     Discharge planning Services  CM Consult  Post Acute Care Choice:    Choice offered to:     DME Arranged:    DME Agency:     HH Arranged:    [place] Agency:     Status of Service:  In process, will continue to follow  Medicare Important Message Given:  Yes-third notification given Date Medicare IM Given:    Medicare IM give by:    Date Additional Medicare IM Given:    Additional Medicare Important Message give by:     If discussed at [place] of Stay Meetings, dates discussed:  09/06/2015   Additional Comments: 09/06/2015 Pt continues to have CT, placed to water seal 09/05/15. CM will continue to monitor for disposition needs  09/02/15  CM assessed pt, no CM needs at this time Maryclare Labrador, RN 09/06/2015, 2:13 PM

## 2015-09-07 ENCOUNTER — Other Ambulatory Visit: Payer: Self-pay | Admitting: Physician Assistant

## 2015-09-07 ENCOUNTER — Inpatient Hospital Stay (HOSPITAL_COMMUNITY): Payer: Medicare Other

## 2015-09-07 DIAGNOSIS — E876 Hypokalemia: Secondary | ICD-10-CM

## 2015-09-07 DIAGNOSIS — C801 Malignant (primary) neoplasm, unspecified: Secondary | ICD-10-CM | POA: Diagnosis present

## 2015-09-07 DIAGNOSIS — K589 Irritable bowel syndrome without diarrhea: Secondary | ICD-10-CM | POA: Diagnosis present

## 2015-09-07 LAB — BASIC METABOLIC PANEL
ANION GAP: 10 (ref 5–15)
BUN: 7 mg/dL (ref 6–20)
CALCIUM: 8.5 mg/dL — AB (ref 8.9–10.3)
CO2: 30 mmol/L (ref 22–32)
CREATININE: 0.56 mg/dL (ref 0.44–1.00)
Chloride: 97 mmol/L — ABNORMAL LOW (ref 101–111)
Glucose, Bld: 107 mg/dL — ABNORMAL HIGH (ref 65–99)
Potassium: 3 mmol/L — ABNORMAL LOW (ref 3.5–5.1)
SODIUM: 137 mmol/L (ref 135–145)

## 2015-09-07 LAB — CBC
HCT: 28.1 % — ABNORMAL LOW (ref 36.0–46.0)
HEMOGLOBIN: 9.1 g/dL — AB (ref 12.0–15.0)
MCH: 27.7 pg (ref 26.0–34.0)
MCHC: 32.4 g/dL (ref 30.0–36.0)
MCV: 85.7 fL (ref 78.0–100.0)
PLATELETS: 239 10*3/uL (ref 150–400)
RBC: 3.28 MIL/uL — AB (ref 3.87–5.11)
RDW: 15.7 % — ABNORMAL HIGH (ref 11.5–15.5)
WBC: 6.8 10*3/uL (ref 4.0–10.5)

## 2015-09-07 LAB — GLUCOSE, CAPILLARY
GLUCOSE-CAPILLARY: 108 mg/dL — AB (ref 65–99)
Glucose-Capillary: 94 mg/dL (ref 65–99)

## 2015-09-07 LAB — URINE CULTURE: CULTURE: NO GROWTH

## 2015-09-07 MED ORDER — OXYCODONE-ACETAMINOPHEN 5-325 MG PO TABS: 1.0000 | ORAL_TABLET | Freq: Four times a day (QID) | ORAL | Status: DC | PRN

## 2015-09-07 MED ORDER — POTASSIUM CHLORIDE CRYS ER 10 MEQ PO TBCR: 10.0000 meq | EXTENDED_RELEASE_TABLET | Freq: Every day | ORAL | Status: DC

## 2015-09-07 MED ORDER — CLOPIDOGREL BISULFATE 75 MG PO TABS: 75.0000 mg | ORAL_TABLET | Freq: Every day | ORAL | Status: DC

## 2015-09-07 MED ORDER — PANTOPRAZOLE SODIUM 40 MG PO TBEC: 40.0000 mg | DELAYED_RELEASE_TABLET | Freq: Every day | ORAL | Status: DC

## 2015-09-07 MED ORDER — TAMSULOSIN HCL 0.4 MG PO CAPS: 0.4000 mg | ORAL_CAPSULE | Freq: Every day | ORAL | Status: DC

## 2015-09-07 MED ORDER — CIPROFLOXACIN HCL 500 MG PO TABS: 500.0000 mg | ORAL_TABLET | Freq: Two times a day (BID) | ORAL | Status: DC

## 2015-09-07 MED ORDER — POTASSIUM CHLORIDE CRYS ER 20 MEQ PO TBCR
40.0000 meq | EXTENDED_RELEASE_TABLET | Freq: Once | ORAL | Status: AC
  Administered 2015-09-07: 40 meq via ORAL
  Filled 2015-09-07: qty 2

## 2015-09-07 MED ORDER — LISINOPRIL 10 MG PO TABS: 10.0000 mg | ORAL_TABLET | Freq: Every day | ORAL | Status: DC

## 2015-09-07 NOTE — Discharge Instructions (Signed)
° ° °  Supplemental Discharge Instructions for  Pacemaker/Defibrillator Patients  Activity No heavy lifting or vigorous activity with your left/right arm for 6 to 8 weeks.  Do not raise your left/right arm above your head for one week.  Gradually raise your affected arm as drawn below.   WOUND CARE - Keep the wound area clean and dry.  Do not get this area wet for one week. No showers for one week; you may shower on   09/08/15  . - The tape/steri-strips on your wound will fall off; do not pull them off.  No bandage is needed on the site.  DO  NOT apply any creams, oils, or ointments to the wound area. - If you notice any drainage or discharge from the wound, any swelling or bruising at the site, or you develop a fever > 101? F after you are discharged home, call the office at once.  Special Instructions - You are still able to use cellular telephones; use the ear opposite the side where you have your pacemaker/defibrillator.  Avoid carrying your cellular phone near your device. - When traveling through airports, show security personnel your identification card to avoid being screened in the metal detectors.  Ask the security personnel to use the hand wand. - Avoid arc welding equipment, MRI testing (magnetic resonance imaging), TENS units (transcutaneous nerve stimulators).  Call the office for questions about other devices. - Avoid electrical appliances that are in poor condition or are not properly grounded. - Microwave ovens are safe to be near or to operate.

## 2015-09-07 NOTE — Progress Notes (Addendum)
       [address]       [place] [address]             [phone]          7 Days Post-Op Procedure(s) (LRB): Pacemaker Implant (N/A)  Subjective: Finally was able to have a bowel movement. Feels much better this am. Breathing stable.   Objective: Vital signs in last 24 hours: Patient Vitals for the past 24 hrs:  BP Temp Temp src Pulse Resp SpO2 Weight  09/07/15 0448 (!) 141/36 mmHg 97.9 F (36.6 C) Oral 76 18 96 % 193 lb 9 oz (87.8 kg)  09/06/15 2120 (!) 129/45 mmHg 98.4 F (36.9 C) Oral 84 18 95 % -  09/06/15 1349 (!) 122/46 mmHg 98.6 F (37 C) Oral 75 18 98 % -   Current Weight  09/07/15 193 lb 9 oz (87.8 kg)     Intake/Output from previous day: 10/04 0701 - 10/05 0700 In: 11 [P.O.:960] Out: 1501 [Urine:1500; Stool:1]    PHYSICAL EXAM:  Heart: RRR, paced Lungs: Clear Wound: Clean and dry Extremities: No edema     Lab Results: CBC: Recent Labs  09/07/15 0345  WBC 6.8  HGB 9.1*  HCT 28.1*  PLT 239   BMET:  Recent Labs  09/07/15 0345  NA 137  K 3.0*  CL 97*  CO2 30  GLUCOSE 107*  BUN 7  CREATININE 0.56  CALCIUM 8.5*    PT/INR: No results for input(s): LABPROT, INR in the last 72 hours.    Assessment/Plan: S/P Procedure(s) (LRB): Pacemaker Implant (N/A) Awaiting CXR this am. Will follow up CXR. If stable, ok from our standpoint to d/c home.   LOS: 8 days    COLLINS,GINA H 09/07/2015  I have seen and examined the patient and agree with the assessment and plan as outlined.  CXR looks good w/ no recurrence of PTX.  Okay to D/C home today  I spent in excess of 15 minutes during the conduct of this hospital encounter and >50% of this time involved direct face-to-face encounter with the patient for counseling and/or coordination of their care.    Rexene Alberts, MD 09/07/2015 8:44 AM

## 2015-09-07 NOTE — Discharge Summary (Signed)
Discharge Summary   Patient ID: Catherine [name],  MRN: [id number], DOB/AGE: 76-Apr-1942 76 y.o.  Admit date: 08/30/2015 Discharge date: 09/07/2015  Primary Care Provider: Jilda [name] Primary Cardiologist: Catherine Mocha, MD   Discharge Diagnoses Principal Problem:   S/P TAVR (transcatheter aortic valve replacement) Active Problems:   Aortic valve stenosis, critical   Complete heart block (HCC)   Pneumothorax   Type II diabetes mellitus (HCC)   Essential hypertension   Chronic diastolic congestive heart failure (HCC)   Hypothyroidism   Hypokalemia   IBS (irritable bowel syndrome)   Cancer (HCC)   Allergies Allergies  Allergen Reactions  . Sulfa Antibiotics Hives  . Latex Rash    Procedures Transcatheter Aortic Valve Replacement - Transfemoral Approach 08/30/15 Catherine [name] (size 26 mm, model # [id number], serial # [id number]) Temporary packer - 08/30/15 for CHB  Permanent [name] - 08/31/15 Catherine [name] [employer] model [phone] (serial number [id number]) right atrial lead and a [employer] model [phone] (serial number [id number]) right ventricular lead   Right chest tube placement - 09/02/15  for right pneumothroax  History of Present Illness  The patient is a 74 year old woman with rheumatoid arthritis, fibromyalgia, anemia, chronic depression, diabetes, hypertension and aortic stenosis that has been followed by echo. Her most recent echo on 07/25/2015 shows critical AS with a mean gradient of 64 mm Hg and an AVA of 0.46 cm2. LVEF is 55-60%. Cardiac cath on 08/11/2015 shows single vessel disease with a 70% mid LAD stenosis with heavy calcification of the entire LAD. There is mild non-obstructive disease in the RCA and LCX. She reports worsening exertional fatigue and increasing dizzy spells over the past year. She lives a sedentary lifestyle and her walking is slow and somewhat unsteady. She uses a cane for assistance. She has also been limited by problems with major  depression and anxiety.  During the course of the patient's preoperative work up they have been evaluated comprehensively by a multidisciplinary team of specialists coordinated through the Catherine [name] in the Catherine [name] and Catherine [name].   Hospital Course  The patient was presented 08/30/15 for schedule TVAR. Post TVAR patient had a persistent CHB and temporary paced was placed. The patient tolerated the procedure well and is transported to the surgical intensive care in stable condition.   9/28: She was pacing pacing 100% to 35bpm with her temporary pacer. She has not received any beta-blocker. Seen by EP and had successful implantation of a Catherine [name] for complete heart block, model [id number] (serial number [id number]). PPM placed on right side due to previous breast cancer surgery on left. S/p TVAR echo looked good with normal transvalvular gradients and no AI.  9/29: CXR showed 5-10% right-sided hydropneumothorax new since [name] placement. No SOB. Device interrogation was normal.   9/30: Worsen SOB. CXR showed pneumothorax increaseed to 30-40% of lung volume. Right chest tube placement done.   10/1: Breathing improved. Resolution of pneumothorax, Chest tube free from air leak. Continued suction. Dulcolax given for constipation.   10/2: Chest tube + air leak with cough. CXR with Right apical pneumothorax 10-15%. Started on lisinopril due to HTN.   10/3: Continued chest tube. Ambulated without difficulty. Restarted linagliptin-metformin for DM.   10/4: Complained of constipation, resumed stool softer.  Had good BM. CXR with stable tiny right apical pneumothorax, CT with no air leak and very scant output. Discontinued chest tube. Increased activity.   10/5: Felt much better.  Breathing stable. CXR showed well-expanded lung. Potassium given for hypokalemia.   She has been seen by Dr. Burt [name] and Dr. Roxy [name] today and deemed  ready for discharge home. All follow-up appointments have been scheduled. Discharge medications are listed below.   Resumed home dose of metoprolol. Discharge on KCL 10 meq daily and repeat labs in one week. Given scrip of oxycodone for pain.   Discharge Vitals Blood pressure 123/46, pulse 80, temperature 97.9 F (36.6 C), temperature source Oral, resp. rate 18, height 5\' 3"  (1.6 m), weight 193 lb 9 oz (87.8 kg), SpO2 96 %.  Filed Weights   09/04/15 0444 09/05/15 0500 09/07/15 0448  Weight: 192 lb 3.2 oz (87.181 kg) 194 lb 0.1 oz (88 kg) 193 lb 9 oz (87.8 kg)    Labs  CBC  Recent Labs  09/07/15 0345  WBC 6.8  HGB 9.1*  HCT 28.1*  MCV 85.7  PLT 546   Basic Metabolic Panel  Recent Labs  09/07/15 0345  NA 137  K 3.0*  CL 97*  CO2 30  GLUCOSE 107*  BUN 7  CREATININE 0.56  CALCIUM 8.5*    Disposition  Pt is being discharged home today in good condition.  Follow-up Plans & Appointments  Follow-up Information    Follow up with Catherine Alberts, MD. Go on 09/19/2015.   Specialty:  Cardiothoracic Surgery   Why:  @ 1pm   Contact information:   Catherine [name] [phone]       Follow up with Catherine Mocha, MD. Go on 10/07/2015.   Specialty:  Cardiology   Why:  @9 :30 for each and @10 :30 with Dr. Barnie [name] information:   Catherine [name]       Follow up with [facility] On 09/19/2015.   Why:  at 4pm for wound check   Contact information:   Catherine [name]       Follow up with [name] Office On 09/14/2015.   Specialty:  Cardiology   Why:  Between 7:30 am to 5pm for BMET (lab to check potassium)  - non fasting   Contact information:   [address] [place] [phone]          Discharge Instructions    Call MD for:  redness, tenderness, or  signs of infection (pain, swelling, redness, odor or green/yellow discharge around incision site)    Complete by:  As directed      Diet - low sodium heart healthy    Complete by:  As directed      Increase activity slowly    Complete by:  As directed            F/u Labs/Studies: none  Discharge Medications    Medication List    TAKE these medications        aspirin EC 81 MG tablet  Take 81 mg by mouth daily.     atorvastatin 40 MG tablet  Commonly known as:  LIPITOR  Take 20 mg by mouth daily with lunch.     ciprofloxacin 500 MG tablet  Commonly known as:  CIPRO  Take 1 tablet (500 mg total) by mouth 2 (two) times daily.     citalopram 40 MG tablet  Commonly known as:  CELEXA  Take 20 mg by mouth daily with lunch.  clonazePAM 1 MG tablet  Commonly known as:  KLONOPIN  Take 1 mg by mouth at bedtime.     clopidogrel 75 MG tablet  Commonly known as:  PLAVIX  Take 1 tablet (75 mg total) by mouth daily with breakfast.     JENTADUETO 2.5-500 MG Tabs  Generic drug:  Linagliptin-Metformin HCl  Take 1 tablet by mouth daily with supper.     levothyroxine 100 MCG tablet  Commonly known as:  SYNTHROID, LEVOTHROID  Take 100 mcg by mouth daily.     lisinopril 10 MG tablet  Commonly known as:  PRINIVIL,ZESTRIL  Take 1 tablet (10 mg total) by mouth daily.     metoprolol tartrate 25 MG tablet  Commonly known as:  LOPRESSOR  Take 25 mg by mouth daily.     multivitamin with minerals Tabs tablet  Take 1 tablet by mouth daily with lunch.     oxyCODONE-acetaminophen 5-325 MG tablet  Commonly known as:  PERCOCET/ROXICET  Take 1 tablet by mouth every 6 (six) hours as needed for severe pain.     pantoprazole 40 MG tablet  Commonly known as:  PROTONIX  Take 1 tablet (40 mg total) by mouth daily.     potassium chloride 10 MEQ tablet  Commonly known as:  K-DUR,KLOR-CON  Take 1 tablet (10 mEq total) by mouth daily.     tamsulosin 0.4 MG Caps capsule  Commonly known  as:  FLOMAX  Take 1 capsule (0.4 mg total) by mouth daily.     traMADol 50 MG tablet  Commonly known as:  ULTRAM  Take 50 mg by mouth every 6 (six) hours as needed (pain).     Vitamin D3 2000 UNITS Tabs  Take 2,000 Units by mouth daily with lunch.        Duration of Discharge Encounter   Greater than 30 minutes including physician time.  Signed, Inez Stantz PA-C 09/07/2015, 1:41 PM

## 2015-09-07 NOTE — Care Management Note (Addendum)
Case Management Note  Patient Details  Name: Catherine [name] MRN: [id number] Date of Birth: 13-Apr-1941  Subjective/Objective:   Pt admitted is s/p TAVR, pacemaker placement that developed a right pneumothorax subsequently had chest tube inserted 09/02/15.              Action/Plan:  Pt is independent from home with spouse, uses cane at home with ambulation.  Pt also has walker available at home.  Pt may benefit from [facility] post discharge, CM will continue to monitor for disposition needs   Expected Discharge Date:                  Expected Discharge Plan:  [place] (Pt is from home with husband, uses cane for ambulation at home, daughter provides additonal support when needed)  In-House Referral:     Discharge planning Services  CM Consult  Post Acute Care Choice:    Choice offered to:     DME Arranged:    DME Agency:     HH Arranged:    [place] Agency:     Status of Service:  Complete, will sign off  Medicare Important Message Given:  Yes-third notification given Date Medicare IM Given:    Medicare IM give by:    Date Additional Medicare IM Given:    Additional Medicare Important Message give by:     If discussed at [place] of Stay Meetings, dates discussed:  09/07/2015   Additional Comments: 09/07/2015 CM assessed pt prior to discharge; daughter at bedside, stated family will bring pt new walker to home at discharge.  Pt has ambulated well with assistance of walker during admit.  NO CM needs determined.  09/07/2015 Pt continues to have CT, placed to water seal 09/05/15. CM will continue to monitor for disposition needs  09/02/15  CM assessed pt, no CM needs at this time Maryclare Labrador, RN 09/07/2015, 11:23 AM

## 2015-09-07 NOTE — Progress Notes (Signed)
    Subjective:  Feeling much better. Had BM yesterday, urinating without difficulty. No chest pain or shortness of breath.  Objective:  Vital Signs in the last 24 hours: Temp:  [97.9 F (36.6 C)-98.6 F (37 C)] 97.9 F (36.6 C) (10/05 0448) Pulse Rate:  [75-84] 80 (10/05 1040) Resp:  [18] 18 (10/05 0448) BP: (122-141)/(36-46) 123/46 mmHg (10/05 1040) SpO2:  [95 %-98 %] 96 % (10/05 0448) Weight:  [193 lb 9 oz (87.8 kg)] 193 lb 9 oz (87.8 kg) (10/05 0448)  Intake/Output from previous day: 10/04 0701 - 10/05 0700 In: 43 [P.O.:960] Out: 1501 [Urine:1500; Stool:1]  Physical Exam: Pt is alert and oriented, pleasant elderly woman in NAD HEENT: normal Neck: JVP - normal Lungs: CTA bilaterally CV: RRR with soft SEM at the LSB Abd: soft, NT, Positive BS, no hepatomegaly Ext: no C/C/E, distal pulses intact and equal Skin: warm/dry no rash   Lab Results:  Recent Labs  09/07/15 0345  WBC 6.8  HGB 9.1*  PLT 239    Recent Labs  09/07/15 0345  NA 137  K 3.0*  CL 97*  CO2 30  GLUCOSE 107*  BUN 7  CREATININE 0.56   No results for input(s): TROPONINI in the last 72 hours.  Invalid input(s): CK, MB  Assessment/Plan:  1. Critical AS: s/p TAVR, transcatheter heart valve functioning well. On ASA/plavix.  2. Complete heart block s/p PPM: will arrange wound check and pacer clinic visit.  3. Pneumothorax: chest tube out. CXR shows well-expanded lung and pt now asymptomatic.  4. Type II DM: resume home program at discharge.  5. HTN: resume low-dose metoprolol at her home dose  6. Hypokalemia: KCL given this am. Discharge on KCL 10 meq daily and repeat labs in one week.  For FU, she will see Dr Roxy Manns in 2 weeks, me in 4 weeks with an echo. Will also arrange Pacemaker Clinic visit.   Sherren Mocha, M.D. 09/07/2015, 11:22 AM

## 2015-09-14 ENCOUNTER — Other Ambulatory Visit (INDEPENDENT_AMBULATORY_CARE_PROVIDER_SITE_OTHER): Payer: Medicare Other

## 2015-09-14 DIAGNOSIS — I10 Essential (primary) hypertension: Secondary | ICD-10-CM

## 2015-09-14 DIAGNOSIS — E876 Hypokalemia: Secondary | ICD-10-CM

## 2015-09-14 LAB — BASIC METABOLIC PANEL
BUN: 14 mg/dL (ref 7–25)
CALCIUM: 9.9 mg/dL (ref 8.6–10.4)
CO2: 28 mmol/L (ref 20–31)
CREATININE: 0.78 mg/dL (ref 0.60–0.93)
Chloride: 99 mmol/L (ref 98–110)
Glucose, Bld: 112 mg/dL — ABNORMAL HIGH (ref 65–99)
Potassium: 4.6 mmol/L (ref 3.5–5.3)
Sodium: 135 mmol/L (ref 135–146)

## 2015-09-16 ENCOUNTER — Other Ambulatory Visit: Payer: Self-pay | Admitting: Surgery

## 2015-09-16 DIAGNOSIS — Z952 Presence of prosthetic heart valve: Secondary | ICD-10-CM

## 2015-09-19 ENCOUNTER — Ambulatory Visit (INDEPENDENT_AMBULATORY_CARE_PROVIDER_SITE_OTHER): Payer: Medicare Other | Admitting: *Deleted

## 2015-09-19 ENCOUNTER — Ambulatory Visit
Admission: RE | Admit: 2015-09-19 | Discharge: 2015-09-19 | Disposition: A | Discharge status: Home / Self Care | Payer: Medicare Other | Source: Ambulatory Visit | Attending: Surgery | Admitting: Surgery

## 2015-09-19 ENCOUNTER — Ambulatory Visit (INDEPENDENT_AMBULATORY_CARE_PROVIDER_SITE_OTHER): Payer: Medicare Other | Admitting: Surgical

## 2015-09-19 VITALS — BP 117/72 | HR 90 | Resp 16 | Ht 62.5 in | Wt 185.0 lb

## 2015-09-19 DIAGNOSIS — I442 Atrioventricular block, complete: Secondary | ICD-10-CM | POA: Diagnosis not present

## 2015-09-19 DIAGNOSIS — Z95818 Presence of other cardiac implants and grafts: Secondary | ICD-10-CM

## 2015-09-19 DIAGNOSIS — I350 Nonrheumatic aortic (valve) stenosis: Secondary | ICD-10-CM | POA: Diagnosis not present

## 2015-09-19 DIAGNOSIS — Z954 Presence of other heart-valve replacement: Secondary | ICD-10-CM

## 2015-09-19 DIAGNOSIS — Z953 Presence of xenogenic heart valve: Secondary | ICD-10-CM

## 2015-09-19 DIAGNOSIS — Z952 Presence of prosthetic heart valve: Secondary | ICD-10-CM

## 2015-09-19 LAB — CUP PACEART INCLINIC DEVICE CHECK
Battery Remaining Longevity: 123.6
Battery Voltage: 3.02 V
Brady Statistic RA Percent Paced: 0.15 %
Implantable Lead Implant Date: 20160928
Implantable Lead Location: 753859
Lead Channel Impedance Value: 437.5 Ohm
Lead Channel Pacing Threshold Pulse Width: 0.4 ms
Lead Channel Pacing Threshold Pulse Width: 0.4 ms
Lead Channel Sensing Intrinsic Amplitude: 3.3 mV
Lead Channel Setting Pacing Amplitude: 1 V
Lead Channel Setting Pacing Amplitude: 3.5 V
Lead Channel Setting Pacing Pulse Width: 0.4 ms
MDC IDC LEAD IMPLANT DT: 20160928
MDC IDC LEAD LOCATION: 753860
MDC IDC MSMT LEADCHNL RA PACING THRESHOLD AMPLITUDE: 0.75 V
MDC IDC MSMT LEADCHNL RV IMPEDANCE VALUE: 562.5 Ohm
MDC IDC MSMT LEADCHNL RV PACING THRESHOLD AMPLITUDE: 0.75 V
MDC IDC MSMT LEADCHNL RV SENSING INTR AMPL: 8.2 mV
MDC IDC PG SERIAL: 7779295
MDC IDC SESS DTM: 20161017161858
MDC IDC SET LEADCHNL RV SENSING SENSITIVITY: 4 mV
MDC IDC STAT BRADY RV PERCENT PACED: 99.87 %

## 2015-09-19 NOTE — Patient Instructions (Signed)
Increase activities as tolerated.

## 2015-09-19 NOTE — Progress Notes (Signed)
Wound check appointment. Steri-strips removed. Wound without redness or edema. Incision edges approximated, wound well healed. Normal device function. Thresholds, sensing, and impedances consistent with implant measurements. Device programmed at 3.5V/auto capture programmed on for extra safety margin until 3 month visit. Histogram distribution appropriate for patient and level of activity. 6 mode switches (<1%), EGMs suggest AT, +Plavix and ASA 81mg , longest 12sec, peak A 226bpm, peak V 71bpm. No high ventricular rates noted. Patient educated about wound care, arm mobility, lifting restrictions. Patient experiences some stiffness and pain in R shoulder, educated about and encouraged mobility exercises. ROV with WC on 12/19/15 on 2:30pm.

## 2015-09-19 NOTE — Progress Notes (Signed)
[address]       [place] [address]             [phone]                  Eulala C Koon [place] Medical Record #[id number] Date of Birth: [birth year]  Referring JF:[id number], Carloyn Manner, MD Primary Cardiology: Primary Care:MOREIRA,ROY, MD  Chief Complaint:  Follow Up Visit CARDIOTHORACIC SURGERY OPERATIVE NOTE  Date of Procedure:08/30/2015  Preoperative Diagnosis:Severe Aortic Stenosis   Postoperative Diagnosis:Same   Procedure:   Transcatheter Aortic Valve Replacement - Open Right Transfemoral Approach Edwards Sapien 3 Transcatheter Heart Valve (size 26 mm, model # 9600TFX, serial # [id number])  Co-Surgeons:Clarence H. Roxy Manns, MD and Sherren Mocha, MD  Assistants:Wayne Girtha Rm, PA-C  Anesthesiologist:Adam Marcie Bal, MD  Echocardiographer:Katarina Meda Coffee, MD  Pre-operative Echo Findings:  Severe aortic stenosis with moderate aortic insufficiency  Normal left ventricular systolic function  Post-operative Echo Findings:  No paravalvular leak  Normal left ventricular systolic function   History of Present Illness:    The patient is a 74 year old white female status post above procedure seen in the office on today's date for routine follow-up. Overall the patient states that although she is doing fine she has multiple chronic issues which caused discomfort. She complains of headaches, muscle and joint aches, depression and significant insomnia. She denies shortness of breath or chest pain. The incisions are only a minor discomfort. She denies fevers, chills or other constitutional symptoms. She is fairly inactive at this point but does get around some.     Zubrod Score: At the time of surgery this patient's most appropriate activity status/level should be described as: []     0    Normal activity, no symptoms []      1    Restricted in physical strenuous activity but ambulatory, able to do out light work []     2    Ambulatory and capable of self care, unable to do work activities, up and about                 >50 % of waking hours                                                                                   []     3    Only limited self care, in bed greater than 50% of waking hours []     4    Completely disabled, no self care, confined to bed or chair []     5    Moribund  History  Smoking status  . Never Smoker   Smokeless tobacco  . Not on file       Allergies  Allergen Reactions  . Sulfa Antibiotics Hives  . Latex Rash    Current Outpatient Prescriptions  Medication Sig Dispense Refill  . aspirin EC 81 MG tablet Take 81 mg by mouth daily.    Marland Kitchen atorvastatin (LIPITOR) 40 MG tablet Take 20 mg by mouth daily with lunch.    . Cholecalciferol (VITAMIN D3) 2000 UNITS TABS Take 2,000 Units by mouth daily with lunch.     . ciprofloxacin (CIPRO)  500 MG tablet Take 1 tablet (500 mg total) by mouth 2 (two) times daily. 10 tablet 0  . citalopram (CELEXA) 40 MG tablet Take 20 mg by mouth daily with lunch.     . clonazePAM (KLONOPIN) 1 MG tablet Take 1 mg by mouth at bedtime.     . clopidogrel (PLAVIX) 75 MG tablet Take 1 tablet (75 mg total) by mouth daily with breakfast. 30 tablet 11  . levothyroxine (SYNTHROID, LEVOTHROID) 100 MCG tablet Take 100 mcg by mouth daily.    . Linagliptin-Metformin HCl (JENTADUETO) 2.5-500 MG TABS Take 1 tablet by mouth daily with supper.     Marland Kitchen lisinopril (PRINIVIL,ZESTRIL) 10 MG tablet Take 1 tablet (10 mg total) by mouth daily. 30 tablet 6  . metoprolol tartrate (LOPRESSOR) 25 MG tablet Take 25 mg by mouth daily.     . Multiple Vitamin (MULTIVITAMIN WITH MINERALS) TABS tablet Take 1 tablet by mouth daily with lunch.    . oxyCODONE-acetaminophen (PERCOCET/ROXICET) 5-325 MG tablet Take 1 tablet by mouth every 6 (six) hours as needed for severe pain. 30 tablet 0  .  pantoprazole (PROTONIX) 40 MG tablet Take 1 tablet (40 mg total) by mouth daily. 30 tablet 11  . potassium chloride SA (K-DUR,KLOR-CON) 10 MEQ tablet Take 1 tablet (10 mEq total) by mouth daily. 30 tablet 6  . tamsulosin (FLOMAX) 0.4 MG CAPS capsule Take 1 capsule (0.4 mg total) by mouth daily. 30 capsule 3  . traMADol (ULTRAM) 50 MG tablet Take 50 mg by mouth every 6 (six) hours as needed (pain).      No current facility-administered medications for this visit.       Physical Exam: BP 117/72 mmHg  Pulse 90  Resp 16  Ht 5' 2.5" (1.588 m)  Wt 185 lb (83.915 kg)  BMI 33.28 kg/m2  SpO2 98%  General appearance: alert, cooperative, fatigued and no distress Heart: regular rate and rhythm and no rub or murmur Lungs: clear to auscultation bilaterally Abdomen: benign Extremities: trace edema Wound: incis healing well   Diagnostic Studies & Laboratory data:         Recent Radiology Findings: Dg Chest 2 View  09/19/2015  CLINICAL DATA:  Post aortic valve repair EXAM: CHEST  2 VIEW COMPARISON:  09/07/2015 FINDINGS: Right pacer remains in place, unchanged. Changes of aortic valve repair. Lungs are clear. No effusions. Heart is borderline in size. No acute bony abnormality. No pneumothorax. IMPRESSION: No active cardiopulmonary disease. Electronically Signed   By: Rolm Baptise M.D.   On: 09/19/2015 13:13      I have independently reviewed the above radiology findings and reviewed findings  with the patient.  Recent Labs: Lab Results  Component Value Date   WBC 6.8 09/07/2015   HGB 9.1* 09/07/2015   HCT 28.1* 09/07/2015   PLT 239 09/07/2015   GLUCOSE 112* 09/14/2015   ALT 8* 08/25/2015   AST 14* 08/25/2015   NA 135 09/14/2015   K 4.6 09/14/2015   CL 99 09/14/2015   CREATININE 0.78 09/14/2015   BUN 14 09/14/2015   CO2 28 09/14/2015   INR 1.29 08/30/2015   HGBA1C 6.5* 08/25/2015      Assessment / Plan:  Overall the patient is doing fine. There are no significant cardiac  issues . She did have a permanent pacemaker placed during the hospitalization for complete heart block as well as a right-sided chest tube placement for pneumothorax. There are currently no notable issues related to this. I have encouraged her  to increase activities as tolerated and try to minimize sleeping during the day. She will be seen intact for clinic as her next appointment as well as followed up by the electrophysiologist for a pacer check.        GOLD,WAYNE E 09/19/2015 1:56 PM

## 2015-09-24 ENCOUNTER — Emergency Department: Payer: Medicare Other

## 2015-09-24 ENCOUNTER — Observation Stay
Admission: EM | Admit: 2015-09-24 | Discharge: 2015-09-26 | Disposition: A | Discharge status: Home / Self Care | Payer: Medicare Other | Attending: Internal Medicine | Admitting: Internal Medicine

## 2015-09-24 DIAGNOSIS — K589 Irritable bowel syndrome without diarrhea: Secondary | ICD-10-CM | POA: Insufficient documentation

## 2015-09-24 DIAGNOSIS — R071 Chest pain on breathing: Secondary | ICD-10-CM | POA: Diagnosis not present

## 2015-09-24 DIAGNOSIS — R42 Dizziness and giddiness: Secondary | ICD-10-CM | POA: Diagnosis not present

## 2015-09-24 DIAGNOSIS — F419 Anxiety disorder, unspecified: Secondary | ICD-10-CM | POA: Insufficient documentation

## 2015-09-24 DIAGNOSIS — J918 Pleural effusion in other conditions classified elsewhere: Secondary | ICD-10-CM | POA: Insufficient documentation

## 2015-09-24 DIAGNOSIS — R911 Solitary pulmonary nodule: Secondary | ICD-10-CM | POA: Insufficient documentation

## 2015-09-24 DIAGNOSIS — E039 Hypothyroidism, unspecified: Secondary | ICD-10-CM | POA: Diagnosis not present

## 2015-09-24 DIAGNOSIS — Z882 Allergy status to sulfonamides status: Secondary | ICD-10-CM | POA: Diagnosis not present

## 2015-09-24 DIAGNOSIS — I319 Disease of pericardium, unspecified: Secondary | ICD-10-CM | POA: Diagnosis present

## 2015-09-24 DIAGNOSIS — Z801 Family history of malignant neoplasm of trachea, bronchus and lung: Secondary | ICD-10-CM | POA: Diagnosis not present

## 2015-09-24 DIAGNOSIS — E876 Hypokalemia: Secondary | ICD-10-CM | POA: Diagnosis not present

## 2015-09-24 DIAGNOSIS — R0602 Shortness of breath: Secondary | ICD-10-CM | POA: Diagnosis not present

## 2015-09-24 DIAGNOSIS — E119 Type 2 diabetes mellitus without complications: Secondary | ICD-10-CM | POA: Insufficient documentation

## 2015-09-24 DIAGNOSIS — E7800 Pure hypercholesterolemia, unspecified: Secondary | ICD-10-CM | POA: Diagnosis not present

## 2015-09-24 DIAGNOSIS — Z818 Family history of other mental and behavioral disorders: Secondary | ICD-10-CM | POA: Diagnosis not present

## 2015-09-24 DIAGNOSIS — R011 Cardiac murmur, unspecified: Secondary | ICD-10-CM | POA: Diagnosis not present

## 2015-09-24 DIAGNOSIS — Z952 Presence of prosthetic heart valve: Secondary | ICD-10-CM | POA: Diagnosis not present

## 2015-09-24 DIAGNOSIS — Z7982 Long term (current) use of aspirin: Secondary | ICD-10-CM | POA: Diagnosis not present

## 2015-09-24 DIAGNOSIS — I10 Essential (primary) hypertension: Secondary | ICD-10-CM | POA: Diagnosis not present

## 2015-09-24 DIAGNOSIS — I442 Atrioventricular block, complete: Secondary | ICD-10-CM | POA: Diagnosis not present

## 2015-09-24 DIAGNOSIS — G8929 Other chronic pain: Secondary | ICD-10-CM | POA: Insufficient documentation

## 2015-09-24 DIAGNOSIS — Z853 Personal history of malignant neoplasm of breast: Secondary | ICD-10-CM | POA: Diagnosis not present

## 2015-09-24 DIAGNOSIS — J948 Other specified pleural conditions: Secondary | ICD-10-CM | POA: Insufficient documentation

## 2015-09-24 DIAGNOSIS — M797 Fibromyalgia: Secondary | ICD-10-CM | POA: Insufficient documentation

## 2015-09-24 DIAGNOSIS — I350 Nonrheumatic aortic (valve) stenosis: Secondary | ICD-10-CM | POA: Diagnosis not present

## 2015-09-24 DIAGNOSIS — R1010 Upper abdominal pain, unspecified: Secondary | ICD-10-CM | POA: Insufficient documentation

## 2015-09-24 DIAGNOSIS — Z86010 Personal history of colonic polyps: Secondary | ICD-10-CM | POA: Diagnosis not present

## 2015-09-24 DIAGNOSIS — I5033 Acute on chronic diastolic (congestive) heart failure: Secondary | ICD-10-CM | POA: Diagnosis not present

## 2015-09-24 DIAGNOSIS — I2510 Atherosclerotic heart disease of native coronary artery without angina pectoris: Secondary | ICD-10-CM | POA: Diagnosis not present

## 2015-09-24 DIAGNOSIS — F329 Major depressive disorder, single episode, unspecified: Secondary | ICD-10-CM | POA: Insufficient documentation

## 2015-09-24 DIAGNOSIS — R079 Chest pain, unspecified: Secondary | ICD-10-CM | POA: Diagnosis not present

## 2015-09-24 DIAGNOSIS — Z91040 Latex allergy status: Secondary | ICD-10-CM | POA: Insufficient documentation

## 2015-09-24 DIAGNOSIS — Z7902 Long term (current) use of antithrombotics/antiplatelets: Secondary | ICD-10-CM | POA: Insufficient documentation

## 2015-09-24 DIAGNOSIS — D649 Anemia, unspecified: Secondary | ICD-10-CM | POA: Diagnosis not present

## 2015-09-24 DIAGNOSIS — M069 Rheumatoid arthritis, unspecified: Secondary | ICD-10-CM | POA: Insufficient documentation

## 2015-09-24 DIAGNOSIS — Z950 Presence of cardiac pacemaker: Secondary | ICD-10-CM | POA: Insufficient documentation

## 2015-09-24 DIAGNOSIS — Z90710 Acquired absence of both cervix and uterus: Secondary | ICD-10-CM | POA: Diagnosis not present

## 2015-09-24 DIAGNOSIS — I517 Cardiomegaly: Secondary | ICD-10-CM | POA: Diagnosis not present

## 2015-09-24 DIAGNOSIS — R110 Nausea: Secondary | ICD-10-CM | POA: Diagnosis not present

## 2015-09-24 DIAGNOSIS — R10812 Left upper quadrant abdominal tenderness: Secondary | ICD-10-CM | POA: Diagnosis present

## 2015-09-24 DIAGNOSIS — Z794 Long term (current) use of insulin: Secondary | ICD-10-CM | POA: Diagnosis not present

## 2015-09-24 DIAGNOSIS — Z7984 Long term (current) use of oral hypoglycemic drugs: Secondary | ICD-10-CM | POA: Diagnosis not present

## 2015-09-24 DIAGNOSIS — I313 Pericardial effusion (noninflammatory): Secondary | ICD-10-CM | POA: Insufficient documentation

## 2015-09-24 DIAGNOSIS — I3139 Other pericardial effusion (noninflammatory): Secondary | ICD-10-CM

## 2015-09-24 HISTORY — DX: Atherosclerotic heart disease of native coronary artery without angina pectoris: I25.10

## 2015-09-24 LAB — CBC
HEMATOCRIT: 33.1 % — AB (ref 35.0–47.0)
HEMOGLOBIN: 11.1 g/dL — AB (ref 12.0–16.0)
MCH: 27.3 pg (ref 26.0–34.0)
MCHC: 33.5 g/dL (ref 32.0–36.0)
MCV: 81.6 fL (ref 80.0–100.0)
Platelets: 230 10*3/uL (ref 150–440)
RBC: 4.06 MIL/uL (ref 3.80–5.20)
RDW: 15.6 % — ABNORMAL HIGH (ref 11.5–14.5)
WBC: 7.7 10*3/uL (ref 3.6–11.0)

## 2015-09-24 LAB — TROPONIN I: Troponin I: <0.03 ng/mL (ref <0.031)

## 2015-09-24 LAB — COMPREHENSIVE METABOLIC PANEL
ALBUMIN: 3.9 g/dL (ref 3.5–5.0)
ALT: 8 U/L — ABNORMAL LOW (ref 14–54)
ANION GAP: 7 (ref 5–15)
AST: 18 U/L (ref 15–41)
Alkaline Phosphatase: 47 U/L (ref 38–126)
BILIRUBIN TOTAL: 0.7 mg/dL (ref 0.3–1.2)
BUN: 18 mg/dL (ref 6–20)
CALCIUM: 9.4 mg/dL (ref 8.9–10.3)
CO2: 26 mmol/L (ref 22–32)
Chloride: 105 mmol/L (ref 101–111)
Creatinine, Ser: 0.67 mg/dL (ref 0.44–1.00)
GFR calc Af Amer: >60 mL/min (ref >60)
GFR calc non Af Amer: >60 mL/min (ref >60)
GLUCOSE: 140 mg/dL — AB (ref 65–99)
Potassium: 4 mmol/L (ref 3.5–5.1)
Sodium: 138 mmol/L (ref 135–145)
TOTAL PROTEIN: 6.9 g/dL (ref 6.5–8.1)

## 2015-09-24 LAB — GLUCOSE, CAPILLARY
GLUCOSE-CAPILLARY: 126 mg/dL — AB (ref 65–99)
Glucose-Capillary: 179 mg/dL — ABNORMAL HIGH (ref 65–99)

## 2015-09-24 LAB — LIPASE, BLOOD: Lipase: 29 U/L (ref 11–51)

## 2015-09-24 MED ORDER — LISINOPRIL 10 MG PO TABS
10.0000 mg | ORAL_TABLET | Freq: Every day | ORAL | Status: DC
  Administered 2015-09-25 – 2015-09-26 (×2): 10 mg via ORAL
  Filled 2015-09-24 (×2): qty 1

## 2015-09-24 MED ORDER — PANTOPRAZOLE SODIUM 40 MG PO TBEC
40.0000 mg | DELAYED_RELEASE_TABLET | Freq: Every day | ORAL | Status: DC
  Administered 2015-09-25 – 2015-09-26 (×2): 40 mg via ORAL
  Filled 2015-09-24 (×2): qty 1

## 2015-09-24 MED ORDER — VITAMIN D 1000 UNITS PO TABS
2000.0000 [IU] | ORAL_TABLET | ORAL | Status: DC
  Administered 2015-09-25 – 2015-09-26 (×2): 2000 [IU] via ORAL
  Filled 2015-09-24 (×2): qty 2

## 2015-09-24 MED ORDER — ALPRAZOLAM 0.5 MG PO TABS
0.5000 mg | ORAL_TABLET | Freq: Every evening | ORAL | Status: DC | PRN
Start: 2015-09-24 — End: 2015-09-26
  Administered 2015-09-25: 0.5 mg via ORAL
  Filled 2015-09-24: qty 1

## 2015-09-24 MED ORDER — SODIUM CHLORIDE 0.9 % IJ SOLN
3.0000 mL | Freq: Two times a day (BID) | INTRAMUSCULAR | Status: DC
  Administered 2015-09-24 – 2015-09-25 (×3): 3 mL via INTRAVENOUS

## 2015-09-24 MED ORDER — MORPHINE SULFATE (PF) 2 MG/ML IV SOLN
2.0000 mg | Freq: Once | INTRAVENOUS | Status: AC
  Administered 2015-09-24: 2 mg via INTRAVENOUS
  Filled 2015-09-24: qty 1

## 2015-09-24 MED ORDER — LEVOTHYROXINE SODIUM 100 MCG PO TABS
100.0000 ug | ORAL_TABLET | Freq: Every day | ORAL | Status: DC
  Administered 2015-09-25 – 2015-09-26 (×2): 100 ug via ORAL
  Filled 2015-09-24 (×2): qty 1

## 2015-09-24 MED ORDER — INSULIN ASPART 100 UNIT/ML ~~LOC~~ SOLN
0.0000 [IU] | Freq: Three times a day (TID) | SUBCUTANEOUS | Status: DC
Start: 2015-09-24 — End: 2015-09-26
  Administered 2015-09-24: 2 [IU] via SUBCUTANEOUS
  Administered 2015-09-25 – 2015-09-26 (×3): 1 [IU] via SUBCUTANEOUS
  Filled 2015-09-24 (×3): qty 1
  Filled 2015-09-24: qty 2

## 2015-09-24 MED ORDER — ACETAMINOPHEN 650 MG RE SUPP: 650.0000 mg | Freq: Four times a day (QID) | RECTAL | Status: DC | PRN

## 2015-09-24 MED ORDER — TAMSULOSIN HCL 0.4 MG PO CAPS
0.4000 mg | ORAL_CAPSULE | Freq: Every day | ORAL | Status: DC
  Administered 2015-09-24 – 2015-09-26 (×3): 0.4 mg via ORAL
  Filled 2015-09-24 (×3): qty 1

## 2015-09-24 MED ORDER — LINAGLIPTIN 5 MG PO TABS
5.0000 mg | ORAL_TABLET | Freq: Every day | ORAL | Status: DC
  Administered 2015-09-25: 5 mg via ORAL
  Filled 2015-09-24: qty 1

## 2015-09-24 MED ORDER — IOHEXOL 350 MG/ML SOLN
100.0000 mL | Freq: Once | INTRAVENOUS | Status: AC | PRN
  Administered 2015-09-24: 100 mL via INTRAVENOUS

## 2015-09-24 MED ORDER — SENNOSIDES-DOCUSATE SODIUM 8.6-50 MG PO TABS
1.0000 | ORAL_TABLET | Freq: Every evening | ORAL | Status: DC | PRN
Start: 2015-09-24 — End: 2015-09-26

## 2015-09-24 MED ORDER — CLONAZEPAM 1 MG PO TABS
1.0000 mg | ORAL_TABLET | Freq: Every day | ORAL | Status: DC
  Administered 2015-09-24 – 2015-09-25 (×2): 1 mg via ORAL
  Filled 2015-09-24 (×2): qty 1

## 2015-09-24 MED ORDER — ONDANSETRON HCL 4 MG/2ML IJ SOLN: 4.0000 mg | Freq: Four times a day (QID) | INTRAMUSCULAR | Status: DC | PRN

## 2015-09-24 MED ORDER — ONDANSETRON HCL 4 MG PO TABS: 4.0000 mg | ORAL_TABLET | Freq: Four times a day (QID) | ORAL | Status: DC | PRN

## 2015-09-24 MED ORDER — ATORVASTATIN CALCIUM 20 MG PO TABS
20.0000 mg | ORAL_TABLET | Freq: Every day | ORAL | Status: DC
  Administered 2015-09-25: 20 mg via ORAL
  Filled 2015-09-24: qty 1

## 2015-09-24 MED ORDER — ACETAMINOPHEN 325 MG PO TABS
650.0000 mg | ORAL_TABLET | Freq: Four times a day (QID) | ORAL | Status: DC | PRN
  Administered 2015-09-24 – 2015-09-26 (×6): 650 mg via ORAL
  Filled 2015-09-24 (×6): qty 2

## 2015-09-24 MED ORDER — MORPHINE SULFATE (PF) 2 MG/ML IV SOLN: 2.0000 mg | INTRAVENOUS | Status: DC | PRN

## 2015-09-24 MED ORDER — POTASSIUM CHLORIDE CRYS ER 10 MEQ PO TBCR
10.0000 meq | EXTENDED_RELEASE_TABLET | Freq: Every day | ORAL | Status: DC
  Administered 2015-09-25: 10 meq via ORAL
  Filled 2015-09-24: qty 1

## 2015-09-24 MED ORDER — TRAMADOL HCL 50 MG PO TABS: 50.0000 mg | ORAL_TABLET | Freq: Four times a day (QID) | ORAL | Status: DC | PRN

## 2015-09-24 MED ORDER — IOHEXOL 240 MG/ML SOLN
25.0000 mL | Freq: Once | INTRAMUSCULAR | Status: AC | PRN
  Administered 2015-09-24: 25 mL via ORAL

## 2015-09-24 MED ORDER — COLCHICINE 0.6 MG PO TABS
0.6000 mg | ORAL_TABLET | ORAL | Status: AC
  Administered 2015-09-24: 0.6 mg via ORAL
  Filled 2015-09-24: qty 1

## 2015-09-24 MED ORDER — INSULIN ASPART 100 UNIT/ML ~~LOC~~ SOLN
0.0000 [IU] | Freq: Every day | SUBCUTANEOUS | Status: DC
Start: 2015-09-24 — End: 2015-09-26

## 2015-09-24 MED ORDER — ADULT MULTIVITAMIN W/MINERALS CH
1.0000 | ORAL_TABLET | Freq: Every day | ORAL | Status: DC
  Administered 2015-09-25: 1 via ORAL
  Filled 2015-09-24: qty 1

## 2015-09-24 MED ORDER — CLOPIDOGREL BISULFATE 75 MG PO TABS
75.0000 mg | ORAL_TABLET | Freq: Every day | ORAL | Status: DC
  Administered 2015-09-25 – 2015-09-26 (×2): 75 mg via ORAL
  Filled 2015-09-24 (×2): qty 1

## 2015-09-24 MED ORDER — ASPIRIN EC 81 MG PO TBEC
81.0000 mg | DELAYED_RELEASE_TABLET | Freq: Every day | ORAL | Status: DC
  Administered 2015-09-25 – 2015-09-26 (×2): 81 mg via ORAL
  Filled 2015-09-24 (×2): qty 1

## 2015-09-24 MED ORDER — METOPROLOL TARTRATE 25 MG PO TABS
25.0000 mg | ORAL_TABLET | Freq: Every day | ORAL | Status: DC
  Administered 2015-09-25 – 2015-09-26 (×2): 25 mg via ORAL
  Filled 2015-09-24 (×2): qty 1

## 2015-09-24 MED ORDER — CITALOPRAM HYDROBROMIDE 20 MG PO TABS
20.0000 mg | ORAL_TABLET | Freq: Every day | ORAL | Status: DC
  Administered 2015-09-25: 20 mg via ORAL
  Filled 2015-09-24: qty 1

## 2015-09-24 MED ORDER — METFORMIN HCL 500 MG PO TABS
250.0000 mg | ORAL_TABLET | Freq: Every day | ORAL | Status: DC
  Filled 2015-09-24: qty 1

## 2015-09-24 MED ORDER — LINAGLIPTIN-METFORMIN HCL 2.5-500 MG PO TABS: 0.5000 | ORAL_TABLET | Freq: Every day | ORAL | Status: DC

## 2015-09-24 NOTE — ED Notes (Signed)
Pt reports chest pain and abd pain that started 0800. States she just a recent pacemaker and valve replacement done last month. Denies fever. Reports nausea denies diarrhea or vomiting.

## 2015-09-24 NOTE — Progress Notes (Signed)
Patient alert and oriented. Tolerating ra. Telemetry reading av paced. C/o head and abdominal pain, medicated with tylenol per patients request. Skin verified by second nurse [place]

## 2015-09-24 NOTE — ED Provider Notes (Signed)
[facility] Emergency Department Provider Note REMINDER - THIS NOTE IS NOT A FINAL MEDICAL RECORD UNTIL IT IS SIGNED. UNTIL THEN, THE CONTENT BELOW MAY REFLECT INFORMATION FROM A DOCUMENTATION TEMPLATE, NOT THE ACTUAL PATIENT VISIT. ____________________________________________  Time seen: Approximately 10:15 AM  I have reviewed the triage vital signs and the nursing notes.   HISTORY  Chief Complaint Chest Pain and Abdominal Pain    HPI Catherine [name] is a 74 y.o. female Catherine [name] of rather sudden onset of left-sided chest pain, hard to describe it somewhat sharp somewhat pressure in the left lower chest and also some in the left upper abdomen with associated nausea. The patient reports she had a normal bowel movement and no old urination this morning, then while at rest developed nausea and left-sided chest pain that does not radiate to the back arm or neck.  She is had recent cardiac surgery including cardiac pacemaker implantation at [facility]. She also reports that a previous history of bowel surgery.  No vomiting, reports an achy pain in the left upper abdomen but primarily shoots to the left chest. No fevers or chills. She has been recently having some "depression", but states is been ongoing for weeks and she is been working with and informed her cardiology doctor.   Past Medical History  Diagnosis Date  . Diabetes mellitus type II   . Thyroid disease   . IBS (irritable bowel syndrome)   . Anemia   . Colon polyps   . Fibromyalgia   . Rheumatoid arthritis(714.0)   . HTN (hypertension)   . Hypercholesteremia   . Acute colitis   . Rectal bleeding   . Stenosis of aortic valve   . Depression   . Shortness of breath dyspnea   . Cancer (HCC)     breast  . Hypothyroidism   . Chronic diastolic congestive heart failure ([place])   . S/P TAVR (transcatheter aortic valve replacement) 08/30/2015    26 mm Edwards Sapien 3 transcatheter heart valve placed via open  right transfemoral approach  . CHB (complete heart block) [facility]) 08/31/2015    St Jude Medical Assurity DR model QJ1941 (serial number [id number] )     Patient Active Problem List   Diagnosis Date Noted  . Hypokalemia 09/07/2015  . IBS (irritable bowel syndrome)   . Cancer ([place])   . Pneumothorax 09/05/2015  . Complete heart block ([place])   . S/P TAVR (transcatheter aortic valve replacement) 08/30/2015  . Type II diabetes mellitus ([place])   . Essential hypertension   . Chronic diastolic congestive heart failure ([place])   . Rheumatoid arthritis ([place])   . Hypothyroidism   . Fibromyalgia   . Aortic valve stenosis, critical 08/22/2015  . MDD (major depressive disorder) ([place]) 05/26/2012    Past Surgical History  Procedure Laterality Date  . Self inflicted chest wound    . Polyp     . Total abdominal hysterectomy    . Cesarean section      x 2  . Left oophorectomy    . Abdominal surgery      Intestinal surgery  . Mastectomy Left   . Spine surgery  1981    lower back  . Transcatheter aortic valve replacement, transfemoral N/A 08/30/2015    Procedure: TRANSCATHETER AORTIC VALVE REPLACEMENT, TRANSFEMORAL;  Surgeon: Sherren Mocha, MD;  Location: [facility];  Service: Open Heart Surgery;  Laterality: N/A;  . Tee without cardioversion N/A 08/30/2015    Procedure: TRANSESOPHAGEAL ECHOCARDIOGRAM (TEE);  Surgeon:  Sherren Mocha, MD;  Location: [facility];  Service: Open Heart Surgery;  Laterality: N/A;  . Ep implantable device N/A 08/31/2015    Procedure: Pacemaker Implant;  Surgeon: Will Meredith Leeds, MD; [place] (serial number [id number] ); Laterality: Right    Current Outpatient Rx  Name  Route  Sig  Dispense  Refill  . aspirin EC 81 MG tablet   Oral   Take 81 mg by mouth daily.         Marland Kitchen atorvastatin (LIPITOR) 40 MG tablet   Oral   Take 20 mg by mouth daily with lunch.         . Cholecalciferol (VITAMIN D3) 2000 UNITS TABS   Oral   Take 2,000 Units by  mouth daily with lunch.          . ciprofloxacin (CIPRO) 500 MG tablet   Oral   Take 1 tablet (500 mg total) by mouth 2 (two) times daily.   10 tablet   0   . citalopram (CELEXA) 40 MG tablet   Oral   Take 20 mg by mouth daily with lunch.          . clonazePAM (KLONOPIN) 1 MG tablet   Oral   Take 1 mg by mouth at bedtime.          . clopidogrel (PLAVIX) 75 MG tablet   Oral   Take 1 tablet (75 mg total) by mouth daily with breakfast.   30 tablet   11   . levothyroxine (SYNTHROID, LEVOTHROID) 100 MCG tablet   Oral   Take 100 mcg by mouth daily.         . Linagliptin-Metformin HCl (JENTADUETO) 2.5-500 MG TABS   Oral   Take 1 tablet by mouth daily with supper.          Marland Kitchen lisinopril (PRINIVIL,ZESTRIL) 10 MG tablet   Oral   Take 1 tablet (10 mg total) by mouth daily.   30 tablet   6   . metoprolol tartrate (LOPRESSOR) 25 MG tablet   Oral   Take 25 mg by mouth daily.          . Multiple Vitamin (MULTIVITAMIN WITH MINERALS) TABS tablet   Oral   Take 1 tablet by mouth daily with lunch.         . oxyCODONE-acetaminophen (PERCOCET/ROXICET) 5-325 MG tablet   Oral   Take 1 tablet by mouth every 6 (six) hours as needed for severe pain.   30 tablet   0   . pantoprazole (PROTONIX) 40 MG tablet   Oral   Take 1 tablet (40 mg total) by mouth daily.   30 tablet   11   . potassium chloride SA (K-DUR,KLOR-CON) 10 MEQ tablet   Oral   Take 1 tablet (10 mEq total) by mouth daily.   30 tablet   6   . tamsulosin (FLOMAX) 0.4 MG CAPS capsule   Oral   Take 1 capsule (0.4 mg total) by mouth daily.   30 capsule   3   . traMADol (ULTRAM) 50 MG tablet   Oral   Take 50 mg by mouth every 6 (six) hours as needed (pain).            Allergies Sulfa antibiotics and Latex  Family History  Problem Relation Age of Onset  . Depression Sister   . Depression Sister   . Depression Sister   . Lung cancer Sister  Social History Social History  Substance Use  Topics  . Smoking status: Never Smoker   . Smokeless tobacco: Not on file  . Alcohol Use: No    Review of Systems Constitutional: No fever/chills Eyes: No visual changes. ENT: No sore throat. Cardiovascular: See history of present illness Respiratory: Denies shortness of breath. Gastrointestinal: no vomiting.  No diarrhea.  No constipation. Genitourinary: Negative for dysuria. Musculoskeletal: Negative for back pain. Skin: Negative for rash. Neurological: Negative for headaches, focal weakness or numbness.  10-point ROS otherwise negative.  ____________________________________________   PHYSICAL EXAM:  VITAL SIGNS: ED Triage Vitals  Enc Vitals Group     BP 09/24/15 0959 145/56 mmHg     Pulse Rate 09/24/15 0959 74     Resp --      Temp --      Temp src --      SpO2 09/24/15 0959 98 %     Weight 09/24/15 0952 181 lb (82.101 kg)     Height 09/24/15 0952 5\' 2"  (1.575 m)     Head Cir --      Peak Flow --      Pain Score 09/24/15 0949 8     Pain Loc --      Pain Edu? --      Excl. in [place]? --    Constitutional: Alert and oriented. Well appearing and in no acute distress. Eyes: Conjunctivae are normal. PERRL. EOMI. Head: Atraumatic. Nose: No congestion/rhinnorhea. Mouth/Throat: Mucous membranes are moist.  Oropharynx non-erythematous. Neck: No stridor.   Cardiovascular: Normal rate, regular rhythm. Grossly normal heart sounds.  Good peripheral circulation. CDI pacer and old chest tube site on right. Respiratory: Normal respiratory effort.  No retractions. Lungs CTAB. Gastrointestinal: Soft and nontender. No distention. No abdominal bruits. No CVA tenderness. Musculoskeletal: No joint effusions. Neurologic:  Normal speech and language. No gross focal neurologic deficits are appreciated. No gait instability. Skin:  Skin is warm, dry and intact. No rash noted. Psychiatric: Mood and affect are normal. Speech and behavior are  normal.  ____________________________________________   LABS (all labs ordered are listed, but only abnormal results are displayed)  Labs Reviewed  CBC - Abnormal; Notable for the following:    Hemoglobin 11.1 (*)    HCT 33.1 (*)    RDW 15.6 (*)    All other components within normal limits  COMPREHENSIVE METABOLIC PANEL - Abnormal; Notable for the following:    Glucose, Bld 140 (*)    ALT 8 (*)    All other components within normal limits  TROPONIN I  LIPASE, BLOOD   ____________________________________________  EKG  Reviewed and interpreted by me at 9:55 AM Ventricular rate 75 QRS 180 QTc 510 Reviewed and interpreted as ventricular paced rhythm, no obvious ischemia however in the setting of ventricular paced rhythm unable to exclude. ____________________________________________  RADIOLOGY  IMPRESSION: 1. No evidence of pulmonary emboli. 2. New, small left pleural effusion with attenuation suggestive of hemorrhage or proteinaceous material. 3. No acute abnormality identified in the abdomen or pelvis.  Please see radiology report, radiologist is reading a high density material with a small new pericardial effusion. ____________________________________________   PROCEDURES  Procedure(s) performed: None  Critical Care performed: No  ____________________________________________   INITIAL IMPRESSION / ASSESSMENT AND PLAN / ED COURSE  Pertinent labs & imaging results that were available during my care of the patient were reviewed by me and considered in my medical decision making (see chart for details).    D/W Dr. Ellyn Hack,  advises that CAD very unlikely given recent cath and echo. Advises that based on presentation it would seem extremely unlikely that this is associated complication from aortic valve replacement. Advises traditional evaluation including troponins, chest x-ray, and ongoing workup. In the setting of the patient's recent admission, pneumothorax,  and surgery, left-sided chest pain will certainly consider acute coronary syndrome though seemingly unlikely given recent negative catheter, also consider referred pain from the left upper abdomen in the abdominal exam is quite benign without associated vomiting fevers or other abdominal symptoms. She did have a normal bowel movement this morning. We will also consider pulmonary embolism, far less likely would be couple occasions such as dissection. I anticipate obtaining a CT angiography of the chest to exclude pulmonary wasn't given her recent surgical course.  ----------------------------------------- 1:47 PM on 09/24/2015 -----------------------------------------  Patient reports her chest pain is slowly coming back, but she is in no distress. Stable. Discussed with Dr. Ellyn Hack and reviewed CT scan, does appear the patient may have a high density albeit small pericardial effusion and question if this could represent complication from surgery versus pericarditis. After much discussion, we advised the patient what seems a reasonable plan is admission for chest pain exclusion, repeat echo and cardiology consultation in the morning. Patient agreeable with plan. Dr. Ellyn Hack advises file colchicine for possible pericarditis. ____________________________________________   FINAL CLINICAL IMPRESSION(S) / ED DIAGNOSES  Final diagnoses:  Left upper quadrant abdominal tenderness  Left sided chest pain      Delman Kitten, MD 09/24/15 1348

## 2015-09-24 NOTE — H&P (Signed)
[facility] [place] NAME: Catherine [name]    MR#:  [id number]  DATE OF BIRTH:  Nov 01, 1941  DATE OF ADMISSION:  09/24/2015  PRIMARY CARE PHYSICIAN: Jilda Panda, MD   REQUESTING/REFERRING PHYSICIAN: Dr. Delman Kitten  CHIEF COMPLAINT:   Chief Complaint  Patient presents with  . Chest Pain  . Abdominal Pain    HISTORY OF PRESENT ILLNESS:  Catherine [name]  is a 74 y.o. female with a known history of diabetes, hypothyroidism, rheumatoid arthritis, fibromyalgia and chronic pain, severe aortic stenosis Status post transcatheter aortic valve replacement about 3 weeks ago, postoperatively patient had complications of complete heart block requiring a dual-chamber pacemaker, right-sided pneumothorax requiring of chest tube placement at [facility], discharged home about 2 weeks ago presents to the hospital secondary to chest pain. Preoperative cardiac catheterization showed only 70% LAD lesion and no significant obstructive cardiac disease. She just saw her cardiothoracic surgeon last week and no changes were recommended. Patient says she was feeling fine until this morning when she woke up and started experiencing abdominal pain. Patient was not able to describe the pain exactly, she said it felt-like cramping in her abdomen that slowly spread onto the left side of her chest. Since then the pain in the chest has been constant. No nausea vomiting or diaphoresis no fever or chills. CT of the chest here shows a small left pleural effusion and a small pericardial effusion. Patient is being admitted for observation.  PAST MEDICAL HISTORY:   Past Medical History  Diagnosis Date  . Diabetes mellitus type II   . Thyroid disease   . IBS (irritable bowel syndrome)   . Anemia   . Colon polyps   . Fibromyalgia   . Rheumatoid arthritis(714.0)   . HTN (hypertension)   . Hypercholesteremia   . Acute colitis   . Rectal bleeding   . Stenosis of aortic  valve   . Depression   . Shortness of breath dyspnea   . Cancer (HCC)     breast  . Hypothyroidism   . Chronic diastolic congestive heart failure ([place])   . S/P TAVR (transcatheter aortic valve replacement) 08/30/2015    26 mm Edwards Sapien 3 transcatheter heart valve placed via open right transfemoral approach  . CHB (complete heart block) ([place]) 08/31/2015    St Jude Medical Assurity DR model [id number] (serial number [id number] )     PAST SURGICAL HISTORY:   Past Surgical History  Procedure Laterality Date  . Self inflicted chest wound    . Polyp     . Total abdominal hysterectomy    . Cesarean section      x 2  . Left oophorectomy    . Abdominal surgery      Intestinal surgery  . Mastectomy Left   . Spine surgery  1981    lower back  . Transcatheter aortic valve replacement, transfemoral N/A 08/30/2015    Procedure: TRANSCATHETER AORTIC VALVE REPLACEMENT, TRANSFEMORAL;  Surgeon: Sherren Mocha, MD;  Location: [facility];  Service: Open Heart Surgery;  Laterality: N/A;  . Tee without cardioversion N/A 08/30/2015    Procedure: TRANSESOPHAGEAL ECHOCARDIOGRAM (TEE);  Surgeon: Sherren Mocha, MD;  Location: [facility];  Service: Open Heart Surgery;  Laterality: N/A;  . Ep implantable device N/A 08/31/2015    Procedure: Pacemaker Implant;  Surgeon: Will Meredith Leeds, MD; [place] (serial number [id number] ); Laterality: Right    SOCIAL HISTORY:  Social History  Substance Use Topics  . Smoking status: Never Smoker   . Smokeless tobacco: Not on file  . Alcohol Use: No    FAMILY HISTORY:   Family History  Problem Relation Age of Onset  . Depression Sister   . Depression Sister   . Depression Sister   . Lung cancer Sister     DRUG ALLERGIES:   Allergies  Allergen Reactions  . Sulfa Antibiotics Hives  . Latex Rash    REVIEW OF SYSTEMS:   Review of Systems  Constitutional: Negative for fever, chills, weight loss and malaise/fatigue.  HENT:  Negative for ear discharge, ear pain, hearing loss, nosebleeds and tinnitus.   Eyes: Negative for blurred vision, double vision and photophobia.  Respiratory: Negative for cough, hemoptysis, shortness of breath and wheezing.   Cardiovascular: Positive for chest pain. Negative for palpitations, orthopnea and leg swelling.  Gastrointestinal: Positive for abdominal pain. Negative for heartburn, nausea, vomiting, diarrhea, constipation and melena.  Genitourinary: Negative for dysuria, urgency, frequency and hematuria.  Musculoskeletal: Negative for myalgias, back pain and neck pain.  Skin: Negative for rash.  Neurological: Negative for dizziness, tingling, tremors, sensory change, speech change, focal weakness and headaches.  Endo/Heme/Allergies: Does not bruise/bleed easily.  Psychiatric/Behavioral: Negative for depression.    MEDICATIONS AT HOME:   Prior to Admission medications   Medication Sig Start Date End Date Taking? Authorizing Provider  aspirin EC 81 MG tablet Take 81 mg by mouth daily.   Yes Historical Provider, MD  atorvastatin (LIPITOR) 40 MG tablet Take 20 mg by mouth daily with lunch.   Yes Historical Provider, MD  Cholecalciferol (VITAMIN D3) 2000 UNITS TABS Take 2,000 Units by mouth every morning.    Yes Historical Provider, MD  citalopram (CELEXA) 40 MG tablet Take 20 mg by mouth daily with lunch.    Yes Historical Provider, MD  clonazePAM (KLONOPIN) 1 MG tablet Take 1 mg by mouth at bedtime.    Yes Historical Provider, MD  clopidogrel (PLAVIX) 75 MG tablet Take 1 tablet (75 mg total) by mouth daily with breakfast. 09/07/15  Yes Bhavinkumar Bhagat, PA  levothyroxine (SYNTHROID, LEVOTHROID) 100 MCG tablet Take 100 mcg by mouth daily.   Yes Historical Provider, MD  Linagliptin-Metformin HCl (JENTADUETO) 2.5-500 MG TABS Take 0.5 tablets by mouth daily with supper.    Yes Historical Provider, MD  lisinopril (PRINIVIL,ZESTRIL) 10 MG tablet Take 1 tablet (10 mg total) by mouth daily.  09/07/15  Yes Bhavinkumar Bhagat, PA  metoprolol tartrate (LOPRESSOR) 25 MG tablet Take 25 mg by mouth daily.    Yes Historical Provider, MD  Multiple Vitamin (MULTIVITAMIN WITH MINERALS) TABS tablet Take 1 tablet by mouth daily with lunch.   Yes Historical Provider, MD  pantoprazole (PROTONIX) 40 MG tablet Take 1 tablet (40 mg total) by mouth daily. 09/07/15  Yes Bhavinkumar Bhagat, PA  potassium chloride SA (K-DUR,KLOR-CON) 10 MEQ tablet Take 1 tablet (10 mEq total) by mouth daily. 09/07/15  Yes Bhavinkumar Bhagat, PA  tamsulosin (FLOMAX) 0.4 MG CAPS capsule Take 1 capsule (0.4 mg total) by mouth daily. 09/07/15  Yes Bhavinkumar Bhagat, PA  traMADol (ULTRAM) 50 MG tablet Take 50 mg by mouth every 6 (six) hours as needed (pain).    Yes Historical Provider, MD  ciprofloxacin (CIPRO) 500 MG tablet Take 1 tablet (500 mg total) by mouth 2 (two) times daily. 09/07/15   Bhavinkumar Bhagat, PA  oxyCODONE-acetaminophen (PERCOCET/ROXICET) 5-325 MG tablet Take 1 tablet by mouth every 6 (six) hours as needed  for severe pain. 09/07/15   Bhavinkumar Bhagat, PA      VITAL SIGNS:  Blood pressure 122/64, pulse 60, resp. rate 22, height 5\' 2"  (1.575 m), weight 82.101 kg (181 lb), SpO2 99 %.  PHYSICAL EXAMINATION:   Physical Exam  GENERAL:  74 y.o.-year-old patient lying in the bed with no acute distress.  EYES: Pupils equal, round, reactive to light and accommodation. No scleral icterus. Extraocular muscles intact.  HEENT: Head atraumatic, normocephalic. Oropharynx and nasopharynx clear.  NECK:  Supple, no jugular venous distention. No thyroid enlargement, no tenderness.  LUNGS: Normal breath sounds bilaterally, no wheezing, rhonchi. Left basilar crackles heard. No use of accessory muscles of respiration.  CARDIOVASCULAR: S1, S2 normal. No  rubs, or gallops. 3/6 systolic murmur present. ABDOMEN: Soft, nontender, nondistended. Bowel sounds present. No organomegaly or mass.  EXTREMITIES: No pedal edema, cyanosis,  or clubbing.  NEUROLOGIC: Cranial nerves II through XII are intact. Muscle strength 5/5 in all extremities. Sensation intact. Gait not checked.  PSYCHIATRIC: The patient is alert and oriented x 3.  SKIN: No obvious rash, lesion, or ulcer.   LABORATORY PANEL:   CBC  Recent Labs Lab 09/24/15 1040  WBC 7.7  HGB 11.1*  HCT 33.1*  PLT 230   ------------------------------------------------------------------------------------------------------------------  Chemistries   Recent Labs Lab 09/24/15 1040  NA 138  K 4.0  CL 105  CO2 26  GLUCOSE 140*  BUN 18  CREATININE 0.67  CALCIUM 9.4  AST 18  ALT 8*  ALKPHOS 75  BILITOT 0.7   ------------------------------------------------------------------------------------------------------------------  Cardiac Enzymes  Recent Labs Lab 09/24/15 1040  TROPONINI <0.03   ------------------------------------------------------------------------------------------------------------------  RADIOLOGY:  Dg Chest 1 View  09/24/2015  CLINICAL DATA:  Chest and abdominal pain beginning at 0800 hours, recent pacemaker placement and valve replacement last month, hypertension, diabetes mellitus EXAM: CHEST 1 VIEW COMPARISON:  Portable exam 1030 hours compared 09/19/2015 FINDINGS: RIGHT subclavian pacemaker with leads projecting over RIGHT atrium and RIGHT ventricle. Enlargement of cardiac silhouette. Atherosclerotic calcification aorta. Mediastinal contours and pulmonary vascularity normal. Lungs hyperinflated but clear. No pulmonary infiltrate, pleural effusion, or pneumothorax. Diffuse osseous demineralization. IMPRESSION: Enlargement of cardiac silhouette post pacemaker. No acute abnormalities. Electronically Signed   By: Lavonia Dana M.D.   On: 09/24/2015 10:58   Ct Angio Chest Pe W/cm &/or Wo Cm  09/24/2015  ADDENDUM REPORT: 09/24/2015 13:55 ADDENDUM: Impression #2 should read: "New, small pericardial effusion with attenuation suggestive of  hemorrhage or proteinaceous material." Electronically Signed   By: Logan Bores M.D.   On: 09/24/2015 13:55  09/24/2015  CLINICAL DATA:  Left chest pain and left upper abdominal pain. Nausea. Pacemaker placement and transcatheter aortic valve replacement last month. EXAM: CT ANGIOGRAPHY CHEST CT ABDOMEN AND PELVIS WITH CONTRAST TECHNIQUE: Multidetector CT imaging of the chest was performed using the standard protocol during bolus administration of intravenous contrast. Multiplanar CT image reconstructions and MIPs were obtained to evaluate the vascular anatomy. Multidetector CT imaging of the abdomen and pelvis was performed using the standard protocol during bolus administration of intravenous contrast. CONTRAST:  39mL OMNIPAQUE IOHEXOL 240 MG/ML SOLN, 165mL OMNIPAQUE IOHEXOL 350 MG/ML SOLN COMPARISON:  08/17/2015 CTA chest, abdomen, and pelvis FINDINGS: CTA CHEST FINDINGS There is no evidence of pulmonary emboli. The thoracic aorta is normal in caliber with moderate calcified atherosclerosis noted. Sequelae of interval transcatheter aortic valve replacement are identified. Rate subclavian approach dual lead pacemaker has also been placed with leads terminating in the right atrium and right ventricle. There is a  small pericardial effusion which is high in density. Coronary artery calcification is noted. Heart is mildly enlarged. There is no pleural effusion. Dependent subsegmental atelectasis is present in both lower lobes, and this obscures the previously described 3 mm left lower lobe nodule. There is evidence of dish involving the visualized lower cervical and thoracic spine. CT ABDOMEN and PELVIS FINDINGS The liver, gallbladder, spleen, adrenal glands, left kidney, and pancreas have an unremarkable enhanced appearance. Subcentimeter low-density lesion in the upper pole of the right kidney is unchanged and too small to fully characterize but most likely represents a cyst. 1.4 cm high attenuation lesion in the  lower pole the right kidney is unchanged and may represent a proteinaceous or hemorrhagic cyst. Oral contrast is present in multiple loops of nondilated small bowel. There is no evidence of bowel obstruction or bowel wall thickening. Bladder is unremarkable. Uterus is absent. Right ovary is under. Left ovary is again not clearly identified. Relatively diffuse aortoiliac atherosclerotic calcification is again seen. No free fluid or enlarged lymph nodes are identified. Postsurgical changes are noted in the right inguinal region with subcutaneous soft tissue stranding anterior to the femoral vessels and surgical clips. Lumbar spondylosis is noted. Review of the MIP images confirms the above findings. IMPRESSION: 1. No evidence of pulmonary emboli. 2. New, small left pleural effusion with attenuation suggestive of hemorrhage or proteinaceous material. 3. No acute abnormality identified in the abdomen or pelvis. Electronically Signed: By: Logan Bores M.D. On: 09/24/2015 12:11   Ct Abdomen Pelvis W Contrast  09/24/2015  ADDENDUM REPORT: 09/24/2015 13:55 ADDENDUM: Impression #2 should read: "New, small pericardial effusion with attenuation suggestive of hemorrhage or proteinaceous material." Electronically Signed   By: Logan Bores M.D.   On: 09/24/2015 13:55  09/24/2015  CLINICAL DATA:  Left chest pain and left upper abdominal pain. Nausea. Pacemaker placement and transcatheter aortic valve replacement last month. EXAM: CT ANGIOGRAPHY CHEST CT ABDOMEN AND PELVIS WITH CONTRAST TECHNIQUE: Multidetector CT imaging of the chest was performed using the standard protocol during bolus administration of intravenous contrast. Multiplanar CT image reconstructions and MIPs were obtained to evaluate the vascular anatomy. Multidetector CT imaging of the abdomen and pelvis was performed using the standard protocol during bolus administration of intravenous contrast. CONTRAST:  3mL OMNIPAQUE IOHEXOL 240 MG/ML SOLN, 112mL  OMNIPAQUE IOHEXOL 350 MG/ML SOLN COMPARISON:  08/17/2015 CTA chest, abdomen, and pelvis FINDINGS: CTA CHEST FINDINGS There is no evidence of pulmonary emboli. The thoracic aorta is normal in caliber with moderate calcified atherosclerosis noted. Sequelae of interval transcatheter aortic valve replacement are identified. Rate subclavian approach dual lead pacemaker has also been placed with leads terminating in the right atrium and right ventricle. There is a small pericardial effusion which is high in density. Coronary artery calcification is noted. Heart is mildly enlarged. There is no pleural effusion. Dependent subsegmental atelectasis is present in both lower lobes, and this obscures the previously described 3 mm left lower lobe nodule. There is evidence of dish involving the visualized lower cervical and thoracic spine. CT ABDOMEN and PELVIS FINDINGS The liver, gallbladder, spleen, adrenal glands, left kidney, and pancreas have an unremarkable enhanced appearance. Subcentimeter low-density lesion in the upper pole of the right kidney is unchanged and too small to fully characterize but most likely represents a cyst. 1.4 cm high attenuation lesion in the lower pole the right kidney is unchanged and may represent a proteinaceous or hemorrhagic cyst. Oral contrast is present in multiple loops of nondilated small bowel. There  is no evidence of bowel obstruction or bowel wall thickening. Bladder is unremarkable. Uterus is absent. Right ovary is under. Left ovary is again not clearly identified. Relatively diffuse aortoiliac atherosclerotic calcification is again seen. No free fluid or enlarged lymph nodes are identified. Postsurgical changes are noted in the right inguinal region with subcutaneous soft tissue stranding anterior to the femoral vessels and surgical clips. Lumbar spondylosis is noted. Review of the MIP images confirms the above findings. IMPRESSION: 1. No evidence of pulmonary emboli. 2. New, small  left pleural effusion with attenuation suggestive of hemorrhage or proteinaceous material. 3. No acute abnormality identified in the abdomen or pelvis. Electronically Signed: By: Logan Bores M.D. On: 09/24/2015 12:11   Dg Abd Portable 1v  09/24/2015  CLINICAL DATA:  LEFT chest and abdominal pain beginning at 0800 hours, recent pacemaker and valve placement on last month, nausea without diarrhea or vomiting, diabetes mellitus, hypertension, colitis EXAM: PORTABLE ABDOMEN - 1 VIEW COMPARISON:  Portable exam 1031 hours compared to CT abdomen and pelvis of 08/17/2015 FINDINGS: Normal bowel gas pattern. No bowel dilatation or bowel wall thickening. Lung bases clear. Bones demineralized with degenerative changes in dextro convex scoliosis lumbar spine. Old fracture lateral LEFT eighth rib. No urinary tract calcification. IMPRESSION: Normal bowel gas pattern. Electronically Signed   By: Lavonia Dana M.D.   On: 09/24/2015 10:43    EKG:   Orders placed or performed during the hospital encounter of 09/24/15  . ED EKG within 10 minutes  . ED EKG within 10 minutes  . EKG 12-Lead  . EKG 12-Lead  . ED EKG  . ED EKG    IMPRESSION AND PLAN:   Catherine [name]  is a 74 y.o. female with a known history of diabetes, hypothyroidism, rheumatoid arthritis, fibromyalgia and chronic pain, severe aortic stenosis Status post transcatheter aortic valve replacement about 3 weeks ago, postoperatively patient had complications of complete heart block requiring a dual-chamber pacemaker, right-sided pneumothorax requiring of chest tube placement at [facility], discharged home about 2 weeks ago presents to the hospital secondary to chest pain.  #1 Chest pain-likely musculoskeletal pain versus pain from pericarditis. - Admit to tele under observation - cards consult, troponins x 3 - ECHO, very small pericardial effusion vs hemorrhage from her procedure may be - no signs of pericarditis - monitor for now  #2 CAD-70%  LAD lesion. -Monitor troponins. Continue aspirin and Plavix  #3 severe aortic stenosis-status post transcatheter aortic valve replacement via trans-femoral approach about 3-4 weeks ago. -Follow up on echocardiogram. Seems stable. -Recent follow-up with cardiothoracic surgery.  #4 depression and fibromyalgia-continue all her home medications.  #5 diabetes mellitus-continue her oral tablets. -Sliding scale insulin  #6 DVT prophylaxis teds and SCDs at this time   All the records are reviewed and case discussed with ED provider. Management plans discussed with the patient, family and they are in agreement.  CODE STATUS: Full Code  TOTAL TIME TAKING CARE OF THIS PATIENT: 50 minutes.    Gladstone Lighter M.D on 09/24/2015 at 2:42 PM  Between 7am to 6pm - Pager - [phone]  After 6pm go to [email or web address] - password [email or web address] [facility]  Office  [phone]  CC: Primary care physician; Jilda Panda, MD

## 2015-09-25 ENCOUNTER — Observation Stay (HOSPITAL_BASED_OUTPATIENT_CLINIC_OR_DEPARTMENT_OTHER)
Admit: 2015-09-25 | Discharge: 2015-09-25 | Disposition: A | Discharge status: Home / Self Care | Payer: Medicare Other | Attending: Internal Medicine | Admitting: Internal Medicine

## 2015-09-25 ENCOUNTER — Encounter: Payer: Self-pay | Admitting: Physician Assistant

## 2015-09-25 DIAGNOSIS — Z8739 Personal history of other diseases of the musculoskeletal system and connective tissue: Secondary | ICD-10-CM | POA: Diagnosis not present

## 2015-09-25 DIAGNOSIS — I313 Pericardial effusion (noninflammatory): Secondary | ICD-10-CM

## 2015-09-25 DIAGNOSIS — I5033 Acute on chronic diastolic (congestive) heart failure: Secondary | ICD-10-CM

## 2015-09-25 DIAGNOSIS — Z950 Presence of cardiac pacemaker: Secondary | ICD-10-CM | POA: Diagnosis not present

## 2015-09-25 DIAGNOSIS — Z954 Presence of other heart-valve replacement: Secondary | ICD-10-CM | POA: Diagnosis not present

## 2015-09-25 DIAGNOSIS — F329 Major depressive disorder, single episode, unspecified: Secondary | ICD-10-CM

## 2015-09-25 DIAGNOSIS — R0789 Other chest pain: Secondary | ICD-10-CM

## 2015-09-25 LAB — GLUCOSE, CAPILLARY
GLUCOSE-CAPILLARY: 129 mg/dL — AB (ref 65–99)
GLUCOSE-CAPILLARY: 124 mg/dL — AB (ref 65–99)
Glucose-Capillary: 112 mg/dL — ABNORMAL HIGH (ref 65–99)
Glucose-Capillary: 135 mg/dL — ABNORMAL HIGH (ref 65–99)

## 2015-09-25 LAB — TROPONIN I

## 2015-09-25 MED ORDER — FUROSEMIDE 20 MG PO TABS
20.0000 mg | ORAL_TABLET | Freq: Two times a day (BID) | ORAL | Status: DC
  Administered 2015-09-25 – 2015-09-26 (×3): 20 mg via ORAL
  Filled 2015-09-25 (×3): qty 1

## 2015-09-25 MED ORDER — POTASSIUM CHLORIDE CRYS ER 10 MEQ PO TBCR
10.0000 meq | EXTENDED_RELEASE_TABLET | Freq: Two times a day (BID) | ORAL | Status: DC
  Administered 2015-09-25 – 2015-09-26 (×3): 10 meq via ORAL
  Filled 2015-09-25 (×2): qty 1

## 2015-09-25 NOTE — Consult Note (Signed)
Cardiology Consultation Note  Patient ID: Catherine [name], MRN: [id number], DOB/AGE: February 13, 1941 74 y.o. Admit date: 09/24/2015   Date of Consult: 09/25/2015 Primary Physician: Jilda Panda, MD Primary Cardiologist: Dr. Burt Knack, MD  Chief Complaint: Chest pain Reason for Consult: Chest pain and small pericardial effusion seen on CTA chest   HPI: 74 y.o. female with h/o single vessel CAD with calcification and 70% eccentric stenosis of the mid LAD, severe aortic stenosis s/p TAVR (transfemoral approach on 03/21/6221) complicated by complete heart block s/p St Jude Assurity DR dual chamber pacemaker on 9/79/8921, chronic diastolic CHF, history of breast cancer, hypothyroidism, HTN, HLD, DM2, anemia, fibromyalgia, RA, and depression who presented to [facility] on 10/22 with onset of non-exertional chest pain. She was found to have a new, small left pleural effusion with attenuation suggestive of hemorrhage vs proteinaceous material and a small pericardial effusion. Cardiology is consulted for further evaluation.  Patient was originally told about her heart murmur >1 year ago. She has previously been followed by her primary care physician who performed a transthoracic echocardiogram 07/25/2015 demonstrating the presence of critical aortic stenosis. Peak velocity across the aortic valve was measured greater than 5 m/s corresponding to mean transvalvular gradient estimated 64 mmHg and aortic valve area estimated 0.46 cm. Left ventricular systolic function remains preserved with ejection fraction estimated at 55-60%. Diagnostic cardiac cath on 08/11/2015 showed single vessel disease with a 70% mid LAD stenosis with heavy calcification of the entire LAD. There was mild non-obstructive disease in the RCA and LCX. She reported worsening exertional fatigue and increasing dizzy spells over the past year. She lives a sedentary lifestyle and her walking is slow and somewhat unsteady. She uses a cane for assistance. No passing out  episodes. She has also been limited by problems with major depression and anxiety. She ultimately underwent successful transfemoral TAVR on 08/30/2015. However, she had persistent complete heart block post procedure and required temporairy pacing. She was pacing 100% at 35 bpm with her temp pacer with no BB on board. She was seen by EP and it was recommended she undergo PPM placement. She underwent successful St Jude Assurity DR dual-chamber pacemaker on 08/31/2015 [phone] (serial number [id number])). Pacemaker was implanted on the right side 2/2 previous breast cancer surgery on the left. Post TAVR echo looked good with normal gradients and no AI. CXR on 9/29 showed 5-10% right-sided hydropneumothorax new since pacemaker placement. No SOB. Device interrogation was normal. Follow up CXR on 9/30 given worsening SOB showed pneumothorax had increased in size to 30-340% of lung volume. She underwent right side chest tube placement. On 10/1 her breathing began to improve and there was resolution of pneumothorax. Chest tube was free of air leak. 10/2 there was air leak from chest tube and she redeveloped 10-15% pneumothorax. 10/4 CT chest with no air leak from chest tube and scant output. 10/5 feeling better, breathing stable, CXR showed well-expanded lung and felt to be stable for discharge. At her follow up appointment with CT surgery on 10/17 she reported she was doing "fine, with multiple chronic issues." There were no changes made at that visit.   She developed abdominal pain having having a bowel movement and voiding on 10/22 and left-sided non-exertional chest pain. There was some associated SOB. This is the first time she has had chest pain since she was discharged from [facility] at the beginning of October. She has been ambulating around her house without issues with her walker daily, without symptoms. Her weight has been stable  around 183-185. Se has been tolerating medications daily, though she does note some bruising  from both aspirin and Plavix if she bumps into something. She also notes a considerable amount of increased depression since she was discharged from [facility].    Upon her arrival to [facility] she was found to have troponin negative x 4, CTA chest per opening statement with no acute findings along the abdomen or pelvis, CXR with with enlargement of cardiac silhouette s/p PPM o/w no acute abnormalities, plain film ABD negative. Echo is pending. She was admitted under observation. Currently she is without chest pain. Mild SOB.         Past Medical History  Diagnosis Date  . Diabetes mellitus type II   . Thyroid disease   . IBS (irritable bowel syndrome)   . Anemia   . Colon polyps   . Fibromyalgia   . Rheumatoid arthritis(714.0)   . HTN (hypertension)   . Hypercholesteremia   . Acute colitis   . Rectal bleeding   . Stenosis of aortic valve   . Depression   . Shortness of breath dyspnea   . Cancer (HCC)     breast  . Hypothyroidism   . Chronic diastolic congestive heart failure ([place])     a. echo 08/31/2015: EF 65-70%, nl WM, GR1DD, Ao valve stent bioprosthesis was present and functioning nl, no regurg, LA mildly dilated, PASP 46 mm Hg  . S/P TAVR (transcatheter aortic valve replacement) 08/30/2015    26 mm Edwards Sapien 3 transcatheter heart valve placed via open right transfemoral approach  . CHB (complete heart block) ([place]) 08/31/2015    St Jude Medical Assurity DR model [id number] (serial number [id number] )   . CAD (coronary artery disease)     a. heavy calcification of the entire LAD & mod eccentric mid LAD stenosis, estimated @ 70%, mild nonobs stenosis of RCA and LCx, severely calcified Ao valve with restricted valve mobility and 2+ AI        Most Recent Cardiac Studies: Cardiac cath 08/11/2015:  1. Single-vessel coronary artery disease with heavy calcification of the entire LAD and a moderate eccentric mid LAD stenosis, estimated at 70% 2. Mild nonobstructive stenosis of the RCA and left  circumflex 3. Severely calcified aortic valve with restricted valve mobility and 2+ aortic insufficiency, and known severe aortic stenosis by noninvasive evaluation  Continued multidisciplinary evaluation for definitive treatment of severe aortic stenosis   Echo 08/31/2015 (POD # 1 TAVR study):  Study Conclusions  - Left ventricle: The cavity size was normal. Systolic function was vigorous. The estimated ejection fraction was in the range of 65% to 70%. Wall motion was normal; there were no regional wall motion abnormalities. Doppler parameters are consistent with abnormal left ventricular relaxation (grade 1 diastolic dysfunction). Doppler parameters are consistent with elevated mean left atrial filling pressure. - Aortic valve: A stent-valve bioprosthesis was present and functioning normally. There was no regurgitation. Valve area (VTI): 2.26 cm^2. Valve area (Vmax): 2.38 cm^2. Valve area (Vmean): 2.32 cm^2. - Left atrium: The atrium was mildly dilated. - Pulmonary arteries: Systolic pressure was moderately increased. PA peak pressure: 46 mm Hg (S).   Surgical History:  Past Surgical History  Procedure Laterality Date  . Self inflicted chest wound    . Polyp     . Total abdominal hysterectomy    . Cesarean section      x 2  . Left oophorectomy    . Abdominal surgery  Intestinal surgery  . Mastectomy Left   . Spine surgery  1981    lower back  . Transcatheter aortic valve replacement, transfemoral N/A 08/30/2015    Procedure: TRANSCATHETER AORTIC VALVE REPLACEMENT, TRANSFEMORAL;  Surgeon: Sherren Mocha, MD;  Location: [place];  Service: Open Heart Surgery;  Laterality: N/A;  . Tee without cardioversion N/A 08/30/2015    Procedure: TRANSESOPHAGEAL ECHOCARDIOGRAM (TEE);  Surgeon: Sherren Mocha, MD;  Location: [facility];  Service: Open Heart Surgery;  Laterality: N/A;  . Ep implantable device N/A 08/31/2015    Procedure: Pacemaker Implant;  Surgeon: Will  Meredith Leeds, MD; [place] (serial number [id number] ); Laterality: Right     Home Meds: Prior to Admission medications   Medication Sig Start Date End Date Taking? Authorizing Provider  aspirin EC 81 MG tablet Take 81 mg by mouth daily.   Yes Historical Provider, MD  atorvastatin (LIPITOR) 40 MG tablet Take 20 mg by mouth daily with lunch.   Yes Historical Provider, MD  Cholecalciferol (VITAMIN D3) 2000 UNITS TABS Take 2,000 Units by mouth every morning.    Yes Historical Provider, MD  citalopram (CELEXA) 40 MG tablet Take 20 mg by mouth daily with lunch.    Yes Historical Provider, MD  clonazePAM (KLONOPIN) 1 MG tablet Take 1 mg by mouth at bedtime.    Yes Historical Provider, MD  clopidogrel (PLAVIX) 75 MG tablet Take 1 tablet (75 mg total) by mouth daily with breakfast. 09/07/15  Yes Bhavinkumar Bhagat, PA  levothyroxine (SYNTHROID, LEVOTHROID) 100 MCG tablet Take 100 mcg by mouth daily.   Yes Historical Provider, MD  Linagliptin-Metformin HCl (JENTADUETO) 2.5-500 MG TABS Take 0.5 tablets by mouth daily with supper.    Yes Historical Provider, MD  lisinopril (PRINIVIL,ZESTRIL) 10 MG tablet Take 1 tablet (10 mg total) by mouth daily. 09/07/15  Yes Bhavinkumar Bhagat, PA  metoprolol tartrate (LOPRESSOR) 25 MG tablet Take 25 mg by mouth daily.    Yes Historical Provider, MD  Multiple Vitamin (MULTIVITAMIN WITH MINERALS) TABS tablet Take 1 tablet by mouth daily with lunch.   Yes Historical Provider, MD  pantoprazole (PROTONIX) 40 MG tablet Take 1 tablet (40 mg total) by mouth daily. 09/07/15  Yes Bhavinkumar Bhagat, PA  potassium chloride SA (K-DUR,KLOR-CON) 10 MEQ tablet Take 1 tablet (10 mEq total) by mouth daily. 09/07/15  Yes Bhavinkumar Bhagat, PA  tamsulosin (FLOMAX) 0.4 MG CAPS capsule Take 1 capsule (0.4 mg total) by mouth daily. 09/07/15  Yes Bhavinkumar Bhagat, PA  traMADol (ULTRAM) 50 MG tablet Take 50 mg by mouth every 6 (six) hours as needed (pain).    Yes  Historical Provider, MD  ciprofloxacin (CIPRO) 500 MG tablet Take 1 tablet (500 mg total) by mouth 2 (two) times daily. 09/07/15   Bhavinkumar Bhagat, PA  oxyCODONE-acetaminophen (PERCOCET/ROXICET) 5-325 MG tablet Take 1 tablet by mouth every 6 (six) hours as needed for severe pain. 09/07/15   Leanor Kail, PA    Inpatient Medications:  . aspirin EC  81 mg Oral Daily  . atorvastatin  20 mg Oral Q lunch  . cholecalciferol  2,000 Units Oral [id number]  . citalopram  20 mg Oral Q lunch  . clonazePAM  1 mg Oral QHS  . clopidogrel  75 mg Oral Q breakfast  . insulin aspart  0-5 Units Subcutaneous QHS  . insulin aspart  0-9 Units Subcutaneous TID WC  . levothyroxine  100 mcg Oral QAC breakfast  . linagliptin  5 mg Oral Q  supper  . lisinopril  10 mg Oral Daily  . metFORMIN  250 mg Oral Q supper  . metoprolol tartrate  25 mg Oral Daily  . multivitamin with minerals  1 tablet Oral Q lunch  . pantoprazole  40 mg Oral Daily  . potassium chloride SA  10 mEq Oral Daily  . sodium chloride  3 mL Intravenous Q12H  . tamsulosin  0.4 mg Oral Daily      Allergies:  Allergies  Allergen Reactions  . Sulfa Antibiotics Hives  . Latex Rash    Social History   Social History  . Marital Status: Married    Spouse Name: N/A  . Number of Children: N/A  . Years of Education: N/A   Occupational History  . Not on file.   Social History Main Topics  . Smoking status: Never Smoker   . Smokeless tobacco: Not on file  . Alcohol Use: No  . Drug Use: No  . Sexual Activity: Not on file   Other Topics Concern  . Not on file   Social History Narrative     Family History  Problem Relation Age of Onset  . Depression Sister   . Depression Sister   . Depression Sister   . Lung cancer Sister      Review of Systems: Review of Systems  Constitutional: Positive for malaise/fatigue. Negative for fever, chills, weight loss and diaphoresis.  HENT: Negative for congestion.   Eyes: Negative for  discharge and redness.  Respiratory: Positive for shortness of breath. Negative for cough, hemoptysis, sputum production and wheezing.   Cardiovascular: Positive for chest pain. Negative for palpitations, orthopnea, claudication, leg swelling and PND.  Gastrointestinal: Negative for heartburn, nausea, vomiting, abdominal pain, blood in stool and melena.  Genitourinary: Negative for hematuria.  Musculoskeletal: Positive for joint pain. Negative for falls.       Right shoulder  Skin: Negative for rash.  Neurological: Positive for weakness. Negative for dizziness, tingling, tremors, sensory change, focal weakness and loss of consciousness.  Endo/Heme/Allergies: Bruises/bleeds easily.  Psychiatric/Behavioral: The patient is nervous/anxious.   All other systems reviewed and are negative.    Labs:  Recent Labs  09/24/15 1040 09/24/15 1600 09/24/15 2050 09/25/15 0259  TROPONINI <0.03 <0.03 <0.03 <0.03   Lab Results  Component Value Date   WBC 7.7 09/24/2015   HGB 11.1* 09/24/2015   HCT 33.1* 09/24/2015   MCV 81.6 09/24/2015   PLT 230 09/24/2015     Recent Labs Lab 09/24/15 1040  NA 138  K 4.0  CL 105  CO2 26  BUN 18  CREATININE 0.67  CALCIUM 9.4  PROT 6.9  BILITOT 0.7  ALKPHOS 47  ALT 8*  AST 18  GLUCOSE 140*   No results found for: CHOL, HDL, LDLCALC, TRIG No results found for: DDIMER  Radiology/Studies:  Dg Chest 1 View  09/24/2015  CLINICAL DATA:  Chest and abdominal pain beginning at 0800 hours, recent pacemaker placement and valve replacement last month, hypertension, diabetes mellitus EXAM: CHEST 1 VIEW COMPARISON:  Portable exam 1030 hours compared 09/19/2015 FINDINGS: RIGHT subclavian pacemaker with leads projecting over RIGHT atrium and RIGHT ventricle. Enlargement of cardiac silhouette. Atherosclerotic calcification aorta. Mediastinal contours and pulmonary vascularity normal. Lungs hyperinflated but clear. No pulmonary infiltrate, pleural effusion, or  pneumothorax. Diffuse osseous demineralization. IMPRESSION: Enlargement of cardiac silhouette post pacemaker. No acute abnormalities. Electronically Signed   By: Lavonia Dana M.D.   On: 09/24/2015 10:58   Dg Chest 2 View  09/19/2015  CLINICAL DATA:  Post aortic valve repair EXAM: CHEST  2 VIEW COMPARISON:  09/07/2015 FINDINGS: Right pacer remains in place, unchanged. Changes of aortic valve repair. Lungs are clear. No effusions. Heart is borderline in size. No acute bony abnormality. No pneumothorax. IMPRESSION: No active cardiopulmonary disease. Electronically Signed   By: Rolm Baptise M.D.   On: 09/19/2015 13:13   Dg Chest 2 View  09/07/2015  CLINICAL DATA:  Known right pneumothorax status post chest tube removal EXAM: CHEST - 2 VIEW COMPARISON:  09/06/2015 FINDINGS: There has been interval removal of the right-sided chest tube. No recurrent pneumothorax is noted. The left lung remains well aerated. The previously seen left basilar changes have improved in the interval. Minimal blunting of the left costophrenic angle is noted. A pacing device is again seen as well as changes of prior TAVR. IMPRESSION: Status post chest tube removal without significant recurrent pneumothorax. The remainder the exam is unchanged with the exception of improved aeration in the left base. Electronically Signed   By: Inez Catalina M.D.   On: 09/07/2015 08:36   Dg Chest 2 View  09/02/2015  CLINICAL DATA:  Status post pacemaker placement, follow-up of right-sided pneumothorax. EXAM: CHEST  2 VIEW COMPARISON:  PA and lateral chest x-ray of September 01, 2015 FINDINGS: Since a previous study the right-sided pneumothorax has increased in volume and amounts to approximately 30-40% of the lung volume. There is no mediastinal shift. The left lung is clear. The heart and pulmonary vascularity are normal. There is a right perihilar atelectasis. The permanent pacemaker is in reasonable position. The aortic valve cage remains visible. The  bony thorax exhibits no acute abnormality. IMPRESSION: Increasing right-sided pneumothorax now amounting to approximately 30% to 40% of the lung volume. These results were called by telephone at the time of interpretation on 09/02/2015 at 7:52 am to [facility], RN, who verbally acknowledged these results. Electronically Signed   By: David  Martinique M.D.   On: 09/02/2015 07:53   Dg Chest 2 View  09/01/2015  CLINICAL DATA:  Status post permanent pacemaker placement EXAM: CHEST  2 VIEW COMPARISON:  Portable chest x-ray dated August 31, 2015 FINDINGS: There is a small right apical pneumothorax amounting. On the left the lung is well-expanded. There are small bilateral pleural effusions. The cardiac silhouette is normal. The pulmonary vascularity is normal. The permanent pacemaker is in reasonable position. A prosthetic aortic valve cage is visible. The bony thorax is unremarkable. There are degenerative changes of both shoulders. IMPRESSION: There is a 5-10% right-sided hydropneumothorax new since pacemaker placement. There is a trace left pleural effusion. There is no pulmonary edema. These results were called by telephone at the time of interpretation on 09/01/2015 at 7:18 am to Richarda Blade, RN, who verbally acknowledged these results. Electronically Signed   By: David  Martinique M.D.   On: 09/01/2015 07:20   Ct Angio Chest Pe W/cm &/or Wo Cm  09/24/2015  ADDENDUM REPORT: 09/24/2015 13:55 ADDENDUM: Impression #2 should read: "New, small pericardial effusion with attenuation suggestive of hemorrhage or proteinaceous material." Electronically Signed   By: Logan Bores M.D.   On: 09/24/2015 13:55  09/24/2015  CLINICAL DATA:  Left chest pain and left upper abdominal pain. Nausea. Pacemaker placement and transcatheter aortic valve replacement last month. EXAM: CT ANGIOGRAPHY CHEST CT ABDOMEN AND PELVIS WITH CONTRAST TECHNIQUE: Multidetector CT imaging of the chest was performed using the standard protocol  during bolus administration of intravenous contrast. Multiplanar CT image reconstructions and MIPs were  obtained to evaluate the vascular anatomy. Multidetector CT imaging of the abdomen and pelvis was performed using the standard protocol during bolus administration of intravenous contrast. CONTRAST:  36mL OMNIPAQUE IOHEXOL 240 MG/ML SOLN, 163mL OMNIPAQUE IOHEXOL 350 MG/ML SOLN COMPARISON:  08/17/2015 CTA chest, abdomen, and pelvis FINDINGS: CTA CHEST FINDINGS There is no evidence of pulmonary emboli. The thoracic aorta is normal in caliber with moderate calcified atherosclerosis noted. Sequelae of interval transcatheter aortic valve replacement are identified. Rate subclavian approach dual lead pacemaker has also been placed with leads terminating in the right atrium and right ventricle. There is a small pericardial effusion which is high in density. Coronary artery calcification is noted. Heart is mildly enlarged. There is no pleural effusion. Dependent subsegmental atelectasis is present in both lower lobes, and this obscures the previously described 3 mm left lower lobe nodule. There is evidence of dish involving the visualized lower cervical and thoracic spine. CT ABDOMEN and PELVIS FINDINGS The liver, gallbladder, spleen, adrenal glands, left kidney, and pancreas have an unremarkable enhanced appearance. Subcentimeter low-density lesion in the upper pole of the right kidney is unchanged and too small to fully characterize but most likely represents a cyst. 1.4 cm high attenuation lesion in the lower pole the right kidney is unchanged and may represent a proteinaceous or hemorrhagic cyst. Oral contrast is present in multiple loops of nondilated small bowel. There is no evidence of bowel obstruction or bowel wall thickening. Bladder is unremarkable. Uterus is absent. Right ovary is under. Left ovary is again not clearly identified. Relatively diffuse aortoiliac atherosclerotic calcification is again seen. No  free fluid or enlarged lymph nodes are identified. Postsurgical changes are noted in the right inguinal region with subcutaneous soft tissue stranding anterior to the femoral vessels and surgical clips. Lumbar spondylosis is noted. Review of the MIP images confirms the above findings. IMPRESSION: 1. No evidence of pulmonary emboli. 2. New, small left pleural effusion with attenuation suggestive of hemorrhage or proteinaceous material. 3. No acute abnormality identified in the abdomen or pelvis. Electronically Signed: By: Logan Bores M.D. On: 09/24/2015 12:11   Ct Abdomen Pelvis W Contrast  09/24/2015  ADDENDUM REPORT: 09/24/2015 13:55 ADDENDUM: Impression #2 should read: "New, small pericardial effusion with attenuation suggestive of hemorrhage or proteinaceous material." Electronically Signed   By: Logan Bores M.D.   On: 09/24/2015 13:55  09/24/2015  CLINICAL DATA:  Left chest pain and left upper abdominal pain. Nausea. Pacemaker placement and transcatheter aortic valve replacement last month. EXAM: CT ANGIOGRAPHY CHEST CT ABDOMEN AND PELVIS WITH CONTRAST TECHNIQUE: Multidetector CT imaging of the chest was performed using the standard protocol during bolus administration of intravenous contrast. Multiplanar CT image reconstructions and MIPs were obtained to evaluate the vascular anatomy. Multidetector CT imaging of the abdomen and pelvis was performed using the standard protocol during bolus administration of intravenous contrast. CONTRAST:  54mL OMNIPAQUE IOHEXOL 240 MG/ML SOLN, 162mL OMNIPAQUE IOHEXOL 350 MG/ML SOLN COMPARISON:  08/17/2015 CTA chest, abdomen, and pelvis FINDINGS: CTA CHEST FINDINGS There is no evidence of pulmonary emboli. The thoracic aorta is normal in caliber with moderate calcified atherosclerosis noted. Sequelae of interval transcatheter aortic valve replacement are identified. Rate subclavian approach dual lead pacemaker has also been placed with leads terminating in the right  atrium and right ventricle. There is a small pericardial effusion which is high in density. Coronary artery calcification is noted. Heart is mildly enlarged. There is no pleural effusion. Dependent subsegmental atelectasis is present in both lower lobes, and  this obscures the previously described 3 mm left lower lobe nodule. There is evidence of dish involving the visualized lower cervical and thoracic spine. CT ABDOMEN and PELVIS FINDINGS The liver, gallbladder, spleen, adrenal glands, left kidney, and pancreas have an unremarkable enhanced appearance. Subcentimeter low-density lesion in the upper pole of the right kidney is unchanged and too small to fully characterize but most likely represents a cyst. 1.4 cm high attenuation lesion in the lower pole the right kidney is unchanged and may represent a proteinaceous or hemorrhagic cyst. Oral contrast is present in multiple loops of nondilated small bowel. There is no evidence of bowel obstruction or bowel wall thickening. Bladder is unremarkable. Uterus is absent. Right ovary is under. Left ovary is again not clearly identified. Relatively diffuse aortoiliac atherosclerotic calcification is again seen. No free fluid or enlarged lymph nodes are identified. Postsurgical changes are noted in the right inguinal region with subcutaneous soft tissue stranding anterior to the femoral vessels and surgical clips. Lumbar spondylosis is noted. Review of the MIP images confirms the above findings. IMPRESSION: 1. No evidence of pulmonary emboli. 2. New, small left pleural effusion with attenuation suggestive of hemorrhage or proteinaceous material. 3. No acute abnormality identified in the abdomen or pelvis. Electronically Signed: By: Logan Bores M.D. On: 09/24/2015 12:11   Dg Chest Port 1 View  09/06/2015  ADDENDUM REPORT: 09/06/2015 07:44 ADDENDUM: The impression in the report above is normal. The statements under " Comparison" should be under "Findings". The statements  under "Findings" indicating normal heart size and mediastinal contours is erroneous. The corrected report format is as follows: Patient Name: TAYLINN, BRABANT, DOB: [birth year] Accession: [id number] Addition Info: Procedure Code: 71010.4, Referring Physician: Darylene Price, H Last Known Patient Class: U Visit Location:[id number] EXAM: Portable chest one view. COMPARISON:  Portable chest x-ray of September 05, 2015. FINDINGS: A small right apical pneumothorax persists. It is slightly smaller today. The pigtail catheter is unchanged in position on the right. There is no right pleural effusion. On the left lower lobe atelectasis or infiltrate with small effusion is present. The cardiac silhouette remains enlarged. The prosthetic aortic valve cage is again demonstrated. The pulmonary vascularity is not engorged. Stable appearance of the permanent pacemaker. The bony structures are unremarkable. IMPRESSION: Persistent tiny right apical pneumothorax. The chest tube is unchanged in appearance. There is no significant right-sided effusion. Persistent left lower lobe atelectasis-pneumonia with small left pleural effusion. Stable cardiomegaly. Electronically Signed   By: David  Martinique M.D.   On: 09/06/2015 07:44  09/06/2015  CLINICAL DATA:  Follow-up right-sided pneumothorax, chest tube treatment of same. EXAM: PORTABLE CHEST 1 VIEW COMPARISON:  A small right apical pneumothorax persists. It is slightly smaller today. The pigtail catheter is unchanged in position. There is no right pleural effusion. On the left lower lobe atelectasis or infiltrate with small effusion is present. The cardiac silhouette remains enlarged. The prosthetic aortic valve cage is again demonstrated. The pulmonary vascularity is not engorged. Stable appearance of the permanent pacemaker. The bony structures are unremarkable. FINDINGS: The heart size and mediastinal contours are within normal limits. Both lungs are clear. The visualized skeletal structures are  unremarkable. IMPRESSION: Persistent tiny right apical pneumothorax. The chest tube is unchanged in appearance. There is no significant right-sided pleural effusion. Persistent left lower lobe atelectasis -pneumonia with small left pleural effusion. Stable cardiomegaly. Electronically Signed: By: David  Martinique M.D. On: 09/06/2015 07:33   Dg Chest Port 1 View  09/05/2015  CLINICAL DATA:  Pneumothorax, right.  Chest tubes. EXAM: PORTABLE CHEST 1 VIEW COMPARISON:  09/04/2015. FINDINGS: Trachea is midline. Heart is enlarged. Aortic valve replacement. Right subclavian pacemaker lead tips project over the right atrium and right ventricle. Trace residual right apical pneumothorax with pigtail catheter terminating in the lateral right hemi thorax. Linear right perihilar and mild bibasilar atelectasis, left greater than right, with a small left pleural effusion. Aeration in the right lung base has improved in the interval. IMPRESSION: 1. Trace residual right apical pneumothorax with right chest tube in place. 2. Improving right basilar atelectasis. 3. Linear right perihilar atelectasis and mild left basilar atelectasis, stable. 4. Small left pleural effusion, stable. Electronically Signed   By: Lorin Picket M.D.   On: 09/05/2015 08:00   Dg Chest Port 1 View  09/04/2015  CLINICAL DATA:  Aortic valve replacement and pacemaker insertion. Follow-up pneumothorax. EXAM: PORTABLE CHEST 1 VIEW COMPARISON:  09/03/2015 FINDINGS: Cardiomegaly and aortic valve replacement noted. A right sided pacemaker and right thoracostomy tube again noted and unchanged. A 10-15% right apical pneumothorax is now identified. Bibasilar atelectasis and probable trace effusions noted. No other changes identified. IMPRESSION: Small right apical pneumothorax now identified, 10-15%, without other interval change. Right thoracostomy tube again identified. Mild bibasilar atelectasis and possible trace effusions. Cardiomegaly and aortic valve  replacement. Results were called by telephone at the time of interpretation on 09/04/2015 at 9:27 am to [facility], [place], who verbally acknowledged these results. Electronically Signed   By: Margarette Canada M.D.   On: 09/04/2015 09:29   Dg Chest Port 1 View  09/03/2015  CLINICAL DATA:  Pneumothorax.  Chest tube EXAM: PORTABLE CHEST 1 VIEW COMPARISON:  09/02/2015 FINDINGS: Pleural catheter on the right is unchanged. No pneumothorax identified. Pacemaker on the right obscures evaluation of a portion of the right apex. Dual lead pacemaker in good position. Aortic valve replacement noted. Negative for heart failure. Mild left lower lobe atelectasis. IMPRESSION: Right pleural catheter remains in place.  No pneumothorax. Electronically Signed   By: Franchot Gallo M.D.   On: 09/03/2015 09:34   Dg Chest Port 1 View  09/02/2015  CLINICAL DATA:  Chest tube placement EXAM: PORTABLE CHEST 1 VIEW COMPARISON:  09/02/2015 FINDINGS: Pigtail catheter in the right pleural space with a persistent small right apical pneumothorax which is decreased in size compared with earlier film performed same day. There is a dual lead right-sided cardiac pacemaker. There is a linear band of airspace disease in the right perihilar region consistent with atelectasis. There is no other focal parenchymal opacity. There is no pleural effusion or pneumothorax. The heart and mediastinal contours are unremarkable. The osseous structures are unremarkable. IMPRESSION: Interval placement of a right-sided chest tube with a small persistent right apical pneumothorax which has decreased in size compared with the earlier x-ray. Electronically Signed   By: Kathreen Devoid   On: 09/02/2015 10:21   Dg Chest Port 1 View  08/31/2015  CLINICAL DATA:  Postop valve repair. EXAM: PORTABLE CHEST 1 VIEW COMPARISON:  08/30/2015 FINDINGS: Transvenous pacer wire tip is in the right ventricle, entering from the IVC, stable. Changes of aortic valve repair interval removal of Swan-Ganz  catheter. Mild cardiomegaly. No confluent opacities or effusions. No acute bony abnormality. No pneumothorax. IMPRESSION: No acute findings.  Mild cardiomegaly. Electronically Signed   By: Rolm Baptise M.D.   On: 08/31/2015 07:35   Dg Chest Port 1 View  08/30/2015  CLINICAL DATA:  Severe aortic stenosis. EXAM: PORTABLE CHEST 1 VIEW COMPARISON:  08/25/2015. FINDINGS: The  patient has undergone transcatheter aortic valve replacement. Grossly satisfactory positioning of the valve over the heart. Cardiomegaly. Swan-Ganz catheter tip RIGHT main pulmonary artery. Probable temporary pacer lead from a femoral approach, lying within the RIGHT ventricle. Mild vascular congestion. No infiltrates or overt edema. No osseous findings. IMPRESSION: Satisfactory postoperative TAVR. Electronically Signed   By: Staci Righter M.D.   On: 08/30/2015 15:22   Dg Abd Portable 1v  09/24/2015  CLINICAL DATA:  LEFT chest and abdominal pain beginning at 0800 hours, recent pacemaker and valve placement on last month, nausea without diarrhea or vomiting, diabetes mellitus, hypertension, colitis EXAM: PORTABLE ABDOMEN - 1 VIEW COMPARISON:  Portable exam 1031 hours compared to CT abdomen and pelvis of 08/17/2015 FINDINGS: Normal bowel gas pattern. No bowel dilatation or bowel wall thickening. Lung bases clear. Bones demineralized with degenerative changes in dextro convex scoliosis lumbar spine. Old fracture lateral LEFT eighth rib. No urinary tract calcification. IMPRESSION: Normal bowel gas pattern. Electronically Signed   By: Lavonia Dana M.D.   On: 09/24/2015 10:43    EKG: V paced, 74 bpm  Weights: Filed Weights   09/24/15 0952  Weight: 181 lb (82.101 kg)     Physical Exam: Blood pressure 167/62, pulse 70, temperature 98.2 F (36.8 C), temperature source Oral, resp. rate 18, height 5\' 2"  (1.575 m), weight 181 lb (82.101 kg), SpO2 98 %. Body mass index is 33.1 kg/(m^2). General: Well developed, well nourished, in no acute  distress. Head: Normocephalic, atraumatic, sclera non-icteric, no xanthomas, nares are without discharge.  Neck: Negative for carotid bruits. JVD not elevated. Lungs: Faint bilateral crackles along the bases, left > right. Breathing is unlabored. Heart: RRR with S1 H2.D>JM systolic murmurs. No rubs or gallops appreciated. Abdomen: Soft, non-tender, non-distended with normoactive bowel sounds. No hepatomegaly. No rebound/guarding. No obvious abdominal masses. Msk:  Strength and tone appear normal for age. Extremities: No clubbing or cyanosis. No edema.  Distal pedal pulses are 2+ and equal bilaterally. Neuro: Alert and oriented X 3. No facial asymmetry. No focal deficit. Moves all extremities spontaneously. Psych:  Responds to questions appropriately with a normal affect.    Assessment and Plan:  74 y.o. female with h/o single vessel CAD with calcification and 70% eccentric stenosis of the mid LAD, severe aortic stenosis s/p TAVR (transfemoral approach on 03/28/8340) complicated by complete heart block s/p St Jude [employer] DR dual chamber pacemaker on 9/62/2297, chronic diastolic CHF, history of breast cancer, hypothyroidism, HTN, HLD, DM2, anemia, fibromyalgia, RA, and depression who presented to [facility] on 10/22 with onset of non-exertional chest pain. She was found to have a new, small left pleural effusion with attenuation suggestive of hemorrhage vs proteinaceous material and a small pericardial effusion. Cardiology is consulted for further evaluation.  1. Acute on chronic diastolic CHF: -Start low dose po Lasix 20 mg bid with potassium supplementation 10 meq bid -Continue Lopressor 25 mg , lisinopril 10 mg   2. Atypical chest pain: -Consider pericarditis vs MSK -Could treat with colchicine empirically  -Await echo as below    3. CAD as above: -Recent cardiac cath 08/11/2015 as above -Unlikely ACS at this time -Troponin negative x 4 -Echo pending to evaluate LV function, wall motion,  right-sided pressure, and aortic valve  4. History of aortic stenosis s/p TAVR: -Previous echo showed well functioning aortic valve -Check echo as above -Continue aspirin and Plavix  5. Complete heart block: -Stable -Status post St Jude PPM -Followed by EP  6. History of breast cancer: -Stable  7. Depression: -Worsened since her discharge from [facility] -Could benefit from outpatient follow up and evaluation   8. DM2: -Per IM  9. HTN: -Poorly controlled -Lasix started as above -Continue Lopressor and lisinopril   10. HLD: -Lipitor 20 mg   11. Anemia: -Stable  12. Fibromyalgia/RA: -Perhaps playing a role in the above    Signed, Christell Faith, PA-C Pager: [phone] 09/25/2015, 7:27 AM

## 2015-09-25 NOTE — Progress Notes (Signed)
*  PRELIMINARY RESULTS* Echocardiogram 2D Echocardiogram has been performed.  Catherine [name] Stills 09/25/2015, 8:09 AM

## 2015-09-25 NOTE — Progress Notes (Signed)
[place] [facility] [place] NAME: Catherine [name]    MR#:  [id number]  DATE OF BIRTH:  11/28/1941  SUBJECTIVE:  CHIEF COMPLAINT:   Chief Complaint  Patient presents with  . Chest Pain  . Abdominal Pain   Complains of vague abd pain and left chest pain. Pleuritis. Extremely anxious. Family at bedside. REVIEW OF SYSTEMS:    Review of Systems  Constitutional: Positive for malaise/fatigue. Negative for fever, chills and weight loss.  HENT: Negative for hearing loss and nosebleeds.   Eyes: Negative for blurred vision, double vision and pain.  Respiratory: Negative for cough, hemoptysis, sputum production, shortness of breath and wheezing.   Cardiovascular: Positive for chest pain. Negative for palpitations, orthopnea and leg swelling.  Gastrointestinal: Positive for abdominal pain. Negative for nausea, vomiting, diarrhea and constipation.  Genitourinary: Negative for dysuria and hematuria.  Musculoskeletal: Negative for myalgias, back pain and falls.  Skin: Negative for rash.  Neurological: Negative for dizziness, tremors, sensory change, speech change, focal weakness, seizures and headaches.  Endo/Heme/Allergies: Does not bruise/bleed easily.  Psychiatric/Behavioral: Negative for depression and memory loss. The patient is nervous/anxious.       DRUG ALLERGIES:   Allergies  Allergen Reactions  . Sulfa Antibiotics Hives  . Latex Rash    VITALS:  Blood pressure 145/49, pulse 58, temperature 98.6 F (37 C), temperature source Oral, resp. rate 18, height 5\' 2"  (1.575 m), weight 82.101 kg (181 lb), SpO2 96 %.  PHYSICAL EXAMINATION:   Physical Exam  GENERAL:  74 y.o.-year-old patient lying in the bed with no acute distress.  EYES: Pupils equal, round, reactive to light and accommodation. No scleral icterus. Extraocular muscles intact.  HEENT: Head atraumatic, normocephalic. Oropharynx and nasopharynx clear.  NECK:  Supple, no jugular  venous distention. No thyroid enlargement, no tenderness.  LUNGS: Normal breath sounds bilaterally, no wheezing, rales, rhonchi. No use of accessory muscles of respiration.  CARDIOVASCULAR: S1, S2 normal. No murmurs, rubs, or gallops.  ABDOMEN: Soft, nontender, nondistended. Bowel sounds present. No organomegaly or mass.  EXTREMITIES: No cyanosis, clubbing or edema b/l.    NEUROLOGIC: Cranial nerves II through XII are intact. No focal Motor or sensory deficits b/l.   PSYCHIATRIC: The patient is alert and oriented x 3.  SKIN: No obvious rash, lesion, or ulcer.    LABORATORY PANEL:   CBC  Recent Labs Lab 09/24/15 1040  WBC 7.7  HGB 11.1*  HCT 33.1*  PLT 230   ------------------------------------------------------------------------------------------------------------------  Chemistries   Recent Labs Lab 09/24/15 1040  NA 138  K 4.0  CL 105  CO2 26  GLUCOSE 140*  BUN 18  CREATININE 0.67  CALCIUM 9.4  AST 18  ALT 8*  ALKPHOS 13  BILITOT 0.7   ------------------------------------------------------------------------------------------------------------------  Cardiac Enzymes  Recent Labs Lab 09/25/15 0259  TROPONINI <0.03   ------------------------------------------------------------------------------------------------------------------  RADIOLOGY:  Dg Chest 1 View  09/24/2015  CLINICAL DATA:  Chest and abdominal pain beginning at 0800 hours, recent pacemaker placement and valve replacement last month, hypertension, diabetes mellitus EXAM: CHEST 1 VIEW COMPARISON:  Portable exam 1030 hours compared 09/19/2015 FINDINGS: RIGHT subclavian pacemaker with leads projecting over RIGHT atrium and RIGHT ventricle. Enlargement of cardiac silhouette. Atherosclerotic calcification aorta. Mediastinal contours and pulmonary vascularity normal. Lungs hyperinflated but clear. No pulmonary infiltrate, pleural effusion, or pneumothorax. Diffuse osseous demineralization. IMPRESSION:  Enlargement of cardiac silhouette post pacemaker. No acute abnormalities. Electronically Signed   By: Lavonia Dana M.D.   On: 09/24/2015  10:58   Ct Angio Chest Pe W/cm &/or Wo Cm  09/24/2015  ADDENDUM REPORT: 09/24/2015 13:55 ADDENDUM: Impression #2 should read: "New, small pericardial effusion with attenuation suggestive of hemorrhage or proteinaceous material." Electronically Signed   By: Logan Bores M.D.   On: 09/24/2015 13:55  09/24/2015  CLINICAL DATA:  Left chest pain and left upper abdominal pain. Nausea. Pacemaker placement and transcatheter aortic valve replacement last month. EXAM: CT ANGIOGRAPHY CHEST CT ABDOMEN AND PELVIS WITH CONTRAST TECHNIQUE: Multidetector CT imaging of the chest was performed using the standard protocol during bolus administration of intravenous contrast. Multiplanar CT image reconstructions and MIPs were obtained to evaluate the vascular anatomy. Multidetector CT imaging of the abdomen and pelvis was performed using the standard protocol during bolus administration of intravenous contrast. CONTRAST:  46mL OMNIPAQUE IOHEXOL 240 MG/ML SOLN, 126mL OMNIPAQUE IOHEXOL 350 MG/ML SOLN COMPARISON:  08/17/2015 CTA chest, abdomen, and pelvis FINDINGS: CTA CHEST FINDINGS There is no evidence of pulmonary emboli. The thoracic aorta is normal in caliber with moderate calcified atherosclerosis noted. Sequelae of interval transcatheter aortic valve replacement are identified. Rate subclavian approach dual lead pacemaker has also been placed with leads terminating in the right atrium and right ventricle. There is a small pericardial effusion which is high in density. Coronary artery calcification is noted. Heart is mildly enlarged. There is no pleural effusion. Dependent subsegmental atelectasis is present in both lower lobes, and this obscures the previously described 3 mm left lower lobe nodule. There is evidence of dish involving the visualized lower cervical and thoracic spine. CT ABDOMEN  and PELVIS FINDINGS The liver, gallbladder, spleen, adrenal glands, left kidney, and pancreas have an unremarkable enhanced appearance. Subcentimeter low-density lesion in the upper pole of the right kidney is unchanged and too small to fully characterize but most likely represents a cyst. 1.4 cm high attenuation lesion in the lower pole the right kidney is unchanged and may represent a proteinaceous or hemorrhagic cyst. Oral contrast is present in multiple loops of nondilated small bowel. There is no evidence of bowel obstruction or bowel wall thickening. Bladder is unremarkable. Uterus is absent. Right ovary is under. Left ovary is again not clearly identified. Relatively diffuse aortoiliac atherosclerotic calcification is again seen. No free fluid or enlarged lymph nodes are identified. Postsurgical changes are noted in the right inguinal region with subcutaneous soft tissue stranding anterior to the femoral vessels and surgical clips. Lumbar spondylosis is noted. Review of the MIP images confirms the above findings. IMPRESSION: 1. No evidence of pulmonary emboli. 2. New, small left pleural effusion with attenuation suggestive of hemorrhage or proteinaceous material. 3. No acute abnormality identified in the abdomen or pelvis. Electronically Signed: By: Logan Bores M.D. On: 09/24/2015 12:11   Ct Abdomen Pelvis W Contrast  09/24/2015  ADDENDUM REPORT: 09/24/2015 13:55 ADDENDUM: Impression #2 should read: "New, small pericardial effusion with attenuation suggestive of hemorrhage or proteinaceous material." Electronically Signed   By: Logan Bores M.D.   On: 09/24/2015 13:55  09/24/2015  CLINICAL DATA:  Left chest pain and left upper abdominal pain. Nausea. Pacemaker placement and transcatheter aortic valve replacement last month. EXAM: CT ANGIOGRAPHY CHEST CT ABDOMEN AND PELVIS WITH CONTRAST TECHNIQUE: Multidetector CT imaging of the chest was performed using the standard protocol during bolus administration  of intravenous contrast. Multiplanar CT image reconstructions and MIPs were obtained to evaluate the vascular anatomy. Multidetector CT imaging of the abdomen and pelvis was performed using the standard protocol during bolus administration of intravenous contrast.  CONTRAST:  64mL OMNIPAQUE IOHEXOL 240 MG/ML SOLN, 155mL OMNIPAQUE IOHEXOL 350 MG/ML SOLN COMPARISON:  08/17/2015 CTA chest, abdomen, and pelvis FINDINGS: CTA CHEST FINDINGS There is no evidence of pulmonary emboli. The thoracic aorta is normal in caliber with moderate calcified atherosclerosis noted. Sequelae of interval transcatheter aortic valve replacement are identified. Rate subclavian approach dual lead pacemaker has also been placed with leads terminating in the right atrium and right ventricle. There is a small pericardial effusion which is high in density. Coronary artery calcification is noted. Heart is mildly enlarged. There is no pleural effusion. Dependent subsegmental atelectasis is present in both lower lobes, and this obscures the previously described 3 mm left lower lobe nodule. There is evidence of dish involving the visualized lower cervical and thoracic spine. CT ABDOMEN and PELVIS FINDINGS The liver, gallbladder, spleen, adrenal glands, left kidney, and pancreas have an unremarkable enhanced appearance. Subcentimeter low-density lesion in the upper pole of the right kidney is unchanged and too small to fully characterize but most likely represents a cyst. 1.4 cm high attenuation lesion in the lower pole the right kidney is unchanged and may represent a proteinaceous or hemorrhagic cyst. Oral contrast is present in multiple loops of nondilated small bowel. There is no evidence of bowel obstruction or bowel wall thickening. Bladder is unremarkable. Uterus is absent. Right ovary is under. Left ovary is again not clearly identified. Relatively diffuse aortoiliac atherosclerotic calcification is again seen. No free fluid or enlarged lymph  nodes are identified. Postsurgical changes are noted in the right inguinal region with subcutaneous soft tissue stranding anterior to the femoral vessels and surgical clips. Lumbar spondylosis is noted. Review of the MIP images confirms the above findings. IMPRESSION: 1. No evidence of pulmonary emboli. 2. New, small left pleural effusion with attenuation suggestive of hemorrhage or proteinaceous material. 3. No acute abnormality identified in the abdomen or pelvis. Electronically Signed: By: Logan Bores M.D. On: 09/24/2015 12:11   Dg Abd Portable 1v  09/24/2015  CLINICAL DATA:  LEFT chest and abdominal pain beginning at 0800 hours, recent pacemaker and valve placement on last month, nausea without diarrhea or vomiting, diabetes mellitus, hypertension, colitis EXAM: PORTABLE ABDOMEN - 1 VIEW COMPARISON:  Portable exam 1031 hours compared to CT abdomen and pelvis of 08/17/2015 FINDINGS: Normal bowel gas pattern. No bowel dilatation or bowel wall thickening. Lung bases clear. Bones demineralized with degenerative changes in dextro convex scoliosis lumbar spine. Old fracture lateral LEFT eighth rib. No urinary tract calcification. IMPRESSION: Normal bowel gas pattern. Electronically Signed   By: Lavonia Dana M.D.   On: 09/24/2015 10:43     ASSESSMENT AND PLAN:   Catherine [name] is a 74 y.o. female with a known history of diabetes, hypothyroidism, rheumatoid arthritis, fibromyalgia and chronic pain, severe aortic stenosis Status post transcatheter aortic valve replacement about 3 weeks ago, postoperatively patient had complications of complete heart block requiring a dual-chamber pacemaker, right-sided pneumothorax requiring of chest tube placement at [facility], discharged home about 2 weeks ago presents to the hospital secondary to chest pain.   # Chest pain-likely musculoskeletal pain versus pain from pericarditis. - Admit to tele under observation - cards consult  very small pericardial  effusion vs hemorrhage from her procedure may be - no signs of pericarditis - monitor for now troponin normal  * Chest and abdominal pain likely musculoskeletal.  # CAD-70% LAD lesion. -Monitor troponins. Continue aspirin and Plavix  # severe aortic stenosis-status post transcatheter aortic valve replacement via trans-femoral  approach 4 weeks ago. -Follow up on echocardiogram. Seems stable. -Recent follow-up with cardiothoracic surgery. - Concern for hemopericardium on CT scan. Echocardiogram has been done and report pending. Concern for hemopericardium. Discussed with Dr. Ellyn Hack of cardiology.   # depression and fibromyalgia-continue all her home medications.  # diabetes mellitus-continue her oral tablets. -Sliding scale insulin  # DVT prophylaxis teds and SCDs at this time   All the records are reviewed and case discussed with Care Management/Social Workerr. Management plans discussed with the patient, family and they are in agreement.  CODE STATUS: Full code  DVT Prophylaxis: SCDs  TOTAL TIME TAKING CARE OF THIS PATIENT: 35 minutes.  Discussed in detail with family including explanation with diagrams on drawing board regarding possible pericardial effusion.  Possible discharge tomorrow if echocardiogram normal.   Hillary Bow R M.D on 09/25/2015 at 11:44 AM  Between 7am to 6pm - Pager - [phone]  After 6pm go to [email or web address] - password [email or web address]  Office  [phone]  CC: Primary care physician; Jilda Panda, MD    Note: This dictation was prepared with Dragon dictation along with smaller phrase technology. Any transcriptional errors that result from this process are unintentional.

## 2015-09-26 LAB — GLUCOSE, CAPILLARY
GLUCOSE-CAPILLARY: 121 mg/dL — AB (ref 65–99)
Glucose-Capillary: 134 mg/dL — ABNORMAL HIGH (ref 65–99)

## 2015-09-26 MED ORDER — ACETAMINOPHEN 325 MG PO TABS: 650.0000 mg | ORAL_TABLET | Freq: Four times a day (QID) | ORAL | Status: DC | PRN

## 2015-09-26 NOTE — Discharge Instructions (Signed)
Chest Wall Pain °Chest wall pain is pain in or around the bones and muscles of your chest. Sometimes, an injury causes this pain. Sometimes, the cause may not be known. This pain may take several weeks or longer to get better. °HOME CARE °Pay attention to any changes in your symptoms. Take these actions to help with your pain: °· Rest as told by your doctor. °· Avoid activities that cause pain. Try not to use your chest, belly (abdominal), or side muscles to lift heavy things. °· If directed, apply ice to the painful area: °¨ Put ice in a plastic bag. °¨ Place a towel between your skin and the bag. °¨ Leave the ice on for 20 minutes, 2-3 times per day. °· Take over-the-counter and prescription medicines only as told by your doctor. °· Do not use tobacco products, including cigarettes, chewing tobacco, and e-cigarettes. If you need help quitting, ask your doctor. °· Keep all follow-up visits as told by your doctor. This is important. °GET HELP IF: °· You have a fever. °· Your chest pain gets worse. °· You have new symptoms. °GET HELP RIGHT AWAY IF: °· You feel sick to your stomach (nauseous) or you throw up (vomit). °· You feel sweaty or light-headed. °· You have a cough with phlegm (sputum) or you cough up blood. °· You are short of breath. °  °This information is not intended to replace advice given to you by your health care provider. Make sure you discuss any questions you have with your health care provider. °  °Document Released: 05/07/2008 Document Revised: 08/10/2015 Document Reviewed: 02/14/2015 °Elsevier Interactive Patient Education ©2016 [employer] Inc. ° °

## 2015-09-26 NOTE — Progress Notes (Signed)
Removed telemetry and removed iv.  Patient alert and oriented, vss.  No complaints of pain.  Husband at bedside.  Patient to be escorted out of hospital via wheelchair by volunteers.

## 2015-09-27 NOTE — Discharge Summary (Signed)
[facility] NAME: Catherine [name]    MR#:  [id number]  DATE OF BIRTH:  1941-09-02  DATE OF ADMISSION:  09/24/2015 ADMITTING PHYSICIAN: Gladstone Lighter, MD  DATE OF DISCHARGE: 09/26/2015  3:21 PM  PRIMARY CARE PHYSICIAN: Jilda Panda, MD    ADMISSION DIAGNOSIS:  Pericardial effusion [I31.9] Left upper quadrant abdominal tenderness [R10.812] Left sided chest pain [R07.9]  DISCHARGE DIAGNOSIS:  Active Problems:   Chest pain  SECONDARY DIAGNOSIS:   Past Medical History  Diagnosis Date  . Diabetes mellitus type II   . Thyroid disease   . IBS (irritable bowel syndrome)   . Anemia   . Colon polyps   . Fibromyalgia   . Rheumatoid arthritis(714.0)   . HTN (hypertension)   . Hypercholesteremia   . Acute colitis   . Rectal bleeding   . Stenosis of aortic valve   . Depression   . Shortness of breath dyspnea   . Cancer (HCC)     breast  . Hypothyroidism   . Chronic diastolic congestive heart failure ([place])     a. echo 08/31/2015: EF 65-70%, nl WM, GR1DD, Ao valve stent bioprosthesis was present and functioning nl, no regurg, LA mildly dilated, PASP 46 mm Hg  . S/P TAVR (transcatheter aortic valve replacement) 08/30/2015    26 mm Edwards Sapien 3 transcatheter heart valve placed via open right transfemoral approach  . CHB (complete heart block) ([place]) 08/31/2015    St Jude Medical Assurity DR model [id number] (serial number [id number] )   . CAD (coronary artery disease)     a. heavy calcification of the entire LAD & mod eccentric mid LAD stenosis, estimated @ 70%, mild nonobs stenosis of RCA and LCx, severely calcified Ao valve with restricted valve mobility and 2+ AI     HOSPITAL COURSE:  74 y.o. female with a known history of diabetes, hypothyroidism, rheumatoid arthritis, fibromyalgia and chronic pain, severe aortic stenosis Status post transcatheter aortic valve replacement about 3 weeks ago, postoperatively patient had  complications of complete heart block requiring a dual-chamber pacemaker, right-sided pneumothorax requiring of chest tube placement at [facility], discharged home about 2 weeks ago admitted to the hospital secondary to chest pain.  Please see Dr. Michelene Heady dictated history and physical for further details.  A CT scan was performed which did not show pulmonary embolus but did show possible pericardial effusion. She was noted to have some reproducible costochondral pain along the left lower rib border. Her chest discomfort is not positional -- i.e. not made worse with sitting up or laying down. It was worse with inspiration or coughing.  Cardiology consultation was obtained who recommended 2-D echo which was performed which was within normal limits. Her chest pain was thought to be non- anginal in nature, simply because she just had a cardiac catheterization showing moderate obstructive CAD.  Cardiology did not recommend any further ischemic evaluation at this time.  Cardiology recommended probably Tylenol as analgesic as she was already on aspirin and Plavix.  She was feeling better and was discharged home in stable condition. DISCHARGE CONDITIONS:  stable CONSULTS OBTAINED:  Treatment Team:  Leonie Man, MD DRUG ALLERGIES:   Allergies  Allergen Reactions  . Sulfa Antibiotics Hives  . Latex Rash   DISCHARGE MEDICATIONS:   Discharge Medication List as of 09/26/2015  2:50 PM    START taking these medications   Details  acetaminophen (TYLENOL) 325 MG tablet Take 2 tablets (650 mg  total) by mouth every 6 (six) hours as needed for mild pain (or Fever >/= 101)., Starting 09/26/2015, Until Discontinued, Normal      CONTINUE these medications which have NOT CHANGED   Details  aspirin EC 81 MG tablet Take 81 mg by mouth daily., Until Discontinued, Historical Med    atorvastatin (LIPITOR) 40 MG tablet Take 20 mg by mouth daily with lunch., Until Discontinued, Historical Med     Cholecalciferol (VITAMIN D3) 2000 UNITS TABS Take 2,000 Units by mouth every morning. , Until Discontinued, Historical Med    citalopram (CELEXA) 40 MG tablet Take 20 mg by mouth daily with lunch. , Until Discontinued, Historical Med    clonazePAM (KLONOPIN) 1 MG tablet Take 1 mg by mouth at bedtime. , Until Discontinued, Historical Med    clopidogrel (PLAVIX) 75 MG tablet Take 1 tablet (75 mg total) by mouth daily with breakfast., Starting 09/07/2015, Until Discontinued, Normal    levothyroxine (SYNTHROID, LEVOTHROID) 100 MCG tablet Take 100 mcg by mouth daily., Until Discontinued, Historical Med    Linagliptin-Metformin HCl (JENTADUETO) 2.5-500 MG TABS Take 0.5 tablets by mouth daily with supper. , Until Discontinued, Historical Med    lisinopril (PRINIVIL,ZESTRIL) 10 MG tablet Take 1 tablet (10 mg total) by mouth daily., Starting 09/07/2015, Until Discontinued, Normal    metoprolol tartrate (LOPRESSOR) 25 MG tablet Take 25 mg by mouth daily. , Until Discontinued, Historical Med    Multiple Vitamin (MULTIVITAMIN WITH MINERALS) TABS tablet Take 1 tablet by mouth daily with lunch., Until Discontinued, Historical Med    pantoprazole (PROTONIX) 40 MG tablet Take 1 tablet (40 mg total) by mouth daily., Starting 09/07/2015, Until Discontinued, Normal    potassium chloride SA (K-DUR,KLOR-CON) 10 MEQ tablet Take 1 tablet (10 mEq total) by mouth daily., Starting 09/07/2015, Until Discontinued, Normal    tamsulosin (FLOMAX) 0.4 MG CAPS capsule Take 1 capsule (0.4 mg total) by mouth daily., Starting 09/07/2015, Until Discontinued, Normal    traMADol (ULTRAM) 50 MG tablet Take 50 mg by mouth every 6 (six) hours as needed (pain). , Until Discontinued, Historical Med      STOP taking these medications     ciprofloxacin (CIPRO) 500 MG tablet      oxyCODONE-acetaminophen (PERCOCET/ROXICET) 5-325 MG tablet        DISCHARGE INSTRUCTIONS:   DIET:  Cardiac diet DISCHARGE CONDITION:   Good ACTIVITY:  Activity as tolerated OXYGEN:  Home Oxygen: No.  Oxygen Delivery: room air DISCHARGE LOCATION:  home   If you experience worsening of your admission symptoms, develop shortness of breath, life threatening emergency, suicidal or homicidal thoughts you must seek medical attention immediately by calling 911 or calling your MD immediately  if symptoms less severe.  You Must read complete instructions/literature along with all the possible adverse reactions/side effects for all the Medicines you take and that have been prescribed to you. Take any new Medicines after you have completely understood and accpet all the possible adverse reactions/side effects.   Please note  You were cared for by a hospitalist during your hospital stay. If you have any questions about your discharge medications or the care you received while you were in the hospital after you are discharged, you can call the unit and asked to speak with the hospitalist on call if the hospitalist that took care of you is not available. Once you are discharged, your primary care physician will handle any further medical issues. Please note that NO REFILLS for any discharge medications will be authorized  once you are discharged, as it is imperative that you return to your primary care physician (or establish a relationship with a primary care physician if you do not have one) for your aftercare needs so that they can reassess your need for medications and monitor your lab values.    On the day of Discharge: VITAL SIGNS:  Blood pressure 133/69, pulse 73, temperature 98 F (36.7 C), temperature source Oral, resp. rate 20, height 5\' 2"  (1.575 m), weight 82.101 kg (181 lb), SpO2 97 %. PHYSICAL EXAMINATION:  GENERAL:  74 y.o.-year-old patient lying in the bed with no acute distress.  EYES: Pupils equal, round, reactive to light and accommodation. No scleral icterus. Extraocular muscles intact.  HEENT: Head atraumatic,  normocephalic. Oropharynx and nasopharynx clear.  NECK:  Supple, no jugular venous distention. No thyroid enlargement, no tenderness.  LUNGS: Normal breath sounds bilaterally, no wheezing, rales,rhonchi or crepitation. No use of accessory muscles of respiration.  CARDIOVASCULAR: S1, S2 normal. No murmurs, rubs, or gallops.  ABDOMEN: Soft, non-tender, non-distended. Bowel sounds present. No organomegaly or mass.  EXTREMITIES: No pedal edema, cyanosis, or clubbing.  NEUROLOGIC: Cranial nerves II through XII are intact. Muscle strength 5/5 in all extremities. Sensation intact. Gait not checked.  PSYCHIATRIC: The patient is alert and oriented x 3.  SKIN: No obvious rash, lesion, or ulcer.  DATA REVIEW:   CBC  Recent Labs Lab 09/24/15 1040  WBC 7.7  HGB 11.1*  HCT 33.1*  PLT 230    Chemistries   Recent Labs Lab 09/24/15 1040  NA 138  K 4.0  CL 105  CO2 26  GLUCOSE 140*  BUN 18  CREATININE 0.67  CALCIUM 9.4  AST 18  ALT 8*  ALKPHOS 98  BILITOT 0.7    Management plans discussed with the patient, family and they are in agreement.  CODE STATUS: Full code  TOTAL TIME TAKING CARE OF THIS PATIENT: 55 minutes.    [facility], Catherine [name] M.D on 09/27/2015 at 5:12 PM  Between 7am to 6pm - Pager - [phone]  After 6pm go to [email or web address] - password [email or web address] [facility]  Office  [phone]  CC: Primary care physician; Jilda Panda, MD Leanor Kail, PA Leonie Man, M.D., M.S.

## 2015-09-30 ENCOUNTER — Telehealth: Payer: Self-pay | Admitting: *Deleted

## 2015-09-30 NOTE — Telephone Encounter (Signed)
Attempted to reach patient to follow-up on right shoulder stiffness post-PPM implant.  No answer, will attempt again.

## 2015-09-30 NOTE — Telephone Encounter (Signed)
LMOM requesting call back

## 2015-09-30 NOTE — Telephone Encounter (Signed)
Patient returned phone call.  She states that she has a follow-up appointment with her PCP on Monday.  Advised that per Dr. Curt Bears, she should describe her shoulder stiffness to her PCP for possible referral to outpatient occupational therapy.  Patient voices understanding and is appreciative of return call.  She denies any additional questions or concerns at this time.

## 2015-09-30 NOTE — Telephone Encounter (Signed)
Able to reach patient.  Patient states that she is still having right shoulder stiffness and discomfort with movement.  She states that she has been doing the ROM exercises that were discussed at her wound check appointment on 09/19/15.  Advised patient that I would discuss her symptoms with Dr. Curt Bears and call her back.  Per Dr. Curt Bears, have patient follow-up with PCP for possible referral to outpatient occupational therapy.  Will call patient back to let her know.

## 2015-10-07 ENCOUNTER — Encounter: Payer: Self-pay | Admitting: Cardiovascular Disease

## 2015-10-07 ENCOUNTER — Ambulatory Visit (HOSPITAL_COMMUNITY): Payer: Medicare Other

## 2015-10-07 ENCOUNTER — Telehealth: Payer: Self-pay | Admitting: Cardiovascular Disease

## 2015-10-07 ENCOUNTER — Ambulatory Visit (INDEPENDENT_AMBULATORY_CARE_PROVIDER_SITE_OTHER): Payer: Medicare Other | Admitting: Cardiovascular Disease

## 2015-10-07 ENCOUNTER — Other Ambulatory Visit: Payer: Self-pay | Admitting: Cardiovascular Disease

## 2015-10-07 VITALS — BP 120/58 | HR 69 | Ht 62.0 in | Wt 186.0 lb

## 2015-10-07 DIAGNOSIS — I350 Nonrheumatic aortic (valve) stenosis: Secondary | ICD-10-CM

## 2015-10-07 DIAGNOSIS — Z953 Presence of xenogenic heart valve: Secondary | ICD-10-CM

## 2015-10-07 NOTE — Patient Instructions (Signed)
Medication Instructions:  Your physician recommends that you continue on your current medications as directed. Please refer to the Current Medication list given to you today.  Please call the office at [phone] with name and dosage of new medication so that we can update your medication list.   Labwork: No new orders.   Testing/Procedures: No new orders.   Follow-Up: Your physician recommends that you schedule a follow-up appointment in: 2 MONTHS with Dr Burt Knack   Any Other Special Instructions Will Be Listed Below (If Applicable).  We will send referral to [facility] for cardiac rehabilitation.     If you need a refill on your cardiac medications before your next appointment, please call your pharmacy.

## 2015-10-07 NOTE — Telephone Encounter (Signed)
Medication list updated in the chart.  The pt is currently taking 0.5mg  daily.

## 2015-10-07 NOTE — Progress Notes (Signed)
Cardiology Office Note Date:  10/09/2015   ID:  ANDJELA WICKES, DOB 10/09/1941, MRN [id number]  PCP:  Jilda Panda, MD  Cardiologist:  Sherren Mocha, MD    Chief Complaint  Patient presents with  . Fatigue    History of Present Illness: Catherine [name] is a 74 y.o. female who presents for follow-up after undergoing TAVR September 29th, 2016 via an open transfemoral approach. The procedure was complicated by heart block requiring permanent pacemaker placement. She developed a hemothorax after pacemaker placement requiring chest tube placement. She was hospitalized at [facility] for overnight observation 09-24-2015 for evaluation of chest pain but no abnormalities were identified. A repeat echo at that time showed normal valve function and no evidence of pericardial effusion.   She is here with her husband today. Recovering slowly. Shortness of breath has resolved. Her activity level is slowly increasing. Continues to have fleeting chest pains on occasion. No leg swelling. No lightheadedness or syncope.    Past Medical History  Diagnosis Date  . Diabetes mellitus type II   . Thyroid disease   . IBS (irritable bowel syndrome)   . Anemia   . Colon polyps   . Fibromyalgia   . Rheumatoid arthritis(714.0)   . HTN (hypertension)   . Hypercholesteremia   . Acute colitis   . Rectal bleeding   . Stenosis of aortic valve   . Depression   . Shortness of breath dyspnea   . Cancer (HCC)     breast  . Hypothyroidism   . Chronic diastolic congestive heart failure ([place])     a. echo 08/31/2015: EF 65-70%, nl WM, GR1DD, Ao valve stent bioprosthesis was present and functioning nl, no regurg, LA mildly dilated, PASP 46 mm Hg  . S/P TAVR (transcatheter aortic valve replacement) 08/30/2015    26 mm Edwards Sapien 3 transcatheter heart valve placed via open right transfemoral approach  . CHB (complete heart block) ([place]) 08/31/2015    St Jude Medical Assurity DR model [id number] (serial number [id number] )   .  CAD (coronary artery disease)     a. heavy calcification of the entire LAD & mod eccentric mid LAD stenosis, estimated @ 70%, mild nonobs stenosis of RCA and LCx, severely calcified Ao valve with restricted valve mobility and 2+ AI      Past Surgical History  Procedure Laterality Date  . Self inflicted chest wound    . Polyp     . Total abdominal hysterectomy    . Cesarean section      x 2  . Left oophorectomy    . Abdominal surgery      Intestinal surgery  . Mastectomy Left   . Spine surgery  1981    lower back  . Transcatheter aortic valve replacement, transfemoral N/A 08/30/2015    Procedure: TRANSCATHETER AORTIC VALVE REPLACEMENT, TRANSFEMORAL;  Surgeon: Sherren Mocha, MD;  Location: [facility];  Service: Open Heart Surgery;  Laterality: N/A;  . Tee without cardioversion N/A 08/30/2015    Procedure: TRANSESOPHAGEAL ECHOCARDIOGRAM (TEE);  Surgeon: Sherren Mocha, MD;  Location: [facility];  Service: Open Heart Surgery;  Laterality: N/A;  . Ep implantable device N/A 08/31/2015    Procedure: Pacemaker Implant;  Surgeon: Will Meredith Leeds, MD; [place] (serial number [id number] ); Laterality: Right    Current Outpatient Prescriptions  Medication Sig Dispense Refill  . acetaminophen (TYLENOL) 325 MG tablet Take 2 tablets (650 mg total) by mouth every 6 (six) hours as needed  for mild pain (or Fever >/= 101). 30 tablet 0  . aspirin EC 81 MG tablet Take 81 mg by mouth daily.    Marland Kitchen atorvastatin (LIPITOR) 40 MG tablet Take 20 mg by mouth daily with lunch.    . Brexpiprazole (REXULTI) 0.5 MG TABS Take 0.5 mg by mouth daily.    . Cholecalciferol (VITAMIN D3) 2000 UNITS TABS Take 2,000 Units by mouth every morning.     . citalopram (CELEXA) 40 MG tablet Take 20 mg by mouth daily with lunch.     . clonazePAM (KLONOPIN) 1 MG tablet Take 1 mg by mouth at bedtime.     . clopidogrel (PLAVIX) 75 MG tablet Take 1 tablet (75 mg total) by mouth daily with breakfast. 30 tablet 11    . levothyroxine (SYNTHROID, LEVOTHROID) 100 MCG tablet Take 100 mcg by mouth daily.    . Linagliptin-Metformin HCl (JENTADUETO) 2.5-500 MG TABS Take 0.5 tablets by mouth daily with supper.     Marland Kitchen lisinopril (PRINIVIL,ZESTRIL) 10 MG tablet Take 1 tablet (10 mg total) by mouth daily. 30 tablet 6  . metoprolol tartrate (LOPRESSOR) 25 MG tablet Take 25 mg by mouth daily.     . Multiple Vitamin (MULTIVITAMIN WITH MINERALS) TABS tablet Take 1 tablet by mouth daily with lunch.    . pantoprazole (PROTONIX) 40 MG tablet Take 1 tablet (40 mg total) by mouth daily. 30 tablet 11  . potassium chloride SA (K-DUR,KLOR-CON) 10 MEQ tablet Take 1 tablet (10 mEq total) by mouth daily. 30 tablet 6  . tamsulosin (FLOMAX) 0.4 MG CAPS capsule Take 1 capsule (0.4 mg total) by mouth daily. 30 capsule 3  . traMADol (ULTRAM) 50 MG tablet Take 50 mg by mouth every 6 (six) hours as needed (pain).      No current facility-administered medications for this visit.    Allergies:   Sulfa antibiotics and Latex   Social History:  The patient  reports that she has never smoked. She does not have any smokeless tobacco history on file. She reports that she does not drink alcohol or use illicit drugs.   Family History:  The patient's  family history includes Depression in her sister, sister, and sister; Hypertension in her mother and sister; Lung cancer in her sister; Stroke in her sister. There is no history of Heart attack.    ROS:  Please see the history of present illness.  Otherwise, review of systems is positive for chest pain, diarrhea, depression, anxiety.  All other systems are reviewed and negative.    PHYSICAL EXAM: VS:  BP 120/58 mmHg  Pulse 69  Ht 5\' 2"  (1.575 m)  Wt 186 lb (84.369 kg)  BMI 34.01 kg/m2  SpO2 98% , BMI Body mass index is 34.01 kg/(m^2). GEN: Well nourished, well developed, pleasant elderly woman in no acute distress HEENT: normal Neck: no JVD, no masses.  Cardiac: RRR with 2/6 SEM at the RUSB                Respiratory:  clear to auscultation bilaterally, normal work of breathing GI: soft, nontender, nondistended, + BS MS: no deformity or atrophy Ext: no pretibial edema, pedal pulses 2+= bilaterally Skin: warm and dry, no rash Neuro:  Strength and sensation are intact Psych: euthymic mood, full affect  EKG:  EKG is not ordered today.  Recent Labs: 08/31/2015: Magnesium 1.9 09/24/2015: ALT 8*; BUN 18; Creatinine, Ser 0.67; Hemoglobin 11.1*; Platelets 230; Potassium 4.0; Sodium 138   Lipid Panel  No results  found for: CHOL, TRIG, HDL, CHOLHDL, VLDL, LDLCALC, LDLDIRECT    Wt Readings from Last 3 Encounters:  10/07/15 186 lb (84.369 kg)  09/24/15 181 lb (82.101 kg)  09/19/15 185 lb (83.915 kg)     Cardiac Studies Reviewed: 2D Echo 09-25-2015: Left ventricle: The cavity size was normal. Wall thickness was increased in a pattern of mild LVH. Doppler parameters are consistent with abnormal left ventricular relaxation (grade 1 diastolic dysfunction).  ------------------------------------------------------------------- Aortic valve: Pt is s/p TAVR. The prosthetic valve appears to be functioning properly . A bioprosthesis was present. Doppler: Peak velocity ratio of LVOT to aortic valve: 0.89. Valve area (Vmax): 3.37 cm^2. Indexed valve area (Vmax): 1.75 cm^2/m^2. Mean gradient (S): 9 mm Hg. Peak gradient (S): 15 mm Hg.  ------------------------------------------------------------------- Aorta: Aortic root: The aortic root was normal in size. Ascending aorta: The ascending aorta was normal in size.  ------------------------------------------------------------------- Mitral valve:  The valve appears to be grossly normal.  Doppler: There was no regurgitation.  Peak gradient (D): 4 mm Hg.  ------------------------------------------------------------------- Left atrium: The atrium was normal in  size.  ------------------------------------------------------------------- Right ventricle: The cavity size was normal. Systolic function was normal.  ------------------------------------------------------------------- Pulmonic valve:  The valve appears to be grossly normal. Doppler: There was no significant regurgitation.  ------------------------------------------------------------------- Tricuspid valve:  Structurally normal valve.  Leaflet separation was normal. Doppler: Transvalvular velocity was within the normal range. There was no significant regurgitation.  ------------------------------------------------------------------- Pericardium: A trivial pericardial effusion was identified.  ------------------------------------------------------------------- Systemic veins: Inferior vena cava: The vessel was normal in size. The respirophasic diameter changes were in the normal range (>= 50%), consistent with normal central venous pressure.   ASSESSMENT AND PLAN: 1.  Aortic valve disorder s/p TAVR: NYHA II sx's. Overall making slow progress. I think she would benefit from cardiac rehab - likely to help improve functional capacity. Continue ASA and plavix x 6 months. SBE prophylaxis guidelines reviewed.  2. Complete heart block: s/p PPM  Current medicines are reviewed with the patient today.  The patient does not have concerns regarding medicines.  Labs/ tests ordered today include:  No orders of the defined types were placed in this encounter.    Disposition:   FU 2 months  Signed, Sherren Mocha, MD  10/09/2015 10:04 PM    [address], [place], [place]  [address] Phone: [phone]; Fax: [phone]

## 2015-10-07 NOTE — Telephone Encounter (Signed)
New Message   Pt calling for cooper RN  She states that you want the new medication that she is taking   which is rexulti  0.5 mg 1st week and then increase to 1mg  the second week

## 2015-10-09 ENCOUNTER — Encounter: Payer: Self-pay | Admitting: Cardiovascular Disease

## 2015-10-20 ENCOUNTER — Telehealth: Payer: Self-pay | Admitting: Cardiovascular Disease

## 2015-10-20 NOTE — Telephone Encounter (Signed)
error 

## 2015-11-02 ENCOUNTER — Ambulatory Visit
Admission: RE | Admit: 2015-11-02 | Discharge: 2015-11-02 | Disposition: A | Discharge status: Home / Self Care | Payer: Medicare Other | Source: Ambulatory Visit | Attending: Internal Medicine | Admitting: Internal Medicine

## 2015-11-02 ENCOUNTER — Other Ambulatory Visit: Payer: Self-pay | Admitting: Internal Medicine

## 2015-11-02 DIAGNOSIS — M25511 Pain in right shoulder: Secondary | ICD-10-CM

## 2015-11-09 ENCOUNTER — Encounter: Payer: Self-pay | Admitting: Cardiology

## 2015-12-07 ENCOUNTER — Ambulatory Visit: Payer: Self-pay | Admitting: Cardiovascular Disease

## 2015-12-16 ENCOUNTER — Ambulatory Visit (INDEPENDENT_AMBULATORY_CARE_PROVIDER_SITE_OTHER): Payer: Medicare Other | Admitting: Cardiology

## 2015-12-16 ENCOUNTER — Encounter: Payer: Self-pay | Admitting: Cardiology

## 2015-12-16 VITALS — BP 130/70 | HR 72 | Ht 62.0 in | Wt 190.0 lb

## 2015-12-16 DIAGNOSIS — I442 Atrioventricular block, complete: Secondary | ICD-10-CM | POA: Diagnosis not present

## 2015-12-16 DIAGNOSIS — I350 Nonrheumatic aortic (valve) stenosis: Secondary | ICD-10-CM

## 2015-12-16 NOTE — Patient Instructions (Signed)
Medication Instructions:  Your physician recommends that you continue on your current medications as directed. Please refer to the Current Medication list given to you today.   Labwork: NONE ORDERED  Testing/Procedures: NONE ORDERED  Follow-Up: Your physician recommends that you schedule a follow-up appointment in: 4 MONTHS WITH DR. Burt Knack   Any Other Special Instructions Will Be Listed Below (If Applicable).    If you need a refill on your cardiac medications before your next appointment, please call your pharmacy.

## 2015-12-16 NOTE — Progress Notes (Signed)
Cardiology Office Note   Date:  12/16/2015   ID:  Catherine [name], DOB 03/28/1941, MRN [id number]  PCP:  Jilda Panda, MD  Cardiologist:  Dr. Burt Knack    Chief Complaint  Patient presents with  . Aortic Stenosis    s/p Tavr       History of Present Illness: Catherine [name] is a 75 y.o. female who presents for 2 month follow up with recent hx of TAVR.   She has a history of TAVR September 29th, 2016 via an open transfemoral approach. The procedure was complicated by heart block requiring permanent pacemaker placement. She developed a hemothorax after pacemaker placement requiring chest tube placement. She was hospitalized at [facility] for overnight observation 09-24-2015 for evaluation of chest pain but no abnormalities were identified. A repeat echo at that time showed normal valve function and no evidence of pericardial effusion.   She was going to attend cardiac rehab, but decided not to go.  She feels she is back to her baseline.  She still has brief but rare shooting chest discomfort.  She denies SOB. She has no swelling.  She was somewhat slow walking out of the room.   Past Medical History  Diagnosis Date  . Diabetes mellitus type II   . Thyroid disease   . IBS (irritable bowel syndrome)   . Anemia   . Colon polyps   . Fibromyalgia   . Rheumatoid arthritis(714.0)   . HTN (hypertension)   . Hypercholesteremia   . Acute colitis   . Rectal bleeding   . Stenosis of aortic valve   . Depression   . Shortness of breath dyspnea   . Cancer (HCC)     breast  . Hypothyroidism   . Chronic diastolic congestive heart failure ([place])     a. echo 08/31/2015: EF 65-70%, nl WM, GR1DD, Ao valve stent bioprosthesis was present and functioning nl, no regurg, LA mildly dilated, PASP 46 mm Hg  . S/P TAVR (transcatheter aortic valve replacement) 08/30/2015    26 mm Edwards Sapien 3 transcatheter heart valve placed via open right transfemoral approach  . CHB (complete heart block) ([place]) 08/31/2015    St Jude Medical Assurity DR model [id number] (serial number [id number] )   . CAD (coronary artery disease)     a. heavy calcification of the entire LAD & mod eccentric mid LAD stenosis, estimated @ 70%, mild nonobs stenosis of RCA and LCx, severely calcified Ao valve with restricted valve mobility and 2+ AI      Past Surgical History  Procedure Laterality Date  . Self inflicted chest wound    . Polyp     . Total abdominal hysterectomy    . Cesarean section      x 2  . Left oophorectomy    . Abdominal surgery      Intestinal surgery  . Mastectomy Left   . Spine surgery  1981    lower back  . Transcatheter aortic valve replacement, transfemoral N/A 08/30/2015    Procedure: TRANSCATHETER AORTIC VALVE REPLACEMENT, TRANSFEMORAL;  Surgeon: Sherren Mocha, MD;  Location: [place];  Service: Open Heart Surgery;  Laterality: N/A;  . Tee without cardioversion N/A 08/30/2015    Procedure: TRANSESOPHAGEAL ECHOCARDIOGRAM (TEE);  Surgeon: Sherren Mocha, MD;  Location: [place];  Service: Open Heart Surgery;  Laterality: N/A;  . Ep implantable device N/A 08/31/2015    Procedure: Pacemaker Implant;  Surgeon: Will Meredith Leeds, MD; [place] (serial number [id number] );  Laterality: Right     Current Outpatient Prescriptions  Medication Sig Dispense Refill  . acetaminophen (TYLENOL) 325 MG tablet Take 2 tablets (650 mg total) by mouth every 6 (six) hours as needed for mild pain (or Fever >/= 101). 30 tablet 0  . aspirin EC 81 MG tablet Take 81 mg by mouth daily.    Marland Kitchen atorvastatin (LIPITOR) 40 MG tablet Take 20 mg by mouth daily with lunch.    . citalopram (CELEXA) 40 MG tablet Take 20 mg by mouth daily with lunch.     . clonazePAM (KLONOPIN) 1 MG tablet Take 1 mg by mouth at bedtime.     . clopidogrel (PLAVIX) 75 MG tablet Take 1 tablet (75 mg total) by mouth daily with breakfast. 30 tablet 11  . levothyroxine (SYNTHROID, LEVOTHROID) 100 MCG tablet Take 100 mcg by mouth daily.      . Linagliptin-Metformin HCl (JENTADUETO) 2.5-500 MG TABS Take 0.5 tablets by mouth daily with supper.     Marland Kitchen lisinopril (PRINIVIL,ZESTRIL) 10 MG tablet Take 1 tablet (10 mg total) by mouth daily. 30 tablet 6  . metoprolol tartrate (LOPRESSOR) 25 MG tablet Take 25 mg by mouth daily.     . Multiple Vitamin (MULTIVITAMIN WITH MINERALS) TABS tablet Take 1 tablet by mouth daily with lunch.    . pantoprazole (PROTONIX) 40 MG tablet Take 1 tablet (40 mg total) by mouth daily. 30 tablet 11  . potassium chloride SA (K-DUR,KLOR-CON) 10 MEQ tablet Take 1 tablet (10 mEq total) by mouth daily. 30 tablet 6  . tamsulosin (FLOMAX) 0.4 MG CAPS capsule Take 1 capsule (0.4 mg total) by mouth daily. 30 capsule 3  . traMADol (ULTRAM) 50 MG tablet Take 50 mg by mouth every 6 (six) hours as needed (pain).      No current facility-administered medications for this visit.    Allergies:   Sulfa antibiotics and Latex    Social History:  The patient  reports that she has never smoked. She does not have any smokeless tobacco history on file. She reports that she does not drink alcohol or use illicit drugs.    ROS:  General:no colds or fevers,  weight is up 4 lbs but no edema, walks with a cane Skin:no rashes or ulcers HEENT:no blurred vision, no congestion CV:see HPI PUL:see HPI GI:no diarrhea constipation or melena, no indigestion GU:no hematuria, no dysuria MS:no joint pain, no claudication Neuro:no syncope, no lightheadedness Endo:no diabetes, no thyroid disease  Wt Readings from Last 3 Encounters:  12/16/15 190 lb (86.183 kg)  10/07/15 186 lb (84.369 kg)  09/24/15 181 lb (82.101 kg)     PHYSICAL EXAM: VS:  BP 130/70 mmHg  Pulse 72  Ht 5\' 2"  (1.575 m)  Wt 190 lb (86.183 kg)  BMI 34.74 kg/m2 , BMI Body mass index is 34.74 kg/(m^2). General:Pleasant affect, NAD Skin:Warm and dry, brisk capillary refill HEENT:normocephalic, sclera clear, mucus membranes moist Neck:supple, no JVD, no bruits   Heart:S1S2 RRR with soft systolic murmur, no gallup, rub or click Lungs:clear without rales, rhonchi, or wheezes [id number], non tender, + BS, do not palpate liver spleen or masses Ext:no lower ext edema, 2+ pedal pulses, 2+ radial pulses Neuro:alert and oriented, MAE, follows commands, + facial symmetry    EKG:  EKG is NOT ordered today.    Recent Labs: 08/31/2015: Magnesium 1.9 09/24/2015: ALT 8*; BUN 18; Creatinine, Ser 0.67; Hemoglobin 11.1*; Platelets 230; Potassium 4.0; Sodium 138    Lipid Panel No results found for: CHOL,  TRIG, HDL, CHOLHDL, VLDL, LDLCALC, LDLDIRECT     Other studies Reviewed: Additional studies/ records that were reviewed today include: previous notes and echo. .   ASSESSMENT AND PLAN:  1. Aortic valve disorder s/p TAVR: NYHA II sx's. Overall making slow progress. She would have benefited  from cardiac rehab - but she did not wish to participate.   Continue ASA and plavix x 6 months.  Continue SBE prophylaxis.  2. Complete heart block: s/p PPM- to see Dr. Curt Bears on the 23rd.     Current medicines are reviewed with the patient today.  The patient Has no concerns regarding medicines.  The following changes have been made:  See above Labs/ tests ordered today include:see above  Disposition:   FU:  see above  Signed, Isaiah Serge, NP  12/16/2015 4:21 PM    [facility] [place], [place], [place], [place] Phone: [phone]; Fax: [phone]

## 2015-12-19 ENCOUNTER — Encounter: Payer: Self-pay | Admitting: Cardiology

## 2015-12-26 ENCOUNTER — Encounter: Payer: Self-pay | Admitting: Cardiology

## 2015-12-26 ENCOUNTER — Ambulatory Visit (INDEPENDENT_AMBULATORY_CARE_PROVIDER_SITE_OTHER): Payer: Medicare Other | Admitting: Cardiology

## 2015-12-26 VITALS — BP 124/76 | HR 74 | Ht 62.0 in | Wt 191.2 lb

## 2015-12-26 DIAGNOSIS — I442 Atrioventricular block, complete: Secondary | ICD-10-CM | POA: Diagnosis not present

## 2015-12-26 LAB — CUP PACEART INCLINIC DEVICE CHECK
Brady Statistic RA Percent Paced: 2.7 %
Brady Statistic RV Percent Paced: 15 %
Implantable Lead Implant Date: 20160928
Lead Channel Impedance Value: 537.5 Ohm
Lead Channel Pacing Threshold Amplitude: 0.75 V
Lead Channel Pacing Threshold Pulse Width: 0.4 ms
Lead Channel Pacing Threshold Pulse Width: 0.4 ms
Lead Channel Sensing Intrinsic Amplitude: 11.1 mV
Lead Channel Setting Pacing Amplitude: 0.875
Lead Channel Setting Sensing Sensitivity: 4 mV
MDC IDC LEAD IMPLANT DT: 20160928
MDC IDC LEAD LOCATION: 753859
MDC IDC LEAD LOCATION: 753860
MDC IDC MSMT BATTERY REMAINING LONGEVITY: 128.4
MDC IDC MSMT BATTERY VOLTAGE: 3.01 V
MDC IDC MSMT LEADCHNL RA IMPEDANCE VALUE: 425 Ohm
MDC IDC MSMT LEADCHNL RA PACING THRESHOLD AMPLITUDE: 0.75 V
MDC IDC MSMT LEADCHNL RA PACING THRESHOLD AMPLITUDE: 0.75 V
MDC IDC MSMT LEADCHNL RA PACING THRESHOLD PULSEWIDTH: 0.4 ms
MDC IDC MSMT LEADCHNL RA SENSING INTR AMPL: 2.9 mV
MDC IDC MSMT LEADCHNL RV PACING THRESHOLD AMPLITUDE: 0.75 V
MDC IDC MSMT LEADCHNL RV PACING THRESHOLD PULSEWIDTH: 0.4 ms
MDC IDC SESS DTM: 20170123152124
MDC IDC SET LEADCHNL RA PACING AMPLITUDE: 2 V
MDC IDC SET LEADCHNL RV PACING PULSEWIDTH: 0.4 ms
Pulse Gen Model: 2240
Pulse Gen Serial Number: 7779295

## 2015-12-26 NOTE — Addendum Note (Signed)
Addended by: Domenica Reamer R on: 12/26/2015 03:19 PM   Modules accepted: Orders

## 2015-12-26 NOTE — Progress Notes (Signed)
Electrophysiology Office Note   Date:  12/26/2015   ID:  Catherine [name], DOB 1941/07/25, MRN [id number]  PCP:  Catherine Panda, MD  Cardiologist:  Catherine [name] Primary Electrophysiologist:  Catherine [name] Catherine Leeds, MD    No chief complaint on file.    History of Present Illness: Catherine [name] is a 75 y.o. female who presents today for electrophysiology evaluation.   She has history of type 2 diabetes, hypertension,coronary artery disease, aortic stenosis status TAVR, and complete heart block post TAVR placement, located by pneumothorax. She presents today as a 3 month follow-up from her pacemaker placement.   Today, she denies symptoms of palpitations, chest pain, shortness of breath, orthopnea, PND, lower extremity edema, claudication, dizziness, presyncope, syncope, bleeding, or neurologic sequela. The patient is tolerating medications without difficulties and is otherwise without complaint today.    Past Medical History  Diagnosis Date  . Diabetes mellitus type II   . Thyroid disease   . IBS (irritable bowel syndrome)   . Anemia   . Colon polyps   . Fibromyalgia   . Rheumatoid arthritis(714.0)   . HTN (hypertension)   . Hypercholesteremia   . Acute colitis   . Rectal bleeding   . Stenosis of aortic valve   . Depression   . Shortness of breath dyspnea   . Cancer (HCC)     breast  . Hypothyroidism   . Chronic diastolic congestive heart failure ([place])     a. echo 08/31/2015: EF 65-70%, nl WM, GR1DD, Ao valve stent bioprosthesis was present and functioning nl, no regurg, LA mildly dilated, PASP 46 mm Hg  . S/P TAVR (transcatheter aortic valve replacement) 08/30/2015    26 mm Edwards Sapien 3 transcatheter heart valve placed via open right transfemoral approach  . CHB (complete heart block) ([place]) 08/31/2015    St Jude Medical Assurity DR model [id number] (serial number [id number] )   . CAD (coronary artery disease)     a. heavy calcification of the entire LAD & mod eccentric mid LAD  stenosis, estimated @ 70%, mild nonobs stenosis of RCA and LCx, severely calcified Ao valve with restricted valve mobility and 2+ AI     Past Surgical History  Procedure Laterality Date  . Self inflicted chest wound    . Polyp     . Total abdominal hysterectomy    . Cesarean section      x 2  . Left oophorectomy    . Abdominal surgery      Intestinal surgery  . Mastectomy Left   . Spine surgery  1981    lower back  . Transcatheter aortic valve replacement, transfemoral N/A 08/30/2015    Procedure: TRANSCATHETER AORTIC VALVE REPLACEMENT, TRANSFEMORAL;  Surgeon: Sherren Mocha, MD;  Location: [place];  Service: Open Heart Surgery;  Laterality: N/A;  . Tee without cardioversion N/A 08/30/2015    Procedure: TRANSESOPHAGEAL ECHOCARDIOGRAM (TEE);  Surgeon: Sherren Mocha, MD;  Location: [place];  Service: Open Heart Surgery;  Laterality: N/A;  . Ep implantable device N/A 08/31/2015    Procedure: Pacemaker Implant;  Surgeon: Keli Buehner Catherine Leeds, MD; [place] (serial number [id number] ); Laterality: Right     Current Outpatient Prescriptions  Medication Sig Dispense Refill  . acetaminophen (TYLENOL) 325 MG tablet Take 2 tablets (650 mg total) by mouth every 6 (six) hours as needed for mild pain (or Fever >/= 101). 30 tablet 0  . aspirin EC 81 MG tablet Take 81 mg by mouth daily.    Marland Kitchen  atorvastatin (LIPITOR) 40 MG tablet Take 20 mg by mouth daily with lunch.    . citalopram (CELEXA) 40 MG tablet Take 20 mg by mouth daily with lunch.     . clonazePAM (KLONOPIN) 1 MG tablet Take 1 mg by mouth at bedtime.     . clopidogrel (PLAVIX) 75 MG tablet Take 1 tablet (75 mg total) by mouth daily with breakfast. 30 tablet 11  . levothyroxine (SYNTHROID, LEVOTHROID) 100 MCG tablet Take 100 mcg by mouth daily.    . Linagliptin-Metformin HCl (JENTADUETO) 2.5-500 MG TABS Take 0.5 tablets by mouth daily with supper.     Marland Kitchen lisinopril (PRINIVIL,ZESTRIL) 10 MG tablet Take 1 tablet (10 mg  total) by mouth daily. 30 tablet 6  . metoprolol tartrate (LOPRESSOR) 25 MG tablet Take 25 mg by mouth daily.     . Multiple Vitamin (MULTIVITAMIN WITH MINERALS) TABS tablet Take 1 tablet by mouth daily with lunch.    . pantoprazole (PROTONIX) 40 MG tablet Take 1 tablet (40 mg total) by mouth daily. 30 tablet 11  . potassium chloride SA (K-DUR,KLOR-CON) 10 MEQ tablet Take 1 tablet (10 mEq total) by mouth daily. 30 tablet 6  . tamsulosin (FLOMAX) 0.4 MG CAPS capsule Take 1 capsule (0.4 mg total) by mouth daily. 30 capsule 3  . traMADol (ULTRAM) 50 MG tablet Take 50 mg by mouth every 6 (six) hours as needed (pain).      No current facility-administered medications for this visit.    Allergies:   Sulfa antibiotics and Latex   Social History:  The patient  reports that she has never smoked. She does not have any smokeless tobacco history on file. She reports that she does not drink alcohol or use illicit drugs.   Family History:  The patient's family history includes Depression in her sister, sister, and sister; Hypertension in her mother and sister; Lung cancer in her sister; Stroke in her sister. There is no history of Heart attack.    ROS:  Please see the history of present illness.   Otherwise, review of systems is positive for anxiety, depression.   All other systems are reviewed and negative.    PHYSICAL EXAM: VS:  There were no vitals taken for this visit. , BMI There is no weight on file to calculate BMI. GEN: Well nourished, well developed, in no acute distress HEENT: normal Neck: no JVD, carotid bruits, or masses Cardiac: RRR; no murmurs, rubs, or gallops,no edema  Respiratory:  clear to auscultation bilaterally, normal work of breathing GI: soft, nontender, nondistended, + BS MS: no deformity or atrophy Skin: warm and dry,  device pocket is well healed Neuro:  Strength and sensation are intact Psych: euthymic mood, full affect  EKG:  EKG is ordered today. The ekg ordered  today shows sinus rhythm, RBBB, LAFB   Device interrogation is reviewed today in detail.  See PaceArt for details.   Recent Labs: 08/31/2015: Magnesium 1.9 09/24/2015: ALT 8*; BUN 18; Creatinine, Ser 0.67; Hemoglobin 11.1*; Platelets 230; Potassium 4.0; Sodium 138    Lipid Panel  No results found for: CHOL, TRIG, HDL, CHOLHDL, VLDL, LDLCALC, LDLDIRECT   Wt Readings from Last 3 Encounters:  12/16/15 190 lb (86.183 kg)  10/07/15 186 lb (84.369 kg)  09/24/15 181 lb (82.101 kg)      Other studies Reviewed: Additional studies/ records that were reviewed today include: TTE 09/25/15  Review of the above records today demonstrates:  - Left ventricle: The cavity size was normal. Wall thickness  was increased in a pattern of mild LVH. Doppler parameters are consistent with abnormal left ventricular relaxation (grade 1 diastolic dysfunction). - Aortic valve: A bioprosthesis was present. Valve area (Vmax): 3.37 cm^2. - Pericardium, extracardiac: A trivial pericardial effusion was identified.   ASSESSMENT AND PLAN:  1.  Complete heart block:she now has conduction back and is pacing approximately 11-12% of the time in the ventricle. We have turned on the  VIP so as to hopefully decrease the amount that she paces in the ventricle. Otherwise she needs no changes to her device.  2. Aortic stenosis s/p TAVR: doing well without any complaints. She has no chest pain or heart failure symptoms. We'll continue to monitor.    Current medicines are reviewed at length with the patient today.   The patient does not have concerns regarding her medicines.  The following changes were made today:  none  Labs/ tests ordered today include:  No orders of the defined types were placed in this encounter.     Disposition:   FU with Adreonna Yontz 9 months  Signed, Teliyah Royal Catherine Leeds, MD  12/26/2015 2:03 PM     [place] [address]  [address] [phone] (office) [phone] (fax)

## 2015-12-26 NOTE — Patient Instructions (Signed)
Medication Instructions:  Your physician recommends that you continue on your current medications as directed. Please refer to the Current Medication list given to you today.  Labwork: None ordered  Testing/Procedures: None ordered  Follow-Up: Remote monitoring is used to monitor your Pacemaker of ICD from home. This monitoring reduces the number of office visits required to check your device to one time per year. It allows [place] to keep an eye on the functioning of your device to ensure it is working properly. You are scheduled for a device check from home on 03/26/16. You may send your transmission at any time that day. If you have a wireless device, the transmission will be sent automatically. After your physician reviews your transmission, you will receive a postcard with your next transmission date.  Your physician wants you to follow-up in: 9 months with Dr. Curt Bears.  You will receive a reminder letter in the mail two months in advance. If you don't receive a letter, please call our office to schedule the follow-up appointment.  Any Other Special Instructions Will Be Listed Below (If Applicable).  If you need a refill on your cardiac medications before your next appointment, please call your pharmacy.  Thank you for choosing [facility]!!   Trinidad Curet, RN [phone]

## 2015-12-28 NOTE — Addendum Note (Signed)
Addended by: Freada Bergeron on: 12/28/2015 05:49 PM   Modules accepted: Orders

## 2016-02-24 ENCOUNTER — Emergency Department
Admission: EM | Admit: 2016-02-24 | Discharge: 2016-02-24 | Disposition: A | Discharge status: Home / Self Care | Payer: Medicare Other | Attending: Emergency Medicine | Admitting: Emergency Medicine

## 2016-02-24 ENCOUNTER — Emergency Department: Payer: Medicare Other

## 2016-02-24 ENCOUNTER — Encounter: Payer: Self-pay | Admitting: Emergency Medicine

## 2016-02-24 DIAGNOSIS — K589 Irritable bowel syndrome without diarrhea: Secondary | ICD-10-CM | POA: Insufficient documentation

## 2016-02-24 DIAGNOSIS — Z952 Presence of prosthetic heart valve: Secondary | ICD-10-CM | POA: Diagnosis not present

## 2016-02-24 DIAGNOSIS — I110 Hypertensive heart disease with heart failure: Secondary | ICD-10-CM | POA: Insufficient documentation

## 2016-02-24 DIAGNOSIS — E782 Mixed hyperlipidemia: Secondary | ICD-10-CM | POA: Insufficient documentation

## 2016-02-24 DIAGNOSIS — I5032 Chronic diastolic (congestive) heart failure: Secondary | ICD-10-CM | POA: Insufficient documentation

## 2016-02-24 DIAGNOSIS — I2510 Atherosclerotic heart disease of native coronary artery without angina pectoris: Secondary | ICD-10-CM | POA: Diagnosis not present

## 2016-02-24 DIAGNOSIS — Z7984 Long term (current) use of oral hypoglycemic drugs: Secondary | ICD-10-CM | POA: Insufficient documentation

## 2016-02-24 DIAGNOSIS — Z7982 Long term (current) use of aspirin: Secondary | ICD-10-CM | POA: Diagnosis not present

## 2016-02-24 DIAGNOSIS — I442 Atrioventricular block, complete: Secondary | ICD-10-CM | POA: Diagnosis not present

## 2016-02-24 DIAGNOSIS — F410 Panic disorder [episodic paroxysmal anxiety] without agoraphobia: Secondary | ICD-10-CM

## 2016-02-24 DIAGNOSIS — M069 Rheumatoid arthritis, unspecified: Secondary | ICD-10-CM | POA: Diagnosis not present

## 2016-02-24 DIAGNOSIS — I350 Nonrheumatic aortic (valve) stenosis: Secondary | ICD-10-CM | POA: Diagnosis not present

## 2016-02-24 DIAGNOSIS — M797 Fibromyalgia: Secondary | ICD-10-CM | POA: Insufficient documentation

## 2016-02-24 DIAGNOSIS — R410 Disorientation, unspecified: Secondary | ICD-10-CM | POA: Insufficient documentation

## 2016-02-24 DIAGNOSIS — F419 Anxiety disorder, unspecified: Secondary | ICD-10-CM | POA: Insufficient documentation

## 2016-02-24 DIAGNOSIS — Z853 Personal history of malignant neoplasm of breast: Secondary | ICD-10-CM | POA: Diagnosis not present

## 2016-02-24 DIAGNOSIS — E039 Hypothyroidism, unspecified: Secondary | ICD-10-CM | POA: Insufficient documentation

## 2016-02-24 DIAGNOSIS — E119 Type 2 diabetes mellitus without complications: Secondary | ICD-10-CM | POA: Insufficient documentation

## 2016-02-24 DIAGNOSIS — R4182 Altered mental status, unspecified: Secondary | ICD-10-CM | POA: Diagnosis present

## 2016-02-24 DIAGNOSIS — Z79899 Other long term (current) drug therapy: Secondary | ICD-10-CM | POA: Insufficient documentation

## 2016-02-24 DIAGNOSIS — F329 Major depressive disorder, single episode, unspecified: Secondary | ICD-10-CM | POA: Diagnosis not present

## 2016-02-24 DIAGNOSIS — K635 Polyp of colon: Secondary | ICD-10-CM | POA: Insufficient documentation

## 2016-02-24 LAB — CBC
HCT: 38.7 % (ref 35.0–47.0)
Hemoglobin: 12.9 g/dL (ref 12.0–16.0)
MCH: 26.4 pg (ref 26.0–34.0)
MCHC: 33.2 g/dL (ref 32.0–36.0)
MCV: 79.4 fL — AB (ref 80.0–100.0)
Platelets: 252 10*3/uL (ref 150–440)
RBC: 4.88 MIL/uL (ref 3.80–5.20)
RDW: 16.9 % — AB (ref 11.5–14.5)
WBC: 9.4 10*3/uL (ref 3.6–11.0)

## 2016-02-24 LAB — URINALYSIS COMPLETE WITH MICROSCOPIC (ARMC ONLY)
BILIRUBIN URINE: NEGATIVE
GLUCOSE, UA: NEGATIVE mg/dL
HGB URINE DIPSTICK: NEGATIVE
Ketones, ur: NEGATIVE mg/dL
LEUKOCYTES UA: NEGATIVE
NITRITE: NEGATIVE
Protein, ur: NEGATIVE mg/dL
Specific Gravity, Urine: 1.013 (ref 1.005–1.030)
pH: 8 (ref 5.0–8.0)

## 2016-02-24 LAB — COMPREHENSIVE METABOLIC PANEL
ALBUMIN: 4.1 g/dL (ref 3.5–5.0)
ALT: 9 U/L — AB (ref 14–54)
AST: 18 U/L (ref 15–41)
Alkaline Phosphatase: 66 U/L (ref 38–126)
Anion gap: 7 (ref 5–15)
BUN: 11 mg/dL (ref 6–20)
CHLORIDE: 102 mmol/L (ref 101–111)
CO2: 28 mmol/L (ref 22–32)
CREATININE: 0.59 mg/dL (ref 0.44–1.00)
Calcium: 9.5 mg/dL (ref 8.9–10.3)
GFR calc non Af Amer: >60 mL/min (ref >60)
GLUCOSE: 100 mg/dL — AB (ref 65–99)
Potassium: 3.9 mmol/L (ref 3.5–5.1)
SODIUM: 137 mmol/L (ref 135–145)
Total Bilirubin: 0.7 mg/dL (ref 0.3–1.2)
Total Protein: 7.4 g/dL (ref 6.5–8.1)

## 2016-02-24 NOTE — ED Notes (Signed)
fam says pt had a spell today.  She did not know where she was when [facility].  husb says pt is n ormally confused in am, but usually gets better through the day.  Pt in nad.  Talking not slurred.  nad.

## 2016-02-24 NOTE — ED Provider Notes (Signed)
[facility] Emergency Department Provider Note     Time seen: ----------------------------------------- 1:01 PM on 02/24/2016 -----------------------------------------    I have reviewed the triage vital signs and the nursing notes.   HISTORY  Chief Complaint Altered Mental Status    HPI Catherine [name] is a 75 y.o. female who was going through the [employer] drive-through with her husband and subsequently had an episode where she was disoriented and did not recognize the street they were driving on. This lasted about a minute and subsequently resolved. Other than the transient confusion she has not had any numbness tingling weakness, speech trouble or facial paralysis. Patient denies history of this before, feels completely back to normal. Family was concerned so they brought her to the ER for evaluation. Prior to this episode she was short of breath and shaky and possibly anxious.   Past Medical History  Diagnosis Date  . Diabetes mellitus type II   . Thyroid disease   . IBS (irritable bowel syndrome)   . Anemia   . Colon polyps   . Fibromyalgia   . Rheumatoid arthritis(714.0)   . HTN (hypertension)   . Hypercholesteremia   . Acute colitis   . Rectal bleeding   . Stenosis of aortic valve   . Depression   . Shortness of breath dyspnea   . Cancer (HCC)     breast  . Hypothyroidism   . Chronic diastolic congestive heart failure ([place])     a. echo 08/31/2015: EF 65-70%, nl WM, GR1DD, Ao valve stent bioprosthesis was present and functioning nl, no regurg, LA mildly dilated, PASP 46 mm Hg  . S/P TAVR (transcatheter aortic valve replacement) 08/30/2015    26 mm Edwards Sapien 3 transcatheter heart valve placed via open right transfemoral approach  . CHB (complete heart block) ([place]) 08/31/2015    [employer] Assurity DR model [id number] (serial number [id number] )   . CAD (coronary artery disease)     a. heavy calcification of the entire LAD & mod eccentric mid  LAD stenosis, estimated @ 70%, mild nonobs stenosis of RCA and LCx, severely calcified Ao valve with restricted valve mobility and 2+ AI      Patient Active Problem List   Diagnosis Date Noted  . Chest pain 09/24/2015  . Hypokalemia 09/07/2015  . IBS (irritable bowel syndrome)   . Cancer ([place])   . Pneumothorax 09/05/2015  . Complete heart block ([place])   . S/P TAVR (transcatheter aortic valve replacement) 08/30/2015  . Type II diabetes mellitus ([place])   . Essential hypertension   . Chronic diastolic congestive heart failure ([place])   . Rheumatoid arthritis ([place])   . Hypothyroidism   . Fibromyalgia   . Aortic valve stenosis, critical 08/22/2015  . MDD (major depressive disorder) ([place]) 05/26/2012    Past Surgical History  Procedure Laterality Date  . Self inflicted chest wound    . Polyp     . Total abdominal hysterectomy    . Cesarean section      x 2  . Left oophorectomy    . Abdominal surgery      Intestinal surgery  . Mastectomy Left   . Spine surgery  1981    lower back  . Transcatheter aortic valve replacement, transfemoral N/A 08/30/2015    Procedure: TRANSCATHETER AORTIC VALVE REPLACEMENT, TRANSFEMORAL;  Surgeon: Sherren Mocha, MD;  Location: [place];  Service: Open Heart Surgery;  Laterality: N/A;  . Tee without cardioversion N/A 08/30/2015  Procedure: TRANSESOPHAGEAL ECHOCARDIOGRAM (TEE);  Surgeon: Sherren Mocha, MD;  Location: [place];  Service: Open Heart Surgery;  Laterality: N/A;  . Ep implantable device N/A 08/31/2015    Procedure: Pacemaker Implant;  Surgeon: Will Meredith Leeds, MD; [place] (serial number [id number] ); Laterality: Right  . Cardiac surgery      Allergies Sulfa antibiotics and Latex  Social History Social History  Substance Use Topics  . Smoking status: Never Smoker   . Smokeless tobacco: None  . Alcohol Use: No    Review of Systems Constitutional: Negative for fever. Eyes: Negative for visual  changes. ENT: Negative for sore throat. Cardiovascular: Negative for chest pain. Respiratory: Negative for shortness of breath. Gastrointestinal: Negative for abdominal pain, vomiting and diarrhea. Genitourinary: Negative for dysuria. Musculoskeletal: Negative for back pain. Skin: Negative for rash. Neurological: Negative for headaches, focal weakness or numbness.  10-point ROS otherwise negative.  ____________________________________________   PHYSICAL EXAM:  VITAL SIGNS: ED Triage Vitals  Enc Vitals Group     BP 02/24/16 1236 160/90 mmHg     Pulse Rate 02/24/16 1236 65     Resp 02/24/16 1236 16     Temp 02/24/16 1236 98 F (36.7 C)     Temp src --      SpO2 --      Weight 02/24/16 1236 185 lb (83.915 kg)     Height 02/24/16 1236 5\' 2"  (1.575 m)     Head Cir --      Peak Flow --      Pain Score --      Pain Loc --      Pain Edu? --      Excl. in [place]? --     Constitutional: Alert and oriented. Well appearing and in no distress. Eyes: Conjunctivae are normal. PERRL. Normal extraocular movements. ENT   Head: Normocephalic and atraumatic.   Nose: No congestion/rhinnorhea.   Mouth/Throat: Mucous membranes are moist.   Neck: No stridor. Cardiovascular: Normal rate, regular rhythm. Normal and symmetric distal pulses are present in all extremities. No murmurs, rubs, or gallops. Respiratory: Normal respiratory effort without tachypnea nor retractions. Breath sounds are clear and equal bilaterally. No wheezes/rales/rhonchi. Gastrointestinal: Soft and nontender. No distention. No abdominal bruits.  Musculoskeletal: Nontender with normal range of motion in all extremities. No joint effusions.  No lower extremity tenderness nor edema. Neurologic:  Normal speech and language. No gross focal neurologic deficits are appreciated. No motor or sensory deficits are appreciated, finger to nose test is normal. Skin:  Skin is warm, dry and intact. No rash noted. Psychiatric:  Mood and affect are normal. Speech and behavior are normal. Patient exhibits appropriate insight and judgment. ____________________________________________  EKG: Interpreted by me. Normal sinus rhythm with a rate of 65 bpm, normal PR interval, wide QRS, normal QT interval. Bundle branch block.  ____________________________________________  ED COURSE:  Pertinent labs & imaging results that were available during my care of the patient were reviewed by me and considered in my medical decision making (see chart for details). Patient is in no acute distress, possible anxiety attack. I will check basic labs and head CT. ____________________________________________    LABS (pertinent positives/negatives)  Labs Reviewed  COMPREHENSIVE METABOLIC PANEL - Abnormal; Notable for the following:    Glucose, Bld 100 (*)    ALT 9 (*)    All other components within normal limits  CBC - Abnormal; Notable for the following:    MCV 79.4 (*)  RDW 16.9 (*)    All other components within normal limits  URINALYSIS COMPLETEWITH MICROSCOPIC ([facility] ONLY) - Abnormal; Notable for the following:    Color, Urine YELLOW (*)    APPearance CLEAR (*)    Bacteria, UA RARE (*)    Squamous Epithelial / LPF 0-5 (*)    All other components within normal limits  CBG MONITORING, ED    RADIOLOGY  CT head IMPRESSION: Chronic changes as described.  No acute intracranial findings. ____________________________________________  FINAL ASSESSMENT AND PLAN  Confusion, possible anxiety attack  Plan: Patient with labs and imaging as dictated above. Patient is in no acute distress, has been observed in the ER without any worsening of her symptoms. She does take aspirin and Plavix, I doubt this was a TIA. I will advise close follow-up with her doctor for reevaluation.   Earleen Newport, MD   Earleen Newport, MD 02/24/16 [phone]

## 2016-02-24 NOTE — Discharge Instructions (Signed)
Confusion Confusion is the inability to think with your usual speed or clarity. Confusion may come on quickly or slowly over time. How quickly the confusion comes on depends on the cause. Confusion can be due to any number of causes. CAUSES   Concussion, head injury, or head trauma.  Seizures.  Stroke.  Fever.  Brain tumor.  Age related decreased brain function (dementia).  Heightened emotional states like rage or terror.  Mental illness in which the person loses the ability to determine what is real and what is not (hallucinations).  Infections such as a urinary tract infection (UTI).  Toxic effects from alcohol, drugs, or prescription medicines.  Dehydration and an imbalance of salts in the body (electrolytes).  Lack of sleep.  Low blood sugar (diabetes).  Low levels of oxygen from conditions such as chronic lung disorders.  Drug interactions or other medicine side effects.  Nutritional deficiencies, especially niacin, thiamine, vitamin C, or vitamin B.  Sudden drop in body temperature (hypothermia).  Change in routine, such as when traveling or hospitalized. SIGNS AND SYMPTOMS  People often describe their thinking as cloudy or unclear when they are confused. Confusion can also include feeling disoriented. That means you are unaware of where or who you are. You may also not know what the date or time is. If confused, you may also have difficulty paying attention, remembering, and making decisions. Some people also act aggressively when they are confused.  DIAGNOSIS  The medical evaluation of confusion may include:  Blood and urine tests.  X-rays.  Brain and nervous system tests.  Analyzing your brain waves (electroencephalogram or EEG).  Magnetic resonance imaging (MRI) of your head.  Computed tomography (CT) scan of your head.  Mental status tests in which your health care provider may ask many questions. Some of these questions may seem silly or strange,  but they are a very important test to help diagnose and treat confusion. TREATMENT  An admission to the hospital may not be needed, but a person with confusion should not be left alone. Stay with a family member or friend until the confusion clears. Avoid alcohol, pain relievers, or sedative drugs until you have fully recovered. Do not drive until directed by your health care provider. HOME CARE INSTRUCTIONS  What family and friends can do:  To find out if someone is confused, ask the person to state his or her name, age, and the date. If the person is unsure or answers incorrectly, he or she is confused.  Always introduce yourself, no matter how well the person knows you.  Often remind the person of his or her location.  Place a calendar and clock near the confused person.  Help the person with his or her medicines. You may want to use a pill box, an alarm as a reminder, or give the person each dose as prescribed.  Talk about current events and plans for the day.  Try to keep the environment calm, quiet, and peaceful.  Make sure the person keeps follow-up visits with his or her health care provider. PREVENTION  Ways to prevent confusion:  Avoid alcohol.  Eat a balanced diet.  Get enough sleep.  Take medicine only as directed by your health care provider.  Do not become isolated. Spend time with other people and make plans for your days.  Keep careful watch on your blood sugar levels if you are diabetic. SEEK IMMEDIATE MEDICAL CARE IF:   You develop severe headaches, repeated vomiting, seizures, blackouts, or  slurred speech.  There is increasing confusion, weakness, numbness, restlessness, or personality changes.  You develop a loss of balance, have marked dizziness, feel uncoordinated, or fall.  You have delusions, hallucinations, or develop severe anxiety.  Your family members think you need to be rechecked.   This information is not intended to replace advice given  to you by your health care provider. Make sure you discuss any questions you have with your health care provider.   Document Released: 12/27/2004 Document Revised: 12/10/2014 Document Reviewed: 12/25/2013 Elsevier Interactive Patient Education 2016 Elsevier Inc.  Panic Attacks Panic attacks are sudden, short-livedsurges of severe anxiety, fear, or discomfort. They may occur for no reason when you are relaxed, when you are anxious, or when you are sleeping. Panic attacks may occur for a number of reasons:   Healthy people occasionally have panic attacks in extreme, life-threatening situations, such as war or natural disasters. Normal anxiety is a protective mechanism of the body that helps [place] react to danger (fight or flight response).  Panic attacks are often seen with anxiety disorders, such as panic disorder, social anxiety disorder, generalized anxiety disorder, and phobias. Anxiety disorders cause excessive or uncontrollable anxiety. They may interfere with your relationships or other life activities.  Panic attacks are sometimes seen with other mental illnesses, such as depression and posttraumatic stress disorder.  Certain medical conditions, prescription medicines, and drugs of abuse can cause panic attacks. SYMPTOMS  Panic attacks start suddenly, peak within 20 minutes, and are accompanied by four or more of the following symptoms:  Pounding heart or fast heart rate (palpitations).  Sweating.  Trembling or shaking.  Shortness of breath or feeling smothered.  Feeling choked.  Chest pain or discomfort.  Nausea or strange feeling in your stomach.  Dizziness, light-headedness, or feeling like you will faint.  Chills or hot flushes.  Numbness or tingling in your lips or hands and feet.  Feeling that things are not real or feeling that you are not yourself.  Fear of losing control or going crazy.  Fear of dying. Some of these symptoms can mimic serious medical  conditions. For example, you may think you are having a heart attack. Although panic attacks can be very scary, they are not life threatening. DIAGNOSIS  Panic attacks are diagnosed through an assessment by your health care provider. Your health care provider will ask questions about your symptoms, such as where and when they occurred. Your health care provider will also ask about your medical history and use of alcohol and drugs, including prescription medicines. Your health care provider may order blood tests or other studies to rule out a serious medical condition. Your health care provider may refer you to a mental health professional for further evaluation. TREATMENT   Most healthy people who have one or two panic attacks in an extreme, life-threatening situation will not require treatment.  The treatment for panic attacks associated with anxiety disorders or other mental illness typically involves counseling with a mental health professional, medicine, or a combination of both. Your health care provider will help determine what treatment is best for you.  Panic attacks due to physical illness usually go away with treatment of the illness. If prescription medicine is causing panic attacks, talk with your health care provider about stopping the medicine, decreasing the dose, or substituting another medicine.  Panic attacks due to alcohol or drug abuse go away with abstinence. Some adults need professional help in order to stop drinking or using drugs. HOME CARE  INSTRUCTIONS   Take all medicines as directed by your health care provider.   Schedule and attend follow-up visits as directed by your health care provider. It is important to keep all your appointments. SEEK MEDICAL CARE IF:  You are not able to take your medicines as prescribed.  Your symptoms do not improve or get worse. SEEK IMMEDIATE MEDICAL CARE IF:   You experience panic attack symptoms that are different than your usual  symptoms.  You have serious thoughts about hurting yourself or others.  You are taking medicine for panic attacks and have a serious side effect. MAKE SURE YOU:  Understand these instructions.  Will watch your condition.  Will get help right away if you are not doing well or get worse.   This information is not intended to replace advice given to you by your health care provider. Make sure you discuss any questions you have with your health care provider.   Document Released: 11/19/2005 Document Revised: 11/24/2013 Document Reviewed: 07/03/2013 Elsevier Interactive Patient Education [employer].

## 2016-02-28 DIAGNOSIS — G479 Sleep disorder, unspecified: Secondary | ICD-10-CM | POA: Insufficient documentation

## 2016-03-06 ENCOUNTER — Emergency Department
Admission: EM | Admit: 2016-03-06 | Discharge: 2016-03-08 | Disposition: A | Discharge status: Other Acute Inpatient Hospital | Payer: Medicare Other | Attending: Emergency Medicine | Admitting: Emergency Medicine

## 2016-03-06 ENCOUNTER — Encounter: Payer: Self-pay | Admitting: Emergency Medicine

## 2016-03-06 DIAGNOSIS — E7800 Pure hypercholesterolemia, unspecified: Secondary | ICD-10-CM | POA: Diagnosis not present

## 2016-03-06 DIAGNOSIS — I110 Hypertensive heart disease with heart failure: Secondary | ICD-10-CM | POA: Diagnosis not present

## 2016-03-06 DIAGNOSIS — F329 Major depressive disorder, single episode, unspecified: Secondary | ICD-10-CM | POA: Diagnosis not present

## 2016-03-06 DIAGNOSIS — F32A Depression, unspecified: Secondary | ICD-10-CM

## 2016-03-06 DIAGNOSIS — I5032 Chronic diastolic (congestive) heart failure: Secondary | ICD-10-CM | POA: Diagnosis not present

## 2016-03-06 DIAGNOSIS — Z915 Personal history of self-harm: Secondary | ICD-10-CM | POA: Diagnosis not present

## 2016-03-06 DIAGNOSIS — M069 Rheumatoid arthritis, unspecified: Secondary | ICD-10-CM | POA: Insufficient documentation

## 2016-03-06 DIAGNOSIS — Z90710 Acquired absence of both cervix and uterus: Secondary | ICD-10-CM | POA: Diagnosis not present

## 2016-03-06 DIAGNOSIS — R45851 Suicidal ideations: Secondary | ICD-10-CM

## 2016-03-06 DIAGNOSIS — C801 Malignant (primary) neoplasm, unspecified: Secondary | ICD-10-CM | POA: Insufficient documentation

## 2016-03-06 DIAGNOSIS — F332 Major depressive disorder, recurrent severe without psychotic features: Secondary | ICD-10-CM | POA: Diagnosis not present

## 2016-03-06 DIAGNOSIS — Z952 Presence of prosthetic heart valve: Secondary | ICD-10-CM | POA: Insufficient documentation

## 2016-03-06 DIAGNOSIS — Z7982 Long term (current) use of aspirin: Secondary | ICD-10-CM | POA: Diagnosis not present

## 2016-03-06 DIAGNOSIS — E039 Hypothyroidism, unspecified: Secondary | ICD-10-CM | POA: Diagnosis not present

## 2016-03-06 DIAGNOSIS — I350 Nonrheumatic aortic (valve) stenosis: Secondary | ICD-10-CM | POA: Insufficient documentation

## 2016-03-06 DIAGNOSIS — M797 Fibromyalgia: Secondary | ICD-10-CM | POA: Diagnosis present

## 2016-03-06 DIAGNOSIS — I2510 Atherosclerotic heart disease of native coronary artery without angina pectoris: Secondary | ICD-10-CM | POA: Diagnosis not present

## 2016-03-06 DIAGNOSIS — T1491 Suicide attempt: Secondary | ICD-10-CM | POA: Diagnosis not present

## 2016-03-06 DIAGNOSIS — I442 Atrioventricular block, complete: Secondary | ICD-10-CM | POA: Insufficient documentation

## 2016-03-06 DIAGNOSIS — Z7984 Long term (current) use of oral hypoglycemic drugs: Secondary | ICD-10-CM | POA: Diagnosis not present

## 2016-03-06 DIAGNOSIS — I10 Essential (primary) hypertension: Secondary | ICD-10-CM | POA: Diagnosis present

## 2016-03-06 DIAGNOSIS — Z79899 Other long term (current) drug therapy: Secondary | ICD-10-CM | POA: Insufficient documentation

## 2016-03-06 DIAGNOSIS — E876 Hypokalemia: Secondary | ICD-10-CM | POA: Diagnosis present

## 2016-03-06 DIAGNOSIS — Z7289 Other problems related to lifestyle: Secondary | ICD-10-CM

## 2016-03-06 DIAGNOSIS — E119 Type 2 diabetes mellitus without complications: Secondary | ICD-10-CM | POA: Diagnosis not present

## 2016-03-06 LAB — URINE DRUG SCREEN, QUALITATIVE (ARMC ONLY)
AMPHETAMINES, UR SCREEN: NOT DETECTED
BARBITURATES, UR SCREEN: NOT DETECTED
BENZODIAZEPINE, UR SCRN: NOT DETECTED
Cannabinoid 50 Ng, Ur ~~LOC~~: NOT DETECTED
Cocaine Metabolite,Ur ~~LOC~~: NOT DETECTED
MDMA (Ecstasy)Ur Screen: NOT DETECTED
METHADONE SCREEN, URINE: NOT DETECTED
OPIATE, UR SCREEN: NOT DETECTED
PHENCYCLIDINE (PCP) UR S: NOT DETECTED
Tricyclic, Ur Screen: POSITIVE — AB

## 2016-03-06 LAB — CBC
HEMATOCRIT: 43.8 % (ref 35.0–47.0)
Hemoglobin: 14.3 g/dL (ref 12.0–16.0)
MCH: 25.9 pg — ABNORMAL LOW (ref 26.0–34.0)
MCHC: 32.7 g/dL (ref 32.0–36.0)
MCV: 79.2 fL — AB (ref 80.0–100.0)
PLATELETS: 320 10*3/uL (ref 150–440)
RBC: 5.53 MIL/uL — ABNORMAL HIGH (ref 3.80–5.20)
RDW: 17 % — AB (ref 11.5–14.5)
WBC: 14 10*3/uL — AB (ref 3.6–11.0)

## 2016-03-06 LAB — GLUCOSE, CAPILLARY: Glucose-Capillary: 112 mg/dL — ABNORMAL HIGH (ref 65–99)

## 2016-03-06 LAB — COMPREHENSIVE METABOLIC PANEL
ALBUMIN: 4.8 g/dL (ref 3.5–5.0)
ALK PHOS: 66 U/L (ref 38–126)
ALT: 10 U/L — ABNORMAL LOW (ref 14–54)
ANION GAP: 10 (ref 5–15)
AST: 20 U/L (ref 15–41)
BILIRUBIN TOTAL: 1 mg/dL (ref 0.3–1.2)
BUN: 23 mg/dL — ABNORMAL HIGH (ref 6–20)
CALCIUM: 9.6 mg/dL (ref 8.9–10.3)
CO2: 24 mmol/L (ref 22–32)
Chloride: 100 mmol/L — ABNORMAL LOW (ref 101–111)
Creatinine, Ser: 0.89 mg/dL (ref 0.44–1.00)
GFR calc non Af Amer: >60 mL/min (ref >60)
Glucose, Bld: 209 mg/dL — ABNORMAL HIGH (ref 65–99)
POTASSIUM: 3.8 mmol/L (ref 3.5–5.1)
Sodium: 134 mmol/L — ABNORMAL LOW (ref 135–145)
TOTAL PROTEIN: 8.2 g/dL — AB (ref 6.5–8.1)

## 2016-03-06 LAB — SALICYLATE LEVEL: Salicylate Lvl: <4 mg/dL (ref 2.8–30.0)

## 2016-03-06 LAB — ETHANOL

## 2016-03-06 LAB — ACETAMINOPHEN LEVEL

## 2016-03-06 MED ORDER — POTASSIUM CHLORIDE CRYS ER 20 MEQ PO TBCR
10.0000 meq | EXTENDED_RELEASE_TABLET | Freq: Every day | ORAL | Status: DC
  Administered 2016-03-06 – 2016-03-08 (×3): 10 meq via ORAL
  Filled 2016-03-06 (×3): qty 1

## 2016-03-06 MED ORDER — CLONAZEPAM 0.5 MG PO TABS
1.0000 mg | ORAL_TABLET | Freq: Every day | ORAL | Status: DC
  Administered 2016-03-06 – 2016-03-07 (×2): 1 mg via ORAL
  Filled 2016-03-06 (×2): qty 2

## 2016-03-06 MED ORDER — LEVOTHYROXINE SODIUM 100 MCG PO TABS
100.0000 ug | ORAL_TABLET | Freq: Every day | ORAL | Status: DC
  Administered 2016-03-08: 100 ug via ORAL
  Filled 2016-03-06 (×4): qty 1

## 2016-03-06 MED ORDER — TAMSULOSIN HCL 0.4 MG PO CAPS
0.4000 mg | ORAL_CAPSULE | Freq: Every day | ORAL | Status: DC
  Administered 2016-03-06 – 2016-03-07 (×2): 0.4 mg via ORAL
  Filled 2016-03-06 (×2): qty 1

## 2016-03-06 MED ORDER — TRAMADOL HCL 50 MG PO TABS
50.0000 mg | ORAL_TABLET | Freq: Four times a day (QID) | ORAL | Status: DC | PRN
  Administered 2016-03-06 – 2016-03-07 (×2): 50 mg via ORAL
  Filled 2016-03-06 (×2): qty 1

## 2016-03-06 MED ORDER — ATORVASTATIN CALCIUM 20 MG PO TABS
40.0000 mg | ORAL_TABLET | Freq: Every day | ORAL | Status: DC
  Administered 2016-03-07: 40 mg via ORAL
  Filled 2016-03-06: qty 2

## 2016-03-06 MED ORDER — LISINOPRIL 10 MG PO TABS
10.0000 mg | ORAL_TABLET | Freq: Every day | ORAL | Status: DC
  Administered 2016-03-06 – 2016-03-08 (×3): 10 mg via ORAL
  Filled 2016-03-06 (×3): qty 1

## 2016-03-06 MED ORDER — LINAGLIPTIN 5 MG PO TABS
2.5000 mg | ORAL_TABLET | Freq: Every day | ORAL | Status: DC
  Administered 2016-03-06 – 2016-03-08 (×3): 2.5 mg via ORAL
  Filled 2016-03-06 (×3): qty 1

## 2016-03-06 MED ORDER — PANTOPRAZOLE SODIUM 40 MG PO TBEC
40.0000 mg | DELAYED_RELEASE_TABLET | Freq: Every day | ORAL | Status: DC
  Administered 2016-03-06 – 2016-03-08 (×3): 40 mg via ORAL
  Filled 2016-03-06 (×3): qty 1

## 2016-03-06 MED ORDER — CLOPIDOGREL BISULFATE 75 MG PO TABS
75.0000 mg | ORAL_TABLET | Freq: Every day | ORAL | Status: DC
  Administered 2016-03-06 – 2016-03-08 (×3): 75 mg via ORAL
  Filled 2016-03-06 (×3): qty 1

## 2016-03-06 MED ORDER — METFORMIN HCL 500 MG PO TABS
500.0000 mg | ORAL_TABLET | Freq: Every day | ORAL | Status: DC
  Administered 2016-03-06 – 2016-03-07 (×2): 500 mg via ORAL
  Filled 2016-03-06 (×2): qty 1

## 2016-03-06 MED ORDER — CITALOPRAM HYDROBROMIDE 20 MG PO TABS
40.0000 mg | ORAL_TABLET | Freq: Every day | ORAL | Status: DC
  Administered 2016-03-06 – 2016-03-08 (×3): 40 mg via ORAL
  Filled 2016-03-06 (×3): qty 2

## 2016-03-06 MED ORDER — ASPIRIN EC 81 MG PO TBEC
81.0000 mg | DELAYED_RELEASE_TABLET | Freq: Every day | ORAL | Status: DC
  Administered 2016-03-06 – 2016-03-08 (×3): 81 mg via ORAL
  Filled 2016-03-06 (×3): qty 1

## 2016-03-06 MED ORDER — METOPROLOL TARTRATE 25 MG PO TABS
25.0000 mg | ORAL_TABLET | Freq: Every day | ORAL | Status: DC
  Administered 2016-03-06 – 2016-03-08 (×3): 25 mg via ORAL
  Filled 2016-03-06 (×3): qty 1
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
## 2016-03-06 NOTE — ED Notes (Signed)
IVC 
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
## 2016-03-06 NOTE — ED Notes (Signed)
Presents with family  States she has been very depressed lately and wants to hurt herself  staes she tried to cut her chest yesterday

## 2016-03-06 NOTE — BHH Counselor (Signed)
Per Ava at [facility], Chesley Noon (TTS counselor) is physically located at [facility] and doing assessments until 12am. She can be reached at [phone].  - Ramond Dial, [facility]
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
## 2016-03-06 NOTE — ED Notes (Signed)
PA student in with pt. Pt given supper tray to eat when student leaves.

## 2016-03-07 ENCOUNTER — Emergency Department: Payer: Medicare Other

## 2016-03-07 LAB — URINALYSIS COMPLETE WITH MICROSCOPIC (ARMC ONLY)
BILIRUBIN URINE: NEGATIVE
GLUCOSE, UA: NEGATIVE mg/dL
LEUKOCYTES UA: NEGATIVE
Nitrite: NEGATIVE
PH: 5 (ref 5.0–8.0)
Protein, ur: 30 mg/dL — AB
Specific Gravity, Urine: 1.026 (ref 1.005–1.030)

## 2016-03-07 LAB — GLUCOSE, CAPILLARY
Glucose-Capillary: 151 mg/dL — ABNORMAL HIGH (ref 65–99)
Glucose-Capillary: 154 mg/dL — ABNORMAL HIGH (ref 65–99)

## 2016-03-07 NOTE — ED Notes (Signed)
Pt observed lying in bed - watching TV   Pt visualized with NAD  No verbalized needs or concerns at this time  Continue to monitor

## 2016-03-07 NOTE — ED Notes (Signed)
BEHAVIORAL HEALTH ROUNDING Patient sleeping: No. Patient alert and oriented: yes Behavior appropriate: Yes.  ; If no, describe:  Nutrition and fluids offered: yes Toileting and hygiene offered: Yes  Sitter present: q15 minute observations and security  monitoring Law enforcement present: Yes  ODS  

## 2016-03-07 NOTE — ED Notes (Signed)
Hospital bed provided for her comfort  - fresh urine sample sent

## 2016-03-07 NOTE — ED Notes (Addendum)

## 2016-03-07 NOTE — ED Notes (Signed)
Daughter is currently visiting - observed by pt relations

## 2016-03-07 NOTE — ED Notes (Signed)
Lunch provided along with an extra drink - set up provided

## 2016-03-07 NOTE — ED Provider Notes (Signed)
-----------------------------------------   6:46 AM on 03/07/2016 -----------------------------------------   Blood pressure 147/61, pulse 77, temperature 98.2 F (36.8 C), temperature source Oral, resp. rate 16, height 5\' 4"  (1.626 m), weight 185 lb (83.915 kg), SpO2 96 %.  The patient had no acute events since last update.  Calm and cooperative at this time.  Disposition is pending per Psychiatry/Behavioral Medicine team recommendations.     Paulette Blanch, MD 03/07/16 [phone]

## 2016-03-07 NOTE — BH Assessment (Addendum)
Referral information for Geriatric Placement have been faxed to;    [facility] [phone]   [phone]),    [facility]([phone]),    [facility]([phone]),    [facility] [phone])   [facility]([phone]),    [facility]([phone]),    [phone]).  Writer discussed with patients daughter Catherine [name]) about seeking Geriatric Inpatient.

## 2016-03-07 NOTE — ED Notes (Signed)
Pt with an observed visit from her spouse  "We have been married for 51 years - we take care of each other - I have been in a deep depression."

## 2016-03-07 NOTE — ED Notes (Signed)
Breakfast provided   - assisted her with set up

## 2016-03-07 NOTE — ED Notes (Signed)
I assisted her to the BR

## 2016-03-07 NOTE — ED Notes (Signed)
Supper provided  - assisted with set up

## 2016-03-07 NOTE — BH Assessment (Signed)
Received phone call from [facility] [phone]) requesting Urinalysis. Patient's nurse (Amy T.) is aware of the request.

## 2016-03-07 NOTE — ED Notes (Signed)
ED BHU [place] Is the patient under IVC or is there intent for IVC: Yes.   Is the patient medically cleared: Yes.   Is there vacancy in the ED BHU: no -    Is the population mix appropriate for patient: Yes.   Is the patient awaiting placement in inpatient or outpatient setting: Yes.  inpt geriatric psych unit- awaiting placement   Has the patient had a psychiatric consult: Yes.   Survey of unit performed for contraband, proper placement and condition of furniture, tampering with fixtures in bathroom, shower, and each patient room: Yes.  ; Findings:  APPEARANCE/BEHAVIOR Calm and cooperative NEURO ASSESSMENT Orientation: oriented x3  Denies pain Hallucinations: No.None noted (Hallucinations) Speech: Normal  Slow to respond Gait: normal RESPIRATORY ASSESSMENT Even  Unlabored respirations  CARDIOVASCULAR ASSESSMENT Pulses equal   regular rate  Skin warm and dry   GASTROINTESTINAL ASSESSMENT no GI complaint EXTREMITIES Full ROM  PLAN OF CARE Provide calm/safe environment. Vital signs assessed twice daily. ED BHU Assessment once each 12-hour shift. Collaborate with intake RN daily or as condition indicates. Assure the ED provider has rounded once each shift. Provide and encourage hygiene. Provide redirection as needed. Assess for escalating behavior; address immediately and inform ED provider.  Assess family dynamic and appropriateness for visitation as needed: Yes.  ; If necessary, describe findings:  Educate the patient/family about BHU procedures/visitation: Yes.  ; If necessary, describe findings:

## 2016-03-08 LAB — GLUCOSE, CAPILLARY
Glucose-Capillary: 154 mg/dL — ABNORMAL HIGH (ref 65–99)
Glucose-Capillary: 91 mg/dL (ref 65–99)

## 2016-03-08 NOTE — BH Assessment (Signed)
Night Shift TTS(Tressa) sent requested information(Urinalysis) to [facility]. Confirmed it was received Terri Piedra) and currently under review, pending psychiatrist approval.

## 2016-03-08 NOTE — ED Notes (Signed)
Pt up to bathroom, with cane and mild assistance, pt given warm wipes to do peri care and new brief; pt in room 23 eating breakfast

## 2016-03-08 NOTE — BH Assessment (Addendum)
Patient has been accepted to [facility]  Patient assigned to Geriatric Unit Accepting physician is Dr. Geanie Kenning.  Call report to [phone]  Representative was Gracie.  ER Staff is aware of it Legrand Pitts, ER Sect.; Dr. Jacqualine Code, ER MD & Amy T. Patient's Nurse)     Patient's Family/Support System ([phone] daughter and Jenny Reichmann Risdon-[phone] Husband) have been updated as well.  Patient's husband requested to visit patient early today, because the patient may transferred to [facility] before the visitation hours. Writer spoke with patient's nurse (Amy T.) and she stated it was okay. Writer called patient's husband back and let him know it was okay for an early visit.

## 2016-03-08 NOTE — ED Provider Notes (Signed)
Filed Vitals:   03/08/16 0620 03/08/16 0928  BP: 147/63   Pulse: 77   Temp: 98 F (36.7 C) 97.9 F (36.6 C)  Resp: 16    Patient in no distress. Family at bedside with her. Pending transfer to [facility] geriatric psychiatric care where she has been accepted.  Stable vitals. No distress. Alert.  Delman Kitten, MD 03/08/16 1056

## 2016-03-08 NOTE — ED Notes (Signed)
Assisted pt up to bathroom and back into bed.  

## 2016-03-08 NOTE — ED Notes (Signed)
BEHAVIORAL HEALTH ROUNDING Patient sleeping: No. Patient alert and oriented: yes Behavior appropriate: Yes.  ; If no, describe:  Nutrition and fluids offered: yes Toileting and hygiene offered: Yes  Sitter present: q15 minute observations and security  monitoring Law enforcement present: Yes  ODS  

## 2016-03-08 NOTE — ED Notes (Signed)
Pt resting comfortably,no increased work in breathing noted. Denies any complaints att this time. Will continue to monitor.
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
## 2016-03-08 NOTE — ED Notes (Signed)
Report called to Alben Spittle  Pt has left our facility

## 2016-03-08 NOTE — ED Notes (Signed)
I attempted to call report - they stated that they cannot take report until 1 hour prior to arrival

## 2016-03-08 NOTE — ED Notes (Signed)
Breakfast placed in her room - she has her eyes closed - appears to be sleeping    [place] Patient sleeping: Yes.   Patient alert and oriented: eyes closed  Appears asleep Behavior appropriate: Yes.  ; If no, describe:  Nutrition and fluids offered: Yes  Toileting and hygiene offered: sleeping Sitter present: q 15 minute observations and security camera monitoring Law enforcement present: yes  ODS  ENVIRONMENTAL ASSESSMENT Potentially harmful objects out of patient reach: Yes.   Personal belongings secured: Yes.   Patient dressed in hospital provided attire only: Yes.   Plastic bags out of patient reach: Yes.   Patient care equipment (cords, cables, call bells, lines, and drains) shortened, removed, or accounted for: Yes.   Equipment and supplies removed from bottom of stretcher: Yes.   Potentially toxic materials out of patient reach: Yes.   Sharps container removed or out of patient reach: Yes.

## 2016-03-08 NOTE — ED Notes (Signed)
meds administered as ordered - assessment completed  She denies pain

## 2016-03-08 NOTE — ED Notes (Signed)
Catherine [name] Is the patient under IVC or is there intent for IVC: Yes.   Is the patient medically cleared: Yes.   Is there vacancy in the [facility] BHU: Yes.   Is the population mix appropriate for patient:  - assistance with ambulation  Is the patient awaiting placement in inpatient or outpatient setting: Yes.  inpt admission   Has the patient had a psychiatric consult: Yes.   Survey of unit performed for contraband, proper placement and condition of furniture, tampering with fixtures in bathroom, shower, and each patient room: Yes.  ; Findings:  APPEARANCE/BEHAVIOR Calm and cooperative NEURO ASSESSMENT Orientation: oriented x4  Denies pain Hallucinations: No.None noted (Hallucinations) Speech: Normal Gait: normal/ steady reccommeded RESPIRATORY ASSESSMENT Even  Unlabored respirations  CARDIOVASCULAR ASSESSMENT Pulses equal   regular rate  Skin warm and dry   GASTROINTESTINAL ASSESSMENT no GI complaint EXTREMITIES Full ROM  PLAN OF CARE Provide calm/safe environment. Vital signs assessed twice daily. [facility] BHU Assessment once each 12-hour shift. Collaborate with intake RN daily or as condition indicates. Assure the [facility] provider has rounded once each shift. Provide and encourage hygiene. Provide redirection as needed. Assess for escalating behavior; address immediately and inform [facility] provider.  Assess family dynamic and appropriateness for visitation as needed: Yes.  ; If necessary, describe findings:  Educate the patient/family about BHU procedures/visitation: Yes.  ; If necessary, describe findings:

## 2016-03-13 DIAGNOSIS — R9431 Abnormal electrocardiogram [ECG] [EKG]: Secondary | ICD-10-CM | POA: Insufficient documentation

## 2016-03-26 ENCOUNTER — Ambulatory Visit (INDEPENDENT_AMBULATORY_CARE_PROVIDER_SITE_OTHER): Payer: Medicare Other | Admitting: *Deleted

## 2016-03-26 ENCOUNTER — Telehealth: Payer: Self-pay | Admitting: Cardiology

## 2016-03-26 DIAGNOSIS — I442 Atrioventricular block, complete: Secondary | ICD-10-CM

## 2016-03-26 NOTE — Progress Notes (Signed)
Remote pacemaker transmission.   

## 2016-03-26 NOTE — Telephone Encounter (Signed)
Spoke with pt and reminded pt of remote transmission that is due today. Pt verbalized understanding.   

## 2016-04-30 LAB — CUP PACEART REMOTE DEVICE CHECK
Battery Remaining Longevity: 125 mo
Battery Remaining Percentage: 95.5 %
Brady Statistic AP VS Percent: 18 %
Brady Statistic AS VS Percent: 82 %
Implantable Lead Implant Date: 20160928
Implantable Lead Location: 753859
Lead Channel Impedance Value: 440 Ohm
Lead Channel Pacing Threshold Amplitude: 0.75 V
Lead Channel Pacing Threshold Pulse Width: 0.4 ms
Lead Channel Sensing Intrinsic Amplitude: 3.6 mV
Lead Channel Setting Pacing Amplitude: 1 V
Lead Channel Setting Pacing Pulse Width: 0.4 ms
MDC IDC LEAD IMPLANT DT: 20160928
MDC IDC LEAD LOCATION: 753860
MDC IDC MSMT BATTERY VOLTAGE: 2.99 V
MDC IDC MSMT LEADCHNL RA PACING THRESHOLD AMPLITUDE: 0.75 V
MDC IDC MSMT LEADCHNL RA PACING THRESHOLD PULSEWIDTH: 0.4 ms
MDC IDC MSMT LEADCHNL RV IMPEDANCE VALUE: 490 Ohm
MDC IDC MSMT LEADCHNL RV SENSING INTR AMPL: 12 mV
MDC IDC PG SERIAL: 7779295
MDC IDC SESS DTM: 20170424155807
MDC IDC SET LEADCHNL RA PACING AMPLITUDE: 2 V
MDC IDC SET LEADCHNL RV SENSING SENSITIVITY: 4 mV
MDC IDC STAT BRADY AP VP PERCENT: 1 %
MDC IDC STAT BRADY AS VP PERCENT: 1 %
MDC IDC STAT BRADY RA PERCENT PACED: 18 %
MDC IDC STAT BRADY RV PERCENT PACED: 1 %

## 2016-04-30 NOTE — Progress Notes (Signed)
Normal remote reviewed.  Next Merlin 06/25/16 

## 2016-05-04 ENCOUNTER — Encounter: Payer: Self-pay | Admitting: Cardiology

## 2016-05-08 ENCOUNTER — Ambulatory Visit (INDEPENDENT_AMBULATORY_CARE_PROVIDER_SITE_OTHER): Payer: 59 | Admitting: Psychiatry

## 2016-05-08 ENCOUNTER — Encounter (HOSPITAL_COMMUNITY): Payer: Self-pay | Admitting: Psychiatry

## 2016-05-08 VITALS — BP 142/86 | HR 67 | Ht 63.0 in | Wt 183.8 lb

## 2016-05-08 DIAGNOSIS — F331 Major depressive disorder, recurrent, moderate: Secondary | ICD-10-CM | POA: Diagnosis not present

## 2016-05-08 MED ORDER — PAROXETINE HCL 20 MG PO TABS: 20.0000 mg | ORAL_TABLET | Freq: Every day | ORAL | Status: DC

## 2016-05-08 MED ORDER — BUPROPION HCL ER (XL) 300 MG PO TB24
300.0000 mg | ORAL_TABLET | Freq: Every day | ORAL | Status: DC
Start: 2016-05-08 — End: 2016-05-29

## 2016-05-08 MED ORDER — TRAZODONE HCL 50 MG PO TABS: 50.0000 mg | ORAL_TABLET | Freq: Every evening | ORAL | Status: DC | PRN

## 2016-05-08 MED ORDER — CLONAZEPAM 1 MG PO TABS
1.0000 mg | ORAL_TABLET | Freq: Every day | ORAL | Status: DC
Start: 2016-05-08 — End: 2016-05-29
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
## 2016-05-29 ENCOUNTER — Ambulatory Visit (INDEPENDENT_AMBULATORY_CARE_PROVIDER_SITE_OTHER): Payer: 59 | Admitting: Psychiatry

## 2016-05-29 ENCOUNTER — Encounter (HOSPITAL_COMMUNITY): Payer: Self-pay | Admitting: Psychiatry

## 2016-05-29 VITALS — BP 122/74 | HR 69 | Ht 62.0 in | Wt 190.6 lb

## 2016-05-29 DIAGNOSIS — F331 Major depressive disorder, recurrent, moderate: Secondary | ICD-10-CM

## 2016-05-29 MED ORDER — BUPROPION HCL ER (XL) 300 MG PO TB24: 300.0000 mg | ORAL_TABLET | Freq: Every day | ORAL | Status: DC

## 2016-05-29 MED ORDER — CLONAZEPAM 1 MG PO TABS: 1.0000 mg | ORAL_TABLET | Freq: Every day | ORAL | Status: DC

## 2016-05-29 MED ORDER — PAROXETINE HCL 20 MG PO TABS: 20.0000 mg | ORAL_TABLET | Freq: Every day | ORAL | Status: DC

## 2016-05-29 MED ORDER — TRAZODONE HCL 50 MG PO TABS: 50.0000 mg | ORAL_TABLET | Freq: Every evening | ORAL | Status: DC | PRN
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
## 2016-06-07 ENCOUNTER — Ambulatory Visit (HOSPITAL_COMMUNITY): Payer: Self-pay | Admitting: Psychology

## 2016-06-25 ENCOUNTER — Telehealth: Payer: Self-pay | Admitting: Cardiology

## 2016-06-25 ENCOUNTER — Ambulatory Visit (INDEPENDENT_AMBULATORY_CARE_PROVIDER_SITE_OTHER): Payer: Medicare Other | Admitting: *Deleted

## 2016-06-25 DIAGNOSIS — I442 Atrioventricular block, complete: Secondary | ICD-10-CM

## 2016-06-25 NOTE — Progress Notes (Signed)
Remote pacemaker transmission.   

## 2016-06-25 NOTE — Telephone Encounter (Signed)
Spoke with pt and reminded pt of remote transmission that is due today. Pt verbalized understanding.   

## 2016-06-27 ENCOUNTER — Encounter: Payer: Self-pay | Admitting: Cardiology

## 2016-06-27 LAB — CUP PACEART REMOTE DEVICE CHECK
Battery Voltage: 3.01 V
Brady Statistic AP VS Percent: 11 %
Brady Statistic AS VP Percent: 1 %
Brady Statistic AS VS Percent: 88 %
Implantable Lead Implant Date: 20160928
Implantable Lead Implant Date: 20160928
Implantable Lead Location: 753859
Lead Channel Pacing Threshold Amplitude: 0.75 V
Lead Channel Pacing Threshold Amplitude: 3.625 V
Lead Channel Pacing Threshold Pulse Width: 0.4 ms
Lead Channel Sensing Intrinsic Amplitude: 12 mV
Lead Channel Sensing Intrinsic Amplitude: 4.4 mV
Lead Channel Setting Pacing Amplitude: 2 V
Lead Channel Setting Pacing Amplitude: 3.875
Lead Channel Setting Pacing Pulse Width: 0.4 ms
MDC IDC LEAD LOCATION: 753860
MDC IDC MSMT BATTERY REMAINING LONGEVITY: 113 mo
MDC IDC MSMT BATTERY REMAINING PERCENTAGE: 95.5 %
MDC IDC MSMT LEADCHNL RA IMPEDANCE VALUE: 440 Ohm
MDC IDC MSMT LEADCHNL RA PACING THRESHOLD PULSEWIDTH: 0.4 ms
MDC IDC MSMT LEADCHNL RV IMPEDANCE VALUE: 490 Ohm
MDC IDC PG SERIAL: 7779295
MDC IDC SESS DTM: 20170724152708
MDC IDC SET LEADCHNL RV SENSING SENSITIVITY: 4 mV
MDC IDC STAT BRADY AP VP PERCENT: 1 %
MDC IDC STAT BRADY RA PERCENT PACED: 11 %
MDC IDC STAT BRADY RV PERCENT PACED: 1 %

## 2016-06-29 ENCOUNTER — Telehealth: Payer: Self-pay | Admitting: *Deleted

## 2016-06-29 ENCOUNTER — Telehealth: Payer: Self-pay | Admitting: Cardiology

## 2016-06-29 ENCOUNTER — Ambulatory Visit (HOSPITAL_COMMUNITY)
Admission: RE | Admit: 2016-06-29 | Discharge: 2016-06-29 | Disposition: A | Discharge status: Home / Self Care | Payer: Medicare Other | Source: Ambulatory Visit | Attending: Cardiology | Admitting: Cardiology

## 2016-06-29 DIAGNOSIS — Z950 Presence of cardiac pacemaker: Secondary | ICD-10-CM | POA: Diagnosis present

## 2016-06-29 DIAGNOSIS — I700 Atherosclerosis of aorta: Secondary | ICD-10-CM | POA: Diagnosis not present

## 2016-06-29 NOTE — Telephone Encounter (Signed)
  Notes Recorded by Will Meredith Leeds, MD on 06/27/2016 at 12:31 PM EDT Can we get her a CXR to take a look at the leads?   I informed patient that her remote transmission was reviewed and it showed an increase in RV threshold. I informed her that bc of this Dr.Camnitz recommends that she get a cxr to evaluate RV lead position.  CXR ordered for [facility]. Patient instructed to go into the [facility] entrance and ask for directions to the radiology department.  Patient voiced understanding and plans to go next week.

## 2016-06-29 NOTE — Telephone Encounter (Signed)
New message       The pt wants results of the x-ray done this morning

## 2016-06-29 NOTE — Telephone Encounter (Signed)
PT AWARE CXR  HAD  NOT  BEEN OFFICIALLY  REVIEWED BY  DR  CAMNITZ  BUT  PM  APPEARS  STABLE  AS  WELL AS  ELECTRODES AND NO SIGNS OF   CHF   WHILE DISCUSSING   CXR  PT  C/O  OF  RIGHT  EAR   HAVING  A  ECHO SOUND  WILL FORWARD TO DR CAMNITZ  FOR REVIEW .Adonis Housekeeper

## 2016-07-02 NOTE — Telephone Encounter (Signed)
New Message  Pt call requesting to speak with RN about Xray results. Please call back to discuss

## 2016-07-02 NOTE — Telephone Encounter (Signed)
Informed patient that Dr.Camnitz had not yet reviewed her cxr, but the preliminary findings suggest that her lead is still in a stable position. Patient voiced understanding.   I explained to her that we would update her once it's reviewed by Dr.Camnitz. Again, patient voiced understanding.

## 2016-07-04 NOTE — Telephone Encounter (Signed)
Patient aware of Dr.Camnitz's recommendations. Patient will follow up in the office on 8/17 @ 1130.

## 2016-07-04 NOTE — Telephone Encounter (Signed)
Lets have her come back in to discuss CXR results and the possibility of lead revision of the RV lead.

## 2016-07-09 ENCOUNTER — Other Ambulatory Visit: Payer: Self-pay | Admitting: Physician Assistant

## 2016-07-18 NOTE — Progress Notes (Signed)
Electrophysiology Office Note   Date:  07/19/2016   ID:  Catherine [name], DOB 1941-06-20, MRN [id number]  PCP:  Jilda Panda, MD  Cardiologist:  Burt Knack Primary Electrophysiologist:  Gustin Zobrist Meredith Leeds, MD    Chief Complaint  Patient presents with  . Pacemaker Check     History of Present Illness: Catherine [name] is a 75 y.o. female who presents today for electrophysiology evaluation.   She has history of type 2 diabetes, hypertension,coronary artery disease, aortic stenosis status TAVR, and complete heart block post pacemaker placement, located by pneumothorax. She presents today as a follow-up from her pacemaker placement.Under mode device check, her RV threshold was elevated at 3.8. On recheck in clinic her RV threshold is down to 0.5.    Today, she denies symptoms of palpitations, chest pain, shortness of breath, orthopnea, PND, lower extremity edema, claudication, dizziness, presyncope, syncope, bleeding, or neurologic sequela. The patient is tolerating medications without difficulties and is otherwise without complaint today.     Past Medical History:  Diagnosis Date  . Acute colitis   . Anemia   . CAD (coronary artery disease)    a. heavy calcification of the entire LAD & mod eccentric mid LAD stenosis, estimated @ 70%, mild nonobs stenosis of RCA and LCx, severely calcified Ao valve with restricted valve mobility and 2+ AI    . Cancer [facility])    breast  . CHB (complete heart block) ([place]) 08/31/2015   St Jude Medical Assurity DR model [id number] (serial number [id number] )   . Chronic diastolic congestive heart failure ([place])    a. echo 08/31/2015: EF 65-70%, nl WM, GR1DD, Ao valve stent bioprosthesis was present and functioning nl, no regurg, LA mildly dilated, PASP 46 mm Hg  . Colon polyps   . Depression   . Diabetes mellitus type II   . Fibromyalgia   . HTN (hypertension)   . Hypercholesteremia   . Hypothyroidism   . IBS (irritable bowel syndrome)   . Rectal bleeding   .  Rheumatoid arthritis(714.0)   . S/P TAVR (transcatheter aortic valve replacement) 08/30/2015   26 mm Edwards Sapien 3 transcatheter heart valve placed via open right transfemoral approach  . Shortness of breath dyspnea   . Stenosis of aortic valve   . Thyroid disease    Past Surgical History:  Procedure Laterality Date  . ABDOMINAL SURGERY     Intestinal surgery  . CARDIAC SURGERY    . CESAREAN SECTION     x 2  . EP IMPLANTABLE DEVICE N/A 08/31/2015   Procedure: Pacemaker Implant;  Surgeon: Shariya Gaster Meredith Leeds, MD; [place] (serial number [id number] ); Laterality: Right  . LEFT OOPHORECTOMY    . MASTECTOMY Left   . polyp     . self inflicted chest wound    . [place]   lower back  . TEE WITHOUT CARDIOVERSION N/A 08/30/2015   Procedure: TRANSESOPHAGEAL ECHOCARDIOGRAM (TEE);  Surgeon: Sherren Mocha, MD;  Location: [place];  Service: Open Heart Surgery;  Laterality: N/A;  . TOTAL ABDOMINAL HYSTERECTOMY    . TRANSCATHETER AORTIC VALVE REPLACEMENT, TRANSFEMORAL N/A 08/30/2015   Procedure: TRANSCATHETER AORTIC VALVE REPLACEMENT, TRANSFEMORAL;  Surgeon: Sherren Mocha, MD;  Location: [place];  Service: Open Heart Surgery;  Laterality: N/A;     Current Outpatient Prescriptions  Medication Sig Dispense Refill  . aspirin EC 81 MG tablet Take 81 mg by mouth daily.    Marland Kitchen atorvastatin (LIPITOR) 40 MG tablet  Take 40 mg by mouth every evening.    Marland Kitchen buPROPion (WELLBUTRIN XL) 300 MG 24 hr tablet Take 1 tablet (300 mg total) by mouth daily. 30 tablet 2  . clonazePAM (KLONOPIN) 1 MG tablet Take 1 tablet (1 mg total) by mouth at bedtime. 30 tablet 2  . clopidogrel (PLAVIX) 75 MG tablet Take 75 mg by mouth every evening.    Marland Kitchen levothyroxine (SYNTHROID, LEVOTHROID) 100 MCG tablet Take 100 mcg by mouth daily before breakfast.     . Linagliptin-Metformin HCl (JENTADUETO) 2.5-500 MG TABS Take 0.5 tablets by mouth daily with breakfast.     . lisinopril (PRINIVIL,ZESTRIL)  10 MG tablet Take 10 mg by mouth.    Marland Kitchen lisinopril (PRINIVIL,ZESTRIL) 10 MG tablet TAKE 1 TABLET (10 MG TOTAL) BY MOUTH DAILY. 30 tablet 1  . metoprolol tartrate (LOPRESSOR) 25 MG tablet Take 25 mg by mouth daily.     . Multiple Vitamin (MULTIVITAMIN WITH MINERALS) TABS tablet Take 1 tablet by mouth daily at 12 noon.     . pantoprazole (PROTONIX) 40 MG tablet Take 1 tablet (40 mg total) by mouth daily. 30 tablet 11  . PARoxetine (PAXIL) 20 MG tablet Take 1 tablet (20 mg total) by mouth daily. 30 tablet 2  . potassium chloride (K-DUR,KLOR-CON) 10 MEQ tablet Take 10 mEq by mouth every evening.    . tamsulosin (FLOMAX) 0.4 MG CAPS capsule Take 0.4 mg by mouth daily after breakfast.    . traZODone (DESYREL) 50 MG tablet Take 1 tablet (50 mg total) by mouth at bedtime as needed for sleep. 30 tablet 2   No current facility-administered medications for this visit.     Allergies:   Sulfa antibiotics and Latex   Social History:  The patient  reports that she has never smoked. She does not have any smokeless tobacco history on file. She reports that she does not drink alcohol or use drugs.   Family History:  The patient's family history includes Depression in her sister, sister, and sister; Hypertension in her mother and sister; Lung cancer in her sister; Stroke in her sister.    ROS:  Please see the history of present illness.   Otherwise, review of systems is positive for none.   All other systems are reviewed and negative.    PHYSICAL EXAM: VS:  BP 126/82   Pulse 67   Ht 5\' 2"  (1.575 m)   Wt 195 lb (88.5 kg)   BMI 35.67 kg/m  , BMI Body mass index is 35.67 kg/m. GEN: Well nourished, well developed, in no acute distress  HEENT: normal  Neck: no JVD, carotid bruits, or masses Cardiac: RRR; no murmurs, rubs, or gallops,no edema  Respiratory:  clear to auscultation bilaterally, normal work of breathing GI: soft, nontender, nondistended, + BS MS: no deformity or atrophy  Skin: warm and dry,   device pocket is well healed Neuro:  Strength and sensation are intact Psych: euthymic mood, full affect  EKG:  EKG is ordered today. The ekg ordered today shows sinus rhythm, RBBB, rate 67   Device interrogation is reviewed today in detail.  See PaceArt for details.   Recent Labs: 08/31/2015: Magnesium 1.9 03/06/2016: ALT 10; BUN 23; Creatinine, Ser 0.89; Hemoglobin 14.3; Platelets 320; Potassium 3.8; Sodium 134    Lipid Panel  No results found for: CHOL, TRIG, HDL, CHOLHDL, VLDL, LDLCALC, LDLDIRECT   Wt Readings from Last 3 Encounters:  07/19/16 195 lb (88.5 kg)  03/06/16 185 lb (83.9 kg)  02/24/16  185 lb (83.9 kg)      Other studies Reviewed: Additional studies/ records that were reviewed today include: TTE 09/25/15  Review of the above records today demonstrates:  - Left ventricle: The cavity size was normal. Wall thickness was increased in a pattern of mild LVH. Doppler parameters are consistent with abnormal left ventricular relaxation (grade 1 diastolic dysfunction). - Aortic valve: A bioprosthesis was present. Valve area (Vmax): 3.37 cm^2. - Pericardium, extracardiac: A trivial pericardial effusion was identified.   ASSESSMENT AND PLAN:  1.  Complete heart block: Pacemaker placed 08/31/15. She now has conduction back and is pacing approximately 11-12% of the time in the ventricle. RV threshold was elevated on remote check, but in clinic today is normal. This is likely due to the auto capture setting on her device. We have adjusted the device to fix the issue.  2. Aortic stenosis s/p TAVR: doing well without any complaints. She has no chest pain or heart failure symptoms. We'll continue to monitor.  3. Hypertension: well controlled  Current medicines are reviewed at length with the patient today.   The patient does not have concerns regarding her medicines.  The following changes were made today:  none  Labs/ tests ordered today include:  No orders of  the defined types were placed in this encounter.    Disposition:   FU with Yossi Hinchman 9 months  Signed, Haleigh Desmith Meredith Leeds, MD  07/19/2016 12:03 PM     [place] [address] [phone] (office) [phone] (fax)

## 2016-07-19 ENCOUNTER — Encounter: Payer: Self-pay | Admitting: Cardiology

## 2016-07-19 ENCOUNTER — Encounter (INDEPENDENT_AMBULATORY_CARE_PROVIDER_SITE_OTHER): Payer: Self-pay

## 2016-07-19 ENCOUNTER — Ambulatory Visit (INDEPENDENT_AMBULATORY_CARE_PROVIDER_SITE_OTHER): Payer: Medicare Other | Admitting: Cardiology

## 2016-07-19 VITALS — BP 126/82 | HR 67 | Ht 62.0 in | Wt 195.0 lb

## 2016-07-19 DIAGNOSIS — I442 Atrioventricular block, complete: Secondary | ICD-10-CM | POA: Diagnosis not present

## 2016-07-19 DIAGNOSIS — Z950 Presence of cardiac pacemaker: Secondary | ICD-10-CM

## 2016-07-19 LAB — CUP PACEART INCLINIC DEVICE CHECK
Battery Voltage: 3.01 V
Brady Statistic RA Percent Paced: 10 %
Brady Statistic RV Percent Paced: 0.94 %
Date Time Interrogation Session: 20170817132709
Implantable Lead Implant Date: 20160928
Implantable Lead Location: 753860
Lead Channel Impedance Value: 487.5 Ohm
Lead Channel Pacing Threshold Amplitude: 0.75 V
Lead Channel Pacing Threshold Amplitude: 0.75 V
Lead Channel Sensing Intrinsic Amplitude: 12 mV
Lead Channel Setting Sensing Sensitivity: 4 mV
MDC IDC LEAD IMPLANT DT: 20160928
MDC IDC LEAD LOCATION: 753859
MDC IDC MSMT BATTERY REMAINING LONGEVITY: 136.8
MDC IDC MSMT LEADCHNL RA IMPEDANCE VALUE: 437.5 Ohm
MDC IDC MSMT LEADCHNL RA PACING THRESHOLD PULSEWIDTH: 0.4 ms
MDC IDC MSMT LEADCHNL RA SENSING INTR AMPL: 3.9 mV
MDC IDC MSMT LEADCHNL RV PACING THRESHOLD PULSEWIDTH: 0.4 ms
MDC IDC PG SERIAL: 7779295
MDC IDC SET LEADCHNL RA PACING AMPLITUDE: 2 V
MDC IDC SET LEADCHNL RV PACING AMPLITUDE: 2.25 V
MDC IDC SET LEADCHNL RV PACING PULSEWIDTH: 0.4 ms
Pulse Gen Model: 2240

## 2016-07-19 NOTE — Patient Instructions (Addendum)
Medication Instructions:  Your physician recommends that you continue on your current medications as directed. Please refer to the Current Medication list given to you today.   Labwork: None ordered   Testing/Procedures: None ordered   Follow-Up: Your physician wants you to follow-up in: 12 months with Dr Cora Daniels will receive a reminder letter in the mail two months in advance. If you don't receive a letter, please call our office to schedule the follow-up appointment.  Remote monitoring is used to monitor your Pacemaker from home. This monitoring reduces the number of office visits required to check your device to one time per year. It allows [place] to keep an eye on the functioning of your device to ensure it is working properly. You are scheduled for a device check from home on 10/18/16. You may send your transmission at any time that day. If you have a wireless device, the transmission will be sent automatically. After your physician reviews your transmission, you will receive a postcard with your next transmission date.     Any Other Special Instructions Will Be Listed Below (If Applicable).     If you need a refill on your cardiac medications before your next appointment, please call your pharmacy.

## 2016-07-23 ENCOUNTER — Ambulatory Visit (HOSPITAL_COMMUNITY): Payer: Self-pay | Admitting: Psychology

## 2016-08-02 ENCOUNTER — Encounter (HOSPITAL_COMMUNITY): Payer: Self-pay | Admitting: Psychology

## 2016-08-02 ENCOUNTER — Ambulatory Visit (INDEPENDENT_AMBULATORY_CARE_PROVIDER_SITE_OTHER): Payer: 59 | Admitting: Psychology

## 2016-08-02 DIAGNOSIS — F3341 Major depressive disorder, recurrent, in partial remission: Secondary | ICD-10-CM | POA: Diagnosis not present
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
## 2016-08-15 ENCOUNTER — Other Ambulatory Visit (HOSPITAL_COMMUNITY): Payer: Self-pay | Admitting: Psychiatry

## 2016-08-15 DIAGNOSIS — F331 Major depressive disorder, recurrent, moderate: Secondary | ICD-10-CM

## 2016-08-16 ENCOUNTER — Other Ambulatory Visit (HOSPITAL_COMMUNITY): Payer: Self-pay

## 2016-08-16 DIAGNOSIS — F331 Major depressive disorder, recurrent, moderate: Secondary | ICD-10-CM

## 2016-08-16 MED ORDER — TRAZODONE HCL 50 MG PO TABS: 50.0000 mg | ORAL_TABLET | Freq: Every evening | ORAL | 0 refills | Status: DC | PRN

## 2016-08-23 ENCOUNTER — Other Ambulatory Visit: Payer: Self-pay

## 2016-08-23 DIAGNOSIS — I350 Nonrheumatic aortic (valve) stenosis: Secondary | ICD-10-CM

## 2016-08-23 DIAGNOSIS — Z952 Presence of prosthetic heart valve: Secondary | ICD-10-CM

## 2016-08-29 ENCOUNTER — Ambulatory Visit (HOSPITAL_COMMUNITY): Payer: Self-pay | Admitting: Psychiatry

## 2016-09-04 ENCOUNTER — Other Ambulatory Visit (HOSPITAL_COMMUNITY): Payer: Self-pay | Admitting: Psychiatry

## 2016-09-04 DIAGNOSIS — F331 Major depressive disorder, recurrent, moderate: Secondary | ICD-10-CM

## 2016-09-05 ENCOUNTER — Other Ambulatory Visit (HOSPITAL_COMMUNITY): Payer: Self-pay | Admitting: Psychiatry

## 2016-09-05 DIAGNOSIS — F331 Major depressive disorder, recurrent, moderate: Secondary | ICD-10-CM

## 2016-09-06 ENCOUNTER — Other Ambulatory Visit (HOSPITAL_COMMUNITY): Payer: Self-pay

## 2016-09-06 ENCOUNTER — Other Ambulatory Visit (HOSPITAL_COMMUNITY): Payer: Self-pay | Admitting: Psychiatry

## 2016-09-06 DIAGNOSIS — F331 Major depressive disorder, recurrent, moderate: Secondary | ICD-10-CM

## 2016-09-06 MED ORDER — PAROXETINE HCL 20 MG PO TABS: 20.0000 mg | ORAL_TABLET | Freq: Every day | ORAL | 2 refills | Status: DC

## 2016-09-06 MED ORDER — CLONAZEPAM 1 MG PO TABS: 1.0000 mg | ORAL_TABLET | Freq: Every day | ORAL | 2 refills | Status: DC

## 2016-09-12 ENCOUNTER — Other Ambulatory Visit (HOSPITAL_COMMUNITY): Payer: Self-pay | Admitting: Psychiatry

## 2016-09-12 DIAGNOSIS — F331 Major depressive disorder, recurrent, moderate: Secondary | ICD-10-CM

## 2016-09-24 ENCOUNTER — Ambulatory Visit: Payer: Self-pay | Admitting: Cardiovascular Disease

## 2016-09-24 ENCOUNTER — Other Ambulatory Visit (HOSPITAL_COMMUNITY): Payer: Self-pay

## 2016-09-25 ENCOUNTER — Telehealth: Payer: Self-pay

## 2016-09-25 NOTE — Telephone Encounter (Signed)
Pt had TAVR performed 08/30/2015.  The pt was scheduled for yearly TAVR follow-up on 09/24/16 but she cancelled these appointments and they were rescheduled to January.  I contacted the pt and rescheduled her evaluation to 10/19/16. Pt verbalized understanding of the importance of keeping these appointments for TAVR follow-up.

## 2016-09-30 ENCOUNTER — Other Ambulatory Visit: Payer: Self-pay | Admitting: Physician Assistant

## 2016-10-03 ENCOUNTER — Other Ambulatory Visit: Payer: Self-pay | Admitting: Physician Assistant

## 2016-10-11 ENCOUNTER — Other Ambulatory Visit (HOSPITAL_COMMUNITY): Payer: Self-pay | Admitting: Psychiatry

## 2016-10-11 DIAGNOSIS — F331 Major depressive disorder, recurrent, moderate: Secondary | ICD-10-CM

## 2016-10-15 ENCOUNTER — Other Ambulatory Visit (HOSPITAL_COMMUNITY): Payer: Self-pay

## 2016-10-15 DIAGNOSIS — F331 Major depressive disorder, recurrent, moderate: Secondary | ICD-10-CM

## 2016-10-15 MED ORDER — TRAZODONE HCL 50 MG PO TABS: 50.0000 mg | ORAL_TABLET | Freq: Every evening | ORAL | 0 refills | Status: DC | PRN

## 2016-10-16 ENCOUNTER — Ambulatory Visit (HOSPITAL_COMMUNITY): Payer: Self-pay | Admitting: Psychiatry

## 2016-10-18 ENCOUNTER — Telehealth: Payer: Self-pay | Admitting: Cardiology

## 2016-10-18 ENCOUNTER — Ambulatory Visit (INDEPENDENT_AMBULATORY_CARE_PROVIDER_SITE_OTHER): Payer: Medicare Other | Admitting: *Deleted

## 2016-10-18 DIAGNOSIS — I442 Atrioventricular block, complete: Secondary | ICD-10-CM | POA: Diagnosis not present

## 2016-10-18 NOTE — Progress Notes (Signed)
Remote pacemaker transmission.   

## 2016-10-18 NOTE — Telephone Encounter (Signed)
LMOVM reminding pt to send remote transmission.   

## 2016-10-19 ENCOUNTER — Ambulatory Visit (INDEPENDENT_AMBULATORY_CARE_PROVIDER_SITE_OTHER): Payer: Medicare Other | Admitting: Cardiovascular Disease

## 2016-10-19 ENCOUNTER — Encounter: Payer: Self-pay | Admitting: Cardiovascular Disease

## 2016-10-19 ENCOUNTER — Encounter (INDEPENDENT_AMBULATORY_CARE_PROVIDER_SITE_OTHER): Payer: Self-pay

## 2016-10-19 ENCOUNTER — Ambulatory Visit (HOSPITAL_COMMUNITY): Payer: Medicare Other | Attending: Internal Medicine

## 2016-10-19 ENCOUNTER — Other Ambulatory Visit: Payer: Self-pay

## 2016-10-19 VITALS — BP 96/54 | HR 68 | Ht 63.0 in | Wt 190.0 lb

## 2016-10-19 DIAGNOSIS — E785 Hyperlipidemia, unspecified: Secondary | ICD-10-CM | POA: Diagnosis not present

## 2016-10-19 DIAGNOSIS — I350 Nonrheumatic aortic (valve) stenosis: Secondary | ICD-10-CM | POA: Diagnosis not present

## 2016-10-19 DIAGNOSIS — I10 Essential (primary) hypertension: Secondary | ICD-10-CM | POA: Insufficient documentation

## 2016-10-19 DIAGNOSIS — Z952 Presence of prosthetic heart valve: Secondary | ICD-10-CM | POA: Diagnosis not present

## 2016-10-19 DIAGNOSIS — E119 Type 2 diabetes mellitus without complications: Secondary | ICD-10-CM | POA: Diagnosis not present

## 2016-10-19 DIAGNOSIS — Z950 Presence of cardiac pacemaker: Secondary | ICD-10-CM | POA: Diagnosis not present

## 2016-10-19 NOTE — Progress Notes (Signed)
Cardiology Office Note Date:  10/19/2016   ID:  Catherine [name], DOB 02/02/1941, MRN [id number]  PCP:  Catherine Panda, [name]  Cardiologist:  Catherine Mocha, [name]    Chief Complaint  Patient presents with  . aortic valve stenosis     History of Present Illness: Catherine [name] is a 76 y.o. female who presents for Follow-up of aortic valve disease after undergoing TAVR 08/30/2015 via an open transfemoral approach. She was treated with a 26 mm Catherine [name] transcatheter heart valve. Her valve replacement was complicated by development of heart block requiring permanent pacemaker implantation which was then complicated by pneumothorax requiring chest tube placement.  She is here with her husband today. She has not had any recent cardiopulmonary symptoms. She is followed very closely by her primary physician, Catherine [name], who she sees every [name] months. She is no longer physically active and in fact she is in a wheelchair today. She denies chest pain, chest pressure, shortness of breath, leg swelling, orthopnea, heart palpitations, or PND. She has struggled with depression over the past year and she is regularly seeing psychiatrist.  Past Medical History:  Diagnosis Date  . Acute colitis   . Anemia   . CAD (coronary artery disease)    a. heavy calcification of the entire Catherine [name] & mod eccentric mid Catherine [name] stenosis, estimated @ 70%, mild nonobs stenosis of Catherine [name] and Catherine [name], severely calcified Ao valve with restricted valve mobility and 2+ AI    . Cancer [name]) 1981   breast  . CHB (complete heart block) (Catherine [name]) 08/31/2015   Catherine [name] DR model [id number] (serial number [id number] )   . Chronic diastolic congestive heart failure (Catherine [name])    a. echo 08/31/2015: EF 65-70%, nl WM, GR1DD, Ao valve stent bioprosthesis was present and functioning nl, no regurg, LA mildly dilated, PASP 46 mm Hg  . Colon polyps   . Depression   . Diabetes mellitus type II   . Fibromyalgia   . HTN (hypertension)   .  Hypercholesteremia   . Hypothyroidism   . IBS (irritable bowel syndrome)   . Rectal bleeding   . Rheumatoid arthritis(714.0)   . S/P TAVR (transcatheter aortic valve replacement) 08/30/2015   26 mm Catherine [name] transcatheter heart valve placed via open right transfemoral approach  . Shortness of breath dyspnea   . Stenosis of aortic valve   . Thyroid disease     Past Surgical History:  Procedure Laterality Date  . ABDOMINAL SURGERY     Intestinal surgery  . CARDIAC SURGERY    . CESAREAN SECTION     x 2  . EP IMPLANTABLE DEVICE N/A 08/31/2015   Procedure: Pacemaker Implant;  Surgeon: Catherine [name]; [place] (serial number [id number] ); Laterality: Right  . LEFT OOPHORECTOMY    . MASTECTOMY Left   . polyp     . self inflicted chest wound    . Catherine [name]   lower back  . TEE WITHOUT CARDIOVERSION N/A 08/30/2015   Procedure: TRANSESOPHAGEAL ECHOCARDIOGRAM (TEE);  Surgeon: Catherine Mocha, [name];  Location: [name];  Service: Open Heart Surgery;  Laterality: N/A;  . TOTAL ABDOMINAL HYSTERECTOMY    . TRANSCATHETER AORTIC VALVE REPLACEMENT, TRANSFEMORAL N/A 08/30/2015   Procedure: TRANSCATHETER AORTIC VALVE REPLACEMENT, TRANSFEMORAL;  Surgeon: Catherine Mocha, [name];  Location: Catherine [name];  Service: Open Heart Surgery;  Laterality: N/A;    Current Outpatient Prescriptions  Medication Sig Dispense Refill  .  aspirin EC 81 MG tablet Take 81 mg by mouth daily.    Marland Kitchen atorvastatin (LIPITOR) 40 MG tablet Take 40 mg by mouth every evening.    Marland Kitchen buPROPion (WELLBUTRIN XL) 300 MG 24 hr tablet TAKE 1 TABLET (300 MG TOTAL) BY MOUTH DAILY. 30 tablet 2  . clonazePAM (KLONOPIN) 1 MG tablet Take 1 tablet (1 mg total) by mouth at bedtime. 30 tablet 2  . clopidogrel (PLAVIX) 75 MG tablet Take 75 mg by mouth every evening.    . clopidogrel (PLAVIX) 75 MG tablet TAKE 1 TABLET (75 MG TOTAL) BY MOUTH DAILY WITH BREAKFAST. 30 tablet 0  . KLOR-CON M10 10 MEQ tablet TAKE 1  TABLET BY MOUTH EVERY DAY 30 tablet 9  . levothyroxine (SYNTHROID, LEVOTHROID) 100 MCG tablet Take 100 mcg by mouth daily before breakfast.     . Linagliptin-Metformin HCl (JENTADUETO) 2.5-500 MG TABS Take 0.5 tablets by mouth daily with breakfast.     . lisinopril (PRINIVIL,ZESTRIL) 10 MG tablet Take 10 mg by mouth.    Marland Kitchen lisinopril (PRINIVIL,ZESTRIL) 10 MG tablet TAKE 1 TABLET (10 MG TOTAL) BY MOUTH DAILY. 30 tablet 1  . metoprolol tartrate (LOPRESSOR) 25 MG tablet Take 25 mg by mouth daily.     . Multiple Vitamin (MULTIVITAMIN WITH MINERALS) TABS tablet Take 1 tablet by mouth daily at 12 noon.     . pantoprazole (PROTONIX) 40 MG tablet TAKE 1 TABLET BY MOUTH EVERY DAY 30 tablet 0  . PARoxetine (PAXIL) 20 MG tablet Take 1 tablet (20 mg total) by mouth daily. 30 tablet 2  . tamsulosin (FLOMAX) 0.4 MG CAPS capsule Take 0.4 mg by mouth daily after breakfast.    . traZODone (DESYREL) 50 MG tablet Take 1 tablet (50 mg total) by mouth at bedtime as needed for sleep. 30 tablet 0   No current facility-administered medications for this visit.     Allergies:   Sulfa antibiotics and Latex   Social History:  The patient  reports that she has never smoked. She has never used smokeless tobacco. She reports that she does not drink alcohol or use drugs.   Family History:  The patient's  family history includes Depression in her sister, sister, and sister; Hypertension in her mother and sister; Lung cancer in her sister; Stroke in her sister.    ROS:  Please see the history of present illness.  Otherwise, review of systems is positive for Depression, back pain, rash, easy bruising, leg pain, snoring, constipation, anxiety, difficulty urinating, headaches.  All other systems are reviewed and negative.    PHYSICAL EXAM: VS:  BP (!) 96/54   Pulse 68   Ht 5\' [name]"  (1.6 m)   Wt 190 lb (86.2 kg)   BMI 33.66 kg/m  , BMI Body mass index is 33.66 kg/m. GEN: Pleasant elderly woman, in no acute distress  HEENT:  normal  Neck: no JVD, no masses. No carotid bruits Cardiac: RRR without murmur or gallop                Respiratory:  clear to auscultation bilaterally, normal work of breathing GI: soft, nontender, nondistended, + BS MS: no deformity or atrophy  Ext: Trace pretibial edema bilaterally Skin: warm and dry, no rash Neuro:  Strength and sensation are intact Psych: euthymic mood, full affect  EKG:  EKG is ordered today. The ekg ordered today shows sinus rhythm 69 bpm, right bundle branch block, left axis deviation.  Recent Labs: 03/06/2016: ALT 10; BUN 23; Creatinine,  Ser 0.89; Hemoglobin 14.[name]; Platelets 320; Potassium [name].8; Sodium 134   Lipid Panel  No results found for: CHOL, TRIG, HDL, CHOLHDL, VLDL, LDLCALC, LDLDIRECT    Wt Readings from Last [name] Encounters:  10/19/16 190 lb (86.2 kg)  07/19/16 195 lb (88.5 kg)  03/06/16 185 lb (83.9 kg)     Cardiac Studies Reviewed: 2D Echo: Formal interpretation pending. On my review, LV function appears vigorous with normal LVEF. The patient's aortic valve prosthesis appears to be functioning normally with a mean gradient of 7 mmHg and a peak gradient of 14 mmHg. There is no paravalvular leak appreciated.  ASSESSMENT AND PLAN: 1.  Aortic valve disease status post TAVR: The patient's echo was reviewed and shows normal valve function as well as normal LV function. Difficult to ascertain her New York Heart Association functional class because of general limitation unrelated to her cardiac status. Suspect she is NYHA functional class II. She has 12 months out from TAVR and can discontinue clopidogrel. She should remain on lifelong aspirin 81 mg.  2. Hypertension: Blood pressure is low today. This appears to be an isolated event that she has not had problems with lightheadedness or syncope. Her blood pressure at recent primary care visit was 126/61. Advised to maintain adequate hydration and continue to monitor. She remains on lisinopril and  metoprolol.  [name]. Complete AV block status post permanent pacemaker placement: Followed regularly by Dr. Curt Bears.  4. Type 2 diabetes: Followed by primary care physician. Recent hemoglobin A1c was 6.0.  5. Hyperlipidemia: Treated with atorvastatin 40 mg.  Current medicines are reviewed with the patient today.  The patient does not have concerns regarding medicines.  Labs/ tests ordered today include:  No orders of the defined types were placed in this encounter.   Disposition:   FU one year  Signed, Catherine Mocha, [name]  10/19/2016 11:48 AM    [address], [place], [place]  [address] Phone: [phone]; Fax: [phone]

## 2016-10-19 NOTE — Patient Instructions (Addendum)
Medication Instructions:  Your physician has recommended you make the following change in your medication:  1. STOP Plavix  Labwork: No new orders.   Testing/Procedures: No new orders.   Follow-Up: Your physician wants you to follow-up in: 1 YEAR with Dr Burt Knack.  You will receive a reminder letter in the mail two months in advance. If you don't receive a letter, please call our office to schedule the follow-up appointment.   Any Other Special Instructions Will Be Listed Below (If Applicable).  Your physician has requested that you regularly monitor and record your blood pressure readings at home. Please use the same machine at the same time of day to check your readings and record them to bring to your follow-up visit.  If you need a refill on your cardiac medications before your next appointment, please call your pharmacy.

## 2016-10-24 ENCOUNTER — Encounter: Payer: Self-pay | Admitting: Cardiology

## 2016-10-29 LAB — CUP PACEART REMOTE DEVICE CHECK
Battery Remaining Longevity: 130 mo
Battery Voltage: 3.01 V
Brady Statistic AS VP Percent: 1 %
Brady Statistic AS VS Percent: 94 %
Brady Statistic RA Percent Paced: 5.2 %
Implantable Lead Implant Date: 20160928
Implantable Lead Location: 753860
Implantable Pulse Generator Implant Date: 20160928
Lead Channel Impedance Value: 450 Ohm
Lead Channel Pacing Threshold Amplitude: 0.75 V
Lead Channel Pacing Threshold Pulse Width: 0.4 ms
Lead Channel Pacing Threshold Pulse Width: 0.4 ms
MDC IDC LEAD IMPLANT DT: 20160928
MDC IDC LEAD LOCATION: 753859
MDC IDC MSMT BATTERY REMAINING PERCENTAGE: 95.5 %
MDC IDC MSMT LEADCHNL RA SENSING INTR AMPL: 3.9 mV
MDC IDC MSMT LEADCHNL RV IMPEDANCE VALUE: 490 Ohm
MDC IDC MSMT LEADCHNL RV PACING THRESHOLD AMPLITUDE: 0.75 V
MDC IDC MSMT LEADCHNL RV SENSING INTR AMPL: 12 mV
MDC IDC PG SERIAL: 7779295
MDC IDC SESS DTM: 20171116163400
MDC IDC SET LEADCHNL RA PACING AMPLITUDE: 2 V
MDC IDC SET LEADCHNL RV PACING AMPLITUDE: 2.5 V
MDC IDC SET LEADCHNL RV PACING PULSEWIDTH: 0.4 ms
MDC IDC SET LEADCHNL RV SENSING SENSITIVITY: 2.5 mV
MDC IDC STAT BRADY AP VP PERCENT: 1 %
MDC IDC STAT BRADY AP VS PERCENT: 4.7 %
MDC IDC STAT BRADY RV PERCENT PACED: 1.5 %

## 2016-11-12 ENCOUNTER — Ambulatory Visit (HOSPITAL_COMMUNITY): Payer: Self-pay | Admitting: Psychiatry

## 2016-11-13 ENCOUNTER — Other Ambulatory Visit (HOSPITAL_COMMUNITY): Payer: Self-pay | Admitting: Psychiatry

## 2016-11-13 DIAGNOSIS — F331 Major depressive disorder, recurrent, moderate: Secondary | ICD-10-CM

## 2016-11-13 MED ORDER — TRAZODONE HCL 50 MG PO TABS: 50.0000 mg | ORAL_TABLET | Freq: Every evening | ORAL | 0 refills | Status: DC | PRN

## 2016-11-13 MED ORDER — PAROXETINE HCL 20 MG PO TABS: 20.0000 mg | ORAL_TABLET | Freq: Every day | ORAL | 0 refills | Status: DC

## 2016-11-13 MED ORDER — BUPROPION HCL ER (XL) 300 MG PO TB24: 300.0000 mg | ORAL_TABLET | Freq: Every day | ORAL | 0 refills | Status: DC

## 2016-11-24 ENCOUNTER — Other Ambulatory Visit: Payer: Self-pay | Admitting: Cardiovascular Disease

## 2016-11-24 ENCOUNTER — Other Ambulatory Visit (HOSPITAL_COMMUNITY): Payer: Self-pay | Admitting: Psychiatry

## 2016-11-24 DIAGNOSIS — F331 Major depressive disorder, recurrent, moderate: Secondary | ICD-10-CM

## 2016-12-04 ENCOUNTER — Other Ambulatory Visit (HOSPITAL_COMMUNITY): Payer: Self-pay

## 2016-12-04 DIAGNOSIS — F331 Major depressive disorder, recurrent, moderate: Secondary | ICD-10-CM

## 2016-12-04 MED ORDER — CLONAZEPAM 1 MG PO TABS: 1.0000 mg | ORAL_TABLET | Freq: Every day | ORAL | 0 refills | Status: DC

## 2016-12-05 ENCOUNTER — Other Ambulatory Visit: Payer: Self-pay | Admitting: Physician Assistant

## 2016-12-06 ENCOUNTER — Ambulatory Visit (HOSPITAL_COMMUNITY): Payer: Self-pay | Admitting: Psychiatry

## 2016-12-11 ENCOUNTER — Encounter (HOSPITAL_COMMUNITY): Payer: Self-pay | Admitting: Psychiatry

## 2016-12-11 ENCOUNTER — Ambulatory Visit (INDEPENDENT_AMBULATORY_CARE_PROVIDER_SITE_OTHER): Payer: 59 | Admitting: Psychiatry

## 2016-12-11 DIAGNOSIS — Z91040 Latex allergy status: Secondary | ICD-10-CM

## 2016-12-11 DIAGNOSIS — Z801 Family history of malignant neoplasm of trachea, bronchus and lung: Secondary | ICD-10-CM

## 2016-12-11 DIAGNOSIS — F331 Major depressive disorder, recurrent, moderate: Secondary | ICD-10-CM

## 2016-12-11 DIAGNOSIS — Z7982 Long term (current) use of aspirin: Secondary | ICD-10-CM

## 2016-12-11 DIAGNOSIS — Z823 Family history of stroke: Secondary | ICD-10-CM

## 2016-12-11 DIAGNOSIS — Z79899 Other long term (current) drug therapy: Secondary | ICD-10-CM

## 2016-12-11 DIAGNOSIS — Z8249 Family history of ischemic heart disease and other diseases of the circulatory system: Secondary | ICD-10-CM

## 2016-12-11 DIAGNOSIS — Z818 Family history of other mental and behavioral disorders: Secondary | ICD-10-CM

## 2016-12-11 DIAGNOSIS — Z882 Allergy status to sulfonamides status: Secondary | ICD-10-CM

## 2016-12-11 MED ORDER — PAROXETINE HCL 30 MG PO TABS: 30.0000 mg | ORAL_TABLET | Freq: Every day | ORAL | 2 refills | Status: DC

## 2016-12-11 MED ORDER — CLONAZEPAM 1 MG PO TABS: 1.0000 mg | ORAL_TABLET | Freq: Every day | ORAL | 2 refills | Status: DC

## 2016-12-11 MED ORDER — BUPROPION HCL ER (XL) 300 MG PO TB24: 300.0000 mg | ORAL_TABLET | Freq: Every day | ORAL | 2 refills | Status: DC

## 2016-12-11 MED ORDER — TRAZODONE HCL 50 MG PO TABS: 50.0000 mg | ORAL_TABLET | Freq: Every evening | ORAL | 2 refills | Status: DC | PRN

## 2016-12-11 NOTE — Progress Notes (Signed)
[facility] [facility] MD/PA/NP OP Progress Note  12/11/2016 1:41 PM Catherine [name]  MRN:  [id number]  Chief Complaint:  Chief Complaint    Follow-up     Subjective:  I don't know why I am crying.  HPI: Catherine [name] came for her follow-up appointment.  She is taking her medication and reported no side effects.  However she endorsed crying spells every day.  She is not sure why she is crying every day.  Today she came with her husband and her daughter who endorses that she misses her deceased sister.  Patient admitted that she misses her but she also acknowledge that she is in a better place.  She sleeping good.  She denies any feeling of hopelessness or worthlessness.  She is not interested in counseling and we have tried to see Legrand Pitts for grief counseling.  She has chronic pain and other health issues which are stable.  Patient continued to ruminate about her family issues and sometime feeling that she is burdened to her family.  However she also endorsed a good support from her daughter and husband.  She talks to her other sister every day.  She denies any hallucination or any paranoia.  She denies any mania or any psychosis.  She is taking Wellbutrin, trazodone, Paxil and trazodone.  She has no tremors shakes or any EPS.  Her appetite is okay.  Her vital signs are stable.  Visit Diagnosis:    ICD-9-CM ICD-10-CM   1. Moderate episode of recurrent major depressive disorder (HCC) 296.32 F33.1 traZODone (DESYREL) 50 MG tablet     PARoxetine (PAXIL) 30 MG tablet     clonazePAM (KLONOPIN) 1 MG tablet     buPROPion (WELLBUTRIN XL) 300 MG 24 hr tablet    Past Psychiatric History: Reviewed  Past Medical History:  Patient has multiple health issues which include hypothyroidism, diabetes mellitus, obesity, hypertension, arthritis, varicose vein, anemia, IBS, fibromyalgia and history of UTI.  Her primary care physician is Dr. Jilda Panda. Past Medical History:  Diagnosis Date  . Acute colitis   . Anemia   . CAD  (coronary artery disease)    a. heavy calcification of the entire LAD & mod eccentric mid LAD stenosis, estimated @ 70%, mild nonobs stenosis of RCA and LCx, severely calcified Ao valve with restricted valve mobility and 2+ AI    . Cancer [facility]) 1981   breast  . CHB (complete heart block) ([place]) 08/31/2015   [place] DR model [id number] (serial number [id number] )   . Chronic diastolic congestive heart failure ([place])    a. echo 08/31/2015: EF 65-70%, nl WM, GR1DD, Ao valve stent bioprosthesis was present and functioning nl, no regurg, LA mildly dilated, PASP 46 mm Hg  . Colon polyps   . Depression   . Diabetes mellitus type II   . Fibromyalgia   . HTN (hypertension)   . Hypercholesteremia   . Hypothyroidism   . IBS (irritable bowel syndrome)   . Rectal bleeding   . Rheumatoid arthritis(714.0)   . S/P TAVR (transcatheter aortic valve replacement) 08/30/2015   26 mm Edwards Sapien 3 transcatheter heart valve placed via open right transfemoral approach  . Shortness of breath dyspnea   . Stenosis of aortic valve   . Thyroid disease     Past Surgical History:  Procedure Laterality Date  . ABDOMINAL SURGERY     Intestinal surgery  . CARDIAC SURGERY    . CESAREAN SECTION     x 2  .  EP IMPLANTABLE DEVICE N/A 08/31/2015   Procedure: Pacemaker Implant;  Surgeon: Will Meredith Leeds, MD; [place] (serial number [id number] ); Laterality: Right  . LEFT OOPHORECTOMY    . MASTECTOMY Left   . polyp     . self inflicted chest wound    . [place]   lower back  . TEE WITHOUT CARDIOVERSION N/A 08/30/2015   Procedure: TRANSESOPHAGEAL ECHOCARDIOGRAM (TEE);  Surgeon: Sherren Mocha, MD;  Location: [place];  Service: Open Heart Surgery;  Laterality: N/A;  . TOTAL ABDOMINAL HYSTERECTOMY    . TRANSCATHETER AORTIC VALVE REPLACEMENT, TRANSFEMORAL N/A 08/30/2015   Procedure: TRANSCATHETER AORTIC VALVE REPLACEMENT, TRANSFEMORAL;  Surgeon: Sherren Mocha, MD;   Location: [place];  Service: Open Heart Surgery;  Laterality: N/A;    Family Psychiatric History: Reviewed.  Family History:  Family History  Problem Relation Age of Onset  . Depression Sister   . Depression Sister   . Depression Sister   . Hypertension Mother   . Lung cancer Sister   . Stroke Sister   . Hypertension Sister   . Heart attack Neg Hx     Social History:  Social History   Social History  . Marital status: Married    Spouse name: N/A  . Number of children: N/A  . Years of education: N/A   Social History Main Topics  . Smoking status: Never Smoker  . Smokeless tobacco: Never Used  . Alcohol use No  . Drug use: No  . Sexual activity: Not on file   Other Topics Concern  . Not on file   Social History Narrative  . No narrative on file    Allergies:  Allergies  Allergen Reactions  . Sulfa Antibiotics Hives  . Latex Rash    Metabolic Disorder Labs: Lab Results  Component Value Date   HGBA1C 6.5 (H) 08/25/2015   MPG 140 08/25/2015   No results found for: PROLACTIN No results found for: CHOL, TRIG, HDL, CHOLHDL, VLDL, LDLCALC   Current Medications: Current Outpatient Prescriptions  Medication Sig Dispense Refill  . aspirin EC 81 MG tablet Take 81 mg by mouth daily.    Marland Kitchen atorvastatin (LIPITOR) 40 MG tablet Take 40 mg by mouth every evening.    Marland Kitchen buPROPion (WELLBUTRIN XL) 300 MG 24 hr tablet Take 1 tablet (300 mg total) by mouth daily. 31 tablet 2  . clonazePAM (KLONOPIN) 1 MG tablet Take 1 tablet (1 mg total) by mouth at bedtime. 31 tablet 2  . KLOR-CON M10 10 MEQ tablet TAKE 1 TABLET BY MOUTH EVERY DAY 30 tablet 9  . levothyroxine (SYNTHROID, LEVOTHROID) 100 MCG tablet Take 100 mcg by mouth daily before breakfast.     . Linagliptin-Metformin HCl (JENTADUETO) 2.5-500 MG TABS Take 0.5 tablets by mouth daily with breakfast.     . lisinopril (PRINIVIL,ZESTRIL) 10 MG tablet TAKE 1 TABLET (10 MG TOTAL) BY MOUTH DAILY. 30 tablet 11  . metoprolol tartrate  (LOPRESSOR) 25 MG tablet Take 25 mg by mouth daily.     . Multiple Vitamin (MULTIVITAMIN WITH MINERALS) TABS tablet Take 1 tablet by mouth daily at 12 noon.     . pantoprazole (PROTONIX) 40 MG tablet TAKE 1 TABLET BY MOUTH EVERY DAY 30 tablet 11  . PARoxetine (PAXIL) 30 MG tablet Take 1 tablet (30 mg total) by mouth daily. 31 tablet 2  . tamsulosin (FLOMAX) 0.4 MG CAPS capsule Take 0.4 mg by mouth daily after breakfast.    .  traZODone (DESYREL) 50 MG tablet Take 1 tablet (50 mg total) by mouth at bedtime as needed for sleep. 31 tablet 2   No current facility-administered medications for this visit.     Neurologic: Headache: No Seizure: No Paresthesias: No  Musculoskeletal: Strength & Muscle Tone: decreased Gait & Station: unsteady Patient leans: Front and Backward  Psychiatric Specialty Exam: Review of Systems  Constitutional: Negative.   Musculoskeletal: Positive for back pain and joint pain.  Psychiatric/Behavioral: The patient is nervous/anxious.     There were no vitals taken for this visit.There is no height or weight on file to calculate BMI.  General Appearance: Casual  Eye Contact:  Fair  Speech:  Normal Rate  Volume:  Normal  Mood:  Anxious  Affect:  Depressed  Thought Process:  Goal Directed  Orientation:  Full (Time, Place, and Person)  Thought Content: Logical and Rumination   Suicidal Thoughts:  No  Homicidal Thoughts:  No  Memory:  Immediate;   Fair Recent;   Fair Remote;   Fair  Judgement:  Fair  Insight:  Good  Psychomotor Activity:  EPS  Concentration:  Concentration: Fair and Attention Span: Fair  Recall:  [employer] of Knowledge: Good  Language: Good  Akathisia:  No  Handed:  Right  AIMS (if indicated):  None reported   Assets:  Communication Skills Desire for Improvement Housing Social Support  ADL's:  Intact  Cognition: WNL  Sleep:  Good     Assessment: Catherine [name] is 76 year old Caucasian female with history of major depressive disorder,  recurrent.  Patient experiencing increased crying spells in the morning.  She is compliant with medication and reported no side effects.  Plan: I discussed increase Paxil 30 mg to help with residual mood lability and depression.  Patient and her husband agree with the plan.  I will continue Wellbutrin XL 300 mg daily, Klonopin 1 mg at bedtime and trazodone 50 mg at bedtime.  Discussed medication side effects and benefits.  Discuss serotonin syndrome with increase and multiple antidepressant.  One more time I offered counseling but patient declined.  Recommended to call us back if she has any question, concern if she feels worsening of the symptom.  Discuss safety plan that anytime having active suicidal thoughts or homicidal thoughts and she need to call 911 or go to the local emergency room.  Follow-up in 3 months.  Shamone Winzer T., MD 12/11/2016, 1:41 PM

## 2016-12-12 ENCOUNTER — Ambulatory Visit: Payer: Self-pay | Admitting: Cardiovascular Disease

## 2016-12-12 ENCOUNTER — Other Ambulatory Visit (HOSPITAL_COMMUNITY): Payer: Self-pay

## 2016-12-27 ENCOUNTER — Other Ambulatory Visit (HOSPITAL_COMMUNITY): Payer: Self-pay | Admitting: Psychiatry

## 2016-12-27 DIAGNOSIS — F331 Major depressive disorder, recurrent, moderate: Secondary | ICD-10-CM

## 2017-01-02 ENCOUNTER — Encounter (HOSPITAL_COMMUNITY): Payer: Self-pay | Admitting: Psychology

## 2017-01-02 NOTE — Progress Notes (Signed)
Catherine [name] is a 76 y.o. female patient who is not seeking counseling.  Outpatient Therapist Discharge Summary  Catherine [name]    12-Mar-1941   Admission Date: 08/02/16   Discharge Date:  01/02/17 Reason for Discharge:  Not seeking counseling Diagnosis:  MDD  Comments:  Pt on intake informed that not wanting counseling.  Counselor discussed services available and to seek care if symptoms worsen.  Pt on 12/11/16 appointment w/ Dr. Adele Schilder informed not coming for counseling. Pt to continue w/ Dr. Adele Schilder.  Jenne Campus, LPC

## 2017-01-11 ENCOUNTER — Other Ambulatory Visit (HOSPITAL_COMMUNITY): Payer: Self-pay | Admitting: Psychiatry

## 2017-01-11 DIAGNOSIS — F331 Major depressive disorder, recurrent, moderate: Secondary | ICD-10-CM

## 2017-01-17 ENCOUNTER — Ambulatory Visit (INDEPENDENT_AMBULATORY_CARE_PROVIDER_SITE_OTHER): Payer: Medicare Other | Admitting: *Deleted

## 2017-01-17 DIAGNOSIS — I442 Atrioventricular block, complete: Secondary | ICD-10-CM | POA: Diagnosis not present

## 2017-01-17 NOTE — Progress Notes (Signed)
Remote pacemaker transmission.   

## 2017-01-22 ENCOUNTER — Encounter: Payer: Self-pay | Admitting: Cardiology

## 2017-01-25 LAB — CUP PACEART REMOTE DEVICE CHECK
Battery Remaining Percentage: 95.5 %
Battery Voltage: 3.01 V
Brady Statistic AP VP Percent: 1.4 %
Brady Statistic AP VS Percent: 5.7 %
Brady Statistic AS VS Percent: 92 %
Brady Statistic RV Percent Paced: 2.8 %
Implantable Lead Implant Date: 20160928
Implantable Lead Location: 753859
Implantable Pulse Generator Implant Date: 20160928
Lead Channel Impedance Value: 440 Ohm
Lead Channel Impedance Value: 490 Ohm
Lead Channel Pacing Threshold Amplitude: 0.75 V
Lead Channel Pacing Threshold Amplitude: 0.75 V
Lead Channel Pacing Threshold Pulse Width: 0.4 ms
Lead Channel Sensing Intrinsic Amplitude: 4.5 mV
Lead Channel Setting Pacing Amplitude: 2.5 V
Lead Channel Setting Pacing Pulse Width: 0.4 ms
MDC IDC LEAD IMPLANT DT: 20160928
MDC IDC LEAD LOCATION: 753860
MDC IDC MSMT BATTERY REMAINING LONGEVITY: 128 mo
MDC IDC MSMT LEADCHNL RA PACING THRESHOLD PULSEWIDTH: 0.4 ms
MDC IDC MSMT LEADCHNL RV SENSING INTR AMPL: 12 mV
MDC IDC PG SERIAL: 7779295
MDC IDC SESS DTM: 20180215131758
MDC IDC SET LEADCHNL RA PACING AMPLITUDE: 2 V
MDC IDC SET LEADCHNL RV SENSING SENSITIVITY: 2.5 mV
MDC IDC STAT BRADY AS VP PERCENT: 1.4 %
MDC IDC STAT BRADY RA PERCENT PACED: 6.8 %

## 2017-03-07 ENCOUNTER — Other Ambulatory Visit (HOSPITAL_COMMUNITY): Payer: Self-pay | Admitting: Psychiatry

## 2017-03-07 DIAGNOSIS — F331 Major depressive disorder, recurrent, moderate: Secondary | ICD-10-CM

## 2017-03-09 ENCOUNTER — Observation Stay (HOSPITAL_COMMUNITY): Payer: Medicare Other

## 2017-03-09 ENCOUNTER — Observation Stay (HOSPITAL_COMMUNITY)
Admission: EM | Admit: 2017-03-09 | Discharge: 2017-03-12 | Disposition: A | Discharge status: Home / Self Care | Payer: Medicare Other | Attending: Internal Medicine | Admitting: Internal Medicine

## 2017-03-09 ENCOUNTER — Emergency Department (HOSPITAL_COMMUNITY): Payer: Medicare Other

## 2017-03-09 ENCOUNTER — Encounter (HOSPITAL_COMMUNITY): Payer: Self-pay | Admitting: *Deleted

## 2017-03-09 DIAGNOSIS — I700 Atherosclerosis of aorta: Secondary | ICD-10-CM | POA: Diagnosis not present

## 2017-03-09 DIAGNOSIS — E7800 Pure hypercholesterolemia, unspecified: Secondary | ICD-10-CM | POA: Insufficient documentation

## 2017-03-09 DIAGNOSIS — Z950 Presence of cardiac pacemaker: Secondary | ICD-10-CM | POA: Insufficient documentation

## 2017-03-09 DIAGNOSIS — H8110 Benign paroxysmal vertigo, unspecified ear: Principal | ICD-10-CM | POA: Insufficient documentation

## 2017-03-09 DIAGNOSIS — F329 Major depressive disorder, single episode, unspecified: Secondary | ICD-10-CM | POA: Diagnosis not present

## 2017-03-09 DIAGNOSIS — I442 Atrioventricular block, complete: Secondary | ICD-10-CM | POA: Diagnosis present

## 2017-03-09 DIAGNOSIS — M160 Bilateral primary osteoarthritis of hip: Secondary | ICD-10-CM | POA: Diagnosis not present

## 2017-03-09 DIAGNOSIS — E039 Hypothyroidism, unspecified: Secondary | ICD-10-CM | POA: Diagnosis present

## 2017-03-09 DIAGNOSIS — I639 Cerebral infarction, unspecified: Secondary | ICD-10-CM

## 2017-03-09 DIAGNOSIS — I10 Essential (primary) hypertension: Secondary | ICD-10-CM

## 2017-03-09 DIAGNOSIS — G458 Other transient cerebral ischemic attacks and related syndromes: Secondary | ICD-10-CM | POA: Diagnosis not present

## 2017-03-09 DIAGNOSIS — Z952 Presence of prosthetic heart valve: Secondary | ICD-10-CM | POA: Diagnosis not present

## 2017-03-09 DIAGNOSIS — R197 Diarrhea, unspecified: Secondary | ICD-10-CM | POA: Insufficient documentation

## 2017-03-09 DIAGNOSIS — M069 Rheumatoid arthritis, unspecified: Secondary | ICD-10-CM | POA: Diagnosis present

## 2017-03-09 DIAGNOSIS — K580 Irritable bowel syndrome with diarrhea: Secondary | ICD-10-CM

## 2017-03-09 DIAGNOSIS — Z7982 Long term (current) use of aspirin: Secondary | ICD-10-CM | POA: Insufficient documentation

## 2017-03-09 DIAGNOSIS — R300 Dysuria: Secondary | ICD-10-CM | POA: Insufficient documentation

## 2017-03-09 DIAGNOSIS — R531 Weakness: Secondary | ICD-10-CM | POA: Insufficient documentation

## 2017-03-09 DIAGNOSIS — Z79899 Other long term (current) drug therapy: Secondary | ICD-10-CM | POA: Insufficient documentation

## 2017-03-09 DIAGNOSIS — I6523 Occlusion and stenosis of bilateral carotid arteries: Secondary | ICD-10-CM | POA: Insufficient documentation

## 2017-03-09 DIAGNOSIS — I350 Nonrheumatic aortic (valve) stenosis: Secondary | ICD-10-CM

## 2017-03-09 DIAGNOSIS — E876 Hypokalemia: Secondary | ICD-10-CM | POA: Insufficient documentation

## 2017-03-09 DIAGNOSIS — E038 Other specified hypothyroidism: Secondary | ICD-10-CM | POA: Diagnosis not present

## 2017-03-09 DIAGNOSIS — E119 Type 2 diabetes mellitus without complications: Secondary | ICD-10-CM | POA: Diagnosis not present

## 2017-03-09 DIAGNOSIS — M797 Fibromyalgia: Secondary | ICD-10-CM | POA: Diagnosis not present

## 2017-03-09 DIAGNOSIS — M542 Cervicalgia: Secondary | ICD-10-CM | POA: Insufficient documentation

## 2017-03-09 DIAGNOSIS — M19011 Primary osteoarthritis, right shoulder: Secondary | ICD-10-CM | POA: Insufficient documentation

## 2017-03-09 DIAGNOSIS — I110 Hypertensive heart disease with heart failure: Secondary | ICD-10-CM | POA: Insufficient documentation

## 2017-03-09 DIAGNOSIS — I5032 Chronic diastolic (congestive) heart failure: Secondary | ICD-10-CM | POA: Diagnosis not present

## 2017-03-09 DIAGNOSIS — M7989 Other specified soft tissue disorders: Secondary | ICD-10-CM | POA: Insufficient documentation

## 2017-03-09 DIAGNOSIS — R110 Nausea: Secondary | ICD-10-CM | POA: Insufficient documentation

## 2017-03-09 DIAGNOSIS — R51 Headache: Secondary | ICD-10-CM | POA: Insufficient documentation

## 2017-03-09 DIAGNOSIS — Z853 Personal history of malignant neoplasm of breast: Secondary | ICD-10-CM | POA: Insufficient documentation

## 2017-03-09 DIAGNOSIS — G459 Transient cerebral ischemic attack, unspecified: Secondary | ICD-10-CM | POA: Diagnosis present

## 2017-03-09 DIAGNOSIS — I452 Bifascicular block: Secondary | ICD-10-CM | POA: Insufficient documentation

## 2017-03-09 DIAGNOSIS — F3341 Major depressive disorder, recurrent, in partial remission: Secondary | ICD-10-CM | POA: Diagnosis not present

## 2017-03-09 DIAGNOSIS — Z791 Long term (current) use of non-steroidal anti-inflammatories (NSAID): Secondary | ICD-10-CM | POA: Insufficient documentation

## 2017-03-09 DIAGNOSIS — M19012 Primary osteoarthritis, left shoulder: Secondary | ICD-10-CM | POA: Insufficient documentation

## 2017-03-09 DIAGNOSIS — R42 Dizziness and giddiness: Secondary | ICD-10-CM | POA: Diagnosis present

## 2017-03-09 DIAGNOSIS — R200 Anesthesia of skin: Secondary | ICD-10-CM | POA: Diagnosis present

## 2017-03-09 DIAGNOSIS — E785 Hyperlipidemia, unspecified: Secondary | ICD-10-CM | POA: Insufficient documentation

## 2017-03-09 DIAGNOSIS — D649 Anemia, unspecified: Secondary | ICD-10-CM | POA: Insufficient documentation

## 2017-03-09 DIAGNOSIS — K589 Irritable bowel syndrome without diarrhea: Secondary | ICD-10-CM | POA: Diagnosis not present

## 2017-03-09 DIAGNOSIS — Z86010 Personal history of colonic polyps: Secondary | ICD-10-CM | POA: Insufficient documentation

## 2017-03-09 DIAGNOSIS — I2510 Atherosclerotic heart disease of native coronary artery without angina pectoris: Secondary | ICD-10-CM | POA: Insufficient documentation

## 2017-03-09 DIAGNOSIS — H532 Diplopia: Secondary | ICD-10-CM | POA: Insufficient documentation

## 2017-03-09 DIAGNOSIS — Z8719 Personal history of other diseases of the digestive system: Secondary | ICD-10-CM | POA: Insufficient documentation

## 2017-03-09 DIAGNOSIS — F332 Major depressive disorder, recurrent severe without psychotic features: Secondary | ICD-10-CM | POA: Insufficient documentation

## 2017-03-09 DIAGNOSIS — R109 Unspecified abdominal pain: Secondary | ICD-10-CM

## 2017-03-09 DIAGNOSIS — I4510 Unspecified right bundle-branch block: Secondary | ICD-10-CM | POA: Insufficient documentation

## 2017-03-09 DIAGNOSIS — H8113 Benign paroxysmal vertigo, bilateral: Secondary | ICD-10-CM

## 2017-03-09 DIAGNOSIS — R0602 Shortness of breath: Secondary | ICD-10-CM | POA: Insufficient documentation

## 2017-03-09 DIAGNOSIS — Z953 Presence of xenogenic heart valve: Secondary | ICD-10-CM

## 2017-03-09 LAB — RAPID URINE DRUG SCREEN, HOSP PERFORMED
Amphetamines: NOT DETECTED
BENZODIAZEPINES: NOT DETECTED
Barbiturates: NOT DETECTED
COCAINE: NOT DETECTED
OPIATES: NOT DETECTED
Tetrahydrocannabinol: NOT DETECTED

## 2017-03-09 LAB — I-STAT TROPONIN, ED: TROPONIN I, POC: 0 ng/mL (ref 0.00–0.08)

## 2017-03-09 LAB — COMPREHENSIVE METABOLIC PANEL
ALT: 10 U/L — ABNORMAL LOW (ref 14–54)
ANION GAP: 8 (ref 5–15)
AST: 18 U/L (ref 15–41)
Albumin: 3.3 g/dL — ABNORMAL LOW (ref 3.5–5.0)
Alkaline Phosphatase: 58 U/L (ref 38–126)
BUN: 13 mg/dL (ref 6–20)
CHLORIDE: 103 mmol/L (ref 101–111)
CO2: 27 mmol/L (ref 22–32)
Calcium: 9.1 mg/dL (ref 8.9–10.3)
Creatinine, Ser: 0.91 mg/dL (ref 0.44–1.00)
GFR calc Af Amer: >60 mL/min (ref >60)
GFR calc non Af Amer: 59 mL/min — ABNORMAL LOW (ref >60)
Glucose, Bld: 112 mg/dL — ABNORMAL HIGH (ref 65–99)
POTASSIUM: 3.8 mmol/L (ref 3.5–5.1)
Sodium: 138 mmol/L (ref 135–145)
Total Bilirubin: 0.2 mg/dL — ABNORMAL LOW (ref 0.3–1.2)
Total Protein: 6.8 g/dL (ref 6.5–8.1)

## 2017-03-09 LAB — CBC
HCT: 34.1 % — ABNORMAL LOW (ref 36.0–46.0)
Hemoglobin: 10.9 g/dL — ABNORMAL LOW (ref 12.0–15.0)
MCH: 27.4 pg (ref 26.0–34.0)
MCHC: 32 g/dL (ref 30.0–36.0)
MCV: 85.7 fL (ref 78.0–100.0)
Platelets: 236 10*3/uL (ref 150–400)
RBC: 3.98 MIL/uL (ref 3.87–5.11)
RDW: 14.1 % (ref 11.5–15.5)
WBC: 8.5 10*3/uL (ref 4.0–10.5)

## 2017-03-09 LAB — I-STAT CHEM 8, ED
BUN: 16 mg/dL (ref 6–20)
Calcium, Ion: 1.1 mmol/L — ABNORMAL LOW (ref 1.15–1.40)
Chloride: 101 mmol/L (ref 101–111)
Creatinine, Ser: 1 mg/dL (ref 0.44–1.00)
Glucose, Bld: 112 mg/dL — ABNORMAL HIGH (ref 65–99)
HEMATOCRIT: 32 % — AB (ref 36.0–46.0)
HEMOGLOBIN: 10.9 g/dL — AB (ref 12.0–15.0)
POTASSIUM: 3.8 mmol/L (ref 3.5–5.1)
Sodium: 139 mmol/L (ref 135–145)
TCO2: 28 mmol/L (ref 0–100)

## 2017-03-09 LAB — URINALYSIS, ROUTINE W REFLEX MICROSCOPIC
Bilirubin Urine: NEGATIVE
GLUCOSE, UA: 50 mg/dL — AB
Hgb urine dipstick: NEGATIVE
Ketones, ur: NEGATIVE mg/dL
Leukocytes, UA: NEGATIVE
Nitrite: NEGATIVE
PH: 5 (ref 5.0–8.0)
Protein, ur: NEGATIVE mg/dL
SPECIFIC GRAVITY, URINE: 1.014 (ref 1.005–1.030)

## 2017-03-09 LAB — DIFFERENTIAL
BASOS ABS: 0 10*3/uL (ref 0.0–0.1)
BASOS PCT: 0 %
EOS ABS: 0.3 10*3/uL (ref 0.0–0.7)
Eosinophils Relative: 3 %
Lymphocytes Relative: 21 %
Lymphs Abs: 1.8 10*3/uL (ref 0.7–4.0)
Monocytes Absolute: 0.8 10*3/uL (ref 0.1–1.0)
Monocytes Relative: 10 %
NEUTROS PCT: 66 %
Neutro Abs: 5.5 10*3/uL (ref 1.7–7.7)

## 2017-03-09 LAB — PROTIME-INR
INR: 1.04
Prothrombin Time: 13.6 seconds (ref 11.4–15.2)

## 2017-03-09 LAB — GLUCOSE, CAPILLARY: Glucose-Capillary: 167 mg/dL — ABNORMAL HIGH (ref 65–99)

## 2017-03-09 LAB — APTT: APTT: 28 s (ref 24–36)

## 2017-03-09 LAB — ETHANOL

## 2017-03-09 MED ORDER — BUPROPION HCL ER (XL) 150 MG PO TB24
300.0000 mg | ORAL_TABLET | Freq: Every day | ORAL | Status: DC
  Administered 2017-03-10 – 2017-03-12 (×3): 300 mg via ORAL
  Filled 2017-03-09 (×3): qty 2

## 2017-03-09 MED ORDER — PANTOPRAZOLE SODIUM 40 MG PO TBEC
40.0000 mg | DELAYED_RELEASE_TABLET | Freq: Every day | ORAL | Status: DC
Start: 2017-03-10 — End: 2017-03-12
  Administered 2017-03-10 – 2017-03-12 (×3): 40 mg via ORAL
  Filled 2017-03-09 (×3): qty 1

## 2017-03-09 MED ORDER — ACETAMINOPHEN 160 MG/5ML PO SOLN: 650.0000 mg | ORAL | Status: DC | PRN

## 2017-03-09 MED ORDER — INSULIN ASPART 100 UNIT/ML ~~LOC~~ SOLN: 0.0000 [IU] | Freq: Every day | SUBCUTANEOUS | Status: DC

## 2017-03-09 MED ORDER — DOCUSATE SODIUM 100 MG PO CAPS
100.0000 mg | ORAL_CAPSULE | Freq: Two times a day (BID) | ORAL | Status: DC
  Administered 2017-03-09 – 2017-03-12 (×6): 100 mg via ORAL
  Filled 2017-03-09 (×6): qty 1

## 2017-03-09 MED ORDER — PAROXETINE HCL 20 MG PO TABS
30.0000 mg | ORAL_TABLET | Freq: Every day | ORAL | Status: DC
  Administered 2017-03-09 – 2017-03-12 (×4): 30 mg via ORAL
  Filled 2017-03-09 (×2): qty 2
  Filled 2017-03-09: qty 1.5
  Filled 2017-03-09 (×2): qty 2

## 2017-03-09 MED ORDER — ASPIRIN EC 81 MG PO TBEC
81.0000 mg | DELAYED_RELEASE_TABLET | Freq: Every day | ORAL | Status: DC
  Administered 2017-03-10: 81 mg via ORAL
  Filled 2017-03-09: qty 1

## 2017-03-09 MED ORDER — ENOXAPARIN SODIUM 40 MG/0.4ML ~~LOC~~ SOLN
40.0000 mg | Freq: Every day | SUBCUTANEOUS | Status: DC
  Administered 2017-03-09 – 2017-03-11 (×3): 40 mg via SUBCUTANEOUS
  Filled 2017-03-09 (×3): qty 0.4

## 2017-03-09 MED ORDER — LEVOTHYROXINE SODIUM 100 MCG PO TABS
100.0000 ug | ORAL_TABLET | Freq: Every day | ORAL | Status: DC
  Administered 2017-03-10 – 2017-03-12 (×3): 100 ug via ORAL
  Filled 2017-03-09 (×3): qty 1

## 2017-03-09 MED ORDER — ADULT MULTIVITAMIN W/MINERALS CH
1.0000 | ORAL_TABLET | Freq: Every day | ORAL | Status: DC
  Administered 2017-03-10 – 2017-03-12 (×3): 1 via ORAL
  Filled 2017-03-09 (×3): qty 1

## 2017-03-09 MED ORDER — CLONAZEPAM 1 MG PO TABS
1.0000 mg | ORAL_TABLET | Freq: Every day | ORAL | Status: DC
  Administered 2017-03-09 – 2017-03-11 (×3): 1 mg via ORAL
  Filled 2017-03-09 (×3): qty 1

## 2017-03-09 MED ORDER — TRAZODONE HCL 50 MG PO TABS
50.0000 mg | ORAL_TABLET | Freq: Every evening | ORAL | Status: DC | PRN
  Administered 2017-03-09 – 2017-03-11 (×3): 50 mg via ORAL
  Filled 2017-03-09 (×3): qty 1

## 2017-03-09 MED ORDER — ATORVASTATIN CALCIUM 40 MG PO TABS: 40.0000 mg | ORAL_TABLET | Freq: Every evening | ORAL | Status: DC

## 2017-03-09 MED ORDER — INSULIN ASPART 100 UNIT/ML ~~LOC~~ SOLN
0.0000 [IU] | Freq: Three times a day (TID) | SUBCUTANEOUS | Status: DC
  Administered 2017-03-10: 3 [IU] via SUBCUTANEOUS
  Administered 2017-03-11 – 2017-03-12 (×5): 2 [IU] via SUBCUTANEOUS

## 2017-03-09 MED ORDER — SENNOSIDES-DOCUSATE SODIUM 8.6-50 MG PO TABS: 1.0000 | ORAL_TABLET | Freq: Every evening | ORAL | Status: DC | PRN

## 2017-03-09 MED ORDER — SODIUM CHLORIDE 0.9 % IV SOLN
INTRAVENOUS | Status: AC
  Administered 2017-03-09: 23:00:00 via INTRAVENOUS

## 2017-03-09 MED ORDER — ACETAMINOPHEN 325 MG PO TABS
650.0000 mg | ORAL_TABLET | ORAL | Status: DC | PRN
  Administered 2017-03-10 – 2017-03-11 (×2): 650 mg via ORAL
  Filled 2017-03-09 (×2): qty 2

## 2017-03-09 MED ORDER — STROKE: EARLY STAGES OF RECOVERY BOOK
Freq: Once | Status: AC
  Administered 2017-03-09: 23:00:00

## 2017-03-09 MED ORDER — ACETAMINOPHEN 650 MG RE SUPP: 650.0000 mg | RECTAL | Status: DC | PRN

## 2017-03-09 NOTE — ED Notes (Signed)
Attempted report x1. 

## 2017-03-09 NOTE — Consult Note (Signed)
Referring Physician: Dr. Sherry Ruffing    Chief Complaint: Vertigo and diplopia with left arm weakness and numbness  HPI: ALISEA MATTE is an 76 y.o. female who presented to the ED with new onset of vertigo, diplopia, left arm weakness and left arm numbness shortly after waking up at 7:30 AM on Saturday. She also experienced transient left sided headache and neck pain. Nausea at time of EMS transport was treated with Zofran. At the time of her initial ED evaluation, her diplopia had resolved. At the time of Neurology evaluation, her vertigo and LUE weakness have also resolved, but subjective LUE numbness is still present.    Her PMHx includes HTN, hypercholesterolemia, DM, CAD, chronic diastolic CHF, complete heart block, cardiac pacemaker, TAVR, prior breast cancer, hypothyrodism, fibromyalgia and RA. She has no prior history of stroke.   She has been off Plavix for several months. Home medications include ASA and Lipitor.   LSN: [id number] tPA Given: No: Out of tPA time window and symptoms almost completely resolved.   Past Medical History:  Diagnosis Date  . Acute colitis   . Anemia   . CAD (coronary artery disease)    a. heavy calcification of the entire LAD & mod eccentric mid LAD stenosis, estimated @ 70%, mild nonobs stenosis of RCA and LCx, severely calcified Ao valve with restricted valve mobility and 2+ AI    . Cancer [facility]) 1981   breast  . CHB (complete heart block) ([place]) 08/31/2015   [place] DR model SW5462 (serial number [id number] )   . Chronic diastolic congestive heart failure ([place])    a. echo 08/31/2015: EF 65-70%, nl WM, GR1DD, Ao valve stent bioprosthesis was present and functioning nl, no regurg, LA mildly dilated, PASP 46 mm Hg  . Colon polyps   . Depression   . Diabetes mellitus type II   . Fibromyalgia   . HTN (hypertension)   . Hypercholesteremia   . Hypothyroidism   . IBS (irritable bowel syndrome)   . Rectal bleeding   . Rheumatoid arthritis(714.0)   .  S/P TAVR (transcatheter aortic valve replacement) 08/30/2015   26 mm Edwards Sapien 3 transcatheter heart valve placed via open right transfemoral approach  . Shortness of breath dyspnea   . Stenosis of aortic valve   . Thyroid disease     Past Surgical History:  Procedure Laterality Date  . ABDOMINAL SURGERY     Intestinal surgery  . CARDIAC SURGERY    . CESAREAN SECTION     x 2  . EP IMPLANTABLE DEVICE N/A 08/31/2015   Procedure: Pacemaker Implant;  Surgeon: Will Meredith Leeds, MD; [place] (serial number [id number] ); Laterality: Right  . LEFT OOPHORECTOMY    . MASTECTOMY Left   . polyp     . self inflicted chest wound    . [place]   lower back  . TEE WITHOUT CARDIOVERSION N/A 08/30/2015   Procedure: TRANSESOPHAGEAL ECHOCARDIOGRAM (TEE);  Surgeon: Sherren Mocha, MD;  Location: [place];  Service: Open Heart Surgery;  Laterality: N/A;  . TOTAL ABDOMINAL HYSTERECTOMY    . TRANSCATHETER AORTIC VALVE REPLACEMENT, TRANSFEMORAL N/A 08/30/2015   Procedure: TRANSCATHETER AORTIC VALVE REPLACEMENT, TRANSFEMORAL;  Surgeon: Sherren Mocha, MD;  Location: [place];  Service: Open Heart Surgery;  Laterality: N/A;    Family History  Problem Relation Age of Onset  . Depression Sister   . Depression Sister   . Depression Sister   . Hypertension  Mother   . Lung cancer Sister   . Stroke Sister   . Hypertension Sister   . Heart attack Neg Hx    Social History:  reports that she has never smoked. She has never used smokeless tobacco. She reports that she does not drink alcohol or use drugs.  Allergies:  Allergies  Allergen Reactions  . Sulfa Antibiotics Hives  . Latex Rash    Medications:  Prior to Admission:  Prescriptions Prior to Admission  Medication Sig Dispense Refill Last Dose  . acetaminophen (TYLENOL) 325 MG tablet Take 650 mg by mouth every 6 (six) hours as needed for moderate pain.    at prn  . aspirin EC 81 MG tablet Take 81 mg by  mouth daily.   03/09/2017 at Unknown time  . atorvastatin (LIPITOR) 40 MG tablet Take 40 mg by mouth every evening.   03/09/2017 at Unknown time  . buPROPion (WELLBUTRIN XL) 300 MG 24 hr tablet Take 1 tablet (300 mg total) by mouth daily. 31 tablet 2 03/09/2017 at Unknown time  . clonazePAM (KLONOPIN) 1 MG tablet Take 1 tablet (1 mg total) by mouth at bedtime. 31 tablet 2 03/08/2017 at Unknown time  . docusate sodium (COLACE) 100 MG capsule Take 100 mg by mouth 2 (two) times daily.   03/09/2017 at Unknown time  . KLOR-CON M10 10 MEQ tablet TAKE 1 TABLET BY MOUTH EVERY DAY 30 tablet 9 03/09/2017 at Unknown time  . levothyroxine (SYNTHROID, LEVOTHROID) 100 MCG tablet Take 100 mcg by mouth daily before breakfast.    03/09/2017 at Unknown time  . Linagliptin-Metformin HCl (JENTADUETO) 2.5-500 MG TABS Take 0.5 tablets by mouth daily with breakfast.    03/08/2017 at Unknown time  . lisinopril (PRINIVIL,ZESTRIL) 10 MG tablet TAKE 1 TABLET (10 MG TOTAL) BY MOUTH DAILY. 30 tablet 11 03/09/2017 at Unknown time  . meloxicam (MOBIC) 7.5 MG tablet Take 7.5 mg by mouth daily.    at prn  . metoprolol tartrate (LOPRESSOR) 25 MG tablet Take 25 mg by mouth daily.    03/09/2017 at Unknown time  . Multiple Vitamin (MULTIVITAMIN WITH MINERALS) TABS tablet Take 1 tablet by mouth daily at 12 noon.    03/09/2017 at Unknown time  . pantoprazole (PROTONIX) 40 MG tablet TAKE 1 TABLET BY MOUTH EVERY DAY 30 tablet 11 03/09/2017 at Unknown time  . PARoxetine (PAXIL) 30 MG tablet Take 1 tablet (30 mg total) by mouth daily. 31 tablet 2 03/08/2017 at Unknown time  . tamsulosin (FLOMAX) 0.4 MG CAPS capsule Take 0.4 mg by mouth daily after breakfast.   03/09/2017 at Unknown time  . traZODone (DESYREL) 50 MG tablet Take 1 tablet (50 mg total) by mouth at bedtime as needed for sleep. 31 tablet 2 03/08/2017 at Unknown time   Scheduled: . aspirin EC  81 mg Oral Daily  . atorvastatin  40 mg Oral QPM  . buPROPion  300 mg Oral Daily  . clonazePAM  1 mg Oral QHS  .  docusate sodium  100 mg Oral BID  . enoxaparin (LOVENOX) injection  40 mg Subcutaneous QHS  . insulin aspart  0-15 Units Subcutaneous TID WC  . insulin aspart  0-5 Units Subcutaneous QHS  . levothyroxine  100 mcg Oral QAC breakfast  . multivitamin with minerals  1 tablet Oral Q1200  . pantoprazole  40 mg Oral Daily  . PARoxetine  30 mg Oral Daily    ROS: No CP, SOB or palpitations. No fevers or chills. Positive for  dysuria.   Physical Examination: BP (!) 109/50 (BP Location: Right Arm)   Pulse 62   Temp 97.9 F (36.6 C) (Oral)   Resp 18   Ht 5\' 4"  (1.626 m)   Wt 88.9 kg (196 lb)   SpO2 98%   BMI 33.64 kg/m   HEENT: [place]/AT Lungs: Respirations unlabored Ext: Warm and well perfused  Neurologic Examination: Mental Status: Alert, fully oriented, thought content appropriate.  Speech fluent with intact naming, repetition and comprehension. Able to follow all commands without difficulty. Cranial Nerves: II:  Visual fields intact, PERRL III,IV, VI: ptosis not present, EOMI without nystagmus V,VII: smile symmetric, decreased temperature sensation to right face VIII: hearing intact to conversation IX,X: No hoarseness or hypophonia XI: Symmetric XII: midline tongue extension  Motor: Right : Upper extremity   5/5    Left:     Upper extremity   5/5  Lower extremity   5/5     Lower extremity   5/5 Normal tone throughout; no atrophy noted Sensory: Decreased temperature sensation LUE and LLE. FT intact without extinction.   Deep Tendon Reflexes:  1+ bilateral brachioradialis and biceps. 1+ right patella, 0 left patella, 0 achilles bilaterally Toes downgoing.  Cerebellar: No ataxia with FNF bilaterally. Gait: Deferred  Results for orders placed or performed during the hospital encounter of 03/09/17 (from the past 48 hour(s))  Protime-INR     Status: None   Collection Time: 03/09/17  5:24 PM  Result Value Ref Range   Prothrombin Time 13.6 11.4 - 15.2 seconds   INR 1.04   APTT      Status: None   Collection Time: 03/09/17  5:24 PM  Result Value Ref Range   aPTT 28 24 - 36 seconds  CBC     Status: Abnormal   Collection Time: 03/09/17  5:24 PM  Result Value Ref Range   WBC 8.5 4.0 - 10.5 K/uL   RBC 3.98 3.87 - 5.11 MIL/uL   Hemoglobin 10.9 (L) 12.0 - 15.0 g/dL   HCT 34.1 (L) 36.0 - 46.0 %   MCV 85.7 78.0 - 100.0 fL   MCH 27.4 26.0 - 34.0 pg   MCHC 32.0 30.0 - 36.0 g/dL   RDW 14.1 11.5 - 15.5 %   Platelets 236 150 - 400 K/uL  Differential     Status: None   Collection Time: 03/09/17  5:24 PM  Result Value Ref Range   Neutrophils Relative % 66 %   Neutro Abs 5.5 1.7 - 7.7 K/uL   Lymphocytes Relative 21 %   Lymphs Abs 1.8 0.7 - 4.0 K/uL   Monocytes Relative 10 %   Monocytes Absolute 0.8 0.1 - 1.0 K/uL   Eosinophils Relative 3 %   Eosinophils Absolute 0.3 0.0 - 0.7 K/uL   Basophils Relative 0 %   Basophils Absolute 0.0 0.0 - 0.1 K/uL  Urine rapid drug screen (hosp performed)not at [facility]     Status: None   Collection Time: 03/09/17  5:24 PM  Result Value Ref Range   Opiates NONE DETECTED NONE DETECTED   Cocaine NONE DETECTED NONE DETECTED   Benzodiazepines NONE DETECTED NONE DETECTED   Amphetamines NONE DETECTED NONE DETECTED   Tetrahydrocannabinol NONE DETECTED NONE DETECTED   Barbiturates NONE DETECTED NONE DETECTED    Comment:        DRUG SCREEN FOR MEDICAL PURPOSES ONLY.  IF CONFIRMATION IS NEEDED FOR ANY PURPOSE, NOTIFY LAB WITHIN 5 DAYS.        LOWEST DETECTABLE  LIMITS FOR URINE DRUG SCREEN Drug Class       Cutoff (ng/mL) Amphetamine      1000 Barbiturate      200 Benzodiazepine   329 Tricyclics       518 Opiates          300 Cocaine          300 THC              50   Urinalysis, Routine w reflex microscopic     Status: Abnormal   Collection Time: 03/09/17  5:39 PM  Result Value Ref Range   Color, Urine YELLOW YELLOW   APPearance CLEAR CLEAR   Specific Gravity, Urine 1.014 1.005 - 1.030   pH 5.0 5.0 - 8.0   Glucose, UA 50 (A) NEGATIVE  mg/dL   Hgb urine dipstick NEGATIVE NEGATIVE   Bilirubin Urine NEGATIVE NEGATIVE   Ketones, ur NEGATIVE NEGATIVE mg/dL   Protein, ur NEGATIVE NEGATIVE mg/dL   Nitrite NEGATIVE NEGATIVE   Leukocytes, UA NEGATIVE NEGATIVE  I-Stat Chem 8, ED  (not at [facility], [facility])     Status: Abnormal   Collection Time: 03/09/17  6:50 PM  Result Value Ref Range   Sodium 139 135 - 145 mmol/L   Potassium 3.8 3.5 - 5.1 mmol/L   Chloride 101 101 - 111 mmol/L   BUN 16 6 - 20 mg/dL   Creatinine, Ser 1.00 0.44 - 1.00 mg/dL   Glucose, Bld 112 (H) 65 - 99 mg/dL   Calcium, Ion 1.10 (L) 1.15 - 1.40 mmol/L   TCO2 28 0 - 100 mmol/L   Hemoglobin 10.9 (L) 12.0 - 15.0 g/dL   HCT 32.0 (L) 36.0 - 46.0 %  I-stat troponin, ED (not at [facility], [facility])     Status: None   Collection Time: 03/09/17  7:01 PM  Result Value Ref Range   Troponin i, poc 0.00 0.00 - 0.08 ng/mL   Comment 3            Comment: Due to the release kinetics of cTnI, a negative result within the first hours of the onset of symptoms does not rule out myocardial infarction with certainty. If myocardial infarction is still suspected, repeat the test at appropriate intervals.    Ct Head Wo Contrast  Result Date: 03/09/2017 CLINICAL DATA:  Persistent dizziness beginning at 7:30 a.m. today. Resolved diplopia and LEFT arm weakness with mild residual LEFT arm numbness. History of diabetes, hypertension and hypercholesterolemia. EXAM: CT HEAD WITHOUT CONTRAST TECHNIQUE: Contiguous axial images were obtained from the base of the skull through the vertex without intravenous contrast. COMPARISON:  CT HEAD February 24, 2016 FINDINGS: BRAIN: No intraparenchymal hemorrhage, mass effect nor midline shift. The ventricles and sulci are normal for age. Patchy supratentorial white matter hypodensities less than expected for patient's age, though non-specific are most compatible with chronic small vessel ischemic disease. No acute large vascular territory infarcts. No abnormal  extra-axial fluid collections. Basal cisterns are patent. VASCULAR: Mild to moderate calcific atherosclerosis of the carotid siphons. SKULL: No skull fracture. Moderate temporomandibular osteoarthrosis. No significant scalp soft tissue swelling. SINUSES/ORBITS: Mild paranasal sinus mucosal thickening. Mastoid air cells are well aerated. The included ocular globes and orbital contents are non-suspicious. OTHER: Patient is edentulous. IMPRESSION: Normal CT HEAD for age. Electronically Signed   By: Elon Alas M.D.   On: 03/09/2017 17:56    Assessment: 76 y.o. female presenting with vertigo and diplopia in conjunction with left arm weakness and sensory numbness  1. Exam reveals decreased temperature sensation to LUE and LLE. No residual weakness. Vertigo has resolved. Symptoms may have been secondary to a posterior circulation TIA. Persistent sensory deficit suggests residual ischemic infarction involving the right thalamus. Above most consistent with embolism or thrombosis involving the posterior circulation. Cardiac source should be ruled out.  2. Stroke Risk Factors - hypercholesterolemia, HTN, age, CHF, CAD and DM.  3. Elevated TSH.  4. RBBB and LAFB on EKG. No atrial fibrillation.   Plan: 1. TTE. If negative for mural thrombus or valvular vegetation, obtain TEE.  2. CTA of head and neck to evaluate for possible critical stenosis.  3. Repeat CT head in > 3 days to assess for possible thalamic infarction not detectable on initial CT. 4. PT consult, OT consult, Speech consult 5. Continue ASA and atorvastatin.  6. Permissive HTN x 24-48 hours 7. Telemetry monitoring 8. Frequent neuro checks 9. Unable to perform MRI due to pacemaker.  10. If patient is able to tolerate Plavix, this may need to be restarted (ASA and Plavix dual antiplatelet therapy for secondary stroke prevention).  11. HgbA1c, fasting lipid panel 12. Titration of synthroid in context of elevated TSH this  admission  @Electronically  signed: Dr. Kerney Elbe  03/09/2017, 7:17 PM

## 2017-03-09 NOTE — H&P (Signed)
History and Physical    Catherine [name]:[id number] DOB: 1940-12-04 DOA: 03/09/2017  PCP: Jilda Panda, MD  Patient coming from: Home  Chief Complaint: Dizziness, arm numbness  HPI: Catherine [name] is a 76 y.o. female with medical history significant of Hypothyroidism, AS s/p TAVR, RA/FM, HTN, DM2, dCHF, CAD, h/o breast cancer in 1980 who presents for acute neurological symptoms.  Catherine [name] reports that she was in her normal state of health this morning until about 730am when she acutely developed room spinning sensation, diplopia and left arm weakness and numbness.  She had not had symptoms like this before.  She did not lose consciousness.  She is mostly sedentary due to generalized weakness and knee pain due to arthritis, so she did not notice any leg weakness.  She also reported left head an neck pain. She reported to the ED and the symptoms slowly resolved. She continues to have vertiginous symptoms with moving her eyes, mainly to the left, and some heaviness and tingling in her left arm.  She has a history of TAVR and has been off of Plavix now since November of 2017 based on review of Cardiology notes.  Her husband notes that she regularly seems to have word finding difficulties in the morning, but this has been going on for a few months.  She has chronic mild SOB with activity.  She has swelling in the legs that come and go.  About 4 days ago she had some left sided abdominal pain with diarrhea and 2 days ago she had chills all day that then resolved.    ED Course: In the ED, she was noted to have some mild anemia and glucose of 112.  She had a CT of the head which did not show any acute changes.  Unfortunately, she has a pacemaker which is not compatible with the MRI.  Neurology was consulted.    Review of Systems: As per HPI otherwise 10 point review of systems negative.    Past Medical History:  Diagnosis Date  . Acute colitis   . Anemia   . CAD (coronary artery disease)    a. heavy  calcification of the entire LAD & mod eccentric mid LAD stenosis, estimated @ 70%, mild nonobs stenosis of RCA and LCx, severely calcified Ao valve with restricted valve mobility and 2+ AI    . Cancer [facility]) 1981   breast  . CHB (complete heart block) ([place]) 08/31/2015   [place] DR model IR4431 (serial number [id number] )   . Chronic diastolic congestive heart failure ([place])    a. echo 08/31/2015: EF 65-70%, nl WM, GR1DD, Ao valve stent bioprosthesis was present and functioning nl, no regurg, LA mildly dilated, PASP 46 mm Hg  . Colon polyps   . Depression   . Diabetes mellitus type II   . Fibromyalgia   . HTN (hypertension)   . Hypercholesteremia   . Hypothyroidism   . IBS (irritable bowel syndrome)   . Rectal bleeding   . Rheumatoid arthritis(714.0)   . S/P TAVR (transcatheter aortic valve replacement) 08/30/2015   26 mm Edwards Sapien 3 transcatheter heart valve placed via open right transfemoral approach  . Shortness of breath dyspnea   . Stenosis of aortic valve   . Thyroid disease     Past Surgical History:  Procedure Laterality Date  . ABDOMINAL SURGERY     Intestinal surgery  . CARDIAC SURGERY    . CESAREAN SECTION     x 2  .  EP IMPLANTABLE DEVICE N/A 08/31/2015   Procedure: Pacemaker Implant;  Surgeon: Will Meredith Leeds, MD; [place] (serial number [id number] ); Laterality: Right  . LEFT OOPHORECTOMY    . MASTECTOMY Left   . polyp     . self inflicted chest wound    . [place]   lower back  . TEE WITHOUT CARDIOVERSION N/A 08/30/2015   Procedure: TRANSESOPHAGEAL ECHOCARDIOGRAM (TEE);  Surgeon: Sherren Mocha, MD;  Location: [place];  Service: Open Heart Surgery;  Laterality: N/A;  . TOTAL ABDOMINAL HYSTERECTOMY    . TRANSCATHETER AORTIC VALVE REPLACEMENT, TRANSFEMORAL N/A 08/30/2015   Procedure: TRANSCATHETER AORTIC VALVE REPLACEMENT, TRANSFEMORAL;  Surgeon: Sherren Mocha, MD;  Location: [place];  Service: Open Heart  Surgery;  Laterality: N/A;   Reviewed with patient  reports that she has never smoked. She has never used smokeless tobacco. She reports that she does not drink alcohol or use drugs.  Allergies  Allergen Reactions  . Sulfa Antibiotics Hives  . Latex Rash   Reviewed with patient.  Family History  Problem Relation Age of Onset  . Depression Sister   . Depression Sister   . Depression Sister   . Hypertension Mother   . Lung cancer Sister   . Stroke Sister   . Hypertension Sister   . Heart attack Neg Hx     Prior to Admission medications   Medication Sig Start Date End Date Taking? Authorizing Provider  acetaminophen (TYLENOL) 325 MG tablet Take 650 mg by mouth every 6 (six) hours as needed for moderate pain.   Yes Historical Provider, MD  aspirin EC 81 MG tablet Take 81 mg by mouth daily.   Yes Historical Provider, MD  atorvastatin (LIPITOR) 40 MG tablet Take 40 mg by mouth every evening.   Yes Historical Provider, MD  buPROPion (WELLBUTRIN XL) 300 MG 24 hr tablet Take 1 tablet (300 mg total) by mouth daily. 12/11/16  Yes Kathlee Nations, MD  clonazePAM (KLONOPIN) 1 MG tablet Take 1 tablet (1 mg total) by mouth at bedtime. 12/11/16  Yes Kathlee Nations, MD  docusate sodium (COLACE) 100 MG capsule Take 100 mg by mouth 2 (two) times daily.   Yes Historical Provider, MD  KLOR-CON M10 10 MEQ tablet TAKE 1 TABLET BY MOUTH EVERY DAY 10/01/16  Yes Bhavinkumar Bhagat, PA  levothyroxine (SYNTHROID, LEVOTHROID) 100 MCG tablet Take 100 mcg by mouth daily before breakfast.    Yes Historical Provider, MD  Linagliptin-Metformin HCl (JENTADUETO) 2.5-500 MG TABS Take 0.5 tablets by mouth daily with breakfast.    Yes Historical Provider, MD  lisinopril (PRINIVIL,ZESTRIL) 10 MG tablet TAKE 1 TABLET (10 MG TOTAL) BY MOUTH DAILY. 12/05/16  Yes Sherren Mocha, MD  meloxicam (MOBIC) 7.5 MG tablet Take 7.5 mg by mouth daily. 01/01/17  Yes Historical Provider, MD  metoprolol tartrate (LOPRESSOR) 25 MG tablet Take 25  mg by mouth daily.    Yes Historical Provider, MD  Multiple Vitamin (MULTIVITAMIN WITH MINERALS) TABS tablet Take 1 tablet by mouth daily at 12 noon.    Yes Historical Provider, MD  pantoprazole (PROTONIX) 40 MG tablet TAKE 1 TABLET BY MOUTH EVERY DAY 11/27/16  Yes Sherren Mocha, MD  PARoxetine (PAXIL) 30 MG tablet Take 1 tablet (30 mg total) by mouth daily. 12/11/16  Yes Kathlee Nations, MD  tamsulosin (FLOMAX) 0.4 MG CAPS capsule Take 0.4 mg by mouth daily after breakfast.   Yes Historical Provider, MD  traZODone (DESYREL) 50  MG tablet Take 1 tablet (50 mg total) by mouth at bedtime as needed for sleep. 12/11/16  Yes Kathlee Nations, MD    Physical Exam: Vitals:   03/09/17 1930 03/09/17 1938 03/09/17 1945 03/09/17 1958  BP: (!) 108/56 (!) 108/56 (!) 106/52   Pulse: 63 69 65   Resp: 20 17 15    Temp:    98.2 F (36.8 C)  SpO2: 99% 99% 100%     Constitutional: NAD, calm, comfortable, lying in bed Vitals:   03/09/17 1930 03/09/17 1938 03/09/17 1945 03/09/17 1958  BP: (!) 108/56 (!) 108/56 (!) 106/52   Pulse: 63 69 65   Resp: 20 17 15    Temp:    98.2 F (36.8 C)  SpO2: 99% 99% 100%    Eyes: PERRL, EOMI, lids and conjunctivae normal ENMT: Mucous membranes are moist. \Normal dentition.  Neck: normal, supple\ Respiratory: CTAB, no wheezing, no crackles. Normal respiratory effort.  Cardiovascular: Regular rate and normal rhythm, no murmurs / rubs / gallops.  No carotid bruit Abdomen: + TTP on the left side in the upper and lower quadrant, she reports this has been there since her diarrhea 4 days ago.  ND, +BS Musculoskeletal: no clubbing / cyanosis.  Normal muscle tone.  Skin: no rashes, lesions, ulcers. She has significant varicosity of the legs Neurologic: CN 2-12 grossly intact, dizziness with leftward gaze.  No word finding difficulty. Sensation intact to light touch in the arms and legs. Strength 5/5 in all 4.  Normal coordination.   Psychiatric: Normal judgment and insight. Alert and  oriented x 3. Normal mood.    Labs on Admission: I have personally reviewed following labs and imaging studies  CBC:  Recent Labs Lab 03/09/17 1724 03/09/17 1850  WBC 8.5  --   NEUTROABS 5.5  --   HGB 10.9* 10.9*  HCT 34.1* 32.0*  MCV 85.7  --   PLT 236  --    Basic Metabolic Panel:  Recent Labs Lab 03/09/17 1724 03/09/17 1850  NA 138 139  K 3.8 3.8  CL 103 101  CO2 27  --   GLUCOSE 112* 112*  BUN 13 16  CREATININE 0.91 1.00  CALCIUM 9.1  --    GFR: CrCl cannot be calculated (Unknown ideal weight.). Liver Function Tests:  Recent Labs Lab 03/09/17 1724  AST 18  ALT 10*  ALKPHOS 58  BILITOT 0.2*  PROT 6.8  ALBUMIN 3.3*   No results for input(s): LIPASE, AMYLASE in the last 168 hours. No results for input(s): AMMONIA in the last 168 hours. Coagulation Profile:  Recent Labs Lab 03/09/17 1724  INR 1.04   Cardiac Enzymes: No results for input(s): CKTOTAL, CKMB, CKMBINDEX, TROPONINI in the last 168 hours. BNP (last 3 results) No results for input(s): PROBNP in the last 8760 hours. HbA1C: No results for input(s): HGBA1C in the last 72 hours. CBG: No results for input(s): GLUCAP in the last 168 hours. Lipid Profile: No results for input(s): CHOL, HDL, LDLCALC, TRIG, CHOLHDL, LDLDIRECT in the last 72 hours. Thyroid Function Tests: No results for input(s): TSH, T4TOTAL, FREET4, T3FREE, THYROIDAB in the last 72 hours. Anemia Panel: No results for input(s): VITAMINB12, FOLATE, FERRITIN, TIBC, IRON, RETICCTPCT in the last 72 hours. Urine analysis:    Component Value Date/Time   COLORURINE YELLOW 03/09/2017 1739   APPEARANCEUR CLEAR 03/09/2017 1739   APPEARANCEUR Clear 06/27/2014 1955   LABSPEC 1.014 03/09/2017 1739   LABSPEC 1.005 06/27/2014 1955   PHURINE 5.0 03/09/2017 1739  GLUCOSEU 50 (A) 03/09/2017 1739   GLUCOSEU Negative 06/27/2014 1955   HGBUR NEGATIVE 03/09/2017 1739   BILIRUBINUR NEGATIVE 03/09/2017 1739   BILIRUBINUR Negative 06/27/2014  1955   KETONESUR NEGATIVE 03/09/2017 1739   PROTEINUR NEGATIVE 03/09/2017 1739   UROBILINOGEN 0.2 09/06/2015 1515   NITRITE NEGATIVE 03/09/2017 1739   LEUKOCYTESUR NEGATIVE 03/09/2017 1739   LEUKOCYTESUR Negative 06/27/2014 1955    Radiological Exams on Admission: Ct Head Wo Contrast  Result Date: 03/09/2017 CLINICAL DATA:  Persistent dizziness beginning at 7:30 a.m. today. Resolved diplopia and LEFT arm weakness with mild residual LEFT arm numbness. History of diabetes, hypertension and hypercholesterolemia. EXAM: CT HEAD WITHOUT CONTRAST TECHNIQUE: Contiguous axial images were obtained from the base of the skull through the vertex without intravenous contrast. COMPARISON:  CT HEAD February 24, 2016 FINDINGS: BRAIN: No intraparenchymal hemorrhage, mass effect nor midline shift. The ventricles and sulci are normal for age. Patchy supratentorial white matter hypodensities less than expected for patient's age, though non-specific are most compatible with chronic small vessel ischemic disease. No acute large vascular territory infarcts. No abnormal extra-axial fluid collections. Basal cisterns are patent. VASCULAR: Mild to moderate calcific atherosclerosis of the carotid siphons. SKULL: No skull fracture. Moderate temporomandibular osteoarthrosis. No significant scalp soft tissue swelling. SINUSES/ORBITS: Mild paranasal sinus mucosal thickening. Mastoid air cells are well aerated. The included ocular globes and orbital contents are non-suspicious. OTHER: Patient is edentulous. IMPRESSION: Normal CT HEAD for age. Electronically Signed   By: Elon Alas M.D.   On: 03/09/2017 17:56    EKG: Independently reviewed. Sinus, RBBB, unchanged from prior  Assessment/Plan TIA (transient ischemic attack) vs. Stroke - Admit for work up - Apparently patient cannot have MRI due to incompatible pacemaker - Consider CTA for further evaluation - Check lipid panel, A1C - Telemetry - Bedside swallow screen -  PT/OT/SLP if needed - Permissive HTN (currently 412 Systolic), hold antihypertensives - TTE, carotid dopplers - Frequent neuro checks - Continue aspirin, consider changing to plavix based on neurology opinion. She reports taking aspirin regularly to me.   Type II diabetes mellitus  - Last A1C in our system from 2016 - 6.5 - Hold oral medications as awaiting swallow screening - SSI    Essential hypertension - BP on the low side tonight - Hold antihypertensives to allow permissive HTN for 24-48 hours - Hold lisinopril, metoprolol, tamsulosin  Aortic valve stenosis, critical S/P TAVR (transcatheter aortic valve replacement) - On a daily aspirin - No acute issues, some SOB with activity, but she has limited activity due to knee pain and debility  Complete heart block  s/p PCM placement - PC in place - Monitor on telemetry - Hold beta blocker  Chronic diastolic congestive heart failure  - She reports intermittent swelling - Last TTE in our system form 2017 shows grade 1 failure - Holding antihypertensives as above.     MDD (major depressive disorder)  - She is taking bupropion and paroxetine, these were continued - Trazodone for sleep   Rheumatoid arthritis/Fibromyalgia - Currently taking meloxicam daily, held in the acute setting, restart if needed - PRN tylenol for pain    Hypothyroidism - Check TSH - Continue synthroid    IBS (irritable bowel syndrome) - She is on daily colace, had diarrhea a couple of days ago and now with abdominal pain - AXR to start, evaluate for SBO or increased stool burden    DVT prophylaxis: Lovenox Code Status: Full, discussed with patient and family.  They have no living  will and have not thought much about this issue.  I advised for them to discuss with PCP In future.  Family Communication: Husband and daughter at bedside Disposition Plan: Likely discharge in 1-2 days after work up [employer] called: Neurology, [place] Admission status: Obs,  Telemetry   Gilles Chiquito MD [facility] Pager [phone]  If 7PM-7AM, please contact night-coverage [email or web address] Password [facility]  03/09/2017, 8:18 PM

## 2017-03-09 NOTE — Progress Notes (Signed)
Patient admitted from E.D via stretcher Patient alert and oriented  Welcomed to unit   Call light and room orientation given to patient and family. Tele initiated and verified.

## 2017-03-09 NOTE — ED Notes (Signed)
Admitting at bedside 

## 2017-03-09 NOTE — ED Triage Notes (Signed)
Per EMS: Pt with new onset dizziness and L arm numbness & weakness @ 0730 today. Pt complaining of some transient headache this am. Pt states no other neuro deficits. Pt a/o x 4. VSS

## 2017-03-09 NOTE — ED Provider Notes (Signed)
[facility] DEPT Provider Note   CSN: [id number] Arrival date & time: 03/09/17  1709     History   Chief Complaint Chief Complaint  Patient presents with  . Dizziness  . Extremity Weakness    HPI Catherine [name] is a 76 y.o. female with a past medical history significant for hypertension, hypercholesterolemia, diabetes, complete heart block status post pacemaker dependence, TAVR, CAD, thyroid disease, and prior breast cancer who presents with persistent dizziness, and resolved diplopia, left arm weakness, improving left arm numbness, and left-sided head and neck pain. EMS reports that patient has been off of Plavix for several months. Patient said that she was normal when she woke up at 7:30 AM but shortly after awaking, began having room spinning dizziness and double vision. She says that the dizziness has continued throughout the day but the double vision has resolved. She also says that she had left arm numbness and weakness. She reports that the left arm weakness lasted for several hours and has resolved but the numbness is mildly still present. She says that she is having a mild to moderate headache and neck pain on the left side. She denies any recent traumas. She says she has never had a history of a stroke. She denies any fevers, chills, chest pain, shortness of breath, palpitations. He does report some nausea that resolved with Zofran with EMS. She says that she has had some dysuria for the last few days. She denies any other symptoms on arrival.  The history is provided by the patient and medical records.  Neurologic Problem  This is a new problem. The current episode started 6 to 12 hours ago. The problem occurs constantly. The problem has been gradually improving. Associated symptoms include headaches. Pertinent negatives include no chest pain, no abdominal pain and no shortness of breath. Nothing aggravates the symptoms. Nothing relieves the symptoms. She has tried nothing for the  symptoms. The treatment provided no relief.    Past Medical History:  Diagnosis Date  . Acute colitis   . Anemia   . CAD (coronary artery disease)    a. heavy calcification of the entire LAD & mod eccentric mid LAD stenosis, estimated @ 70%, mild nonobs stenosis of RCA and LCx, severely calcified Ao valve with restricted valve mobility and 2+ AI    . Cancer [facility]) 1981   breast  . CHB (complete heart block) ([place]) 08/31/2015   [place] DR model TZ0017 (serial number [id number] )   . Chronic diastolic congestive heart failure ([place])    a. echo 08/31/2015: EF 65-70%, nl WM, GR1DD, Ao valve stent bioprosthesis was present and functioning nl, no regurg, LA mildly dilated, PASP 46 mm Hg  . Colon polyps   . Depression   . Diabetes mellitus type II   . Fibromyalgia   . HTN (hypertension)   . Hypercholesteremia   . Hypothyroidism   . IBS (irritable bowel syndrome)   . Rectal bleeding   . Rheumatoid arthritis(714.0)   . S/P TAVR (transcatheter aortic valve replacement) 08/30/2015   26 mm Edwards Sapien 3 transcatheter heart valve placed via open right transfemoral approach  . Shortness of breath dyspnea   . Stenosis of aortic valve   . Thyroid disease     Patient Active Problem List   Diagnosis Date Noted  . Suicidal ideation 03/06/2016  . Severe recurrent major depression without psychotic features ([place])   . Chest pain 09/24/2015  . Hypokalemia 09/07/2015  . IBS (irritable bowel  syndrome)   . Cancer ([place])   . Pneumothorax 09/05/2015  . Complete heart block ([place])   . S/P TAVR (transcatheter aortic valve replacement) 08/30/2015  . Type II diabetes mellitus ([place])   . Essential hypertension   . Chronic diastolic congestive heart failure ([place])   . Rheumatoid arthritis ([place])   . Hypothyroidism   . Fibromyalgia   . Aortic valve stenosis, critical 08/22/2015  . MDD (major depressive disorder) ([place]) 05/26/2012    Past Surgical History:  Procedure Laterality Date  .  ABDOMINAL SURGERY     Intestinal surgery  . CARDIAC SURGERY    . CESAREAN SECTION     x 2  . EP IMPLANTABLE DEVICE N/A 08/31/2015   Procedure: Pacemaker Implant;  Surgeon: Will Meredith Leeds, MD; [place] (serial number [id number] ); Laterality: Right  . LEFT OOPHORECTOMY    . MASTECTOMY Left   . polyp     . self inflicted chest wound    . [place]   lower back  . TEE WITHOUT CARDIOVERSION N/A 08/30/2015   Procedure: TRANSESOPHAGEAL ECHOCARDIOGRAM (TEE);  Surgeon: Sherren Mocha, MD;  Location: [facility];  Service: Open Heart Surgery;  Laterality: N/A;  . TOTAL ABDOMINAL HYSTERECTOMY    . TRANSCATHETER AORTIC VALVE REPLACEMENT, TRANSFEMORAL N/A 08/30/2015   Procedure: TRANSCATHETER AORTIC VALVE REPLACEMENT, TRANSFEMORAL;  Surgeon: Sherren Mocha, MD;  Location: [facility];  Service: Open Heart Surgery;  Laterality: N/A;    OB History    No data available       Home Medications    Prior to Admission medications   Medication Sig Start Date End Date Taking? Authorizing Provider  aspirin EC 81 MG tablet Take 81 mg by mouth daily.    Historical Provider, MD  atorvastatin (LIPITOR) 40 MG tablet Take 40 mg by mouth every evening.    Historical Provider, MD  buPROPion (WELLBUTRIN XL) 300 MG 24 hr tablet Take 1 tablet (300 mg total) by mouth daily. 12/11/16   Kathlee Nations, MD  clonazePAM (KLONOPIN) 1 MG tablet Take 1 tablet (1 mg total) by mouth at bedtime. 12/11/16   Kathlee Nations, MD  KLOR-CON M10 10 MEQ tablet TAKE 1 TABLET BY MOUTH EVERY DAY 10/01/16   Bhavinkumar Bhagat, PA  levothyroxine (SYNTHROID, LEVOTHROID) 100 MCG tablet Take 100 mcg by mouth daily before breakfast.     Historical Provider, MD  Linagliptin-Metformin HCl (JENTADUETO) 2.5-500 MG TABS Take 0.5 tablets by mouth daily with breakfast.     Historical Provider, MD  lisinopril (PRINIVIL,ZESTRIL) 10 MG tablet TAKE 1 TABLET (10 MG TOTAL) BY MOUTH DAILY. 12/05/16   Sherren Mocha, MD  metoprolol  tartrate (LOPRESSOR) 25 MG tablet Take 25 mg by mouth daily.     Historical Provider, MD  Multiple Vitamin (MULTIVITAMIN WITH MINERALS) TABS tablet Take 1 tablet by mouth daily at 12 noon.     Historical Provider, MD  pantoprazole (PROTONIX) 40 MG tablet TAKE 1 TABLET BY MOUTH EVERY DAY 11/27/16   Sherren Mocha, MD  PARoxetine (PAXIL) 30 MG tablet Take 1 tablet (30 mg total) by mouth daily. 12/11/16   Kathlee Nations, MD  tamsulosin (FLOMAX) 0.4 MG CAPS capsule Take 0.4 mg by mouth daily after breakfast.    Historical Provider, MD  traZODone (DESYREL) 50 MG tablet Take 1 tablet (50 mg total) by mouth at bedtime as needed for sleep. 12/11/16   Kathlee Nations, MD    Family History Family History  Problem Relation Age of Onset  . Depression Sister   . Depression Sister   . Depression Sister   . Hypertension Mother   . Lung cancer Sister   . Stroke Sister   . Hypertension Sister   . Heart attack Neg Hx     Social History Social History  Substance Use Topics  . Smoking status: Never Smoker  . Smokeless tobacco: Never Used  . Alcohol use No     Allergies   Sulfa antibiotics and Latex   Review of Systems Review of Systems  Constitutional: Negative for chills, diaphoresis, fatigue and fever.  HENT: Negative for congestion and rhinorrhea.   Eyes: Positive for visual disturbance (resolved diplopia).  Respiratory: Negative for chest tightness, shortness of breath, wheezing and stridor.   Cardiovascular: Negative for chest pain, palpitations and leg swelling.  Gastrointestinal: Positive for nausea (resolved). Negative for abdominal pain, diarrhea and vomiting.  Genitourinary: Positive for dysuria. Negative for difficulty urinating.  Musculoskeletal: Positive for neck pain. Negative for back pain and neck stiffness.  Skin: Negative for rash and wound.  Neurological: Positive for dizziness, weakness, numbness and headaches. Negative for seizures, speech difficulty and light-headedness.    Psychiatric/Behavioral: Negative for agitation and confusion.  All other systems reviewed and are negative.    Physical Exam Updated Vital Signs BP (!) 117/59 (BP Location: Left Arm)   Pulse 77   Temp 98.6 F (37 C) (Oral)   Resp 18   Ht 5\' 4"  (1.626 m)   Wt 196 lb (88.9 kg)   SpO2 98%   BMI 33.64 kg/m   Physical Exam  Constitutional: She is oriented to person, place, and time. She appears well-developed and well-nourished. No distress.  HENT:  Head: Normocephalic and atraumatic.  Right Ear: External ear normal.  Left Ear: External ear normal.  Nose: Nose normal.  Mouth/Throat: Oropharynx is clear and moist. No oropharyngeal exudate.  Eyes: Conjunctivae and EOM are normal. Pupils are equal, round, and reactive to light.  Neck: Normal range of motion. Neck supple.  Cardiovascular: Normal rate, regular rhythm and intact distal pulses.   Murmur heard. Pulmonary/Chest: Effort normal and breath sounds normal. No stridor. No respiratory distress. She has no wheezes. She has no rales. She exhibits no tenderness.  Abdominal: Soft. She exhibits no distension. There is no tenderness. There is no rebound.  Musculoskeletal: She exhibits no edema or tenderness.  Neurological: She is alert and oriented to person, place, and time. She is not disoriented. She displays no tremor. No cranial nerve deficit or sensory deficit. She exhibits normal muscle tone. Coordination normal. GCS eye subscore is 4. GCS verbal subscore is 5. GCS motor subscore is 6.  Skin: Skin is warm. Capillary refill takes less than 2 seconds. No rash noted. She is not diaphoretic. No erythema.  Psychiatric: She has a normal mood and affect.  Nursing note and vitals reviewed.    ED Treatments / Results  Labs (all labs ordered are listed, but only abnormal results are displayed) Labs Reviewed  CBC - Abnormal; Notable for the following:       Result Value   Hemoglobin 10.9 (*)    HCT 34.1 (*)    All other components  within normal limits  COMPREHENSIVE METABOLIC PANEL - Abnormal; Notable for the following:    Glucose, Bld 112 (*)    Albumin 3.3 (*)    ALT 10 (*)    Total Bilirubin 0.2 (*)    GFR calc non Af Amer 59 (*)  All other components within normal limits  URINALYSIS, ROUTINE W REFLEX MICROSCOPIC - Abnormal; Notable for the following:    Glucose, UA 50 (*)    All other components within normal limits  LIPID PANEL - Abnormal; Notable for the following:    LDL Cholesterol 114 (*)    All other components within normal limits  CBC - Abnormal; Notable for the following:    Hemoglobin 10.9 (*)    HCT 34.9 (*)    All other components within normal limits  BASIC METABOLIC PANEL - Abnormal; Notable for the following:    Potassium 3.4 (*)    Glucose, Bld 104 (*)    GFR calc non Af Amer 58 (*)    All other components within normal limits  TSH - Abnormal; Notable for the following:    TSH 7.993 (*)    All other components within normal limits  GLUCOSE, CAPILLARY - Abnormal; Notable for the following:    Glucose-Capillary 167 (*)    All other components within normal limits  GLUCOSE, CAPILLARY - Abnormal; Notable for the following:    Glucose-Capillary 151 (*)    All other components within normal limits  HEPATIC FUNCTION PANEL - Abnormal; Notable for the following:    Total Protein 6.3 (*)    Albumin 3.1 (*)    ALT 11 (*)    All other components within normal limits  GLUCOSE, CAPILLARY - Abnormal; Notable for the following:    Glucose-Capillary 120 (*)    All other components within normal limits  GLUCOSE, CAPILLARY - Abnormal; Notable for the following:    Glucose-Capillary 115 (*)    All other components within normal limits  GLUCOSE, CAPILLARY - Abnormal; Notable for the following:    Glucose-Capillary 102 (*)    All other components within normal limits  I-STAT CHEM 8, ED - Abnormal; Notable for the following:    Glucose, Bld 112 (*)    Calcium, Ion 1.10 (*)    Hemoglobin 10.9 (*)     HCT 32.0 (*)    All other components within normal limits  ETHANOL  PROTIME-INR  APTT  DIFFERENTIAL  RAPID URINE DRUG SCREEN, HOSP PERFORMED  MAGNESIUM  LIPASE, BLOOD  T4, FREE  HEMOGLOBIN A1C  COMPREHENSIVE METABOLIC PANEL  CBC  I-STAT TROPOININ, ED    EKG  EKG Interpretation  Date/Time:  Saturday March 09 2017 17:26:11 EDT Ventricular Rate:  70 PR Interval:    QRS Duration: 141 QT Interval:  408 QTC Calculation: 441 R Axis:   -53 Text Interpretation:  Sinus rhythm RBBB and LAFB When compared to prior, similar morphology but slowed rate.  No STEMI Confirmed by Sherry Ruffing MD, Keegan Bensch [phone]) on 03/09/2017 6:13:17 PM       Radiology Ct Angio Head W Or Wo Contrast  Result Date: 03/10/2017 CLINICAL DATA:  Initial evaluation for acute dizziness, diplopia, left arm weakness. EXAM: CT ANGIOGRAPHY HEAD AND NECK TECHNIQUE: Multidetector CT imaging of the head and neck was performed using the standard protocol during bolus administration of intravenous contrast. Multiplanar CT image reconstructions and MIPs were obtained to evaluate the vascular anatomy. Carotid stenosis measurements (when applicable) are obtained utilizing NASCET criteria, using the distal internal carotid diameter as the denominator. CONTRAST:  50 cc of Isovue 370. COMPARISON:  Prior CT from 03/09/2017. FINDINGS: CTA NECK FINDINGS Aortic arch: Visualized aortic arch of normal caliber. Scattered atheromatous plaque noted within the arch itself. No definite high-grade stenosis about the origin of the great vessels, although this is  incompletely visualized on this exam. Visualized subclavian arteries patent without hemodynamically significant stenosis. Right carotid system: Right common carotid artery patent from its origin to the bifurcation. The atheromatous plaque at the right carotid bifurcation/ proximal right ICA with associated short-segment stenosis of up to 40% by NASCET criteria. Right ICA widely patent distally  to the skullbase. Left carotid system: Left common carotid artery patent from its origin to the bifurcation. A centric calcified plaque about the left carotid bifurcation/proximal left ICA. Associated narrowing of approximately 35-40% by NASCET criteria. Left ICA widely patent distally to the skullbase. Vertebral arteries: Both of the vertebral arteries arise from the subclavian arteries. Mild to moderate smooth narrowing within the proximal V2 segments at the level of C5-6 related to uncovertebral disease. Vertebral arteries otherwise patent within the neck without stenosis, dissection, or occlusion. Skeleton: No acute osseus abnormality. No worrisome lytic or blastic osseous lesions. Moderate multilevel degenerative spondylolysis and facet arthrosis noted. Other neck: No acute soft tissue abnormality within the neck. No adenopathy. Thyroid normal. Upper chest: Visualized upper chest within normal limits. Partially visualized lungs are largely clear. Review of the MIP images confirms the above findings CTA HEAD FINDINGS Anterior circulation: The scattered plaque present within the horizontal petrous right ICA with mild narrowing. Petrous left ICA widely patent. Smooth atheromatous plaque within the cavernous/supraclinoid ICAs with moderate diffuse narrowing, worse on the right. ICA termini widely patent. Left A1 segment patent. Right A1 segment hypoplastic/absent, which accounts for the diminutive right ICA is compared to the left. Anterior communicating artery normal. Anterior cerebral arteries patent to their distal aspects. M1 segments patent without stenosis or occlusion proximal right M1 segment is duplicated/fenestrated. MCA bifurcations normal. No proximal M2 occlusion. Distal MCA branches well opacified and symmetric. Posterior circulation: Vertebral arteries patent to the vertebrobasilar junction. Left vertebral artery slightly dominant. Posterior inferior cerebral arteries patent. Basilar artery widely  patent to its distal aspect. Short fenestration of the proximal basilar artery noted. Superior cerebral arteries patent bilaterally. Both of the posterior cerebral arteries primarily supplied via the basilar artery. Bilateral severe P2 stenoses noted. PCAs are patent to their distal aspects. Venous sinuses: Patent. Anatomic variants: Fenestrated proximal right M1 segment and proximal basilar artery. Hypoplastic/absent right A1 segment with the ACA supplied via the left carotid artery system. Delayed phase: No pathologic enhancement. Review of the MIP images confirms the above findings IMPRESSION: CTA NECK IMPRESSION: 1. Atheromatous stenosis about the proximal ICAs bilaterally, with associated short-segment stenosis of up to 40% by NASCET criteria, right slightly worse than left. 2. Patent vertebral arteries within the neck. CTA HEAD IMPRESSION: 1. Negative CTA for large vessel occlusion. 2. Severe stenoses involving the P2 segments bilaterally. 3. Smooth atheromatous plaque throughout the carotid siphons with moderate diffuse narrowing, right worse than left. 4. Hypoplastic/absent right A1 segment, with the anterior cerebral arteries supplied via the left carotid artery system. Electronically Signed   By: Jeannine Boga M.D.   On: 03/10/2017 20:40   Dg Chest 2 View  Result Date: 03/09/2017 CLINICAL DATA:  Dizziness, vertigo, diplopia, left arm weakness and numbness. EXAM: CHEST  2 VIEW COMPARISON:  06/29/2016 CXR FINDINGS: Heart is top-normal in size. Aortic arch atherosclerosis. No aneurysm. Minimal atelectasis and/or scarring at the left lung base. No overt pulmonary edema, pneumothorax nor effusion. Status post aortic valvular replacement. Right atrial and right ventricular leads are noted with right-sided pacemaker apparatus in place. AC and glenohumeral joint osteoarthritis is identified bilaterally. Chronic left lower rib fractures. IMPRESSION: No active cardiopulmonary  disease. Minimal left basilar  atelectasis and/or scarring. Aortic atherosclerosis. Right-sided pacemaker apparatus in place with right atrial and right ventricular leads. Electronically Signed   By: Ashley Royalty M.D.   On: 03/09/2017 23:26   Dg Abd 1 View  Result Date: 03/09/2017 CLINICAL DATA:  Left-sided abdominal pain with diarrhea 4 days ago. EXAM: ABDOMEN - 1 VIEW COMPARISON:  None. FINDINGS: The bowel gas pattern is normal. Degenerate changes are seen along the dorsal spine. Partially visualized aortic valvular replacement and right ventricular lead from a pacemaker apparatus. No radio-opaque calculi or other significant radiographic abnormality are seen. 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 Vascular clips project over the right hip. IMPRESSION: Unremarkable bowel gas pattern. Degenerative changes are noted along the dorsal spine, pubic symphysis and both hip joints. Electronically Signed   By: Ashley Royalty M.D.   On: 03/09/2017 23:27   Ct Head Wo Contrast  Result Date: 03/09/2017 CLINICAL DATA:  Persistent dizziness beginning at 7:30 a.m. today. Resolved diplopia and LEFT arm weakness with mild residual LEFT arm numbness. History of diabetes, hypertension and hypercholesterolemia. EXAM: CT HEAD WITHOUT CONTRAST TECHNIQUE: Contiguous axial images were obtained from the base of the skull through the vertex without intravenous contrast. COMPARISON:  CT HEAD February 24, 2016 FINDINGS: BRAIN: No intraparenchymal hemorrhage, mass effect nor midline shift. The ventricles and sulci are normal for age. Patchy supratentorial white matter hypodensities less than expected for patient's age, though non-specific are most compatible with chronic small vessel ischemic disease. No acute large vascular territory infarcts. No abnormal extra-axial fluid collections. Basal cisterns are patent. VASCULAR: Mild to moderate calcific atherosclerosis of the carotid siphons. SKULL: No skull fracture. Moderate temporomandibular osteoarthrosis. No  significant scalp soft tissue swelling. SINUSES/ORBITS: Mild paranasal sinus mucosal thickening. Mastoid air cells are well aerated. The included ocular globes and orbital contents are non-suspicious. OTHER: Patient is edentulous. IMPRESSION: Normal CT HEAD for age. Electronically Signed   By: Elon Alas M.D.   On: 03/09/2017 17:56   Ct Angio Neck W Or Wo Contrast  Result Date: 03/10/2017 CLINICAL DATA:  Initial evaluation for acute dizziness, diplopia, left arm weakness. EXAM: CT ANGIOGRAPHY HEAD AND NECK TECHNIQUE: Multidetector CT imaging of the head and neck was performed using the standard protocol during bolus administration of intravenous contrast. Multiplanar CT image reconstructions and MIPs were obtained to evaluate the vascular anatomy. Carotid stenosis measurements (when applicable) are obtained utilizing NASCET criteria, using the distal internal carotid diameter as the denominator. CONTRAST:  50 cc of Isovue 370. COMPARISON:  Prior CT from 03/09/2017. FINDINGS: CTA NECK FINDINGS Aortic arch: Visualized aortic arch of normal caliber. Scattered atheromatous plaque noted within the arch itself. No definite high-grade stenosis about the origin of the great vessels, although this is incompletely visualized on this exam. Visualized subclavian arteries patent without hemodynamically significant stenosis. Right carotid system: Right common carotid artery patent from its origin to the bifurcation. The atheromatous plaque at the right carotid bifurcation/ proximal right ICA with associated short-segment stenosis of up to 40% by NASCET criteria. Right ICA widely patent distally to the skullbase. Left carotid system: Left common carotid artery patent from its origin to the bifurcation. A centric calcified plaque about the left carotid bifurcation/proximal left ICA. Associated narrowing of approximately 35-40% by NASCET criteria. Left ICA widely patent distally to the skullbase. Vertebral arteries: Both  of the vertebral arteries arise from the subclavian arteries. Mild to moderate smooth narrowing within the proximal V2 segments at the level of C5-6  related to uncovertebral disease. Vertebral arteries otherwise patent within the neck without stenosis, dissection, or occlusion. Skeleton: No acute osseus abnormality. No worrisome lytic or blastic osseous lesions. Moderate multilevel degenerative spondylolysis and facet arthrosis noted. Other neck: No acute soft tissue abnormality within the neck. No adenopathy. Thyroid normal. Upper chest: Visualized upper chest within normal limits. Partially visualized lungs are largely clear. Review of the MIP images confirms the above findings CTA HEAD FINDINGS Anterior circulation: The scattered plaque present within the horizontal petrous right ICA with mild narrowing. Petrous left ICA widely patent. Smooth atheromatous plaque within the cavernous/supraclinoid ICAs with moderate diffuse narrowing, worse on the right. ICA termini widely patent. Left A1 segment patent. Right A1 segment hypoplastic/absent, which accounts for the diminutive right ICA is compared to the left. Anterior communicating artery normal. Anterior cerebral arteries patent to their distal aspects. M1 segments patent without stenosis or occlusion proximal right M1 segment is duplicated/fenestrated. MCA bifurcations normal. No proximal M2 occlusion. Distal MCA branches well opacified and symmetric. Posterior circulation: Vertebral arteries patent to the vertebrobasilar junction. Left vertebral artery slightly dominant. Posterior inferior cerebral arteries patent. Basilar artery widely patent to its distal aspect. Short fenestration of the proximal basilar artery noted. Superior cerebral arteries patent bilaterally. Both of the posterior cerebral arteries primarily supplied via the basilar artery. Bilateral severe P2 stenoses noted. PCAs are patent to their distal aspects. Venous sinuses: Patent. Anatomic  variants: Fenestrated proximal right M1 segment and proximal basilar artery. Hypoplastic/absent right A1 segment with the ACA supplied via the left carotid artery system. Delayed phase: No pathologic enhancement. Review of the MIP images confirms the above findings IMPRESSION: CTA NECK IMPRESSION: 1. Atheromatous stenosis about the proximal ICAs bilaterally, with associated short-segment stenosis of up to 40% by NASCET criteria, right slightly worse than left. 2. Patent vertebral arteries within the neck. CTA HEAD IMPRESSION: 1. Negative CTA for large vessel occlusion. 2. Severe stenoses involving the P2 segments bilaterally. 3. Smooth atheromatous plaque throughout the carotid siphons with moderate diffuse narrowing, right worse than left. 4. Hypoplastic/absent right A1 segment, with the anterior cerebral arteries supplied via the left carotid artery system. Electronically Signed   By: Jeannine Boga M.D.   On: 03/10/2017 20:40    Procedures Procedures (including critical care time)  Medications Ordered in ED Medications  docusate sodium (COLACE) capsule 100 mg (100 mg Oral Given 03/10/17 2138)  buPROPion (WELLBUTRIN XL) 24 hr tablet 300 mg (300 mg Oral Given 03/10/17 0927)  clonazePAM (KLONOPIN) tablet 1 mg (1 mg Oral Given 03/10/17 2138)  PARoxetine (PAXIL) tablet 30 mg (30 mg Oral Given 03/10/17 0928)  traZODone (DESYREL) tablet 50 mg (50 mg Oral Given 03/10/17 2143)  pantoprazole (PROTONIX) EC tablet 40 mg (40 mg Oral Given 03/10/17 0927)  multivitamin with minerals tablet 1 tablet (0 tablets Oral Duplicate 01/10/77 6767)  levothyroxine (SYNTHROID, LEVOTHROID) tablet 100 mcg (100 mcg Oral Given 03/10/17 0928)  0.9 %  sodium chloride infusion ( Intravenous New Bag/Given 03/09/17 2259)  acetaminophen (TYLENOL) tablet 650 mg (650 mg Oral Given 03/10/17 1446)    Or  acetaminophen (TYLENOL) solution 650 mg ( Per Tube See Alternative 03/10/17 1446)    Or  acetaminophen (TYLENOL) suppository 650 mg ( Rectal See  Alternative 03/10/17 1446)  senna-docusate (Senokot-S) tablet 1 tablet (not administered)  enoxaparin (LOVENOX) injection 40 mg (40 mg Subcutaneous Given 03/10/17 2136)  insulin aspart (novoLOG) injection 0-15 Units (0 Units Subcutaneous Not Given 03/10/17 1649)  insulin aspart (novoLOG) injection 0-5 Units (0  Units Subcutaneous Not Given 03/09/17 2318)  clopidogrel (PLAVIX) tablet 75 mg (75 mg Oral Given 03/10/17 0927)  atorvastatin (LIPITOR) tablet 80 mg (80 mg Oral Given 03/10/17 1722)  aspirin tablet 325 mg (325 mg Oral Not Given 03/10/17 1722)   stroke: mapping our early stages of recovery book ( Does not apply Given 03/09/17 2241)  potassium chloride SA (K-DUR,KLOR-CON) CR tablet 40 mEq (40 mEq Oral Given 03/10/17 0927)  iopamidol (ISOVUE-370) 76 % injection (50 mLs  Contrast Given 03/10/17 1610)     Initial Impression / Assessment and Plan / ED Course  I have reviewed the triage vital signs and the nursing notes.  Pertinent labs & imaging results that were available during my care of the patient were reviewed by me and considered in my medical decision making (see chart for details).     Catherine [name] is a 76 y.o. female with a past medical history significant for hypertension, hypercholesterolemia, diabetes, complete heart block status post pacemaker dependence, TAVR, CAD, thyroid disease, and prior breast cancer who presents with persistent dizziness, and resolved diplopia, left arm weakness, improving left arm numbness, and left-sided head and neck pain.   History and exam are seen above.  On exam, patient has no diplopia. Patient has no blurry vision. Patient has symmetric grip strength bilaterally. No lower extremity focal neurologic deficits. No sensation differences in arms or legs. Normal finger-nose-finger coordination. Lungs clear. Abdomen nontender. Chest nontender. No tenderness on neck and head exam. Normal extraocular movements and pupils.  Given patient's symptoms, suspect stroke versus  TIA. Given the dysuria, UTI also considered. This will be checked. Initial CT ordered for evaluation.  Patient reports her last normal was 7:30 AM, approximately 10 hours prior to arrival.  Diagnostic testing results showed no evidence of UTI. CT head normal.  Patient is unable to get MRI due to hardware.  Neurology called for possible TIA versus stroke. They recommended Admission to hospitalist service for further workup. Patient admitted in stable condition.   Final Clinical Impressions(s) / ED Diagnoses   Final diagnoses:  Dizziness  Weakness  Diplopia  Numbness     Clinical Impression: 1. Dizziness   2. Weakness   3. Diplopia   4. Numbness   5. TIA (transient ischemic attack)   6. Abdominal pain   7. CVA (cerebral vascular accident) [facility])     Disposition: Admit to Hospitalist service    Courtney Paris, MD 03/11/17 [phone]

## 2017-03-10 ENCOUNTER — Observation Stay (HOSPITAL_COMMUNITY): Payer: Medicare Other

## 2017-03-10 DIAGNOSIS — I5032 Chronic diastolic (congestive) heart failure: Secondary | ICD-10-CM

## 2017-03-10 DIAGNOSIS — I639 Cerebral infarction, unspecified: Secondary | ICD-10-CM

## 2017-03-10 DIAGNOSIS — H8113 Benign paroxysmal vertigo, bilateral: Secondary | ICD-10-CM | POA: Diagnosis not present

## 2017-03-10 LAB — MAGNESIUM: Magnesium: 1.9 mg/dL (ref 1.7–2.4)

## 2017-03-10 LAB — LIPID PANEL
Cholesterol: 186 mg/dL (ref 0–200)
HDL: 43 mg/dL (ref >40)
LDL CALC: 114 mg/dL — AB (ref 0–99)
TRIGLYCERIDES: 147 mg/dL (ref <150)
Total CHOL/HDL Ratio: 4.3 RATIO
VLDL: 29 mg/dL (ref 0–40)

## 2017-03-10 LAB — BASIC METABOLIC PANEL
Anion gap: 8 (ref 5–15)
BUN: 10 mg/dL (ref 6–20)
CALCIUM: 9 mg/dL (ref 8.9–10.3)
CHLORIDE: 103 mmol/L (ref 101–111)
CO2: 28 mmol/L (ref 22–32)
CREATININE: 0.93 mg/dL (ref 0.44–1.00)
GFR, EST NON AFRICAN AMERICAN: 58 mL/min — AB (ref >60)
Glucose, Bld: 104 mg/dL — ABNORMAL HIGH (ref 65–99)
Potassium: 3.4 mmol/L — ABNORMAL LOW (ref 3.5–5.1)
SODIUM: 139 mmol/L (ref 135–145)

## 2017-03-10 LAB — HEPATIC FUNCTION PANEL
ALK PHOS: 60 U/L (ref 38–126)
ALT: 11 U/L — ABNORMAL LOW (ref 14–54)
AST: 21 U/L (ref 15–41)
Albumin: 3.1 g/dL — ABNORMAL LOW (ref 3.5–5.0)
BILIRUBIN DIRECT: 0.1 mg/dL (ref 0.1–0.5)
BILIRUBIN TOTAL: 0.6 mg/dL (ref 0.3–1.2)
Indirect Bilirubin: 0.5 mg/dL (ref 0.3–0.9)
Total Protein: 6.3 g/dL — ABNORMAL LOW (ref 6.5–8.1)

## 2017-03-10 LAB — TSH: TSH: 7.993 u[IU]/mL — AB (ref 0.350–4.500)

## 2017-03-10 LAB — T4, FREE: Free T4: 0.95 ng/dL (ref 0.61–1.12)

## 2017-03-10 LAB — GLUCOSE, CAPILLARY
GLUCOSE-CAPILLARY: 102 mg/dL — AB (ref 65–99)
Glucose-Capillary: 151 mg/dL — ABNORMAL HIGH (ref 65–99)
Glucose-Capillary: 120 mg/dL — ABNORMAL HIGH (ref 65–99)
Glucose-Capillary: 115 mg/dL — ABNORMAL HIGH (ref 65–99)

## 2017-03-10 LAB — CBC
HCT: 34.9 % — ABNORMAL LOW (ref 36.0–46.0)
HEMOGLOBIN: 10.9 g/dL — AB (ref 12.0–15.0)
MCH: 26.9 pg (ref 26.0–34.0)
MCHC: 31.2 g/dL (ref 30.0–36.0)
MCV: 86.2 fL (ref 78.0–100.0)
PLATELETS: 199 10*3/uL (ref 150–400)
RBC: 4.05 MIL/uL (ref 3.87–5.11)
RDW: 14.3 % (ref 11.5–15.5)
WBC: 8 10*3/uL (ref 4.0–10.5)

## 2017-03-10 LAB — LIPASE, BLOOD: Lipase: 18 U/L (ref 11–51)

## 2017-03-10 MED ORDER — ATORVASTATIN CALCIUM 80 MG PO TABS
80.0000 mg | ORAL_TABLET | Freq: Every evening | ORAL | Status: DC
  Administered 2017-03-10 – 2017-03-11 (×2): 80 mg via ORAL
  Filled 2017-03-10 (×2): qty 1

## 2017-03-10 MED ORDER — CLOPIDOGREL BISULFATE 75 MG PO TABS
75.0000 mg | ORAL_TABLET | Freq: Every day | ORAL | Status: DC
  Administered 2017-03-10 – 2017-03-12 (×3): 75 mg via ORAL
  Filled 2017-03-10 (×3): qty 1

## 2017-03-10 MED ORDER — ASPIRIN 325 MG PO TABS
325.0000 mg | ORAL_TABLET | Freq: Every day | ORAL | Status: DC
  Administered 2017-03-11 – 2017-03-12 (×2): 325 mg via ORAL
  Filled 2017-03-10 (×2): qty 1

## 2017-03-10 MED ORDER — IOPAMIDOL (ISOVUE-370) INJECTION 76%
INTRAVENOUS | Status: AC
  Administered 2017-03-10: 50 mL
  Filled 2017-03-10: qty 50

## 2017-03-10 MED ORDER — POTASSIUM CHLORIDE CRYS ER 20 MEQ PO TBCR
40.0000 meq | EXTENDED_RELEASE_TABLET | Freq: Once | ORAL | Status: AC
  Administered 2017-03-10: 40 meq via ORAL
  Filled 2017-03-10: qty 2

## 2017-03-10 NOTE — Evaluation (Signed)
Occupational Therapy Evaluation Patient Details Name: Catherine [name] MRN: [id number] DOB: 1941/01/19 Today's Date: 03/10/2017    History of Present Illness 76 y.o.femalewith medical history significant of Hypothyroidism, AS s/p TAVR, RA/FM, HTN, DM2, dCHF, CAD, h/o breast cancer in 1980 who presents with dizziness when looking left and L arm numbness. Pt admit for possible TIA. Pas surgical h/o pacemaker implant (2016).    Clinical Impression   PTA Pt was mod A for ADL and mod I with use of RW/SPC. Pt currently with decreased independence in ADL and mobility. Pt currently max A for ADL and mod +2 for sit <> stand this session. Pt with better sitting balance this session than with PT earlier today - able to attempt LB dressing and sit for vision assessment with no LOB (BUE supported) Please see ADL section below. Pt will benefit from skilled OT in the acute setting to maximize safety and independence in ADL. Pt will have 24 hour assist upon dc from husband and daughter.    Follow Up Recommendations  Supervision/Assistance - 24 hour;No OT follow up    Equipment Recommendations  None recommended by OT (Pt has appropriate DME)    Recommendations for Other Services       Precautions / Restrictions Precautions Precautions: Fall Restrictions Weight Bearing Restrictions: No      Mobility Bed Mobility Overal bed mobility: Needs Assistance Bed Mobility: Sit to Supine       Sit to supine: Mod assist   General bed mobility comments: Pt required mod A to bring BLE into bed  Transfers Overall transfer level: Needs assistance Equipment used: 2 person hand held assist Transfers: Sit to/from Stand Sit to Stand: Mod assist;+2 physical assistance         General transfer comment: physical assist to power up. repeated simple - yet explicit verbal cues for visual fixation on steady object throughout transition.    Balance Overall balance assessment: Needs assistance Sitting-balance  support: Feet supported;Bilateral upper extremity supported Sitting balance-Leahy Scale: Fair (approaching good) Sitting balance - Comments: Pt sitting EOB when OT entered, and maintained for visual assessment   Standing balance support: Bilateral upper extremity supported Standing balance-Leahy Scale: Poor Standing balance comment: reliant on physical assist                            ADL either performed or assessed with clinical judgement   ADL Overall ADL's : Needs assistance/impaired Eating/Feeding: Set up;Sitting Eating/Feeding Details (indicate cue type and reason): eating sitting at EOB when OT entered room Grooming: Set up;Sitting;Wash/dry face;Brushing hair Grooming Details (indicate cue type and reason): increased dizziness with turns of the head during hair brushing Upper Body Bathing: Moderate assistance;With caregiver independent assisting;Sitting Upper Body Bathing Details (indicate cue type and reason): Pt has help from husband/daughter at baseline Lower Body Bathing: Moderate assistance;With caregiver independent assisting;Sitting/lateral leans Lower Body Bathing Details (indicate cue type and reason): Pt requires mod assist at baseline from husband/daughter Upper Body Dressing : Set up;Sitting   Lower Body Dressing: With caregiver independent assisting;Sitting/lateral leans;Total assistance Lower Body Dressing Details (indicate cue type and reason): at baseline Pt required assist with shoes/socks.  Toilet Transfer: Moderate assistance;+2 for physical assistance;+2 for safety/equipment;Stand-pivot;BSC   Toileting- Clothing Manipulation and Hygiene: Total assistance;Sit to/from stand   Tub/ Banker: Moderate assistance;+2 for physical assistance;+2 for safety/equipment;Ambulation;Shower seat;Rolling walker   Functional mobility during ADLs: Moderate assistance;+2 for physical assistance;Cueing for sequencing;Cueing for safety  Vision Baseline  Vision/History: Wears glasses Wears Glasses: Reading only Patient Visual Report: No change from baseline Vision Assessment?: Yes Eye Alignment: Within Functional Limits Ocular Range of Motion: Within Functional Limits Alignment/Gaze Preference: Within Defined Limits Tracking/Visual Pursuits: Decreased smoothness of horizontal tracking Convergence: Within functional limits Diplopia Assessment: Other (comment) (denies ) Additional Comments: Pt with nystagmus that fades     Perception     Praxis      Pertinent Vitals/Pain Pain Assessment: No/denies pain     Hand Dominance Right   Extremity/Trunk Assessment Upper Extremity Assessment Upper Extremity Assessment: Generalized weakness (Pt states there is still some decreased sensation LUE)   Lower Extremity Assessment Lower Extremity Assessment: Generalized weakness;Defer to PT evaluation   Cervical / Trunk Assessment Cervical / Trunk Assessment: Kyphotic (limited cervical ROM)   Communication Communication Communication: No difficulties   Cognition Arousal/Alertness: Awake/alert Behavior During Therapy: WFL for tasks assessed/performed Overall Cognitive Status: Impaired/Different from baseline Area of Impairment: Memory;Following commands;Safety/judgement;Awareness;Problem solving                     Memory: Decreased recall of precautions;Decreased short-term memory Following Commands: Follows one step commands inconsistently;Follows one step commands with increased time Safety/Judgement: Decreased awareness of safety Awareness: Emergent Problem Solving: Slow processing;Difficulty sequencing;Requires verbal cues;Requires tactile cues General Comments: per daughter, decrease short term memory present PTA. pt requires multimodal cues for sequencing for sit <> stand   General Comments  Pt continues to report complaints of dizziness with position changes (i.e. turning over in bed, sitting up, lying down, etc.)     Exercises     Shoulder Instructions      Home Living Family/patient expects to be discharged to:: Private residence Living Arrangements: Spouse/significant other;Children Available Help at Discharge: Family;Available 24 hours/day Type of Home: House Home Access: Stairs to enter [employer] of Steps: 6 Entrance Stairs-Rails: Right;Left (cannot reach both simultaneously) Home Layout: Two level;Able to live on main level with bedroom/bathroom Alternate Level Stairs-Number of Steps: flight   Bathroom Shower/Tub: Occupational psychologist: Standard Bathroom Accessibility: Yes How Accessible: Accessible via wheelchair;Accessible via walker Home Equipment: [place] - 2 wheels;Cane - single point;Grab bars - toilet;Grab bars - tub/shower;Toilet riser;Shower seat - built in;Hand held shower head   Additional Comments: husband and daugther can provide 24/7 assist upon d/c      Prior Functioning/Environment Level of Independence: Independent with assistive device(s)        Comments: mod-I with use of SPC with intermittent use of RW        OT Problem List: Decreased range of motion;Decreased activity tolerance;Impaired balance (sitting and/or standing);Decreased coordination;Decreased safety awareness      OT Treatment/Interventions: Self-care/ADL training;DME and/or AE instruction;Energy conservation;Therapeutic activities;Balance training;Patient/family education    OT Goals(Current goals can be found in the care plan section) Acute Rehab OT Goals Patient Stated Goal: return home OT Goal Formulation: With patient/family Time For Goal Achievement: 03/24/17 Potential to Achieve Goals: Good ADL Goals Pt Will Perform Grooming: with supervision;standing Pt Will Transfer to Toilet: with supervision;ambulating (with caregiver independent in assisting; with RW) Pt Will Perform Toileting - Clothing Manipulation and hygiene: with supervision;with caregiver independent in  assisting;sit to/from stand Additional ADL Goal #1: Pt will implement one strategy (i.e. gaze fixation) as way to be more independent in ADL with no more than one verbal cue.  OT Frequency: Min 2X/week   Barriers to D/C:            Co-evaluation  End of Session Equipment Utilized During Treatment: Gait belt Nurse Communication: Mobility status  Activity Tolerance: Patient tolerated treatment well Patient left: in bed;with bed alarm set;with call bell/phone within reach;with family/visitor present  OT Visit Diagnosis: Unsteadiness on feet (R26.81);Muscle weakness (generalized) (M62.81);Dizziness and giddiness (R42) (PT reports positive BPPV from their evaluation)                Time: 1210-1232 OT Time Calculation (min): 22 min Charges:  OT General Charges $OT Visit: 1 Procedure OT Evaluation $OT Eval Moderate Complexity: 1 Procedure G-Codes: OT G-codes **NOT FOR INPATIENT CLASS** Functional Assessment Tool Used: AM-PAC 6 Clicks Daily Activity Functional Limitation: Self care Self Care Current Status (Z1245): At least 40 percent but less than 60 percent impaired, limited or restricted Self Care Goal Status (Y0998): At least 20 percent but less than 40 percent impaired, limited or restricted   Hulda Humphrey OTR/L [place] 03/10/2017, 2:00 PM

## 2017-03-10 NOTE — Progress Notes (Signed)
STROKE TEAM PROGRESS NOTE   HISTORY OF PRESENT ILLNESS (per record) Catherine [name] is an 76 y.o. female who presented to the ED with new onset of vertigo, diplopia, left arm weakness and left arm numbness shortly after waking up at 7:30 AM on Saturday. She also experienced transient left sided headache and neck pain. Nausea at time of EMS transport was treated with Zofran. At the time of her initial ED evaluation, her diplopia had resolved. At the time of Neurology evaluation, her vertigo and LUE weakness have also resolved, but subjective LUE numbness is still present.    Her PMHx includes HTN, hypercholesterolemia, DM, CAD, chronic diastolic CHF, complete heart block, cardiac pacemaker, TAVR, prior breast cancer, hypothyrodism, fibromyalgia and RA. She has no prior history of stroke.   She has been off Plavix for several months. Home medications include ASA and Lipitor.   LSN: [id number] tPA Given: No: Out of tPA time window and symptoms almost completely resolved.   SUBJECTIVE (INTERVAL HISTORY) Her daughter is at the bedside.  Pt notes that she is feeling better but still has the L face and LUE paresthesias with mild dizziness and headache. She endorses that when the symptoms started she was vertiginous but also light headed and had some diaphoresis and shortness of breath at the same time she had mild sensation changes of the L face and arm as well as L headache. She notes she is taking ASA 81mg  daily at home and stopped Plavix about 6 months ago. She does not have any chest pain, shortness of breath or bowel and bladder symptoms today.   OBJECTIVE Temp:  [97.8 F (36.6 C)-98.6 F (37 C)] 98.6 F (37 C) (04/08 0923) Pulse Rate:  [61-79] 79 (04/08 0923) Cardiac Rhythm: Normal sinus rhythm;Bundle branch block (04/08 0700) Resp:  [15-20] 16 (04/08 0923) BP: (100-128)/(46-57) 100/49 (04/08 0923) SpO2:  [96 %-100 %] 97 % (04/08 0923) Weight:  [88.9 kg (196 lb)] 88.9 kg (196 lb) (04/07  2100)  CBC:   Recent Labs Lab 03/09/17 1724 03/09/17 1850 03/10/17 0424  WBC 8.5  --  8.0  NEUTROABS 5.5  --   --   HGB 10.9* 10.9* 10.9*  HCT 34.1* 32.0* 34.9*  MCV 85.7  --  86.2  PLT 236  --  644    Basic Metabolic Panel:   Recent Labs Lab 03/09/17 1724 03/09/17 1850 03/10/17 0424 03/10/17 1007  NA 138 139 139  --   K 3.8 3.8 3.4*  --   CL 103 101 103  --   CO2 27  --  28  --   GLUCOSE 112* 112* 104*  --   BUN 13 16 10   --   CREATININE 0.91 1.00 0.93  --   CALCIUM 9.1  --  9.0  --   MG  --   --   --  1.9    Lipid Panel:     Component Value Date/Time   CHOL 186 03/10/2017 0424   TRIG 147 03/10/2017 0424   HDL 43 03/10/2017 0424   CHOLHDL 4.3 03/10/2017 0424   VLDL 29 03/10/2017 0424   LDLCALC 114 (H) 03/10/2017 0424   HgbA1c:  Lab Results  Component Value Date   HGBA1C 6.5 (H) 08/25/2015   Urine Drug Screen:     Component Value Date/Time   LABOPIA NONE DETECTED 03/09/2017 1724   COCAINSCRNUR NONE DETECTED 03/09/2017 1724   COCAINSCRNUR NONE DETECTED 03/06/2016 1942   LABBENZ NONE DETECTED 03/09/2017 1724  AMPHETMU NONE DETECTED 03/09/2017 1724   THCU NONE DETECTED 03/09/2017 1724   LABBARB NONE DETECTED 03/09/2017 1724     IMAGING  Dg Chest 2 View 03/09/2017 No active cardiopulmonary disease. Minimal left basilar atelectasis and/or scarring. Aortic atherosclerosis. Right-sided pacemaker apparatus in place with right atrial and right ventricular leads.   Dg Abd 1 View 03/09/2017 Unremarkable bowel gas pattern. Degenerative changes are noted along the dorsal spine, pubic symphysis and both hip joints.    Ct Head Wo Contrast (reviewed personally) 03/09/2017 Normal CT HEAD for age.   CT Angio Head and Neck - pending  PHYSICAL EXAM Physical exam  Temp:  [97.8 F (36.6 C)-98.6 F (37 C)] 98.6 F (37 C) (04/08 0923) Pulse Rate:  [61-79] 79 (04/08 0923) Resp:  [15-20] 16 (04/08 0923) BP: (100-128)/(46-57) 100/49 (04/08 0923) SpO2:  [96  %-100 %] 97 % (04/08 0923) Weight:  [88.9 kg (196 lb)] 88.9 kg (196 lb) (04/07 2100)  General - Well nourished, well developed, in no apparent distress.  Cardiovascular - Regular rate and rhythm.  Mental Status -  Level of arousal and orientation to time, place, and person were intact. Language including expression, naming, repetition, comprehension was assessed and found intact. Attention span and concentration were normal. Recent and remote memory were intact. Fund of Knowledge was assessed and was intact.  Cranial Nerves II - XII - II - Visual field intact OU. III, IV, VI - Extraocular movements intact. V - Feels like L V1-V3 are "heavy" VII - Facial movement intact bilaterally. VIII - Hearing & vestibular intact bilaterally. X - Palate elevates symmetrically. XI - Chin turning & shoulder shrug intact bilaterally. XII - Tongue protrusion intact.  Motor Strength - The patient's strength was normal in all extremities and pronator drift was absent.  Bulk was normal and fasciculations were absent.    Motor Tone - Muscle tone was assessed at the neck and appendages and was normal.  Reflexes - The patient's reflexes were 1+ in all extremities and she had no pathological reflexes.  Sensory - Light touch in tact throughout, does feel like her L side is "heavy" to light touch and temperature arm > leg.   Coordination - The patient had normal movements in the hands and feet with no ataxia or dysmetria.  Mild intermittent resting tremor noted of RUE.   Gait and Station - deferred   ASSESSMENT/PLAN [name] is a 76 y.o. female with history of hypothyroidism, transcatheter aortic valve replacement, rheumatoid arthritis, history of rectal bleeding, hyperlipidemia, hypertension, diabetes mellitus, depression, congestive heart failure, complete heart block status post pacemaker, history of breast cancer, and history of coronary artery disease presenting with vertigo, diplopia, left  sided numbness, left-sided headache, neck pain, and nausea. She did not receive IV t-PA due to Late presentation and improvement in deficits.  Suspected small vessel thalamic stroke vs. Brain stem stroke.   Resultant: L face and LUE paresthesias.   MRI - PPM  MRA - PPM  Carotid Doppler - CTA neck (pending)   2D Echo - pending  LDL - 114  HgbA1c - pending  VTE prophylaxis - Lovenox Diet heart healthy/carb modified Room service appropriate? Yes; Fluid consistency: Thin  aspirin 81 mg daily prior to admission, now on aspirin 81 mg daily and clopidogrel 75 mg daily  Patient counseled to be compliant with her antithrombotic medications  Ongoing aggressive stroke risk factor management  Therapy recommendations: Outpatient PT recommended. OT evaluation is pending  Disposition:  Pending  Hypertension  Stable  Permissive hypertension (OK if < 220/120) but gradually normalize in 5-7 days  Long-term BP goal normotensive  Hyperlipidemia  Home meds:  Lipitor 40 mg daily prior to admission resumed in hospital  LDL 114, goal < 70  Increase Lipitor to 80 mg daily  Continue statin at discharge  Diabetes  HgbA1c pending, goal < 7.0  Controlled  Other Stroke Risk Factors  Advanced age  Obesity, Body mass index is 33.64 kg/m., recommend weight loss, diet and exercise as appropriate   Family hx stroke (sister)  Coronary artery disease  Other Active Problems  Anemia - 10.9 ; 34.9  Mild hypokalemia - 3.4  Hospital day # 0   I have personally examined this patient, reviewed notes, independently viewed imaging studies, participated in medical decision making and plan of care.ROS completed by me personally and pertinent positives fully documented  I have made any additions or clarifications directly to the above note.  75 Y.O. With multiple stroke risk factors who is having fixed deficits of dizziness/paresthesias, diplopia has resolved. Given that symptoms are  persisting less likely that this represents a TIA. There is concern for a thalamic > brain stem ischemic stroke. Also possible that she had a dissection, neck vessel imaging pending. Given headache this could also represent a sequale of this. The majority of the stroke work up has not been done. Agree with TTE to start, will consider TEE after results from this. Vessel imaging is pending. Would continue ASA and Plavix for now, will recommend increasing Atorvastatin to 80mg  daily given her LDL >100 on 40. Will also benefit from prolonged heart monitoring as an outpt and neurology follow up as an oupt. We will continue to follow as this work up progresses.   Loney Hering, D.O.  [facility]  To contact Stroke Continuity provider, please refer to [email or web address] After hours, contact General Neurology

## 2017-03-10 NOTE — Evaluation (Signed)
Physical Therapy Evaluation Patient Details Name: Catherine [name] MRN: [id number] DOB: Oct 17, 1941 Today's Date: 03/10/2017   History of Present Illness  76 y.o.femalewith medical history significant of Hypothyroidism, AS s/p TAVR, RA/FM, HTN, DM2, dCHF, CAD, h/o breast cancer in 1980 who presents with dizziness when looking left and L arm numbness. Pt admit for possible TIA. Pas surgical h/o pacemaker implant (2016).   Clinical Impression  Pt presents with decreased strength, balance and activity tolerance secondary to above. Pt significantly limited by c/o dizziness. Pt with positive nystagmus with R Dix-Hallpike Maneuver suggesting R BPPV.  Pt treated with [place] Maneuver for relief of R posterior canal BPPV. Pt and family education for safety with transfers and gaze stabilization with movement to reduce risk of falls, as well as the need for follow up OP vestibular PT for continued treatment of BPPV. Anticipate pt will tolerate ambulation and stairs once dizziness minimized through treatment for BPPV for safe d/c home with 24/7 supervision. Acute PT will follow.     Follow Up Recommendations Supervision/Assistance - 24 hour;Outpatient PT (pending functional assessment. will need OP vestibular PT)    Equipment Recommendations  None recommended by PT    Recommendations for Other Services       Precautions / Restrictions Precautions Precautions: Fall Restrictions Weight Bearing Restrictions: No      Mobility  Bed Mobility Overal bed mobility: Needs Assistance Bed Mobility: Sidelying to Sit;Sit to Sidelying;Rolling Rolling: Mod assist Sidelying to sit: Mod assist     Sit to sidelying: Mod assist General bed mobility comments: modA to elevate trunk with bed mobility. physical assist required to elevate LE to EOB. physical assist required to maintain sidelying.   Transfers Overall transfer level: Needs assistance Equipment used: 2 person hand held assist Transfers: Stand Pivot  Transfers;Sit to/from Stand Sit to Stand: Mod assist;+2 physical assistance Stand pivot transfers: Mod assist;+2 physical assistance       General transfer comment: physical assist to power up. repeated verbal cues for visual fixation on steady object throughout transition. verbal cues for sequencing of steps to chair.   Ambulation/Gait             General Gait Details: limited to steps to chair to enhance effects of S[place]ont Maneuver performed for relief of BPPV symptoms  Stairs            Wheelchair Mobility    Modified Rankin (Stroke Patients Only) Modified Rankin (Stroke Patients Only) Pre-Morbid Rankin Score: No symptoms Modified Rankin: Moderately severe disability     Balance Overall balance assessment: Needs assistance Sitting-balance support: Feet supported;Bilateral upper extremity supported Sitting balance-Leahy Scale: Poor Sitting balance - Comments: pt initially with posterior left lateral lean, however, can correct with verbal cues. Pt needs repeated verbal cues for correction of posture due to decreased stamina   Standing balance support: Bilateral upper extremity supported Standing balance-Leahy Scale: Poor Standing balance comment: reliant on physical assist                              Pertinent Vitals/Pain Pain Assessment: No/denies pain    Home Living Family/patient expects to be discharged to:: Private residence Living Arrangements: Spouse/significant other;Children Available Help at Discharge: Family;Available 24 hours/day Type of Home: House Home Access: Stairs to enter Entrance Stairs-Rails: Right;Left (cannot reach both simultaneously) Entrance Stairs-Number of Steps: 6 Home Layout: Two level;Able to live on main level with bedroom/bathroom Home Equipment: Gilford Rile - 2 wheels;Cane - single  point;Shower seat;Grab bars - toilet;Grab bars - tub/shower Additional Comments: husband and daugther can provide 24/7 assist upon d/c     Prior Function Level of Independence: Independent with assistive device(s)         Comments: mod-I with use of SPC with intermittent use of RW     Hand Dominance   Dominant Hand: Right    Extremity/Trunk Assessment   Upper Extremity Assessment Upper Extremity Assessment: Generalized weakness    Lower Extremity Assessment Lower Extremity Assessment: Generalized weakness    Cervical / Trunk Assessment Cervical / Trunk Assessment: Kyphotic (limited cervical ROM)  Communication   Communication: No difficulties  Cognition Arousal/Alertness: Awake/alert Behavior During Therapy: WFL for tasks assessed/performed Overall Cognitive Status: Impaired/Different from baseline Area of Impairment: Memory;Following commands;Safety/judgement;Awareness;Problem solving                     Memory: Decreased recall of precautions;Decreased short-term memory Following Commands: Follows one step commands inconsistently;Follows one step commands with increased time Safety/Judgement: Decreased awareness of safety Awareness: Emergent Problem Solving: Slow processing;Difficulty sequencing;Requires verbal cues;Requires tactile cues General Comments: per daughter, decrease short term memory present PTA. pt requires multimodal cues for sequencing for transition to chair      General Comments General comments (skin integrity, edema, etc.): Pt reports complaints of dizziness with position changes (i.e. turning over in bed, sitting up, lying down, etc.)  VESTIBULAR ASSESSMENT: pt with positive nystagmus with R dix hall pike with c/o  dizziness. negative on L dix hall pike. Pt treated with Semont maneuver and given gaze stabiliation exercises    Exercises     Assessment/Plan    PT Assessment Patient needs continued PT services  PT Problem List Decreased strength;Decreased activity tolerance;Decreased balance;Decreased mobility;Decreased cognition;Decreased safety awareness;Decreased  knowledge of precautions       PT Treatment Interventions DME instruction;Stair training;Gait training;Functional mobility training;Therapeutic activities;Therapeutic exercise;Balance training;Neuromuscular re-education;Patient/family education;Cognitive remediation    PT Goals (Current goals can be found in the Care Plan section)  Acute Rehab PT Goals Patient Stated Goal: return home PT Goal Formulation: With patient Time For Goal Achievement: 03/24/17 Potential to Achieve Goals: Good Additional Goals Additional Goal #1: Pt will verbalize and demonstrate understanding of vestibular precautions and compensatory behaviors for reduced risk of falls.     Frequency Min 3X/week   Barriers to discharge        Co-evaluation               End of Session   Activity Tolerance: Patient tolerated treatment well Patient left: in chair;with call bell/phone within reach;with chair alarm set;with family/visitor present Nurse Communication: Mobility status;Other (comment) (BPPV maneuver performed) PT Visit Diagnosis: Unsteadiness on feet (R26.81);BPPV;Dizziness and giddiness (R42);Other abnormalities of gait and mobility (R26.89) BPPV - Right/Left : Right    Time: 0727-0830 PT Time Calculation (min) (ACUTE ONLY): 63 min   Charges:   PT Evaluation $PT Eval High Complexity: 1 Procedure PT Treatments $Therapeutic Activity: 23-37 mins $Canalith Rep Proc: 8-22 mins   PT G Codes:   PT G-Codes **NOT FOR INPATIENT CLASS** Functional Assessment Tool Used: Clinical judgement Functional Limitation: Mobility: Walking and moving around Mobility: Walking and Moving Around Current Status (G3875): At least 40 percent but less than 60 percent impaired, limited or restricted Mobility: Walking and Moving Around Goal Status [phone]): At least 1 percent but less than 20 percent impaired, limited or restricted    Tracie Harrier, SPT Acute Rehab SPT [place] 03/10/2017, 10:13  AM

## 2017-03-10 NOTE — Evaluation (Signed)
Speech Language Pathology Evaluation Patient Details Name: Catherine [name] MRN: [id number] DOB: March 14, 1941 Today's Date: 03/10/2017 Time: 9242-6834 SLP Time Calculation (min) (ACUTE ONLY): 16 min  Problem List:  Patient Active Problem List   Diagnosis Date Noted  . CVA (cerebral vascular accident) ([place])   . TIA (transient ischemic attack) 03/09/2017  . Dizziness   . Suicidal ideation 03/06/2016  . Severe recurrent major depression without psychotic features ([place])   . Chest pain 09/24/2015  . Hypokalemia 09/07/2015  . IBS (irritable bowel syndrome)   . Cancer ([place])   . Pneumothorax 09/05/2015  . Complete heart block ([place])   . S/P TAVR (transcatheter aortic valve replacement) 08/30/2015  . Type II diabetes mellitus ([place])   . Essential hypertension   . Chronic diastolic congestive heart failure ([place])   . Rheumatoid arthritis ([place])   . Hypothyroidism   . Fibromyalgia   . Aortic valve stenosis, critical 08/22/2015  . MDD (major depressive disorder) ([place]) 05/26/2012   Past Medical History:  Past Medical History:  Diagnosis Date  . Acute colitis   . Anemia   . CAD (coronary artery disease)    a. heavy calcification of the entire LAD & mod eccentric mid LAD stenosis, estimated @ 70%, mild nonobs stenosis of RCA and LCx, severely calcified Ao valve with restricted valve mobility and 2+ AI    . Cancer [facility]) 1981   breast  . CHB (complete heart block) ([place]) 08/31/2015   [place] DR model HD6222 (serial number [id number] )   . Chronic diastolic congestive heart failure ([place])    a. echo 08/31/2015: EF 65-70%, nl WM, GR1DD, Ao valve stent bioprosthesis was present and functioning nl, no regurg, LA mildly dilated, PASP 46 mm Hg  . Colon polyps   . Depression   . Diabetes mellitus type II   . Fibromyalgia   . HTN (hypertension)   . Hypercholesteremia   . Hypothyroidism   . IBS (irritable bowel syndrome)   . Rectal bleeding   . Rheumatoid arthritis(714.0)   . S/P TAVR  (transcatheter aortic valve replacement) 08/30/2015   26 mm Edwards Sapien 3 transcatheter heart valve placed via open right transfemoral approach  . Shortness of breath dyspnea   . Stenosis of aortic valve   . Thyroid disease    Past Surgical History:  Past Surgical History:  Procedure Laterality Date  . ABDOMINAL SURGERY     Intestinal surgery  . CARDIAC SURGERY    . CESAREAN SECTION     x 2  . EP IMPLANTABLE DEVICE N/A 08/31/2015   Procedure: Pacemaker Implant;  Surgeon: Will Meredith Leeds, MD; [place] (serial number [id number] ); Laterality: Right  . LEFT OOPHORECTOMY    . MASTECTOMY Left   . polyp     . self inflicted chest wound    . [place]   lower back  . TEE WITHOUT CARDIOVERSION N/A 08/30/2015   Procedure: TRANSESOPHAGEAL ECHOCARDIOGRAM (TEE);  Surgeon: Sherren Mocha, MD;  Location: [facility];  Service: Open Heart Surgery;  Laterality: N/A;  . TOTAL ABDOMINAL HYSTERECTOMY    . TRANSCATHETER AORTIC VALVE REPLACEMENT, TRANSFEMORAL N/A 08/30/2015   Procedure: TRANSCATHETER AORTIC VALVE REPLACEMENT, TRANSFEMORAL;  Surgeon: Sherren Mocha, MD;  Location: [facility];  Service: Open Heart Surgery;  Laterality: N/A;   HPI:  Catherine Czaplicki Welkeris a 76 y.o.femalewith medical history significant of Hypothyroidism, AS s/p TAVR, RA/FM, HTN, DM2, dCHF, CAD, h/o breast cancer in 1980 who  presents for acute neurological symptoms. Catherine [name] reports that she was in her normal state of health this morning until about 730am when she acutely developed room spinning sensation, diplopia and left arm weakness and numbness. She had not had symptoms like this before. She did not lose consciousness. She is mostly sedentary due to generalized weakness and knee pain due to arthritis, so she did not notice any leg weakness. She also reported left head an neck pain. She reported to the ED and the symptoms slowly resolved. She continues to have vertiginous symptoms with  moving her eyes, mainly to the left, and some heaviness and tingling in her left arm. She has a history of TAVR and has been off of Plavix now since November of 2017 based on review of Cardiology notes. Her husband notes that she regularly seems to have word finding difficulties in the morning, but this has been going on for a few months. She has chronic mild SOB with activity. She has swelling in the legs that come and go. About 4 days ago she had some left sided abdominal pain with diarrhea and 2 days ago she had chills all day that then resolved. CT of head is normal for age and chest xray is negative for acute findings.     Assessment / Plan / Recommendation Clinical Impression  Cognitive/linguistic and motor speech evaluation was completed.  Oral mechanism exam was completed and unremarkable.  The patient's speech is clear and easy to understand.  No discernible dysarthria is noted.  The patient achieved a score of 25/30 on the Mini Mental State Exam.  Deficits were noted for delayed recall.  The patient was only able to recall 1/3 novel words independently after a short delay.  Semantic cue did facilitate recall of other 2 novel words.  The patient was oriented to person, place and situation.  She knew the month, year and day of the week but not the date.  She easily named objects, followed a 3 step command, read/understood short phrase, wrote a short sentence and copied a design.  PT reported that the patient seemed to have issues answering questions and processing things particularly during motoric tasks.  Given this will keep patient on ST caseload to assess further possible need for f/u ST at next level of care.      SLP Assessment  SLP Recommendation/Assessment: Patient needs continued Speech Lanaguage Pathology Services SLP Visit Diagnosis: Cognitive communication deficit (R41.841)    Follow Up Recommendations  Other (comment) (continued ST for in depth cognitive evaluation)     Frequency and Duration min 1 x/week  2 weeks      SLP Evaluation Cognition  Overall Cognitive Status: Within Functional Limits for tasks assessed Arousal/Alertness: Awake/alert Orientation Level: Oriented to person;Oriented to place;Oriented to situation;Disoriented to time (Pt knew month, year and day of week but not date.  ) Attention: Sustained Sustained Attention: Appears intact Memory: Impaired Memory Impairment: Decreased recall of new information Awareness: Appears intact Problem Solving: Appears intact Safety/Judgment: Appears intact       Comprehension  Auditory Comprehension Overall Auditory Comprehension: Appears within functional limits for tasks assessed Commands: Within Functional Limits Conversation: Simple Reading Comprehension Reading Status: Within funtional limits    Expression Expression Primary Mode of Expression: Verbal Verbal Expression Overall Verbal Expression: Appears within functional limits for tasks assessed Initiation: No impairment Automatic Speech: Name;Social Response Level of Generative/Spontaneous Verbalization: Conversation Repetition: No impairment Naming: No impairment Pragmatics: No impairment Non-Verbal Means of Communication: Not applicable  Written Expression Dominant Hand: Right Written Expression: Within Functional Limits   Oral / Motor  Oral Motor/Sensory Function Overall Oral Motor/Sensory Function: Within functional limits Motor Speech Overall Motor Speech: Appears within functional limits for tasks assessed Respiration: Within functional limits Phonation: Normal Resonance: Within functional limits Articulation: Within functional limitis Intelligibility: Intelligible Motor Planning: Witnin functional limits Motor Speech Errors: Not applicable   GO          Functional Assessment Tool Used: ASHA NOMS and clinical judgment.   Functional Limitations: Memory Memory Current Status [phone]): At least 1 percent but less  than 20 percent impaired, limited or restricted Memory Goal Status (N0272): At least 1 percent but less than 20 percent impaired, limited or restricted          Shelly Flatten, [place], [place] Acute Rehab SLP [place] 03/10/2017, 2:43 PM

## 2017-03-10 NOTE — Progress Notes (Signed)
[facility] PROGRESS NOTE  Catherine [name] ZOX:[id number] DOB: 11-08-1941 DOA: 03/09/2017   PCP: Jilda Panda, MD     Assessment/Plan: Active Problems:   MDD (major depressive disorder) (HCC)   Aortic valve stenosis, critical   Type II diabetes mellitus ([place])   Essential hypertension   Chronic diastolic congestive heart failure (HCC)   Rheumatoid arthritis (HCC)   Hypothyroidism   Fibromyalgia   S/P TAVR (transcatheter aortic valve replacement)   Complete heart block (HCC)   IBS (irritable bowel syndrome)   TIA (transient ischemic attack)   76 y.o. female with medical history significant of Hypothyroidism, AS s/p TAVR, RA/FM, HTN, DM2, dCHF, CAD, h/o breast cancer in 1980 who presents for acute neurological symptoms.  Catherine [name] reports that she was in her normal state of health this morning until about 730am when she acutely developed room spinning sensation, diplopia and left arm weakness and numbness. Unfortunately, she has a pacemaker which is not compatible with the MRI.  Neurology was consulted.    Assessment and plan Posterior circulation TIA RBBB and LAFB on EKG. No atrial fibrillation Continue telemetry TTE. If negative for mural thrombus or valvular vegetation, obtain TEE.  CTA of head and neck to evaluate for possible critical stenosis.  Repeat CT head in > 3 days to assess for possible thalamic infarction not detectable on initial CT. PT consult, OT consult, Speech consult Continue ASA and atorvastatin.  Permissive HTN x 24-48 hours Unable to perform MRI due to pacemaker.   If patient is able to tolerate Plavix, this may need to be restarted (ASA and Plavix dual antiplatelet therapy for secondary stroke prevention).  HgbA1c, fasting lipid panel-LDL 114  Type II diabetes mellitus  - Last A1C in our system from 2016 - 6.5 - Hold oral medications as awaiting swallow screening - SSI    Hypokalemia-replete  Essential hypertension - BP on the low side  tonight - Hold antihypertensives to allow permissive HTN for 24-48 hours - Hold lisinopril, metoprolol, tamsulosin  Aortic valve stenosis, critical S/P TAVR (transcatheter aortic valve replacement) - On a daily aspirin, may need to resume Plavix per neurology - No acute issues, some SOB with activity, but she has limited activity due to knee pain and debility  Complete heart block  s/p PCM placement - PCM  in place - Monitor on telemetry - Hold beta blocker  Chronic diastolic congestive heart failure  - She reports intermittent swelling - Last TTE in our system form 2017 shows grade 1 failure - Holding antihypertensives as above.     MDD (major depressive disorder)  - She is taking bupropion and paroxetine, these were continued - Trazodone for sleep   Rheumatoid arthritis/Fibromyalgia - Currently taking meloxicam daily, held in the acute setting, restart if needed - PRN tylenol for pain    Hypothyroidism  TSH 7.99, check free T4 - Continue synthroid    IBS (irritable bowel syndrome) - She is on daily colace, had diarrhea a couple of days ago and now with abdominal pain Abdominal x-ray unremarkable   check liver function, lipase   DVT prophylaxsis Lovenox  Code Status:  Full code    Family Communication: Discussed in detail with the patient's daughter, all imaging results, lab results explained to the patient   Disposition Plan:  Complete stroke workup, anticipate discharge tomorrow      Consultants:  Neurology  Procedures:  None  Antibiotics: Anti-infectives    None  HPI/Subjective: Patient denies any slurred speech or unilateral weakness, vertigo, diplopia, left arm weakness is improving  Objective: Vitals:   03/10/17 0100 03/10/17 0300 03/10/17 0500 03/10/17 0652  BP: (!) 109/50 (!) 116/46 (!) 120/48 (!) 117/48  Pulse: 63 61 62 61  Resp: 18 18 18 18   Temp: 97.9 F (36.6 C) 97.8 F (36.6 C) 98 F (36.7 C) 98 F (36.7 C)   TempSrc: Oral Oral Oral Oral  SpO2: 98% 97% 98% 96%  Weight:      Height:       No intake or output data in the 24 hours ending 03/10/17 0836  Exam:  Examination:  General exam: Appears calm and comfortable  Respiratory system: Clear to auscultation. Respiratory effort normal. Cardiovascular system: S1 & S2 heard, RRR. No JVD, murmurs, rubs, gallops or clicks. No pedal edema. Gastrointestinal system: Abdomen is nondistended, soft and nontender. No organomegaly or masses felt. Normal bowel sounds heard. Central nervous system: Alert and oriented. No focal neurological deficits. Extremities: Symmetric 5 x 5 power. Skin: No rashes, lesions or ulcers Psychiatry: Judgement and insight appear normal. Mood & affect appropriate.     Data Reviewed: I have personally reviewed following labs and imaging studies  Micro Results No results found for this or any previous visit (from the past 240 hour(s)).  Radiology Reports Dg Chest 2 View  Result Date: 03/09/2017 CLINICAL DATA:  Dizziness, vertigo, diplopia, left arm weakness and numbness. EXAM: CHEST  2 VIEW COMPARISON:  06/29/2016 CXR FINDINGS: Heart is top-normal in size. Aortic arch atherosclerosis. No aneurysm. Minimal atelectasis and/or scarring at the left lung base. No overt pulmonary edema, pneumothorax nor effusion. Status post aortic valvular replacement. Right atrial and right ventricular leads are noted with right-sided pacemaker apparatus in place. AC and glenohumeral joint osteoarthritis is identified bilaterally. Chronic left lower rib fractures. IMPRESSION: No active cardiopulmonary disease. Minimal left basilar atelectasis and/or scarring. Aortic atherosclerosis. Right-sided pacemaker apparatus in place with right atrial and right ventricular leads. Electronically Signed   By: Ashley Royalty M.D.   On: 03/09/2017 23:26   Dg Abd 1 View  Result Date: 03/09/2017 CLINICAL DATA:  Left-sided abdominal pain with diarrhea 4 days ago. EXAM:  ABDOMEN - 1 VIEW COMPARISON:  None. FINDINGS: The bowel gas pattern is normal. Degenerate changes are seen along the dorsal spine. Partially visualized aortic valvular replacement and right ventricular lead from a pacemaker apparatus. No radio-opaque calculi or other significant radiographic abnormality are seen. 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 Vascular clips project over the right hip. IMPRESSION: Unremarkable bowel gas pattern. Degenerative changes are noted along the dorsal spine, pubic symphysis and both hip joints. Electronically Signed   By: Ashley Royalty M.D.   On: 03/09/2017 23:27   Ct Head Wo Contrast  Result Date: 03/09/2017 CLINICAL DATA:  Persistent dizziness beginning at 7:30 a.m. today. Resolved diplopia and LEFT arm weakness with mild residual LEFT arm numbness. History of diabetes, hypertension and hypercholesterolemia. EXAM: CT HEAD WITHOUT CONTRAST TECHNIQUE: Contiguous axial images were obtained from the base of the skull through the vertex without intravenous contrast. COMPARISON:  CT HEAD February 24, 2016 FINDINGS: BRAIN: No intraparenchymal hemorrhage, mass effect nor midline shift. The ventricles and sulci are normal for age. Patchy supratentorial white matter hypodensities less than expected for patient's age, though non-specific are most compatible with chronic small vessel ischemic disease. No acute large vascular territory infarcts. No abnormal extra-axial fluid collections. Basal cisterns are patent. VASCULAR: Mild to moderate calcific atherosclerosis  of the carotid siphons. SKULL: No skull fracture. Moderate temporomandibular osteoarthrosis. No significant scalp soft tissue swelling. SINUSES/ORBITS: Mild paranasal sinus mucosal thickening. Mastoid air cells are well aerated. The included ocular globes and orbital contents are non-suspicious. OTHER: Patient is edentulous. IMPRESSION: Normal CT HEAD for age. Electronically Signed   By: Elon Alas M.D.   On:  03/09/2017 17:56     CBC  Recent Labs Lab 03/09/17 1724 03/09/17 1850 03/10/17 0424  WBC 8.5  --  8.0  HGB 10.9* 10.9* 10.9*  HCT 34.1* 32.0* 34.9*  PLT 236  --  199  MCV 85.7  --  86.2  MCH 27.4  --  26.9  MCHC 32.0  --  31.2  RDW 14.1  --  14.3  LYMPHSABS 1.8  --   --   MONOABS 0.8  --   --   EOSABS 0.3  --   --   BASOSABS 0.0  --   --     Chemistries   Recent Labs Lab 03/09/17 1724 03/09/17 1850 03/10/17 0424  NA 138 139 139  K 3.8 3.8 3.4*  CL 103 101 103  CO2 27  --  28  GLUCOSE 112* 112* 104*  BUN 13 16 10   CREATININE 0.91 1.00 0.93  CALCIUM 9.1  --  9.0  AST 18  --   --   ALT 10*  --   --   ALKPHOS 58  --   --   BILITOT 0.2*  --   --    ------------------------------------------------------------------------------------------------------------------ estimated creatinine clearance is 54.7 mL/min (by C-G formula based on SCr of 0.93 mg/dL). ------------------------------------------------------------------------------------------------------------------ No results for input(s): HGBA1C in the last 72 hours. ------------------------------------------------------------------------------------------------------------------  Recent Labs  03/10/17 0424  CHOL 186  HDL 43  LDLCALC 114*  TRIG 147  CHOLHDL 4.3   ------------------------------------------------------------------------------------------------------------------  Recent Labs  03/10/17 0424  TSH 7.993*   ------------------------------------------------------------------------------------------------------------------ No results for input(s): VITAMINB12, FOLATE, FERRITIN, TIBC, IRON, RETICCTPCT in the last 72 hours.  Coagulation profile  Recent Labs Lab 03/09/17 1724  INR 1.04    No results for input(s): DDIMER in the last 72 hours.  Cardiac Enzymes No results for input(s): CKMB, TROPONINI, MYOGLOBIN in the last 168 hours.  Invalid input(s):  CK ------------------------------------------------------------------------------------------------------------------ Invalid input(s): POCBNP   CBG:  Recent Labs Lab 03/09/17 2311 03/10/17 0630  GLUCAP 167* 151*       Studies: Dg Chest 2 View  Result Date: 03/09/2017 CLINICAL DATA:  Dizziness, vertigo, diplopia, left arm weakness and numbness. EXAM: CHEST  2 VIEW COMPARISON:  06/29/2016 CXR FINDINGS: Heart is top-normal in size. Aortic arch atherosclerosis. No aneurysm. Minimal atelectasis and/or scarring at the left lung base. No overt pulmonary edema, pneumothorax nor effusion. Status post aortic valvular replacement. Right atrial and right ventricular leads are noted with right-sided pacemaker apparatus in place. AC and glenohumeral joint osteoarthritis is identified bilaterally. Chronic left lower rib fractures. IMPRESSION: No active cardiopulmonary disease. Minimal left basilar atelectasis and/or scarring. Aortic atherosclerosis. Right-sided pacemaker apparatus in place with right atrial and right ventricular leads. Electronically Signed   By: Ashley Royalty M.D.   On: 03/09/2017 23:26   Dg Abd 1 View  Result Date: 03/09/2017 CLINICAL DATA:  Left-sided abdominal pain with diarrhea 4 days ago. EXAM: ABDOMEN - 1 VIEW COMPARISON:  None. FINDINGS: The bowel gas pattern is normal. Degenerate changes are seen along the dorsal spine. Partially visualized aortic valvular replacement and right ventricular lead from a pacemaker apparatus. No radio-opaque calculi or  other significant radiographic abnormality are seen. 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 Vascular clips project over the right hip. IMPRESSION: Unremarkable bowel gas pattern. Degenerative changes are noted along the dorsal spine, pubic symphysis and both hip joints. Electronically Signed   By: Ashley Royalty M.D.   On: 03/09/2017 23:27   Ct Head Wo Contrast  Result Date: 03/09/2017 CLINICAL DATA:  Persistent dizziness  beginning at 7:30 a.m. today. Resolved diplopia and LEFT arm weakness with mild residual LEFT arm numbness. History of diabetes, hypertension and hypercholesterolemia. EXAM: CT HEAD WITHOUT CONTRAST TECHNIQUE: Contiguous axial images were obtained from the base of the skull through the vertex without intravenous contrast. COMPARISON:  CT HEAD February 24, 2016 FINDINGS: BRAIN: No intraparenchymal hemorrhage, mass effect nor midline shift. The ventricles and sulci are normal for age. Patchy supratentorial white matter hypodensities less than expected for patient's age, though non-specific are most compatible with chronic small vessel ischemic disease. No acute large vascular territory infarcts. No abnormal extra-axial fluid collections. Basal cisterns are patent. VASCULAR: Mild to moderate calcific atherosclerosis of the carotid siphons. SKULL: No skull fracture. Moderate temporomandibular osteoarthrosis. No significant scalp soft tissue swelling. SINUSES/ORBITS: Mild paranasal sinus mucosal thickening. Mastoid air cells are well aerated. The included ocular globes and orbital contents are non-suspicious. OTHER: Patient is edentulous. IMPRESSION: Normal CT HEAD for age. Electronically Signed   By: Elon Alas M.D.   On: 03/09/2017 17:56      Lab Results  Component Value Date   HGBA1C 6.5 (H) 08/25/2015   Lab Results  Component Value Date   LDLCALC 114 (H) 03/10/2017   CREATININE 0.93 03/10/2017       Scheduled Meds: . aspirin EC  81 mg Oral Daily  . atorvastatin  40 mg Oral QPM  . buPROPion  300 mg Oral Daily  . clonazePAM  1 mg Oral QHS  . docusate sodium  100 mg Oral BID  . enoxaparin (LOVENOX) injection  40 mg Subcutaneous QHS  . insulin aspart  0-15 Units Subcutaneous TID WC  . insulin aspart  0-5 Units Subcutaneous QHS  . levothyroxine  100 mcg Oral QAC breakfast  . multivitamin with minerals  1 tablet Oral Q1200  . pantoprazole  40 mg Oral Daily  . PARoxetine  30 mg Oral Daily    Continuous Infusions:   LOS: 0 days    Time spent: >30 MINS    Reyne Dumas  [facility] Pager [phone]. If 7PM-7AM, please contact night-coverage at [email or web address], password [facility] 03/10/2017, 8:36 AM  LOS: 0 days

## 2017-03-11 ENCOUNTER — Observation Stay (HOSPITAL_BASED_OUTPATIENT_CLINIC_OR_DEPARTMENT_OTHER): Payer: Medicare Other

## 2017-03-11 ENCOUNTER — Ambulatory Visit (HOSPITAL_COMMUNITY): Payer: Self-pay | Admitting: Psychiatry

## 2017-03-11 DIAGNOSIS — I5032 Chronic diastolic (congestive) heart failure: Secondary | ICD-10-CM | POA: Diagnosis not present

## 2017-03-11 DIAGNOSIS — I442 Atrioventricular block, complete: Secondary | ICD-10-CM

## 2017-03-11 DIAGNOSIS — G459 Transient cerebral ischemic attack, unspecified: Secondary | ICD-10-CM

## 2017-03-11 DIAGNOSIS — H8113 Benign paroxysmal vertigo, bilateral: Secondary | ICD-10-CM

## 2017-03-11 DIAGNOSIS — R1013 Epigastric pain: Secondary | ICD-10-CM

## 2017-03-11 DIAGNOSIS — R109 Unspecified abdominal pain: Secondary | ICD-10-CM

## 2017-03-11 LAB — GLUCOSE, CAPILLARY
GLUCOSE-CAPILLARY: 137 mg/dL — AB (ref 65–99)
Glucose-Capillary: 138 mg/dL — ABNORMAL HIGH (ref 65–99)
Glucose-Capillary: 140 mg/dL — ABNORMAL HIGH (ref 65–99)
Glucose-Capillary: 61 mg/dL — ABNORMAL LOW (ref 65–99)
Glucose-Capillary: 164 mg/dL — ABNORMAL HIGH (ref 65–99)
Glucose-Capillary: 153 mg/dL — ABNORMAL HIGH (ref 65–99)

## 2017-03-11 LAB — COMPREHENSIVE METABOLIC PANEL
ALT: 10 U/L — ABNORMAL LOW (ref 14–54)
ANION GAP: 7 (ref 5–15)
AST: 16 U/L (ref 15–41)
Albumin: 3.2 g/dL — ABNORMAL LOW (ref 3.5–5.0)
Alkaline Phosphatase: 56 U/L (ref 38–126)
BUN: 9 mg/dL (ref 6–20)
CHLORIDE: 102 mmol/L (ref 101–111)
CO2: 28 mmol/L (ref 22–32)
Calcium: 9.2 mg/dL (ref 8.9–10.3)
Creatinine, Ser: 1 mg/dL (ref 0.44–1.00)
GFR calc Af Amer: >60 mL/min (ref >60)
GFR, EST NON AFRICAN AMERICAN: 53 mL/min — AB (ref >60)
Glucose, Bld: 189 mg/dL — ABNORMAL HIGH (ref 65–99)
POTASSIUM: 3.7 mmol/L (ref 3.5–5.1)
Sodium: 137 mmol/L (ref 135–145)
Total Bilirubin: 0.9 mg/dL (ref 0.3–1.2)
Total Protein: 6 g/dL — ABNORMAL LOW (ref 6.5–8.1)

## 2017-03-11 LAB — ECHOCARDIOGRAM COMPLETE
HEIGHTINCHES: 64 in
WEIGHTICAEL: 3136 [oz_av]

## 2017-03-11 LAB — CBC
HEMATOCRIT: 34.6 % — AB (ref 36.0–46.0)
Hemoglobin: 10.7 g/dL — ABNORMAL LOW (ref 12.0–15.0)
MCH: 27.1 pg (ref 26.0–34.0)
MCHC: 30.9 g/dL (ref 30.0–36.0)
MCV: 87.6 fL (ref 78.0–100.0)
PLATELETS: 206 10*3/uL (ref 150–400)
RBC: 3.95 MIL/uL (ref 3.87–5.11)
RDW: 14.4 % (ref 11.5–15.5)
WBC: 8.1 10*3/uL (ref 4.0–10.5)

## 2017-03-11 LAB — LIPASE, BLOOD: Lipase: 19 U/L (ref 11–51)

## 2017-03-11 LAB — HEMOGLOBIN A1C
Hgb A1c MFr Bld: 6.6 % — ABNORMAL HIGH (ref 4.8–5.6)
Mean Plasma Glucose: 143 mg/dL

## 2017-03-11 NOTE — Care Management Obs Status (Signed)
[place] NOTIFICATION   Patient Details  Name: Catherine [name] MRN: [id number] Date of Birth: 03/25/1941   Medicare Observation Status Notification Given:  Yes    Pollie Friar, RN 03/11/2017, 2:36 PM

## 2017-03-11 NOTE — Progress Notes (Signed)
STROKE TEAM PROGRESS NOTE   SUBJECTIVE (INTERVAL HISTORY) Her daughter and husband are at the bedside. Pt recounted HPI with me. She stated that she had some lightheadedness 1.5 years ago after PPM. During the interval time, she had no dizziness. However, Saturday, she got up to bathroom, while back to bed, she had vertigo after lying down in bed, severe vertigo, and blurry vision which likely due to vertigo but not diplopia. Then daughter noticed her to be scared and hyperventilating. Soon after, pt felt some tingling at left UE. Vertigo short lasting as per husband, but 15-81min as per pt. EMS called and she was brought over for evaluation. Not able to have MRI due to PPM. Currently, stroke work up negative.   OBJECTIVE Temp:  [98 F (36.7 C)-98.7 F (37.1 C)] 98.2 F (36.8 C) (04/09 2125) Pulse Rate:  [71-88] 80 (04/09 2125) Cardiac Rhythm: Normal sinus rhythm;Bundle branch block (04/09 1900) Resp:  [18] 18 (04/09 2125) BP: (117-167)/(43-72) 138/49 (04/09 2125) SpO2:  [97 %-100 %] 98 % (04/09 2125)  CBC:   Recent Labs Lab 03/09/17 1724  03/10/17 0424 03/11/17 0420  WBC 8.5  --  8.0 8.1  NEUTROABS 5.5  --   --   --   HGB 10.9*  < > 10.9* 10.7*  HCT 34.1*  < > 34.9* 34.6*  MCV 85.7  --  86.2 87.6  PLT 236  --  199 206  < > = values in this interval not displayed.  Basic Metabolic Panel:   Recent Labs Lab 03/10/17 0424 03/10/17 1007 03/11/17 0420  NA 139  --  137  K 3.4*  --  3.7  CL 103  --  102  CO2 28  --  28  GLUCOSE 104*  --  189*  BUN 10  --  9  CREATININE 0.93  --  1.00  CALCIUM 9.0  --  9.2  MG  --  1.9  --     Lipid Panel:     Component Value Date/Time   CHOL 186 03/10/2017 0424   TRIG 147 03/10/2017 0424   HDL 43 03/10/2017 0424   CHOLHDL 4.3 03/10/2017 0424   VLDL 29 03/10/2017 0424   LDLCALC 114 (H) 03/10/2017 0424   HgbA1c:  Lab Results  Component Value Date   HGBA1C 6.6 (H) 03/10/2017   Urine Drug Screen:     Component Value Date/Time    LABOPIA NONE DETECTED 03/09/2017 1724   COCAINSCRNUR NONE DETECTED 03/09/2017 1724   COCAINSCRNUR NONE DETECTED 03/06/2016 1942   LABBENZ NONE DETECTED 03/09/2017 1724   AMPHETMU NONE DETECTED 03/09/2017 1724   THCU NONE DETECTED 03/09/2017 1724   LABBARB NONE DETECTED 03/09/2017 1724     IMAGING I have personally reviewed the radiological images below and agree with the radiology interpretations  Ct Head Wo Contrast 03/09/2017 Normal CT HEAD for age.   Ct Angio Head W Or Wo Contrast 03/10/2017 IMPRESSION: 1. Negative CTA for large vessel occlusion. 2. Severe stenoses involving the P2 segments bilaterally. 3. Smooth atheromatous plaque throughout the carotid siphons with moderate diffuse narrowing, right worse than left. 4. Hypoplastic/absent right A1 segment, with the anterior cerebral arteries supplied via the left carotid artery system.   Ct Angio Neck W Or Wo Contrast 03/10/2017 IMPRESSION: 1. Atheromatous stenosis about the proximal ICAs bilaterally, with associated short-segment stenosis of up to 40% by NASCET criteria, right slightly worse than left. 2. Patent vertebral arteries within the neck.   TTE - LVEF 60-65%.  A TAVR bioprosthesis sits well in the aortic position. There are normal transaortic gradients and unchaged from prior on 10/09/2016.  There is no central AI or paravalvular leak.  CT repeat pending    PHYSICAL EXAM  Temp:  [98 F (36.7 C)-98.7 F (37.1 C)] 98.2 F (36.8 C) (04/09 2125) Pulse Rate:  [71-88] 80 (04/09 2125) Resp:  [18] 18 (04/09 2125) BP: (117-167)/(43-72) 138/49 (04/09 2125) SpO2:  [97 %-100 %] 98 % (04/09 2125)  General - Well nourished, well developed, in no apparent distress.  Cardiovascular - Regular rate and rhythm.  Mental Status -  Level of arousal and orientation to time, place, and person were intact. Language including expression, naming, repetition, comprehension was assessed and found intact.  Cranial Nerves II - XII - II -  Visual field intact OU. III, IV, VI - Extraocular movements intact. V - symmetrical VII - Facial movement intact bilaterally. VIII - Hearing & vestibular intact bilaterally. X - Palate elevates symmetrically. XI - Chin turning & shoulder shrug intact bilaterally. XII - Tongue protrusion intact.  Motor Strength - The patient's strength was normal in all extremities and pronator drift was absent.  Bulk was normal and fasciculations were absent.    Motor Tone - Muscle tone was assessed at the neck and appendages and was normal.  Reflexes - The patient's reflexes were 1+ in all extremities and she had no pathological reflexes.  Sensory - Light touch in tact throughout   Coordination - The patient had normal movements in the hands and feet with no ataxia or dysmetria.   Gait and Station - deferred  [employer] test negative for vertigo and nystagmus but complaining of lightheadedness. However, horizontal BPPV test showed positive nystagmus and feeling of vertigo lasting 30 sec on the right and 65min on the left.    ASSESSMENT/PLAN Catherine [name] is a 76 y.o. female with history of hypothyroidism, transcatheter aortic valve replacement, rheumatoid arthritis, history of rectal bleeding, hyperlipidemia, hypertension, diabetes mellitus, depression, congestive heart failure, complete heart block status post pacemaker, history of breast cancer, and history of coronary artery disease presenting with vertigo, left sided numbness, left-sided headache, neck pain, and nausea. She did not receive IV t-PA due to Late presentation and improvement in deficits.  Likely horizontal BPPV, small brainstem infarct can not be completely ruled out at this time  Resultant mild dizziness with motion, horizontal BPPV testing positive   MRI / MRA - PPM  CTA head and neck -  Right ICA 40% stenosis and b/l P2 stenosis  2D Echo EF 60-65%  Repeat CT pending  Pacer interrogation pending  LDL -  114  HgbA1c 6.6  VTE prophylaxis - Lovenox Diet heart healthy/carb modified Room service appropriate? Yes; Fluid consistency: Thin  aspirin 81 mg daily prior to admission, now on aspirin 81 mg daily and clopidogrel 75 mg daily.   Patient counseled to be compliant with her antithrombotic medications  Ongoing aggressive stroke risk factor management  Therapy recommendations: HH PT recommended (vestibular rehab)  Disposition:  Pending  Hypertension  Stable Long-term BP goal normotensive  Hyperlipidemia  Home meds:  Lipitor 40 mg daily prior to admission resumed in hospital  LDL 114, goal < 70  Increase Lipitor to 80 mg daily  Continue statin at discharge  Diabetes  HgbA1c 6.6, goal < 7.0  Controlled  Other Stroke Risk Factors  Advanced age  Obesity, Body mass index is 33.64 kg/m., recommend weight loss, diet and exercise as appropriate  Family hx stroke (sister)  Coronary artery disease  Other Active Problems  s/p TAVR  CHB s/p pacer  Hospital day # 0   Catherine Hawking, MD PhD Stroke Neurology 03/11/2017 10:56 PM   To contact Stroke Continuity provider, please refer to [email or web address] After hours, contact General Neurology

## 2017-03-11 NOTE — Progress Notes (Signed)
Hypoglycemic Event  CBG: 61  Treatment: 15 GM carbohydrate snack  Symptoms: Sweaty  Follow-up CBG: Time:1442 CBG Result:164  Possible Reasons for Event: Inadequate meal intake  Comments/MD notified:Hypoglycemic Protocol    Ottie Glazier

## 2017-03-11 NOTE — Progress Notes (Signed)
[facility] PROGRESS NOTE  Catherine [name] DQQ:[id number] DOB: 08/24/1941 DOA: 03/09/2017   PCP: Jilda Panda, MD     Assessment/Plan: Active Problems:   MDD (major depressive disorder) (HCC)   Aortic valve stenosis, critical   Type II diabetes mellitus ([place])   Essential hypertension   Chronic diastolic congestive heart failure (HCC)   Rheumatoid arthritis (HCC)   Hypothyroidism   Fibromyalgia   S/P TAVR (transcatheter aortic valve replacement)   Complete heart block (HCC)   IBS (irritable bowel syndrome)   TIA (transient ischemic attack)   CVA (cerebral vascular accident) ([place])   76 y.o. female with medical history significant of Hypothyroidism, AS s/p TAVR, RA/FM, HTN, DM2, dCHF, CAD, h/o breast cancer in 1980 who presents for acute neurological symptoms.  Ms. Mcdiarmid reports that she was in her normal state of health this morning until about 730am when she acutely developed room spinning sensation, diplopia and left arm weakness and numbness. Unfortunately, she has a pacemaker which is not compatible with the MRI.  Neurology was consulted.    Assessment and plan Posterior circulation TIA RBBB and LAFB on EKG. No atrial fibrillation Continue telemetry TTE. If negative for mural thrombus or valvular vegetation, obtain TEE.  CTA of head and neck to evaluate for possible critical stenosis.  Repeat CT head in > 3 days, 4/10-  to assess for possible thalamic infarction not detectable on initial CT. PT consult, OT consult,-outpatient PT/OT  Speech consult-regular diet Continue ASA and atorvastatin.  Permissive HTN x 24-48 hours Unable to perform MRI due to pacemaker.   If patient is able to tolerate Plavix, this may need to be restarted (ASA and Plavix dual antiplatelet therapy for secondary stroke prevention).  HgbA1c, fasting lipid panel-LDL 114 Pacemaker interrogation requested 2-D echo pending  Type II diabetes mellitus  Hemoglobin A1c 6.6 - SSI     Hypokalemia-replete  Essential hypertension - BP on the low side tonight - Hold antihypertensives to allow permissive HTN for 24-48 hours - Hold lisinopril, metoprolol, tamsulosin  Aortic valve stenosis, critical S/P TAVR (transcatheter aortic valve replacement) - On a daily aspirin, may need to resume Plavix per neurology - No acute issues, some SOB with activity, but she has limited activity due to knee pain and debility TTE results pending  Complete heart block  s/p PCM placement - PCM  in place, interrogate - Monitor on telemetry - Hold beta blocker  Chronic diastolic congestive heart failure  - She reports intermittent swelling - Last TTE in our system form 2017 shows grade 1 failure - Holding antihypertensives as above.     MDD (major depressive disorder)  - She is taking bupropion and paroxetine, these were continued - Trazodone for sleep   Rheumatoid arthritis/Fibromyalgia - Currently taking meloxicam daily, held in the acute setting, restart if needed - PRN tylenol for pain    Hypothyroidism  TSH 7.99, check free T4 - Continue synthroid    IBS (irritable bowel syndrome) She is on daily colace, had diarrhea a couple of days ago and now with abdominal pain Abdominal x-ray unremarkable LFTs, lipase okay   DVT prophylaxsis Lovenox  Code Status:  Full code    Family Communication: Discussed in detail with the patient's daughter, all imaging results, lab results explained to the patient   Disposition Plan:  Anticipate discharge in one to 2 days, oupt PT       Consultants:  Neurology  Procedures:  None  Antibiotics: Anti-infectives  None         HPI/Subjective: Still has  vertigo, diplopia,   Objective: Vitals:   03/10/17 2132 03/11/17 0113 03/11/17 0524 03/11/17 0940  BP: (!) 140/59 (!) 117/59 (!) 138/55 (!) 132/43  Pulse: 70 77 71 88  Resp: 18 18 18 18   Temp: 98.6 F (37 C) 98.6 F (37 C)  98.2 F (36.8 C)  TempSrc: Oral  Oral  Oral  SpO2: 97% 98% 98% 98%  Weight:      Height:        Intake/Output Summary (Last 24 hours) at 03/11/17 1239 Last data filed at 03/11/17 0900  Gross per 24 hour  Intake              120 ml  Output                0 ml  Net              120 ml    Exam:  Examination:  General exam: Appears calm and comfortable  Respiratory system: Clear to auscultation. Respiratory effort normal. Cardiovascular system: S1 & S2 heard, RRR. No JVD, murmurs, rubs, gallops or clicks. No pedal edema. Gastrointestinal system: Abdomen is nondistended, soft and nontender. No organomegaly or masses felt. Normal bowel sounds heard. Central nervous system: Alert and oriented. No focal neurological deficits. Extremities: Symmetric 5 x 5 power. Skin: No rashes, lesions or ulcers Psychiatry: Judgement and insight appear normal. Mood & affect appropriate.     Data Reviewed: I have personally reviewed following labs and imaging studies  Micro Results No results found for this or any previous visit (from the past 240 hour(s)).  Radiology Reports Ct Angio Head W Or Wo Contrast  Result Date: 03/10/2017 CLINICAL DATA:  Initial evaluation for acute dizziness, diplopia, left arm weakness. EXAM: CT ANGIOGRAPHY HEAD AND NECK TECHNIQUE: Multidetector CT imaging of the head and neck was performed using the standard protocol during bolus administration of intravenous contrast. Multiplanar CT image reconstructions and MIPs were obtained to evaluate the vascular anatomy. Carotid stenosis measurements (when applicable) are obtained utilizing NASCET criteria, using the distal internal carotid diameter as the denominator. CONTRAST:  50 cc of Isovue 370. COMPARISON:  Prior CT from 03/09/2017. FINDINGS: CTA NECK FINDINGS Aortic arch: Visualized aortic arch of normal caliber. Scattered atheromatous plaque noted within the arch itself. No definite high-grade stenosis about the origin of the great vessels, although this is  incompletely visualized on this exam. Visualized subclavian arteries patent without hemodynamically significant stenosis. Right carotid system: Right common carotid artery patent from its origin to the bifurcation. The atheromatous plaque at the right carotid bifurcation/ proximal right ICA with associated short-segment stenosis of up to 40% by NASCET criteria. Right ICA widely patent distally to the skullbase. Left carotid system: Left common carotid artery patent from its origin to the bifurcation. A centric calcified plaque about the left carotid bifurcation/proximal left ICA. Associated narrowing of approximately 35-40% by NASCET criteria. Left ICA widely patent distally to the skullbase. Vertebral arteries: Both of the vertebral arteries arise from the subclavian arteries. Mild to moderate smooth narrowing within the proximal V2 segments at the level of C5-6 related to uncovertebral disease. Vertebral arteries otherwise patent within the neck without stenosis, dissection, or occlusion. Skeleton: No acute osseus abnormality. No worrisome lytic or blastic osseous lesions. Moderate multilevel degenerative spondylolysis and facet arthrosis noted. Other neck: No acute soft tissue abnormality within the neck. No adenopathy. Thyroid normal. Upper chest: Visualized upper chest within  normal limits. Partially visualized lungs are largely clear. Review of the MIP images confirms the above findings CTA HEAD FINDINGS Anterior circulation: The scattered plaque present within the horizontal petrous right ICA with mild narrowing. Petrous left ICA widely patent. Smooth atheromatous plaque within the cavernous/supraclinoid ICAs with moderate diffuse narrowing, worse on the right. ICA termini widely patent. Left A1 segment patent. Right A1 segment hypoplastic/absent, which accounts for the diminutive right ICA is compared to the left. Anterior communicating artery normal. Anterior cerebral arteries patent to their distal  aspects. M1 segments patent without stenosis or occlusion proximal right M1 segment is duplicated/fenestrated. MCA bifurcations normal. No proximal M2 occlusion. Distal MCA branches well opacified and symmetric. Posterior circulation: Vertebral arteries patent to the vertebrobasilar junction. Left vertebral artery slightly dominant. Posterior inferior cerebral arteries patent. Basilar artery widely patent to its distal aspect. Short fenestration of the proximal basilar artery noted. Superior cerebral arteries patent bilaterally. Both of the posterior cerebral arteries primarily supplied via the basilar artery. Bilateral severe P2 stenoses noted. PCAs are patent to their distal aspects. Venous sinuses: Patent. Anatomic variants: Fenestrated proximal right M1 segment and proximal basilar artery. Hypoplastic/absent right A1 segment with the ACA supplied via the left carotid artery system. Delayed phase: No pathologic enhancement. Review of the MIP images confirms the above findings IMPRESSION: CTA NECK IMPRESSION: 1. Atheromatous stenosis about the proximal ICAs bilaterally, with associated short-segment stenosis of up to 40% by NASCET criteria, right slightly worse than left. 2. Patent vertebral arteries within the neck. CTA HEAD IMPRESSION: 1. Negative CTA for large vessel occlusion. 2. Severe stenoses involving the P2 segments bilaterally. 3. Smooth atheromatous plaque throughout the carotid siphons with moderate diffuse narrowing, right worse than left. 4. Hypoplastic/absent right A1 segment, with the anterior cerebral arteries supplied via the left carotid artery system. Electronically Signed   By: Jeannine Boga M.D.   On: 03/10/2017 20:40   Dg Chest 2 View  Result Date: 03/09/2017 CLINICAL DATA:  Dizziness, vertigo, diplopia, left arm weakness and numbness. EXAM: CHEST  2 VIEW COMPARISON:  06/29/2016 CXR FINDINGS: Heart is top-normal in size. Aortic arch atherosclerosis. No aneurysm. Minimal atelectasis  and/or scarring at the left lung base. No overt pulmonary edema, pneumothorax nor effusion. Status post aortic valvular replacement. Right atrial and right ventricular leads are noted with right-sided pacemaker apparatus in place. AC and glenohumeral joint osteoarthritis is identified bilaterally. Chronic left lower rib fractures. IMPRESSION: No active cardiopulmonary disease. Minimal left basilar atelectasis and/or scarring. Aortic atherosclerosis. Right-sided pacemaker apparatus in place with right atrial and right ventricular leads. Electronically Signed   By: Ashley Royalty M.D.   On: 03/09/2017 23:26   Dg Abd 1 View  Result Date: 03/09/2017 CLINICAL DATA:  Left-sided abdominal pain with diarrhea 4 days ago. EXAM: ABDOMEN - 1 VIEW COMPARISON:  None. FINDINGS: The bowel gas pattern is normal. Degenerate changes are seen along the dorsal spine. Partially visualized aortic valvular replacement and right ventricular lead from a pacemaker apparatus. No radio-opaque calculi or other significant radiographic abnormality are seen. 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 Vascular clips project over the right hip. IMPRESSION: Unremarkable bowel gas pattern. Degenerative changes are noted along the dorsal spine, pubic symphysis and both hip joints. Electronically Signed   By: Ashley Royalty M.D.   On: 03/09/2017 23:27   Ct Head Wo Contrast  Result Date: 03/09/2017 CLINICAL DATA:  Persistent dizziness beginning at 7:30 a.m. today. Resolved diplopia and LEFT arm weakness with mild residual LEFT arm  numbness. History of diabetes, hypertension and hypercholesterolemia. EXAM: CT HEAD WITHOUT CONTRAST TECHNIQUE: Contiguous axial images were obtained from the base of the skull through the vertex without intravenous contrast. COMPARISON:  CT HEAD February 24, 2016 FINDINGS: BRAIN: No intraparenchymal hemorrhage, mass effect nor midline shift. The ventricles and sulci are normal for age. Patchy supratentorial white matter  hypodensities less than expected for patient's age, though non-specific are most compatible with chronic small vessel ischemic disease. No acute large vascular territory infarcts. No abnormal extra-axial fluid collections. Basal cisterns are patent. VASCULAR: Mild to moderate calcific atherosclerosis of the carotid siphons. SKULL: No skull fracture. Moderate temporomandibular osteoarthrosis. No significant scalp soft tissue swelling. SINUSES/ORBITS: Mild paranasal sinus mucosal thickening. Mastoid air cells are well aerated. The included ocular globes and orbital contents are non-suspicious. OTHER: Patient is edentulous. IMPRESSION: Normal CT HEAD for age. Electronically Signed   By: Elon Alas M.D.   On: 03/09/2017 17:56   Ct Angio Neck W Or Wo Contrast  Result Date: 03/10/2017 CLINICAL DATA:  Initial evaluation for acute dizziness, diplopia, left arm weakness. EXAM: CT ANGIOGRAPHY HEAD AND NECK TECHNIQUE: Multidetector CT imaging of the head and neck was performed using the standard protocol during bolus administration of intravenous contrast. Multiplanar CT image reconstructions and MIPs were obtained to evaluate the vascular anatomy. Carotid stenosis measurements (when applicable) are obtained utilizing NASCET criteria, using the distal internal carotid diameter as the denominator. CONTRAST:  50 cc of Isovue 370. COMPARISON:  Prior CT from 03/09/2017. FINDINGS: CTA NECK FINDINGS Aortic arch: Visualized aortic arch of normal caliber. Scattered atheromatous plaque noted within the arch itself. No definite high-grade stenosis about the origin of the great vessels, although this is incompletely visualized on this exam. Visualized subclavian arteries patent without hemodynamically significant stenosis. Right carotid system: Right common carotid artery patent from its origin to the bifurcation. The atheromatous plaque at the right carotid bifurcation/ proximal right ICA with associated short-segment stenosis  of up to 40% by NASCET criteria. Right ICA widely patent distally to the skullbase. Left carotid system: Left common carotid artery patent from its origin to the bifurcation. A centric calcified plaque about the left carotid bifurcation/proximal left ICA. Associated narrowing of approximately 35-40% by NASCET criteria. Left ICA widely patent distally to the skullbase. Vertebral arteries: Both of the vertebral arteries arise from the subclavian arteries. Mild to moderate smooth narrowing within the proximal V2 segments at the level of C5-6 related to uncovertebral disease. Vertebral arteries otherwise patent within the neck without stenosis, dissection, or occlusion. Skeleton: No acute osseus abnormality. No worrisome lytic or blastic osseous lesions. Moderate multilevel degenerative spondylolysis and facet arthrosis noted. Other neck: No acute soft tissue abnormality within the neck. No adenopathy. Thyroid normal. Upper chest: Visualized upper chest within normal limits. Partially visualized lungs are largely clear. Review of the MIP images confirms the above findings CTA HEAD FINDINGS Anterior circulation: The scattered plaque present within the horizontal petrous right ICA with mild narrowing. Petrous left ICA widely patent. Smooth atheromatous plaque within the cavernous/supraclinoid ICAs with moderate diffuse narrowing, worse on the right. ICA termini widely patent. Left A1 segment patent. Right A1 segment hypoplastic/absent, which accounts for the diminutive right ICA is compared to the left. Anterior communicating artery normal. Anterior cerebral arteries patent to their distal aspects. M1 segments patent without stenosis or occlusion proximal right M1 segment is duplicated/fenestrated. MCA bifurcations normal. No proximal M2 occlusion. Distal MCA branches well opacified and symmetric. Posterior circulation: Vertebral arteries patent to the vertebrobasilar  junction. Left vertebral artery slightly dominant.  Posterior inferior cerebral arteries patent. Basilar artery widely patent to its distal aspect. Short fenestration of the proximal basilar artery noted. Superior cerebral arteries patent bilaterally. Both of the posterior cerebral arteries primarily supplied via the basilar artery. Bilateral severe P2 stenoses noted. PCAs are patent to their distal aspects. Venous sinuses: Patent. Anatomic variants: Fenestrated proximal right M1 segment and proximal basilar artery. Hypoplastic/absent right A1 segment with the ACA supplied via the left carotid artery system. Delayed phase: No pathologic enhancement. Review of the MIP images confirms the above findings IMPRESSION: CTA NECK IMPRESSION: 1. Atheromatous stenosis about the proximal ICAs bilaterally, with associated short-segment stenosis of up to 40% by NASCET criteria, right slightly worse than left. 2. Patent vertebral arteries within the neck. CTA HEAD IMPRESSION: 1. Negative CTA for large vessel occlusion. 2. Severe stenoses involving the P2 segments bilaterally. 3. Smooth atheromatous plaque throughout the carotid siphons with moderate diffuse narrowing, right worse than left. 4. Hypoplastic/absent right A1 segment, with the anterior cerebral arteries supplied via the left carotid artery system. Electronically Signed   By: Jeannine Boga M.D.   On: 03/10/2017 20:40     CBC  Recent Labs Lab 03/09/17 1724 03/09/17 1850 03/10/17 0424 03/11/17 0420  WBC 8.5  --  8.0 8.1  HGB 10.9* 10.9* 10.9* 10.7*  HCT 34.1* 32.0* 34.9* 34.6*  PLT 236  --  199 206  MCV 85.7  --  86.2 87.6  MCH 27.4  --  26.9 27.1  MCHC 32.0  --  31.2 30.9  RDW 14.1  --  14.3 14.4  LYMPHSABS 1.8  --   --   --   MONOABS 0.8  --   --   --   EOSABS 0.3  --   --   --   BASOSABS 0.0  --   --   --     Chemistries   Recent Labs Lab 03/09/17 1724 03/09/17 1850 03/10/17 0424 03/10/17 1007 03/11/17 0420  NA 138 139 139  --  137  K 3.8 3.8 3.4*  --  3.7  CL 103 101 103  --   102  CO2 27  --  28  --  28  GLUCOSE 112* 112* 104*  --  189*  BUN 13 16 10   --  9  CREATININE 0.91 1.00 0.93  --  1.00  CALCIUM 9.1  --  9.0  --  9.2  MG  --   --   --  1.9  --   AST 18  --   --  21 16  ALT 10*  --   --  11* 10*  ALKPHOS 58  --   --  60 56  BILITOT 0.2*  --   --  0.6 0.9   ------------------------------------------------------------------------------------------------------------------ estimated creatinine clearance is 50.9 mL/min (by C-G formula based on SCr of 1 mg/dL). ------------------------------------------------------------------------------------------------------------------  Recent Labs  03/10/17 0424  HGBA1C 6.6*   ------------------------------------------------------------------------------------------------------------------  Recent Labs  03/10/17 0424  CHOL 186  HDL 43  LDLCALC 114*  TRIG 147  CHOLHDL 4.3   ------------------------------------------------------------------------------------------------------------------  Recent Labs  03/10/17 0424  TSH 7.993*   ------------------------------------------------------------------------------------------------------------------ No results for input(s): VITAMINB12, FOLATE, FERRITIN, TIBC, IRON, RETICCTPCT in the last 72 hours.  Coagulation profile  Recent Labs Lab 03/09/17 1724  INR 1.04    No results for input(s): DDIMER in the last 72 hours.  Cardiac Enzymes No results for input(s): CKMB, TROPONINI, MYOGLOBIN in the last 168  hours.  Invalid input(s): CK ------------------------------------------------------------------------------------------------------------------ Invalid input(s): POCBNP   CBG:  Recent Labs Lab 03/10/17 1128 03/10/17 1648 03/10/17 2139 03/11/17 0629 03/11/17 1122  GLUCAP 120* 115* 102* 138* 140*       Studies: Ct Angio Head W Or Wo Contrast  Result Date: 03/10/2017 CLINICAL DATA:  Initial evaluation for acute dizziness, diplopia, left arm  weakness. EXAM: CT ANGIOGRAPHY HEAD AND NECK TECHNIQUE: Multidetector CT imaging of the head and neck was performed using the standard protocol during bolus administration of intravenous contrast. Multiplanar CT image reconstructions and MIPs were obtained to evaluate the vascular anatomy. Carotid stenosis measurements (when applicable) are obtained utilizing NASCET criteria, using the distal internal carotid diameter as the denominator. CONTRAST:  50 cc of Isovue 370. COMPARISON:  Prior CT from 03/09/2017. FINDINGS: CTA NECK FINDINGS Aortic arch: Visualized aortic arch of normal caliber. Scattered atheromatous plaque noted within the arch itself. No definite high-grade stenosis about the origin of the great vessels, although this is incompletely visualized on this exam. Visualized subclavian arteries patent without hemodynamically significant stenosis. Right carotid system: Right common carotid artery patent from its origin to the bifurcation. The atheromatous plaque at the right carotid bifurcation/ proximal right ICA with associated short-segment stenosis of up to 40% by NASCET criteria. Right ICA widely patent distally to the skullbase. Left carotid system: Left common carotid artery patent from its origin to the bifurcation. A centric calcified plaque about the left carotid bifurcation/proximal left ICA. Associated narrowing of approximately 35-40% by NASCET criteria. Left ICA widely patent distally to the skullbase. Vertebral arteries: Both of the vertebral arteries arise from the subclavian arteries. Mild to moderate smooth narrowing within the proximal V2 segments at the level of C5-6 related to uncovertebral disease. Vertebral arteries otherwise patent within the neck without stenosis, dissection, or occlusion. Skeleton: No acute osseus abnormality. No worrisome lytic or blastic osseous lesions. Moderate multilevel degenerative spondylolysis and facet arthrosis noted. Other neck: No acute soft tissue  abnormality within the neck. No adenopathy. Thyroid normal. Upper chest: Visualized upper chest within normal limits. Partially visualized lungs are largely clear. Review of the MIP images confirms the above findings CTA HEAD FINDINGS Anterior circulation: The scattered plaque present within the horizontal petrous right ICA with mild narrowing. Petrous left ICA widely patent. Smooth atheromatous plaque within the cavernous/supraclinoid ICAs with moderate diffuse narrowing, worse on the right. ICA termini widely patent. Left A1 segment patent. Right A1 segment hypoplastic/absent, which accounts for the diminutive right ICA is compared to the left. Anterior communicating artery normal. Anterior cerebral arteries patent to their distal aspects. M1 segments patent without stenosis or occlusion proximal right M1 segment is duplicated/fenestrated. MCA bifurcations normal. No proximal M2 occlusion. Distal MCA branches well opacified and symmetric. Posterior circulation: Vertebral arteries patent to the vertebrobasilar junction. Left vertebral artery slightly dominant. Posterior inferior cerebral arteries patent. Basilar artery widely patent to its distal aspect. Short fenestration of the proximal basilar artery noted. Superior cerebral arteries patent bilaterally. Both of the posterior cerebral arteries primarily supplied via the basilar artery. Bilateral severe P2 stenoses noted. PCAs are patent to their distal aspects. Venous sinuses: Patent. Anatomic variants: Fenestrated proximal right M1 segment and proximal basilar artery. Hypoplastic/absent right A1 segment with the ACA supplied via the left carotid artery system. Delayed phase: No pathologic enhancement. Review of the MIP images confirms the above findings IMPRESSION: CTA NECK IMPRESSION: 1. Atheromatous stenosis about the proximal ICAs bilaterally, with associated short-segment stenosis of up to 40% by NASCET criteria, right  slightly worse than left. 2. Patent  vertebral arteries within the neck. CTA HEAD IMPRESSION: 1. Negative CTA for large vessel occlusion. 2. Severe stenoses involving the P2 segments bilaterally. 3. Smooth atheromatous plaque throughout the carotid siphons with moderate diffuse narrowing, right worse than left. 4. Hypoplastic/absent right A1 segment, with the anterior cerebral arteries supplied via the left carotid artery system. Electronically Signed   By: Jeannine Boga M.D.   On: 03/10/2017 20:40   Dg Chest 2 View  Result Date: 03/09/2017 CLINICAL DATA:  Dizziness, vertigo, diplopia, left arm weakness and numbness. EXAM: CHEST  2 VIEW COMPARISON:  06/29/2016 CXR FINDINGS: Heart is top-normal in size. Aortic arch atherosclerosis. No aneurysm. Minimal atelectasis and/or scarring at the left lung base. No overt pulmonary edema, pneumothorax nor effusion. Status post aortic valvular replacement. Right atrial and right ventricular leads are noted with right-sided pacemaker apparatus in place. AC and glenohumeral joint osteoarthritis is identified bilaterally. Chronic left lower rib fractures. IMPRESSION: No active cardiopulmonary disease. Minimal left basilar atelectasis and/or scarring. Aortic atherosclerosis. Right-sided pacemaker apparatus in place with right atrial and right ventricular leads. Electronically Signed   By: Ashley Royalty M.D.   On: 03/09/2017 23:26   Dg Abd 1 View  Result Date: 03/09/2017 CLINICAL DATA:  Left-sided abdominal pain with diarrhea 4 days ago. EXAM: ABDOMEN - 1 VIEW COMPARISON:  None. FINDINGS: The bowel gas pattern is normal. Degenerate changes are seen along the dorsal spine. Partially visualized aortic valvular replacement and right ventricular lead from a pacemaker apparatus. No radio-opaque calculi or other significant radiographic abnormality are seen. 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 Vascular clips project over the right hip. IMPRESSION: Unremarkable bowel gas pattern. Degenerative  changes are noted along the dorsal spine, pubic symphysis and both hip joints. Electronically Signed   By: Ashley Royalty M.D.   On: 03/09/2017 23:27   Ct Head Wo Contrast  Result Date: 03/09/2017 CLINICAL DATA:  Persistent dizziness beginning at 7:30 a.m. today. Resolved diplopia and LEFT arm weakness with mild residual LEFT arm numbness. History of diabetes, hypertension and hypercholesterolemia. EXAM: CT HEAD WITHOUT CONTRAST TECHNIQUE: Contiguous axial images were obtained from the base of the skull through the vertex without intravenous contrast. COMPARISON:  CT HEAD February 24, 2016 FINDINGS: BRAIN: No intraparenchymal hemorrhage, mass effect nor midline shift. The ventricles and sulci are normal for age. Patchy supratentorial white matter hypodensities less than expected for patient's age, though non-specific are most compatible with chronic small vessel ischemic disease. No acute large vascular territory infarcts. No abnormal extra-axial fluid collections. Basal cisterns are patent. VASCULAR: Mild to moderate calcific atherosclerosis of the carotid siphons. SKULL: No skull fracture. Moderate temporomandibular osteoarthrosis. No significant scalp soft tissue swelling. SINUSES/ORBITS: Mild paranasal sinus mucosal thickening. Mastoid air cells are well aerated. The included ocular globes and orbital contents are non-suspicious. OTHER: Patient is edentulous. IMPRESSION: Normal CT HEAD for age. Electronically Signed   By: Elon Alas M.D.   On: 03/09/2017 17:56   Ct Angio Neck W Or Wo Contrast  Result Date: 03/10/2017 CLINICAL DATA:  Initial evaluation for acute dizziness, diplopia, left arm weakness. EXAM: CT ANGIOGRAPHY HEAD AND NECK TECHNIQUE: Multidetector CT imaging of the head and neck was performed using the standard protocol during bolus administration of intravenous contrast. Multiplanar CT image reconstructions and MIPs were obtained to evaluate the vascular anatomy. Carotid stenosis measurements  (when applicable) are obtained utilizing NASCET criteria, using the distal internal carotid diameter as the denominator. CONTRAST:  50  cc of Isovue 370. COMPARISON:  Prior CT from 03/09/2017. FINDINGS: CTA NECK FINDINGS Aortic arch: Visualized aortic arch of normal caliber. Scattered atheromatous plaque noted within the arch itself. No definite high-grade stenosis about the origin of the great vessels, although this is incompletely visualized on this exam. Visualized subclavian arteries patent without hemodynamically significant stenosis. Right carotid system: Right common carotid artery patent from its origin to the bifurcation. The atheromatous plaque at the right carotid bifurcation/ proximal right ICA with associated short-segment stenosis of up to 40% by NASCET criteria. Right ICA widely patent distally to the skullbase. Left carotid system: Left common carotid artery patent from its origin to the bifurcation. A centric calcified plaque about the left carotid bifurcation/proximal left ICA. Associated narrowing of approximately 35-40% by NASCET criteria. Left ICA widely patent distally to the skullbase. Vertebral arteries: Both of the vertebral arteries arise from the subclavian arteries. Mild to moderate smooth narrowing within the proximal V2 segments at the level of C5-6 related to uncovertebral disease. Vertebral arteries otherwise patent within the neck without stenosis, dissection, or occlusion. Skeleton: No acute osseus abnormality. No worrisome lytic or blastic osseous lesions. Moderate multilevel degenerative spondylolysis and facet arthrosis noted. Other neck: No acute soft tissue abnormality within the neck. No adenopathy. Thyroid normal. Upper chest: Visualized upper chest within normal limits. Partially visualized lungs are largely clear. Review of the MIP images confirms the above findings CTA HEAD FINDINGS Anterior circulation: The scattered plaque present within the horizontal petrous right ICA  with mild narrowing. Petrous left ICA widely patent. Smooth atheromatous plaque within the cavernous/supraclinoid ICAs with moderate diffuse narrowing, worse on the right. ICA termini widely patent. Left A1 segment patent. Right A1 segment hypoplastic/absent, which accounts for the diminutive right ICA is compared to the left. Anterior communicating artery normal. Anterior cerebral arteries patent to their distal aspects. M1 segments patent without stenosis or occlusion proximal right M1 segment is duplicated/fenestrated. MCA bifurcations normal. No proximal M2 occlusion. Distal MCA branches well opacified and symmetric. Posterior circulation: Vertebral arteries patent to the vertebrobasilar junction. Left vertebral artery slightly dominant. Posterior inferior cerebral arteries patent. Basilar artery widely patent to its distal aspect. Short fenestration of the proximal basilar artery noted. Superior cerebral arteries patent bilaterally. Both of the posterior cerebral arteries primarily supplied via the basilar artery. Bilateral severe P2 stenoses noted. PCAs are patent to their distal aspects. Venous sinuses: Patent. Anatomic variants: Fenestrated proximal right M1 segment and proximal basilar artery. Hypoplastic/absent right A1 segment with the ACA supplied via the left carotid artery system. Delayed phase: No pathologic enhancement. Review of the MIP images confirms the above findings IMPRESSION: CTA NECK IMPRESSION: 1. Atheromatous stenosis about the proximal ICAs bilaterally, with associated short-segment stenosis of up to 40% by NASCET criteria, right slightly worse than left. 2. Patent vertebral arteries within the neck. CTA HEAD IMPRESSION: 1. Negative CTA for large vessel occlusion. 2. Severe stenoses involving the P2 segments bilaterally. 3. Smooth atheromatous plaque throughout the carotid siphons with moderate diffuse narrowing, right worse than left. 4. Hypoplastic/absent right A1 segment, with the  anterior cerebral arteries supplied via the left carotid artery system. Electronically Signed   By: Jeannine Boga M.D.   On: 03/10/2017 20:40      Lab Results  Component Value Date   HGBA1C 6.6 (H) 03/10/2017   HGBA1C 6.5 (H) 08/25/2015   Lab Results  Component Value Date   LDLCALC 114 (H) 03/10/2017   CREATININE 1.00 03/11/2017       Scheduled  Meds: . aspirin  325 mg Oral Daily  . atorvastatin  80 mg Oral QPM  . buPROPion  300 mg Oral Daily  . clonazePAM  1 mg Oral QHS  . clopidogrel  75 mg Oral Daily  . docusate sodium  100 mg Oral BID  . enoxaparin (LOVENOX) injection  40 mg Subcutaneous QHS  . insulin aspart  0-15 Units Subcutaneous TID WC  . insulin aspart  0-5 Units Subcutaneous QHS  . levothyroxine  100 mcg Oral QAC breakfast  . multivitamin with minerals  1 tablet Oral Q1200  . pantoprazole  40 mg Oral Daily  . PARoxetine  30 mg Oral Daily   Continuous Infusions:   LOS: 0 days    Time spent: >30 MINS    Reyne Dumas  [facility] Pager [phone]. If 7PM-7AM, please contact night-coverage at [email or web address], password [facility] 03/11/2017, 12:39 PM  LOS: 0 days

## 2017-03-11 NOTE — Progress Notes (Signed)
qPhysical Therapy Treatment Patient Details Name: Catherine [name] MRN: [id number] DOB: 1941-11-12 Today's Date: 03/11/2017    History of Present Illness 76 y.o.femalewith medical history significant of Hypothyroidism, AS s/p TAVR, RA/FM, HTN, DM2, dCHF, CAD, h/o breast cancer in 1980 who presents with dizziness when looking left and L arm numbness. Pt admit for possible TIA. Pas surgical h/o pacemaker implant (2016).     PT Comments    Patient continues with positive signs of R BPPV and some concern for repositioning into horizontal canal with horizontal nystagmus noted.  Feel she needs capable 24 hour assist and likely best to initiate with HHPT rather than outpatient due to mobility deficits, weakness and vertigo.  Discussed possible rehab stay, but pt unwilling stating her spouse and daughter can assist (though daughter in pain and seeking medical attention in ED this afternoon).  Will follow up next day for continued treatment and assessment of safety for d/c home versus SNF.  Follow Up Recommendations  Supervision/Assistance - 24 hour;Home health PT (vestibular rehab)     Equipment Recommendations       Recommendations for Other Services       Precautions / Restrictions Precautions Precautions: Fall    Mobility  Bed Mobility Overal bed mobility: Needs Assistance Bed Mobility: Sidelying to Sit;Rolling Rolling: Mod assist Sidelying to sit: Mod assist   Sit to supine: Mod assist;+2 for physical assistance   General bed mobility comments: assist for positioning for [facility], then for side to sit after repositioning  Transfers   Equipment used: Rolling walker (2 wheeled) Transfers: Sit to/from [facility] Sit to Stand: Mod assist Stand pivot transfers: Mod assist       General transfer comment: assist up to [facility] with mod A for safety/lifting, cues for hand placement  Ambulation/Gait Ambulation/Gait assistance: Min assist Ambulation Distance (Feet):  80 Feet Assistive device: Rolling walker (2 wheeled) Gait Pattern/deviations: Step-through pattern;Decreased stride length;Shuffle;Trunk flexed     General Gait Details: initially far from walker, cues for proximity improved rest of the session, walked prior to CRT, then again after around bed   Stairs            Wheelchair Mobility    Modified Rankin (Stroke Patients Only) Modified Rankin (Stroke Patients Only) Pre-Morbid Rankin Score: No symptoms Modified Rankin: Moderately severe disability     Balance Overall balance assessment: Needs assistance   Sitting balance-Leahy Scale: Fair     Standing balance support: Bilateral upper extremity supported Standing balance-Leahy Scale: Poor Standing balance comment: reliant on physical assist; UE support                            Cognition Arousal/Alertness: Awake/alert Behavior During Therapy: WFL for tasks assessed/performed Overall Cognitive Status: Within Functional Limits for tasks assessed                               Problem Solving: Slow processing        Exercises      General Comments General comments (skin integrity, edema, etc.): assist for repositioning treatment, initially tested for L due to pt reports on L, noted no nystagmus, then rolled head R and noted right rotary nystagmus, performed Eply canalith repositionng and noted L horizontal nystagmus with head roll L       Pertinent Vitals/Pain Pain Assessment: No/denies pain    Home Living  Prior Function            PT Goals (current goals can now be found in the care plan section) Progress towards PT goals: Progressing toward goals    Frequency    Min 3X/week      PT Plan Discharge plan needs to be updated    Co-evaluation             End of Session Equipment Utilized During Treatment: Gait belt Activity Tolerance: Patient tolerated treatment well Patient left: in  chair;with call bell/phone within reach;with chair alarm set;with family/visitor present   PT Visit Diagnosis: Other abnormalities of gait and mobility (R26.89);BPPV BPPV - Right/Left : Right     Time: 2174-7159 PT Time Calculation (min) (ACUTE ONLY): 37 min  Charges:  $Gait Training: 8-22 mins $Therapeutic Activity: 8-22 mins $Canalith Rep Proc: 8-22 mins                    G CodesMagda [name], [place] [phone] 03/11/2017    Catherine [name] 03/11/2017, 5:12 PM

## 2017-03-11 NOTE — Progress Notes (Signed)
  Echocardiogram 2D Echocardiogram has been performed.  Catherine [name] [name] 03/11/2017, 1:28 PM

## 2017-03-12 ENCOUNTER — Observation Stay (HOSPITAL_COMMUNITY): Payer: Medicare Other

## 2017-03-12 DIAGNOSIS — H8113 Benign paroxysmal vertigo, bilateral: Secondary | ICD-10-CM | POA: Diagnosis not present

## 2017-03-12 DIAGNOSIS — I5032 Chronic diastolic (congestive) heart failure: Secondary | ICD-10-CM | POA: Diagnosis not present

## 2017-03-12 DIAGNOSIS — I442 Atrioventricular block, complete: Secondary | ICD-10-CM | POA: Diagnosis not present

## 2017-03-12 LAB — GLUCOSE, CAPILLARY: Glucose-Capillary: 131 mg/dL — ABNORMAL HIGH (ref 65–99)

## 2017-03-12 MED ORDER — ASPIRIN 325 MG PO TABS: 325.0000 mg | ORAL_TABLET | Freq: Every day | ORAL | 1 refills | Status: DC

## 2017-03-12 MED ORDER — ATORVASTATIN CALCIUM 80 MG PO TABS: 80.0000 mg | ORAL_TABLET | Freq: Every evening | ORAL | 1 refills | Status: DC

## 2017-03-12 MED ORDER — CLOPIDOGREL BISULFATE 75 MG PO TABS: 75.0000 mg | ORAL_TABLET | Freq: Every day | ORAL | 1 refills | Status: DC

## 2017-03-12 NOTE — Discharge Summary (Signed)
Physician Discharge Summary  Catherine [name] MRN: [id number] DOB/AGE: Feb 24, 1941 76 y.o.  PCP: Jilda Panda, MD   Admit date: 03/09/2017 Discharge date: 03/12/2017  Discharge Diagnoses:    Active Problems:   MDD (major depressive disorder) (HCC)   Aortic valve stenosis, critical   Type II diabetes mellitus (HCC)   Essential hypertension   Chronic diastolic congestive heart failure (HCC)   Rheumatoid arthritis (HCC)   Hypothyroidism   Fibromyalgia   S/P TAVR (transcatheter aortic valve replacement)   Complete heart block (HCC)   IBS (irritable bowel syndrome)   TIA (transient ischemic attack)   CVA (cerebral vascular accident) ([place])   Abdominal pain   BPPV (benign paroxysmal positional vertigo), bilateral    Follow-up recommendations Follow-up with PCP in 3-5 days , including all  additional recommended appointments as below Follow-up CBC, CMP in 3-5 days Patient would need follow-up with neurology Dr. Erlinda Hong      Current Discharge Medication List    START taking these medications   Details  aspirin 81  MG tablet Take 1 tablet (81 mg total) by mouth daily. Qty: 30 tablet, Refills: 1    clopidogrel (PLAVIX) 75 MG tablet Take 1 tablet (75 mg total) by mouth daily. Qty: 30 tablet, Refills: 1      CONTINUE these medications which have CHANGED   Details  atorvastatin (LIPITOR) 80 MG tablet Take 1 tablet (80 mg total) by mouth every evening. Qty: 30 tablet, Refills: 1      CONTINUE these medications which have NOT CHANGED   Details  acetaminophen (TYLENOL) 325 MG tablet Take 650 mg by mouth every 6 (six) hours as needed for moderate pain.    buPROPion (WELLBUTRIN XL) 300 MG 24 hr tablet Take 1 tablet (300 mg total) by mouth daily. Qty: 31 tablet, Refills: 2   Associated Diagnoses: Moderate episode of recurrent major depressive disorder (HCC)    clonazePAM (KLONOPIN) 1 MG tablet Take 1 tablet (1 mg total) by mouth at bedtime. Qty: 31 tablet, Refills: 2   Associated  Diagnoses: Moderate episode of recurrent major depressive disorder (HCC)    docusate sodium (COLACE) 100 MG capsule Take 100 mg by mouth 2 (two) times daily.    KLOR-CON M10 10 MEQ tablet TAKE 1 TABLET BY MOUTH EVERY DAY Qty: 30 tablet, Refills: 9    levothyroxine (SYNTHROID, LEVOTHROID) 100 MCG tablet Take 100 mcg by mouth daily before breakfast.     Linagliptin-Metformin HCl (JENTADUETO) 2.5-500 MG TABS Take 0.5 tablets by mouth daily with breakfast.     lisinopril (PRINIVIL,ZESTRIL) 10 MG tablet TAKE 1 TABLET (10 MG TOTAL) BY MOUTH DAILY. Qty: 30 tablet, Refills: 11    metoprolol tartrate (LOPRESSOR) 25 MG tablet Take 25 mg by mouth daily.     Multiple Vitamin (MULTIVITAMIN WITH MINERALS) TABS tablet Take 1 tablet by mouth daily at 12 noon.     pantoprazole (PROTONIX) 40 MG tablet TAKE 1 TABLET BY MOUTH EVERY DAY Qty: 30 tablet, Refills: 11    PARoxetine (PAXIL) 30 MG tablet Take 1 tablet (30 mg total) by mouth daily. Qty: 31 tablet, Refills: 2   Associated Diagnoses: Moderate episode of recurrent major depressive disorder (HCC)    tamsulosin (FLOMAX) 0.4 MG CAPS capsule Take 0.4 mg by mouth daily after breakfast.    traZODone (DESYREL) 50 MG tablet Take 1 tablet (50 mg total) by mouth at bedtime as needed for sleep. Qty: 31 tablet, Refills: 2   Associated Diagnoses: Moderate episode of recurrent major depressive disorder (  [place])      STOP taking these medications     aspirin EC 81 MG tablet      meloxicam (MOBIC) 7.5 MG tablet          Discharge Condition: Stable   Discharge Instructions Get Medicines reviewed and adjusted: Please take all your medications with you for your next visit with your Primary MD  Please request your Primary MD to go over all hospital tests and procedure/radiological results at the follow up, please ask your Primary MD to get all Hospital records sent to his/her office.  If you experience worsening of your admission symptoms, develop  shortness of breath, life threatening emergency, suicidal or homicidal thoughts you must seek medical attention immediately by calling 911 or calling your MD immediately if symptoms less severe.  You must read complete instructions/literature along with all the possible adverse reactions/side effects for all the Medicines you take and that have been prescribed to you. Take any new Medicines after you have completely understood and accpet all the possible adverse reactions/side effects.   Do not drive when taking Pain medications.   Do not take more than prescribed Pain, Sleep and Anxiety Medications  Special Instructions: If you have smoked or chewed Tobacco in the last 2 yrs please stop smoking, stop any regular Alcohol and or any Recreational drug use.  Wear Seat belts while driving.  Please note  You were cared for by a hospitalist during your hospital stay. Once you are discharged, your primary care physician will handle any further medical issues. Please note that NO REFILLS for any discharge medications will be authorized once you are discharged, as it is imperative that you return to your primary care physician (or establish a relationship with a primary care physician if you do not have one) for your aftercare needs so that they can reassess your need for medications and monitor your lab values.     Allergies  Allergen Reactions  . Sulfa Antibiotics Hives  . Latex Rash      Disposition: Home with home health   Consults:  Neurology    Significant Diagnostic Studies:  Ct Angio Head W Or Wo Contrast  Result Date: 03/10/2017 CLINICAL DATA:  Initial evaluation for acute dizziness, diplopia, left arm weakness. EXAM: CT ANGIOGRAPHY HEAD AND NECK TECHNIQUE: Multidetector CT imaging of the head and neck was performed using the standard protocol during bolus administration of intravenous contrast. Multiplanar CT image reconstructions and MIPs were obtained to evaluate the  vascular anatomy. Carotid stenosis measurements (when applicable) are obtained utilizing NASCET criteria, using the distal internal carotid diameter as the denominator. CONTRAST:  50 cc of Isovue 370. COMPARISON:  Prior CT from 03/09/2017. FINDINGS: CTA NECK FINDINGS Aortic arch: Visualized aortic arch of normal caliber. Scattered atheromatous plaque noted within the arch itself. No definite high-grade stenosis about the origin of the great vessels, although this is incompletely visualized on this exam. Visualized subclavian arteries patent without hemodynamically significant stenosis. Right carotid system: Right common carotid artery patent from its origin to the bifurcation. The atheromatous plaque at the right carotid bifurcation/ proximal right ICA with associated short-segment stenosis of up to 40% by NASCET criteria. Right ICA widely patent distally to the skullbase. Left carotid system: Left common carotid artery patent from its origin to the bifurcation. A centric calcified plaque about the left carotid bifurcation/proximal left ICA. Associated narrowing of approximately 35-40% by NASCET criteria. Left ICA widely patent distally to the skullbase. Vertebral arteries: Both of the vertebral  arteries arise from the subclavian arteries. Mild to moderate smooth narrowing within the proximal V2 segments at the level of C5-6 related to uncovertebral disease. Vertebral arteries otherwise patent within the neck without stenosis, dissection, or occlusion. Skeleton: No acute osseus abnormality. No worrisome lytic or blastic osseous lesions. Moderate multilevel degenerative spondylolysis and facet arthrosis noted. Other neck: No acute soft tissue abnormality within the neck. No adenopathy. Thyroid normal. Upper chest: Visualized upper chest within normal limits. Partially visualized lungs are largely clear. Review of the MIP images confirms the above findings CTA HEAD FINDINGS Anterior circulation: The scattered plaque  present within the horizontal petrous right ICA with mild narrowing. Petrous left ICA widely patent. Smooth atheromatous plaque within the cavernous/supraclinoid ICAs with moderate diffuse narrowing, worse on the right. ICA termini widely patent. Left A1 segment patent. Right A1 segment hypoplastic/absent, which accounts for the diminutive right ICA is compared to the left. Anterior communicating artery normal. Anterior cerebral arteries patent to their distal aspects. M1 segments patent without stenosis or occlusion proximal right M1 segment is duplicated/fenestrated. MCA bifurcations normal. No proximal M2 occlusion. Distal MCA branches well opacified and symmetric. Posterior circulation: Vertebral arteries patent to the vertebrobasilar junction. Left vertebral artery slightly dominant. Posterior inferior cerebral arteries patent. Basilar artery widely patent to its distal aspect. Short fenestration of the proximal basilar artery noted. Superior cerebral arteries patent bilaterally. Both of the posterior cerebral arteries primarily supplied via the basilar artery. Bilateral severe P2 stenoses noted. PCAs are patent to their distal aspects. Venous sinuses: Patent. Anatomic variants: Fenestrated proximal right M1 segment and proximal basilar artery. Hypoplastic/absent right A1 segment with the ACA supplied via the left carotid artery system. Delayed phase: No pathologic enhancement. Review of the MIP images confirms the above findings IMPRESSION: CTA NECK IMPRESSION: 1. Atheromatous stenosis about the proximal ICAs bilaterally, with associated short-segment stenosis of up to 40% by NASCET criteria, right slightly worse than left. 2. Patent vertebral arteries within the neck. CTA HEAD IMPRESSION: 1. Negative CTA for large vessel occlusion. 2. Severe stenoses involving the P2 segments bilaterally. 3. Smooth atheromatous plaque throughout the carotid siphons with moderate diffuse narrowing, right worse than left. 4.  Hypoplastic/absent right A1 segment, with the anterior cerebral arteries supplied via the left carotid artery system. Electronically Signed   By: Jeannine Boga M.D.   On: 03/10/2017 20:40   Dg Chest 2 View  Result Date: 03/09/2017 CLINICAL DATA:  Dizziness, vertigo, diplopia, left arm weakness and numbness. EXAM: CHEST  2 VIEW COMPARISON:  06/29/2016 CXR FINDINGS: Heart is top-normal in size. Aortic arch atherosclerosis. No aneurysm. Minimal atelectasis and/or scarring at the left lung base. No overt pulmonary edema, pneumothorax nor effusion. Status post aortic valvular replacement. Right atrial and right ventricular leads are noted with right-sided pacemaker apparatus in place. AC and glenohumeral joint osteoarthritis is identified bilaterally. Chronic left lower rib fractures. IMPRESSION: No active cardiopulmonary disease. Minimal left basilar atelectasis and/or scarring. Aortic atherosclerosis. Right-sided pacemaker apparatus in place with right atrial and right ventricular leads. Electronically Signed   By: Ashley Royalty M.D.   On: 03/09/2017 23:26   Dg Abd 1 View  Result Date: 03/09/2017 CLINICAL DATA:  Left-sided abdominal pain with diarrhea 4 days ago. EXAM: ABDOMEN - 1 VIEW COMPARISON:  None. FINDINGS: The bowel gas pattern is normal. Degenerate changes are seen along the dorsal spine. Partially visualized aortic valvular replacement and right ventricular lead from a pacemaker apparatus. No radio-opaque calculi or other significant radiographic abnormality are seen. Osteoarthritis of  the pubic symphysis and both hips. Vascular clips project over the right hip. IMPRESSION: Unremarkable bowel gas pattern. Degenerative changes are noted along the dorsal spine, pubic symphysis and both hip joints. Electronically Signed   By: Ashley Royalty M.D.   On: 03/09/2017 23:27   Ct Head Wo Contrast  Result Date: 03/12/2017 CLINICAL DATA:  Left-sided headache for 24 hours EXAM: CT HEAD WITHOUT CONTRAST  TECHNIQUE: Contiguous axial images were obtained from the base of the skull through the vertex without intravenous contrast. COMPARISON:  CTA head neck 03/10/2017 FINDINGS: Brain: No mass lesion, intraparenchymal hemorrhage or extra-axial collection. No evidence of acute cortical infarct. There is mild periventricular hypoattenuation compatible with chronic microvascular disease. Vascular: No hyperdense vessel or unexpected calcification. Skull: Normal visualized skull base, calvarium and extracranial soft tissues. Sinuses/Orbits: No sinus fluid levels or advanced mucosal thickening. No mastoid effusion. Normal orbits. IMPRESSION: Normal head CT for age. Electronically Signed   By: Ulyses Jarred M.D.   On: 03/12/2017 06:08   Ct Head Wo Contrast  Result Date: 03/09/2017 CLINICAL DATA:  Persistent dizziness beginning at 7:30 a.m. today. Resolved diplopia and LEFT arm weakness with mild residual LEFT arm numbness. History of diabetes, hypertension and hypercholesterolemia. EXAM: CT HEAD WITHOUT CONTRAST TECHNIQUE: Contiguous axial images were obtained from the base of the skull through the vertex without intravenous contrast. COMPARISON:  CT HEAD February 24, 2016 FINDINGS: BRAIN: No intraparenchymal hemorrhage, mass effect nor midline shift. The ventricles and sulci are normal for age. Patchy supratentorial white matter hypodensities less than expected for patient's age, though non-specific are most compatible with chronic small vessel ischemic disease. No acute large vascular territory infarcts. No abnormal extra-axial fluid collections. Basal cisterns are patent. VASCULAR: Mild to moderate calcific atherosclerosis of the carotid siphons. SKULL: No skull fracture. Moderate temporomandibular osteoarthrosis. No significant scalp soft tissue swelling. SINUSES/ORBITS: Mild paranasal sinus mucosal thickening. Mastoid air cells are well aerated. The included ocular globes and orbital contents are non-suspicious. OTHER:  Patient is edentulous. IMPRESSION: Normal CT HEAD for age. Electronically Signed   By: Elon Alas M.D.   On: 03/09/2017 17:56   Ct Angio Neck W Or Wo Contrast  Result Date: 03/10/2017 CLINICAL DATA:  Initial evaluation for acute dizziness, diplopia, left arm weakness. EXAM: CT ANGIOGRAPHY HEAD AND NECK TECHNIQUE: Multidetector CT imaging of the head and neck was performed using the standard protocol during bolus administration of intravenous contrast. Multiplanar CT image reconstructions and MIPs were obtained to evaluate the vascular anatomy. Carotid stenosis measurements (when applicable) are obtained utilizing NASCET criteria, using the distal internal carotid diameter as the denominator. CONTRAST:  50 cc of Isovue 370. COMPARISON:  Prior CT from 03/09/2017. FINDINGS: CTA NECK FINDINGS Aortic arch: Visualized aortic arch of normal caliber. Scattered atheromatous plaque noted within the arch itself. No definite high-grade stenosis about the origin of the great vessels, although this is incompletely visualized on this exam. Visualized subclavian arteries patent without hemodynamically significant stenosis. Right carotid system: Right common carotid artery patent from its origin to the bifurcation. The atheromatous plaque at the right carotid bifurcation/ proximal right ICA with associated short-segment stenosis of up to 40% by NASCET criteria. Right ICA widely patent distally to the skullbase. Left carotid system: Left common carotid artery patent from its origin to the bifurcation. A centric calcified plaque about the left carotid bifurcation/proximal left ICA. Associated narrowing of approximately 35-40% by NASCET criteria. Left ICA widely patent distally to the skullbase. Vertebral arteries: Both of the vertebral arteries arise  from the subclavian arteries. Mild to moderate smooth narrowing within the proximal V2 segments at the level of C5-6 related to uncovertebral disease. Vertebral arteries  otherwise patent within the neck without stenosis, dissection, or occlusion. Skeleton: No acute osseus abnormality. No worrisome lytic or blastic osseous lesions. Moderate multilevel degenerative spondylolysis and facet arthrosis noted. Other neck: No acute soft tissue abnormality within the neck. No adenopathy. Thyroid normal. Upper chest: Visualized upper chest within normal limits. Partially visualized lungs are largely clear. Review of the MIP images confirms the above findings CTA HEAD FINDINGS Anterior circulation: The scattered plaque present within the horizontal petrous right ICA with mild narrowing. Petrous left ICA widely patent. Smooth atheromatous plaque within the cavernous/supraclinoid ICAs with moderate diffuse narrowing, worse on the right. ICA termini widely patent. Left A1 segment patent. Right A1 segment hypoplastic/absent, which accounts for the diminutive right ICA is compared to the left. Anterior communicating artery normal. Anterior cerebral arteries patent to their distal aspects. M1 segments patent without stenosis or occlusion proximal right M1 segment is duplicated/fenestrated. MCA bifurcations normal. No proximal M2 occlusion. Distal MCA branches well opacified and symmetric. Posterior circulation: Vertebral arteries patent to the vertebrobasilar junction. Left vertebral artery slightly dominant. Posterior inferior cerebral arteries patent. Basilar artery widely patent to its distal aspect. Short fenestration of the proximal basilar artery noted. Superior cerebral arteries patent bilaterally. Both of the posterior cerebral arteries primarily supplied via the basilar artery. Bilateral severe P2 stenoses noted. PCAs are patent to their distal aspects. Venous sinuses: Patent. Anatomic variants: Fenestrated proximal right M1 segment and proximal basilar artery. Hypoplastic/absent right A1 segment with the ACA supplied via the left carotid artery system. Delayed phase: No pathologic  enhancement. Review of the MIP images confirms the above findings IMPRESSION: CTA NECK IMPRESSION: 1. Atheromatous stenosis about the proximal ICAs bilaterally, with associated short-segment stenosis of up to 40% by NASCET criteria, right slightly worse than left. 2. Patent vertebral arteries within the neck. CTA HEAD IMPRESSION: 1. Negative CTA for large vessel occlusion. 2. Severe stenoses involving the P2 segments bilaterally. 3. Smooth atheromatous plaque throughout the carotid siphons with moderate diffuse narrowing, right worse than left. 4. Hypoplastic/absent right A1 segment, with the anterior cerebral arteries supplied via the left carotid artery system. Electronically Signed   By: Jeannine Boga M.D.   On: 03/10/2017 20:40    echocardiogram  LV EF: 60% -   65%  ------------------------------------------------------------------- Indications:      TIA 435.9.  ------------------------------------------------------------------- Study Conclusions  - Left ventricle: The cavity size was normal. There was moderate   concentric hypertrophy. Systolic function was normal. The   estimated ejection fraction was in the range of 60% to 65%. Wall   motion was normal; there were no regional wall motion   abnormalities. Doppler parameters are consistent with abnormal   left ventricular relaxation (grade 1 diastolic dysfunction).   Doppler parameters are consistent with elevated ventricular   end-diastolic filling pressure. - Aortic valve: Transvalvular velocity was within the normal range.   There was no stenosis. There was no regurgitation. Mean gradient   (S): 4 mm Hg. Peak gradient (S): 8 mm Hg. Valve area (VTI): 2.64   cm^2. Valve area (Vmax): 2.48 cm^2. Valve area (Vmean): 3.06   cm^2. - Aortic root: The aortic root was normal in size. - Mitral valve: There was mild regurgitation. - Left atrium: The atrium was mildly dilated. - Right ventricle: Pacer wire or catheter noted in right  ventricle. - Right atrium: Pacer wire  or catheter noted in right atrium. - Tricuspid valve: There was trivial regurgitation. - Pulmonic valve: There was trivial regurgitation. - Inferior vena cava: The vessel was normal in size. - Pericardium, extracardiac: There was no pericardial effusion.  Impressions:  - LVEF 60-65%.   A TAVR bioprosthesis sits well in the aortic position. There are   normal transaortic gradients and unchaged from prior on   10/09/2016.   There is no central AI or paravalvular leak.    Filed Weights   03/09/17 2100  Weight: 88.9 kg (196 lb)     Microbiology: No results found for this or any previous visit (from the past 240 hour(s)).     Blood Culture    Component Value Date/Time   SDES URINE, CATHETERIZED 09/06/2015 1515   SPECREQUEST NONE 09/06/2015 1515   CULT NO GROWTH 1 DAY 09/06/2015 1515   REPTSTATUS 09/07/2015 FINAL 09/06/2015 1515      Labs: Results for orders placed or performed during the hospital encounter of 03/09/17 (from the past 48 hour(s))  Hepatic function panel     Status: Abnormal   Collection Time: 03/10/17 10:07 AM  Result Value Ref Range   Total Protein 6.3 (L) 6.5 - 8.1 g/dL   Albumin 3.1 (L) 3.5 - 5.0 g/dL   AST 21 15 - 41 U/L   ALT 11 (L) 14 - 54 U/L   Alkaline Phosphatase 60 38 - 126 U/L   Total Bilirubin 0.6 0.3 - 1.2 mg/dL   Bilirubin, Direct 0.1 0.1 - 0.5 mg/dL   Indirect Bilirubin 0.5 0.3 - 0.9 mg/dL  Magnesium     Status: None   Collection Time: 03/10/17 10:07 AM  Result Value Ref Range   Magnesium 1.9 1.7 - 2.4 mg/dL  Lipase, blood     Status: None   Collection Time: 03/10/17 10:07 AM  Result Value Ref Range   Lipase 18 11 - 51 U/L  T4, free     Status: None   Collection Time: 03/10/17 10:07 AM  Result Value Ref Range   Free T4 0.95 0.61 - 1.12 ng/dL    Comment: (NOTE) Biotin ingestion may interfere with free T4 tests. If the results are inconsistent with the TSH level, previous test results, or  the clinical presentation, then consider biotin interference. If needed, order repeat testing after stopping biotin.   Glucose, capillary     Status: Abnormal   Collection Time: 03/10/17 11:28 AM  Result Value Ref Range   Glucose-Capillary 120 (H) 65 - 99 mg/dL  Glucose, capillary     Status: Abnormal   Collection Time: 03/10/17  4:48 PM  Result Value Ref Range   Glucose-Capillary 115 (H) 65 - 99 mg/dL  Glucose, capillary     Status: Abnormal   Collection Time: 03/10/17  9:39 PM  Result Value Ref Range   Glucose-Capillary 102 (H) 65 - 99 mg/dL   Comment 1 Notify RN    Comment 2 Document in Chart   Comprehensive metabolic panel     Status: Abnormal   Collection Time: 03/11/17  4:20 AM  Result Value Ref Range   Sodium 137 135 - 145 mmol/L   Potassium 3.7 3.5 - 5.1 mmol/L   Chloride 102 101 - 111 mmol/L   CO2 28 22 - 32 mmol/L   Glucose, Bld 189 (H) 65 - 99 mg/dL   BUN 9 6 - 20 mg/dL   Creatinine, Ser 1.00 0.44 - 1.00 mg/dL   Calcium 9.2 8.9 - 10.3 mg/dL  Total Protein 6.0 (L) 6.5 - 8.1 g/dL   Albumin 3.2 (L) 3.5 - 5.0 g/dL   AST 16 15 - 41 U/L   ALT 10 (L) 14 - 54 U/L   Alkaline Phosphatase 56 38 - 126 U/L   Total Bilirubin 0.9 0.3 - 1.2 mg/dL   GFR calc non Af Amer 53 (L) >60 mL/min   GFR calc Af Amer >60 >60 mL/min    Comment: (NOTE) The eGFR has been calculated using the CKD EPI equation. This calculation has not been validated in all clinical situations. eGFR's persistently <60 mL/min signify possible Chronic Kidney Disease.    Anion gap 7 5 - 15  CBC     Status: Abnormal   Collection Time: 03/11/17  4:20 AM  Result Value Ref Range   WBC 8.1 4.0 - 10.5 K/uL   RBC 3.95 3.87 - 5.11 MIL/uL   Hemoglobin 10.7 (L) 12.0 - 15.0 g/dL   HCT 34.6 (L) 36.0 - 46.0 %   MCV 87.6 78.0 - 100.0 fL   MCH 27.1 26.0 - 34.0 pg   MCHC 30.9 30.0 - 36.0 g/dL   RDW 14.4 11.5 - 15.5 %   Platelets 206 150 - 400 K/uL  Glucose, capillary     Status: Abnormal   Collection Time: 03/11/17   6:29 AM  Result Value Ref Range   Glucose-Capillary 138 (H) 65 - 99 mg/dL   Comment 1 Notify RN    Comment 2 Document in Chart   Glucose, capillary     Status: Abnormal   Collection Time: 03/11/17 11:22 AM  Result Value Ref Range   Glucose-Capillary 140 (H) 65 - 99 mg/dL  Lipase, blood     Status: None   Collection Time: 03/11/17  1:00 PM  Result Value Ref Range   Lipase 19 11 - 51 U/L  Glucose, capillary     Status: Abnormal   Collection Time: 03/11/17  2:12 PM  Result Value Ref Range   Glucose-Capillary 61 (L) 65 - 99 mg/dL  Glucose, capillary     Status: Abnormal   Collection Time: 03/11/17  2:42 PM  Result Value Ref Range   Glucose-Capillary 164 (H) 65 - 99 mg/dL  Glucose, capillary     Status: Abnormal   Collection Time: 03/11/17  5:03 PM  Result Value Ref Range   Glucose-Capillary 137 (H) 65 - 99 mg/dL  Glucose, capillary     Status: Abnormal   Collection Time: 03/11/17  9:24 PM  Result Value Ref Range   Glucose-Capillary 153 (H) 65 - 99 mg/dL   Comment 1 Notify RN    Comment 2 Document in Chart   Glucose, capillary     Status: Abnormal   Collection Time: 03/12/17  6:11 AM  Result Value Ref Range   Glucose-Capillary 131 (H) 65 - 99 mg/dL   Comment 1 Notify RN    Comment 2 Document in Chart      Lipid Panel     Component Value Date/Time   CHOL 186 03/10/2017 0424   TRIG 147 03/10/2017 0424   HDL 43 03/10/2017 0424   CHOLHDL 4.3 03/10/2017 0424   VLDL 29 03/10/2017 0424   LDLCALC 114 (H) 03/10/2017 0424     Lab Results  Component Value Date   HGBA1C 6.6 (H) 03/10/2017   HGBA1C 6.5 (H) 08/25/2015       HPI :  76 y.o.femalewith medical history significant of Hypothyroidism, AS s/p TAVR, RA/FM, HTN, DM2, dCHF, CAD, h/o breast  cancer in 1980 who presents for acute neurological symptoms. Ms. Haber reports that she was in her normal state of health this morning until about 730am when she acutely developed room spinning sensation, diplopia and left arm  weakness and numbness. Unfortunately, she has a pacemaker which is not compatible with the MRI. Neurology was consulted  HOSPITAL COURSE:   Likely horizontal BPPV, small brainstem infarct can not be completely ruled out at this time  Resultant mild dizziness with motion, horizontal BPPV testing positive   MRI / MRA - PPM  CTA head and neck -  Right ICA 40% stenosis and b/l P2 stenosis  2D Echo EF 60-65%  Repeat CT  4/10 within normal limits  Pacer interrogation -no arrhythmias  LDL - 114  HgbA1c 6.6  Diet heart healthy/carb modified Room service appropriate? Yes; Fluid consistency: Thin  aspirin 81 mg daily prior to admission, now on aspirin 81 mg daily and clopidogrel 75 mg daily.   Therapy recommendations: HH PT recommended (vestibular rehab)  Disposition: HHPT    Hyperlipidemia  Home meds:  Lipitor 40 mg daily prior to admission resumed in hospital  LDL 114, goal < 70  Increase Lipitor to 80 mg daily  Continue statin at discharge   Type II diabetes mellitus  Hemoglobin A1c 6.6 - SSI  Hypokalemia-replete  Essential hypertension - BP on the low side tonight  Held antihypertensives to allow permissive HTN for 24-48 hours - Resume lisinopril, metoprolol, tamsulosin  Aortic valve stenosis, critical S/P TAVR (transcatheter aortic valve replacement) - On a daily aspirin,  restarted Plavix per neurology - No acute issues, some SOB with activity, but she has limited activity due to knee pain and debility TTE results as above Complete heart block s/p PCM placement - PCM  in place,  - Monitor on telemetry    Chronic diastolic congestive heart failure  - She reports intermittent swelling - Last TTE in our system form 2017 shows grade 1 failure - Holding antihypertensives as above.   MDD (major depressive disorder)  - She is taking bupropion and paroxetine, these were continued - Trazodone for sleep  Rheumatoid arthritis/Fibromyalgia - Currently  taking meloxicam daily, held in the acute setting, restart if needed - PRN tylenol for pain  Hypothyroidism  TSH 7.99,  free T4 0.95 - Continue synthroid  IBS (irritable bowel syndrome) She is on daily colace, had diarrhea a couple of days ago and now with abdominal pain Abdominal x-ray unremarkable LFTs, lipase okay  Obesity, Body mass index is 33.64 kg/m., recommend weight loss, diet and exercise as appropriate   Discharge Exam:   Blood pressure 136/64, pulse 75, temperature 98.8 F (37.1 C), temperature source Oral, resp. rate 18, height _0  (1.626 m), weight 88.9 kg (196 lb), SpO2 95 %.      Follow-up Information    MOREIRA,ROY, MD. Call.   Specialty:  Internal Medicine Why:  hospital follow up in 3-5 days  Contact information: [id number]  [address] [phone]           Signed: Reyne Dumas 03/12/2017, 8:35 AM        Time spent >45 mins

## 2017-03-12 NOTE — Progress Notes (Signed)
Occupational Therapy Treatment Patient Details Name: Catherine [name] MRN: [id number] DOB: March 11, 1941 Today's Date: 03/12/2017    History of present illness 76 y.o.femalewith medical history significant of Hypothyroidism, AS s/p TAVR, RA/FM, HTN, DM2, dCHF, CAD, h/o breast cancer in 1980 who presents with dizziness when looking left and L arm numbness. Pt admit for possible TIA. Pas surgical h/o pacemaker implant (2016).    OT comments  Pt progressing toward OT goals. She was able to complete UB dressing tasks after set-up and LB dressing tasks with maximum assistance. She required min assist for toilet transfers this session and reported dizziness at times. Educated pt on gaze fixation to alleviate dizziness while ambulating during ADL. Additionally educated pt and her daughter concerning need for assistance at all times post-acute D/C. OT will continue to follow acutely.  Follow Up Recommendations  Supervision/Assistance - 24 hour;No OT follow up    Equipment Recommendations  None recommended by OT    Recommendations for Other Services      Precautions / Restrictions Precautions Precautions: Fall Restrictions Weight Bearing Restrictions: No       Mobility Bed Mobility Overal bed mobility: Needs Assistance Bed Mobility: Sit to Supine   Sidelying to sit: Min assist   Sit to supine: Min guard Sit to sidelying: Min guard General bed mobility comments: Min guard assist for steadying.  Transfers Overall transfer level: Needs assistance Equipment used: Rolling walker (2 wheeled) Transfers: Sit to/from [facility] Sit to Stand: Min assist         General transfer comment: Min assist to steady.     Balance Overall balance assessment: Needs assistance Sitting-balance support: Feet supported;Bilateral upper extremity supported Sitting balance-Leahy Scale: Fair     Standing balance support: Bilateral upper extremity supported;No upper extremity  supported;During functional activity Standing balance-Leahy Scale: Poor Standing balance comment: Reliant on either physical assistance or UE support.                           ADL either performed or assessed with clinical judgement   ADL Overall ADL's : Needs assistance/impaired Eating/Feeding: Set up;Sitting Eating/Feeding Details (indicate cue type and reason): eating sitting at EOB when OT entered room Grooming: Set up;Sitting;Brushing hair           Upper Body Dressing : Set up;Sitting   Lower Body Dressing: Maximal assistance;Sit to/from stand   Toilet Transfer: Minimal assistance;Ambulation;BSC Toilet Transfer Details (indicate cue type and reason): BSC over toilet Toileting- Clothing Manipulation and Hygiene: Minimal assistance;Sit to/from stand       Functional mobility during ADLs: Minimal assistance;Rolling walker General ADL Comments: Pt with less dizziness this session with head turns but remains dizzy on standing and during ambulation. Pt educated on gaze fixation to improve this. Additionally educated pt and daughter concerning need for assistance during standing and ambulating ADL. They verbalize and demosntrate understanding.     Vision   Additional Comments: Nystagmus after ambulation lasting a few seconds.   Perception     Praxis      Cognition Arousal/Alertness: Awake/alert Behavior During Therapy: WFL for tasks assessed/performed Overall Cognitive Status: Within Functional Limits for tasks assessed Area of Impairment: Attention;Following commands;Safety/judgement;Problem solving;Memory                   Current Attention Level: Sustained Memory: Decreased recall of precautions;Decreased short-term memory Following Commands: Follows one step commands inconsistently;Follows one step commands with increased time Safety/Judgement: Decreased awareness of safety  Awareness: Emergent Problem Solving: Slow processing           Exercises     Shoulder Instructions       General Comments Based on report that last therapist noted horizontal nystagmus with head roll, checked again and found 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  Treated pt 1x with barbeque roll to treat horizontal canal.  Pt will need follow up at home with progression to OPPT as needed.    Pertinent Vitals/ Pain       Pain Assessment: No/denies pain  Home Living                                          Prior Functioning/Environment              Frequency  Min 2X/week        Progress Toward Goals  OT Goals(current goals can now be found in the care plan section)     Acute Rehab OT Goals Patient Stated Goal: return home OT Goal Formulation: With patient/family Time For Goal Achievement: 03/24/17 Potential to Achieve Goals: Good  Plan Discharge plan remains appropriate    Co-evaluation                 End of Session Equipment Utilized During Treatment: Gait belt;Rolling walker  OT Visit Diagnosis: Unsteadiness on feet (R26.81);Muscle weakness (generalized) (M62.81);Dizziness and giddiness (R42) (PT reports positive BPPV from evaluation)   Activity Tolerance Patient tolerated treatment well   Patient Left in bed;with call bell/phone within reach;with family/visitor present   Nurse Communication Mobility status    Functional Assessment Tool Used: AM-PAC 6 Clicks Daily Activity Functional Limitation: Self care Self Care Current Status (Q3009): At least 40 percent but less than 60 percent impaired, limited or restricted Self Care Goal Status (Q3300): At least 20 percent but less than 40 percent impaired, limited or restricted   Time: 1234-1258 OT Time Calculation (min): 24 min  Charges: OT G-codes **NOT FOR INPATIENT CLASS** Functional Assessment Tool Used: AM-PAC 6 Clicks Daily Activity Functional Limitation: Self care Self Care Current Status (T6226): At least 40 percent but less than 60  percent impaired, limited or restricted Self Care Goal Status (J3354): At least 20 percent but less than 40 percent impaired, limited or restricted OT General Charges $OT Visit: 1 Procedure OT Treatments $Self Care/Home Management : 23-37 mins  Catherine Herrlich, MS OTR/L  Pager: [place] A Ithiel Liebler 03/12/2017, 5:28 PM

## 2017-03-12 NOTE — Progress Notes (Signed)
Patient is discharged from room 5M18 at this time. Alert and in stable condition. IV site d/c'd as well as tele. Instructions read to patient and daughter with understanding verbalized. Left unit via wheelchair with all belongings at side.

## 2017-03-12 NOTE — Progress Notes (Signed)
qPhysical Therapy Treatment Patient Details Name: Catherine [name] MRN: [id number] DOB: 1941/07/20 Today's Date: 03/12/2017    History of Present Illness 76 y.o.femalewith medical history significant of Hypothyroidism, AS s/p TAVR, RA/FM, HTN, DM2, dCHF, CAD, h/o breast cancer in 1980 who presents with dizziness when looking left and L arm numbness. Pt admit for possible TIA. Pas surgical h/o pacemaker implant (2016).     PT Comments    Emphasis on treating pt's horizontal BPPV.  Will need further follow up.   Follow Up Recommendations  Home health PT;Supervision/Assistance - 24 hour     Equipment Recommendations  None recommended by PT    Recommendations for Other Services       Precautions / Restrictions Precautions Precautions: Fall    Mobility  Bed Mobility Overal bed mobility: Needs Assistance Bed Mobility: Sit to Supine   Sidelying to sit: Min assist   Sit to supine: Min guard Sit to sidelying: Min guard General bed mobility comments: steady assist only except after vestibular treatment  Transfers Overall transfer level: Needs assistance   Transfers: Sit to/from Stand Sit to Stand: Min assist         General transfer comment: stability assist, still with some nystagmus and dysequilibrium  Ambulation/Gait Ambulation/Gait assistance: Min assist Ambulation Distance (Feet): 15 Feet (x2 to bathroom  and additional 70 feet with rW) Assistive device: Rolling walker (2 wheeled) Gait Pattern/deviations: Step-through pattern     General Gait Details: steady withing RW, tends to walk too far from [employer]    Modified Rankin (Stroke Patients Only) Modified Rankin (Stroke Patients Only) Modified Rankin: Moderately severe disability     Balance Overall balance assessment: Needs assistance   Sitting balance-Leahy Scale: Fair       Standing balance-Leahy Scale: Poor Standing balance comment: reliant on physical  assist; UE support                            Cognition Arousal/Alertness: Awake/alert Behavior During Therapy: WFL for tasks assessed/performed Overall Cognitive Status: Within Functional Limits for tasks assessed Area of Impairment: Attention;Following commands;Safety/judgement;Problem solving;Memory                   Current Attention Level: Sustained Memory: Decreased recall of precautions;Decreased short-term memory Following Commands: Follows one step commands inconsistently;Follows one step commands with increased time Safety/Judgement: Decreased awareness of safety Awareness: Emergent Problem Solving: Slow processing        Exercises      General Comments General comments (skin integrity, edema, etc.): Based on report that last therapist noted horizontal nystagmus with head roll, checked again and found 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  Treated pt 1x with barbeque roll to treat horizontal canal.  Pt will need follow up at home with progression to OPPT as needed.      Pertinent Vitals/Pain Pain Assessment: No/denies pain    Home Living                      Prior Function            PT Goals (current goals can now be found in the care plan section) Acute Rehab PT Goals Patient Stated Goal: return home PT Goal Formulation: With patient Time For Goal Achievement: 03/24/17 Potential to Achieve Goals: Good Progress towards PT goals: Progressing toward goals    Frequency  Min 3X/week      PT Plan Current plan remains appropriate    Co-evaluation             End of Session   Activity Tolerance: Patient tolerated treatment well Patient left: in bed;with call bell/phone within reach;with family/visitor present;with bed alarm set Nurse Communication: Mobility status;Other (comment) PT Visit Diagnosis: BPPV;Other abnormalities of gait and mobility (R26.89) BPPV - Right/Left : Right     Time: 1020-1108 PT Time  Calculation (min) (ACUTE ONLY): 48 min  Charges:  $Therapeutic Activity: 8-22 mins $Neuromuscular Re-education: 8-22 mins $Canalith Rep Proc: 8-22 mins                    G Codes:       Mar 28, 2017  Donnella Sham, PT [phone]  (pager)   Tessie Fass Jianni Batten 03/28/2017, 4:16 PM

## 2017-03-12 NOTE — Care Management Note (Signed)
Case Management Note  Patient Details  Name: Catherine [name] MRN: [id number] Date of Birth: 1940/12/12  Subjective/Objective:  Pt in with TIA. She is from home with family.                   Action/Plan: Plan is for her to d/c home with [facility] services. CM met with the patient and her daughter and provided a list of [facility] agencies. They chose [facility] then [facility]. [facility] unable to accept the referral. Santiago Glad with [facility] able to accept. Pt has transportation home. No DME needs.   Expected Discharge Date:                  Expected Discharge Plan:  [place]  In-House Referral:     Discharge planning Services  CM Consult  Post Acute Care Choice:  Home Health Choice offered to:  Patient, Adult Children  DME Arranged:    DME Agency:     HH Arranged:  PT, OT, Nurse's Aide [place] Agency:  [place]  Status of Service:  Completed, signed off  If discussed at [place] of Stay Meetings, dates discussed:    Additional Comments:  Pollie Friar, RN 03/12/2017, 11:19 AM

## 2017-03-12 NOTE — Progress Notes (Signed)
STROKE TEAM PROGRESS NOTE   SUBJECTIVE (INTERVAL HISTORY) Her daughter and husband are at the bedside. Pt stable overnight. She stated her dizziness better with position after maneuver training with PT today.   OBJECTIVE Temp:  [97.8 F (36.6 C)-98.8 F (37.1 C)] 98.2 F (36.8 C) (04/10 1420) Pulse Rate:  [70-95] 77 (04/10 1420) Cardiac Rhythm: Bundle branch block (04/10 0700) Resp:  [18] 18 (04/10 1420) BP: (127-167)/(49-75) 127/64 (04/10 1420) SpO2:  [95 %-100 %] 98 % (04/10 1420)  CBC:   Recent Labs Lab 03/09/17 1724  03/10/17 0424 03/11/17 0420  WBC 8.5  --  8.0 8.1  NEUTROABS 5.5  --   --   --   HGB 10.9*  < > 10.9* 10.7*  HCT 34.1*  < > 34.9* 34.6*  MCV 85.7  --  86.2 87.6  PLT 236  --  199 206  < > = values in this interval not displayed.  Basic Metabolic Panel:   Recent Labs Lab 03/10/17 0424 03/10/17 1007 03/11/17 0420  NA 139  --  137  K 3.4*  --  3.7  CL 103  --  102  CO2 28  --  28  GLUCOSE 104*  --  189*  BUN 10  --  9  CREATININE 0.93  --  1.00  CALCIUM 9.0  --  9.2  MG  --  1.9  --     Lipid Panel:     Component Value Date/Time   CHOL 186 03/10/2017 0424   TRIG 147 03/10/2017 0424   HDL 43 03/10/2017 0424   CHOLHDL 4.3 03/10/2017 0424   VLDL 29 03/10/2017 0424   LDLCALC 114 (H) 03/10/2017 0424   HgbA1c:  Lab Results  Component Value Date   HGBA1C 6.6 (H) 03/10/2017   Urine Drug Screen:     Component Value Date/Time   LABOPIA NONE DETECTED 03/09/2017 1724   COCAINSCRNUR NONE DETECTED 03/09/2017 1724   COCAINSCRNUR NONE DETECTED 03/06/2016 1942   LABBENZ NONE DETECTED 03/09/2017 1724   AMPHETMU NONE DETECTED 03/09/2017 1724   THCU NONE DETECTED 03/09/2017 1724   LABBARB NONE DETECTED 03/09/2017 1724     IMAGING I have personally reviewed the radiological images below and agree with the radiology interpretations  Ct Head Wo Contrast 03/09/2017 Normal CT HEAD for age.   Ct Angio Head W Or Wo Contrast 03/10/2017 IMPRESSION: 1.  Negative CTA for large vessel occlusion. 2. Severe stenoses involving the P2 segments bilaterally. 3. Smooth atheromatous plaque throughout the carotid siphons with moderate diffuse narrowing, right worse than left. 4. Hypoplastic/absent right A1 segment, with the anterior cerebral arteries supplied via the left carotid artery system.   Ct Angio Neck W Or Wo Contrast 03/10/2017 IMPRESSION: 1. Atheromatous stenosis about the proximal ICAs bilaterally, with associated short-segment stenosis of up to 40% by NASCET criteria, right slightly worse than left. 2. Patent vertebral arteries within the neck.   TTE - LVEF 60-65%.  A TAVR bioprosthesis sits well in the aortic position. There are normal transaortic gradients and unchaged from prior on 10/09/2016.  There is no central AI or paravalvular leak.  Ct Head Wo Contrast 03/12/2017 IMPRESSION: Normal head CT for age.     PHYSICAL EXAM  Temp:  [97.8 F (36.6 C)-98.8 F (37.1 C)] 98.2 F (36.8 C) (04/10 1420) Pulse Rate:  [70-95] 77 (04/10 1420) Resp:  [18] 18 (04/10 1420) BP: (127-167)/(49-75) 127/64 (04/10 1420) SpO2:  [95 %-100 %] 98 % (04/10 1420)  General - Well  nourished, well developed, in no apparent distress.  Cardiovascular - Regular rate and rhythm.  Mental Status -  Level of arousal and orientation to time, place, and person were intact. Language including expression, naming, repetition, comprehension was assessed and found intact.  Cranial Nerves II - XII - II - Visual field intact OU. III, IV, VI - Extraocular movements intact. V - symmetrical VII - Facial movement intact bilaterally. VIII - Hearing & vestibular intact bilaterally. X - Palate elevates symmetrically. XI - Chin turning & shoulder shrug intact bilaterally. XII - Tongue protrusion intact.  Motor Strength - The patient's strength was normal in all extremities and pronator drift was absent.  Bulk was normal and fasciculations were absent.    Motor Tone -  Muscle tone was assessed at the neck and appendages and was normal.  Reflexes - The patient's reflexes were 1+ in all extremities and she had no pathological reflexes.  Sensory - Light touch in tact throughout   Coordination - The patient had normal movements in the hands and feet with no ataxia or dysmetria.   Gait and Station - deferred  [employer] test yesterday negative for vertigo and nystagmus but complaining of lightheadedness. However, horizontal BPPV test showed positive nystagmus and feeling of vertigo lasting 30 sec on the right and 64min on the left.    ASSESSMENT/PLAN Ms. SHAWNA WEARING is a 76 y.o. female with history of hypothyroidism, transcatheter aortic valve replacement, rheumatoid arthritis, history of rectal bleeding, hyperlipidemia, hypertension, diabetes mellitus, depression, congestive heart failure, complete heart block status post pacemaker, history of breast cancer, and history of coronary artery disease presenting with vertigo, left sided numbness, left-sided headache, neck pain, and nausea. She did not receive IV t-PA due to Late presentation and improvement in deficits.  Likely horizontal BPPV, but small brainstem infarct can not be completely ruled out at this time  Resultant mild dizziness with motion, horizontal BPPV testing positive   MRI / MRA - PPM  CTA head and neck -  Right ICA 40% stenosis and b/l P2 stenosis  2D Echo EF 60-65%  Repeat CT no acute findings  Pacer interrogation - no arrhythmia as per Dr. Ledell Peoples note  LDL - 114  HgbA1c 6.6  VTE prophylaxis - Lovenox Diet heart healthy/carb modified Room service appropriate? Yes; Fluid consistency: Thin Diet - low sodium heart healthy  aspirin 81 mg daily prior to admission, now on aspirin 81 mg daily and clopidogrel 75 mg daily. Continue DAPT for 3 months and then either ASA or plavix alone.   Patient counseled to be compliant with her antithrombotic medications  Ongoing aggressive  stroke risk factor management  Therapy recommendations: HH PT recommended (vestibular rehab)  Disposition:  Pending  Hypertension  Stable Long-term BP goal normotensive  Hyperlipidemia  Home meds:  Lipitor 40 mg daily prior to admission resumed in hospital  LDL 114, goal < 70  Increase Lipitor to 80 mg daily  Continue statin at discharge  Diabetes  HgbA1c 6.6, goal < 7.0  Controlled  Other Stroke Risk Factors  Advanced age  Obesity, Body mass index is 33.64 kg/m., recommend weight loss, diet and exercise as appropriate   Family hx stroke (sister)  Coronary artery disease  Other Active Problems  s/p TAVR  CHB s/p pacer  Hospital day # 0   Neurology will sign off. Please call with questions. No neuro follow up needed at this time. Thanks for the consult.   Rosalin Hawking, MD PhD Stroke Neurology  03/12/2017 3:02 PM   To contact Stroke Continuity provider, please refer to [email or web address] After hours, contact General Neurology

## 2017-03-13 ENCOUNTER — Telehealth (HOSPITAL_COMMUNITY): Payer: Self-pay

## 2017-03-13 DIAGNOSIS — F331 Major depressive disorder, recurrent, moderate: Secondary | ICD-10-CM

## 2017-03-13 NOTE — Telephone Encounter (Signed)
Medication management - Message left for patient's daughter who called to request a one time refill of patient's medications; Trazodone, Klonopin, Wellbutrin XL and Paroxetine.  Informed would send message to Dr.Arfeen as patient is now out and needs a refill as quickly as possible.  Informed Dr. Adele Schilder would be back in the office on 03/14/17 and would send message with request.  Patient last seen 12/11/16 and given orders then + 2 refills.  Patient cancelled and rescheduled from 03/11/17 due to provider out and now has next follow up appointment 04/03/17.

## 2017-03-14 MED ORDER — BUPROPION HCL ER (XL) 300 MG PO TB24: 300.0000 mg | ORAL_TABLET | Freq: Every day | ORAL | 0 refills | Status: DC

## 2017-03-14 MED ORDER — PAROXETINE HCL 30 MG PO TABS: 30.0000 mg | ORAL_TABLET | Freq: Every day | ORAL | 0 refills | Status: DC

## 2017-03-14 MED ORDER — CLONAZEPAM 1 MG PO TABS: 1.0000 mg | ORAL_TABLET | Freq: Every day | ORAL | 0 refills | Status: DC

## 2017-03-14 MED ORDER — TRAZODONE HCL 50 MG PO TABS: 50.0000 mg | ORAL_TABLET | Freq: Every evening | ORAL | 0 refills | Status: DC | PRN

## 2017-03-14 NOTE — Telephone Encounter (Signed)
Called in a one time 30 day order for patient's approved Klonopin 1 mg, one at bedtime, #30 with no refills with Lennette Bihari, pharmacist at [facility] in [place] and e-scribed in 30 day orders for patient's Wellbutrin XL, Trazodone and Paxil as approved by Dr. Adele Schilder.  Called patient's daughter to inform medication orders were sent to patient's [facility] pharmacy as requested and reminded of patient's next appointment set for 04/03/17.

## 2017-03-14 NOTE — Telephone Encounter (Signed)
Ok to provide 30 days supply

## 2017-04-03 ENCOUNTER — Encounter (HOSPITAL_COMMUNITY): Payer: Self-pay | Admitting: Psychiatry

## 2017-04-03 ENCOUNTER — Ambulatory Visit (INDEPENDENT_AMBULATORY_CARE_PROVIDER_SITE_OTHER): Payer: 59 | Admitting: Psychiatry

## 2017-04-03 DIAGNOSIS — F331 Major depressive disorder, recurrent, moderate: Secondary | ICD-10-CM | POA: Diagnosis not present

## 2017-04-03 DIAGNOSIS — F419 Anxiety disorder, unspecified: Secondary | ICD-10-CM

## 2017-04-03 DIAGNOSIS — Z7982 Long term (current) use of aspirin: Secondary | ICD-10-CM | POA: Diagnosis not present

## 2017-04-03 DIAGNOSIS — Z79899 Other long term (current) drug therapy: Secondary | ICD-10-CM | POA: Diagnosis not present

## 2017-04-03 DIAGNOSIS — Z818 Family history of other mental and behavioral disorders: Secondary | ICD-10-CM | POA: Diagnosis not present

## 2017-04-03 MED ORDER — TRAZODONE HCL 50 MG PO TABS: 50.0000 mg | ORAL_TABLET | Freq: Every evening | ORAL | 2 refills | Status: DC | PRN

## 2017-04-03 MED ORDER — CLONAZEPAM 1 MG PO TABS: 1.0000 mg | ORAL_TABLET | Freq: Every day | ORAL | 2 refills | Status: DC

## 2017-04-03 MED ORDER — PAROXETINE HCL 30 MG PO TABS: 30.0000 mg | ORAL_TABLET | Freq: Every day | ORAL | 2 refills | Status: DC

## 2017-04-03 MED ORDER — BUPROPION HCL ER (XL) 300 MG PO TB24: 300.0000 mg | ORAL_TABLET | Freq: Every day | ORAL | 2 refills | Status: DC
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
## 2017-04-13 ENCOUNTER — Encounter: Payer: Self-pay | Admitting: Emergency Medicine

## 2017-04-13 ENCOUNTER — Emergency Department: Payer: Medicare Other

## 2017-04-13 ENCOUNTER — Emergency Department
Admission: EM | Admit: 2017-04-13 | Discharge: 2017-04-13 | Disposition: A | Discharge status: Home / Self Care | Payer: Medicare Other | Attending: Emergency Medicine | Admitting: Emergency Medicine

## 2017-04-13 DIAGNOSIS — E119 Type 2 diabetes mellitus without complications: Secondary | ICD-10-CM | POA: Insufficient documentation

## 2017-04-13 DIAGNOSIS — Z79899 Other long term (current) drug therapy: Secondary | ICD-10-CM | POA: Diagnosis not present

## 2017-04-13 DIAGNOSIS — R1032 Left lower quadrant pain: Secondary | ICD-10-CM | POA: Diagnosis present

## 2017-04-13 DIAGNOSIS — E039 Hypothyroidism, unspecified: Secondary | ICD-10-CM | POA: Diagnosis not present

## 2017-04-13 DIAGNOSIS — K5900 Constipation, unspecified: Secondary | ICD-10-CM | POA: Insufficient documentation

## 2017-04-13 DIAGNOSIS — Z853 Personal history of malignant neoplasm of breast: Secondary | ICD-10-CM | POA: Insufficient documentation

## 2017-04-13 DIAGNOSIS — I5032 Chronic diastolic (congestive) heart failure: Secondary | ICD-10-CM | POA: Diagnosis not present

## 2017-04-13 DIAGNOSIS — Z7982 Long term (current) use of aspirin: Secondary | ICD-10-CM | POA: Insufficient documentation

## 2017-04-13 DIAGNOSIS — I110 Hypertensive heart disease with heart failure: Secondary | ICD-10-CM | POA: Insufficient documentation

## 2017-04-13 DIAGNOSIS — I2510 Atherosclerotic heart disease of native coronary artery without angina pectoris: Secondary | ICD-10-CM | POA: Diagnosis not present

## 2017-04-13 HISTORY — DX: Systemic involvement of connective tissue, unspecified: M35.9

## 2017-04-13 LAB — CBC
HCT: 34.3 % — ABNORMAL LOW (ref 35.0–47.0)
Hemoglobin: 11.7 g/dL — ABNORMAL LOW (ref 12.0–16.0)
MCH: 28.6 pg (ref 26.0–34.0)
MCHC: 34 g/dL (ref 32.0–36.0)
MCV: 84.2 fL (ref 80.0–100.0)
PLATELETS: 233 10*3/uL (ref 150–440)
RBC: 4.08 MIL/uL (ref 3.80–5.20)
RDW: 15.4 % — AB (ref 11.5–14.5)
WBC: 9.7 10*3/uL (ref 3.6–11.0)

## 2017-04-13 LAB — COMPREHENSIVE METABOLIC PANEL
ALBUMIN: 4 g/dL (ref 3.5–5.0)
ALK PHOS: 61 U/L (ref 38–126)
ALT: 12 U/L — ABNORMAL LOW (ref 14–54)
AST: 22 U/L (ref 15–41)
Anion gap: 8 (ref 5–15)
BILIRUBIN TOTAL: 0.7 mg/dL (ref 0.3–1.2)
BUN: 22 mg/dL — AB (ref 6–20)
CALCIUM: 9.2 mg/dL (ref 8.9–10.3)
CO2: 25 mmol/L (ref 22–32)
CREATININE: 1.05 mg/dL — AB (ref 0.44–1.00)
Chloride: 102 mmol/L (ref 101–111)
GFR calc Af Amer: 58 mL/min — ABNORMAL LOW (ref >60)
GFR calc non Af Amer: 50 mL/min — ABNORMAL LOW (ref >60)
GLUCOSE: 159 mg/dL — AB (ref 65–99)
Potassium: 4.1 mmol/L (ref 3.5–5.1)
Sodium: 135 mmol/L (ref 135–145)
TOTAL PROTEIN: 7.4 g/dL (ref 6.5–8.1)

## 2017-04-13 LAB — URINALYSIS, COMPLETE (UACMP) WITH MICROSCOPIC
BILIRUBIN URINE: NEGATIVE
Glucose, UA: NEGATIVE mg/dL
Hgb urine dipstick: NEGATIVE
Ketones, ur: NEGATIVE mg/dL
LEUKOCYTES UA: NEGATIVE
Nitrite: NEGATIVE
PROTEIN: NEGATIVE mg/dL
RBC / HPF: NONE SEEN RBC/hpf (ref 0–5)
SPECIFIC GRAVITY, URINE: 1.015 (ref 1.005–1.030)
pH: 5 (ref 5.0–8.0)

## 2017-04-13 LAB — LIPASE, BLOOD: Lipase: 26 U/L (ref 11–51)

## 2017-04-13 MED ORDER — POLYETHYLENE GLYCOL 3350 17 G PO PACK: 17.0000 g | PACK | Freq: Every day | ORAL | 0 refills | Status: DC

## 2017-04-13 MED ORDER — ONDANSETRON HCL 4 MG/2ML IJ SOLN
4.0000 mg | Freq: Once | INTRAMUSCULAR | Status: AC
  Administered 2017-04-13: 4 mg via INTRAVENOUS
  Filled 2017-04-13: qty 2

## 2017-04-13 MED ORDER — IOPAMIDOL (ISOVUE-300) INJECTION 61%
100.0000 mL | Freq: Once | INTRAVENOUS | Status: AC | PRN
  Administered 2017-04-13: 100 mL via INTRAVENOUS

## 2017-04-13 MED ORDER — MORPHINE SULFATE (PF) 2 MG/ML IV SOLN
2.0000 mg | Freq: Once | INTRAVENOUS | Status: AC
  Administered 2017-04-13: 2 mg via INTRAVENOUS
  Filled 2017-04-13: qty 1

## 2017-04-13 MED ORDER — POLYETHYLENE GLYCOL 3350 17 G PO PACK
17.0000 g | PACK | Freq: Every day | ORAL | Status: DC
  Administered 2017-04-13: 17 g via ORAL
  Filled 2017-04-13: qty 1

## 2017-04-13 MED ORDER — IOPAMIDOL (ISOVUE-300) INJECTION 61%
30.0000 mL | Freq: Once | INTRAVENOUS | Status: AC | PRN
  Administered 2017-04-13: 30 mL via ORAL

## 2017-04-13 MED ORDER — ONDANSETRON 4 MG PO TBDP
4.0000 mg | ORAL_TABLET | Freq: Once | ORAL | Status: AC | PRN
  Administered 2017-04-13: 4 mg via ORAL
  Filled 2017-04-13: qty 1

## 2017-04-13 NOTE — ED Triage Notes (Signed)
Patient presents to the ED with constipation x 4 days.  Patient is also complaining of left lower quadrant pain for the same amount of time that seems to be getting more severe.  Patient states her husband gave her a fleet enema this am and patient was able to pass some stool but this did not relieve her pain.  Patient has history of an incarcerated hernia.  Patient is calm and cooperative at this time.

## 2017-04-13 NOTE — ED Provider Notes (Signed)
[facility] Emergency Department Provider Note    First MD Initiated Contact with Patient 04/13/17 1637     (approximate)  I have reviewed the triage vital signs and the nursing notes.   HISTORY  Chief Complaint Constipation and Abdominal Pain    HPI Catherine [name] is a 76 y.o. female with below list of chronic medical conditions including diverticulitis presents with four-day history of left upper/left lower quadrant abdominal pain or current pain score of 8 out of 10 described as sharp. Patient also admits to constipation. Patient states that she took a fleets enema today and had a small bowel following bowel movement patient states no improvement of pain she denies any fever does admit to nausea no vomiting. Patient denies any urinary symptoms.   Past Medical History:  Diagnosis Date  . Acute colitis   . Anemia   . CAD (coronary artery disease)    a. heavy calcification of the entire LAD & mod eccentric mid LAD stenosis, estimated @ 70%, mild nonobs stenosis of RCA and LCx, severely calcified Ao valve with restricted valve mobility and 2+ AI    . Cancer [facility]) 1981   breast  . CHB (complete heart block) ([place]) 08/31/2015   [place] DR model [id number] (serial number [id number] )   . Chronic diastolic congestive heart failure ([place])    a. echo 08/31/2015: EF 65-70%, nl WM, GR1DD, Ao valve stent bioprosthesis was present and functioning nl, no regurg, LA mildly dilated, PASP 46 mm Hg  . Collagen vascular disease ([place])   . Colon polyps   . Depression   . Diabetes mellitus type II   . Fibromyalgia   . HTN (hypertension)   . Hypercholesteremia   . Hypothyroidism   . IBS (irritable bowel syndrome)   . Rectal bleeding   . Rheumatoid arthritis(714.0)   . S/P TAVR (transcatheter aortic valve replacement) 08/30/2015   26 mm Edwards Sapien 3 transcatheter heart valve placed via open right transfemoral approach  . Shortness of breath dyspnea   .  Stenosis of aortic valve   . Thyroid disease     Patient Active Problem List   Diagnosis Date Noted  . Abdominal pain   . BPPV (benign paroxysmal positional vertigo), bilateral   . CVA (cerebral vascular accident) ([place])   . TIA (transient ischemic attack) 03/09/2017  . Dizziness   . Suicidal ideation 03/06/2016  . Severe recurrent major depression without psychotic features ([place])   . Chest pain 09/24/2015  . Hypokalemia 09/07/2015  . IBS (irritable bowel syndrome)   . Cancer ([place])   . Pneumothorax 09/05/2015  . Complete heart block ([place])   . S/P TAVR (transcatheter aortic valve replacement) 08/30/2015  . Type II diabetes mellitus ([place])   . Essential hypertension   . Chronic diastolic congestive heart failure ([place])   . Rheumatoid arthritis ([place])   . Hypothyroidism   . Fibromyalgia   . Aortic valve stenosis, critical 08/22/2015  . MDD (major depressive disorder) ([place]) 05/26/2012    Past Surgical History:  Procedure Laterality Date  . ABDOMINAL SURGERY     Intestinal surgery  . CARDIAC SURGERY    . CESAREAN SECTION     x 2  . EP IMPLANTABLE DEVICE N/A 08/31/2015   Procedure: Pacemaker Implant;  Surgeon: Will Meredith Leeds, MD; [place] (serial number [id number] ); Laterality: Right  . LEFT OOPHORECTOMY    . MASTECTOMY Left   . polyp     .  self inflicted chest wound    . [place]   lower back  . TEE WITHOUT CARDIOVERSION N/A 08/30/2015   Procedure: TRANSESOPHAGEAL ECHOCARDIOGRAM (TEE);  Surgeon: Sherren Mocha, MD;  Location: [place];  Service: Open Heart Surgery;  Laterality: N/A;  . TOTAL ABDOMINAL HYSTERECTOMY    . TRANSCATHETER AORTIC VALVE REPLACEMENT, TRANSFEMORAL N/A 08/30/2015   Procedure: TRANSCATHETER AORTIC VALVE REPLACEMENT, TRANSFEMORAL;  Surgeon: Sherren Mocha, MD;  Location: [place];  Service: Open Heart Surgery;  Laterality: N/A;    Prior to Admission medications   Medication Sig Start Date End Date Taking?  Authorizing Provider  acetaminophen (TYLENOL) 325 MG tablet Take 650 mg by mouth every 6 (six) hours as needed for moderate pain.    [provider]  aspirin EC 81 MG tablet Take 81 mg by mouth daily.    [provider]  atorvastatin (LIPITOR) 80 MG tablet Take 1 tablet (80 mg total) by mouth every evening. 03/12/17   Reyne Dumas, MD  buPROPion (WELLBUTRIN XL) 300 MG 24 hr tablet Take 1 tablet (300 mg total) by mouth daily. 04/03/17   Arfeen, Arlyce Harman, MD  clonazePAM (KLONOPIN) 1 MG tablet Take 1 tablet (1 mg total) by mouth at bedtime. 04/03/17   Arfeen, Arlyce Harman, MD  clopidogrel (PLAVIX) 75 MG tablet Take 1 tablet (75 mg total) by mouth daily. 03/12/17   Reyne Dumas, MD  docusate sodium (COLACE) 100 MG capsule Take 100 mg by mouth 2 (two) times daily.    [provider]  KLOR-CON M10 10 MEQ tablet TAKE 1 TABLET BY MOUTH EVERY DAY 10/01/16   Bhagat, Bhavinkumar, PA  levothyroxine (SYNTHROID, LEVOTHROID) 100 MCG tablet Take 100 mcg by mouth daily before breakfast.     [provider]  Linagliptin-Metformin HCl (JENTADUETO) 2.5-500 MG TABS Take 0.5 tablets by mouth daily with breakfast.     [provider]  lisinopril (PRINIVIL,ZESTRIL) 10 MG tablet TAKE 1 TABLET (10 MG TOTAL) BY MOUTH DAILY. 12/05/16   Sherren Mocha, MD  metoprolol tartrate (LOPRESSOR) 25 MG tablet Take 25 mg by mouth daily.     [provider]  Multiple Vitamin (MULTIVITAMIN WITH MINERALS) TABS tablet Take 1 tablet by mouth daily at 12 noon.     [provider]  ondansetron (ZOFRAN) 4 MG tablet Take 4 mg by mouth as needed for nausea or vomiting.    [provider]  pantoprazole (PROTONIX) 40 MG tablet TAKE 1 TABLET BY MOUTH EVERY DAY 11/27/16   Sherren Mocha, MD  PARoxetine (PAXIL) 30 MG tablet Take 1 tablet (30 mg total) by mouth daily. 04/03/17   Arfeen, Arlyce Harman, MD  polyethylene glycol [facility]) packet Take 17 g by mouth daily. 04/13/17   Gregor Hams, MD    tamsulosin (FLOMAX) 0.4 MG CAPS capsule Take 0.4 mg by mouth daily after breakfast.    [provider]  traZODone (DESYREL) 50 MG tablet Take 1 tablet (50 mg total) by mouth at bedtime as needed for sleep. 04/03/17   Arfeen, Arlyce Harman, MD    Allergies Sulfa antibiotics and Latex  Family History  Problem Relation Age of Onset  . Depression Sister   . Depression Sister   . Depression Sister   . Hypertension Mother   . Lung cancer Sister   . Stroke Sister   . Hypertension Sister   . Heart attack Neg Hx     Social History Social History  Substance Use Topics  . Smoking status: Never Smoker  .  Smokeless tobacco: Never Used  . Alcohol use No    Review of Systems Constitutional: No fever/chills Eyes: No visual changes. ENT: No sore throat. Cardiovascular: Denies chest pain. Respiratory: Denies shortness of breath. Gastrointestinal: Positive for abdominal pain and nausea Genitourinary: Negative for dysuria. Musculoskeletal: Negative for back pain. Integumentary: Negative for rash. Neurological: Negative for headaches, focal weakness or numbness.   ____________________________________________   PHYSICAL EXAM:  VITAL SIGNS: ED Triage Vitals [04/13/17 1513]  Enc Vitals Group     BP (!) 136/54     Pulse Rate 97     Resp 18     Temp 99.8 F (37.7 C)     Temp Source Oral     SpO2 96 %     Weight 190 lb (86.2 kg)     Height 5\' 3"  (1.6 m)     Head Circumference      Peak Flow      Pain Score 8     Pain Loc      Pain Edu?      Excl. in [place]?     Constitutional: Alert and oriented. Well appearing and in no acute distress. Eyes: Conjunctivae are normal. PERRL. EOMI. Head: Atraumatic. Mouth/Throat: Mucous membranes are moist. Oropharynx non-erythematous. Neck: No stridor.   Cardiovascular: Normal rate, regular rhythm. Good peripheral circulation. Grossly normal heart sounds. Respiratory: Normal respiratory effort.  No retractions. Lungs CTAB. Gastrointestinal:  Lower quadrant tenderness to palpation. No distention.  Musculoskeletal: No lower extremity tenderness nor edema. No gross deformities of extremities. Neurologic:  Normal speech and language. No gross focal neurologic deficits are appreciated.  Skin:  Skin is warm, dry and intact. No rash noted. Psychiatric: Mood and affect are normal. Speech and behavior are normal.  ____________________________________________   LABS (all labs ordered are listed, but only abnormal results are displayed)  Labs Reviewed  COMPREHENSIVE METABOLIC PANEL - Abnormal; Notable for the following:       Result Value   Glucose, Bld 159 (*)    BUN 22 (*)    Creatinine, Ser 1.05 (*)    ALT 12 (*)    GFR calc non Af Amer 50 (*)    GFR calc Af Amer 58 (*)    All other components within normal limits  CBC - Abnormal; Notable for the following:    Hemoglobin 11.7 (*)    HCT 34.3 (*)    RDW 15.4 (*)    All other components within normal limits  URINALYSIS, COMPLETE (UACMP) WITH MICROSCOPIC - Abnormal; Notable for the following:    Color, Urine YELLOW (*)    APPearance HAZY (*)    Bacteria, UA RARE (*)    Squamous Epithelial / LPF 0-5 (*)    All other components within normal limits  LIPASE, BLOOD    RADIOLOGY I, [place] Alexzia Kasler, personally viewed and evaluated these images (plain radiographs) as part of my medical decision making, as well as reviewing the written report by the radiologist.  Ct Abdomen Pelvis W Contrast  Result Date: 04/13/2017 CLINICAL DATA:  Left abdominal pain, nausea EXAM: CT ABDOMEN AND PELVIS WITH CONTRAST TECHNIQUE: Multidetector CT imaging of the abdomen and pelvis was performed using the standard protocol following bolus administration of intravenous contrast. CONTRAST:  118mL ISOVUE-300 IOPAMIDOL (ISOVUE-300) INJECTION 61% COMPARISON:  09/24/2015 FINDINGS: Lower chest: Heart is top-normal in size. Prosthetic aortic valve. Pacemaker leads, incompletely visualized. Minimal dependent  atelectasis at the lung bases. Hepatobiliary: Liver is notable for probable focal fat adjacent to the  gallbladder fossa (series 2/image 25). Gallbladder is unremarkable. No intrahepatic or extrahepatic ductal dilatation. Pancreas: Within normal limits. Spleen: Within normal limits. Adrenals/Urinary Tract: Adrenal glands are within normal limits. 1.4 cm hyperdense/hemorrhagic cyst along the right lower kidney (coronal image 79). 2 mm nonobstructing right lower pole renal calculus (coronal image 79). Left kidney is within normal limits.  No hydronephrosis. Bladder is within normal limits. Stomach/Bowel: Stomach is within normal limits. No evidence of bowel obstruction. Appendix is not discretely visualized. Mild to moderate left colonic stool burden. Vascular/Lymphatic: No evidence of abdominal aortic aneurysm. Atherosclerotic calcifications of the abdominal aorta and branch vessels. No suspicious abdominopelvic lymphadenopathy. Reproductive: Status post hysterectomy. Right ovaries within normal limits.  No left adnexal mass. Other: No abdominopelvic ascites. Surgical clips in the right inguinal region (series 2/ image 43). Musculoskeletal: Degenerative changes of the visualized thoracolumbar spine. IMPRESSION: No evidence of bowel obstruction. Appendix is not discretely visualized. Mild to moderate left colonic stool burden, suggesting constipation. 2 mm nonobstructing right lower pole renal calculus. No hydronephrosis. 14 mm hemorrhagic right lower pole renal cyst, benign (Bosniak II). Electronically Signed   By: Julian Hy M.D.   On: 04/13/2017 18:33      Procedures   ____________________________________________   INITIAL IMPRESSION / ASSESSMENT AND PLAN / ED COURSE  Pertinent labs & imaging results that were available during my care of the patient were reviewed by me and considered in my medical decision making (see chart for details).  76 year old female presenting with left lower  quadrant/left upper quadrant abdominal pain concerning for possible diverticulitis and CT scan was performed however no evidence of diverticulitis noticed on the CT patient noted to be constipated. Patient received an enema in emergency department we'll prescribe MiraLAX for home. Patient advised to return to emergency department immediately pain were to worsen fever or any other emergency medical concerns.      ____________________________________________  FINAL CLINICAL IMPRESSION(S) / ED DIAGNOSES  Final diagnoses:  Constipation, unspecified constipation type     MEDICATIONS GIVEN DURING THIS VISIT:  Medications  polyethylene glycol (MIRALAX / GLYCOLAX) packet 17 g (17 g Oral Given 04/13/17 1942)  ondansetron (ZOFRAN-ODT) disintegrating tablet 4 mg (4 mg Oral Given 04/13/17 1524)  morphine 2 MG/ML injection 2 mg (2 mg Intravenous Given 04/13/17 1648)  ondansetron (ZOFRAN) injection 4 mg (4 mg Intravenous Given 04/13/17 1648)  iopamidol (ISOVUE-300) 61 % injection 30 mL (30 mLs Oral Contrast Given 04/13/17 1705)  iopamidol (ISOVUE-300) 61 % injection 100 mL (100 mLs Intravenous Contrast Given 04/13/17 1812)     NEW OUTPATIENT MEDICATIONS STARTED DURING THIS VISIT:  New Prescriptions   POLYETHYLENE GLYCOL (MIRALAX) PACKET    Take 17 g by mouth daily.    Modified Medications   No medications on file    Discontinued Medications   No medications on file     Note:  This document was prepared using Dragon voice recognition software and may include unintentional dictation errors.    Gregor Hams, MD 04/13/17 2118

## 2017-04-18 ENCOUNTER — Ambulatory Visit (INDEPENDENT_AMBULATORY_CARE_PROVIDER_SITE_OTHER): Payer: Medicare Other | Admitting: *Deleted

## 2017-04-18 DIAGNOSIS — I442 Atrioventricular block, complete: Secondary | ICD-10-CM

## 2017-04-18 LAB — CUP PACEART REMOTE DEVICE CHECK
Battery Remaining Longevity: 128 mo
Battery Remaining Percentage: 95.5 %
Battery Voltage: 3.01 V
Brady Statistic AP VP Percent: 1.1 %
Brady Statistic AP VS Percent: 6.5 %
Brady Statistic RA Percent Paced: 7.3 %
Date Time Interrogation Session: 20180517080014
Implantable Lead Implant Date: 20160928
Implantable Lead Location: 753860
Implantable Pulse Generator Implant Date: 20160928
Lead Channel Impedance Value: 450 Ohm
Lead Channel Pacing Threshold Amplitude: 0.75 V
Lead Channel Pacing Threshold Pulse Width: 0.4 ms
Lead Channel Sensing Intrinsic Amplitude: 4.6 mV
MDC IDC LEAD IMPLANT DT: 20160928
MDC IDC LEAD LOCATION: 753859
MDC IDC MSMT LEADCHNL RA IMPEDANCE VALUE: 430 Ohm
MDC IDC MSMT LEADCHNL RV PACING THRESHOLD AMPLITUDE: 0.75 V
MDC IDC MSMT LEADCHNL RV PACING THRESHOLD PULSEWIDTH: 0.4 ms
MDC IDC MSMT LEADCHNL RV SENSING INTR AMPL: 11.2 mV
MDC IDC PG SERIAL: 7779295
MDC IDC SET LEADCHNL RA PACING AMPLITUDE: 2 V
MDC IDC SET LEADCHNL RV PACING AMPLITUDE: 2.5 V
MDC IDC SET LEADCHNL RV PACING PULSEWIDTH: 0.4 ms
MDC IDC SET LEADCHNL RV SENSING SENSITIVITY: 2.5 mV
MDC IDC STAT BRADY AS VP PERCENT: 1.5 %
MDC IDC STAT BRADY AS VS PERCENT: 91 %
MDC IDC STAT BRADY RV PERCENT PACED: 2.5 %

## 2017-04-18 NOTE — Progress Notes (Signed)
Remote pacemaker transmission.   

## 2017-04-19 ENCOUNTER — Encounter: Payer: Self-pay | Admitting: Cardiology

## 2017-07-04 ENCOUNTER — Other Ambulatory Visit: Payer: Self-pay | Admitting: Cardiovascular Disease

## 2017-07-04 ENCOUNTER — Other Ambulatory Visit (HOSPITAL_COMMUNITY): Payer: Self-pay | Admitting: Psychiatry

## 2017-07-04 ENCOUNTER — Ambulatory Visit (HOSPITAL_COMMUNITY): Payer: 59 | Admitting: Psychiatry

## 2017-07-04 DIAGNOSIS — F331 Major depressive disorder, recurrent, moderate: Secondary | ICD-10-CM

## 2017-07-08 ENCOUNTER — Other Ambulatory Visit (HOSPITAL_COMMUNITY): Payer: Self-pay

## 2017-07-08 DIAGNOSIS — F331 Major depressive disorder, recurrent, moderate: Secondary | ICD-10-CM

## 2017-07-08 MED ORDER — PAROXETINE HCL 30 MG PO TABS: 30.0000 mg | ORAL_TABLET | Freq: Every day | ORAL | 0 refills | Status: DC

## 2017-07-08 MED ORDER — TRAZODONE HCL 50 MG PO TABS: 50.0000 mg | ORAL_TABLET | Freq: Every evening | ORAL | 0 refills | Status: DC | PRN

## 2017-07-08 MED ORDER — BUPROPION HCL ER (XL) 300 MG PO TB24: 300.0000 mg | ORAL_TABLET | Freq: Every day | ORAL | 0 refills | Status: DC

## 2017-07-08 MED ORDER — CLONAZEPAM 1 MG PO TABS: 1.0000 mg | ORAL_TABLET | Freq: Every day | ORAL | 0 refills | Status: DC

## 2017-07-08 NOTE — Progress Notes (Unsigned)
Patient's daughter

## 2017-07-16 ENCOUNTER — Telehealth: Payer: Self-pay | Admitting: Cardiovascular Disease

## 2017-07-16 NOTE — Telephone Encounter (Signed)
Please defer to neurology for refill, as they restarted medication

## 2017-07-16 NOTE — Telephone Encounter (Signed)
Please advise on refill refill request as clopidogrel was stopped by Dr Burt Knack at 10/19/16 office visit. Per 03/12/17 discharge summary, it was restarted per neurology. Thanks, MI

## 2017-07-16 NOTE — Telephone Encounter (Signed)
°*  STAT* If patient is at the pharmacy, call can be transferred to refill team.   1. Which medications need to be refilled? (please list name of each medication and dose if known) Plavix ( needs a new prescription sent )   2. Which pharmacy/location (including street and city if local pharmacy) is medication to be sent to?[facility] on [address] in [place]    3. Do they need a 30 day or 90 day supply?[place]

## 2017-07-17 NOTE — Telephone Encounter (Signed)
Spoke with patient and made her aware. She verbalized her understanding.

## 2017-07-18 ENCOUNTER — Ambulatory Visit (INDEPENDENT_AMBULATORY_CARE_PROVIDER_SITE_OTHER): Payer: Medicare Other | Admitting: *Deleted

## 2017-07-18 ENCOUNTER — Telehealth: Payer: Self-pay | Admitting: *Deleted

## 2017-07-18 DIAGNOSIS — I442 Atrioventricular block, complete: Secondary | ICD-10-CM

## 2017-07-18 NOTE — Telephone Encounter (Signed)
Advised patient that transmission was received automatically this AM at 0400.  Patient is appreciative and denies additional questions or concerns at this time.

## 2017-07-18 NOTE — Telephone Encounter (Signed)
°  1. Has your device fired? no  2. Is you device beeping?no   3. Are you experiencing draining or swelling at device site? no  4. Are you calling to see if we received your device transmission? yes  5. Have you passed out? No  Patient states that she is waiting on a call, to do her transmission

## 2017-07-19 NOTE — Progress Notes (Signed)
Remote pacemaker check. 

## 2017-07-26 LAB — CUP PACEART REMOTE DEVICE CHECK
Date Time Interrogation Session: 20180824163055
Implantable Lead Implant Date: 20160928
Implantable Lead Location: 753859
Lead Channel Impedance Value: 480 Ohm
Lead Channel Sensing Intrinsic Amplitude: 4.5 mV
Lead Channel Setting Pacing Amplitude: 2.5 V
Lead Channel Setting Pacing Pulse Width: 0.4 ms
MDC IDC LEAD IMPLANT DT: 20160928
MDC IDC LEAD LOCATION: 753860
MDC IDC MSMT LEADCHNL RA IMPEDANCE VALUE: 440 Ohm
MDC IDC MSMT LEADCHNL RV SENSING INTR AMPL: 12 mV
MDC IDC PG IMPLANT DT: 20160928
MDC IDC PG SERIAL: 7779295
MDC IDC SET LEADCHNL RA PACING AMPLITUDE: 2 V
MDC IDC SET LEADCHNL RV SENSING SENSITIVITY: 2.5 mV
MDC IDC STAT BRADY RA PERCENT PACED: 7.8 %
MDC IDC STAT BRADY RV PERCENT PACED: 2.3 %
Pulse Gen Model: 2240

## 2017-07-29 ENCOUNTER — Ambulatory Visit (INDEPENDENT_AMBULATORY_CARE_PROVIDER_SITE_OTHER): Payer: 59 | Admitting: Psychiatry

## 2017-07-29 ENCOUNTER — Encounter (HOSPITAL_COMMUNITY): Payer: Self-pay | Admitting: Psychiatry

## 2017-07-29 DIAGNOSIS — G4700 Insomnia, unspecified: Secondary | ICD-10-CM

## 2017-07-29 DIAGNOSIS — Z818 Family history of other mental and behavioral disorders: Secondary | ICD-10-CM

## 2017-07-29 DIAGNOSIS — F331 Major depressive disorder, recurrent, moderate: Secondary | ICD-10-CM

## 2017-07-29 MED ORDER — TRAZODONE HCL 50 MG PO TABS: ORAL_TABLET | ORAL | 2 refills | Status: DC

## 2017-07-29 MED ORDER — BUPROPION HCL ER (XL) 300 MG PO TB24: 300.0000 mg | ORAL_TABLET | Freq: Every day | ORAL | 2 refills | Status: DC

## 2017-07-29 MED ORDER — PAROXETINE HCL 30 MG PO TABS: 30.0000 mg | ORAL_TABLET | Freq: Every day | ORAL | 2 refills | Status: DC

## 2017-07-29 MED ORDER — CLONAZEPAM 1 MG PO TABS: 1.0000 mg | ORAL_TABLET | Freq: Every day | ORAL | 2 refills | Status: DC
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
## 2017-08-02 ENCOUNTER — Encounter: Payer: Self-pay | Admitting: Cardiology

## 2017-08-05 ENCOUNTER — Other Ambulatory Visit (HOSPITAL_COMMUNITY): Payer: Self-pay | Admitting: Psychiatry

## 2017-08-05 DIAGNOSIS — F331 Major depressive disorder, recurrent, moderate: Secondary | ICD-10-CM

## 2017-10-17 ENCOUNTER — Ambulatory Visit (INDEPENDENT_AMBULATORY_CARE_PROVIDER_SITE_OTHER): Payer: Medicare Other | Admitting: *Deleted

## 2017-10-17 DIAGNOSIS — K625 Hemorrhage of anus and rectum: Secondary | ICD-10-CM | POA: Insufficient documentation

## 2017-10-17 DIAGNOSIS — I442 Atrioventricular block, complete: Secondary | ICD-10-CM | POA: Diagnosis not present

## 2017-10-17 DIAGNOSIS — I350 Nonrheumatic aortic (valve) stenosis: Secondary | ICD-10-CM | POA: Insufficient documentation

## 2017-10-17 DIAGNOSIS — M359 Systemic involvement of connective tissue, unspecified: Secondary | ICD-10-CM | POA: Insufficient documentation

## 2017-10-17 DIAGNOSIS — K635 Polyp of colon: Secondary | ICD-10-CM | POA: Insufficient documentation

## 2017-10-17 DIAGNOSIS — E785 Hyperlipidemia, unspecified: Secondary | ICD-10-CM | POA: Insufficient documentation

## 2017-10-17 DIAGNOSIS — I2510 Atherosclerotic heart disease of native coronary artery without angina pectoris: Secondary | ICD-10-CM | POA: Insufficient documentation

## 2017-10-17 DIAGNOSIS — D649 Anemia, unspecified: Secondary | ICD-10-CM | POA: Insufficient documentation

## 2017-10-17 DIAGNOSIS — I10 Essential (primary) hypertension: Secondary | ICD-10-CM | POA: Insufficient documentation

## 2017-10-17 DIAGNOSIS — K529 Noninfective gastroenteritis and colitis, unspecified: Secondary | ICD-10-CM | POA: Insufficient documentation

## 2017-10-17 DIAGNOSIS — F329 Major depressive disorder, single episode, unspecified: Secondary | ICD-10-CM | POA: Insufficient documentation

## 2017-10-17 DIAGNOSIS — F32A Depression, unspecified: Secondary | ICD-10-CM | POA: Insufficient documentation

## 2017-10-17 DIAGNOSIS — E079 Disorder of thyroid, unspecified: Secondary | ICD-10-CM | POA: Insufficient documentation

## 2017-10-17 NOTE — Progress Notes (Signed)
Remote pacemaker transmission.   

## 2017-10-23 ENCOUNTER — Encounter: Payer: Self-pay | Admitting: Cardiology

## 2017-10-28 ENCOUNTER — Ambulatory Visit (HOSPITAL_COMMUNITY): Payer: Self-pay | Admitting: Psychiatry

## 2017-10-30 ENCOUNTER — Ambulatory Visit: Payer: Medicare Other | Admitting: Cardiovascular Disease

## 2017-10-30 ENCOUNTER — Other Ambulatory Visit (HOSPITAL_COMMUNITY): Payer: Self-pay

## 2017-10-30 DIAGNOSIS — F331 Major depressive disorder, recurrent, moderate: Secondary | ICD-10-CM

## 2017-10-30 MED ORDER — CLONAZEPAM 1 MG PO TABS: 1.0000 mg | ORAL_TABLET | Freq: Every day | ORAL | 0 refills | Status: DC

## 2017-10-30 MED ORDER — TRAZODONE HCL 50 MG PO TABS: ORAL_TABLET | ORAL | 0 refills | Status: DC

## 2017-10-30 MED ORDER — PAROXETINE HCL 30 MG PO TABS: 30.0000 mg | ORAL_TABLET | Freq: Every day | ORAL | 0 refills | Status: DC

## 2017-10-30 MED ORDER — BUPROPION HCL ER (XL) 300 MG PO TB24: 300.0000 mg | ORAL_TABLET | Freq: Every day | ORAL | 0 refills | Status: DC

## 2017-11-04 LAB — CUP PACEART REMOTE DEVICE CHECK
Brady Statistic AP VP Percent: 2 %
Brady Statistic AP VS Percent: 7.6 %
Brady Statistic AS VP Percent: 1.1 %
Brady Statistic AS VS Percent: 89 %
Brady Statistic RV Percent Paced: 3.1 %
Date Time Interrogation Session: 20181115090018
Implantable Lead Implant Date: 20160928
Implantable Lead Implant Date: 20160928
Implantable Lead Location: 753860
Implantable Pulse Generator Implant Date: 20160928
Lead Channel Impedance Value: 460 Ohm
Lead Channel Pacing Threshold Amplitude: 0.75 V
Lead Channel Pacing Threshold Pulse Width: 0.4 ms
Lead Channel Sensing Intrinsic Amplitude: 12 mV
Lead Channel Sensing Intrinsic Amplitude: 5 mV
Lead Channel Setting Pacing Amplitude: 2 V
Lead Channel Setting Sensing Sensitivity: 2.5 mV
MDC IDC LEAD LOCATION: 753859
MDC IDC MSMT BATTERY REMAINING LONGEVITY: 128 mo
MDC IDC MSMT BATTERY REMAINING PERCENTAGE: 95.5 %
MDC IDC MSMT BATTERY VOLTAGE: 2.99 V
MDC IDC MSMT LEADCHNL RA IMPEDANCE VALUE: 430 Ohm
MDC IDC MSMT LEADCHNL RA PACING THRESHOLD AMPLITUDE: 0.75 V
MDC IDC MSMT LEADCHNL RA PACING THRESHOLD PULSEWIDTH: 0.4 ms
MDC IDC PG SERIAL: 7779295
MDC IDC SET LEADCHNL RV PACING AMPLITUDE: 2.5 V
MDC IDC SET LEADCHNL RV PACING PULSEWIDTH: 0.4 ms
MDC IDC STAT BRADY RA PERCENT PACED: 9.5 %

## 2017-11-30 ENCOUNTER — Other Ambulatory Visit (HOSPITAL_COMMUNITY): Payer: Self-pay | Admitting: Psychiatry

## 2017-11-30 DIAGNOSIS — F331 Major depressive disorder, recurrent, moderate: Secondary | ICD-10-CM

## 2017-12-01 ENCOUNTER — Other Ambulatory Visit (HOSPITAL_COMMUNITY): Payer: Self-pay | Admitting: Psychiatry

## 2017-12-01 DIAGNOSIS — F331 Major depressive disorder, recurrent, moderate: Secondary | ICD-10-CM

## 2017-12-06 ENCOUNTER — Other Ambulatory Visit (HOSPITAL_COMMUNITY): Payer: Self-pay

## 2017-12-06 DIAGNOSIS — F331 Major depressive disorder, recurrent, moderate: Secondary | ICD-10-CM

## 2017-12-06 MED ORDER — BUPROPION HCL ER (XL) 300 MG PO TB24: 300.0000 mg | ORAL_TABLET | Freq: Every day | ORAL | 0 refills | Status: DC

## 2017-12-06 MED ORDER — PAROXETINE HCL 30 MG PO TABS: 30.0000 mg | ORAL_TABLET | Freq: Every day | ORAL | 0 refills | Status: DC

## 2017-12-06 MED ORDER — TRAZODONE HCL 50 MG PO TABS: ORAL_TABLET | ORAL | 0 refills | Status: DC

## 2017-12-06 MED ORDER — CLONAZEPAM 1 MG PO TABS: 1.0000 mg | ORAL_TABLET | Freq: Every day | ORAL | 0 refills | Status: DC

## 2017-12-07 ENCOUNTER — Other Ambulatory Visit: Payer: Self-pay | Admitting: Cardiovascular Disease

## 2017-12-17 ENCOUNTER — Encounter (HOSPITAL_COMMUNITY): Payer: Self-pay | Admitting: Psychiatry

## 2017-12-17 ENCOUNTER — Ambulatory Visit (HOSPITAL_COMMUNITY): Payer: 59 | Admitting: Psychiatry

## 2017-12-17 DIAGNOSIS — R262 Difficulty in walking, not elsewhere classified: Secondary | ICD-10-CM | POA: Diagnosis not present

## 2017-12-17 DIAGNOSIS — F331 Major depressive disorder, recurrent, moderate: Secondary | ICD-10-CM | POA: Diagnosis not present

## 2017-12-17 DIAGNOSIS — F419 Anxiety disorder, unspecified: Secondary | ICD-10-CM

## 2017-12-17 DIAGNOSIS — G8929 Other chronic pain: Secondary | ICD-10-CM | POA: Diagnosis not present

## 2017-12-17 DIAGNOSIS — Z915 Personal history of self-harm: Secondary | ICD-10-CM | POA: Diagnosis not present

## 2017-12-17 DIAGNOSIS — Z634 Disappearance and death of family member: Secondary | ICD-10-CM | POA: Diagnosis not present

## 2017-12-17 DIAGNOSIS — Z818 Family history of other mental and behavioral disorders: Secondary | ICD-10-CM | POA: Diagnosis not present

## 2017-12-17 DIAGNOSIS — Z79899 Other long term (current) drug therapy: Secondary | ICD-10-CM | POA: Diagnosis not present

## 2017-12-17 MED ORDER — PAROXETINE HCL 30 MG PO TABS: 30.0000 mg | ORAL_TABLET | Freq: Every day | ORAL | 0 refills | Status: DC

## 2017-12-17 MED ORDER — BUPROPION HCL ER (XL) 300 MG PO TB24: 300.0000 mg | ORAL_TABLET | Freq: Every day | ORAL | 0 refills | Status: DC

## 2017-12-17 MED ORDER — TRAZODONE HCL 50 MG PO TABS: ORAL_TABLET | ORAL | 0 refills | Status: DC

## 2017-12-17 MED ORDER — CLONAZEPAM 1 MG PO TABS: 1.0000 mg | ORAL_TABLET | Freq: Every day | ORAL | 0 refills | Status: DC
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
## 2017-12-28 ENCOUNTER — Other Ambulatory Visit (HOSPITAL_COMMUNITY): Payer: Self-pay | Admitting: Psychiatry

## 2017-12-28 DIAGNOSIS — F331 Major depressive disorder, recurrent, moderate: Secondary | ICD-10-CM

## 2018-01-02 ENCOUNTER — Other Ambulatory Visit (HOSPITAL_COMMUNITY): Payer: Self-pay | Admitting: Psychiatry

## 2018-01-16 ENCOUNTER — Ambulatory Visit (INDEPENDENT_AMBULATORY_CARE_PROVIDER_SITE_OTHER): Payer: Medicare Other | Admitting: *Deleted

## 2018-01-16 ENCOUNTER — Ambulatory Visit: Payer: Medicare Other | Admitting: Cardiovascular Disease

## 2018-01-16 DIAGNOSIS — I442 Atrioventricular block, complete: Secondary | ICD-10-CM

## 2018-01-16 NOTE — Progress Notes (Signed)
Remote pacemaker transmission.   

## 2018-01-22 ENCOUNTER — Encounter: Payer: Self-pay | Admitting: Cardiology

## 2018-01-30 ENCOUNTER — Other Ambulatory Visit (HOSPITAL_COMMUNITY): Payer: Self-pay | Admitting: Psychiatry

## 2018-01-30 DIAGNOSIS — F331 Major depressive disorder, recurrent, moderate: Secondary | ICD-10-CM

## 2018-01-31 LAB — CUP PACEART REMOTE DEVICE CHECK
Battery Remaining Longevity: 128 mo
Brady Statistic AP VS Percent: 7.1 %
Brady Statistic AS VP Percent: 1.4 %
Brady Statistic AS VS Percent: 90 %
Brady Statistic RA Percent Paced: 8.8 %
Implantable Lead Implant Date: 20160928
Implantable Lead Location: 753859
Implantable Lead Location: 753860
Lead Channel Impedance Value: 410 Ohm
Lead Channel Pacing Threshold Amplitude: 0.75 V
Lead Channel Sensing Intrinsic Amplitude: 12 mV
Lead Channel Setting Pacing Amplitude: 2 V
Lead Channel Setting Pacing Pulse Width: 0.4 ms
MDC IDC LEAD IMPLANT DT: 20160928
MDC IDC MSMT BATTERY REMAINING PERCENTAGE: 95.5 %
MDC IDC MSMT BATTERY VOLTAGE: 2.99 V
MDC IDC MSMT LEADCHNL RA PACING THRESHOLD AMPLITUDE: 0.75 V
MDC IDC MSMT LEADCHNL RA PACING THRESHOLD PULSEWIDTH: 0.4 ms
MDC IDC MSMT LEADCHNL RA SENSING INTR AMPL: 5 mV
MDC IDC MSMT LEADCHNL RV IMPEDANCE VALUE: 460 Ohm
MDC IDC MSMT LEADCHNL RV PACING THRESHOLD PULSEWIDTH: 0.4 ms
MDC IDC PG IMPLANT DT: 20160928
MDC IDC SESS DTM: 20190214100542
MDC IDC SET LEADCHNL RV PACING AMPLITUDE: 2.5 V
MDC IDC SET LEADCHNL RV SENSING SENSITIVITY: 2.5 mV
MDC IDC STAT BRADY AP VP PERCENT: 1.9 %
MDC IDC STAT BRADY RV PERCENT PACED: 3.3 %
Pulse Gen Model: 2240
Pulse Gen Serial Number: 7779295

## 2018-02-12 ENCOUNTER — Other Ambulatory Visit: Payer: Self-pay | Admitting: Cardiovascular Disease

## 2018-02-20 ENCOUNTER — Encounter: Payer: Self-pay | Admitting: Cardiovascular Disease

## 2018-02-20 ENCOUNTER — Ambulatory Visit: Payer: Medicare Other | Admitting: Cardiovascular Disease

## 2018-02-20 VITALS — BP 102/64 | HR 68 | Wt 194.6 lb

## 2018-02-20 DIAGNOSIS — E785 Hyperlipidemia, unspecified: Secondary | ICD-10-CM | POA: Diagnosis not present

## 2018-02-20 DIAGNOSIS — I10 Essential (primary) hypertension: Secondary | ICD-10-CM

## 2018-02-20 DIAGNOSIS — I359 Nonrheumatic aortic valve disorder, unspecified: Secondary | ICD-10-CM | POA: Diagnosis not present

## 2018-02-20 NOTE — Progress Notes (Signed)
Cardiology Office Note Date:  02/20/2018   ID:  Catherine [name], DOB 09-22-1941, MRN [id number]  PCP:  Jilda Panda, MD  Cardiologist:  Sherren Mocha, MD    Chief Complaint  Patient presents with  . Follow-up    aortic valve disease  . Shortness of Breath     History of Present Illness: Catherine [name] is a 77 y.o. adult who presents for follow-up of aortic valve disease.  She underwent TAVR in 2016.  Postoperatively she required a permanent pacemaker.  The patient is here with her husband today.  She is not to active anymore.  She has had a lot of trouble with depression over the past few years.  She denies chest pain or pressure, shortness of breath at rest, leg swelling, orthopnea, or PND.  She does have shortness of breath with physical activity which is not progressed over the past year.  She reports no changes in her medicines.  She does have some lightheadedness and when she has monitored her blood pressure at times of lightheadedness it has been elevated.  Past Medical History:  Diagnosis Date  . Acute colitis   . Anemia   . CAD (coronary artery disease)    a. heavy calcification of the entire LAD & mod eccentric mid LAD stenosis, estimated @ 70%, mild nonobs stenosis of RCA and LCx, severely calcified Ao valve with restricted valve mobility and 2+ AI    . Cancer [facility]) 1981   breast  . CHB (complete heart block) ([place]) 08/31/2015   [place] DR model [id number] (serial number [id number] )   . Chronic diastolic congestive heart failure ([place])    a. echo 08/31/2015: EF 65-70%, nl WM, GR1DD, Ao valve stent bioprosthesis was present and functioning nl, no regurg, LA mildly dilated, PASP 46 mm Hg  . Collagen vascular disease ([place])   . Colon polyps   . Depression   . Diabetes mellitus type II   . Fibromyalgia   . HTN (hypertension)   . Hypercholesteremia   . Hypothyroidism   . IBS (irritable bowel syndrome)   . Rectal bleeding   . Rheumatoid arthritis(714.0)   .  S/P TAVR (transcatheter aortic valve replacement) 08/30/2015   26 mm Edwards Sapien 3 transcatheter heart valve placed via open right transfemoral approach  . Shortness of breath dyspnea   . Stenosis of aortic valve   . Thyroid disease     Past Surgical History:  Procedure Laterality Date  . ABDOMINAL SURGERY     Intestinal surgery  . CARDIAC SURGERY    . CESAREAN SECTION     x 2  . EP IMPLANTABLE DEVICE N/A 08/31/2015   Procedure: Pacemaker Implant;  Surgeon: Will Meredith Leeds, MD; [place] (serial number [id number] ); Laterality: Right  . LEFT OOPHORECTOMY    . MASTECTOMY Left   . polyp     . self inflicted chest wound    . [place]   lower back  . TEE WITHOUT CARDIOVERSION N/A 08/30/2015   Procedure: TRANSESOPHAGEAL ECHOCARDIOGRAM (TEE);  Surgeon: Sherren Mocha, MD;  Location: [place];  Service: Open Heart Surgery;  Laterality: N/A;  . TOTAL ABDOMINAL HYSTERECTOMY    . TRANSCATHETER AORTIC VALVE REPLACEMENT, TRANSFEMORAL N/A 08/30/2015   Procedure: TRANSCATHETER AORTIC VALVE REPLACEMENT, TRANSFEMORAL;  Surgeon: Sherren Mocha, MD;  Location: [place];  Service: Open Heart Surgery;  Laterality: N/A;    Current Outpatient Medications  Medication Sig Dispense Refill  .  acetaminophen (TYLENOL) 325 MG tablet Take 650 mg by mouth every 6 (six) hours as needed for moderate pain.    [place] aspirin EC 81 MG tablet Take 81 mg by mouth daily.    Mar[place] Kitchen atorvastatin (LIPITOR) 40 MG tablet Take 1 tablet by mouth daily.    Marland Kitchen buPROPion (WELLBUTRIN XL) 300 MG 24 hr tablet Take 1 tablet (300 mg total) by mouth daily. 90 tablet 0  . clonazePAM (KLONOPIN) 1 MG tablet Take 1 tablet (1 mg total) by mouth at bedtime. 90 tablet 0  . docusate sodium (COLACE) 100 MG capsule Take 100 mg by mouth 2 (two) times daily.    Marland Kitchen KLOR-CON M10 10 MEQ tablet TAKE 1 TABLET BY MOUTH EVERY DAY 30 tablet 9  . levothyroxine (SYNTHROID, LEVOTHROID) 100 MCG tablet Take 100 mcg by mouth daily  before breakfast.     . Linagliptin-Metformin HCl (JENTADUETO) 2.5-500 MG TABS Take 0.5 tablets by mouth daily with breakfast.     . lisinopril (PRINIVIL,ZESTRIL) 10 MG tablet TAKE 1 TABLET (10 MG TOTAL) BY MOUTH DAILY. 30 tablet 11  . meloxicam (MOBIC) 7.5 MG tablet Take 1 tablet by mouth every other day.    . metoprolol tartrate (LOPRESSOR) 25 MG tablet Take 25 mg by mouth daily.     . Multiple Vitamin (MULTIVITAMIN WITH MINERALS) TABS tablet Take 1 tablet by mouth daily at 12 noon.     . ondansetron (ZOFRAN) 4 MG tablet Take 4 mg by mouth as needed for nausea or vomiting.    . pantoprazole (PROTONIX) 40 MG tablet Take 1 tablet (40 mg total) by mouth daily. Please keep upcoming appt before anymore refills. Thank you 15 tablet 0  . pantoprazole (PROTONIX) 40 MG tablet Take 40 mg by mouth daily.    Marland Kitchen PARoxetine (PAXIL) 30 MG tablet Take 1 tablet (30 mg total) by mouth daily. 90 tablet 0  . polyethylene glycol (MIRALAX) packet Take 17 g by mouth daily. 14 each 0  . tamsulosin (FLOMAX) 0.4 MG CAPS capsule Take 0.4 mg by mouth daily after breakfast.    . traZODone (DESYREL) 50 MG tablet TAKE 1/2 TABLET AT BED TIME. 15 tablet 1   No current facility-administered medications for this visit.     Allergies:   Sulfa antibiotics and Latex   Social History:  The patient  reports that she has never smoked. She has never used smokeless tobacco. She reports that she does not drink alcohol or use drugs.   Family History:  The patient's  family history includes Depression in her sister, sister, and sister; Hypertension in her mother and sister; Lung cancer in her sister; Stroke in her sister.    ROS:  Please see the history of present illness.  Otherwise, review of systems is positive for depression, muscle pain, constipation, anxiety, headaches.  All other systems are reviewed and negative.    PHYSICAL EXAM: VS:  BP 102/64   Pulse 68   Wt 194 lb 9.6 oz (88.3 kg)   SpO2 97%   BMI 35.59 kg/m  , BMI  Body mass index is 35.59 kg/m. GEN: Well nourished, well developed, in no acute distress  HEENT: normal  Neck: no JVD, no masses. No carotid bruits Cardiac: RRR without murmur or gallop                Respiratory:  clear to auscultation bilaterally, normal work of breathing GI: soft, nontender, nondistended, + BS MS: no deformity or atrophy  Ext: no pretibial edema  Skin: warm and dry, no rash Neuro:  Strength and sensation are intact Psych: euthymic mood, full affect  EKG:  EKG is ordered today. The ekg ordered today shows normal sinus rhythm 68 bpm, right bundle branch block  Recent Labs: 03/10/2017: Magnesium 1.9; TSH 7.993 04/13/2017: ALT 12; BUN 22; Creatinine, Ser 1.05; Hemoglobin 11.7; Platelets 233; Potassium 4.1; Sodium 135   Lipid Panel     Component Value Date/Time   CHOL 186 03/10/2017 0424   TRIG 147 03/10/2017 0424   HDL 43 03/10/2017 0424   CHOLHDL 4.3 03/10/2017 0424   VLDL 29 03/10/2017 0424   LDLCALC 114 (H) 03/10/2017 0424      Wt Readings from Last 3 Encounters:  02/20/18 194 lb 9.6 oz (88.3 kg)  04/13/17 190 lb (86.2 kg)  03/09/17 196 lb (88.9 kg)     Cardiac Studies Reviewed: 2D Echo 03-11-2017: Study Conclusions  - Left ventricle: The cavity size was normal. There was moderate   concentric hypertrophy. Systolic function was normal. The   estimated ejection fraction was in the range of 60% to 65%. Wall   motion was normal; there were no regional wall motion   abnormalities. Doppler parameters are consistent with abnormal   left ventricular relaxation (grade 1 diastolic dysfunction).   Doppler parameters are consistent with elevated ventricular   end-diastolic filling pressure. - Aortic valve: Transvalvular velocity was within the normal range.   There was no stenosis. There was no regurgitation. Mean gradient   (S): 4 mm Hg. Peak gradient (S): 8 mm Hg. Valve area (VTI): 2.64   cm^2. Valve area (Vmax): 2.48 cm^2. Valve area (Vmean): 3.06    cm^2. - Aortic root: The aortic root was normal in size. - Mitral valve: There was mild regurgitation. - Left atrium: The atrium was mildly dilated. - Right ventricle: Pacer wire or catheter noted in right ventricle. - Right atrium: Pacer wire or catheter noted in right atrium. - Tricuspid valve: There was trivial regurgitation. - Pulmonic valve: There was trivial regurgitation. - Inferior vena cava: The vessel was normal in size. - Pericardium, extracardiac: There was no pericardial effusion.  Impressions:  - LVEF 60-65%.   A TAVR bioprosthesis sits well in the aortic position. There are   normal transaortic gradients and unchaged from prior on   10/09/2016.   There is no central AI or paravalvular leak.  ASSESSMENT AND PLAN: 1.  Aortic valve disease status post TAVR: Most recent echo study is reviewed.  There is no murmur appreciable on exam and her transcatheter heart valve function by echo is normal.  She will continue on aspirin for antiplatelet therapy.  She is reminded about SBE prophylaxis.  2.  Complete AV block status post permanent pacemaker placement.  She has had normal pacemaker function on device checks.  3.  Type 2 diabetes: Managed by her primary care physician.  4.  Hypertension: Blood pressure appears to be well controlled on lisinopril and metoprolol.  No changes are made today.  5.  Hyperlipidemia: Treated with atorvastatin 40 mg daily.  Labs are followed by her primary physician.  Current medicines are reviewed with the patient today.  The patient does not have concerns regarding medicines.  Labs/ tests ordered today include:   Orders Placed This Encounter  Procedures  . EKG 12-Lead    Disposition:   FU one year  Signed, Sherren Mocha, MD  02/20/2018 3:51 PM    [facility] [place], [place]  [address] Phone: [phone]; Fax: [phone]

## 2018-02-20 NOTE — Patient Instructions (Signed)

## 2018-03-17 ENCOUNTER — Ambulatory Visit (HOSPITAL_COMMUNITY): Payer: Self-pay | Admitting: Psychiatry

## 2018-03-29 ENCOUNTER — Other Ambulatory Visit: Payer: Self-pay | Admitting: Cardiovascular Disease

## 2018-04-01 ENCOUNTER — Encounter (HOSPITAL_COMMUNITY): Payer: Self-pay | Admitting: Psychiatry

## 2018-04-01 ENCOUNTER — Ambulatory Visit (HOSPITAL_COMMUNITY): Payer: Self-pay | Admitting: Psychiatry

## 2018-04-01 ENCOUNTER — Other Ambulatory Visit: Payer: Self-pay

## 2018-04-01 ENCOUNTER — Other Ambulatory Visit (HOSPITAL_COMMUNITY): Payer: Self-pay | Admitting: Psychiatry

## 2018-04-01 ENCOUNTER — Emergency Department
Admission: EM | Admit: 2018-04-01 | Discharge: 2018-04-01 | Disposition: A | Discharge status: Home / Self Care | Payer: Medicare Other | Attending: Emergency Medicine | Admitting: Emergency Medicine

## 2018-04-01 DIAGNOSIS — I2510 Atherosclerotic heart disease of native coronary artery without angina pectoris: Secondary | ICD-10-CM | POA: Diagnosis not present

## 2018-04-01 DIAGNOSIS — E039 Hypothyroidism, unspecified: Secondary | ICD-10-CM | POA: Insufficient documentation

## 2018-04-01 DIAGNOSIS — I5032 Chronic diastolic (congestive) heart failure: Secondary | ICD-10-CM | POA: Insufficient documentation

## 2018-04-01 DIAGNOSIS — F331 Major depressive disorder, recurrent, moderate: Secondary | ICD-10-CM

## 2018-04-01 DIAGNOSIS — Z915 Personal history of self-harm: Secondary | ICD-10-CM

## 2018-04-01 DIAGNOSIS — Z7982 Long term (current) use of aspirin: Secondary | ICD-10-CM | POA: Diagnosis not present

## 2018-04-01 DIAGNOSIS — Z79899 Other long term (current) drug therapy: Secondary | ICD-10-CM | POA: Diagnosis not present

## 2018-04-01 DIAGNOSIS — N3090 Cystitis, unspecified without hematuria: Secondary | ICD-10-CM | POA: Diagnosis not present

## 2018-04-01 DIAGNOSIS — Z818 Family history of other mental and behavioral disorders: Secondary | ICD-10-CM

## 2018-04-01 DIAGNOSIS — I110 Hypertensive heart disease with heart failure: Secondary | ICD-10-CM | POA: Diagnosis not present

## 2018-04-01 DIAGNOSIS — E119 Type 2 diabetes mellitus without complications: Secondary | ICD-10-CM | POA: Insufficient documentation

## 2018-04-01 DIAGNOSIS — R339 Retention of urine, unspecified: Secondary | ICD-10-CM | POA: Diagnosis present

## 2018-04-01 DIAGNOSIS — Z7984 Long term (current) use of oral hypoglycemic drugs: Secondary | ICD-10-CM | POA: Diagnosis not present

## 2018-04-01 DIAGNOSIS — F419 Anxiety disorder, unspecified: Secondary | ICD-10-CM

## 2018-04-01 LAB — CBC
HCT: 35.1 % (ref 35.0–47.0)
HEMOGLOBIN: 11.8 g/dL — AB (ref 12.0–16.0)
MCH: 28.7 pg (ref 26.0–34.0)
MCHC: 33.5 g/dL (ref 32.0–36.0)
MCV: 85.7 fL (ref 80.0–100.0)
Platelets: 304 10*3/uL (ref 150–440)
RBC: 4.1 MIL/uL (ref 3.80–5.20)
RDW: 15.4 % — ABNORMAL HIGH (ref 11.5–14.5)
WBC: 10.7 10*3/uL (ref 3.6–11.0)

## 2018-04-01 LAB — BASIC METABOLIC PANEL
ANION GAP: 9 (ref 5–15)
BUN: 20 mg/dL (ref 6–20)
CHLORIDE: 100 mmol/L — AB (ref 101–111)
CO2: 26 mmol/L (ref 22–32)
Calcium: 9.8 mg/dL (ref 8.9–10.3)
Creatinine, Ser: 1.57 mg/dL — ABNORMAL HIGH (ref 0.44–1.00)
GFR calc non Af Amer: 30 mL/min — ABNORMAL LOW (ref >60)
GFR, EST AFRICAN AMERICAN: 35 mL/min — AB (ref >60)
GLUCOSE: 93 mg/dL (ref 65–99)
Potassium: 3.8 mmol/L (ref 3.5–5.1)
Sodium: 135 mmol/L (ref 135–145)

## 2018-04-01 LAB — URINALYSIS, COMPLETE (UACMP) WITH MICROSCOPIC
BILIRUBIN URINE: NEGATIVE
Glucose, UA: NEGATIVE mg/dL
Hgb urine dipstick: NEGATIVE
Ketones, ur: 5 mg/dL — AB
Leukocytes, UA: NEGATIVE
Nitrite: NEGATIVE
PH: 6 (ref 5.0–8.0)
Protein, ur: 100 mg/dL — AB
SPECIFIC GRAVITY, URINE: 1.025 (ref 1.005–1.030)

## 2018-04-01 MED ORDER — PAROXETINE HCL 30 MG PO TABS: 30.0000 mg | ORAL_TABLET | Freq: Every day | ORAL | 0 refills | Status: DC

## 2018-04-01 MED ORDER — BUPROPION HCL ER (XL) 300 MG PO TB24: 300.0000 mg | ORAL_TABLET | Freq: Every day | ORAL | 0 refills | Status: DC

## 2018-04-01 MED ORDER — CEPHALEXIN 500 MG PO CAPS: 500.0000 mg | ORAL_CAPSULE | Freq: Three times a day (TID) | ORAL | 0 refills | Status: AC

## 2018-04-01 MED ORDER — CLONAZEPAM 1 MG PO TABS: 1.0000 mg | ORAL_TABLET | Freq: Every day | ORAL | 0 refills | Status: DC

## 2018-04-01 MED ORDER — CEPHALEXIN 500 MG PO CAPS
500.0000 mg | ORAL_CAPSULE | Freq: Once | ORAL | Status: AC
  Administered 2018-04-01: 500 mg via ORAL
  Filled 2018-04-01: qty 1

## 2018-04-01 MED ORDER — TRAZODONE HCL 50 MG PO TABS: ORAL_TABLET | ORAL | 2 refills | Status: DC

## 2018-04-01 NOTE — ED Provider Notes (Signed)
[facility] Emergency Department Provider Note  ___________________________________________   First MD Initiated Contact with Patient 04/01/18 1633     (approximate)  I have reviewed the triage vital signs and the nursing notes.   HISTORY  Chief Complaint Urinary Retention   HPI Catherine [name] is a 77 y.o. adult with a history of cystitis who is presenting to the emergency department with frequency as well as dribbling of her urine that started today.  Says that she also some lower abdominal burning and cramping.  Does not report any nausea vomiting or diarrhea.  Says that she feels like she cannot empty her bladder.  Had a volume of 30 mL's on the bladder scan from triage.  Past Medical History:  Diagnosis Date  . Acute colitis   . Anemia   . CAD (coronary artery disease)    a. heavy calcification of the entire LAD & mod eccentric mid LAD stenosis, estimated @ 70%, mild nonobs stenosis of RCA and LCx, severely calcified Ao valve with restricted valve mobility and 2+ AI    . Cancer [facility]) 1981   breast  . CHB (complete heart block) ([place]) 08/31/2015   [place] DR model [id number] (serial number [id number] )   . Chronic diastolic congestive heart failure ([place])    a. echo 08/31/2015: EF 65-70%, nl WM, GR1DD, Ao valve stent bioprosthesis was present and functioning nl, no regurg, LA mildly dilated, PASP 46 mm Hg  . Collagen vascular disease ([place])   . Colon polyps   . Depression   . Diabetes mellitus type II   . Fibromyalgia   . HTN (hypertension)   . Hypercholesteremia   . Hypothyroidism   . IBS (irritable bowel syndrome)   . Rectal bleeding   . Rheumatoid arthritis(714.0)   . S/P TAVR (transcatheter aortic valve replacement) 08/30/2015   26 mm Edwards Sapien 3 transcatheter heart valve placed via open right transfemoral approach  . Shortness of breath dyspnea   . Stenosis of aortic valve   . Thyroid disease     Patient Active Problem  List   Diagnosis Date Noted  . Thyroid disease   . Stenosis of aortic valve   . Rectal bleeding   . Hypercholesteremia   . HTN (hypertension)   . Depression   . Colon polyps   . Collagen vascular disease ([place])   . CAD (coronary artery disease)   . Anemia   . Acute colitis   . Abdominal pain   . BPPV (benign paroxysmal positional vertigo), bilateral   . CVA (cerebral vascular accident) ([employer])   . TIA (transient ischemic attack) 03/09/2017  . Dizziness   . Suicidal ideation 03/06/2016  . Severe recurrent major depression without psychotic features ([place])   . Chest pain 09/24/2015  . Hypokalemia 09/07/2015  . IBS (irritable bowel syndrome)   . Cancer ([place])   . Pneumothorax 09/05/2015  . Complete heart block ([place])   . CHB (complete heart block) ([place]) 08/31/2015  . S/P TAVR (transcatheter aortic valve replacement) 08/30/2015  . Type II diabetes mellitus ([place])   . Essential hypertension   . Chronic diastolic congestive heart failure ([place])   . Rheumatoid arthritis ([place])   . Hypothyroidism   . Fibromyalgia   . Aortic valve stenosis, critical 08/22/2015  . MDD (major depressive disorder) ([place]) 05/26/2012    Past Surgical History:  Procedure Laterality Date  . ABDOMINAL SURGERY     Intestinal surgery  . CARDIAC SURGERY    .  CESAREAN SECTION     x 2  . EP IMPLANTABLE DEVICE N/A 08/31/2015   Procedure: Pacemaker Implant;  Surgeon: Will Meredith Leeds, MD; [place] (serial number [id number] ); Laterality: Right  . LEFT OOPHORECTOMY    . MASTECTOMY Left   . polyp     . self inflicted chest wound    . [place]   lower back  . TEE WITHOUT CARDIOVERSION N/A 08/30/2015   Procedure: TRANSESOPHAGEAL ECHOCARDIOGRAM (TEE);  Surgeon: Sherren Mocha, MD;  Location: [place];  Service: Open Heart Surgery;  Laterality: N/A;  . TOTAL ABDOMINAL HYSTERECTOMY    . TRANSCATHETER AORTIC VALVE REPLACEMENT, TRANSFEMORAL N/A 08/30/2015   Procedure:  TRANSCATHETER AORTIC VALVE REPLACEMENT, TRANSFEMORAL;  Surgeon: Sherren Mocha, MD;  Location: [place];  Service: Open Heart Surgery;  Laterality: N/A;    Prior to Admission medications   Medication Sig Start Date End Date Taking? Authorizing Provider  aspirin EC 81 MG tablet Take 81 mg by mouth daily.    [provider]  atorvastatin (LIPITOR) 40 MG tablet Take 1 tablet by mouth daily. 01/01/18   [provider]  buPROPion (WELLBUTRIN XL) 300 MG 24 hr tablet Take 1 tablet (300 mg total) by mouth daily. 04/01/18   Arfeen, Arlyce Harman, MD  clonazePAM (KLONOPIN) 1 MG tablet Take 1 tablet (1 mg total) by mouth at bedtime. 04/01/18   Arfeen, Arlyce Harman, MD  docusate sodium (COLACE) 100 MG capsule Take 100 mg by mouth 2 (two) times daily.    [provider]  KLOR-CON M10 10 MEQ tablet TAKE 1 TABLET BY MOUTH EVERY DAY 10/01/16   Bhagat, Bhavinkumar, PA  levothyroxine (SYNTHROID, LEVOTHROID) 100 MCG tablet Take 100 mcg by mouth daily before breakfast.     [provider]  Linagliptin-Metformin HCl (JENTADUETO) 2.5-500 MG TABS Take 0.5 tablets by mouth daily with breakfast.     [provider]  lisinopril (PRINIVIL,ZESTRIL) 10 MG tablet TAKE 1 TABLET (10 MG TOTAL) BY MOUTH DAILY. 12/05/16   Sherren Mocha, MD  metoprolol tartrate (LOPRESSOR) 25 MG tablet Take 25 mg by mouth daily.     [provider]  Multiple Vitamin (MULTIVITAMIN WITH MINERALS) TABS tablet Take 1 tablet by mouth daily at 12 noon.     [provider]  ondansetron (ZOFRAN) 4 MG tablet Take 4 mg by mouth as needed for nausea or vomiting.    [provider]  pantoprazole (PROTONIX) 40 MG tablet Take 1 tablet (40 mg total) by mouth daily. 03/31/18   Belva Crome, MD  PARoxetine (PAXIL) 30 MG tablet Take 1 tablet (30 mg total) by mouth daily. 04/01/18   Arfeen, Arlyce Harman, MD  polyethylene glycol [facility]) packet Take 17 g by mouth daily. 04/13/17   Gregor Hams, MD  tamsulosin  (FLOMAX) 0.4 MG CAPS capsule Take 0.4 mg by mouth daily after breakfast.    [provider]  traZODone (DESYREL) 50 MG tablet TAKE 1/2 TABLET AT BED TIME. 04/01/18   Arfeen, Arlyce Harman, MD    Allergies Sulfa antibiotics and Latex  Family History  Problem Relation Age of Onset  . Depression Sister   . Depression Sister   . Depression Sister   . Hypertension Mother   . Lung cancer Sister   . Stroke Sister   . Hypertension Sister   . Heart attack Neg Hx     Social History Social History   Tobacco Use  . Smoking status: Never  Smoker  . Smokeless tobacco: Never Used  Substance Use Topics  . Alcohol use: No    Alcohol/week: 0.0 oz  . Drug use: No    Review of Systems  Constitutional: No fever/chills Eyes: No visual changes. ENT: No sore throat. Cardiovascular: Denies chest pain. Respiratory: Denies shortness of breath. Gastrointestinal:  No nausea, no vomiting.  No diarrhea.  No constipation. Genitourinary: As above Musculoskeletal: Negative for back pain. Skin: Negative for rash. Neurological: Negative for headaches, focal weakness or numbness.   ____________________________________________   PHYSICAL EXAM:  VITAL SIGNS: ED Triage Vitals  Enc Vitals Group     BP 04/01/18 1607 128/87     Pulse Rate 04/01/18 1607 71     Resp 04/01/18 1607 20     Temp 04/01/18 1607 98.4 F (36.9 C)     Temp Source 04/01/18 1607 Oral     SpO2 04/01/18 1607 98 %     Weight 04/01/18 1608 198 lb (89.8 kg)     Height 04/01/18 1608 5\' 3"  (1.6 m)     Head Circumference --      Peak Flow --      Pain Score 04/01/18 1607 8     Pain Loc --      Pain Edu? --      Excl. in [place]? --     Constitutional: Alert and oriented. Well appearing and in no acute distress. Eyes: Conjunctivae are normal.  Head: Atraumatic. Nose: No congestion/rhinnorhea. Mouth/Throat: Mucous membranes are moist.  Neck: No stridor.   Cardiovascular: Normal rate, regular rhythm. Grossly normal heart sounds.    Respiratory: Normal respiratory effort.  No retractions. Lungs CTAB. Gastrointestinal: Soft and nontender. No distention.  Musculoskeletal: No lower extremity tenderness nor edema.  No joint effusions. Neurologic:  Normal speech and language. No gross focal neurologic deficits are appreciated. Skin:  Skin is warm, dry and intact. No rash noted. Psychiatric: Mood and affect are normal. Speech and behavior are normal.  ____________________________________________   LABS (all labs ordered are listed, but only abnormal results are displayed)  Labs Reviewed  URINALYSIS, COMPLETE (UACMP) WITH MICROSCOPIC - Abnormal; Notable for the following components:      Result Value   Color, Urine AMBER (*)    APPearance HAZY (*)    Ketones, ur 5 (*)    Protein, ur 100 (*)    Bacteria, UA RARE (*)    All other components within normal limits  BASIC METABOLIC PANEL - Abnormal; Notable for the following components:   Chloride 100 (*)    Creatinine, Ser 1.57 (*)    GFR calc non Af Amer 30 (*)    GFR calc Af Amer 35 (*)    All other components within normal limits  CBC - Abnormal; Notable for the following components:   Hemoglobin 11.8 (*)    RDW 15.4 (*)    All other components within normal limits  URINE CULTURE   ____________________________________________  EKG   ____________________________________________  RADIOLOGY   ____________________________________________   PROCEDURES  Procedure(s) performed:   Procedures  Critical Care performed:   ____________________________________________   INITIAL IMPRESSION / ASSESSMENT AND PLAN / ED COURSE  Pertinent labs & imaging results that were available during my care of the patient were reviewed by me and considered in my medical decision making (see chart for details).  Differential diagnosis includes, but is not limited to, ovarian cyst, ovarian torsion, acute appendicitis, diverticulitis, urinary tract  infection/pyelonephritis, endometriosis, bowel obstruction, colitis, renal colic, gastroenteritis, hernia,  fibroids, endometriosis, pregnancy related pain including ectopic pregnancy, etc. As part of my medical decision making, I reviewed the following data within the [facility] Notes from prior ED visits  ----------------------------------------- 6:08 PM on 04/01/2018 -----------------------------------------  Patient at this time without distress.  Bacteria found in the urine.  No white blood cells.  However, patient's history consistent with UTI.  She will be treated with Keflex.  We will send a urine culture.  I will refer her to urology because of her past urologic history of cystitis.  She is understanding of the diagnosis as well as treatment plan willing to comply. ____________________________________________   FINAL CLINICAL IMPRESSION(S) / ED DIAGNOSES  Cystitis.    NEW MEDICATIONS STARTED DURING THIS VISIT:  New Prescriptions   No medications on file     Note:  This document was prepared using Dragon voice recognition software and may include unintentional dictation errors.     Orbie Pyo, MD 04/01/18 [phone]

## 2018-04-01 NOTE — ED Triage Notes (Signed)
Pt in via POV with complaints urinary retention x one day, reports dribbling today but no significant output.  Pt reports hx of interstitial cystitis.  Vitals WDL, NAD noted at this time.
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
## 2018-04-02 LAB — URINE CULTURE: CULTURE: NO GROWTH

## 2018-04-08 ENCOUNTER — Ambulatory Visit (INDEPENDENT_AMBULATORY_CARE_PROVIDER_SITE_OTHER): Payer: Medicare Other | Admitting: Urology

## 2018-04-08 ENCOUNTER — Encounter: Payer: Self-pay | Admitting: Urology

## 2018-04-08 VITALS — BP 105/63 | HR 70 | Resp 16 | Ht 63.0 in | Wt 204.8 lb

## 2018-04-08 DIAGNOSIS — N3020 Other chronic cystitis without hematuria: Secondary | ICD-10-CM

## 2018-04-08 DIAGNOSIS — N390 Urinary tract infection, site not specified: Secondary | ICD-10-CM

## 2018-04-08 LAB — MICROSCOPIC EXAMINATION
RBC, UA: NONE SEEN /hpf (ref 0–2)
WBC UA: NONE SEEN /HPF (ref 0–5)

## 2018-04-08 LAB — URINALYSIS, COMPLETE
Bilirubin, UA: NEGATIVE
Glucose, UA: NEGATIVE
Ketones, UA: NEGATIVE
Leukocytes, UA: NEGATIVE
Nitrite, UA: NEGATIVE
PH UA: 5.5 (ref 5.0–7.5)
PROTEIN UA: NEGATIVE
RBC, UA: NEGATIVE
Specific Gravity, UA: 1.02 (ref 1.005–1.030)
UUROB: 0.2 mg/dL (ref 0.2–1.0)

## 2018-04-08 LAB — BLADDER SCAN AMB NON-IMAGING

## 2018-04-08 NOTE — Progress Notes (Signed)
   04/08/2018 3:09 PM   TWYLAH BENNETTS 1941/09/26 [id number]  Due to an emergency in the OR, this patient was rescheduled for later this week.    Hollice Espy, MD

## 2018-04-14 ENCOUNTER — Other Ambulatory Visit (HOSPITAL_COMMUNITY): Payer: Self-pay | Admitting: Psychiatry

## 2018-04-14 DIAGNOSIS — F331 Major depressive disorder, recurrent, moderate: Secondary | ICD-10-CM

## 2018-04-17 ENCOUNTER — Ambulatory Visit (INDEPENDENT_AMBULATORY_CARE_PROVIDER_SITE_OTHER): Payer: Medicare Other | Admitting: *Deleted

## 2018-04-17 DIAGNOSIS — I442 Atrioventricular block, complete: Secondary | ICD-10-CM

## 2018-04-17 NOTE — Progress Notes (Signed)
Remote pacemaker transmission.   

## 2018-04-18 ENCOUNTER — Encounter: Payer: Self-pay | Admitting: Cardiology

## 2018-04-21 ENCOUNTER — Other Ambulatory Visit (HOSPITAL_COMMUNITY): Payer: Self-pay | Admitting: Psychiatry

## 2018-04-29 LAB — CUP PACEART REMOTE DEVICE CHECK
Battery Remaining Longevity: 128 mo
Battery Voltage: 2.99 V
Brady Statistic AP VS Percent: 6.7 %
Brady Statistic AS VP Percent: 1.9 %
Brady Statistic RV Percent Paced: 3.9 %
Date Time Interrogation Session: 20190516080031
Implantable Lead Location: 753859
Lead Channel Impedance Value: 430 Ohm
Lead Channel Pacing Threshold Amplitude: 0.75 V
Lead Channel Sensing Intrinsic Amplitude: 12 mV
Lead Channel Setting Pacing Amplitude: 2 V
Lead Channel Setting Pacing Amplitude: 2.5 V
Lead Channel Setting Pacing Pulse Width: 0.4 ms
MDC IDC LEAD IMPLANT DT: 20160928
MDC IDC LEAD IMPLANT DT: 20160928
MDC IDC LEAD LOCATION: 753860
MDC IDC MSMT BATTERY REMAINING PERCENTAGE: 95.5 %
MDC IDC MSMT LEADCHNL RA PACING THRESHOLD AMPLITUDE: 0.75 V
MDC IDC MSMT LEADCHNL RA PACING THRESHOLD PULSEWIDTH: 0.4 ms
MDC IDC MSMT LEADCHNL RA SENSING INTR AMPL: 5 mV
MDC IDC MSMT LEADCHNL RV IMPEDANCE VALUE: 480 Ohm
MDC IDC MSMT LEADCHNL RV PACING THRESHOLD PULSEWIDTH: 0.4 ms
MDC IDC PG IMPLANT DT: 20160928
MDC IDC SET LEADCHNL RV SENSING SENSITIVITY: 2.5 mV
MDC IDC STAT BRADY AP VP PERCENT: 2 %
MDC IDC STAT BRADY AS VS PERCENT: 89 %
MDC IDC STAT BRADY RA PERCENT PACED: 8.6 %
Pulse Gen Model: 2240
Pulse Gen Serial Number: 7779295

## 2018-05-13 ENCOUNTER — Ambulatory Visit: Payer: Self-pay | Admitting: Urology

## 2018-05-16 ENCOUNTER — Other Ambulatory Visit (HOSPITAL_COMMUNITY): Payer: Self-pay | Admitting: Psychiatry

## 2018-05-16 DIAGNOSIS — F331 Major depressive disorder, recurrent, moderate: Secondary | ICD-10-CM

## 2018-06-02 NOTE — Progress Notes (Signed)
06/03/2018 3:48 PM   [name] Oct 29, 1941 [id number]  Referring provider: Jilda Panda, MD [address], [place] [address]  Chief Complaint  Patient presents with  . Cystitis    HPI: Patient is a 77 year old Caucasian female who was seen recently at [facility] ED for frequency and urinary dribbling with her daughter, [name].    She presented to the emergency room on April 01, 2018 with a complaint of urinary frequency and dribbling.  Her PVR at that time was 30 mL.  Her UA was positive for 0-5 RBCs and 0-5 WBCs.  Urine culture was negative.  She was given Keflex and treated for presumptive UTI.  Contrast CT in 04/2017 noted no evidence of bowel obstruction. Appendix is not discretely visualized.  Mild to moderate left colonic stool burden, suggesting constipation.  2 mm nonobstructing right lower pole renal calculus. No hydronephrosis.  14 mm hemorrhagic right lower pole renal cyst, benign (Bosniak II).  Reviewing her records,  she has had no documented positive urine cultures.    She is having strong urgency, very rare dysuria, nocturia x 1, incontinence when she is on her feet a lot and hesitancy.  Patient denies any gross hematuria, dysuria or suprapubic/flank pain.  Patient denies any fevers, chills, nausea or vomiting.     She does have a history of nephrolithiasis.  She has not had GU surgery or GU trauma.   She is postmenopausal.  She has a history of breast cancer.    She admits to constipation and/or diarrhea.   She is drinking 3 cups of water daily.  She is drinking orange/pineapple soda.  She drinks pineapple and grape juice.  No coffee.  No alcohol.  No tea.    She was started on Myrbetriq 50 mg daily and states she has seen good improvement with her urinary symptoms.  She states she can go better.    PMH: Past Medical History:  Diagnosis Date  . Acute colitis   . Anemia   . CAD (coronary artery disease)    a. heavy calcification of the entire LAD &  mod eccentric mid LAD stenosis, estimated @ 70%, mild nonobs stenosis of RCA and LCx, severely calcified Ao valve with restricted valve mobility and 2+ AI    . Cancer [facility]) 1981   breast  . CHB (complete heart block) ([place]) 08/31/2015   [place] DR model [id number] (serial number [id number] )   . Chronic diastolic congestive heart failure ([place])    a. echo 08/31/2015: EF 65-70%, nl WM, GR1DD, Ao valve stent bioprosthesis was present and functioning nl, no regurg, LA mildly dilated, PASP 46 mm Hg  . Collagen vascular disease ([place])   . Colon polyps   . Depression   . Diabetes mellitus type II   . Fibromyalgia   . HTN (hypertension)   . Hypercholesteremia   . Hypothyroidism   . IBS (irritable bowel syndrome)   . Rectal bleeding   . Rheumatoid arthritis(714.0)   . S/P TAVR (transcatheter aortic valve replacement) 08/30/2015   26 mm Edwards Sapien 3 transcatheter heart valve placed via open right transfemoral approach  . Shortness of breath dyspnea   . Stenosis of aortic valve   . Thyroid disease     Surgical History: Past Surgical History:  Procedure Laterality Date  . ABDOMINAL SURGERY     Intestinal surgery  . CARDIAC SURGERY    . CESAREAN SECTION     x 2  . EP  IMPLANTABLE DEVICE N/A 08/31/2015   Procedure: Pacemaker Implant;  Surgeon: Will Meredith Leeds, MD; [place] (serial number [id number] ); Laterality: Right  . LEFT OOPHORECTOMY    . MASTECTOMY Left   . polyp     . self inflicted chest wound    . [place]   lower back  . TEE WITHOUT CARDIOVERSION N/A 08/30/2015   Procedure: TRANSESOPHAGEAL ECHOCARDIOGRAM (TEE);  Surgeon: Sherren Mocha, MD;  Location: [place];  Service: Open Heart Surgery;  Laterality: N/A;  . TOTAL ABDOMINAL HYSTERECTOMY    . TRANSCATHETER AORTIC VALVE REPLACEMENT, TRANSFEMORAL N/A 08/30/2015   Procedure: TRANSCATHETER AORTIC VALVE REPLACEMENT, TRANSFEMORAL;  Surgeon: Sherren Mocha, MD;  Location: [place];   Service: Open Heart Surgery;  Laterality: N/A;    Home Medications:  Allergies as of 06/03/2018      Reactions   Sulfa Antibiotics Hives   Latex Rash      Medication List        Accurate as of 06/03/18  3:48 PM. Always use your most recent med list.          aspirin EC 81 MG tablet Take 81 mg by mouth daily.   atorvastatin 40 MG tablet Commonly known as:  LIPITOR Take 1 tablet by mouth daily.   buPROPion 300 MG 24 hr tablet Commonly known as:  WELLBUTRIN XL Take 1 tablet (300 mg total) by mouth daily.   clonazePAM 1 MG tablet Commonly known as:  KLONOPIN Take 1 tablet (1 mg total) by mouth at bedtime.   docusate sodium 100 MG capsule Commonly known as:  COLACE Take 100 mg by mouth 2 (two) times daily.   JENTADUETO 2.5-500 MG Tabs Generic drug:  linaGLIPtin-metFORMIN HCl Take 0.5 tablets by mouth daily with breakfast.   KLOR-CON M10 10 MEQ tablet Generic drug:  potassium chloride TAKE 1 TABLET BY MOUTH EVERY DAY   levothyroxine 100 MCG tablet Commonly known as:  SYNTHROID, LEVOTHROID Take 100 mcg by mouth daily before breakfast.   lisinopril 10 MG tablet Commonly known as:  PRINIVIL,ZESTRIL TAKE 1 TABLET (10 MG TOTAL) BY MOUTH DAILY.   metoprolol tartrate 25 MG tablet Commonly known as:  LOPRESSOR Take 25 mg by mouth daily.   mirabegron ER 50 MG Tb24 tablet Commonly known as:  MYRBETRIQ Take 1 tablet (50 mg total) by mouth daily.   multivitamin with minerals Tabs tablet Take 1 tablet by mouth daily at 12 noon.   ondansetron 4 MG tablet Commonly known as:  ZOFRAN Take 4 mg by mouth as needed for nausea or vomiting.   pantoprazole 40 MG tablet Commonly known as:  PROTONIX Take 1 tablet (40 mg total) by mouth daily.   PARoxetine 30 MG tablet Commonly known as:  PAXIL Take 1 tablet (30 mg total) by mouth daily.   polyethylene glycol packet Commonly known as:  MIRALAX Take 17 g by mouth daily.   tamsulosin 0.4 MG Caps capsule Commonly known as:   FLOMAX Take 0.4 mg by mouth daily after breakfast.   traZODone 50 MG tablet Commonly known as:  DESYREL TAKE 1/2 TABLET AT BED TIME.       Allergies:  Allergies  Allergen Reactions  . Sulfa Antibiotics Hives  . Latex Rash    Family History: Family History  Problem Relation Age of Onset  . Depression Sister   . Depression Sister   . Depression Sister   . Hypertension Mother   . Lung cancer Sister   .  Stroke Sister   . Hypertension Sister   . Heart attack Neg Hx     Social History:  reports that she has never smoked. She has never used smokeless tobacco. She reports that she does not drink alcohol or use drugs.  ROS: UROLOGY Frequent Urination?: No Hard to postpone urination?: Yes Burning/pain with urination?: Yes Get up at night to urinate?: Yes Leakage of urine?: Yes Urine stream starts and stops?: No Trouble starting stream?: Yes Do you have to strain to urinate?: No Blood in urine?: No Urinary tract infection?: No Sexually transmitted disease?: No Injury to kidneys or bladder?: No Painful intercourse?: No Weak stream?: No Erection problems?: No Penile pain?: No Currently pregnant?: No Vaginal bleeding?: No Last menstrual period?: n  Gastrointestinal Nausea?: Yes Vomiting?: No Indigestion/heartburn?: Yes Diarrhea?: Yes Constipation?: No  Constitutional Fever: No Night sweats?: No Weight loss?: No Fatigue?: Yes  Skin Skin rash/lesions?: No Itching?: No  Eyes Blurred vision?: No Double vision?: No  Ears/Nose/Throat Sore throat?: No Sinus problems?: No  Hematologic/Lymphatic Swollen glands?: No Easy bruising?: No  Cardiovascular Leg swelling?: No Chest pain?: No  Respiratory Cough?: No Shortness of breath?: No  Endocrine Excessive thirst?: Yes  Musculoskeletal Back pain?: Yes Joint pain?: Yes  Neurological Headaches?: No Dizziness?: No  Psychologic Depression?: Yes Anxiety?: Yes  Physical Exam: BP 138/71   Pulse  66   Resp 16   Ht 5\' 3"  (1.6 m)   SpO2 96%   BMI 36.28 kg/m   Constitutional:  Well nourished. Alert and oriented, No acute distress. HEENT: [place] AT, moist mucus membranes.  Trachea midline, no masses. Cardiovascular: No clubbing, cyanosis, or edema. Respiratory: Normal respiratory effort, no increased work of breathing. GI: Abdomen is soft, non tender, non distended, no abdominal masses. Liver and spleen not palpable.  No hernias appreciated.  Stool sample for occult testing is not indicated.   GU: No CVA tenderness.  No bladder fullness or masses.  Atrophic external genitalia, normal pubic hair distribution, no lesions.  Normal urethral meatus, no lesions, no prolapse, no discharge.   No urethral masses, tenderness and/or tenderness. No bladder fullness, tenderness or masses. Pale vagina mucosa, poor estrogen effect, no discharge, no lesions, good pelvic support, grade II cystocele.  No rectocele noted.  Cervix, uterus and adnexa are surgically absent.   Anus and perineum are without rashes or lesions.    Skin: No rashes, bruises or suspicious lesions. Lymph: No cervical or inguinal adenopathy. Neurologic: Grossly intact, no focal deficits, moving all 4 extremities. Psychiatric: Normal mood and affect.  Laboratory Data: Lab Results  Component Value Date   WBC 10.7 04/01/2018   HGB 11.8 (L) 04/01/2018   HCT 35.1 04/01/2018   MCV 85.7 04/01/2018   PLT 304 04/01/2018    Lab Results  Component Value Date   CREATININE 1.57 (H) 04/01/2018    No results found for: PSA  No results found for: TESTOSTERONE  Lab Results  Component Value Date   HGBA1C 6.6 (H) 03/10/2017    Lab Results  Component Value Date   TSH 7.993 (H) 03/10/2017       Component Value Date/Time   CHOL 186 03/10/2017 0424   HDL 43 03/10/2017 0424   CHOLHDL 4.3 03/10/2017 0424   VLDL 29 03/10/2017 0424   LDLCALC 114 (H) 03/10/2017 0424    Lab Results  Component Value Date   AST 22 04/13/2017   Lab  Results  Component Value Date   ALT 12 (L) 04/13/2017   No  components found for: ALKALINEPHOPHATASE No components found for: BILIRUBINTOTAL  No results found for: ESTRADIOL  Urinalysis CATH UA is negative.  See Epic.    I have reviewed the labs.   Pertinent Imaging: CLINICAL DATA:  Left abdominal pain, nausea  EXAM: CT ABDOMEN AND PELVIS WITH CONTRAST  TECHNIQUE: Multidetector CT imaging of the abdomen and pelvis was performed using the standard protocol following bolus administration of intravenous contrast.  CONTRAST:  140mL ISOVUE-300 IOPAMIDOL (ISOVUE-300) INJECTION 61%  COMPARISON:  09/24/2015  FINDINGS: Lower chest: Heart is top-normal in size. Prosthetic aortic valve. Pacemaker leads, incompletely visualized.  Minimal dependent atelectasis at the lung bases.  Hepatobiliary: Liver is notable for probable focal fat adjacent to the gallbladder fossa (series 2/image 25).  Gallbladder is unremarkable. No intrahepatic or extrahepatic ductal dilatation.  Pancreas: Within normal limits.  Spleen: Within normal limits.  Adrenals/Urinary Tract: Adrenal glands are within normal limits.  1.4 cm hyperdense/hemorrhagic cyst along the right lower kidney (coronal image 79). 2 mm nonobstructing right lower pole renal calculus (coronal image 79).  Left kidney is within normal limits.  No hydronephrosis.  Bladder is within normal limits.  Stomach/Bowel: Stomach is within normal limits.  No evidence of bowel obstruction.  Appendix is not discretely visualized.  Mild to moderate left colonic stool burden.  Vascular/Lymphatic: No evidence of abdominal aortic aneurysm.  Atherosclerotic calcifications of the abdominal aorta and branch vessels.  No suspicious abdominopelvic lymphadenopathy.  Reproductive: Status post hysterectomy.  Right ovaries within normal limits.  No left adnexal mass.  Other: No abdominopelvic ascites.  Surgical  clips in the right inguinal region (series 2/ image 55).  Musculoskeletal: Degenerative changes of the visualized thoracolumbar spine.  IMPRESSION: No evidence of bowel obstruction. Appendix is not discretely visualized.  Mild to moderate left colonic stool burden, suggesting constipation.  2 mm nonobstructing right lower pole renal calculus. No hydronephrosis.  14 mm hemorrhagic right lower pole renal cyst, benign (Bosniak II).   Electronically Signed   By: Julian Hy M.D.   On: 04/13/2017 18:33  I have independently reviewed the films.    Assessment & Plan:    1. Frequency Resolved   2. Dribbling Improved with Myrbetriq 50 mg daily but they are afraid of what the cost may be I will give #28 samples and a script so that they can check with their insurance RTC in three months for OAB questionnaire and PVR   Return in about 3 months (around 09/03/2018) for PVR and OAB questionnaire.  These notes generated with voice recognition software. I apologize for typographical errors.  Zara Council, PA-C  [facility] [address]  [place], [place] [address] [phone]

## 2018-06-03 ENCOUNTER — Ambulatory Visit: Payer: Medicare Other | Admitting: Urology

## 2018-06-03 ENCOUNTER — Encounter: Payer: Self-pay | Admitting: Urology

## 2018-06-03 VITALS — BP 138/71 | HR 66 | Resp 16 | Ht 63.0 in

## 2018-06-03 DIAGNOSIS — N3941 Urge incontinence: Secondary | ICD-10-CM

## 2018-06-03 DIAGNOSIS — R350 Frequency of micturition: Secondary | ICD-10-CM

## 2018-06-03 LAB — URINALYSIS, COMPLETE
Bilirubin, UA: NEGATIVE
GLUCOSE, UA: NEGATIVE
Ketones, UA: NEGATIVE
Leukocytes, UA: NEGATIVE
NITRITE UA: NEGATIVE
PH UA: 5 (ref 5.0–7.5)
Protein, UA: NEGATIVE
RBC, UA: NEGATIVE
Specific Gravity, UA: 1.02 (ref 1.005–1.030)
UUROB: 0.2 mg/dL (ref 0.2–1.0)

## 2018-06-03 LAB — MICROSCOPIC EXAMINATION
EPITHELIAL CELLS (NON RENAL): NONE SEEN /HPF (ref 0–10)
RBC MICROSCOPIC, UA: NONE SEEN /HPF (ref 0–2)
WBC UA: NONE SEEN /HPF (ref 0–5)

## 2018-06-03 MED ORDER — MIRABEGRON ER 50 MG PO TB24: 50.0000 mg | ORAL_TABLET | Freq: Every day | ORAL | 3 refills | Status: DC

## 2018-06-03 NOTE — Addendum Note (Signed)
Addended by: Garnette Gunner on: 06/03/2018 04:22 PM   Modules accepted: Orders

## 2018-06-06 ENCOUNTER — Other Ambulatory Visit (HOSPITAL_COMMUNITY): Payer: Self-pay | Admitting: Psychiatry

## 2018-06-06 DIAGNOSIS — F331 Major depressive disorder, recurrent, moderate: Secondary | ICD-10-CM

## 2018-06-06 LAB — CULTURE, URINE COMPREHENSIVE

## 2018-06-13 ENCOUNTER — Other Ambulatory Visit (HOSPITAL_COMMUNITY): Payer: Self-pay | Admitting: Psychiatry

## 2018-06-13 DIAGNOSIS — F331 Major depressive disorder, recurrent, moderate: Secondary | ICD-10-CM

## 2018-06-15 ENCOUNTER — Other Ambulatory Visit: Payer: Self-pay

## 2018-06-15 ENCOUNTER — Emergency Department
Admission: EM | Admit: 2018-06-15 | Discharge: 2018-06-16 | Disposition: A | Discharge status: Home / Self Care | Payer: Medicare Other | Attending: Emergency Medicine | Admitting: Emergency Medicine

## 2018-06-15 ENCOUNTER — Encounter: Payer: Self-pay | Admitting: Emergency Medicine

## 2018-06-15 ENCOUNTER — Emergency Department: Payer: Medicare Other

## 2018-06-15 DIAGNOSIS — N390 Urinary tract infection, site not specified: Secondary | ICD-10-CM | POA: Insufficient documentation

## 2018-06-15 DIAGNOSIS — E119 Type 2 diabetes mellitus without complications: Secondary | ICD-10-CM | POA: Insufficient documentation

## 2018-06-15 DIAGNOSIS — Z7984 Long term (current) use of oral hypoglycemic drugs: Secondary | ICD-10-CM | POA: Insufficient documentation

## 2018-06-15 DIAGNOSIS — I110 Hypertensive heart disease with heart failure: Secondary | ICD-10-CM | POA: Insufficient documentation

## 2018-06-15 DIAGNOSIS — Z79899 Other long term (current) drug therapy: Secondary | ICD-10-CM | POA: Insufficient documentation

## 2018-06-15 DIAGNOSIS — I5032 Chronic diastolic (congestive) heart failure: Secondary | ICD-10-CM | POA: Insufficient documentation

## 2018-06-15 DIAGNOSIS — I2510 Atherosclerotic heart disease of native coronary artery without angina pectoris: Secondary | ICD-10-CM | POA: Diagnosis not present

## 2018-06-15 DIAGNOSIS — Z7982 Long term (current) use of aspirin: Secondary | ICD-10-CM | POA: Insufficient documentation

## 2018-06-15 DIAGNOSIS — R300 Dysuria: Secondary | ICD-10-CM | POA: Diagnosis present

## 2018-06-15 DIAGNOSIS — E039 Hypothyroidism, unspecified: Secondary | ICD-10-CM | POA: Insufficient documentation

## 2018-06-15 DIAGNOSIS — N3010 Interstitial cystitis (chronic) without hematuria: Secondary | ICD-10-CM | POA: Diagnosis not present

## 2018-06-15 LAB — URINALYSIS, COMPLETE (UACMP) WITH MICROSCOPIC
BILIRUBIN URINE: NEGATIVE
Glucose, UA: NEGATIVE mg/dL
Ketones, ur: NEGATIVE mg/dL
Nitrite: POSITIVE — AB
PH: 5 (ref 5.0–8.0)
Protein, ur: NEGATIVE mg/dL
SPECIFIC GRAVITY, URINE: 1.014 (ref 1.005–1.030)

## 2018-06-15 LAB — BASIC METABOLIC PANEL
Anion gap: 10 (ref 5–15)
BUN: 29 mg/dL — ABNORMAL HIGH (ref 8–23)
CHLORIDE: 99 mmol/L (ref 98–111)
CO2: 28 mmol/L (ref 22–32)
Calcium: 9.5 mg/dL (ref 8.9–10.3)
Creatinine, Ser: 1.67 mg/dL — ABNORMAL HIGH (ref 0.44–1.00)
GFR calc non Af Amer: 28 mL/min — ABNORMAL LOW (ref >60)
GFR, EST AFRICAN AMERICAN: 33 mL/min — AB (ref >60)
Glucose, Bld: 124 mg/dL — ABNORMAL HIGH (ref 70–99)
POTASSIUM: 3.9 mmol/L (ref 3.5–5.1)
SODIUM: 137 mmol/L (ref 135–145)

## 2018-06-15 LAB — CBC
HEMATOCRIT: 33.8 % — AB (ref 35.0–47.0)
HEMOGLOBIN: 11.5 g/dL — AB (ref 12.0–16.0)
MCH: 29.5 pg (ref 26.0–34.0)
MCHC: 34.1 g/dL (ref 32.0–36.0)
MCV: 86.6 fL (ref 80.0–100.0)
Platelets: 275 10*3/uL (ref 150–440)
RBC: 3.9 MIL/uL (ref 3.80–5.20)
RDW: 15.3 % — ABNORMAL HIGH (ref 11.5–14.5)
WBC: 10 10*3/uL (ref 3.6–11.0)

## 2018-06-15 MED ORDER — CEPHALEXIN 250 MG PO CAPS: 250.0000 mg | ORAL_CAPSULE | Freq: Two times a day (BID) | ORAL | 0 refills | Status: AC

## 2018-06-15 MED ORDER — ACETAMINOPHEN 500 MG PO TABS
1000.0000 mg | ORAL_TABLET | ORAL | Status: AC
  Administered 2018-06-15: 1000 mg via ORAL
  Filled 2018-06-15: qty 2

## 2018-06-15 MED ORDER — CEPHALEXIN 250 MG PO CAPS
250.0000 mg | ORAL_CAPSULE | Freq: Once | ORAL | Status: AC
  Administered 2018-06-16: 250 mg via ORAL
  Filled 2018-06-15 (×2): qty 1

## 2018-06-15 NOTE — ED Triage Notes (Addendum)
Pt arrives via POV with complaints of "kidneys not working" -- only able to urinate a few drops at a time -- that began this morning. Reports painful, burning urination. Pt also states she has low back pain on left and right side. Denies flank pain. States she has had back surgery and has rheumatoid arthritis. Dx with interstitial cystitis x years ago. Sees Dr. Erlene Quan for that. Had appointment last week.   Pt also states she has pain in her lower left abdomen that began yesterday. States she has hx of "bad bowels." Reports pain feels like the same pain.

## 2018-06-15 NOTE — ED Provider Notes (Signed)
[facility] Emergency Department Provider Note   ____________________________________________   First MD Initiated Contact with Patient 06/15/18 2102     (approximate)  I have reviewed the triage vital signs and the nursing notes.   HISTORY  Chief Complaint Dysuria    HPI Catherine [name] is a 77 y.o. adult reports she has had a long history of problems with urinary tract infections and interstitial cystitis  About the last day and a half she is noticed that she feels like she has to urinate frequently, is urinating often but only small amounts sometimes only a few drops.  She reports the pain and burning with urination started last evening, but have worsened throughout the day.  No fevers or chills.  Some slight discomfort in the left lower pelvis also.  No pain in her sides.  She follows with Dr. Erlene Quan reports she is had urinary infections and interstitial cystitis for almost 30 years now and feels she is having a flareup.  She is still eating and drinking normally, but she and her daughter report that they know that her kidney function has been decreasing recently and is following with Dr. Erlene Quan   Past Medical History:  Diagnosis Date  . Acute colitis   . Anemia   . CAD (coronary artery disease)    a. heavy calcification of the entire LAD & mod eccentric mid LAD stenosis, estimated @ 70%, mild nonobs stenosis of RCA and LCx, severely calcified Ao valve with restricted valve mobility and 2+ AI    . Cancer [facility]) 1981   breast  . CHB (complete heart block) ([place]) 08/31/2015   [place] DR model [id number] (serial number [id number] )   . Chronic diastolic congestive heart failure ([place])    a. echo 08/31/2015: EF 65-70%, nl WM, GR1DD, Ao valve stent bioprosthesis was present and functioning nl, no regurg, LA mildly dilated, PASP 46 mm Hg  . Collagen vascular disease ([place])   . Colon polyps   . Depression   . Diabetes mellitus type II   .  Fibromyalgia   . HTN (hypertension)   . Hypercholesteremia   . Hypothyroidism   . IBS (irritable bowel syndrome)   . Rectal bleeding   . Rheumatoid arthritis(714.0)   . S/P TAVR (transcatheter aortic valve replacement) 08/30/2015   26 mm Edwards Sapien 3 transcatheter heart valve placed via open right transfemoral approach  . Shortness of breath dyspnea   . Stenosis of aortic valve   . Thyroid disease     Patient Active Problem List   Diagnosis Date Noted  . Thyroid disease   . Stenosis of aortic valve   . Rectal bleeding   . Hypercholesteremia   . HTN (hypertension)   . Depression   . Colon polyps   . Collagen vascular disease ([place])   . CAD (coronary artery disease)   . Anemia   . Acute colitis   . Abdominal pain   . BPPV (benign paroxysmal positional vertigo), bilateral   . CVA (cerebral vascular accident) ([place])   . TIA (transient ischemic attack) 03/09/2017  . Dizziness   . Prolonged Q-T interval on ECG 03/13/2016  . Suicidal ideation 03/06/2016  . Severe recurrent major depression without psychotic features ([place])   . Sleep disturbance 02/28/2016  . Chest pain 09/24/2015  . Hypokalemia 09/07/2015  . IBS (irritable bowel syndrome)   . Cancer ([place])   . Pneumothorax 09/05/2015  . Complete heart block ([place])   .  CHB (complete heart block) ([place]) 08/31/2015  . S/P TAVR (transcatheter aortic valve replacement) 08/30/2015  . Type II diabetes mellitus ([place])   . Essential hypertension   . Chronic diastolic congestive heart failure ([place])   . Rheumatoid arthritis ([place])   . Hypothyroidism   . Fibromyalgia   . Aortic valve stenosis, critical 08/22/2015  . MDD (major depressive disorder) ([place]) 05/26/2012    Past Surgical History:  Procedure Laterality Date  . ABDOMINAL SURGERY     Intestinal surgery  . CARDIAC SURGERY    . CESAREAN SECTION     x 2  . EP IMPLANTABLE DEVICE N/A 08/31/2015   Procedure: Pacemaker Implant;  Surgeon: Will Meredith Leeds, MD; [place] (serial number [id number] ); Laterality: Right  . LEFT OOPHORECTOMY    . MASTECTOMY Left   . polyp     . self inflicted chest wound    . [place]   lower back  . TEE WITHOUT CARDIOVERSION N/A 08/30/2015   Procedure: TRANSESOPHAGEAL ECHOCARDIOGRAM (TEE);  Surgeon: Sherren Mocha, MD;  Location: [place];  Service: Open Heart Surgery;  Laterality: N/A;  . TOTAL ABDOMINAL HYSTERECTOMY    . TRANSCATHETER AORTIC VALVE REPLACEMENT, TRANSFEMORAL N/A 08/30/2015   Procedure: TRANSCATHETER AORTIC VALVE REPLACEMENT, TRANSFEMORAL;  Surgeon: Sherren Mocha, MD;  Location: [place];  Service: Open Heart Surgery;  Laterality: N/A;    Prior to Admission medications   Medication Sig Start Date End Date Taking? Authorizing Provider  aspirin EC 81 MG tablet Take 81 mg by mouth daily.    [provider]  atorvastatin (LIPITOR) 40 MG tablet Take 1 tablet by mouth daily. 01/01/18   [provider]  buPROPion (WELLBUTRIN XL) 300 MG 24 hr tablet Take 1 tablet (300 mg total) by mouth daily. 04/01/18   Arfeen, Arlyce Harman, MD  cephALEXin (KEFLEX) 250 MG capsule Take 1 capsule (250 mg total) by mouth 2 (two) times daily for 7 days. 06/15/18 06/22/18  Delman Kitten, MD  clonazePAM (KLONOPIN) 1 MG tablet Take 1 tablet (1 mg total) by mouth at bedtime. 04/01/18   Arfeen, Arlyce Harman, MD  docusate sodium (COLACE) 100 MG capsule Take 100 mg by mouth 2 (two) times daily.    [provider]  KLOR-CON M10 10 MEQ tablet TAKE 1 TABLET BY MOUTH EVERY DAY 10/01/16   Bhagat, Bhavinkumar, PA  levothyroxine (SYNTHROID, LEVOTHROID) 100 MCG tablet Take 100 mcg by mouth daily before breakfast.     [provider]  Linagliptin-Metformin HCl (JENTADUETO) 2.5-500 MG TABS Take 0.5 tablets by mouth daily with breakfast.     [provider]  lisinopril (PRINIVIL,ZESTRIL) 10 MG tablet TAKE 1 TABLET (10 MG TOTAL) BY MOUTH DAILY. 12/05/16   Sherren Mocha, MD  metoprolol tartrate  (LOPRESSOR) 25 MG tablet Take 25 mg by mouth daily.     [provider]  mirabegron ER (MYRBETRIQ) 50 MG TB24 tablet Take 1 tablet (50 mg total) by mouth daily. 06/03/18   Zara Council A, PA-C  Multiple Vitamin (MULTIVITAMIN WITH MINERALS) TABS tablet Take 1 tablet by mouth daily at 12 noon.     [provider]  ondansetron (ZOFRAN) 4 MG tablet Take 4 mg by mouth as needed for nausea or vomiting.    [provider]  pantoprazole (PROTONIX) 40 MG tablet Take 1 tablet (40 mg total) by mouth daily. 03/31/18   Belva Crome, MD  PARoxetine (PAXIL) 30 MG tablet Take 1 tablet (30 mg  total) by mouth daily. 04/01/18   Arfeen, Arlyce Harman, MD  polyethylene glycol [facility]) packet Take 17 g by mouth daily. 04/13/17   Gregor Hams, MD  tamsulosin (FLOMAX) 0.4 MG CAPS capsule Take 0.4 mg by mouth daily after breakfast.    [provider]  traZODone (DESYREL) 50 MG tablet TAKE 1/2 TABLET AT BED TIME. 04/01/18   Arfeen, Arlyce Harman, MD    Allergies Sulfa antibiotics and Latex  Family History  Problem Relation Age of Onset  . Depression Sister   . Depression Sister   . Depression Sister   . Hypertension Mother   . Lung cancer Sister   . Stroke Sister   . Hypertension Sister   . Heart attack Neg Hx     Social History Social History   Tobacco Use  . Smoking status: Never Smoker  . Smokeless tobacco: Never Used  Substance Use Topics  . Alcohol use: No    Alcohol/week: 0.0 oz  . Drug use: No    Review of Systems Constitutional: No fever/chills Eyes: No visual changes. ENT: No sore throat. Cardiovascular: Denies chest pain. Respiratory: Denies shortness of breath. Gastrointestinal: No abdominal pain except over her "bladder".  No nausea, no vomiting.  No diarrhea.  No constipation. Genitourinary: Some dysuria, increased urgency, and often small amounts of urination.  Does not feel as though her bladder is blocked though. Musculoskeletal: Negative for back  pain. Skin: Negative for rash. Neurological: Negative for headaches or weakness    ____________________________________________   PHYSICAL EXAM:  VITAL SIGNS: ED Triage Vitals  Enc Vitals Group     BP 06/15/18 1703 129/80     Pulse Rate 06/15/18 2248 81     Resp 06/15/18 1703 18     Temp 06/15/18 1703 98.2 F (36.8 C)     Temp Source 06/15/18 1703 Oral     SpO2 06/15/18 1703 98 %     Weight 06/15/18 1704 194 lb (88 kg)     Height 06/15/18 1704 5\' 3"  (1.6 m)     Head Circumference --      Peak Flow --      Pain Score 06/15/18 1714 8     Pain Loc --      Pain Edu? --      Excl. in [place]? --     Constitutional: Alert and oriented. Well appearing and in no acute distress.  She is very pleasant, her husband and daughter at bedside. Eyes: Conjunctivae are normal. Head: Atraumatic. Nose: No congestion/rhinnorhea. Mouth/Throat: Mucous membranes are moist. Neck: No stridor.   Cardiovascular: Normal rate, regular rhythm. Grossly normal heart sounds.  Good peripheral circulation. Respiratory: Normal respiratory effort.  No retractions. Lungs CTAB. Gastrointestinal: Soft and nontender except for some discomfort suprapubically. No distention.  No rebound or guarding.  No CVA tenderness. Musculoskeletal: No lower extremity tenderness nor edema. Neurologic:  Normal speech and language. No gross focal neurologic deficits are appreciated.  Skin:  Skin is warm, dry and intact. No rash noted. Psychiatric: Mood and affect are normal. Speech and behavior are normal.  ____________________________________________   LABS (all labs ordered are listed, but only abnormal results are displayed)  Labs Reviewed  URINALYSIS, COMPLETE (UACMP) WITH MICROSCOPIC - Abnormal; Notable for the following components:      Result Value   Color, Urine AMBER (*)    APPearance HAZY (*)    Hgb urine dipstick SMALL (*)    Nitrite POSITIVE (*)    Leukocytes, UA MODERATE (*)  Bacteria, UA MANY (*)    All  other components within normal limits  CBC - Abnormal; Notable for the following components:   Hemoglobin 11.5 (*)    HCT 33.8 (*)    RDW 15.3 (*)    All other components within normal limits  BASIC METABOLIC PANEL - Abnormal; Notable for the following components:   Glucose, Bld 124 (*)    BUN 29 (*)    Creatinine, Ser 1.67 (*)    GFR calc non Af Amer 28 (*)    GFR calc Af Amer 33 (*)    All other components within normal limits  URINE CULTURE   ____________________________________________  EKG   ____________________________________________  RADIOLOGY  US Renal  Result Date: 06/15/2018 CLINICAL DATA:  Urinary hesitancy and suprapubic discomfort. EXAM: RENAL / URINARY TRACT ULTRASOUND COMPLETE COMPARISON:  04/13/2017 CT abdomen and pelvis FINDINGS: Right Kidney: Length: 11.1 cm. Echogenicity within normal limits. A slightly exophytic 1.4 x 0.7 x 1.1 cm cyst is noted arising from the lower pole of the right kidney, similar in size to prior CT. No solid mass or hydronephrosis visualized. Left Kidney: Length: 10.5 cm. Echogenicity within normal limits. No mass or hydronephrosis visualized. Bladder: Appears normal for degree of bladder distention. No focal mural thickening, mass or calculus is seen. No evidence of distal uropathy. IMPRESSION: Stable cyst arising from the lower pole the right kidney. No nephrolithiasis nor obstructive uropathy. Electronically Signed   By: Ashley Royalty M.D.   On: 06/15/2018 22:56     Renal ultrasound reviewed by me, no acute findings. ____________________________________________   PROCEDURES  Procedure(s) performed: None  Procedures  Critical Care performed: No  ____________________________________________   INITIAL IMPRESSION / ASSESSMENT AND PLAN / ED COURSE  Pertinent labs & imaging results that were available during my care of the patient were reviewed by me and considered in my medical decision making (see chart for details).  Patient  presents for dysuria, urgency, and frequent urination.  History of interstitial cystitis, also urinary tract infections.  Very clinically reassuring exam with normal hemodynamics.  Awake alert very pleasant.  Clinical examination with some suprapubic tenderness and primarily urinary symptoms, denies actual abdominal pain except for which she describes as the bladder she reports being very familiar with this type of discomfort and symptoms in fact reporting almost the same exact symptoms happened to her in April and got better with antibiotics.  Vitals:   06/15/18 2247 06/15/18 2248  BP: 137/70   Pulse:  81  Resp:    Temp:    SpO2:  97%   No cardiac pulmonary neurologic symptoms.  Reassuring hemodynamics.  Lab work reassuring, some slight increase in her baseline creatinine but seems to be up trending over time and is similar with regard to her creatinine GFR from last presentation and checked 2 months ago.  She does not appear clinically dehydrated, appears euvolemic.     Return precautions and treatment recommendations and follow-up discussed with the patient who is agreeable with the plan.  Will treat for urinary tract infection given bacteria are also noted in her urine, sent for culture and follow-up with Dr. Erlene Quan. ____________________________________________   FINAL CLINICAL IMPRESSION(S) / ED DIAGNOSES  Final diagnoses:  Lower urinary tract infectious disease  Interstitial cystitis      NEW MEDICATIONS STARTED DURING THIS VISIT:  New Prescriptions   CEPHALEXIN (KEFLEX) 250 MG CAPSULE    Take 1 capsule (250 mg total) by mouth 2 (two) times daily for 7 days.  Note:  This document was prepared using Dragon voice recognition software and may include unintentional dictation errors.     Delman Kitten, MD 06/15/18 2348

## 2018-06-15 NOTE — Discharge Instructions (Signed)
You have been seen in the Emergency Department (ED) today for pain when urinating.  Your workup today suggests that you have a urinary tract infection (UTI). ° ° °Call your regular doctor to schedule the next available appointment to follow up on today’s ED visit, or return immediately to the ED if your pain worsens, you have decreased urine production, develop fever, persistent vomiting, or other symptoms that concern you. ° °

## 2018-06-18 LAB — URINE CULTURE: Special Requests: NORMAL

## 2018-06-20 ENCOUNTER — Other Ambulatory Visit (HOSPITAL_COMMUNITY): Payer: Self-pay | Admitting: Psychiatry

## 2018-06-20 DIAGNOSIS — F331 Major depressive disorder, recurrent, moderate: Secondary | ICD-10-CM

## 2018-06-23 ENCOUNTER — Ambulatory Visit: Payer: Self-pay | Admitting: Urology

## 2018-06-24 ENCOUNTER — Other Ambulatory Visit (HOSPITAL_COMMUNITY): Payer: Self-pay

## 2018-06-24 DIAGNOSIS — F331 Major depressive disorder, recurrent, moderate: Secondary | ICD-10-CM

## 2018-06-24 MED ORDER — CLONAZEPAM 1 MG PO TABS: 1.0000 mg | ORAL_TABLET | Freq: Every day | ORAL | 0 refills | Status: DC

## 2018-06-24 MED ORDER — TRAZODONE HCL 50 MG PO TABS: 25.0000 mg | ORAL_TABLET | Freq: Every day | ORAL | 0 refills | Status: DC

## 2018-06-24 MED ORDER — TRAZODONE HCL 50 MG PO TABS: ORAL_TABLET | ORAL | 2 refills | Status: DC

## 2018-06-24 MED ORDER — PAROXETINE HCL 30 MG PO TABS: 30.0000 mg | ORAL_TABLET | Freq: Every day | ORAL | 0 refills | Status: DC

## 2018-06-24 MED ORDER — BUPROPION HCL ER (XL) 300 MG PO TB24: 300.0000 mg | ORAL_TABLET | Freq: Every day | ORAL | 0 refills | Status: DC

## 2018-07-01 ENCOUNTER — Ambulatory Visit (HOSPITAL_COMMUNITY): Payer: Self-pay | Admitting: Psychiatry

## 2018-07-04 NOTE — Progress Notes (Signed)
07/07/2018 10:45 PM   [name] [birth year] [id number]  Referring provider: Jilda Panda, MD [id number], [place] [address]  Chief Complaint  Patient presents with  . Hospitalization Follow-up    pt questions if she needs to be on myerbetriq    HPI: Patient is 77 year old Caucasian female with a history of frequency and urinary dribbling who was seen in the emergency room over the weekend and diagnosed with an E. coli urinary tract infection who presents today for follow-up.  Background history Patient is a 77 year old Caucasian female who was seen recently at [facility] ED for frequency and urinary dribbling with her daughter, [name].  She presented to the emergency room on April 01, 2018 with a complaint of urinary frequency and dribbling.  Her PVR at that time was 30 mL.  Her UA was positive for 0-5 RBCs and 0-5 WBCs.  Urine culture was negative.  She was given Keflex and treated for presumptive UTI.  Contrast CT in 04/2017 noted no evidence of bowel obstruction. Appendix is not discretely visualized.  Mild to moderate left colonic stool burden, suggesting constipation.  2 mm nonobstructing right lower pole renal calculus. No hydronephrosis.  14 mm hemorrhagic right lower pole renal cyst, benign (Bosniak II).  Reviewing her records,  she has had no documented positive urine cultures.  She is having strong urgency, very rare dysuria, nocturia x 1, incontinence when she is on her feet a lot and hesitancy.  Patient denies any gross hematuria, dysuria or suprapubic/flank pain.  Patient denies any fevers, chills, nausea or vomiting.   She does have a history of nephrolithiasis.  She has not had GU surgery or GU trauma.  She is postmenopausal.  She has a history of breast cancer.  She admits to constipation and/or diarrhea.   She is drinking 3 cups of water daily.  She is drinking orange/pineapple soda.  She drinks pineapple and grape juice.  No coffee.  No alcohol.  No tea.  She was  started on Myrbetriq 50 mg daily and states she has seen good improvement with her urinary symptoms.  She states she can go better.    On June 15, 2018, she sought treatment in the emergency room after having a day and a half of urinary frequency, voiding very little amounts and dysuria.  RUS on 06/15/2018 noted stable cyst arising from the lower pole the right kidney. No nephrolithiasis nor obstructive uropathy.  Urine culture at that time was positive for pansensitive E. coli.  She was placed on Keflex and she follows up today.  She states that after her pelvic exam with me on 06/03/2018 she noted blood in her urine for three days.  She had pain in her vaginal area, but she did not have bloody discharge.  Patient denies any suprapubic/flank pain.  Patient denies any fevers, chills, nausea or vomiting.   Today, patient has been experiencing urgency x 4-7, frequency x 4-7, not restricting fluids to avoid visits to the restroom, is engaging in toilet mapping, incontinence x 4-7 and nocturia x 0-3.  Patient denies any gross hematuria, dysuria or suprapubic/flank pain.  Patient denies any fevers, chills, nausea or vomiting.    Her UA was negative.  Her PVR is 24 mL.    PMH: Past Medical History:  Diagnosis Date  . Acute colitis   . Anemia   . CAD (coronary artery disease)    a. heavy calcification of the entire LAD & mod eccentric mid LAD stenosis,  estimated @ 70%, mild nonobs stenosis of RCA and LCx, severely calcified Ao valve with restricted valve mobility and 2+ AI    . Cancer [facility]) 1981   breast  . CHB (complete heart block) ([place]) 08/31/2015   [place] DR model DJ2426 (serial number [id number] )   . Chronic diastolic congestive heart failure ([place])    a. echo 08/31/2015: EF 65-70%, nl WM, GR1DD, Ao valve stent bioprosthesis was present and functioning nl, no regurg, LA mildly dilated, PASP 46 mm Hg  . Collagen vascular disease ([place])   . Colon polyps   . Depression   .  Diabetes mellitus type II   . Fibromyalgia   . HTN (hypertension)   . Hypercholesteremia   . Hypothyroidism   . IBS (irritable bowel syndrome)   . Rectal bleeding   . Rheumatoid arthritis(714.0)   . S/P TAVR (transcatheter aortic valve replacement) 08/30/2015   26 mm Edwards Sapien 3 transcatheter heart valve placed via open right transfemoral approach  . Shortness of breath dyspnea   . Stenosis of aortic valve   . Thyroid disease     Surgical History: Past Surgical History:  Procedure Laterality Date  . ABDOMINAL SURGERY     Intestinal surgery  . CARDIAC SURGERY    . CESAREAN SECTION     x 2  . EP IMPLANTABLE DEVICE N/A 08/31/2015   Procedure: Pacemaker Implant;  Surgeon: Will Meredith Leeds, MD; [place] (serial number [id number] ); Laterality: Right  . LEFT OOPHORECTOMY    . MASTECTOMY Left   . polyp     . self inflicted chest wound    . [place]   lower back  . TEE WITHOUT CARDIOVERSION N/A 08/30/2015   Procedure: TRANSESOPHAGEAL ECHOCARDIOGRAM (TEE);  Surgeon: Sherren Mocha, MD;  Location: [place];  Service: Open Heart Surgery;  Laterality: N/A;  . TOTAL ABDOMINAL HYSTERECTOMY    . TRANSCATHETER AORTIC VALVE REPLACEMENT, TRANSFEMORAL N/A 08/30/2015   Procedure: TRANSCATHETER AORTIC VALVE REPLACEMENT, TRANSFEMORAL;  Surgeon: Sherren Mocha, MD;  Location: [facility];  Service: Open Heart Surgery;  Laterality: N/A;    Home Medications:  Allergies as of 07/07/2018      Reactions   Sulfa Antibiotics Hives   Latex Rash      Medication List        Accurate as of 07/07/18 11:59 PM. Always use your most recent med list.          aspirin EC 81 MG tablet Take 81 mg by mouth daily.   atorvastatin 40 MG tablet Commonly known as:  LIPITOR Take 1 tablet by mouth daily.   buPROPion 300 MG 24 hr tablet Commonly known as:  WELLBUTRIN XL Take 1 tablet (300 mg total) by mouth daily.   clonazePAM 1 MG tablet Commonly known as:   KLONOPIN Take 1 tablet (1 mg total) by mouth at bedtime.   docusate sodium 100 MG capsule Commonly known as:  COLACE Take 100 mg by mouth 2 (two) times daily.   JENTADUETO 2.5-500 MG Tabs Generic drug:  linaGLIPtin-metFORMIN HCl Take 0.5 tablets by mouth daily with breakfast.   KLOR-CON M10 10 MEQ tablet Generic drug:  potassium chloride TAKE 1 TABLET BY MOUTH EVERY DAY   levothyroxine 100 MCG tablet Commonly known as:  SYNTHROID, LEVOTHROID Take 100 mcg by mouth daily before breakfast.   lisinopril 10 MG tablet Commonly known as:  PRINIVIL,ZESTRIL TAKE 1 TABLET (10 MG TOTAL) BY MOUTH DAILY.  metoprolol tartrate 25 MG tablet Commonly known as:  LOPRESSOR Take 25 mg by mouth daily.   mirabegron ER 50 MG Tb24 tablet Commonly known as:  MYRBETRIQ Take 1 tablet (50 mg total) by mouth daily.   multivitamin with minerals Tabs tablet Take 1 tablet by mouth daily at 12 noon.   ondansetron 4 MG tablet Commonly known as:  ZOFRAN Take 4 mg by mouth as needed for nausea or vomiting.   pantoprazole 40 MG tablet Commonly known as:  PROTONIX Take 1 tablet (40 mg total) by mouth daily.   PARoxetine 30 MG tablet Commonly known as:  PAXIL Take 1 tablet (30 mg total) by mouth daily.   polyethylene glycol packet Commonly known as:  MIRALAX / GLYCOLAX Take 17 g by mouth daily.   tamsulosin 0.4 MG Caps capsule Commonly known as:  FLOMAX Take 0.4 mg by mouth daily after breakfast.   traZODone 50 MG tablet Commonly known as:  DESYREL Take 0.5 tablets (25 mg total) by mouth at bedtime. TAKE 1/2 TABLET AT BED TIME.       Allergies:  Allergies  Allergen Reactions  . Sulfa Antibiotics Hives  . Latex Rash    Family History: Family History  Problem Relation Age of Onset  . Depression Sister   . Depression Sister   . Depression Sister   . Hypertension Mother   . Lung cancer Sister   . Stroke Sister   . Hypertension Sister   . Heart attack Neg Hx     Social History:   reports that she has never smoked. She has never used smokeless tobacco. She reports that she does not drink alcohol or use drugs.  ROS: UROLOGY Frequent Urination?: No Hard to postpone urination?: No Burning/pain with urination?: No Get up at night to urinate?: Yes Leakage of urine?: No Urine stream starts and stops?: No Trouble starting stream?: No Do you have to strain to urinate?: No Blood in urine?: Yes Urinary tract infection?: Yes Sexually transmitted disease?: No Injury to kidneys or bladder?: No Painful intercourse?: No Weak stream?: No Erection problems?: No Penile pain?: No Currently pregnant?: No Vaginal bleeding?: No Last menstrual period?: Hysterectomy  Gastrointestinal Nausea?: No Vomiting?: No Indigestion/heartburn?: No Diarrhea?: No Constipation?: No  Constitutional Fever: No Night sweats?: No Weight loss?: No Fatigue?: No  Skin Skin rash/lesions?: No Itching?: No  Eyes Blurred vision?: No Double vision?: No  Ears/Nose/Throat Sore throat?: No Sinus problems?: No  Hematologic/Lymphatic Swollen glands?: No Easy bruising?: No  Cardiovascular Leg swelling?: No Chest pain?: No  Respiratory Cough?: No Shortness of breath?: No  Endocrine Excessive thirst?: No  Musculoskeletal Back pain?: No Joint pain?: No  Neurological Headaches?: No Dizziness?: No  Psychologic Depression?: No Anxiety?: No  Physical Exam: BP 127/63 (BP Location: Right Arm, Patient Position: Sitting, Cuff Size: Large)   Pulse 74   Ht 5\' 3"  (1.6 m)   BMI 34.37 kg/m   Constitutional: Well nourished. Alert and oriented, No acute distress. HEENT: [place] AT, moist mucus membranes. Trachea midline, no masses. Cardiovascular: No clubbing, cyanosis, or edema. Respiratory: Normal respiratory effort, no increased work of breathing. Skin: No rashes, bruises or suspicious lesions. Lymph: No cervical or inguinal adenopathy. Neurologic: Grossly intact, no focal  deficits, moving all 4 extremities. Psychiatric: Normal mood and affect.   Laboratory Data: Lab Results  Component Value Date   WBC 10.0 06/15/2018   HGB 11.5 (L) 06/15/2018   HCT 33.8 (L) 06/15/2018   MCV 86.6 06/15/2018   PLT 275  06/15/2018    Lab Results  Component Value Date   CREATININE 1.16 (H) 07/07/2018    No results found for: PSA  No results found for: TESTOSTERONE  Lab Results  Component Value Date   HGBA1C 6.6 (H) 03/10/2017    Lab Results  Component Value Date   TSH 7.993 (H) 03/10/2017       Component Value Date/Time   CHOL 186 03/10/2017 0424   HDL 43 03/10/2017 0424   CHOLHDL 4.3 03/10/2017 0424   VLDL 29 03/10/2017 0424   LDLCALC 114 (H) 03/10/2017 0424    Lab Results  Component Value Date   AST 22 04/13/2017   Lab Results  Component Value Date   ALT 12 (L) 04/13/2017   No components found for: ALKALINEPHOPHATASE No components found for: BILIRUBINTOTAL  No results found for: ESTRADIOL  Urinalysis See Epic.   I have reviewed the labs.   Pertinent Imaging: CLINICAL DATA:  Urinary hesitancy and suprapubic discomfort.  EXAM: RENAL / URINARY TRACT ULTRASOUND COMPLETE  COMPARISON:  04/13/2017 CT abdomen and pelvis  FINDINGS: Right Kidney:  Length: 11.1 cm. Echogenicity within normal limits. A slightly exophytic 1.4 x 0.7 x 1.1 cm cyst is noted arising from the lower pole of the right kidney, similar in size to prior CT. No solid mass or hydronephrosis visualized.  Left Kidney:  Length: 10.5 cm. Echogenicity within normal limits. No mass or hydronephrosis visualized.  Bladder:  Appears normal for degree of bladder distention. No focal mural thickening, mass or calculus is seen. No evidence of distal uropathy.  IMPRESSION: Stable cyst arising from the lower pole the right kidney. No nephrolithiasis nor obstructive uropathy.   Electronically Signed   By: Ashley Royalty M.D.   On: 06/15/2018 22:56 I have  independently reviewed the films.   Results for ADLAI, NIEBLAS (MRN [id number]) as of 07/13/2018 22:45  Ref. Range 07/13/2018 22:44  Scan Result Unknown 24 mL   Assessment & Plan:    1. UTI Resolved.  2. Gross hematuria Explained to the patient that we need to evaluate for kidney cancer and bladder cancer because of the blood in her urine We will need to schedule a CTU and cystoscopy I explained to the patient that a contrast material will be injected into a vein and that in rare instances, an allergic reaction can result and may even life threatening   The patient denies any allergies to contrast, iodine and/or seafood and is  taking metformin. Following the imaging study,  I've recommended a cystoscopy. I described how this is performed, typically in an office setting with a flexible cystoscope. We described the risks, benefits, and possible side effects, the most common of which is a minor amount of blood in the urine and/or burning which usually resolves in 24 to 48 hours.   The patient had the opportunity to ask questions which were answered. Based upon this discussion, the patient is willing to proceed. Therefore, I've ordered: a CT Urogram and cystoscopy. The patient will return following all of the above for discussion of the results.  UA Urine culture BUN + creatinine    3. Frequency Improved with Myrbetriq 50 mg other day;   RTC in three months for OAB questionnaire and PVR - hematuria work up is negative  Return for CT Urogram report and cystoscopy.  These notes generated with voice recognition software. I apologize for typographical errors.  Zara Council, [place] [address]  [place],  [place] [address] [phone]

## 2018-07-07 ENCOUNTER — Encounter: Payer: Self-pay | Admitting: Urology

## 2018-07-07 ENCOUNTER — Ambulatory Visit: Payer: Medicare Other | Admitting: Urology

## 2018-07-07 VITALS — BP 127/63 | HR 74 | Ht 63.0 in

## 2018-07-07 DIAGNOSIS — N390 Urinary tract infection, site not specified: Secondary | ICD-10-CM

## 2018-07-07 DIAGNOSIS — R310 Gross hematuria: Secondary | ICD-10-CM | POA: Diagnosis not present

## 2018-07-07 DIAGNOSIS — R350 Frequency of micturition: Secondary | ICD-10-CM | POA: Diagnosis not present

## 2018-07-08 LAB — URINALYSIS, COMPLETE
Bilirubin, UA: NEGATIVE
GLUCOSE, UA: NEGATIVE
Ketones, UA: NEGATIVE
Leukocytes, UA: NEGATIVE
Nitrite, UA: NEGATIVE
PH UA: 6.5 (ref 5.0–7.5)
PROTEIN UA: NEGATIVE
RBC, UA: NEGATIVE
Specific Gravity, UA: 1.02 (ref 1.005–1.030)
Urobilinogen, Ur: 0.2 mg/dL (ref 0.2–1.0)

## 2018-07-08 LAB — BUN+CREAT
BUN / CREAT RATIO: 21 (ref 12–28)
BUN: 24 mg/dL (ref 8–27)
CREATININE: 1.16 mg/dL — AB (ref 0.57–1.00)
GFR, EST AFRICAN AMERICAN: 52 mL/min/{1.73_m2} — AB (ref >59)
GFR, EST NON AFRICAN AMERICAN: 45 mL/min/{1.73_m2} — AB (ref >59)

## 2018-07-08 LAB — MICROSCOPIC EXAMINATION

## 2018-07-10 LAB — CULTURE, URINE COMPREHENSIVE

## 2018-07-13 LAB — BLADDER SCAN AMB NON-IMAGING: Scan Result: 24

## 2018-07-17 ENCOUNTER — Ambulatory Visit (INDEPENDENT_AMBULATORY_CARE_PROVIDER_SITE_OTHER): Payer: Medicare Other | Admitting: *Deleted

## 2018-07-17 DIAGNOSIS — I442 Atrioventricular block, complete: Secondary | ICD-10-CM

## 2018-07-17 NOTE — Progress Notes (Signed)
Remote pacemaker transmission.   

## 2018-08-01 ENCOUNTER — Other Ambulatory Visit (HOSPITAL_COMMUNITY): Payer: Self-pay | Admitting: Psychiatry

## 2018-08-01 DIAGNOSIS — F331 Major depressive disorder, recurrent, moderate: Secondary | ICD-10-CM

## 2018-08-12 ENCOUNTER — Ambulatory Visit (HOSPITAL_COMMUNITY): Payer: Self-pay | Admitting: Psychiatry

## 2018-08-14 ENCOUNTER — Other Ambulatory Visit: Payer: Self-pay | Admitting: Urology

## 2018-08-25 LAB — CUP PACEART REMOTE DEVICE CHECK
Battery Remaining Longevity: 126 mo
Battery Remaining Percentage: 95.5 %
Battery Voltage: 2.99 V
Brady Statistic AP VS Percent: 7.1 %
Brady Statistic AS VS Percent: 84 %
Brady Statistic RV Percent Paced: 8.7 %
Date Time Interrogation Session: 20190815073025
Implantable Lead Implant Date: 20160928
Implantable Pulse Generator Implant Date: 20160928
Lead Channel Impedance Value: 390 Ohm
Lead Channel Pacing Threshold Amplitude: 0.75 V
Lead Channel Pacing Threshold Amplitude: 0.75 V
Lead Channel Pacing Threshold Pulse Width: 0.4 ms
Lead Channel Sensing Intrinsic Amplitude: 4.5 mV
Lead Channel Setting Pacing Amplitude: 2.5 V
Lead Channel Setting Pacing Pulse Width: 0.4 ms
Lead Channel Setting Sensing Sensitivity: 2.5 mV
MDC IDC LEAD IMPLANT DT: 20160928
MDC IDC LEAD LOCATION: 753859
MDC IDC LEAD LOCATION: 753860
MDC IDC MSMT LEADCHNL RA PACING THRESHOLD PULSEWIDTH: 0.4 ms
MDC IDC MSMT LEADCHNL RV IMPEDANCE VALUE: 460 Ohm
MDC IDC MSMT LEADCHNL RV SENSING INTR AMPL: 10.4 mV
MDC IDC PG SERIAL: 7779295
MDC IDC SET LEADCHNL RA PACING AMPLITUDE: 2 V
MDC IDC STAT BRADY AP VP PERCENT: 2.5 %
MDC IDC STAT BRADY AS VP PERCENT: 6.2 %
MDC IDC STAT BRADY RA PERCENT PACED: 9.4 %
Pulse Gen Model: 2240

## 2018-08-26 ENCOUNTER — Ambulatory Visit
Admission: RE | Admit: 2018-08-26 | Discharge: 2018-08-26 | Disposition: A | Discharge status: Home / Self Care | Payer: Medicare Other | Source: Ambulatory Visit | Attending: Urology | Admitting: Urology

## 2018-08-26 DIAGNOSIS — I700 Atherosclerosis of aorta: Secondary | ICD-10-CM | POA: Diagnosis not present

## 2018-08-26 DIAGNOSIS — R310 Gross hematuria: Secondary | ICD-10-CM | POA: Diagnosis present

## 2018-08-26 DIAGNOSIS — N200 Calculus of kidney: Secondary | ICD-10-CM | POA: Insufficient documentation

## 2018-08-26 LAB — POCT I-STAT CREATININE: CREATININE: 1.4 mg/dL — AB (ref 0.44–1.00)

## 2018-08-26 MED ORDER — IOPAMIDOL (ISOVUE-300) INJECTION 61%
100.0000 mL | Freq: Once | INTRAVENOUS | Status: AC | PRN
  Administered 2018-08-26: 100 mL via INTRAVENOUS

## 2018-08-27 ENCOUNTER — Ambulatory Visit (INDEPENDENT_AMBULATORY_CARE_PROVIDER_SITE_OTHER): Payer: Medicare Other | Admitting: Urology

## 2018-08-27 VITALS — BP 143/69 | HR 79 | Ht 63.0 in | Wt 206.6 lb

## 2018-08-27 DIAGNOSIS — R310 Gross hematuria: Secondary | ICD-10-CM | POA: Diagnosis not present

## 2018-08-27 DIAGNOSIS — N390 Urinary tract infection, site not specified: Secondary | ICD-10-CM

## 2018-08-27 NOTE — Progress Notes (Signed)
   08/27/18  CC: No chief complaint on file.   HPI: 77 year old female with a history of recurrent UTI, storage related voiding symptoms and an isolated episode of gross hematuria.  CTU shows a 3 mm nonobstructing right renal calculus and a Bosniak 2 renal cyst  Blood pressure (!) 143/69, pulse 79, height 5\' 3"  (1.6 m), weight 206 lb 9.6 oz (93.7 kg). NED. A&Ox3.   No respiratory distress   Abd soft, NT, ND Atrophic external genitalia with patent urethral meatus  Cystoscopy Procedure Note  Patient identification was confirmed, informed consent was obtained, and patient was prepped using Betadine solution.  Lidocaine jelly was administered per urethral meatus.    Preoperative abx where received prior to procedure.    Procedure: - Flexible cystoscope introduced, without any difficulty.   - Thorough search of the bladder revealed:    normal urethral meatus    normal urothelium    no stones    no ulcers     no tumors    no urethral polyps    no trabeculation  - Ureteral orifices were normal in position and appearance.  Post-Procedure: - Patient tolerated the procedure well  Assessment/ Plan: No significant abnormalities identified on cystoscopy.  Her symptoms are improved on Myrbetriq which she will continue.  Would recommend a follow-up KUB in 6 months regarding her renal calculus.  Follow-up with Larene Beach in 3 months for symptom reassessment.   Abbie Sons, MD

## 2018-08-28 LAB — URINALYSIS, COMPLETE
BILIRUBIN UA: NEGATIVE
GLUCOSE, UA: NEGATIVE
KETONES UA: NEGATIVE
LEUKOCYTES UA: NEGATIVE
NITRITE UA: NEGATIVE
Protein, UA: NEGATIVE
SPEC GRAV UA: 1.02 (ref 1.005–1.030)
Urobilinogen, Ur: 0.2 mg/dL (ref 0.2–1.0)
pH, UA: 5 (ref 5.0–7.5)

## 2018-08-28 LAB — MICROSCOPIC EXAMINATION
Epithelial Cells (non renal): >10 /hpf — ABNORMAL HIGH (ref 0–10)
RBC, UA: NONE SEEN /hpf (ref 0–2)
WBC, UA: NONE SEEN /hpf (ref 0–5)

## 2018-09-04 ENCOUNTER — Ambulatory Visit: Payer: Self-pay | Admitting: Urology

## 2018-09-13 ENCOUNTER — Ambulatory Visit (HOSPITAL_COMMUNITY): Payer: Self-pay | Admitting: Psychiatry

## 2018-09-14 ENCOUNTER — Other Ambulatory Visit (HOSPITAL_COMMUNITY): Payer: Self-pay | Admitting: Psychiatry

## 2018-09-14 DIAGNOSIS — F331 Major depressive disorder, recurrent, moderate: Secondary | ICD-10-CM

## 2018-09-15 ENCOUNTER — Other Ambulatory Visit: Payer: Self-pay | Admitting: Internal Medicine

## 2018-09-15 ENCOUNTER — Ambulatory Visit
Admission: RE | Admit: 2018-09-15 | Discharge: 2018-09-15 | Disposition: A | Discharge status: Home / Self Care | Payer: Medicare Other | Source: Ambulatory Visit | Attending: Internal Medicine | Admitting: Internal Medicine

## 2018-09-15 DIAGNOSIS — R0602 Shortness of breath: Secondary | ICD-10-CM

## 2018-09-18 ENCOUNTER — Ambulatory Visit (HOSPITAL_COMMUNITY): Payer: Self-pay | Admitting: Psychiatry

## 2018-09-29 ENCOUNTER — Other Ambulatory Visit (HOSPITAL_COMMUNITY): Payer: Self-pay | Admitting: Psychiatry

## 2018-09-29 DIAGNOSIS — F331 Major depressive disorder, recurrent, moderate: Secondary | ICD-10-CM

## 2018-10-01 ENCOUNTER — Other Ambulatory Visit (HOSPITAL_COMMUNITY): Payer: Self-pay

## 2018-10-01 DIAGNOSIS — F331 Major depressive disorder, recurrent, moderate: Secondary | ICD-10-CM

## 2018-10-01 MED ORDER — BUPROPION HCL ER (XL) 300 MG PO TB24: 300.0000 mg | ORAL_TABLET | Freq: Every day | ORAL | 0 refills | Status: DC

## 2018-10-01 MED ORDER — PAROXETINE HCL 30 MG PO TABS: 30.0000 mg | ORAL_TABLET | Freq: Every day | ORAL | 0 refills | Status: DC

## 2018-10-01 MED ORDER — TRAZODONE HCL 50 MG PO TABS: 25.0000 mg | ORAL_TABLET | Freq: Every day | ORAL | 0 refills | Status: DC

## 2018-10-08 ENCOUNTER — Ambulatory Visit (INDEPENDENT_AMBULATORY_CARE_PROVIDER_SITE_OTHER): Payer: Medicare Other | Admitting: Psychiatry

## 2018-10-08 ENCOUNTER — Encounter (HOSPITAL_COMMUNITY): Payer: Self-pay | Admitting: Psychiatry

## 2018-10-08 VITALS — BP 128/74 | Ht 63.0 in | Wt 190.0 lb

## 2018-10-08 DIAGNOSIS — F411 Generalized anxiety disorder: Secondary | ICD-10-CM | POA: Diagnosis not present

## 2018-10-08 DIAGNOSIS — F331 Major depressive disorder, recurrent, moderate: Secondary | ICD-10-CM

## 2018-10-08 MED ORDER — PAROXETINE HCL 40 MG PO TABS: 40.0000 mg | ORAL_TABLET | Freq: Every day | ORAL | 0 refills | Status: DC
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
## 2018-10-16 ENCOUNTER — Telehealth: Payer: Self-pay | Admitting: Cardiology

## 2018-10-16 ENCOUNTER — Ambulatory Visit (INDEPENDENT_AMBULATORY_CARE_PROVIDER_SITE_OTHER): Payer: Medicare Other | Admitting: *Deleted

## 2018-10-16 DIAGNOSIS — I442 Atrioventricular block, complete: Secondary | ICD-10-CM

## 2018-10-16 NOTE — Progress Notes (Signed)
Remote pacemaker transmission.   

## 2018-10-16 NOTE — Telephone Encounter (Signed)
Spoke with pt and reminded pt of remote transmission that is due today. Pt verbalized understanding.   

## 2018-10-23 ENCOUNTER — Observation Stay
Admission: EM | Admit: 2018-10-23 | Discharge: 2018-10-29 | Disposition: A | Discharge status: Home Health | Payer: Medicare Other | Attending: Internal Medicine | Admitting: Internal Medicine

## 2018-10-23 ENCOUNTER — Encounter: Payer: Self-pay | Admitting: Emergency Medicine

## 2018-10-23 ENCOUNTER — Emergency Department: Payer: Medicare Other

## 2018-10-23 ENCOUNTER — Other Ambulatory Visit: Payer: Self-pay

## 2018-10-23 DIAGNOSIS — R531 Weakness: Secondary | ICD-10-CM | POA: Diagnosis present

## 2018-10-23 DIAGNOSIS — E785 Hyperlipidemia, unspecified: Secondary | ICD-10-CM | POA: Insufficient documentation

## 2018-10-23 DIAGNOSIS — T82111A Breakdown (mechanical) of cardiac pulse generator (battery), initial encounter: Secondary | ICD-10-CM

## 2018-10-23 DIAGNOSIS — E1122 Type 2 diabetes mellitus with diabetic chronic kidney disease: Secondary | ICD-10-CM | POA: Diagnosis not present

## 2018-10-23 DIAGNOSIS — Z950 Presence of cardiac pacemaker: Secondary | ICD-10-CM | POA: Insufficient documentation

## 2018-10-23 DIAGNOSIS — E7800 Pure hypercholesterolemia, unspecified: Secondary | ICD-10-CM | POA: Diagnosis not present

## 2018-10-23 DIAGNOSIS — R32 Unspecified urinary incontinence: Secondary | ICD-10-CM | POA: Insufficient documentation

## 2018-10-23 DIAGNOSIS — M797 Fibromyalgia: Secondary | ICD-10-CM | POA: Diagnosis not present

## 2018-10-23 DIAGNOSIS — N183 Chronic kidney disease, stage 3 (moderate): Secondary | ICD-10-CM | POA: Insufficient documentation

## 2018-10-23 DIAGNOSIS — F329 Major depressive disorder, single episode, unspecified: Secondary | ICD-10-CM | POA: Insufficient documentation

## 2018-10-23 DIAGNOSIS — M069 Rheumatoid arthritis, unspecified: Secondary | ICD-10-CM | POA: Insufficient documentation

## 2018-10-23 DIAGNOSIS — Z952 Presence of prosthetic heart valve: Secondary | ICD-10-CM | POA: Insufficient documentation

## 2018-10-23 DIAGNOSIS — R52 Pain, unspecified: Secondary | ICD-10-CM

## 2018-10-23 DIAGNOSIS — R0602 Shortness of breath: Secondary | ICD-10-CM

## 2018-10-23 DIAGNOSIS — Z87442 Personal history of urinary calculi: Secondary | ICD-10-CM | POA: Diagnosis not present

## 2018-10-23 DIAGNOSIS — Z79899 Other long term (current) drug therapy: Secondary | ICD-10-CM | POA: Insufficient documentation

## 2018-10-23 DIAGNOSIS — R29810 Facial weakness: Secondary | ICD-10-CM | POA: Insufficient documentation

## 2018-10-23 DIAGNOSIS — D509 Iron deficiency anemia, unspecified: Secondary | ICD-10-CM | POA: Diagnosis not present

## 2018-10-23 DIAGNOSIS — K589 Irritable bowel syndrome without diarrhea: Secondary | ICD-10-CM | POA: Diagnosis not present

## 2018-10-23 DIAGNOSIS — E876 Hypokalemia: Secondary | ICD-10-CM | POA: Diagnosis not present

## 2018-10-23 DIAGNOSIS — F419 Anxiety disorder, unspecified: Secondary | ICD-10-CM | POA: Diagnosis not present

## 2018-10-23 DIAGNOSIS — I951 Orthostatic hypotension: Principal | ICD-10-CM | POA: Insufficient documentation

## 2018-10-23 DIAGNOSIS — I350 Nonrheumatic aortic (valve) stenosis: Secondary | ICD-10-CM | POA: Diagnosis not present

## 2018-10-23 DIAGNOSIS — I2510 Atherosclerotic heart disease of native coronary artery without angina pectoris: Secondary | ICD-10-CM | POA: Insufficient documentation

## 2018-10-23 DIAGNOSIS — I130 Hypertensive heart and chronic kidney disease with heart failure and stage 1 through stage 4 chronic kidney disease, or unspecified chronic kidney disease: Secondary | ICD-10-CM | POA: Insufficient documentation

## 2018-10-23 DIAGNOSIS — E039 Hypothyroidism, unspecified: Secondary | ICD-10-CM | POA: Diagnosis not present

## 2018-10-23 DIAGNOSIS — I471 Supraventricular tachycardia: Secondary | ICD-10-CM | POA: Insufficient documentation

## 2018-10-23 DIAGNOSIS — Z882 Allergy status to sulfonamides status: Secondary | ICD-10-CM | POA: Insufficient documentation

## 2018-10-23 DIAGNOSIS — R2681 Unsteadiness on feet: Secondary | ICD-10-CM | POA: Insufficient documentation

## 2018-10-23 DIAGNOSIS — R55 Syncope and collapse: Secondary | ICD-10-CM | POA: Diagnosis present

## 2018-10-23 DIAGNOSIS — Z7982 Long term (current) use of aspirin: Secondary | ICD-10-CM | POA: Insufficient documentation

## 2018-10-23 DIAGNOSIS — I5032 Chronic diastolic (congestive) heart failure: Secondary | ICD-10-CM | POA: Diagnosis not present

## 2018-10-23 DIAGNOSIS — R0600 Dyspnea, unspecified: Secondary | ICD-10-CM

## 2018-10-23 LAB — BASIC METABOLIC PANEL
Anion gap: 6 (ref 5–15)
BUN: 21 mg/dL (ref 8–23)
CALCIUM: 9.4 mg/dL (ref 8.9–10.3)
CO2: 26 mmol/L (ref 22–32)
Chloride: 105 mmol/L (ref 98–111)
Creatinine, Ser: 1.29 mg/dL — ABNORMAL HIGH (ref 0.44–1.00)
GFR calc non Af Amer: 38 mL/min — ABNORMAL LOW (ref >60)
GFR, EST AFRICAN AMERICAN: 44 mL/min — AB (ref >60)
GLUCOSE: 95 mg/dL (ref 70–99)
Potassium: 4.2 mmol/L (ref 3.5–5.1)
Sodium: 137 mmol/L (ref 135–145)

## 2018-10-23 LAB — URINALYSIS, COMPLETE (UACMP) WITH MICROSCOPIC
BILIRUBIN URINE: NEGATIVE
GLUCOSE, UA: NEGATIVE mg/dL
HGB URINE DIPSTICK: NEGATIVE
Ketones, ur: NEGATIVE mg/dL
LEUKOCYTES UA: NEGATIVE
NITRITE: NEGATIVE
PH: 5 (ref 5.0–8.0)
Protein, ur: NEGATIVE mg/dL
SPECIFIC GRAVITY, URINE: 1.016 (ref 1.005–1.030)

## 2018-10-23 LAB — CBC
HCT: 31.4 % — ABNORMAL LOW (ref 36.0–46.0)
Hemoglobin: 9.4 g/dL — ABNORMAL LOW (ref 12.0–15.0)
MCH: 27.4 pg (ref 26.0–34.0)
MCHC: 29.9 g/dL — ABNORMAL LOW (ref 30.0–36.0)
MCV: 91.5 fL (ref 80.0–100.0)
NRBC: 0 % (ref 0.0–0.2)
PLATELETS: 240 10*3/uL (ref 150–400)
RBC: 3.43 MIL/uL — ABNORMAL LOW (ref 3.87–5.11)
RDW: 15.8 % — ABNORMAL HIGH (ref 11.5–15.5)
WBC: 8.5 10*3/uL (ref 4.0–10.5)

## 2018-10-23 LAB — TROPONIN I: Troponin I: <0.03 ng/mL (ref <0.03)

## 2018-10-23 LAB — GLUCOSE, CAPILLARY: GLUCOSE-CAPILLARY: 93 mg/dL (ref 70–99)

## 2018-10-23 MED ORDER — SODIUM CHLORIDE 0.9 % IV BOLUS
500.0000 mL | Freq: Once | INTRAVENOUS | Status: AC
  Administered 2018-10-24: 500 mL via INTRAVENOUS

## 2018-10-23 NOTE — ED Provider Notes (Addendum)
[facility] Emergency Department Provider Note  ____________________________________________  Time seen: Approximately 11:56 PM  I have reviewed the triage vital signs and the nursing notes.   HISTORY  Chief Complaint Weakness    HPI Catherine [name] is a 77 y.o. adult with a history of CAD, complete heart block, fibromyalgia, depression, diabetes who complains of generalized weakness over the past  several weeks.  She reports that after she urinates and tries to get up from the toilet she feels weak.  When she checks her vital signs her heart rate sometimes as low, in the 40s, with a blood pressure of 90/50.  Today she got a reading of 60/30 when she called her doctor they told her to come to the emergency room.  She does take her blood pressure with a automated wrist cuff.  Denies syncope.  Reports eating and drinking normally.  No chest pain or shortness of breath.  Only specific symptom is dysuria.  She has a pacemaker, last remote check was 1 week ago and reported to be fine. Her daughter reports that she had an episode where they thought she might be having some left facial weakness.  The daughter reports that she actually did not see any but the patient was complaining of a headache on that side.  No specific weakness or neurologic symptoms noted during that episode.  Patient reports she has not been eating regularly recently and when she gets dehydrated she feels like this.  Past Medical History:  Diagnosis Date  . Acute colitis   . Anemia   . CAD (coronary artery disease)    a. heavy calcification of the entire LAD & mod eccentric mid LAD stenosis, estimated @ 70%, mild nonobs stenosis of RCA and LCx, severely calcified Ao valve with restricted valve mobility and 2+ AI    . Cancer [facility]) 1981   breast  . CHB (complete heart block) ([place]) 08/31/2015   [place] DR model [id number] (serial number [id number] )   . Chronic diastolic congestive heart  failure ([place])    a. echo 08/31/2015: EF 65-70%, nl WM, GR1DD, Ao valve stent bioprosthesis was present and functioning nl, no regurg, LA mildly dilated, PASP 46 mm Hg  . Collagen vascular disease ([place])   . Colon polyps   . Depression   . Diabetes mellitus type II   . Fibromyalgia   . HTN (hypertension)   . Hypercholesteremia   . Hypothyroidism   . IBS (irritable bowel syndrome)   . Rectal bleeding   . Rheumatoid arthritis(714.0)   . S/P TAVR (transcatheter aortic valve replacement) 08/30/2015   26 mm Edwards Sapien 3 transcatheter heart valve placed via open right transfemoral approach  . Shortness of breath dyspnea   . Stenosis of aortic valve   . Thyroid disease      Patient Active Problem List   Diagnosis Date Noted  . Thyroid disease   . Stenosis of aortic valve   . Rectal bleeding   . Hypercholesteremia   . HTN (hypertension)   . Depression   . Colon polyps   . Collagen vascular disease ([place])   . CAD (coronary artery disease)   . Anemia   . Acute colitis   . Abdominal pain   . BPPV (benign paroxysmal positional vertigo), bilateral   . CVA (cerebral vascular accident) ([place])   . TIA (transient ischemic attack) 03/09/2017  . Dizziness   . Prolonged Q-T interval on ECG 03/13/2016  . Suicidal ideation  03/06/2016  . Severe recurrent major depression without psychotic features ([place])   . Sleep disturbance 02/28/2016  . Chest pain 09/24/2015  . Hypokalemia 09/07/2015  . IBS (irritable bowel syndrome)   . Cancer ([place])   . Pneumothorax 09/05/2015  . Complete heart block ([place])   . CHB (complete heart block) ([place]) 08/31/2015  . S/P TAVR (transcatheter aortic valve replacement) 08/30/2015  . Type II diabetes mellitus ([place])   . Essential hypertension   . Chronic diastolic congestive heart failure ([place])   . Rheumatoid arthritis ([place])   . Hypothyroidism   . Fibromyalgia   . Aortic valve stenosis, critical 08/22/2015  . MDD (major depressive disorder) ([place]) 05/26/2012      Past Surgical History:  Procedure Laterality Date  . ABDOMINAL SURGERY     Intestinal surgery  . CARDIAC SURGERY    . CESAREAN SECTION     x 2  . EP IMPLANTABLE DEVICE N/A 08/31/2015   Procedure: Pacemaker Implant;  Surgeon: Will Meredith Leeds, MD; [place] (serial number [id number] ); Laterality: Right  . LEFT OOPHORECTOMY    . MASTECTOMY Left   . polyp     . self inflicted chest wound    . [place]   lower back  . TEE WITHOUT CARDIOVERSION N/A 08/30/2015   Procedure: TRANSESOPHAGEAL ECHOCARDIOGRAM (TEE);  Surgeon: Sherren Mocha, MD;  Location: [place];  Service: Open Heart Surgery;  Laterality: N/A;  . TOTAL ABDOMINAL HYSTERECTOMY    . TRANSCATHETER AORTIC VALVE REPLACEMENT, TRANSFEMORAL N/A 08/30/2015   Procedure: TRANSCATHETER AORTIC VALVE REPLACEMENT, TRANSFEMORAL;  Surgeon: Sherren Mocha, MD;  Location: [place];  Service: Open Heart Surgery;  Laterality: N/A;     Prior to Admission medications   Medication Sig Start Date End Date Taking? Authorizing Provider  aspirin EC 81 MG tablet Take 81 mg by mouth daily.    [provider]  atorvastatin (LIPITOR) 40 MG tablet Take 1 tablet by mouth daily. 01/01/18   [provider]  buPROPion (WELLBUTRIN XL) 300 MG 24 hr tablet Take 1 tablet (300 mg total) by mouth daily. 10/01/18   Arfeen, Arlyce Harman, MD  clonazePAM (KLONOPIN) 1 MG tablet TAKE 1 TABLET BY MOUTH AT BEDTIME 09/15/18   Arfeen, Arlyce Harman, MD  docusate sodium (COLACE) 100 MG capsule Take 100 mg by mouth 2 (two) times daily.    [provider]  KLOR-CON M10 10 MEQ tablet TAKE 1 TABLET BY MOUTH EVERY DAY 10/01/16   Bhagat, Bhavinkumar, PA  levothyroxine (SYNTHROID, LEVOTHROID) 100 MCG tablet Take 100 mcg by mouth daily before breakfast.     [provider]  Linagliptin-Metformin HCl (JENTADUETO) 2.5-500 MG TABS Take 0.5 tablets by mouth daily with breakfast.     [provider]  lisinopril  (PRINIVIL,ZESTRIL) 10 MG tablet TAKE 1 TABLET (10 MG TOTAL) BY MOUTH DAILY. 12/05/16   Sherren Mocha, MD  metoprolol tartrate (LOPRESSOR) 25 MG tablet Take 25 mg by mouth daily.     [provider]  mirabegron ER (MYRBETRIQ) 50 MG TB24 tablet Take 1 tablet (50 mg total) by mouth daily. 06/03/18   Zara Council A, PA-C  Multiple Vitamin (MULTIVITAMIN WITH MINERALS) TABS tablet Take 1 tablet by mouth daily at 12 noon.     [provider]  ondansetron (ZOFRAN) 4 MG tablet Take 4 mg by mouth as needed for nausea or vomiting.    [provider]  pantoprazole (PROTONIX) 40 MG tablet Take 1 tablet (40  mg total) by mouth daily. 03/31/18   Belva Crome, MD  PARoxetine (PAXIL) 40 MG tablet Take 1 tablet (40 mg total) by mouth daily. 10/08/18   Arfeen, Arlyce Harman, MD  polyethylene glycol [facility]) packet Take 17 g by mouth daily. 04/13/17   Gregor Hams, MD  tamsulosin (FLOMAX) 0.4 MG CAPS capsule Take 0.4 mg by mouth daily after breakfast.    [provider]  traZODone (DESYREL) 50 MG tablet Take 0.5 tablets (25 mg total) by mouth at bedtime. TAKE 1/2 TABLET AT BED TIME. 10/01/18   Kathlee Nations, MD     Allergies Sulfa antibiotics and Latex   Family History  Problem Relation Age of Onset  . Depression Sister   . Depression Sister   . Depression Sister   . Hypertension Mother   . Lung cancer Sister   . Stroke Sister   . Hypertension Sister   . Heart attack Neg Hx     Social History Social History   Tobacco Use  . Smoking status: Never Smoker  . Smokeless tobacco: Never Used  Substance Use Topics  . Alcohol use: No    Alcohol/week: 0.0 standard drinks  . Drug use: No    Review of Systems  Constitutional:   No fever or chills.  ENT:   No sore throat. No rhinorrhea. Cardiovascular:   No chest pain or syncope. Respiratory:   No dyspnea or cough. Gastrointestinal:   Negative for abdominal pain, vomiting and diarrhea.  Musculoskeletal:   Negative  for focal pain or swelling All other systems reviewed and are negative except as documented above in ROS and HPI.  ____________________________________________   PHYSICAL EXAM:  VITAL SIGNS: ED Triage Vitals  Enc Vitals Group     BP 10/23/18 1751 (!) 128/44     Pulse Rate 10/23/18 1751 68     Resp 10/23/18 1751 16     Temp 10/23/18 1751 98.6 F (37 C)     Temp Source 10/23/18 1751 Oral     SpO2 10/23/18 1751 100 %     Weight 10/23/18 1802 200 lb (90.7 kg)     Height 10/23/18 1802 5\' 3"  (1.6 m)     Head Circumference --      Peak Flow --      Pain Score 10/23/18 1801 0     Pain Loc --      Pain Edu? --      Excl. in [place]? --     Vital signs reviewed, nursing assessments reviewed.   Constitutional:   Alert and oriented. Non-toxic appearance. Eyes:   Conjunctivae are normal. EOMI. PERRL. ENT      Head:   Normocephalic and atraumatic.      Nose:   No congestion/rhinnorhea.       Mouth/Throat:   MMM, no pharyngeal erythema. No peritonsillar mass.       Neck:   No meningismus. Full ROM. Hematological/Lymphatic/Immunilogical:   No cervical lymphadenopathy. Cardiovascular:   RRR. Symmetric bilateral radial and DP pulses.  No murmurs. Cap refill less than 2 seconds. Respiratory:   Normal respiratory effort without tachypnea/retractions. Breath sounds are clear and equal bilaterally. No wheezes/rales/rhonchi. Gastrointestinal:   Soft and nontender. Non distended. There is no CVA tenderness.  No rebound, rigidity, or guarding.  Musculoskeletal:   Normal range of motion in all extremities. No joint effusions.  No lower extremity tenderness.  No edema. Neurologic:   Normal speech and language.  Cranial nerves III through XII intact  Motor grossly intact. No acute focal neurologic deficits are appreciated.  Skin:    Skin is warm, dry and intact. No rash noted.  No petechiae, purpura, or bullae.  ____________________________________________    LABS (pertinent  positives/negatives) (all labs ordered are listed, but only abnormal results are displayed) Labs Reviewed  BASIC METABOLIC PANEL - Abnormal; Notable for the following components:      Result Value   Creatinine, Ser 1.29 (*)    GFR calc non Af Amer 38 (*)    GFR calc Af Amer 44 (*)    All other components within normal limits  CBC - Abnormal; Notable for the following components:   RBC 3.43 (*)    Hemoglobin 9.4 (*)    HCT 31.4 (*)    MCHC 29.9 (*)    RDW 15.8 (*)    All other components within normal limits  URINALYSIS, COMPLETE (UACMP) WITH MICROSCOPIC - Abnormal; Notable for the following components:   Color, Urine YELLOW (*)    APPearance HAZY (*)    Bacteria, UA RARE (*)    All other components within normal limits  TROPONIN I  GLUCOSE, CAPILLARY   ____________________________________________   EKG  Interpreted by me Ventricular paced rhythm, rate of 69.  Left axis, left bundle branch block.  No acute ischemic changes.  ____________________________________________    RADIOLOGY  Ct Head Wo Contrast  Result Date: 10/23/2018 CLINICAL DATA:  Daughter st that pt has been confused and was concerned over a low potassium; also reports that they have noticed a left sided facial droop todayNeuro deficit(s), subacute EXAM: CT HEAD WITHOUT CONTRAST TECHNIQUE: Contiguous axial images were obtained from the base of the skull through the vertex without intravenous contrast. COMPARISON:  None. FINDINGS: Brain: No acute intracranial hemorrhage. No focal mass lesion. No CT evidence of acute infarction. No midline shift or mass effect. No hydrocephalus. Basilar cisterns are patent. Mild periventricular white matter hypodensities and mild atrophy. Vascular: No hyperdense vessel or unexpected calcification. Skull: Normal. Negative for fracture or focal lesion. Sinuses/Orbits: Paranasal sinuses and mastoid air cells are clear. Orbits are clear. Other: None. IMPRESSION: No acute intracranial  findings. Electronically Signed   By: Suzy Bouchard M.D.   On: 10/23/2018 21:13    ____________________________________________   PROCEDURES Procedures  ____________________________________________  DIFFERENTIAL DIAGNOSIS   Dehydration, fatigue, pacemaker malfunction.  CLINICAL IMPRESSION / ASSESSMENT AND PLAN / ED COURSE  Pertinent labs & imaging results that were available during my care of the patient were reviewed by me and considered in my medical decision making (see chart for details).    Patient presents with fatigue, reports concerning vital sign measurements at home.  However she is using a automated wrist cuff which may be unreliable.  We will interrogate the pacemaker as the device should be recording these episodes and show signs of malfunction.  Orthostatics are normal.  CT head is negative.  Doubt stroke given her normal neuro exam currently and otherwise atypical presentation.     ____________________________________________   FINAL CLINICAL IMPRESSION(S) / ED DIAGNOSES    Final diagnoses:  Generalized weakness     ED Discharge Orders    None      Portions of this note were generated with dragon dictation software. Dictation errors may occur despite best attempts at proofreading.    Carrie Mew, MD 10/24/18 Dyann Kief    Carrie Mew, MD 10/24/18 [phone]

## 2018-10-23 NOTE — ED Triage Notes (Signed)
Patient's family reports patient having increased lethargy and weakness as well as decreased appetite for the last week. States today at lunch she was having trouble staying awake and was increasingly off balance. History of anemia. Similar episodes in the past when dehydrated and hypokalemic.

## 2018-10-23 NOTE — ED Notes (Signed)
Reviewed pt's chart, c/o and results; triage EDT notified to repeat vs; daughter st that pt has been confused and was concerned over a low potassium; also reports that they have noticed a left sided facial droop today; charge nurse notified and order obtained for head CT

## 2018-10-23 NOTE — ED Notes (Signed)
Family at bedside.  Pt resting comfortably

## 2018-10-23 NOTE — ED Notes (Signed)
Family states pt had L sided facial droop and slurred speech this morning.

## 2018-10-23 NOTE — ED Notes (Signed)
BG 93

## 2018-10-24 ENCOUNTER — Observation Stay (HOSPITAL_BASED_OUTPATIENT_CLINIC_OR_DEPARTMENT_OTHER)
Admit: 2018-10-24 | Discharge: 2018-10-24 | Disposition: A | Discharge status: Home / Self Care | Payer: Medicare Other | Attending: Internal Medicine | Admitting: Internal Medicine

## 2018-10-24 ENCOUNTER — Encounter: Payer: Self-pay | Admitting: Internal Medicine

## 2018-10-24 DIAGNOSIS — R55 Syncope and collapse: Secondary | ICD-10-CM | POA: Diagnosis present

## 2018-10-24 DIAGNOSIS — R531 Weakness: Secondary | ICD-10-CM | POA: Diagnosis not present

## 2018-10-24 DIAGNOSIS — D649 Anemia, unspecified: Secondary | ICD-10-CM | POA: Diagnosis not present

## 2018-10-24 DIAGNOSIS — I361 Nonrheumatic tricuspid (valve) insufficiency: Secondary | ICD-10-CM | POA: Diagnosis not present

## 2018-10-24 LAB — HEPATIC FUNCTION PANEL
ALBUMIN: 3.5 g/dL (ref 3.5–5.0)
ALT: 11 U/L (ref 0–44)
AST: 20 U/L (ref 15–41)
Alkaline Phosphatase: 40 U/L (ref 38–126)
BILIRUBIN DIRECT: 0.1 mg/dL (ref 0.0–0.2)
Indirect Bilirubin: 0.5 mg/dL (ref 0.3–0.9)
Total Bilirubin: 0.6 mg/dL (ref 0.3–1.2)
Total Protein: 6.3 g/dL — ABNORMAL LOW (ref 6.5–8.1)

## 2018-10-24 LAB — FERRITIN: Ferritin: 17 ng/mL (ref 11–307)

## 2018-10-24 LAB — ECHOCARDIOGRAM COMPLETE
Height: 63 in
Weight: 3200 oz

## 2018-10-24 LAB — RETICULOCYTES
IMMATURE RETIC FRACT: 14.1 % (ref 2.3–15.9)
RBC.: 3.16 MIL/uL — AB (ref 3.87–5.11)
RETIC COUNT ABSOLUTE: 87.2 10*3/uL (ref 19.0–186.0)
Retic Ct Pct: 2.8 % (ref 0.4–3.1)

## 2018-10-24 LAB — MAGNESIUM: Magnesium: 1.9 mg/dL (ref 1.7–2.4)

## 2018-10-24 LAB — FOLATE: FOLATE: 40 ng/mL (ref >5.9)

## 2018-10-24 LAB — GLUCOSE, CAPILLARY
GLUCOSE-CAPILLARY: 95 mg/dL (ref 70–99)
GLUCOSE-CAPILLARY: 115 mg/dL — AB (ref 70–99)
Glucose-Capillary: 109 mg/dL — ABNORMAL HIGH (ref 70–99)
Glucose-Capillary: 101 mg/dL — ABNORMAL HIGH (ref 70–99)

## 2018-10-24 LAB — VITAMIN B12: Vitamin B-12: 871 pg/mL (ref 180–914)

## 2018-10-24 LAB — IRON AND TIBC
IRON: 45 ug/dL (ref 28–170)
Saturation Ratios: 16 % (ref 10.4–31.8)
TIBC: 285 ug/dL (ref 250–450)
UIBC: 240 ug/dL

## 2018-10-24 LAB — BRAIN NATRIURETIC PEPTIDE: B NATRIURETIC PEPTIDE 5: 211 pg/mL — AB (ref 0.0–100.0)

## 2018-10-24 LAB — PHOSPHORUS: PHOSPHORUS: 2.6 mg/dL (ref 2.5–4.6)

## 2018-10-24 LAB — TROPONIN I
Troponin I: <0.03 ng/mL (ref <0.03)
Troponin I: <0.03 ng/mL (ref <0.03)

## 2018-10-24 MED ORDER — POLYETHYLENE GLYCOL 3350 17 G PO PACK
17.0000 g | PACK | Freq: Every day | ORAL | Status: DC
  Administered 2018-10-24 – 2018-10-29 (×6): 17 g via ORAL
  Filled 2018-10-24 (×6): qty 1

## 2018-10-24 MED ORDER — ONDANSETRON HCL 4 MG/2ML IJ SOLN
4.0000 mg | Freq: Four times a day (QID) | INTRAMUSCULAR | Status: DC | PRN
  Filled 2018-10-24: qty 2

## 2018-10-24 MED ORDER — ASPIRIN 81 MG PO CHEW
81.0000 mg | CHEWABLE_TABLET | Freq: Every day | ORAL | Status: DC
  Administered 2018-10-24 – 2018-10-29 (×6): 81 mg via ORAL
  Filled 2018-10-24 (×6): qty 1

## 2018-10-24 MED ORDER — TAMSULOSIN HCL 0.4 MG PO CAPS
0.4000 mg | ORAL_CAPSULE | Freq: Every day | ORAL | Status: DC
  Administered 2018-10-24 – 2018-10-26 (×3): 0.4 mg via ORAL
  Filled 2018-10-24 (×3): qty 1

## 2018-10-24 MED ORDER — PAROXETINE HCL 20 MG PO TABS
40.0000 mg | ORAL_TABLET | Freq: Every day | ORAL | Status: DC
  Administered 2018-10-24 – 2018-10-29 (×6): 40 mg via ORAL
  Filled 2018-10-24 (×6): qty 2

## 2018-10-24 MED ORDER — ENOXAPARIN SODIUM 40 MG/0.4ML ~~LOC~~ SOLN
40.0000 mg | SUBCUTANEOUS | Status: DC
  Administered 2018-10-25 – 2018-10-29 (×5): 40 mg via SUBCUTANEOUS
  Filled 2018-10-24 (×5): qty 0.4

## 2018-10-24 MED ORDER — INSULIN ASPART 100 UNIT/ML ~~LOC~~ SOLN: 0.0000 [IU] | Freq: Every day | SUBCUTANEOUS | Status: DC

## 2018-10-24 MED ORDER — SENNOSIDES-DOCUSATE SODIUM 8.6-50 MG PO TABS
1.0000 | ORAL_TABLET | Freq: Every evening | ORAL | Status: DC | PRN
  Administered 2018-10-25: 1 via ORAL
  Filled 2018-10-24: qty 1

## 2018-10-24 MED ORDER — ONDANSETRON HCL 4 MG PO TABS: 4.0000 mg | ORAL_TABLET | ORAL | Status: DC | PRN

## 2018-10-24 MED ORDER — LINAGLIPTIN-METFORMIN HCL 2.5-500 MG PO TABS: 0.5000 | ORAL_TABLET | Freq: Every day | ORAL | Status: DC

## 2018-10-24 MED ORDER — PANTOPRAZOLE SODIUM 40 MG PO TBEC
40.0000 mg | DELAYED_RELEASE_TABLET | Freq: Every day | ORAL | Status: DC
  Administered 2018-10-24 – 2018-10-29 (×6): 40 mg via ORAL
  Filled 2018-10-24 (×6): qty 1

## 2018-10-24 MED ORDER — CLONAZEPAM 1 MG PO TABS
1.0000 mg | ORAL_TABLET | Freq: Every day | ORAL | Status: DC
  Administered 2018-10-24 – 2018-10-28 (×5): 1 mg via ORAL
  Filled 2018-10-24 (×5): qty 1

## 2018-10-24 MED ORDER — KETOROLAC TROMETHAMINE 15 MG/ML IJ SOLN
15.0000 mg | Freq: Four times a day (QID) | INTRAMUSCULAR | Status: AC | PRN
  Administered 2018-10-24 – 2018-10-28 (×9): 15 mg via INTRAVENOUS
  Filled 2018-10-24 (×9): qty 1

## 2018-10-24 MED ORDER — ATORVASTATIN CALCIUM 20 MG PO TABS
40.0000 mg | ORAL_TABLET | Freq: Every day | ORAL | Status: DC
  Administered 2018-10-24 – 2018-10-28 (×5): 40 mg via ORAL
  Filled 2018-10-24 (×5): qty 2

## 2018-10-24 MED ORDER — POTASSIUM CHLORIDE CRYS ER 10 MEQ PO TBCR
10.0000 meq | EXTENDED_RELEASE_TABLET | Freq: Every day | ORAL | Status: DC
  Administered 2018-10-24 – 2018-10-29 (×6): 10 meq via ORAL
  Filled 2018-10-24 (×6): qty 1

## 2018-10-24 MED ORDER — LEVOTHYROXINE SODIUM 100 MCG PO TABS
100.0000 ug | ORAL_TABLET | Freq: Every day | ORAL | Status: DC
  Administered 2018-10-24 – 2018-10-29 (×6): 100 ug via ORAL
  Filled 2018-10-24 (×6): qty 1

## 2018-10-24 MED ORDER — INSULIN ASPART 100 UNIT/ML ~~LOC~~ SOLN
0.0000 [IU] | Freq: Three times a day (TID) | SUBCUTANEOUS | Status: DC
  Administered 2018-10-26 – 2018-10-28 (×5): 1 [IU] via SUBCUTANEOUS
  Filled 2018-10-24 (×7): qty 1

## 2018-10-24 MED ORDER — LINAGLIPTIN 5 MG PO TABS
2.5000 mg | ORAL_TABLET | Freq: Every day | ORAL | Status: DC
  Administered 2018-10-24 – 2018-10-29 (×6): 2.5 mg via ORAL
  Filled 2018-10-24 (×6): qty 1

## 2018-10-24 MED ORDER — METFORMIN HCL 500 MG PO TABS
500.0000 mg | ORAL_TABLET | Freq: Every day | ORAL | Status: DC
  Administered 2018-10-24 – 2018-10-29 (×3): 500 mg via ORAL
  Filled 2018-10-24 (×4): qty 1

## 2018-10-24 MED ORDER — TRAZODONE HCL 50 MG PO TABS
25.0000 mg | ORAL_TABLET | Freq: Every day | ORAL | Status: DC
  Administered 2018-10-24 – 2018-10-28 (×5): 25 mg via ORAL
  Filled 2018-10-24 (×5): qty 1

## 2018-10-24 MED ORDER — ACETAMINOPHEN 650 MG RE SUPP: 650.0000 mg | Freq: Four times a day (QID) | RECTAL | Status: DC | PRN

## 2018-10-24 MED ORDER — LISINOPRIL 10 MG PO TABS
10.0000 mg | ORAL_TABLET | Freq: Every day | ORAL | Status: DC
  Administered 2018-10-24 – 2018-10-26 (×3): 10 mg via ORAL
  Filled 2018-10-24 (×3): qty 1

## 2018-10-24 MED ORDER — DOCUSATE SODIUM 100 MG PO CAPS
100.0000 mg | ORAL_CAPSULE | Freq: Two times a day (BID) | ORAL | Status: DC
  Administered 2018-10-24 – 2018-10-29 (×11): 100 mg via ORAL
  Filled 2018-10-24 (×11): qty 1

## 2018-10-24 MED ORDER — ONDANSETRON HCL 4 MG PO TABS: 4.0000 mg | ORAL_TABLET | Freq: Four times a day (QID) | ORAL | Status: DC | PRN

## 2018-10-24 MED ORDER — IBUPROFEN 600 MG PO TABS
600.0000 mg | ORAL_TABLET | Freq: Once | ORAL | Status: AC
  Administered 2018-10-24: 600 mg via ORAL
  Filled 2018-10-24: qty 1

## 2018-10-24 MED ORDER — BISACODYL 5 MG PO TBEC
5.0000 mg | DELAYED_RELEASE_TABLET | Freq: Every day | ORAL | Status: DC | PRN
  Administered 2018-10-25: 5 mg via ORAL
  Filled 2018-10-24: qty 1

## 2018-10-24 MED ORDER — MIRABEGRON ER 50 MG PO TB24
50.0000 mg | ORAL_TABLET | Freq: Every day | ORAL | Status: DC
  Administered 2018-10-24 – 2018-10-29 (×6): 50 mg via ORAL
  Filled 2018-10-24 (×6): qty 1

## 2018-10-24 MED ORDER — METOPROLOL TARTRATE 25 MG PO TABS
25.0000 mg | ORAL_TABLET | Freq: Every day | ORAL | Status: DC
  Administered 2018-10-24 – 2018-10-26 (×3): 25 mg via ORAL
  Filled 2018-10-24 (×3): qty 1

## 2018-10-24 MED ORDER — ADULT MULTIVITAMIN W/MINERALS CH
1.0000 | ORAL_TABLET | Freq: Every day | ORAL | Status: DC
  Administered 2018-10-24 – 2018-10-29 (×6): 1 via ORAL
  Filled 2018-10-24 (×6): qty 1

## 2018-10-24 MED ORDER — ACETAMINOPHEN 325 MG PO TABS
650.0000 mg | ORAL_TABLET | Freq: Four times a day (QID) | ORAL | Status: DC | PRN
  Administered 2018-10-24 – 2018-10-29 (×2): 650 mg via ORAL
  Filled 2018-10-24 (×2): qty 2

## 2018-10-24 MED ORDER — SODIUM CHLORIDE 0.9% FLUSH
3.0000 mL | Freq: Two times a day (BID) | INTRAVENOUS | Status: DC
  Administered 2018-10-24 – 2018-10-28 (×10): 3 mL via INTRAVENOUS

## 2018-10-24 MED ORDER — IPRATROPIUM-ALBUTEROL 0.5-2.5 (3) MG/3ML IN SOLN
3.0000 mL | Freq: Once | RESPIRATORY_TRACT | Status: AC
  Administered 2018-10-24: 3 mL via RESPIRATORY_TRACT
  Filled 2018-10-24: qty 3

## 2018-10-24 MED ORDER — BUPROPION HCL ER (XL) 150 MG PO TB24
300.0000 mg | ORAL_TABLET | Freq: Every day | ORAL | Status: DC
  Administered 2018-10-24 – 2018-10-29 (×6): 300 mg via ORAL
  Filled 2018-10-24 (×6): qty 2

## 2018-10-24 NOTE — Progress Notes (Signed)
[facility] [place] NAME: Catherine [name]    MR#:  [id number]  DATE OF BIRTH:  [birth year]  SUBJECTIVE:   Patient presents to the hospital secondary to presyncopal episodes and some relative hypotension and bradycardia.  Not orthostatic here in the hospital, no evidence of bradycardia.  Patient's pacemaker was interrogated by the ER apparently was malfunctional.   REVIEW OF SYSTEMS:    Review of Systems  Constitutional: Negative for chills and fever.  HENT: Negative for congestion and tinnitus.   Eyes: Negative for blurred vision and double vision.  Respiratory: Negative for cough, shortness of breath and wheezing.   Cardiovascular: Negative for chest pain, orthopnea and PND.  Gastrointestinal: Negative for abdominal pain, diarrhea, nausea and vomiting.  Genitourinary: Negative for dysuria and hematuria.  Neurological: Negative for dizziness, sensory change and focal weakness.  All other systems reviewed and are negative.   Nutrition: Heart healthy/Carb control Tolerating Diet: Yes Tolerating PT: Await Eval.   DRUG ALLERGIES:   Allergies  Allergen Reactions  . Sulfa Antibiotics Hives  . Latex Rash    VITALS:  Blood pressure (!) 127/48, pulse 73, temperature 98.6 F (37 C), temperature source Oral, resp. rate 18, height 5\' 3"  (1.6 m), weight 90.7 kg, SpO2 98 %.  PHYSICAL EXAMINATION:   Physical Exam  GENERAL:  77 y.o.-year-old patient lying in bed in no acute distress.  EYES: Pupils equal, round, reactive to light and accommodation. No scleral icterus. Extraocular muscles intact.  HEENT: Head atraumatic, normocephalic. Oropharynx and nasopharynx clear.  NECK:  Supple, no jugular venous distention. No thyroid enlargement, no tenderness.  LUNGS: Normal breath sounds bilaterally, no wheezing, rales, rhonchi. No use of accessory muscles of respiration.  CARDIOVASCULAR: S1, S2 normal. No murmurs, rubs, or gallops.  ABDOMEN:  Soft, nontender, nondistended. Bowel sounds present. No organomegaly or mass.  EXTREMITIES: No cyanosis, clubbing or edema b/l.    NEUROLOGIC: Cranial nerves II through XII are intact. No focal Motor or sensory deficits b/l.   PSYCHIATRIC: The patient is alert and oriented x 3.  SKIN: No obvious rash, lesion, or ulcer.    LABORATORY PANEL:   CBC Recent Labs  Lab 10/23/18 1805  WBC 8.5  HGB 9.4*  HCT 31.4*  PLT 240   ------------------------------------------------------------------------------------------------------------------  Chemistries  Recent Labs  Lab 10/23/18 1805 10/24/18 0501  NA 137  --   K 4.2  --   CL 105  --   CO2 26  --   GLUCOSE 95  --   BUN 21  --   CREATININE 1.29*  --   CALCIUM 9.4  --   MG  --  1.9  AST  --  20  ALT  --  11  ALKPHOS  --  40  BILITOT  --  0.6   ------------------------------------------------------------------------------------------------------------------  Cardiac Enzymes Recent Labs  Lab 10/24/18 0825  TROPONINI <0.03   ------------------------------------------------------------------------------------------------------------------  RADIOLOGY:  Ct Head Wo Contrast  Result Date: 10/23/2018 CLINICAL DATA:  Daughter st that pt has been confused and was concerned over a low potassium; also reports that they have noticed a left sided facial droop todayNeuro deficit(s), subacute EXAM: CT HEAD WITHOUT CONTRAST TECHNIQUE: Contiguous axial images were obtained from the base of the skull through the vertex without intravenous contrast. COMPARISON:  None. FINDINGS: Brain: No acute intracranial hemorrhage. No focal mass lesion. No CT evidence of acute infarction. No midline shift or mass effect. No hydrocephalus. Basilar cisterns are patent.  Mild periventricular white matter hypodensities and mild atrophy. Vascular: No hyperdense vessel or unexpected calcification. Skull: Normal. Negative for fracture or focal lesion. Sinuses/Orbits:  Paranasal sinuses and mastoid air cells are clear. Orbits are clear. Other: None. IMPRESSION: No acute intracranial findings. Electronically Signed   By: Suzy Bouchard M.D.   On: 10/23/2018 21:13     ASSESSMENT AND PLAN:   77 year old female with past medical history of fibromyalgia, essential hypertension, anxiety/depression, hyperlipidemia, hypothyroidism, status post recent aortic valve replacement, status post pacemaker who presents to the hospital due to dizziness and presyncope.  1.  Presyncope/dizziness-suspected to be secondary to orthostasis.  Patient appeared he had some hypotension and bradycardia at home but it has resolved now. - Patient was apparently told to stop taking her antihypertensives but currently is on them and is no longer orthostatic or hypotensive. -Patient's pacemaker was interrogated in the ER and was myofunctional.  Await further cardiology input.  Await echocardiogram results. - cardiac markers x3 are negative, CT head negative.  Orthostatics are (-).   2.  Essential hypertension-continue lisinopril, metoprolol.  No evidence of orthostasis.  3.  Anxiety-continue Paxil, Klonopin.  4.  Diabetes-continue metformin, Tradjenta, sliding scale insulin.  5.  GERD-continue Protonix.  6.  Urinary incontinence-continue Myrbetriq.  7.  Hyperlipidemia-continue atorvastatin.  Possible d/c home tomorrow   All the records are reviewed and case discussed with Care Management/Social Worker. Management plans discussed with the patient, family and they are in agreement.  CODE STATUS: Full code  DVT Prophylaxis: Lovenox  TOTAL TIME TAKING CARE OF THIS PATIENT: 30 minutes.   POSSIBLE D/C IN 1-2 DAYS, DEPENDING ON CLINICAL CONDITION.   Henreitta Leber M.D on 10/24/2018 at 3:26 PM  Between 7am to 6pm - Pager - [phone]  After 6pm go to [email or web address] - Proofreader  [facility]  Office  [phone]  CC: Primary care  physician; Jilda Panda, MD

## 2018-10-24 NOTE — Care Management Obs Status (Signed)
[place] NOTIFICATION   Patient Details  Name: BRYCE KIMBLE MRN: [id number] Date of Birth: 03/06/1941   Medicare Observation Status Notification Given:  Yes   Refused to sign.     Elza Rafter, RN 10/24/2018, 4:44 PM

## 2018-10-24 NOTE — Progress Notes (Signed)
*  PRELIMINARY RESULTS* Echocardiogram 2D Echocardiogram has been performed.  Catherine [name] 10/24/2018, 3:16 PM

## 2018-10-24 NOTE — ED Provider Notes (Signed)
Interrogation of the patient's pacemaker came back showing intermittent pacemaker malfunction and failure to capture.  The patient has blood pressures and heart rates documented throughout the course of the day including this morning around 10 AM when she had a blood pressure of 64/39 with a heart rate of 42.  At this point given her persistent intermittent bradycardia that is symptomatic she requires inpatient admission for telemetry, electrolyte replacement, and cardiology consultation for management of pacemaker malfunction.  I discussed the hospitalist who is graciously agreed to admit the patient to his service.   Darel Hong, MD 10/24/18 [phone]

## 2018-10-24 NOTE — Progress Notes (Deleted)
     Consulted for concern of possible PPM device malfunction. Patient has a Dike device. In the ED, they attempted to interrogate her device with [employer] equipment, which will not work. We have contacted the correct company to come out and interrogate the device. Her device appears to be functioning normally on EKG. Full consult to follow PPM interrogation.

## 2018-10-24 NOTE — ED Notes (Signed)
Interrogated pm for 2nd time. Waiting on fax report.

## 2018-10-24 NOTE — ED Notes (Signed)
Pt given dinner  

## 2018-10-24 NOTE — Care Management Note (Signed)
Case Management Note  Patient Details  Name: Catherine [name] MRN: [id number] Date of Birth: 1941-10-27  Subjective/Objective:   Patient is from home with husband.  Placed in observation for near syncope and dizziness.  Pacemaker was interrogated.  Pending cardiac input.  Denies difficulties obtaining medications or with medical care.  Uses a cane and a walker at home.   No current services in the home.  Currently on room air.  Daughter is at bedside and very supportive.  Will continue to follow.  MOON letter presented.  She did not want to sign.  Copy given to patient.               Action/Plan:   Expected Discharge Date:                  Expected Discharge Plan:     In-House Referral:     Discharge planning Services  CM Consult  Post Acute Care Choice:    Choice offered to:     DME Arranged:    DME Agency:     HH Arranged:    HH Agency:     Status of Service:  In process, will continue to follow  If discussed at Long Length of Stay Meetings, dates discussed:    Additional Comments:  Elza Rafter, RN 10/24/2018, 4:51 PM

## 2018-10-24 NOTE — Consult Note (Signed)
Cardiology Consultation:   Patient ID: Catherine [name]; [id number]; Feb 23, 1941   Admit date: 10/23/2018 Date of Consult: 10/24/2018  Primary Care Provider: Jilda Panda, MD Primary Cardiologist: Catherine [name] Primary Electrophysiologist:  Catherine [name]   Patient Profile:   Catherine [name] is a 77 y.o. female with a hx of CAD medically managed as below, aortic stenosis s/p TAVR in 01/2950 complicated by complete heart block s/p PPM complicated by  who is being seen today for the evaluation of possible PPM malfunciton at the request of Dr. Verdell [name].  History of Present Illness:   Catherine [name] underwent cath in 08/2015 showed single vessel CAD with heavy calcification of the entire LAD and a moderate eccentric mid LAD stenosis estimated at 70%. There was mild nonobstructive stenosis of the LCx and RCA. She had a severely calcified aortic valve with restricted mobility and 2+ aortic insufficiency with known severe aortic stenosis by prior noninvasive evaluation. Medical management was advised for her CAD along with treatment of her aortic valve disease. She underwent TAVR in 07/8415 which was complicated by complete heart block requiring PPM. Her PPM implantation was complicated by a hemothorax requiring chest tube placement. She was last seen by Catherine [name] in 01/2018 and was doing well from a cardiac perspective. Most recent echo from 03/2017 showed and EF of 60-65%, no RWMA, Gr1DD, normal functioning aortic valve replacement without stenosis or regurgitation.   She has more recently been dealing with frank hematuria and was found to have a kidney stone along with a renal mass, which has been felt to be benign based off CT urogram in 08/2017.    Patient presented to [facility] overnight with lightheadedness, presyncope, and shortness of breath.  She has noted some exertional shortness of breath over the past couple of months.  She has stable orthopnea.  She notes a trace lower extremity edema.  No early satiety.  Home  wrist BP cuff was giving erroneous heart rates and blood pressure readings prompting the patient to become concerned and requesting further evaluation.  No chest pain.  She has continued to note frank hematuria.  Upon patient's arrival to the ED she was noted to have stable vital signs.  Her pacemaker was interrogated with initial concern for device malfunction however upon [employer] rep and cardiology review of the interrogation her device is functioning normally and appropriately.  The patient had a brief run of atrial tachycardia on 10/20/2018 and has had no events since.  Her back-up pacing is set at 60 bpm.  Labs showed a hemoglobin of 9.4 with a baseline of approximately 11.8.  Cardiac enzymes have remained negative.  UA is negative for hemoglobin with 0-5 RBC per hpf.  Serum creatinine 1.29 with a baseline approximately 1.1.  Magnesium 1.9.  LFT unrevealing.  CT head nonacute.  EKG shows a sensed V paced rhythm with device functioning normally.  Telemetry shows device is functioning normally.  Cardiology asked to evaluate patient's pacemaker.  Past Medical History:  Diagnosis Date  . Acute colitis   . Anemia   . CAD (coronary artery disease)    a. heavy calcification of the entire LAD & mod eccentric mid LAD stenosis, estimated @ 70%, mild nonobs stenosis of RCA and LCx, severely calcified Ao valve with restricted valve mobility and 2+ AI    . Cancer [facility]) 1981   breast  . CHB (complete heart block) ([place]) 08/31/2015   [place] DR model SA6301 (serial number [id number] )   .  Chronic diastolic congestive heart failure ([place])    a. echo 08/31/2015: EF 65-70%, nl WM, GR1DD, Ao valve stent bioprosthesis was present and functioning nl, no regurg, LA mildly dilated, PASP 46 mm Hg  . Collagen vascular disease ([place])   . Colon polyps   . Depression   . Diabetes mellitus type II   . Fibromyalgia   . HTN (hypertension)   . Hypercholesteremia   . Hypothyroidism   . IBS (irritable  bowel syndrome)   . Rectal bleeding   . Rheumatoid arthritis(714.0)   . S/P TAVR (transcatheter aortic valve replacement) 08/30/2015   26 mm Edwards Sapien 3 transcatheter heart valve placed via open right transfemoral approach  . Shortness of breath dyspnea   . Stenosis of aortic valve   . Thyroid disease     Past Surgical History:  Procedure Laterality Date  . ABDOMINAL SURGERY     Intestinal surgery  . CARDIAC SURGERY    . CESAREAN SECTION     x 2  . EP IMPLANTABLE DEVICE N/A 08/31/2015   Procedure: Pacemaker Implant;  Surgeon: Will Meredith Leeds, MD; [place] (serial number [id number] ); Laterality: Right  . LEFT OOPHORECTOMY    . MASTECTOMY Left   . polyp     . self inflicted chest wound    . [place]   lower back  . TEE WITHOUT CARDIOVERSION N/A 08/30/2015   Procedure: TRANSESOPHAGEAL ECHOCARDIOGRAM (TEE);  Surgeon: Sherren Mocha, MD;  Location: [place];  Service: Open Heart Surgery;  Laterality: N/A;  . TOTAL ABDOMINAL HYSTERECTOMY    . TRANSCATHETER AORTIC VALVE REPLACEMENT, TRANSFEMORAL N/A 08/30/2015   Procedure: TRANSCATHETER AORTIC VALVE REPLACEMENT, TRANSFEMORAL;  Surgeon: Sherren Mocha, MD;  Location: [place];  Service: Open Heart Surgery;  Laterality: N/A;     Home Meds: Prior to Admission medications   Medication Sig Start Date End Date Taking? Authorizing Provider  aspirin EC 81 MG tablet Take 81 mg by mouth daily.    [provider]  atorvastatin (LIPITOR) 40 MG tablet Take 1 tablet by mouth daily. 01/01/18   [provider]  buPROPion (WELLBUTRIN XL) 300 MG 24 hr tablet Take 1 tablet (300 mg total) by mouth daily. 10/01/18   Arfeen, Arlyce Harman, MD  clonazePAM (KLONOPIN) 1 MG tablet TAKE 1 TABLET BY MOUTH AT BEDTIME 09/15/18   Arfeen, Arlyce Harman, MD  docusate sodium (COLACE) 100 MG capsule Take 100 mg by mouth 2 (two) times daily.    [provider]  KLOR-CON M10 10 MEQ tablet TAKE 1 TABLET BY MOUTH EVERY  DAY 10/01/16   Bhagat, Bhavinkumar, PA  levothyroxine (SYNTHROID, LEVOTHROID) 100 MCG tablet Take 100 mcg by mouth daily before breakfast.     [provider]  Linagliptin-Metformin HCl (JENTADUETO) 2.5-500 MG TABS Take 0.5 tablets by mouth daily with breakfast.     [provider]  lisinopril (PRINIVIL,ZESTRIL) 10 MG tablet TAKE 1 TABLET (10 MG TOTAL) BY MOUTH DAILY. 12/05/16   Sherren Mocha, MD  metoprolol tartrate (LOPRESSOR) 25 MG tablet Take 25 mg by mouth daily.     [provider]  mirabegron ER (MYRBETRIQ) 50 MG TB24 tablet Take 1 tablet (50 mg total) by mouth daily. 06/03/18   Zara Council A, PA-C  Multiple Vitamin (MULTIVITAMIN WITH MINERALS) TABS tablet Take 1 tablet by mouth daily at 12 noon.     [provider]  ondansetron (ZOFRAN) 4 MG tablet Take 4 mg by mouth as needed  for nausea or vomiting.    [provider]  pantoprazole (PROTONIX) 40 MG tablet Take 1 tablet (40 mg total) by mouth daily. 03/31/18   Belva Crome, MD  PARoxetine (PAXIL) 40 MG tablet Take 1 tablet (40 mg total) by mouth daily. 10/08/18   Arfeen, Arlyce Harman, MD  polyethylene glycol [facility]) packet Take 17 g by mouth daily. 04/13/17   Gregor Hams, MD  tamsulosin (FLOMAX) 0.4 MG CAPS capsule Take 0.4 mg by mouth daily after breakfast.    [provider]  traZODone (DESYREL) 50 MG tablet Take 0.5 tablets (25 mg total) by mouth at bedtime. TAKE 1/2 TABLET AT BED TIME. 10/01/18   Kathlee Nations, MD    Inpatient Medications: Scheduled Meds: . aspirin  81 mg Oral Daily  . atorvastatin  40 mg Oral Daily  . buPROPion  300 mg Oral Daily  . clonazePAM  1 mg Oral QHS  . docusate sodium  100 mg Oral BID  . enoxaparin (LOVENOX) injection  40 mg Subcutaneous Q24H  . insulin aspart  0-5 Units Subcutaneous QHS  . insulin aspart  0-9 Units Subcutaneous TID WC  . levothyroxine  100 mcg Oral QAC breakfast  . linagliptin  2.5 mg Oral Q breakfast   And  . metFORMIN  500  mg Oral Q breakfast  . lisinopril  10 mg Oral Daily  . metoprolol tartrate  25 mg Oral Daily  . mirabegron ER  50 mg Oral Daily  . multivitamin with minerals  1 tablet Oral Q1200  . pantoprazole  40 mg Oral Daily  . PARoxetine  40 mg Oral Daily  . polyethylene glycol  17 g Oral Daily  . potassium chloride  10 mEq Oral Daily  . sodium chloride flush  3 mL Intravenous Q12H  . tamsulosin  0.4 mg Oral QPC breakfast  . traZODone  25 mg Oral QHS   Continuous Infusions:  PRN Meds: acetaminophen **OR** acetaminophen, bisacodyl, ondansetron **OR** ondansetron (ZOFRAN) IV, ondansetron, senna-docusate  Allergies:   Allergies  Allergen Reactions  . Sulfa Antibiotics Hives  . Latex Rash    Social History:   Social History   Socioeconomic History  . Marital status: Married    Spouse name: Not on file  . Number of children: Not on file  . Years of education: Not on file  . Highest education level: Not on file  Occupational History  . Not on file  Social Needs  . Financial resource strain: Not on file  . Food insecurity:    Worry: Not on file    Inability: Not on file  . Transportation needs:    Medical: Not on file    Non-medical: Not on file  Tobacco Use  . Smoking status: Never Smoker  . Smokeless tobacco: Never Used  Substance and Sexual Activity  . Alcohol use: No    Alcohol/week: 0.0 standard drinks  . Drug use: No  . Sexual activity: Not Currently  Lifestyle  . Physical activity:    Days per week: Not on file    Minutes per session: Not on file  . Stress: Not on file  Relationships  . Social connections:    Talks on phone: Not on file    Gets together: Not on file    Attends religious service: Not on file    Active member of club or organization: Not on file    Attends meetings of clubs or organizations: Not on file    Relationship status:  Not on file  . Intimate partner violence:    Fear of current or ex partner: Not on file    Emotionally abused: Not on  file    Physically abused: Not on file    Forced sexual activity: Not on file  Other Topics Concern  . Not on file  Social History Narrative  . Not on file     Family History:   Family History  Problem Relation Age of Onset  . Depression Sister   . Depression Sister   . Depression Sister   . Hypertension Mother   . Lung cancer Sister   . Stroke Sister   . Hypertension Sister   . Heart attack Neg Hx     ROS:  Review of Systems  Constitutional: Positive for malaise/fatigue. Negative for chills, diaphoresis, fever and weight loss.  HENT: Negative for congestion.   Eyes: Negative for discharge and redness.  Respiratory: Positive for shortness of breath. Negative for cough, hemoptysis, sputum production and wheezing.   Cardiovascular: Negative for chest pain, palpitations, orthopnea, claudication, leg swelling and PND.  Gastrointestinal: Negative for abdominal pain, blood in stool, heartburn, melena, nausea and vomiting.  Genitourinary: Positive for hematuria.  Musculoskeletal: Negative for falls and myalgias.  Skin: Negative for rash.  Neurological: Positive for weakness. Negative for dizziness, tingling, tremors, sensory change, speech change, focal weakness and loss of consciousness.  Endo/Heme/Allergies: Does not bruise/bleed easily.  Psychiatric/Behavioral: Negative for substance abuse. The patient is not nervous/anxious.   All other systems reviewed and are negative.     Physical Exam/Data:   Vitals:   10/24/18 0558 10/24/18 0601 10/24/18 0821 10/24/18 1407  BP: 128/76 (!) 114/40 (!) 127/48   Pulse: 74 73 66 73  Resp:   18   Temp:   98.6 F (37 C)   TempSrc:   Oral   SpO2: 97% 97% 96% 98%  Weight:      Height:        Intake/Output Summary (Last 24 hours) at 10/24/2018 1536 Last data filed at 10/24/2018 1405 Gross per 24 hour  Intake 363 ml  Output 200 ml  Net 163 ml   Filed Weights   10/23/18 1802  Weight: 90.7 kg   Body mass index is 35.43 kg/m.    Physical Exam: General: Pale appearing, well developed, in no acute distress. Head: Normocephalic, atraumatic, sclera non-icteric, no xanthomas, nares without discharge.  Neck: Negative for carotid bruits. JVD not elevated. Lungs: Clear bilaterally to auscultation without wheezes, rales, or rhonchi. Breathing is unlabored. Heart: RRR with S1 S2, no murmurs, rubs, or gallops appreciated. Abdomen: Soft, non-tender, non-distended with normoactive bowel sounds. No hepatomegaly. No rebound/guarding. No obvious abdominal masses. Msk:  Strength and tone appear normal for age. Extremities: No clubbing or cyanosis. No edema. Distal pedal pulses are 2+ and equal bilaterally. Neuro: Alert and oriented X 3. No facial asymmetry. No focal deficit. Moves all extremities spontaneously. Psych:  Responds to questions appropriately with a normal affect.   EKG:  The EKG was personally reviewed and demonstrates: A-sensed V-paced , 69 bpm Telemetry:  Telemetry was personally reviewed and demonstrates: paced, 60s bpm, rare PVC  Weights: Filed Weights   10/23/18 1802  Weight: 90.7 kg    Relevant CV Studies: Echo 03/2017: Study Conclusions  - Left ventricle: The cavity size was normal. There was moderate   concentric hypertrophy. Systolic function was normal. The   estimated ejection fraction was in the range of 60% to 65%. Wall   motion  was normal; there were no regional wall motion   abnormalities. Doppler parameters are consistent with abnormal   left ventricular relaxation (grade 1 diastolic dysfunction).   Doppler parameters are consistent with elevated ventricular   end-diastolic filling pressure. - Aortic valve: Transvalvular velocity was within the normal range.   There was no stenosis. There was no regurgitation. Mean gradient   (S): 4 mm Hg. Peak gradient (S): 8 mm Hg. Valve area (VTI): 2.64   cm^2. Valve area (Vmax): 2.48 cm^2. Valve area (Vmean): 3.06   cm^2. - Aortic root: The aortic  root was normal in size. - Mitral valve: There was mild regurgitation. - Left atrium: The atrium was mildly dilated. - Right ventricle: Pacer wire or catheter noted in right ventricle. - Right atrium: Pacer wire or catheter noted in right atrium. - Tricuspid valve: There was trivial regurgitation. - Pulmonic valve: There was trivial regurgitation. - Inferior vena cava: The vessel was normal in size. - Pericardium, extracardiac: There was no pericardial effusion.  Impressions:  - LVEF 60-65%.   A TAVR bioprosthesis sits well in the aortic position. There are   normal transaortic gradients and unchaged from prior on   10/09/2016.   There is no central AI or paravalvular leak. __________  Echo 10/24/2018 pending  Laboratory Data:  Chemistry Recent Labs  Lab 10/23/18 1805  NA 137  K 4.2  CL 105  CO2 26  GLUCOSE 95  BUN 21  CREATININE 1.29*  CALCIUM 9.4  GFRNONAA 38*  GFRAA 44*  ANIONGAP 6    Recent Labs  Lab 10/24/18 0501  PROT 6.3*  ALBUMIN 3.5  AST 20  ALT 11  ALKPHOS 40  BILITOT 0.6   Hematology Recent Labs  Lab 10/23/18 1805 10/24/18 1247  WBC 8.5  --   RBC 3.43* 3.16*  HGB 9.4*  --   HCT 31.4*  --   MCV 91.5  --   MCH 27.4  --   MCHC 29.9*  --   RDW 15.8*  --   PLT 240  --    Cardiac Enzymes Recent Labs  Lab 10/23/18 1805 10/24/18 0501 10/24/18 0825  TROPONINI <0.03 <0.03 <0.03   No results for input(s): TROPIPOC in the last 168 hours.  BNPNo results for input(s): BNP, PROBNP in the last 168 hours.  DDimer No results for input(s): DDIMER in the last 168 hours.  Radiology/Studies:  Ct Head Wo Contrast  Result Date: 10/23/2018 IMPRESSION: No acute intracranial findings. Electronically Signed   By: Suzy Bouchard M.D.   On: 10/23/2018 21:13    Assessment and Plan:   1.  Generalized malaise/fatigue/shortness of breath: -Likely multifactorial including obesity, physical deconditioning, and worsening anemia -No evidence of pacemaker  device malfunction as noted below -Would recommend work-up of the patient's anemia as well as physical therapy as she is clearly deconditioned -Cannot exclude some component of volume overload -Check echocardiogram and BNP  2.  History of complete heart block status post pacemaker: -Device is functioning normally on EKG, telemetry, and device interrogation during this admission -Home reported heart rates in the 40s are likely erroneous in the setting of the patient's wrist BP cuff -We have recommended she obtain a new brachial BP cuff -Continue to monitor while on telemetry and admitted  3.  CAD: -Troponin negative -No symptoms of angina -Echo pending -Continue ASA, metoprolol, and Lipitor -No plans for inpatient ischemic evaluation unless echo demonstrates newly reduced EF or wall motion abnormalities concerning for ischemia  4.  Aortic stenosis status post TAVR: -Obtain echocardiogram  5.  Anemia: -Likely contributing to the patient's generalized malaise, fatigue, and shortness of breath -Work-up per internal medicine  6.  CKD stage III: -Appears stable  7.  Hematuria/benign renal mass: -UA this admission without any blood noted -Per IM   For questions or updates, please contact [place] Please consult [email or web address] for contact info under Cardiology/STEMI.   Signed, Christell Faith, PA-C [place] Pager: [phone] 10/24/2018, 3:36 PM

## 2018-10-24 NOTE — H&P (Signed)
[facility] [place] NAME: Catherine [name]    MR#:  [id number]  DATE OF BIRTH:  July 07, 1941  DATE OF ADMISSION:  10/23/2018  PRIMARY CARE PHYSICIAN: Jilda Panda, MD   REQUESTING/REFERRING PHYSICIAN: Darel Hong, MD  CHIEF COMPLAINT:   Chief Complaint  Patient presents with  . Weakness    HISTORY OF PRESENT ILLNESS:  Catherine [name]  is a 77 y.o. female with a known history of SSS/CHB (s/p St. Jude PPM), AS (s/p TAVR) p/w lightheadedness/near-syncope. Pt is such a dismal historian that I had to make sure she was AAOx3 during my Hx. She is indeed oriented, and does not appear to exhibit dementia. She is telling me all sorts of nonsense about something that happened months ago, and seems rather incapable of providing cohesive history pertaining to her present hospitalization. All I am able to gather is that she has had fatigue/malaise/generalized weakness, lightheadedness and near-syncope, multiple/recurrent episodes x2d. She checked her vitals @~1000AM on Thursday 10/23/2018, BP 62/39, HR 42. She says she sees Cardiologist Dr. Burt Knack in [place]. Her PPM was placed in 2016. BP and HR are WNL at the time of my assessment. Pt appears well, and is in no acute distress.  PAST MEDICAL HISTORY:   Past Medical History:  Diagnosis Date  . Acute colitis   . Anemia   . CAD (coronary artery disease)    a. heavy calcification of the entire LAD & mod eccentric mid LAD stenosis, estimated @ 70%, mild nonobs stenosis of RCA and LCx, severely calcified Ao valve with restricted valve mobility and 2+ AI    . Cancer [facility]) 1981   breast  . CHB (complete heart block) ([place]) 08/31/2015   [place] DR model [id number] (serial number [id number] )   . Chronic diastolic congestive heart failure ([place])    a. echo 08/31/2015: EF 65-70%, nl WM, GR1DD, Ao valve stent bioprosthesis was present and functioning nl, no regurg, LA mildly dilated, PASP 46 mm Hg  .  Collagen vascular disease ([place])   . Colon polyps   . Depression   . Diabetes mellitus type II   . Fibromyalgia   . HTN (hypertension)   . Hypercholesteremia   . Hypothyroidism   . IBS (irritable bowel syndrome)   . Rectal bleeding   . Rheumatoid arthritis(714.0)   . S/P TAVR (transcatheter aortic valve replacement) 08/30/2015   26 mm Edwards Sapien 3 transcatheter heart valve placed via open right transfemoral approach  . Shortness of breath dyspnea   . Stenosis of aortic valve   . Thyroid disease     PAST SURGICAL HISTORY:   Past Surgical History:  Procedure Laterality Date  . ABDOMINAL SURGERY     Intestinal surgery  . CARDIAC SURGERY    . CESAREAN SECTION     x 2  . EP IMPLANTABLE DEVICE N/A 08/31/2015   Procedure: Pacemaker Implant;  Surgeon: Will Meredith Leeds, MD; [place] (serial number [id number] ); Laterality: Right  . LEFT OOPHORECTOMY    . MASTECTOMY Left   . polyp     . self inflicted chest wound    . [place]   lower back  . TEE WITHOUT CARDIOVERSION N/A 08/30/2015   Procedure: TRANSESOPHAGEAL ECHOCARDIOGRAM (TEE);  Surgeon: Sherren Mocha, MD;  Location: [place];  Service: Open Heart Surgery;  Laterality: N/A;  . TOTAL ABDOMINAL HYSTERECTOMY    . TRANSCATHETER AORTIC VALVE REPLACEMENT, TRANSFEMORAL  N/A 08/30/2015   Procedure: TRANSCATHETER AORTIC VALVE REPLACEMENT, TRANSFEMORAL;  Surgeon: Sherren Mocha, MD;  Location: [place];  Service: Open Heart Surgery;  Laterality: N/A;    SOCIAL HISTORY:   Social History   Tobacco Use  . Smoking status: Never Smoker  . Smokeless tobacco: Never Used  Substance Use Topics  . Alcohol use: No    Alcohol/week: 0.0 standard drinks    FAMILY HISTORY:   Family History  Problem Relation Age of Onset  . Depression Sister   . Depression Sister   . Depression Sister   . Hypertension Mother   . Lung cancer Sister   . Stroke Sister   . Hypertension Sister   . Heart attack Neg Hx      DRUG ALLERGIES:   Allergies  Allergen Reactions  . Sulfa Antibiotics Hives  . Latex Rash    REVIEW OF SYSTEMS:   Review of Systems  Unable to perform ROS: Mental acuity  Constitutional: Positive for malaise/fatigue.  Neurological: Positive for dizziness and weakness. Negative for loss of consciousness.   MEDICATIONS AT HOME:   Prior to Admission medications   Medication Sig Start Date End Date Taking? Authorizing Provider  aspirin EC 81 MG tablet Take 81 mg by mouth daily.    [provider]  atorvastatin (LIPITOR) 40 MG tablet Take 1 tablet by mouth daily. 01/01/18   [provider]  buPROPion (WELLBUTRIN XL) 300 MG 24 hr tablet Take 1 tablet (300 mg total) by mouth daily. 10/01/18   Arfeen, Arlyce Harman, MD  clonazePAM (KLONOPIN) 1 MG tablet TAKE 1 TABLET BY MOUTH AT BEDTIME 09/15/18   Arfeen, Arlyce Harman, MD  docusate sodium (COLACE) 100 MG capsule Take 100 mg by mouth 2 (two) times daily.    [provider]  KLOR-CON M10 10 MEQ tablet TAKE 1 TABLET BY MOUTH EVERY DAY 10/01/16   Bhagat, Bhavinkumar, PA  levothyroxine (SYNTHROID, LEVOTHROID) 100 MCG tablet Take 100 mcg by mouth daily before breakfast.     [provider]  Linagliptin-Metformin HCl (JENTADUETO) 2.5-500 MG TABS Take 0.5 tablets by mouth daily with breakfast.     [provider]  lisinopril (PRINIVIL,ZESTRIL) 10 MG tablet TAKE 1 TABLET (10 MG TOTAL) BY MOUTH DAILY. 12/05/16   Sherren Mocha, MD  metoprolol tartrate (LOPRESSOR) 25 MG tablet Take 25 mg by mouth daily.     [provider]  mirabegron ER (MYRBETRIQ) 50 MG TB24 tablet Take 1 tablet (50 mg total) by mouth daily. 06/03/18   Zara Council A, PA-C  Multiple Vitamin (MULTIVITAMIN WITH MINERALS) TABS tablet Take 1 tablet by mouth daily at 12 noon.     [provider]  ondansetron (ZOFRAN) 4 MG tablet Take 4 mg by mouth as needed for nausea or vomiting.    [provider]  pantoprazole (PROTONIX)  40 MG tablet Take 1 tablet (40 mg total) by mouth daily. 03/31/18   Belva Crome, MD  PARoxetine (PAXIL) 40 MG tablet Take 1 tablet (40 mg total) by mouth daily. 10/08/18   Arfeen, Arlyce Harman, MD  polyethylene glycol [facility]) packet Take 17 g by mouth daily. 04/13/17   Gregor Hams, MD  tamsulosin (FLOMAX) 0.4 MG CAPS capsule Take 0.4 mg by mouth daily after breakfast.    [provider]  traZODone (DESYREL) 50 MG tablet Take 0.5 tablets (25 mg total) by mouth at bedtime. TAKE 1/2 TABLET AT BED TIME. 10/01/18   Arfeen, Arlyce Harman, MD  VITAL SIGNS:  Blood pressure (!) 114/40, pulse 73, temperature 98.4 F (36.9 C), temperature source Oral, resp. rate 20, height 5\' 3"  (1.6 m), weight 90.7 kg, SpO2 97 %.  PHYSICAL EXAMINATION:  Physical Exam  Constitutional: She is oriented to person, place, and time. She appears well-developed and well-nourished. She is active and cooperative.  Non-toxic appearance. She does not have a sickly appearance. She does not appear ill. No distress.  HENT:  Head: Atraumatic.  Mouth/Throat: Oropharynx is clear and moist. No oropharyngeal exudate.  Eyes: Conjunctivae, EOM and lids are normal. No scleral icterus.  Neck: Neck supple. No JVD present. No thyromegaly present.  Cardiovascular: Normal rate, regular rhythm and normal heart sounds. Exam reveals no gallop and no friction rub.  No murmur heard. Pulmonary/Chest: Effort normal and breath sounds normal. No stridor. No respiratory distress. She has no wheezes. She has no rales.  Abdominal: Soft. Bowel sounds are normal. She exhibits no distension. There is no tenderness. There is no rebound and no guarding.  Musculoskeletal: Normal range of motion. She exhibits no edema or tenderness.  Lymphadenopathy:    She has no cervical adenopathy.  Neurological: She is alert and oriented to person, place, and time.  Skin: Skin is warm, dry and intact. No rash noted. She is not diaphoretic. No erythema.    Psychiatric: She has a normal mood and affect. Her behavior is normal. Judgment and thought content normal. Her mood appears not anxious. Her affect is not angry, not blunt, not labile and not inappropriate. Her speech is tangential. Her speech is not rapid and/or pressured, not delayed and not slurred. She is not agitated, not aggressive, not hyperactive, not slowed, not withdrawn, not actively hallucinating and not combative. Cognition and memory are normal. She does not express impulsivity or inappropriate judgment. She does not exhibit a depressed mood. She is communicative. She is attentive.   LABORATORY PANEL:   CBC Recent Labs  Lab 10/23/18 1805  WBC 8.5  HGB 9.4*  HCT 31.4*  PLT 240   ------------------------------------------------------------------------------------------------------------------  Chemistries  Recent Labs  Lab 10/23/18 1805 10/24/18 0501  NA 137  --   K 4.2  --   CL 105  --   CO2 26  --   GLUCOSE 95  --   BUN 21  --   CREATININE 1.29*  --   CALCIUM 9.4  --   MG  --  1.9  AST  --  20  ALT  --  11  ALKPHOS  --  40  BILITOT  --  0.6   ------------------------------------------------------------------------------------------------------------------  Cardiac Enzymes Recent Labs  Lab 10/24/18 0501  TROPONINI <0.03   ------------------------------------------------------------------------------------------------------------------  RADIOLOGY:  Ct Head Wo Contrast  Result Date: 10/23/2018 CLINICAL DATA:  Daughter st that pt has been confused and was concerned over a low potassium; also reports that they have noticed a left sided facial droop todayNeuro deficit(s), subacute EXAM: CT HEAD WITHOUT CONTRAST TECHNIQUE: Contiguous axial images were obtained from the base of the skull through the vertex without intravenous contrast. COMPARISON:  None. FINDINGS: Brain: No acute intracranial hemorrhage. No focal mass lesion. No CT evidence of acute  infarction. No midline shift or mass effect. No hydrocephalus. Basilar cisterns are patent. Mild periventricular white matter hypodensities and mild atrophy. Vascular: No hyperdense vessel or unexpected calcification. Skull: Normal. Negative for fracture or focal lesion. Sinuses/Orbits: Paranasal sinuses and mastoid air cells are clear. Orbits are clear. Other: None. IMPRESSION: No acute intracranial findings. Electronically Signed  By: Suzy Bouchard M.D.   On: 10/23/2018 21:13   IMPRESSION AND PLAN:   A/P: 33F SSS/CHB (s/p St. Jude PPM), AS (s/p TAVR) p/w lightheadedness/near-syncope. Cr elevation/CKD III, hypoproteinemia, normocytic anemia. -LH/near-syncope: PPM interrogated. Unable to interpret report. Concern for device malfunction. Cardiology consult. Trop-I (-), rpt pending. Echo pending. Tele, continuous cardiac monitoring. ASA. Orthostatic VS, FSG, neuro checks q4h x24hr, fall precautions. IVF. -Cr elevation, CKD III: Cr 1.29 on admission. Cr range 1.1-1.6 on prior labwork. CKD likely 2/2 DM, HTN, aged kidney. Monitor BMP, avoid nephrotoxins. -Normocytic anemia: Likely anemia of chronic disease. No evidence of active/acute blood loss at present time. -c/w home meds/formulary subs. -FEN/GI: Cardiac diabetic diet. -DVT PPx: Lovenox. -Code status: Full code. -Disposition: Observation, < 2 midnights.   All the records are reviewed and case discussed with ED provider. Management plans discussed with the patient, family and they are in agreement.  CODE STATUS: Full code.  TOTAL TIME TAKING CARE OF THIS PATIENT: 75 minutes.    Arta Silence M.D on 10/24/2018 at 6:18 AM  Between 7am to 6pm - Pager - [phone]  After 6pm go to [email or web address] - Proofreader  [facility]  Office  [phone]  CC: Primary care physician; Jilda Panda, MD   Note: This dictation was prepared with Dragon dictation along with smaller phrase technology. Any  transcriptional errors that result from this process are unintentional.

## 2018-10-25 ENCOUNTER — Observation Stay: Payer: Medicare Other

## 2018-10-25 LAB — GLUCOSE, CAPILLARY
GLUCOSE-CAPILLARY: 142 mg/dL — AB (ref 70–99)
GLUCOSE-CAPILLARY: 128 mg/dL — AB (ref 70–99)
GLUCOSE-CAPILLARY: 89 mg/dL (ref 70–99)
GLUCOSE-CAPILLARY: 101 mg/dL — AB (ref 70–99)
Glucose-Capillary: 117 mg/dL — ABNORMAL HIGH (ref 70–99)

## 2018-10-25 MED ORDER — IPRATROPIUM-ALBUTEROL 0.5-2.5 (3) MG/3ML IN SOLN
3.0000 mL | Freq: Four times a day (QID) | RESPIRATORY_TRACT | Status: DC
  Administered 2018-10-25 – 2018-10-26 (×2): 3 mL via RESPIRATORY_TRACT
  Filled 2018-10-25 (×4): qty 3

## 2018-10-25 MED ORDER — ALUM & MAG HYDROXIDE-SIMETH 200-200-20 MG/5ML PO SUSP
15.0000 mL | Freq: Four times a day (QID) | ORAL | Status: DC | PRN
  Administered 2018-10-25: 15 mL via ORAL
  Filled 2018-10-25: qty 30

## 2018-10-25 MED ORDER — BUDESONIDE 0.5 MG/2ML IN SUSP
0.5000 mg | Freq: Two times a day (BID) | RESPIRATORY_TRACT | Status: DC
  Administered 2018-10-25 – 2018-10-29 (×8): 0.5 mg via RESPIRATORY_TRACT
  Filled 2018-10-25 (×7): qty 2

## 2018-10-25 MED ORDER — IPRATROPIUM-ALBUTEROL 0.5-2.5 (3) MG/3ML IN SOLN
3.0000 mL | RESPIRATORY_TRACT | Status: DC | PRN
  Administered 2018-10-25: 3 mL via RESPIRATORY_TRACT
  Filled 2018-10-25: qty 3

## 2018-10-25 NOTE — Clinical Social Work Note (Signed)
CSW received call from patient's attending RN that the family is concerned about the patient returning home. CSW attempted to contact the patient's daughter and left a HIPPA compliant voicemail. CSW will continue to attempt contact as able and has updated the [facility] as RNCM is following the patient.  Santiago Bumpers, MSW, Latanya Presser [phone]

## 2018-10-25 NOTE — Evaluation (Addendum)
Physical Therapy Evaluation Patient Details Name: Catherine [name] MRN: [id number] DOB: 10/30/1941 Today's Date: 10/25/2018   History of Present Illness  77 yo female with onset of L facial droop, syncope, L pleural effusion, atelectasis and pleural effusion, has suspected stroke but cleared.  PMHx:  pacemaker,   Clinical Impression  Pt is ready to progress PT to standign balance and strengthening. Has limited tolerance for gait due to low endurance but will try to gain later.  Work acutely on standing balance and no signs of falls,     Follow Up Recommendations Home health PT;Supervision for mobility/OOB    Equipment Recommendations  None recommended by PT    Recommendations for Other Services       Precautions / Restrictions Precautions Precautions: Fall Precaution Comments: O2 via nasal cannula Restrictions Weight Bearing Restrictions: No      Mobility  Bed Mobility Overal bed mobility: Independent;Needs Assistance Bed Mobility: Supine to Sit;Sit to Sidelying     Supine to sit: Min assist   Sit to sidelying: Min assist    Transfers Overall transfer level: Needs assistance Equipment used: Rolling walker (2 wheeled) Transfers: Sit to/from Stand Sit to Stand: Min assist         General transfer comment: cues for hand placement  Ambulation/Gait Ambulation/Gait assistance: Min assist;Min guard Gait Distance (Feet): 50 Feet Assistive device: Rolling walker (2 wheeled) Gait Pattern/deviations: Step-through pattern;Decreased stride length;Wide base of support(careful turns) Gait velocity: reduced Gait velocity interpretation: <1.31 ft/sec, indicative of household ambulator    Stairs            Wheelchair Mobility    Modified Rankin (Stroke Patients Only)       Balance Overall balance assessment: Needs assistance Sitting-balance support: Feet supported Sitting balance-Leahy Scale: Fair     Standing balance support: Bilateral upper extremity  supported Standing balance-Leahy Scale: Fair                               Pertinent Vitals/Pain Pain Assessment: No/denies pain    Home Living Family/patient expects to be discharged to:: Private residence Living Arrangements: Spouse/significant other   Type of Home: House Home Access: Stairs to enter Entrance Stairs-Rails: Left;Can reach Advertising account executive of Steps: 5 Home Layout: One level Home Equipment: None      Prior Function Level of Independence: Independent with assistive device(s)               Hand Dominance   Dominant Hand: Right    Extremity/Trunk Assessment   Upper Extremity Assessment Upper Extremity Assessment: Overall WFL for tasks assessed    Lower Extremity Assessment Lower Extremity Assessment: Overall WFL for tasks assessed    Cervical / Trunk Assessment Cervical / Trunk Assessment: Normal  Communication   Communication: No difficulties  Cognition Arousal/Alertness: Awake/alert Behavior During Therapy: WFL for tasks assessed/performed Overall Cognitive Status: Within Functional Limits for tasks assessed                                        General Comments      Exercises     Assessment/Plan    PT Assessment Patient needs continued PT services  PT Problem List Decreased strength;Decreased range of motion;Decreased activity tolerance;Decreased balance;Decreased mobility;Decreased coordination;Decreased cognition;Decreased knowledge of use of DME;Decreased safety awareness;Decreased knowledge of precautions;Cardiopulmonary status limiting activity;Decreased skin integrity  PT Treatment Interventions DME instruction;Gait training;Stair training;Functional mobility training;Therapeutic exercise;Therapeutic activities;Balance training;Neuromuscular re-education;Patient/family education    PT Goals (Current goals can be found in the Care Plan section)  Acute Rehab PT Goals Patient  Stated Goal: to walk and get home Time For Goal Achievement: 11/08/18 Potential to Achieve Goals: Good    Frequency Min 2X/week   Barriers to discharge Inaccessible home environment stairs to enter house    Co-evaluation               AM-PAC PT "6 Clicks" Mobility  Outcome Measure Help needed turning from your back to your side while in a flat bed without using bedrails?: A Little Help needed moving from lying on your back to sitting on the side of a flat bed without using bedrails?: A Little Help needed moving to and from a bed to a chair (including a wheelchair)?: A Little Help needed standing up from a chair using your arms (e.g., wheelchair or bedside chair)?: A Little Help needed to walk in hospital room?: A Little Help needed climbing 3-5 steps with a railing? : A Lot 6 Click Score: 17    End of Session Equipment Utilized During Treatment: Gait belt Activity Tolerance: Patient tolerated treatment well Patient left: in bed;with call bell/phone within reach;with bed alarm set;with family/visitor present Nurse Communication: Mobility status PT Visit Diagnosis: Unsteadiness on feet (R26.81)    Time: 3546-5681 PT Time Calculation (min) (ACUTE ONLY): 30 min   Charges:   PT Evaluation $PT Eval Moderate Complexity: 1 Mod PT Treatments $Gait Training: 8-22 mins       Ramond Dial 10/25/2018, 10:29 PM   Mee Hives, PT MS Acute Rehab Dept. Number: [place] [facility] [place]

## 2018-10-25 NOTE — Progress Notes (Signed)
Patient was wheezing when auscaultated this morning. Dr. Coy Saunas notified with an order for Duoneb breathing tx  PRN. Order implemented as received. Will continue to monitor.notified with a new order for

## 2018-10-25 NOTE — Progress Notes (Signed)
PT Cancellation Note  Patient Details Name: Catherine [name] MRN: [id number] DOB: [birth year]   Cancelled Treatment:    Reason Eval/Treat Not Completed: Medical issues which prohibited therapy.  Family in to see pt and noted her impending ck of LE's for DVT given the pain.  Will try again at another time.   Ramond Dial 10/25/2018, 1:38 PM   Mee Hives, PT MS Acute Rehab Dept. Number: [place] and [place]

## 2018-10-25 NOTE — Progress Notes (Signed)
Patient ID: Catherine [name], female   DOB: October 15, 1941, 77 y.o.   MRN: [id number]  [facility] PROGRESS NOTE  Catherine [name] [id number] DOB: 10-22-1941 DOA: 10/23/2018 PCP: Catherine Panda, MD  HPI/Subjective: Patient not feeling well.  States she is wheezing and coughing.  Daughter thinks she may have caught something being in the emergency room near the exit door where was cold.  Has pain behind her right knee.  Objective: Vitals:   10/25/18 0806 10/25/18 1200  BP: (!) 124/51 (!) 128/48  Pulse: 65 64  Resp:    Temp: 98.2 F (36.8 C) 97.8 F (36.6 C)  SpO2: 96% 96%    Intake/Output Summary (Last 24 hours) at 10/25/2018 1350 Last data filed at 10/25/2018 1016 Gross per 24 hour  Intake 960 ml  Output 1300 ml  Net -340 ml   Filed Weights   10/23/18 1802 10/25/18 0318  Weight: 90.7 kg 90.6 kg    ROS: Review of Systems  Constitutional: Negative for chills and fever.  Eyes: Negative for blurred vision.  Respiratory: Positive for cough, shortness of breath and wheezing.   Cardiovascular: Negative for chest pain.  Gastrointestinal: Negative for abdominal pain, constipation, diarrhea, nausea and vomiting.  Genitourinary: Negative for dysuria.  Musculoskeletal: Negative for joint pain.  Neurological: Negative for dizziness and headaches.   Exam: Physical Exam  Constitutional: She is oriented to person, place, and time.  HENT:  Nose: No mucosal edema.  Mouth/Throat: No oropharyngeal exudate or posterior oropharyngeal edema.  Eyes: Pupils are equal, round, and reactive to light. Conjunctivae, EOM and lids are normal.  Neck: No JVD present. Carotid bruit is not present. No edema present. No thyroid mass and no thyromegaly present.  Cardiovascular: S1 normal and S2 normal. Exam reveals no gallop.  No murmur heard. Pulses:      Dorsalis pedis pulses are 2+ on the right side, and 2+ on the left side.  Respiratory: No respiratory distress. She has no decreased breath sounds.  She has no wheezes. She has no rhonchi. She has no rales.  When she breathes through her nose her lungs are clear.  When she breathes through her mouth I hear upper airway congestion.  GI: Soft. Bowel sounds are normal. There is no tenderness.  Musculoskeletal:       Right ankle: She exhibits swelling.       Left ankle: She exhibits swelling.  Lymphadenopathy:    She has no cervical adenopathy.  Neurological: She is alert and oriented to person, place, and time. No cranial nerve deficit.  Skin: Skin is warm. No rash noted. Nails show no clubbing.  Psychiatric: She has a normal mood and affect.      Data Reviewed: Basic Metabolic Panel: Recent Labs  Lab 10/23/18 1805 10/24/18 0501  NA 137  --   K 4.2  --   CL 105  --   CO2 26  --   GLUCOSE 95  --   BUN 21  --   CREATININE 1.29*  --   CALCIUM 9.4  --   MG  --  1.9  PHOS  --  2.6   Liver Function Tests: Recent Labs  Lab 10/24/18 0501  AST 20  ALT 11  ALKPHOS 40  BILITOT 0.6  PROT 6.3*  ALBUMIN 3.5   CBC: Recent Labs  Lab 10/23/18 1805  WBC 8.5  HGB 9.4*  HCT 31.4*  MCV 91.5  PLT 240   Cardiac Enzymes: Recent Labs  Lab 10/23/18 1805 10/24/18  0501 10/24/18 0825  TROPONINI <0.03 <0.03 <0.03   BNP (last 3 results) Recent Labs    10/24/18 1556  BNP 211.0*     CBG: Recent Labs  Lab 10/24/18 1634 10/24/18 2047 10/25/18 0557 10/25/18 0822 10/25/18 1214  GLUCAP 95 115* 142* 117* 128*      Studies: Ct Head Wo Contrast  Result Date: 10/23/2018 CLINICAL DATA:  Daughter st that pt has been confused and was concerned over a low potassium; also reports that they have noticed a left sided facial droop todayNeuro deficit(s), subacute EXAM: CT HEAD WITHOUT CONTRAST TECHNIQUE: Contiguous axial images were obtained from the base of the skull through the vertex without intravenous contrast. COMPARISON:  None. FINDINGS: Brain: No acute intracranial hemorrhage. No focal mass lesion. No CT evidence of acute  infarction. No midline shift or mass effect. No hydrocephalus. Basilar cisterns are patent. Mild periventricular white matter hypodensities and mild atrophy. Vascular: No hyperdense vessel or unexpected calcification. Skull: Normal. Negative for fracture or focal lesion. Sinuses/Orbits: Paranasal sinuses and mastoid air cells are clear. Orbits are clear. Other: None. IMPRESSION: No acute intracranial findings. Electronically Signed   By: Suzy Bouchard M.D.   On: 10/23/2018 21:13   Dg Chest Port 1 View  Result Date: 10/25/2018 CLINICAL DATA:  Patient with wheezing. EXAM: PORTABLE CHEST 1 VIEW COMPARISON:  Chest radiograph 09/15/2018 FINDINGS: Multi lead pacer apparatus overlies the right hemithorax. Stable cardiac and mediastinal contours. Left basilar heterogeneous opacities. Probable small left pleural effusion. No pneumothorax. IMPRESSION: Minimal left basilar atelectasis and probable small left pleural effusion. Electronically Signed   By: Lovey Newcomer M.D.   On: 10/25/2018 11:02    Scheduled Meds: . aspirin  81 mg Oral Daily  . atorvastatin  40 mg Oral Daily  . budesonide (PULMICORT) nebulizer solution  0.5 mg Nebulization BID  . buPROPion  300 mg Oral Daily  . clonazePAM  1 mg Oral QHS  . docusate sodium  100 mg Oral BID  . enoxaparin (LOVENOX) injection  40 mg Subcutaneous Q24H  . insulin aspart  0-5 Units Subcutaneous QHS  . insulin aspart  0-9 Units Subcutaneous TID WC  . ipratropium-albuterol  3 mL Nebulization Q6H  . levothyroxine  100 mcg Oral QAC breakfast  . linagliptin  2.5 mg Oral Q breakfast   And  . metFORMIN  500 mg Oral Q breakfast  . lisinopril  10 mg Oral Daily  . metoprolol tartrate  25 mg Oral Daily  . mirabegron ER  50 mg Oral Daily  . multivitamin with minerals  1 tablet Oral Q1200  . pantoprazole  40 mg Oral Daily  . PARoxetine  40 mg Oral Daily  . polyethylene glycol  17 g Oral Daily  . potassium chloride  10 mEq Oral Daily  . sodium chloride flush  3 mL  Intravenous Q12H  . tamsulosin  0.4 mg Oral QPC breakfast  . traZODone  25 mg Oral QHS   Continuous Infusions:  Assessment/Plan:  1. Cough shortness of breath and wheeze.  I think this is upper airway congestion.  Started on nebulizer treatments.  Chest x-ray ordered by me is negative for pneumonia.  No need for antibiotics. 2. Pain behind right knee.  Ultrasound to rule out DVT. 3. Presyncope and dizziness.  Continue to check orthostatic vital signs. 4. Weakness.  Physical therapy evaluation 5. Anxiety continue psychiatric medications 6. Type 2 diabetes continue oral medications 7. Hyperlipidemia unspecified on atorvastatin 8. Urinary incontinence continue usual medications  Code Status:  Code Status Orders  (From admission, onward)         Start     Ordered   10/24/18 0549  Full code  Continuous     10/24/18 0548        Code Status History    Date Active Date Inactive Code Status Order ID Comments User Context   03/09/2017 2114 03/12/2017 1857 Full Code [id number]  Sid Falcon, MD Inpatient   09/24/2015 1546 09/26/2015 1821 Full Code [id number]  Gladstone Lighter, MD Inpatient   08/31/2015 1341 09/07/2015 1658 Full Code [id number]  Constance Haw, MD Inpatient   08/30/2015 1423 08/31/2015 1341 Full Code [id number]  Sherren Mocha, MD Inpatient   08/11/2015 1424 08/11/2015 2238 Full Code [id number]  Sherren Mocha, MD Inpatient     Family Communication: Daughter at the bedside Disposition Plan: Hopefully home tomorrow  Consultants:  Cardiology  Time spent: 32 minutes  [place]

## 2018-10-26 LAB — GLUCOSE, CAPILLARY
GLUCOSE-CAPILLARY: 125 mg/dL — AB (ref 70–99)
GLUCOSE-CAPILLARY: 76 mg/dL (ref 70–99)
GLUCOSE-CAPILLARY: 125 mg/dL — AB (ref 70–99)
Glucose-Capillary: 116 mg/dL — ABNORMAL HIGH (ref 70–99)

## 2018-10-26 MED ORDER — BISACODYL 10 MG RE SUPP
10.0000 mg | Freq: Every day | RECTAL | Status: DC | PRN
  Administered 2018-10-27: 10 mg via RECTAL
  Filled 2018-10-26: qty 1

## 2018-10-26 MED ORDER — SODIUM CHLORIDE 0.9 % IV BOLUS
500.0000 mL | Freq: Once | INTRAVENOUS | Status: AC
  Administered 2018-10-26: 500 mL via INTRAVENOUS

## 2018-10-26 MED ORDER — IPRATROPIUM-ALBUTEROL 0.5-2.5 (3) MG/3ML IN SOLN
3.0000 mL | Freq: Two times a day (BID) | RESPIRATORY_TRACT | Status: DC
  Administered 2018-10-26 – 2018-10-29 (×6): 3 mL via RESPIRATORY_TRACT
  Filled 2018-10-26 (×6): qty 3

## 2018-10-26 NOTE — Plan of Care (Signed)

## 2018-10-26 NOTE — Progress Notes (Signed)
Patient ID: Catherine [name], female   DOB: 11-Apr-1941, 77 y.o.   MRN: [id number]  [facility] PROGRESS NOTE  Catherine [name] QPY:[id number] DOB: January 25, 1941 DOA: 10/23/2018 PCP: Jilda Panda, MD  HPI/Subjective: Patient does not feel well.  Still having wheeze especially at night.  Some cough.  Some shortness of breath.  Still having pain behind her knee.  Was orthostatic this morning.  Feeling weak.  Objective: Vitals:   10/26/18 0812 10/26/18 1400  BP: 112/60 137/60  Pulse: 79 64  Resp:  18  Temp:    SpO2: 97%     Intake/Output Summary (Last 24 hours) at 10/26/2018 1449 Last data filed at 10/26/2018 1416 Gross per 24 hour  Intake 720 ml  Output 1200 ml  Net -480 ml   Filed Weights   10/23/18 1802 10/25/18 0318 10/26/18 0234  Weight: 90.7 kg 90.6 kg 90.9 kg    ROS: Review of Systems  Constitutional: Positive for malaise/fatigue. Negative for chills and fever.  Eyes: Negative for blurred vision.  Respiratory: Positive for cough, shortness of breath and wheezing.   Cardiovascular: Negative for chest pain.  Gastrointestinal: Negative for abdominal pain, constipation, diarrhea, nausea and vomiting.  Genitourinary: Negative for dysuria.  Musculoskeletal: Negative for joint pain.  Neurological: Negative for dizziness and headaches.   Exam: Physical Exam  Constitutional: She is oriented to person, place, and time.  HENT:  Nose: No mucosal edema.  Mouth/Throat: No oropharyngeal exudate or posterior oropharyngeal edema.  Eyes: Pupils are equal, round, and reactive to light. Conjunctivae, EOM and lids are normal.  Neck: No JVD present. Carotid bruit is not present. No edema present. No thyroid mass and no thyromegaly present.  Cardiovascular: S1 normal and S2 normal. Exam reveals no gallop.  No murmur heard. Pulses:      Dorsalis pedis pulses are 2+ on the right side, and 2+ on the left side.  Respiratory: No respiratory distress. She has no decreased breath sounds. She  has no wheezes. She has no rhonchi. She has no rales.  When she breathes through her nose her lungs are clear.  When she breathes through her mouth I hear upper airway congestion.  GI: Soft. Bowel sounds are normal. There is no tenderness.  Musculoskeletal:       Right ankle: She exhibits swelling.       Left ankle: She exhibits swelling.  Lymphadenopathy:    She has no cervical adenopathy.  Neurological: She is alert and oriented to person, place, and time. No cranial nerve deficit.  Skin: Skin is warm. No rash noted. Nails show no clubbing.  Psychiatric: She has a normal mood and affect.      Data Reviewed: Basic Metabolic Panel: Recent Labs  Lab 10/23/18 1805 10/24/18 0501  NA 137  --   K 4.2  --   CL 105  --   CO2 26  --   GLUCOSE 95  --   BUN 21  --   CREATININE 1.29*  --   CALCIUM 9.4  --   MG  --  1.9  PHOS  --  2.6   Liver Function Tests: Recent Labs  Lab 10/24/18 0501  AST 20  ALT 11  ALKPHOS 40  BILITOT 0.6  PROT 6.3*  ALBUMIN 3.5   CBC: Recent Labs  Lab 10/23/18 1805  WBC 8.5  HGB 9.4*  HCT 31.4*  MCV 91.5  PLT 240   Cardiac Enzymes: Recent Labs  Lab 10/23/18 1805 10/24/18 0501 10/24/18 0825  TROPONINI <0.03 <0.03 <0.03   BNP (last 3 results) Recent Labs    10/24/18 1556  BNP 211.0*     CBG: Recent Labs  Lab 10/25/18 1214 10/25/18 1716 10/25/18 2059 10/26/18 0814 10/26/18 1203  GLUCAP 128* 89 101* 125* 76      Studies: US Venous Img Lower Bilateral  Result Date: 10/25/2018 CLINICAL DATA:  Bilateral lower extremity pain and edema. EXAM: BILATERAL LOWER EXTREMITY VENOUS DOPPLER ULTRASOUND TECHNIQUE: Gray-scale sonography with graded compression, as well as color Doppler and duplex ultrasound were performed to evaluate the lower extremity deep venous systems from the level of the common femoral vein and including the common femoral, femoral, profunda femoral, popliteal and calf veins including the posterior tibial, peroneal  and gastrocnemius veins when visible. The superficial great saphenous vein was also interrogated. Spectral Doppler was utilized to evaluate flow at rest and with distal augmentation maneuvers in the common femoral, femoral and popliteal veins. COMPARISON:  02/02/2014 FINDINGS: RIGHT LOWER EXTREMITY Common Femoral Vein: No evidence of thrombus. Normal compressibility, respiratory phasicity and response to augmentation. Saphenofemoral Junction: No evidence of thrombus. Normal compressibility and flow on color Doppler imaging. Profunda Femoral Vein: No evidence of thrombus. Normal compressibility and flow on color Doppler imaging. Femoral Vein: No evidence of thrombus. Normal compressibility, respiratory phasicity and response to augmentation. Popliteal Vein: No evidence of thrombus. Normal compressibility, respiratory phasicity and response to augmentation. Calf Veins: No evidence of thrombus. Normal compressibility and flow on color Doppler imaging. Superficial Great Saphenous Vein: No evidence of thrombus. Normal compressibility. Venous Reflux:  None. Other Findings: No evidence of superficial thrombophlebitis or abnormal fluid collection. LEFT LOWER EXTREMITY Common Femoral Vein: No evidence of thrombus. Normal compressibility, respiratory phasicity and response to augmentation. Saphenofemoral Junction: No evidence of thrombus. Normal compressibility and flow on color Doppler imaging. Profunda Femoral Vein: No evidence of thrombus. Normal compressibility and flow on color Doppler imaging. Femoral Vein: No evidence of thrombus. Normal compressibility, respiratory phasicity and response to augmentation. Popliteal Vein: No evidence of thrombus. Normal compressibility, respiratory phasicity and response to augmentation. Calf Veins: No evidence of thrombus. Normal compressibility and flow on color Doppler imaging. Superficial Great Saphenous Vein: No evidence of thrombus. Normal compressibility. Venous Reflux:  None.  Other Findings: No evidence of superficial thrombophlebitis or abnormal fluid collection. IMPRESSION: No evidence of bilateral lower extremity deep venous thrombosis. Electronically Signed   By: [name] M.D.   On: 10/25/2018 14:08   Dg Chest Port 1 View  Result Date: 10/25/2018 CLINICAL DATA:  Patient with wheezing. EXAM: PORTABLE CHEST 1 VIEW COMPARISON:  Chest radiograph 09/15/2018 FINDINGS: Multi lead pacer apparatus overlies the right hemithorax. Stable cardiac and mediastinal contours. Left basilar heterogeneous opacities. Probable small left pleural effusion. No pneumothorax. IMPRESSION: Minimal left basilar atelectasis and probable small left pleural effusion. Electronically Signed   By: Lovey Newcomer M.D.   On: 10/25/2018 11:02    Scheduled Meds: . aspirin  81 mg Oral Daily  . atorvastatin  40 mg Oral Daily  . budesonide (PULMICORT) nebulizer solution  0.5 mg Nebulization BID  . buPROPion  300 mg Oral Daily  . clonazePAM  1 mg Oral QHS  . docusate sodium  100 mg Oral BID  . enoxaparin (LOVENOX) injection  40 mg Subcutaneous Q24H  . insulin aspart  0-5 Units Subcutaneous QHS  . insulin aspart  0-9 Units Subcutaneous TID WC  . ipratropium-albuterol  3 mL Nebulization BID  . levothyroxine  100 mcg Oral QAC breakfast  .  linagliptin  2.5 mg Oral Q breakfast   And  . metFORMIN  500 mg Oral Q breakfast  . mirabegron ER  50 mg Oral Daily  . multivitamin with minerals  1 tablet Oral Q1200  . pantoprazole  40 mg Oral Daily  . PARoxetine  40 mg Oral Daily  . polyethylene glycol  17 g Oral Daily  . potassium chloride  10 mEq Oral Daily  . sodium chloride flush  3 mL Intravenous Q12H  . traZODone  25 mg Oral QHS   Continuous Infusions:  Assessment/Plan:  1. Orthostatic hypotension and presyncope.  Discontinue lisinopril, metoprolol and Flomax.  IV fluid bolus today and reassess.   2. Cough shortness of breath and wheeze.  I think this is upper airway congestion.  Started  yesterday on nebulizer treatments.  Chest x-ray ordered by me is negative for pneumonia.  No need for antibiotics. 3. Pain behind right knee.  Ultrasound is negative for DVT or Baker's cyst. 4. Weakness.  Physical therapy evaluation recommends home with home health 5. Anxiety continue psychiatric medications 6. Type 2 diabetes continue oral medications 7. Hyperlipidemia unspecified on atorvastatin 8. Urinary incontinence continue usual medications 9. Chronic kidney disease stage III  Code Status:     Code Status Orders  (From admission, onward)         Start     Ordered   10/24/18 0549  Full code  Continuous     10/24/18 0548        Code Status History    Date Active Date Inactive Code Status Order ID Comments User Context   03/09/2017 2114 03/12/2017 1857 Full Code [id number]  Sid Falcon, MD Inpatient   09/24/2015 1546 09/26/2015 1821 Full Code [id number]  Gladstone Lighter, MD Inpatient   08/31/2015 1341 09/07/2015 1658 Full Code [id number]  Constance Haw, MD Inpatient   08/30/2015 1423 08/31/2015 1341 Full Code [id number]  Sherren Mocha, MD Inpatient   08/11/2015 1424 08/11/2015 2238 Full Code [id number]  Sherren Mocha, MD Inpatient     Family Communication: Daughter at the bedside Disposition Plan: Hopefully home tomorrow  Consultants:  Cardiology  Time spent: 28 minutes  [place]

## 2018-10-27 ENCOUNTER — Observation Stay: Payer: Medicare Other

## 2018-10-27 LAB — BASIC METABOLIC PANEL
Anion gap: 4 — ABNORMAL LOW (ref 5–15)
BUN: 23 mg/dL (ref 8–23)
CALCIUM: 9 mg/dL (ref 8.9–10.3)
CO2: 26 mmol/L (ref 22–32)
CREATININE: 1.54 mg/dL — AB (ref 0.44–1.00)
Chloride: 106 mmol/L (ref 98–111)
GFR calc Af Amer: 36 mL/min — ABNORMAL LOW (ref >60)
GFR, EST NON AFRICAN AMERICAN: 31 mL/min — AB (ref >60)
GLUCOSE: 108 mg/dL — AB (ref 70–99)
Potassium: 4.8 mmol/L (ref 3.5–5.1)
SODIUM: 136 mmol/L (ref 135–145)

## 2018-10-27 LAB — CBC
HCT: 28.6 % — ABNORMAL LOW (ref 36.0–46.0)
Hemoglobin: 8.7 g/dL — ABNORMAL LOW (ref 12.0–15.0)
MCH: 27.3 pg (ref 26.0–34.0)
MCHC: 30.4 g/dL (ref 30.0–36.0)
MCV: 89.7 fL (ref 80.0–100.0)
Platelets: 213 10*3/uL (ref 150–400)
RBC: 3.19 MIL/uL — ABNORMAL LOW (ref 3.87–5.11)
RDW: 16.1 % — AB (ref 11.5–15.5)
WBC: 6.7 10*3/uL (ref 4.0–10.5)
nRBC: 0 % (ref 0.0–0.2)

## 2018-10-27 LAB — GLUCOSE, CAPILLARY
GLUCOSE-CAPILLARY: 122 mg/dL — AB (ref 70–99)
GLUCOSE-CAPILLARY: 121 mg/dL — AB (ref 70–99)
Glucose-Capillary: 94 mg/dL (ref 70–99)
Glucose-Capillary: 116 mg/dL — ABNORMAL HIGH (ref 70–99)

## 2018-10-27 LAB — URINALYSIS, COMPLETE (UACMP) WITH MICROSCOPIC
Bilirubin Urine: NEGATIVE
Glucose, UA: NEGATIVE mg/dL
Hgb urine dipstick: NEGATIVE
KETONES UR: NEGATIVE mg/dL
LEUKOCYTES UA: NEGATIVE
NITRITE: NEGATIVE
Protein, ur: NEGATIVE mg/dL
SPECIFIC GRAVITY, URINE: 1.008 (ref 1.005–1.030)
pH: 6 (ref 5.0–8.0)

## 2018-10-27 LAB — OCCULT BLOOD X 1 CARD TO LAB, STOOL: Fecal Occult Bld: NEGATIVE

## 2018-10-27 MED ORDER — SODIUM CHLORIDE 0.9 % IV SOLN
200.0000 mg | Freq: Once | INTRAVENOUS | Status: AC
  Administered 2018-10-27: 200 mg via INTRAVENOUS
  Filled 2018-10-27: qty 10

## 2018-10-27 NOTE — Progress Notes (Signed)
Physical Therapy Treatment Patient Details Name: Catherine [name] MRN: [id number] DOB: Sep 27, 1941 Today's Date: 10/27/2018    History of Present Illness presented to ER secondary to progressive weakness, reported hypotension and pre-syncopal feelings; admitted for syncope work up.  PPM interrogated, working normally per cardiology.  Vitals running stable since admission.    PT Comments    Patient eager for OOB attempts with therapist this date.  Completes all movement transitions with RW, cga from therapist.  Mildly tremulous at times, but no overt buckling or LOB noted (patient reports this is baseline for her).  Vitals stable and WFL; no orthostasis noted (see vitals flowsheet for details).  Higher level balance and functional endurance deficits persist; will continue to address in subsequent sessions.    Follow Up Recommendations  Home health PT([facility]OT, [facility], [facility])     Equipment Recommendations  Rolling walker with 5" wheels    Recommendations for Other Services       Precautions / Restrictions Precautions Precautions: Fall Restrictions Weight Bearing Restrictions: No    Mobility  Bed Mobility Overal bed mobility: Modified Independent                Transfers Overall transfer level: Needs assistance Equipment used: Rolling walker (2 wheeled) Transfers: Sit to/from Stand Sit to Stand: Min guard         General transfer comment: cuing for hand placement, tends to pull on RW  Ambulation/Gait Ambulation/Gait assistance: Min guard Gait Distance (Feet): 60 Feet Assistive device: Rolling walker (2 wheeled)       General Gait Details: reciprocal stepping pattern with decreased step height/length, decreased balance reactions; mild tremulousness at times, but no overt buckling or LOB.  Additional distance limited by fatigue (BORG 7-8/10), but vitals stable and WFL.   Stairs             Wheelchair Mobility    Modified Rankin (Stroke Patients Only)        Balance Overall balance assessment: Needs assistance Sitting-balance support: No upper extremity supported;Feet supported Sitting balance-Leahy Scale: Good     Standing balance support: Bilateral upper extremity supported Standing balance-Leahy Scale: Fair                              Cognition Arousal/Alertness: Awake/alert Behavior During Therapy: WFL for tasks assessed/performed Overall Cognitive Status: Within Functional Limits for tasks assessed                                        Exercises Other Exercises Other Exercises: Seated LE therex, 1x15, AROM For muscular strength/endurance: ankle pumps, LAQs, chair push-ups.  Min cuing for technique and for slower, controlled motions    General Comments        Pertinent Vitals/Pain Pain Assessment: No/denies pain    Home Living                      Prior Function            PT Goals (current goals can now be found in the care plan section) Acute Rehab PT Goals Patient Stated Goal: to walk and get home Time For Goal Achievement: 11/08/18 Potential to Achieve Goals: Good Progress towards PT goals: Progressing toward goals    Frequency    Min 2X/week      PT Plan Current plan remains  appropriate    Co-evaluation              AM-PAC PT "6 Clicks" Mobility   Outcome Measure  Help needed turning from your back to your side while in a flat bed without using bedrails?: A Little Help needed moving from lying on your back to sitting on the side of a flat bed without using bedrails?: A Little Help needed moving to and from a bed to a chair (including a wheelchair)?: A Little Help needed standing up from a chair using your arms (e.g., wheelchair or bedside chair)?: A Little Help needed to walk in hospital room?: A Little Help needed climbing 3-5 steps with a railing? : A Little 6 Click Score: 18    End of Session Equipment Utilized During Treatment: Gait  belt Activity Tolerance: Patient tolerated treatment well Patient left: in chair;with call bell/phone within reach;with family/visitor present(chair alarm not present in room; CNA to connect as patient mobilizes next.  Patient/family aware of need for assist with all mobility) Nurse Communication: Mobility status PT Visit Diagnosis: Unsteadiness on feet (R26.81);Difficulty in walking, not elsewhere classified (R26.2)     Time: 1638-4536 PT Time Calculation (min) (ACUTE ONLY): 27 min  Charges:  $Gait Training: 8-22 mins $Therapeutic Exercise: 8-22 mins                     Lorriane Dehart H. Owens Shark, PT, DPT, NCS 10/27/18, 1:24 PM [phone]

## 2018-10-27 NOTE — Progress Notes (Addendum)
Patient ID: Catherine [name], female   DOB: 09-20-1941, 77 y.o.   MRN: [id number]  [employer] PROGRESS NOTE  Catherine [name] ZHG:[id number] DOB: 1941/03/29 DOA: 10/23/2018 PCP: Jilda Panda, MD  HPI/Subjective: States she is still feeling weak and dizzy with ambulation. No chest pain or palpitations.  Objective: Vitals:   10/27/18 0917 10/27/18 1317  BP: (!) 150/63   Pulse: 81   Resp: 18   Temp: 98.4 F (36.9 C)   SpO2: 97% 97%    Intake/Output Summary (Last 24 hours) at 10/27/2018 1638 Last data filed at 10/27/2018 1400 Gross per 24 hour  Intake 360 ml  Output 1750 ml  Net -1390 ml   Filed Weights   10/25/18 0318 10/26/18 0234 10/27/18 0403  Weight: 90.6 kg 90.9 kg 92.4 kg    ROS: Review of Systems  Constitutional: Positive for malaise/fatigue. Negative for chills and fever.  Eyes: Negative for blurred vision.  Respiratory: Positive for cough and shortness of breath. Negative for wheezing.   Cardiovascular: Negative for chest pain.  Gastrointestinal: Negative for abdominal pain, constipation, diarrhea, nausea and vomiting.  Genitourinary: Negative for dysuria.  Musculoskeletal: Negative for joint pain.  Neurological: Positive for dizziness and headaches.   Exam: Physical Exam  Constitutional: She is oriented to person, place, and time.  HENT:  Nose: No mucosal edema.  Mouth/Throat: No oropharyngeal exudate or posterior oropharyngeal edema.  Eyes: Pupils are equal, round, and reactive to light. Conjunctivae, EOM and lids are normal.  Neck: No JVD present. Carotid bruit is not present. No edema present. No thyroid mass and no thyromegaly present.  Cardiovascular: S1 normal and S2 normal. Exam reveals no gallop.  No murmur heard. Pulses:      Dorsalis pedis pulses are 2+ on the right side, and 2+ on the left side.  Respiratory: No respiratory distress. She has no decreased breath sounds. She has no wheezes. She has no rhonchi. She has no rales.  Diminished  breath sounds in the lung bases bilaterally  GI: Soft. Bowel sounds are normal. There is no tenderness.  Lymphadenopathy:    She has no cervical adenopathy.  Neurological: She is alert and oriented to person, place, and time. No cranial nerve deficit.  Skin: Skin is warm. No rash noted. Nails show no clubbing.  Psychiatric: She has a normal mood and affect.      Data Reviewed: Basic Metabolic Panel: Recent Labs  Lab 10/23/18 1805 10/24/18 0501 10/27/18 0420  NA 137  --  136  K 4.2  --  4.8  CL 105  --  106  CO2 26  --  26  GLUCOSE 95  --  108*  BUN 21  --  23  CREATININE 1.29*  --  1.54*  CALCIUM 9.4  --  9.0  MG  --  1.9  --   PHOS  --  2.6  --    Liver Function Tests: Recent Labs  Lab 10/24/18 0501  AST 20  ALT 11  ALKPHOS 40  BILITOT 0.6  PROT 6.3*  ALBUMIN 3.5   CBC: Recent Labs  Lab 10/23/18 1805 10/27/18 0420  WBC 8.5 6.7  HGB 9.4* 8.7*  HCT 31.4* 28.6*  MCV 91.5 89.7  PLT 240 213   Cardiac Enzymes: Recent Labs  Lab 10/23/18 1805 10/24/18 0501 10/24/18 0825  TROPONINI <0.03 <0.03 <0.03   BNP (last 3 results) Recent Labs    10/24/18 1556  BNP 211.0*     CBG: Recent Labs  Lab  10/26/18 1203 10/26/18 1726 10/26/18 2052 10/27/18 0817 10/27/18 1212  GLUCAP 76 116* 125* 122* 94      Studies: No results found.  Scheduled Meds: . aspirin  81 mg Oral Daily  . atorvastatin  40 mg Oral Daily  . budesonide (PULMICORT) nebulizer solution  0.5 mg Nebulization BID  . buPROPion  300 mg Oral Daily  . clonazePAM  1 mg Oral QHS  . docusate sodium  100 mg Oral BID  . enoxaparin (LOVENOX) injection  40 mg Subcutaneous Q24H  . insulin aspart  0-5 Units Subcutaneous QHS  . insulin aspart  0-9 Units Subcutaneous TID WC  . ipratropium-albuterol  3 mL Nebulization BID  . levothyroxine  100 mcg Oral QAC breakfast  . linagliptin  2.5 mg Oral Q breakfast   And  . metFORMIN  500 mg Oral Q breakfast  . mirabegron ER  50 mg Oral Daily  .  multivitamin with minerals  1 tablet Oral Q1200  . pantoprazole  40 mg Oral Daily  . PARoxetine  40 mg Oral Daily  . polyethylene glycol  17 g Oral Daily  . potassium chloride  10 mEq Oral Daily  . sodium chloride flush  3 mL Intravenous Q12H  . traZODone  25 mg Oral QHS   Continuous Infusions:  Assessment/Plan:  1. Orthostatic hypotension and presyncope- still having some episodes of lightheadedness with ambulation.  Discontinued lisinopril, metoprolol and Flomax. Orthostatics negative today.  2. Shortness of breath/dyspnea on exertion- previous CXR negative. Has not been on antibiotics. Recheck CXR.  3. Iron deficiency anemia- Hgb decreased from 11.5 > 9.4 > 8.7. May be dilutional component, as all cell lines have decreased. Patient endorses hematuria. Check FOBT and recheck UA to look for RBCs. Ferritin 17 this admission. Will give a dose of IV iron. 4. Pain behind right knee.  Ultrasound is negative for DVT or Baker's cyst. 5. Weakness.  Plan for home with HHPT. 6. Anxiety- continue psychiatric medications 7. Type 2 diabetes- continue oral medications 8. Hyperlipidemia- continue atorvastatin 9. Urinary incontinence continue home meds but holding flomax 10. Chronic kidney disease stage III- Creatinine at baseline. Monitor.  Code Status:     Code Status Orders  (From admission, onward)         Start     Ordered   10/24/18 0549  Full code  Continuous     10/24/18 0548        Code Status History    Date Active Date Inactive Code Status Order ID Comments User Context   03/09/2017 2114 03/12/2017 1857 Full Code [id number]  Sid Falcon, MD Inpatient   09/24/2015 1546 09/26/2015 1821 Full Code [id number]  Gladstone Lighter, MD Inpatient   08/31/2015 1341 09/07/2015 1658 Full Code [id number]  Constance Haw, MD Inpatient   08/30/2015 1423 08/31/2015 1341 Full Code [id number]  Sherren Mocha, MD Inpatient   08/11/2015 1424 08/11/2015 2238 Full Code [id number]  Sherren Mocha, MD  Inpatient     Family Communication: Husband at bedside. Disposition Plan: Discharge home with home health tomorrow  Consultants:  Cardiology  Time spent: 45 minutes  [place]

## 2018-10-28 DIAGNOSIS — R0602 Shortness of breath: Secondary | ICD-10-CM

## 2018-10-28 DIAGNOSIS — R531 Weakness: Secondary | ICD-10-CM | POA: Diagnosis not present

## 2018-10-28 LAB — CBC
HCT: 28.4 % — ABNORMAL LOW (ref 36.0–46.0)
HEMOGLOBIN: 8.6 g/dL — AB (ref 12.0–15.0)
MCH: 27.3 pg (ref 26.0–34.0)
MCHC: 30.3 g/dL (ref 30.0–36.0)
MCV: 90.2 fL (ref 80.0–100.0)
NRBC: 0 % (ref 0.0–0.2)
PLATELETS: 200 10*3/uL (ref 150–400)
RBC: 3.15 MIL/uL — AB (ref 3.87–5.11)
RDW: 16.1 % — ABNORMAL HIGH (ref 11.5–15.5)
WBC: 6.9 10*3/uL (ref 4.0–10.5)

## 2018-10-28 LAB — BASIC METABOLIC PANEL
ANION GAP: 9 (ref 5–15)
BUN: 23 mg/dL (ref 8–23)
CHLORIDE: 102 mmol/L (ref 98–111)
CO2: 24 mmol/L (ref 22–32)
Calcium: 9 mg/dL (ref 8.9–10.3)
Creatinine, Ser: 1.38 mg/dL — ABNORMAL HIGH (ref 0.44–1.00)
GFR, EST AFRICAN AMERICAN: 41 mL/min — AB (ref >60)
GFR, EST NON AFRICAN AMERICAN: 35 mL/min — AB (ref >60)
Glucose, Bld: 137 mg/dL — ABNORMAL HIGH (ref 70–99)
Potassium: 4.9 mmol/L (ref 3.5–5.1)
SODIUM: 135 mmol/L (ref 135–145)

## 2018-10-28 LAB — GLUCOSE, CAPILLARY
GLUCOSE-CAPILLARY: 122 mg/dL — AB (ref 70–99)
GLUCOSE-CAPILLARY: 122 mg/dL — AB (ref 70–99)
Glucose-Capillary: 146 mg/dL — ABNORMAL HIGH (ref 70–99)
Glucose-Capillary: 84 mg/dL (ref 70–99)
Glucose-Capillary: 100 mg/dL — ABNORMAL HIGH (ref 70–99)

## 2018-10-28 MED ORDER — SODIUM CHLORIDE 0.9 % IV SOLN
200.0000 mg | Freq: Once | INTRAVENOUS | Status: AC
  Administered 2018-10-28: 200 mg via INTRAVENOUS
  Filled 2018-10-28: qty 10

## 2018-10-28 NOTE — Progress Notes (Signed)
   [employer] contacted and transmission obtained from device interrogation on 10-24-2018 at 01:53AM EST for [facility].   Transmission printed with physical copy available in patient chart and to be added to scanned patient records.   Signed, Arvil Chaco, PA-C 10/28/2018, 10:12 AM Pager [phone]

## 2018-10-28 NOTE — Care Management (Signed)
RNCM spoke with patient by phone to see if she had time to look over home health agency list.  She states her daughter [name] has paper and asked that I contact her which I did.  [name] said she would let me know when they decided on an agency.

## 2018-10-28 NOTE — Care Management (Signed)
RNCM spoke with patient's daughter [name]. She has multiple complaints. She states that when patient came to ED, she was placed in front of the ED/EMS entry hall way and wind was blowing on her. She states that patient now has a cough and a wheeze requiring nebulizer treatments that she claims she has never needed before.  She believes her mother is too weak to return to home alone and there are not family members available to help care for her. She states she believes that her pacemaker is not working and that patient is in heart failure. RNCM will meet with patient and daughter again.  RNCM spoke with MD and ABN is appropriate.

## 2018-10-28 NOTE — Progress Notes (Signed)
Progress Note  Patient Name: Catherine [name] Date of Encounter: 10/28/2018  Primary Cardiologist: Sherren Mocha, MD  Subjective   Patient with multiple complaints including continued weakness and unsteadiness.  She also has abdominal pain.  She reports vague chest pain earlier during this admission as well as prior to admission.  She is currently without chest pain or shortness of breath.  She remains very concerned that her pacemaker is not functioning properly, as she was told by Dr. Mable Paris (ED physician) that interrogation in the ED indicated device malfunction.  Inpatient Medications    Scheduled Meds: . aspirin  81 mg Oral Daily  . atorvastatin  40 mg Oral Daily  . budesonide (PULMICORT) nebulizer solution  0.5 mg Nebulization BID  . buPROPion  300 mg Oral Daily  . clonazePAM  1 mg Oral QHS  . docusate sodium  100 mg Oral BID  . enoxaparin (LOVENOX) injection  40 mg Subcutaneous Q24H  . insulin aspart  0-5 Units Subcutaneous QHS  . insulin aspart  0-9 Units Subcutaneous TID WC  . ipratropium-albuterol  3 mL Nebulization BID  . levothyroxine  100 mcg Oral QAC breakfast  . linagliptin  2.5 mg Oral Q breakfast   And  . metFORMIN  500 mg Oral Q breakfast  . mirabegron ER  50 mg Oral Daily  . multivitamin with minerals  1 tablet Oral Q1200  . pantoprazole  40 mg Oral Daily  . PARoxetine  40 mg Oral Daily  . polyethylene glycol  17 g Oral Daily  . potassium chloride  10 mEq Oral Daily  . sodium chloride flush  3 mL Intravenous Q12H  . traZODone  25 mg Oral QHS   Continuous Infusions:  PRN Meds: acetaminophen **OR** acetaminophen, alum & mag hydroxide-simeth, bisacodyl, bisacodyl, ketorolac, ondansetron **OR** ondansetron (ZOFRAN) IV, ondansetron, senna-docusate   Vital Signs    Vitals:   10/27/18 2002 10/27/18 2044 10/28/18 0533 10/28/18 0803  BP: (!) 147/50  (!) 132/54   Pulse: 71  66   Resp: 18  17   Temp: 98.3 F (36.8 C)  98.1 F (36.7 C)   TempSrc: Oral   Oral   SpO2: 100% 97% 95% 94%  Weight:   92 kg   Height:        Intake/Output Summary (Last 24 hours) at 10/28/2018 1545 Last data filed at 10/28/2018 1417 Gross per 24 hour  Intake -  Output 2150 ml  Net -2150 ml   Filed Weights   10/26/18 0234 10/27/18 0403 10/28/18 0533  Weight: 90.9 kg 92.4 kg 92 kg    Telemetry    Sinus rhythm with ventricular pacing - Personally Reviewed  ECG    12/23/2017: Normal sinus rhythm with atrial sensing and ventricular pacing - Personally Reviewed  Physical Exam   GEN: No acute distress.  Appears anxious.  Daughter is at the bedside Neck: No JVD Cardiac: RRR, no murmurs, rubs, or gallops.  Respiratory: Clear to auscultation bilaterally. GI: Soft with mild diffuse tenderness.  No rebound or guarding.  Bowel sounds present. MS: No edema; No deformity. Neuro:  Nonfocal  Psych: Anxious and tearful at times during exam  Labs    Chemistry Recent Labs  Lab 10/23/18 1805 10/24/18 0501 10/27/18 0420 10/28/18 0450  NA 137  --  136 135  K 4.2  --  4.8 4.9  CL 105  --  106 102  CO2 26  --  26 24  GLUCOSE 95  --  108* 137*  BUN 21  --  23 23  CREATININE 1.29*  --  1.54* 1.38*  CALCIUM 9.4  --  9.0 9.0  PROT  --  6.3*  --   --   ALBUMIN  --  3.5  --   --   AST  --  20  --   --   ALT  --  11  --   --   ALKPHOS  --  40  --   --   BILITOT  --  0.6  --   --   GFRNONAA 38*  --  31* 35*  GFRAA 44*  --  36* 41*  ANIONGAP 6  --  4* 9     Hematology Recent Labs  Lab 10/23/18 1805 10/24/18 1247 10/27/18 0420 10/28/18 0450  WBC 8.5  --  6.7 6.9  RBC 3.43* 3.16* 3.19* 3.15*  HGB 9.4*  --  8.7* 8.6*  HCT 31.4*  --  28.6* 28.4*  MCV 91.5  --  89.7 90.2  MCH 27.4  --  27.3 27.3  MCHC 29.9*  --  30.4 30.3  RDW 15.8*  --  16.1* 16.1*  PLT 240  --  213 200    Cardiac Enzymes Recent Labs  Lab 10/23/18 1805 10/24/18 0501 10/24/18 0825  TROPONINI <0.03 <0.03 <0.03   No results for input(s): TROPIPOC in the last 168 hours.    BNP Recent Labs  Lab 10/24/18 1556  BNP 211.0*     DDimer No results for input(s): DDIMER in the last 168 hours.   Radiology    Dg Chest 1 View  Result Date: 10/27/2018 CLINICAL DATA:  Dyspnea EXAM: CHEST  1 VIEW COMPARISON:  10/25/2018 FINDINGS: Cardiac shadow is enlarged. Findings of prior TAVR are again seen. Pacing device is again noted and stable. The lungs are well aerated with the exception of minimal left basilar atelectasis stable from the prior exam. No new bony abnormality is seen. IMPRESSION: Stable left basilar atelectasis. Electronically Signed   By: Inez Catalina M.D.   On: 10/27/2018 17:27    Cardiac Studies   TTE (10/24/2018): - Left ventricle: The cavity size was normal. There was mild   concentric hypertrophy. Systolic function was normal. The   estimated ejection fraction was in the range of 50% to 55%. Wall   motion was normal; there were no regional wall motion   abnormalities. Doppler parameters are consistent with abnormal   left ventricular relaxation (grade 1 diastolic dysfunction). - Aortic valve: A Bioprosethic prosthesis was present. Peak   velocity (S): 143 cm/s. Mean gradient (S): 6 mm Hg. - Left atrium: The atrium was moderately dilated. - Right ventricle: Systolic function was normal. - Right atrium: The atrium was mildly dilated. - Tricuspid valve: There was mild regurgitation. - Pulmonary arteries: Systolic pressure was within the normal   range.  BLE venous duplex (10/24/2018): No evidence of DVT in either lower extremity.  Patient Profile     77 y.o. female with history of medically managed coronary artery disease, aortic stenosis status post TAVR complicated by complete heart block status post AV pacemaker, admitted with lightheadedness, fatigue, hypotension, and reported bradycardia at home.  Assessment & Plan    Generalized fatigue, malaise, and shortness of breath Symptoms persist, though somewhat improved since admission.  No  arrhythmias noted on telemetry.  Device interrogation from admission personally reviewed again.  Other than 2 brief episodes of what appear to be atrial tachycardia lasting less than 1 minute each, no  arrhythmia or evidence of device malfunction was seen.  Echocardiogram during this admission shows preserved LVEF with normal gradient across the TAVR valve.  Continue current medications.  Need to hold lisinopril and metoprolol in the setting of hypotension.  Patient appears euvolemic.  Given orthostatic lightheadedness/hypotension, I would not diurese at this time.  If symptoms continue, consider further review of medications as polypharmacy may be contributing.  In particular, the patient is on multiple psychotropic/sedating medications, including bupropion, clonazepam, fluoxetine, and trazodone.  Complete heart block status post pacemaker No evidence of device malfunction on device interrogation obtained in the ED as well as on telemetry since admission.  Continue device clinic follow-up.  Aortic stenosis status post TAVR Normal prosthesis function on echo during this admission.  No further work-up at this time.  SBE prophylaxis for dental procedures.  Coronary artery disease Vague symptoms are nonspecific.  I do not believe that they represent acute coronary syndrome; troponin negative x3 during this admission.  Echo without wall motion abnormality.  Continue medical therapy, given significant single-vessel CAD noted by catheterization in 2016.  [facility] will sign off.   Medication Recommendations: Continue to hold metoprolol and lisinopril until outpatient follow-up. Other recommendations (labs, testing, etc): None. Follow up as an outpatient: Follow-up with Dr. Burt Knack or APP in approximately 2 weeks.  Continue routine device follow-up through EP/device clinic.  For questions or updates, please contact [place] Please consult [email or web address] for contact info under [facility]  Cardiology.     Signed, Nelva Bush, MD  10/28/2018, 3:45 PM

## 2018-10-28 NOTE — Progress Notes (Signed)
Physical Therapy Treatment Patient Details Name: Catherine [name] MRN: [id number] DOB: [birth year] Today's Date: 10/28/2018    History of Present Illness presented to ER secondary to progressive weakness, reported hypotension and pre-syncopal feelings; admitted for syncope work up.  PPM interrogated, working normally per cardiology.  Vitals running stable since admission.    PT Comments    Gradual progression in gait distance, with noted improvement in comfort/confidence with mobility tasks.  Continue to recommend transition to home with HHPT at discharge.   Follow Up Recommendations  Home health PT     Equipment Recommendations  Rolling walker with 5" wheels    Recommendations for Other Services       Precautions / Restrictions Precautions Precautions: Fall Precaution Comments: O2 via nasal cannula Restrictions Weight Bearing Restrictions: No    Mobility  Bed Mobility Overal bed mobility: Modified Independent                Transfers Overall transfer level: Needs assistance Equipment used: Rolling walker (2 wheeled) Transfers: Sit to/from Stand Sit to Stand: Supervision;Min guard         General transfer comment: cuing for hand placement, tends to pull on RW; improved comfort/confidence with movement transition  Ambulation/Gait Ambulation/Gait assistance: Min guard;Supervision Gait Distance (Feet): 75 Feet Assistive device: Rolling walker (2 wheeled)       General Gait Details: reciprocal stepping pattern with decreased step height/length, decreased balance reactions; mild tremulousness at times, but no overt buckling or LOB.  Broad turning radius, decreased balance reactions.  Min cuing for walker position   Stairs             Wheelchair Mobility    Modified Rankin (Stroke Patients Only)       Balance Overall balance assessment: Needs assistance Sitting-balance support: No upper extremity supported;Feet supported Sitting balance-Leahy  Scale: Good     Standing balance support: Bilateral upper extremity supported Standing balance-Leahy Scale: Fair                              Cognition Arousal/Alertness: Awake/alert Behavior During Therapy: WFL for tasks assessed/performed Overall Cognitive Status: Within Functional Limits for tasks assessed                                 General Comments: mild STM deficits, repeating self multiple times throughout session      Exercises Other Exercises Other Exercises: Toilet transfer, ambulatory with RW, cga/close sup; sit/stand with RW from BSc, cga/close sup; standing balance with RW, cga/close sup.    General Comments        Pertinent Vitals/Pain Pain Assessment: No/denies pain    Home Living                      Prior Function            PT Goals (current goals can now be found in the care plan section) Acute Rehab PT Goals Patient Stated Goal: to walk and get home Time For Goal Achievement: 11/08/18 Potential to Achieve Goals: Good Progress towards PT goals: Progressing toward goals    Frequency    Min 2X/week      PT Plan Current plan remains appropriate    Co-evaluation              AM-PAC PT "6 Clicks" Mobility   Outcome Measure  Help  needed turning from your back to your side while in a flat bed without using bedrails?: None Help needed moving from lying on your back to sitting on the side of a flat bed without using bedrails?: None Help needed moving to and from a bed to a chair (including a wheelchair)?: A Little Help needed standing up from a chair using your arms (e.g., wheelchair or bedside chair)?: A Little Help needed to walk in hospital room?: A Little Help needed climbing 3-5 steps with a railing? : A Little 6 Click Score: 20    End of Session Equipment Utilized During Treatment: Gait belt Activity Tolerance: Patient tolerated treatment well Patient left: in chair;with call bell/phone  within reach;with family/visitor present(RN to apply alarm during next transfer (not avilable in room)) Nurse Communication: Mobility status PT Visit Diagnosis: Unsteadiness on feet (R26.81);Difficulty in walking, not elsewhere classified (R26.2)     Time: 3496-1164 PT Time Calculation (min) (ACUTE ONLY): 30 min  Charges:  $Gait Training: 8-22 mins $Therapeutic Activity: 8-22 mins                     Cristofer Yaffe H. Owens Shark, PT, DPT, NCS 10/28/18, 4:25 PM [phone]

## 2018-10-28 NOTE — Progress Notes (Signed)
Patient ID: Catherine [name], female   DOB: 20-Dec-1940, 77 y.o.   MRN: [id number]  [facility] PROGRESS NOTE  SEDA KRONBERG XLK:[id number] DOB: Oct 03, 1941 DOA: 10/23/2018 PCP: Jilda Panda, MD  HPI/Subjective: States she is worried to go home because she does not want to fall. Having weakness with ambulation. Also endorsing some shortness of breath and cough. No fevers or chills.  Objective: Vitals:   10/28/18 0803 10/28/18 1946  BP:  (!) 153/67  Pulse:  75  Resp:  17  Temp:  97.6 F (36.4 C)  SpO2: 94% 98%    Intake/Output Summary (Last 24 hours) at 10/28/2018 2226 Last data filed at 10/28/2018 2000 Gross per 24 hour  Intake 160 ml  Output 2550 ml  Net -2390 ml   Filed Weights   10/26/18 0234 10/27/18 0403 10/28/18 0533  Weight: 90.9 kg 92.4 kg 92 kg    ROS: Review of Systems  Constitutional: Positive for malaise/fatigue. Negative for chills and fever.  Eyes: Negative for blurred vision.  Respiratory: Positive for cough and shortness of breath. Negative for wheezing.   Cardiovascular: Negative for chest pain and leg swelling.  Gastrointestinal: Negative for abdominal pain, constipation, diarrhea, nausea and vomiting.  Genitourinary: Negative for dysuria.  Musculoskeletal: Negative for joint pain.  Neurological: Positive for dizziness and headaches.   Exam: Physical Exam  Constitutional: She is oriented to person, place, and time.  HENT:  Nose: No mucosal edema.  Mouth/Throat: No oropharyngeal exudate or posterior oropharyngeal edema.  Eyes: Pupils are equal, round, and reactive to light. Conjunctivae, EOM and lids are normal.  Neck: No JVD present. Carotid bruit is not present. No edema present. No thyroid mass and no thyromegaly present.  Cardiovascular: S1 normal and S2 normal. Exam reveals no gallop.  No murmur heard. Pulses:      Dorsalis pedis pulses are 2+ on the right side, and 2+ on the left side.  Respiratory: No respiratory distress. She has no  decreased breath sounds. She has no wheezes. She has no rhonchi. She has no rales.  Diminished breath sounds in the lung bases bilaterally  GI: Soft. Bowel sounds are normal. There is no tenderness.  Lymphadenopathy:    She has no cervical adenopathy.  Neurological: She is alert and oriented to person, place, and time. No cranial nerve deficit.  Skin: Skin is warm. No rash noted. Nails show no clubbing.  Psychiatric: She has a normal mood and affect.      Data Reviewed: Basic Metabolic Panel: Recent Labs  Lab 10/23/18 1805 10/24/18 0501 10/27/18 0420 10/28/18 0450  NA 137  --  136 135  K 4.2  --  4.8 4.9  CL 105  --  106 102  CO2 26  --  26 24  GLUCOSE 95  --  108* 137*  BUN 21  --  23 23  CREATININE 1.29*  --  1.54* 1.38*  CALCIUM 9.4  --  9.0 9.0  MG  --  1.9  --   --   PHOS  --  2.6  --   --    Liver Function Tests: Recent Labs  Lab 10/24/18 0501  AST 20  ALT 11  ALKPHOS 40  BILITOT 0.6  PROT 6.3*  ALBUMIN 3.5   CBC: Recent Labs  Lab 10/23/18 1805 10/27/18 0420 10/28/18 0450  WBC 8.5 6.7 6.9  HGB 9.4* 8.7* 8.6*  HCT 31.4* 28.6* 28.4*  MCV 91.5 89.7 90.2  PLT 240 213 200   Cardiac  Enzymes: Recent Labs  Lab 10/23/18 1805 10/24/18 0501 10/24/18 0825  TROPONINI <0.03 <0.03 <0.03   BNP (last 3 results) Recent Labs    10/24/18 1556  BNP 211.0*     CBG: Recent Labs  Lab 10/28/18 0535 10/28/18 0835 10/28/18 1159 10/28/18 1711 10/28/18 2108  GLUCAP 122* 122* 146* 84 100*      Studies: Dg Chest 1 View  Result Date: 10/27/2018 CLINICAL DATA:  Dyspnea EXAM: CHEST  1 VIEW COMPARISON:  10/25/2018 FINDINGS: Cardiac shadow is enlarged. Findings of prior TAVR are again seen. Pacing device is again noted and stable. The lungs are well aerated with the exception of minimal left basilar atelectasis stable from the prior exam. No new bony abnormality is seen. IMPRESSION: Stable left basilar atelectasis. Electronically Signed   By: Inez Catalina M.D.    On: 10/27/2018 17:27    Scheduled Meds: . aspirin  81 mg Oral Daily  . atorvastatin  40 mg Oral Daily  . budesonide (PULMICORT) nebulizer solution  0.5 mg Nebulization BID  . buPROPion  300 mg Oral Daily  . clonazePAM  1 mg Oral QHS  . docusate sodium  100 mg Oral BID  . enoxaparin (LOVENOX) injection  40 mg Subcutaneous Q24H  . insulin aspart  0-5 Units Subcutaneous QHS  . insulin aspart  0-9 Units Subcutaneous TID WC  . ipratropium-albuterol  3 mL Nebulization BID  . levothyroxine  100 mcg Oral QAC breakfast  . linagliptin  2.5 mg Oral Q breakfast   And  . metFORMIN  500 mg Oral Q breakfast  . mirabegron ER  50 mg Oral Daily  . multivitamin with minerals  1 tablet Oral Q1200  . pantoprazole  40 mg Oral Daily  . PARoxetine  40 mg Oral Daily  . polyethylene glycol  17 g Oral Daily  . potassium chloride  10 mEq Oral Daily  . sodium chloride flush  3 mL Intravenous Q12H  . traZODone  25 mg Oral QHS   Continuous Infusions:  Assessment/Plan:  1. Orthostatic hypotension and presyncope- improved. Able to walk to the bathroom today without any lightheadedness.  Discontinued lisinopril, metoprolol and Flomax. Orthostatics negative today.  2. Shortness of breath/dyspnea on exertion- previous CXR negative. Has not been on antibiotics. Repeat CXR was unremarkable. ECHO with normal EF and G1DD this hospitalization. No signs of volume overload.   3. Iron deficiency anemia- Hgb decreasing. May be dilutional component, as all cell lines have decreased. Patient endorses hematuria, but UA without RBCs. Has received IV iron x 2. 4. Pain behind right knee.  Ultrasound is negative for DVT or Baker's cyst. 5. Weakness.  Plan for home with HHPT. 6. Anxiety- continue psychiatric medications 7. Type 2 diabetes- continue oral medications 8. Hyperlipidemia- continue atorvastatin 9. Urinary incontinence continue home meds but holding flomax 10. Chronic kidney disease stage III- Creatinine at baseline.  Monitor.  Code Status:     Code Status Orders  (From admission, onward)         Start     Ordered   10/24/18 0549  Full code  Continuous     10/24/18 0548        Code Status History    Date Active Date Inactive Code Status Order ID Comments User Context   03/09/2017 2114 03/12/2017 1857 Full Code [id number]  Sid Falcon, MD Inpatient   09/24/2015 1546 09/26/2015 1821 Full Code [id number]  Gladstone Lighter, MD Inpatient   08/31/2015 1341 09/07/2015 1658 Full Code [id number]  Constance Haw, MD Inpatient   08/30/2015 1423 08/31/2015 1341 Full Code [id number]  Sherren Mocha, MD Inpatient   08/11/2015 1424 08/11/2015 2238 Full Code [id number]  Sherren Mocha, MD Inpatient     Family Communication: Husband at bedside. Disposition Plan: Discharge home with home health tomorrow  Consultants:  Cardiology  Time spent: 50 minutes  [place]

## 2018-10-28 NOTE — Care Management Note (Addendum)
Case Management Note  Patient Details  Name: Catherine [name] MRN: [id number] Date of Birth: May 11, 1941  Subjective/Objective:                  RNCM met with patient and daughter [name]. Patient shares that she is afraid to return to home due to weakness. She wants to see a cardiologist first. She wants to know what is wrong with her.  She states she lives at home with her husband and he is able to drive. She states she does not have any problems getting to doctor appointment. She ambulates at baseline with a cane or walker.  Action/Plan: ABN delivered for patient and daughter to review. She declined to sign until [name] is able to review.  Home health list provided for their review along with private duty agency list. Update: RNCM spoke with director of 2a- she will speak with ED director regarding patient's concerns.   Expected Discharge Date:                  Expected Discharge Plan:     In-House Referral:     Discharge planning Services  CM Consult  Post Acute Care Choice:  Home Health Choice offered to:  Patient, Adult Children  DME Arranged:    DME Agency:     HH Arranged:    [place] Agency:     Status of Service:  In process, will continue to follow  If discussed at Long Length of Stay Meetings, dates discussed:    Additional Comments:  Marshell Garfinkel, RN 10/28/2018, 10:15 AM

## 2018-10-29 LAB — GLUCOSE, CAPILLARY
Glucose-Capillary: 112 mg/dL — ABNORMAL HIGH (ref 70–99)
Glucose-Capillary: 118 mg/dL — ABNORMAL HIGH (ref 70–99)
Glucose-Capillary: 109 mg/dL — ABNORMAL HIGH (ref 70–99)

## 2018-10-29 LAB — CBC
HEMATOCRIT: 28.2 % — AB (ref 36.0–46.0)
Hemoglobin: 8.5 g/dL — ABNORMAL LOW (ref 12.0–15.0)
MCH: 27.1 pg (ref 26.0–34.0)
MCHC: 30.1 g/dL (ref 30.0–36.0)
MCV: 89.8 fL (ref 80.0–100.0)
PLATELETS: 227 10*3/uL (ref 150–400)
RBC: 3.14 MIL/uL — ABNORMAL LOW (ref 3.87–5.11)
RDW: 16.3 % — AB (ref 11.5–15.5)
WBC: 6.6 10*3/uL (ref 4.0–10.5)
nRBC: 0 % (ref 0.0–0.2)

## 2018-10-29 LAB — BASIC METABOLIC PANEL
Anion gap: 6 (ref 5–15)
BUN: 21 mg/dL (ref 8–23)
CALCIUM: 9 mg/dL (ref 8.9–10.3)
CO2: 25 mmol/L (ref 22–32)
CREATININE: 1.28 mg/dL — AB (ref 0.44–1.00)
Chloride: 106 mmol/L (ref 98–111)
GFR calc non Af Amer: 40 mL/min — ABNORMAL LOW (ref >60)
GFR, EST AFRICAN AMERICAN: 46 mL/min — AB (ref >60)
Glucose, Bld: 124 mg/dL — ABNORMAL HIGH (ref 70–99)
Potassium: 4.7 mmol/L (ref 3.5–5.1)
Sodium: 137 mmol/L (ref 135–145)

## 2018-10-29 MED ORDER — BUDESONIDE 0.5 MG/2ML IN SUSP: 0.5000 mg | Freq: Two times a day (BID) | RESPIRATORY_TRACT | 0 refills | Status: DC

## 2018-10-29 NOTE — Care Management (Signed)
Patient will pay out of pocket for home nebulizer as insurance will not cover because she does not have a chronic pulmonary condition.  Dr. Brett Albino and Colletta Maryland, RN aware.   [facility] with [facility] will be up to provide.

## 2018-10-29 NOTE — Progress Notes (Signed)
Went over discharge instructions with the patient and family including new medications and follow up appointment. Previous RN stephanie, discontinue peripheral IV and telemetry monitor. Helped patient for transport to discharge.

## 2018-10-29 NOTE — Discharge Instructions (Signed)
It was so nice to meet you during this hospitalization!  Please STOP taking your lisinopril and metoprolol for now. Your primary care doctor may restart this in the future.  I have prescribed some pulmicort breathing treatments for you to use at home. Please use this twice a day.  Take care and Happy Thanksgiving! -Dr. Brett Albino

## 2018-10-29 NOTE — Plan of Care (Signed)
  Problem: Health Behavior/Discharge Planning: Goal: Ability to manage health-related needs will improve Outcome: Progressing   Problem: Clinical Measurements: Goal: Respiratory complications will improve Outcome: Progressing   Problem: Activity: Goal: Risk for activity intolerance will decrease Outcome: Progressing   Problem: Safety: Goal: Ability to remain free from injury will improve Outcome: Progressing   

## 2018-10-29 NOTE — Discharge Summary (Signed)
[facility] [place] NAME: Catherine [name]    MR#:  [id number]  DATE OF BIRTH:  [birth year]  DATE OF ADMISSION:  10/23/2018   ADMITTING PHYSICIAN: Arta Silence, MD  DATE OF DISCHARGE: 10/29/2018  3:53 PM  PRIMARY CARE PHYSICIAN: Jilda Panda, MD   ADMISSION DIAGNOSIS:  Generalized weakness [R53.1] Malfunction of cardiac pacemaker, initial encounter [T82.111A] DISCHARGE DIAGNOSIS:  Active Problems:   Near syncope   Generalized weakness   Shortness of breath  SECONDARY DIAGNOSIS:   Past Medical History:  Diagnosis Date  . Acute colitis   . Anemia   . CAD (coronary artery disease)    a. heavy calcification of the entire LAD & mod eccentric mid LAD stenosis, estimated @ 70%, mild nonobs stenosis of RCA and LCx, severely calcified Ao valve with restricted valve mobility and 2+ AI    . Cancer [facility]) 1981   breast  . CHB (complete heart block) ([place]) 08/31/2015   [place] DR model OI7867 (serial number [id number] )   . Chronic diastolic congestive heart failure ([place])    a. echo 08/31/2015: EF 65-70%, nl WM, GR1DD, Ao valve stent bioprosthesis was present and functioning nl, no regurg, LA mildly dilated, PASP 46 mm Hg  . Collagen vascular disease ([place])   . Colon polyps   . Depression   . Diabetes mellitus type II   . Fibromyalgia   . HTN (hypertension)   . Hypercholesteremia   . Hypothyroidism   . IBS (irritable bowel syndrome)   . Rectal bleeding   . Rheumatoid arthritis(714.0)   . S/P TAVR (transcatheter aortic valve replacement) 08/30/2015   26 mm Edwards Sapien 3 transcatheter heart valve placed via open right transfemoral approach  . Shortness of breath dyspnea   . Stenosis of aortic valve   . Thyroid disease    HOSPITAL COURSE:   Catherine [name] is a 77 year old female who presented to the ED lightheadedness and near syncope. In the ED, her BP was 62/39 and she was admitted for further management.  1. Orthostatic  hypotension and presyncope- improved. Discontinued lisinopril, metoprolol and Flomax. Orthostatics negative after fluids. 2. Shortness of breath/dyspnea on exertion- CXR negative. ECHO with normal EF and G1DD this hospitalization. No signs of volume overload. Consider PFTs as an outpatient.   3. Iron deficiency anemia- may be dilutional component. Ferritin low-normal at 17 this admission. Given IV iron x 2. 4. Pain behind right knee.  Ultrasound is negative for DVT or Baker's cyst. 5. Weakness. Home health services ordered at discharge. 6. Anxiety- continued psychiatric medications 7. Type 2 diabetes- continued oral medications 8. Hyperlipidemia- continued atorvastatin 9. Urinary incontinence- flomax stopped 10. Chronic kidney disease stage III- Creatinine at baseline  DISCHARGE CONDITIONS:  Dyspnea on exertion Iron deficiency anemia Weakness Anxiety Type 2 diabetes Hyperlipidemia Urinary incontinence CKD III CONSULTS OBTAINED:  Treatment Team:  Arta Silence, MD DRUG ALLERGIES:   Allergies  Allergen Reactions  . Sulfa Antibiotics Hives  . Latex Rash   DISCHARGE MEDICATIONS:   Allergies as of 10/29/2018      Reactions   Sulfa Antibiotics Hives   Latex Rash      Medication List    STOP taking these medications   lisinopril 10 MG tablet Commonly known as:  PRINIVIL,ZESTRIL   metoprolol tartrate 25 MG tablet Commonly known as:  LOPRESSOR     TAKE these medications   aspirin EC 81 MG tablet Take 81 mg by mouth  daily.   atorvastatin 40 MG tablet Commonly known as:  LIPITOR Take 1 tablet by mouth daily.   budesonide 0.5 MG/2ML nebulizer solution Commonly known as:  PULMICORT Take 2 mLs (0.5 mg total) by nebulization 2 (two) times daily.   buPROPion 300 MG 24 hr tablet Commonly known as:  WELLBUTRIN XL Take 1 tablet (300 mg total) by mouth daily.   clonazePAM 1 MG tablet Commonly known as:  KLONOPIN TAKE 1 TABLET BY MOUTH AT BEDTIME What changed:  how  much to take   docusate sodium 100 MG capsule Commonly known as:  COLACE Take 100 mg by mouth 2 (two) times daily as needed for mild constipation.   levothyroxine 100 MCG tablet Commonly known as:  SYNTHROID, LEVOTHROID Take 100 mcg by mouth daily before breakfast.   mirabegron ER 50 MG Tb24 tablet Commonly known as:  MYRBETRIQ Take 1 tablet (50 mg total) by mouth daily.   multivitamin with minerals Tabs tablet Take 1 tablet by mouth daily at 12 noon.   ondansetron 4 MG tablet Commonly known as:  ZOFRAN Take 4 mg by mouth as needed for nausea or vomiting.   pantoprazole 40 MG tablet Commonly known as:  PROTONIX Take 1 tablet (40 mg total) by mouth daily.   PARoxetine 40 MG tablet Commonly known as:  PAXIL Take 1 tablet (40 mg total) by mouth daily.   polyethylene glycol packet Commonly known as:  MIRALAX / GLYCOLAX Take 17 g by mouth daily.   TRADJENTA 5 MG Tabs tablet Generic drug:  linagliptin Take 5 mg by mouth daily.   traZODone 50 MG tablet Commonly known as:  DESYREL Take 0.5 tablets (25 mg total) by mouth at bedtime. TAKE 1/2 TABLET AT BED TIME.            Durable Medical Equipment  (From admission, onward)         Start     Ordered   10/29/18 1129  For home use only DME Walker  Once    Comments:  5" rolling walker  Question:  Patient needs a walker to treat with the following condition  Answer:  Generalized weakness   10/29/18 1128   10/29/18 0000  DME Nebulizer machine    Question:  Patient needs a nebulizer to treat with the following condition  Answer:  Shortness of breath   10/29/18 1101           DISCHARGE INSTRUCTIONS:  1. F/u with PCP in 1-2 weeks 2. F/u with cardiology in 2 weeks 3. Recheck CBC as an outpatient 4. Consider referral to GI 5. Consider checking PFTs as an outpatient DIET:  Cardiac diet and Diabetic diet DISCHARGE CONDITION:  Stable ACTIVITY:  Activity as tolerated OXYGEN:  Home Oxygen: No.  Oxygen Delivery:  room air DISCHARGE LOCATION:  home   If you experience worsening of your admission symptoms, develop shortness of breath, life threatening emergency, suicidal or homicidal thoughts you must seek medical attention immediately by calling 911 or calling your MD immediately  if symptoms less severe.  You Must read complete instructions/literature along with all the possible adverse reactions/side effects for all the Medicines you take and that have been prescribed to you. Take any new Medicines after you have completely understood and accpet all the possible adverse reactions/side effects.   Please note  You were cared for by a hospitalist during your hospital stay. If you have any questions about your discharge medications or the care you received while you were  in the hospital after you are discharged, you can call the unit and asked to speak with the hospitalist on call if the hospitalist that took care of you is not available. Once you are discharged, your primary care physician will handle any further medical issues. Please note that NO REFILLS for any discharge medications will be authorized once you are discharged, as it is imperative that you return to your primary care physician (or establish a relationship with a primary care physician if you do not have one) for your aftercare needs so that they can reassess your need for medications and monitor your lab values.    On the day of Discharge:  VITAL SIGNS:  Blood pressure (!) 130/59, pulse 87, temperature 98.6 F (37 C), temperature source Oral, resp. rate 14, height 5\' 3"  (1.6 m), weight 92.1 kg, SpO2 94 %. PHYSICAL EXAMINATION:  GENERAL:  77 y.o.-year-old patient lying in the bed with no acute distress.  EYES: Pupils equal, round, reactive to light and accommodation. No scleral icterus. Extraocular muscles intact.  HEENT: Head atraumatic, normocephalic. Oropharynx and nasopharynx clear.  NECK:  Supple, no jugular venous distention. No  thyroid enlargement, no tenderness.  LUNGS: Normal breath sounds bilaterally, no wheezing, rales,rhonchi or crepitation. No use of accessory muscles of respiration.  CARDIOVASCULAR: S1, S2 normal. No murmurs, rubs, or gallops.  ABDOMEN: Soft, non-tender, non-distended. Bowel sounds present. No organomegaly or mass.  EXTREMITIES: No pedal edema, cyanosis, or clubbing.  NEUROLOGIC: Cranial nerves II through XII are intact. +global weakness, no focal deficits. Sensation intact. Gait not checked.  PSYCHIATRIC: The patient is alert and oriented x 3.  SKIN: No obvious rash, lesion, or ulcer.  DATA REVIEW:   CBC Recent Labs  Lab 10/29/18 0347  WBC 6.6  HGB 8.5*  HCT 28.2*  PLT 227    Chemistries  Recent Labs  Lab 10/24/18 0501  10/29/18 0347  NA  --    < > 137  K  --    < > 4.7  CL  --    < > 106  CO2  --    < > 25  GLUCOSE  --    < > 124*  BUN  --    < > 21  CREATININE  --    < > 1.28*  CALCIUM  --    < > 9.0  MG 1.9  --   --   AST 20  --   --   ALT 11  --   --   ALKPHOS 40  --   --   BILITOT 0.6  --   --    < > = values in this interval not displayed.     Microbiology Results  Results for orders placed or performed in visit on 08/27/18  Microscopic Examination     Status: Abnormal   Collection Time: 08/27/18  2:05 PM  Result Value Ref Range Status   WBC, UA None seen 0 - 5 /hpf Final   RBC, UA None seen 0 - 2 /hpf Final   Epithelial Cells (non renal) >10 (H) 0 - 10 /hpf Final   Casts Present (A) None seen /lpf Final   Cast Type Hyaline casts N/A Final   Mucus, UA Present (A) Not Estab. Final   Bacteria, UA Many (A) None seen/Few Final    RADIOLOGY:  No results found.   Management plans discussed with the patient, family and they are in agreement.  CODE STATUS: Full Code  TOTAL TIME TAKING CARE OF THIS PATIENT: 35 minutes.    Berna Spare Xeng Kucher M.D on 10/29/2018 at 5:21 PM  Between 7am to 6pm - Pager - [phone]  After 6pm go to [email or web address] - Microbiologist  [facility]  Office  [phone]  CC: Primary care physician; Jilda Panda, MD   Note: This dictation was prepared with Dragon dictation along with smaller phrase technology. Any transcriptional errors that result from this process are unintentional.

## 2018-10-29 NOTE — Care Management Note (Signed)
Case Management Note  Patient Details  Name: Catherine [name] MRN: [id number] Date of Birth: February 28, 1941  Subjective/Objective:   Patient is from home alone.  Placed in observation for near syncope.  Orthostatics have been negative.  Discharging today with Daughter.  [facility] notified of need for nebulizer prior to discharge.  After offering choice for home health services they chose [facility].  Referral made for RN, PT, OT, Aide, SW.  Will discharge to daughter [name]'s home [address].                   Action/Plan:   Expected Discharge Date:  10/29/18               Expected Discharge Plan:  [place]  In-House Referral:     Discharge planning Services  CM Consult  Post Acute Care Choice:  Home Health Choice offered to:  Patient, Adult Children  DME Arranged:    DME Agency:     HH Arranged:  RN, OT, Nurse's Aide, Social Work, PT [place]  Status of Service:  Completed, signed off  If discussed at [employer], dates discussed:    Additional Comments:  Elza Rafter, RN 10/29/2018, 11:40 AM

## 2018-11-06 DIAGNOSIS — M1990 Unspecified osteoarthritis, unspecified site: Secondary | ICD-10-CM | POA: Insufficient documentation

## 2018-11-06 DIAGNOSIS — K219 Gastro-esophageal reflux disease without esophagitis: Secondary | ICD-10-CM | POA: Insufficient documentation

## 2018-11-06 DIAGNOSIS — N189 Chronic kidney disease, unspecified: Secondary | ICD-10-CM | POA: Insufficient documentation

## 2018-11-06 DIAGNOSIS — D509 Iron deficiency anemia, unspecified: Secondary | ICD-10-CM | POA: Insufficient documentation

## 2018-11-17 DIAGNOSIS — N200 Calculus of kidney: Secondary | ICD-10-CM | POA: Insufficient documentation

## 2018-11-17 DIAGNOSIS — K5730 Diverticulosis of large intestine without perforation or abscess without bleeding: Secondary | ICD-10-CM | POA: Insufficient documentation

## 2018-11-17 DIAGNOSIS — M2060 Acquired deformities of toe(s), unspecified, unspecified foot: Secondary | ICD-10-CM | POA: Insufficient documentation

## 2018-11-17 DIAGNOSIS — I8393 Asymptomatic varicose veins of bilateral lower extremities: Secondary | ICD-10-CM | POA: Insufficient documentation

## 2018-11-17 DIAGNOSIS — B029 Zoster without complications: Secondary | ICD-10-CM | POA: Insufficient documentation

## 2018-11-17 DIAGNOSIS — I517 Cardiomegaly: Secondary | ICD-10-CM | POA: Insufficient documentation

## 2018-11-17 DIAGNOSIS — IMO0001 Reserved for inherently not codable concepts without codable children: Secondary | ICD-10-CM | POA: Insufficient documentation

## 2018-11-17 DIAGNOSIS — L84 Corns and callosities: Secondary | ICD-10-CM | POA: Insufficient documentation

## 2018-11-17 DIAGNOSIS — I5189 Other ill-defined heart diseases: Secondary | ICD-10-CM | POA: Insufficient documentation

## 2018-11-18 ENCOUNTER — Telehealth: Payer: Self-pay | Admitting: Cardiovascular Disease

## 2018-11-18 NOTE — Telephone Encounter (Signed)
New message ° ° ° ° °

## 2018-11-18 NOTE — Telephone Encounter (Signed)
Spoke with Enis Gash a nurse with [facility] and she reports that the pt was recently I the [facility] for orthostatic hypotension and dizziness and sob.Marland Kitchen  Pt meds were changed and she was taken off of her Lasix and all of her BP meds (lisinopril)..  Her BP has been in the 160/70 range and 170/70 when standing She has 3+ pitting edema in her lower extremities bilaterally..  She does deny SOB only when she fully exerts herself.. she reports that her lungs are clear and not SOB at rest.. she still lays flat at rest..  I offered her an appt to come in and she reports that [place] is a convenient location for her.. I made her an appt with Christell Faith PA at our [place] office for 11/20/18 and she will write down all fer BP meds.. she does not own a scale to record her weights.. Pt advised in the meantime to elevate her legs and to minimize her sodium and to try compression stockings if she has time to go out and purchase them before  her appt.. pt verbalized understanding and agreed.

## 2018-11-18 NOTE — Progress Notes (Signed)
Cardiology Office Note Date:  11/20/2018  Patient ID:  Catherine [name], Catherine [name] 19-Nov-1941, MRN [id number] PCP:  Jilda Panda, MD  Cardiologist:  Dr. Burt Knack, MD Electrophysiologist: Dr. Curt Bears, MD    Chief Complaint: Evaluation of lower extremity swelling and hypertension  History of Present Illness: Catherine [name] is a 77 y.o. female with history of CAD medically managed as below, aortic stenosis s/p TAVR in 05/3892 complicated by complete heart block s/p PPM complicated by hemothorax requiring chest tube who presents for evaluation of hypertension and lower extremity swelling.  [name] underwent cath in 08/2015 showed single vessel CAD with heavy calcification of the entire LAD and a moderate eccentric mid LAD stenosis estimated at 70%. There was mild nonobstructive stenosis of the LCx and RCA. She had a severely calcified aortic valve with restricted mobility and 2+ aortic insufficiency with known severe aortic stenosis by prior noninvasive evaluation. Medical management was advised for her CAD along with treatment of her aortic valve disease. She underwent TAVR in 06/3427 which was complicated by complete heart block requiring PPM. Her PPM implantation was complicated by a hemothorax requiring chest tube placement. She was last seen by Dr. Burt Knack in 01/2018 and was doing well from a cardiac perspective. Echo from 03/2017 showed and EF of 60-65%, no RWMA, Gr1DD, normal functioning aortic valve replacement without stenosis or regurgitation.   She has more recently been dealing with frank hematuria and was found to have a kidney stone along with a renal mass, which has been felt to be benign based off CT urogram in 08/2017.    Patient was admitted to [facility] in 10/2018 with lightheadedness, presyncope, and shortness of breath.  It was noted her home BP cuff was giving her erroneous heart rates prompting her to become anxious.  She continued to note frank hematuria.  Her pacemaker was interrogated in the  ED and the patient and her family were told by the ED physician that her pacemaker was malfunctioning.  Upon multiple cardiology physicians and pacemaker rep reviewing the device and print out it was found out the patient's pacemaker was actually not malfunctioning and was working appropriately.  It was noted the patient had 2 brief episodes of atrial tachycardia earlier in the week though no significant events leading up to her admission.  It remains unclear why she was told this by the ED physician, as the patient has continued to insist that her device is malfunctioning since being told this.  Echo during that admission showed an EF of 50 to 55%, no regional wall motion abnormalities, mild concentric LVH, grade 1 diastolic dysfunction, a bioprosthetic aortic valve was present with a mean gradient of 6 mmHg, moderately dilated left atrium, RV systolic function was normal, mildly dilated right atrium, mild tricuspid regurgitation, PASP normal.  She was seen by cardiology for weakness and malaise which was felt to be secondary to deconditioning and underlying anemia.  Cardiology signed off.  Cardiology was reconsulted later during the admission as the patient insisted her device was malfunctioning.  Repeat interrogation again showed the device was functioning appropriately.  It was recommended to hold lisinopril and metoprolol in the setting of orthostatic hypotension.  Was also considered to have the pharmacy review her medicines as polypharmacy was felt to possibly be contributing to her symptoms, in particular she was on multiple psychotropic/sedating medications.  Patient comes in accompanied by her husband today.  Patient has a home health nurse that comes out to the house and has been  checking her blood pressure since her hospital discharge with readings typically in the 696E to 952W systolic.  In this setting, the patient has noted some increase in her lower extremity swelling.  She was previously  prescribed Lasix 40 mg daily for lower extremity swelling though last filled this prescription in mid 09/2018.  She feels like her energy is starting to improve.  No chest pain, shortness of breath, dizziness, presyncope, or syncope.  She does note some lower extremity swelling without any abdominal distention, orthopnea, PND, or early satiety.  She would like to go back on her Lasix and antihypertensive medications.   Past Medical History:  Diagnosis Date  . Acute colitis   . Anemia   . CAD (coronary artery disease)    a. heavy calcification of the entire LAD & mod eccentric mid LAD stenosis, estimated @ 70%, mild nonobs stenosis of RCA and LCx, severely calcified Ao valve with restricted valve mobility and 2+ AI    . Cancer [facility]) 1981   breast  . CHB (complete heart block) ([place]) 08/31/2015   [place] DR model [id number] (serial number [id number] )   . Chronic diastolic congestive heart failure ([place])    a. echo 08/31/2015: EF 65-70%, nl WM, GR1DD, Ao valve stent bioprosthesis was present and functioning nl, no regurg, LA mildly dilated, PASP 46 mm Hg  . Collagen vascular disease ([place])   . Colon polyps   . Depression   . Diabetes mellitus type II   . Fibromyalgia   . HTN (hypertension)   . Hypercholesteremia   . Hypothyroidism   . IBS (irritable bowel syndrome)   . Rectal bleeding   . Rheumatoid arthritis(714.0)   . S/P TAVR (transcatheter aortic valve replacement) 08/30/2015   26 mm Edwards Sapien 3 transcatheter heart valve placed via open right transfemoral approach  . Shortness of breath dyspnea   . Stenosis of aortic valve   . Thyroid disease     Past Surgical History:  Procedure Laterality Date  . ABDOMINAL SURGERY     Intestinal surgery  . CARDIAC SURGERY    . CESAREAN SECTION     x 2  . EP IMPLANTABLE DEVICE N/A 08/31/2015   Procedure: Pacemaker Implant;  Surgeon: Will Meredith Leeds, MD; [place] (serial number [id number] );  Laterality: Right  . LEFT OOPHORECTOMY    . MASTECTOMY Left   . polyp     . self inflicted chest wound    . [place]   lower back  . TEE WITHOUT CARDIOVERSION N/A 08/30/2015   Procedure: TRANSESOPHAGEAL ECHOCARDIOGRAM (TEE);  Surgeon: Sherren Mocha, MD;  Location: [place];  Service: Open Heart Surgery;  Laterality: N/A;  . TOTAL ABDOMINAL HYSTERECTOMY    . TRANSCATHETER AORTIC VALVE REPLACEMENT, TRANSFEMORAL N/A 08/30/2015   Procedure: TRANSCATHETER AORTIC VALVE REPLACEMENT, TRANSFEMORAL;  Surgeon: Sherren Mocha, MD;  Location: [place];  Service: Open Heart Surgery;  Laterality: N/A;    Current Meds  Medication Sig  . aspirin EC 81 MG tablet Take 81 mg by mouth daily.  Marland Kitchen atorvastatin (LIPITOR) 40 MG tablet Take 1 tablet by mouth daily.  . budesonide (PULMICORT) 0.5 MG/2ML nebulizer solution Take 2 mLs (0.5 mg total) by nebulization 2 (two) times daily.  Marland Kitchen buPROPion (WELLBUTRIN XL) 300 MG 24 hr tablet Take 1 tablet (300 mg total) by mouth daily.  . clonazePAM (KLONOPIN) 1 MG tablet TAKE 1 TABLET BY MOUTH AT BEDTIME (Patient taking differently: Take  0.5-1 mg by mouth at bedtime. )  . levothyroxine (SYNTHROID, LEVOTHROID) 100 MCG tablet Take 100 mcg by mouth daily before breakfast.   . linagliptin (TRADJENTA) 5 MG TABS tablet Take 5 mg by mouth daily.  . mirabegron ER (MYRBETRIQ) 50 MG TB24 tablet Take 1 tablet (50 mg total) by mouth daily.  . pantoprazole (PROTONIX) 40 MG tablet Take 1 tablet (40 mg total) by mouth daily.  Marland Kitchen PARoxetine (PAXIL) 40 MG tablet Take 1 tablet (40 mg total) by mouth daily.  . traZODone (DESYREL) 50 MG tablet Take 0.5 tablets (25 mg total) by mouth at bedtime. TAKE 1/2 TABLET AT BED TIME.    Allergies:   Sulfa antibiotics and Latex   Social History:  The patient  reports that she has never smoked. She has never used smokeless tobacco. She reports that she does not drink alcohol or use drugs.   Family History:  The patient's family history includes  Depression in her sister, sister, and sister; Hypertension in her mother and sister; Lung cancer in her sister; Stroke in her sister.  ROS:   Review of Systems  Constitutional: Positive for malaise/fatigue. Negative for chills, diaphoresis, fever and weight loss.  HENT: Negative for congestion.   Eyes: Negative for discharge and redness.  Respiratory: Negative for cough, hemoptysis, sputum production, shortness of breath and wheezing.   Cardiovascular: Positive for leg swelling. Negative for chest pain, palpitations, orthopnea, claudication and PND.  Gastrointestinal: Negative for abdominal pain, blood in stool, heartburn, melena, nausea and vomiting.  Genitourinary: Negative for hematuria.  Musculoskeletal: Negative for falls and myalgias.  Skin: Negative for rash.  Neurological: Positive for weakness. Negative for dizziness, tingling, tremors, sensory change, speech change, focal weakness and loss of consciousness.  Endo/Heme/Allergies: Does not bruise/bleed easily.  Psychiatric/Behavioral: Negative for substance abuse. The patient is not nervous/anxious.   All other systems reviewed and are negative.    PHYSICAL EXAM:  VS:  BP 118/60 (BP Location: Left Arm, Patient Position: Sitting, Cuff Size: Large)   Pulse 61   Ht 5\' 3"  (1.6 m)   Wt 199 lb (90.3 kg) Comment: Patient didnt weigh  BMI 35.25 kg/m  BMI: Body mass index is 35.25 kg/m.  Physical Exam  Constitutional: She is oriented to person, place, and time. She appears well-developed and well-nourished.  HENT:  Head: Normocephalic and atraumatic.  Eyes: Right eye exhibits no discharge. Left eye exhibits no discharge.  Neck: Normal range of motion. No JVD present.  Cardiovascular: Normal rate, regular rhythm, S1 normal and S2 normal. Exam reveals no distant heart sounds, no friction rub, no midsystolic click and no opening snap.  Murmur heard.  Harsh midsystolic murmur is present with a grade of 1/6 at the upper right sternal  border radiating to the neck. Pulses:      Posterior tibial pulses are 1+ on the right side and 1+ on the left side.  Pulmonary/Chest: Effort normal and breath sounds normal. No respiratory distress. She has no decreased breath sounds. She has no wheezes. She has no rales. She exhibits no tenderness.  Abdominal: Soft. She exhibits no distension. There is no abdominal tenderness.  Musculoskeletal:        General: Edema present.     Comments: 1+ nonpitting bilateral lower extremity pedal edema  Neurological: She is alert and oriented to person, place, and time.  Skin: Skin is warm and dry. No cyanosis. Nails show no clubbing.  Psychiatric: She has a normal mood and affect. Her speech is normal  and behavior is normal. Judgment and thought content normal.     EKG:  Was ordered and interpreted by me today. Shows paced rhythm, 62 bpm  Recent Labs: 10/24/2018: ALT 11; B Natriuretic Peptide 211.0; Magnesium 1.9 10/29/2018: BUN 21; Creatinine, Ser 1.28; Hemoglobin 8.5; Platelets 227; Potassium 4.7; Sodium 137  No results found for requested labs within last 8760 hours.   CrCl cannot be calculated (Patient's most recent lab result is older than the maximum 21 days allowed.).   Wt Readings from Last 3 Encounters:  11/20/18 199 lb (90.3 kg)  10/29/18 203 lb (92.1 kg)  08/27/18 206 lb 9.6 oz (93.7 kg)     Other studies reviewed: Additional studies/records reviewed today include: summarized above  ASSESSMENT AND PLAN:  1. CAD involving the native coronary arteries without angina: No symptoms concerning for angina.  Continue current medical therapy with aspirin, Lipitor, and Lopressor.  No plans for ischemic evaluation at this time.  2. Aortic stenosis status post TAVR: Most recent echo demonstrates well-functioning valve.  Follow-up with primary cardiologist as directed.  No further work-up needed at this time.  SBE prophylaxis for all dental procedures.  3. Complete heart block status post  PPM: Pacemaker appears to be functioning normally on twelve-lead EKG today.  Follow-up with EP as directed.  4. Diastolic dysfunction/lower extremity swelling: She does not appear grossly volume overloaded at this time as her weight is down 4 pounds from her hospital discharge.  I suspect some of her lower extremity swelling is in the setting of chronic venous insufficiency.  I recommend she elevate her legs when sitting and she should consider wearing compression stockings.  She has been advised to take Lasix 20 mg daily.  We are checking a BMP today and again in 1 week.  5. Hypertension: Blood pressure at home with home health nurse who provided readings shows poorly controlled blood pressure in the 992E systolic at times.  In this setting, we have agreed to resume Lopressor 12.5 mg twice daily.  She will need close monitoring of her blood pressures.  6. Hyperlipidemia: Remains on atorvastatin 40 mg daily.  Most recent LDL of 114 from 03/2017.  Recommend fasting lipid panel in follow-up.  7. Polypharmacy/medication management: I had a long discussion with the patient and her husband today in an effort to sort out what medications she is actually taking.  We were ultimately able to determine what medications she is currently taking and placed those in her plastic container for her to continue and The ones that she is not taking separated.  Disposition: F/u with Dr. Dr. Burt Knack or APP in 02/2019 as previously directed.  Current medicines are reviewed at length with the patient today.  The patient did not have any concerns regarding medicines.  Signed, Christell Faith, PA-C 11/20/2018 2:30 PM     [facility] [address], [place] [address] [phone]

## 2018-11-20 ENCOUNTER — Ambulatory Visit: Payer: Medicare Other | Admitting: Physician Assistant

## 2018-11-20 ENCOUNTER — Encounter: Payer: Self-pay | Admitting: Physician Assistant

## 2018-11-20 VITALS — BP 118/60 | HR 61 | Ht 63.0 in | Wt 199.0 lb

## 2018-11-20 DIAGNOSIS — I359 Nonrheumatic aortic valve disorder, unspecified: Secondary | ICD-10-CM | POA: Diagnosis not present

## 2018-11-20 DIAGNOSIS — I10 Essential (primary) hypertension: Secondary | ICD-10-CM | POA: Diagnosis not present

## 2018-11-20 DIAGNOSIS — Z953 Presence of xenogenic heart valve: Secondary | ICD-10-CM

## 2018-11-20 DIAGNOSIS — E782 Mixed hyperlipidemia: Secondary | ICD-10-CM

## 2018-11-20 DIAGNOSIS — I442 Atrioventricular block, complete: Secondary | ICD-10-CM

## 2018-11-20 DIAGNOSIS — I2510 Atherosclerotic heart disease of native coronary artery without angina pectoris: Secondary | ICD-10-CM

## 2018-11-20 DIAGNOSIS — Z79899 Other long term (current) drug therapy: Secondary | ICD-10-CM

## 2018-11-20 MED ORDER — FUROSEMIDE 20 MG PO TABS: 20.0000 mg | ORAL_TABLET | Freq: Every day | ORAL | 3 refills | Status: DC

## 2018-11-20 MED ORDER — METOPROLOL TARTRATE 25 MG PO TABS: 12.5000 mg | ORAL_TABLET | Freq: Two times a day (BID) | ORAL | 3 refills | Status: DC

## 2018-11-20 NOTE — Patient Instructions (Signed)
Medication Instructions:  Your physician has recommended you make the following change in your medication:  1- START Lasix 1 tablet (20 mg) once daily  2- DECREASE Metoprolol (Lopressor) 0.5 tablet (12.5 mg) twice daily.  If you need a refill on your cardiac medications before your next appointment, please call your pharmacy.   Lab work: 1- today [facility]) 2- 1 week (BMET) in medical mall  If you have labs (blood work) drawn today and your tests are completely normal, you will receive your results only by: Marland Kitchen MyChart Message (if you have MyChart) OR . A paper copy in the mail If you have any lab test that is abnormal or we need to change your treatment, we will call you to review the results.  Testing/Procedures: None ordered   Follow-Up: At [facility], you and your health needs are our priority.  As part of our continuing mission to provide you with exceptional heart care, we have created designated Provider Care Teams.  These Care Teams include your primary Cardiologist (physician) and Advanced Practice Providers (APPs -  Physician Assistants and Nurse Practitioners) who all work together to provide you with the care you need, when you need it. You will need a follow up appointment in 3 months.  You may see Dr. Rockey Situ or one of the following Advanced Practice Providers on your designated Care Team:   Murray Hodgkins, NP Christell Faith, PA-C . Marrianne Mood, PA-C

## 2018-11-21 ENCOUNTER — Other Ambulatory Visit (HOSPITAL_COMMUNITY): Payer: Self-pay

## 2018-11-21 ENCOUNTER — Ambulatory Visit: Payer: Self-pay | Admitting: Urology

## 2018-11-21 ENCOUNTER — Telehealth: Payer: Self-pay

## 2018-11-21 DIAGNOSIS — F331 Major depressive disorder, recurrent, moderate: Secondary | ICD-10-CM

## 2018-11-21 LAB — BASIC METABOLIC PANEL
BUN/Creatinine Ratio: 19 (ref 12–28)
BUN: 24 mg/dL (ref 8–27)
CO2: 22 mmol/L (ref 20–29)
CREATININE: 1.27 mg/dL — AB (ref 0.57–1.00)
Calcium: 9.8 mg/dL (ref 8.7–10.3)
Chloride: 99 mmol/L (ref 96–106)
GFR, EST AFRICAN AMERICAN: 46 mL/min/{1.73_m2} — AB (ref >59)
GFR, EST NON AFRICAN AMERICAN: 40 mL/min/{1.73_m2} — AB (ref >59)
Glucose: 162 mg/dL — ABNORMAL HIGH (ref 65–99)
Potassium: 4.4 mmol/L (ref 3.5–5.2)
SODIUM: 135 mmol/L (ref 134–144)

## 2018-11-21 MED ORDER — TRAZODONE HCL 50 MG PO TABS: 25.0000 mg | ORAL_TABLET | Freq: Every day | ORAL | 0 refills | Status: DC

## 2018-11-21 MED ORDER — CLONAZEPAM 1 MG PO TABS: 0.5000 mg | ORAL_TABLET | Freq: Every day | ORAL | 0 refills | Status: DC

## 2018-11-21 NOTE — Telephone Encounter (Signed)
Attempted to call patient. LMTCB 12/20

## 2018-11-21 NOTE — Telephone Encounter (Signed)
-----   Message from Rise Mu, PA-C sent at 11/21/2018  6:56 AM EST ----- Renal function is stable and at her ~ baseline.  Potassium at goal.  Glucose is elevated. She needs to follow up with her PCP regarding this.

## 2018-11-24 ENCOUNTER — Telehealth: Payer: Self-pay | Admitting: Cardiovascular Disease

## 2018-11-24 ENCOUNTER — Other Ambulatory Visit: Payer: Self-pay

## 2018-11-24 MED ORDER — FUROSEMIDE 20 MG PO TABS: 20.0000 mg | ORAL_TABLET | Freq: Every day | ORAL | 0 refills | Status: DC

## 2018-11-24 MED ORDER — METOPROLOL TARTRATE 25 MG PO TABS: 12.5000 mg | ORAL_TABLET | Freq: Two times a day (BID) | ORAL | 0 refills | Status: DC

## 2018-11-24 NOTE — Telephone Encounter (Signed)
Home Health nurse calling   *STAT* If patient is at the pharmacy, call can be transferred to refill team.   1. Which medications need to be refilled? (please list name of each medication and dose if known)  Furosemide (LASIX) 20 MG - 1 tablet daily  Metoprolol tartrate (LOPRESSOR) 25 MG - 0.5 tablet 2 times daily   2. Which pharmacy/location (including street and city if local pharmacy) is medication to be sent to? [facility] [address]  3. Do they need a 30 day or 90 day supply? 30 day   Home health nurse calling and states that patient is out of medication and will need some medication called into local pharmacy.

## 2018-11-24 NOTE — Telephone Encounter (Signed)
Results called to pt. Pt verbalized understanding of results and to follow with PCP regarding glucose.

## 2018-11-24 NOTE — Telephone Encounter (Signed)
Refills sent

## 2018-11-25 ENCOUNTER — Ambulatory Visit: Payer: Self-pay | Admitting: Cardiology

## 2018-12-05 ENCOUNTER — Telehealth: Payer: Self-pay

## 2018-12-05 NOTE — Telephone Encounter (Signed)
Call to patient to remind her of lab work that was due to be taken last week. She agrees to go next week to medical mall for redraw.  Advised pt to call for any further questions or concerns

## 2018-12-11 ENCOUNTER — Other Ambulatory Visit
Admission: RE | Admit: 2018-12-11 | Discharge: 2018-12-11 | Disposition: A | Discharge status: Home / Self Care | Payer: Medicare Other | Source: Ambulatory Visit | Attending: Physician Assistant | Admitting: Physician Assistant

## 2018-12-11 ENCOUNTER — Telehealth: Payer: Self-pay | Admitting: Physician Assistant

## 2018-12-11 DIAGNOSIS — I442 Atrioventricular block, complete: Secondary | ICD-10-CM | POA: Insufficient documentation

## 2018-12-11 DIAGNOSIS — I10 Essential (primary) hypertension: Secondary | ICD-10-CM | POA: Diagnosis present

## 2018-12-11 LAB — BASIC METABOLIC PANEL
ANION GAP: 8 (ref 5–15)
BUN: 24 mg/dL — ABNORMAL HIGH (ref 8–23)
CALCIUM: 9.2 mg/dL (ref 8.9–10.3)
CO2: 23 mmol/L (ref 22–32)
Chloride: 102 mmol/L (ref 98–111)
Creatinine, Ser: 0.95 mg/dL (ref 0.44–1.00)
GFR, EST NON AFRICAN AMERICAN: 57 mL/min — AB (ref >60)
Glucose, Bld: 178 mg/dL — ABNORMAL HIGH (ref 70–99)
Potassium: 4.4 mmol/L (ref 3.5–5.1)
SODIUM: 133 mmol/L — AB (ref 135–145)

## 2018-12-11 NOTE — Telephone Encounter (Signed)
I left a message for the patient to call regarding her BMP results- message left on cell #. Attempted to call her home #- voice mail box is full.

## 2018-12-12 ENCOUNTER — Telehealth (HOSPITAL_COMMUNITY): Payer: Self-pay

## 2018-12-12 DIAGNOSIS — F331 Major depressive disorder, recurrent, moderate: Secondary | ICD-10-CM

## 2018-12-12 MED ORDER — TRAZODONE HCL 50 MG PO TABS: 25.0000 mg | ORAL_TABLET | Freq: Every day | ORAL | 0 refills | Status: DC

## 2018-12-12 NOTE — Telephone Encounter (Signed)
Medication refill request - Telephone call from pt requesting a refill of her prescribed Trazodone, last ordered 11/21/18 with no refills. Pt was rescheduled from 12/17/18 to 01/01/19 due to Dr. Adele Schilder out that day and will run out before new appt time. Requests a one time refill be sent in until she sees provider now on 01/01/19.

## 2018-12-12 NOTE — Telephone Encounter (Signed)
Medication management - Telephone call with patient to inform Dr. Adele Schilder had sent in a new Trazodone order as she requested to her [facility] [place].  Patient to call back if any problems filling order.

## 2018-12-12 NOTE — Telephone Encounter (Signed)
I spoke with the patient's husband regarding her lab results (ok per DPR). He voices understanding.

## 2018-12-12 NOTE — Telephone Encounter (Signed)
Prescription called in.  Please asked patient to pick up the medication.

## 2018-12-16 LAB — CUP PACEART REMOTE DEVICE CHECK
Brady Statistic AP VP Percent: 2.6 %
Brady Statistic AP VS Percent: 6.4 %
Brady Statistic AS VP Percent: 15 %
Brady Statistic RA Percent Paced: 8.8 %
Implantable Lead Implant Date: 20160928
Implantable Lead Implant Date: 20160928
Implantable Lead Location: 753859
Implantable Lead Location: 753860
Lead Channel Impedance Value: 440 Ohm
Lead Channel Pacing Threshold Pulse Width: 0.4 ms
Lead Channel Sensing Intrinsic Amplitude: 10.2 mV
Lead Channel Setting Pacing Amplitude: 2 V
Lead Channel Setting Pacing Amplitude: 2.5 V
MDC IDC MSMT BATTERY REMAINING LONGEVITY: 124 mo
MDC IDC MSMT BATTERY REMAINING PERCENTAGE: 95.5 %
MDC IDC MSMT BATTERY VOLTAGE: 2.99 V
MDC IDC MSMT LEADCHNL RA IMPEDANCE VALUE: 390 Ohm
MDC IDC MSMT LEADCHNL RA PACING THRESHOLD AMPLITUDE: 0.75 V
MDC IDC MSMT LEADCHNL RA SENSING INTR AMPL: 4 mV
MDC IDC MSMT LEADCHNL RV PACING THRESHOLD AMPLITUDE: 0.75 V
MDC IDC MSMT LEADCHNL RV PACING THRESHOLD PULSEWIDTH: 0.4 ms
MDC IDC PG IMPLANT DT: 20160928
MDC IDC PG SERIAL: 7779295
MDC IDC SESS DTM: 20191114173100
MDC IDC SET LEADCHNL RV PACING PULSEWIDTH: 0.4 ms
MDC IDC SET LEADCHNL RV SENSING SENSITIVITY: 2.5 mV
MDC IDC STAT BRADY AS VS PERCENT: 76 %
MDC IDC STAT BRADY RV PERCENT PACED: 17 %

## 2018-12-17 ENCOUNTER — Ambulatory Visit (HOSPITAL_COMMUNITY): Payer: Self-pay | Admitting: Psychiatry

## 2019-01-01 ENCOUNTER — Ambulatory Visit (HOSPITAL_COMMUNITY): Payer: Self-pay | Admitting: Psychiatry

## 2019-01-01 ENCOUNTER — Other Ambulatory Visit (HOSPITAL_COMMUNITY): Payer: Self-pay | Admitting: Psychiatry

## 2019-01-01 DIAGNOSIS — F331 Major depressive disorder, recurrent, moderate: Secondary | ICD-10-CM

## 2019-01-15 ENCOUNTER — Ambulatory Visit (INDEPENDENT_AMBULATORY_CARE_PROVIDER_SITE_OTHER): Payer: Medicare Other

## 2019-01-15 DIAGNOSIS — I442 Atrioventricular block, complete: Secondary | ICD-10-CM

## 2019-01-16 LAB — CUP PACEART REMOTE DEVICE CHECK
Battery Voltage: 2.99 V
Brady Statistic RA Percent Paced: 8.2 %
Implantable Lead Implant Date: 20160928
Implantable Lead Implant Date: 20160928
Implantable Lead Location: 753859
Implantable Lead Location: 753860
Implantable Pulse Generator Implant Date: 20160928
Lead Channel Impedance Value: 510 Ohm
Lead Channel Pacing Threshold Amplitude: 0.75 V
Lead Channel Pacing Threshold Pulse Width: 0.4 ms
Lead Channel Sensing Intrinsic Amplitude: 12 mV
Lead Channel Setting Pacing Amplitude: 2 V
Lead Channel Setting Pacing Amplitude: 2.5 V
Lead Channel Setting Sensing Sensitivity: 2.5 mV
MDC IDC MSMT BATTERY REMAINING LONGEVITY: 124 mo
MDC IDC MSMT BATTERY REMAINING PERCENTAGE: 95.5 %
MDC IDC MSMT LEADCHNL RA IMPEDANCE VALUE: 400 Ohm
MDC IDC MSMT LEADCHNL RA PACING THRESHOLD PULSEWIDTH: 0.4 ms
MDC IDC MSMT LEADCHNL RA SENSING INTR AMPL: 3.7 mV
MDC IDC MSMT LEADCHNL RV PACING THRESHOLD AMPLITUDE: 0.75 V
MDC IDC PG SERIAL: 7779295
MDC IDC SESS DTM: 20200213105916
MDC IDC SET LEADCHNL RV PACING PULSEWIDTH: 0.4 ms
MDC IDC STAT BRADY AP VP PERCENT: 2.5 %
MDC IDC STAT BRADY AP VS PERCENT: 5.8 %
MDC IDC STAT BRADY AS VP PERCENT: 22 %
MDC IDC STAT BRADY AS VS PERCENT: 69 %
MDC IDC STAT BRADY RV PERCENT PACED: 25 %

## 2019-01-23 ENCOUNTER — Other Ambulatory Visit (HOSPITAL_COMMUNITY): Payer: Self-pay

## 2019-01-23 ENCOUNTER — Other Ambulatory Visit (HOSPITAL_COMMUNITY): Payer: Self-pay | Admitting: Psychiatry

## 2019-01-23 DIAGNOSIS — F331 Major depressive disorder, recurrent, moderate: Secondary | ICD-10-CM

## 2019-01-23 DIAGNOSIS — F411 Generalized anxiety disorder: Secondary | ICD-10-CM

## 2019-01-23 MED ORDER — PAROXETINE HCL 40 MG PO TABS: 40.0000 mg | ORAL_TABLET | Freq: Every day | ORAL | 0 refills | Status: DC

## 2019-01-23 MED ORDER — BUPROPION HCL ER (XL) 300 MG PO TB24: 300.0000 mg | ORAL_TABLET | Freq: Every day | ORAL | 0 refills | Status: DC

## 2019-01-23 MED ORDER — CLONAZEPAM 1 MG PO TABS: 0.5000 mg | ORAL_TABLET | Freq: Every day | ORAL | 0 refills | Status: DC

## 2019-01-23 MED ORDER — TRAZODONE HCL 50 MG PO TABS: 25.0000 mg | ORAL_TABLET | Freq: Every day | ORAL | 0 refills | Status: DC

## 2019-01-27 NOTE — Progress Notes (Signed)
Remote pacemaker transmission.   

## 2019-01-29 ENCOUNTER — Telehealth: Payer: Self-pay | Admitting: Cardiovascular Disease

## 2019-01-29 NOTE — Telephone Encounter (Signed)
Pt c/o Shortness Of Breath: STAT if SOB developed within the last 24 hours or pt is noticeably SOB on the phone  1. Are you currently SOB (can you hear that pt is SOB on the phone)? yes  2. How long have you been experiencing SOB? For a couple weeks, pt has a breathing machine  3. Are you SOB when sitting or when up moving around? Up moving around  4. Are you currently experiencing any other symptoms? Pt has a cough, also sweating

## 2019-01-29 NOTE — Telephone Encounter (Signed)
Spoke with patient and she states that she has been having coughing frequently due to illness. She did have check up at PCP office yesterday and wanted to get her appointment here as well. She is scheduled to come in on 02/23/19 at 3:20 PM and confirmed this appointment with her at this time. Instructed her to call us back if her symptoms should persist or worsen and we would see her at upcoming appointment. She verbalized understanding, agreeable with plan, and had no further questions at this time.

## 2019-02-02 ENCOUNTER — Other Ambulatory Visit: Payer: Self-pay

## 2019-02-03 MED ORDER — PANTOPRAZOLE SODIUM 40 MG PO TBEC: 40.0000 mg | DELAYED_RELEASE_TABLET | Freq: Every day | ORAL | 0 refills | Status: DC

## 2019-02-14 ENCOUNTER — Other Ambulatory Visit (HOSPITAL_COMMUNITY): Payer: Self-pay | Admitting: Psychiatry

## 2019-02-14 DIAGNOSIS — F331 Major depressive disorder, recurrent, moderate: Secondary | ICD-10-CM

## 2019-02-17 ENCOUNTER — Other Ambulatory Visit (HOSPITAL_COMMUNITY): Payer: Self-pay | Admitting: Psychiatry

## 2019-02-17 DIAGNOSIS — F331 Major depressive disorder, recurrent, moderate: Secondary | ICD-10-CM

## 2019-02-18 ENCOUNTER — Other Ambulatory Visit (HOSPITAL_COMMUNITY): Payer: Self-pay

## 2019-02-18 DIAGNOSIS — F331 Major depressive disorder, recurrent, moderate: Secondary | ICD-10-CM

## 2019-02-18 MED ORDER — CLONAZEPAM 1 MG PO TABS: 0.5000 mg | ORAL_TABLET | Freq: Every day | ORAL | 0 refills | Status: DC

## 2019-02-18 MED ORDER — TRAZODONE HCL 50 MG PO TABS: 25.0000 mg | ORAL_TABLET | Freq: Every day | ORAL | 0 refills | Status: DC

## 2019-02-19 ENCOUNTER — Telehealth: Payer: Self-pay

## 2019-02-20 NOTE — Telephone Encounter (Signed)
Cardiac Questionnaire:  Since your last visit or hospitalization:  1. Have you been having chest pain? no  2. Have you been having shortness of breath? No increase, at baseline  3. Have you been having increasing edema, wt gain, or increase in abdominal girth (pants fitting more tightly)? no  4. Have you had any passing out spells? No  ?  COVID-19 Pre-Screening Questions:  . Have you been in contact with someone that was recently sick with fever/cough or confirmed to have the [place] virus? no  *Contact with a confirmed case should stay at home, away from confirmed patient, monitor symptoms, and reach out to PCP for e-visit/additional testing.  2. Do you have any of the following symptoms [cough, fever (100.4 or greater)], and/or shortness of breath)? No  ?  Pt did not need refills at this time.  ?  Will cancel appt and route to COVID cancel pool.

## 2019-02-20 NOTE — Telephone Encounter (Signed)
Attempted to reach patient, LMOM.

## 2019-02-22 ENCOUNTER — Other Ambulatory Visit: Payer: Self-pay | Admitting: Cardiovascular Disease

## 2019-02-23 ENCOUNTER — Ambulatory Visit: Payer: Self-pay | Admitting: Cardiovascular Disease

## 2019-02-24 ENCOUNTER — Telehealth: Payer: Self-pay | Admitting: Urology

## 2019-02-24 NOTE — Telephone Encounter (Signed)
Spoke to patient and she has noticed blood in her urine the last few days not every time she urinates. She feels she still needs to be seen. I explained the risks and she wants you to tell her what she should do.

## 2019-02-24 NOTE — Telephone Encounter (Signed)
We could schedule her for a virtual visit.

## 2019-02-24 NOTE — Telephone Encounter (Signed)
lmov to schedule Evisit  °

## 2019-02-24 NOTE — Telephone Encounter (Signed)
LMOM for patient to call and change her appointment to a virtual call.

## 2019-02-24 NOTE — Telephone Encounter (Signed)
[facility] is requesting an appointment for hematuria for Catherine [name].  I do not have any referral notes in the chart.  We have seen her and worked her up for hematuria in 08/2018.  If she is not having gross hematuria or pain, she may be rescheduled.

## 2019-02-27 NOTE — Telephone Encounter (Signed)
Scheduled 3/30 Dr. Rockey Situ at 9 am

## 2019-02-27 NOTE — Telephone Encounter (Signed)
Virtual Visit Pre-Appointment Phone Call  Steps For Call:  1. Confirm consent - "In the setting of the current Covid19 crisis, you are scheduled for a (phone or video) visit with your provider on (date) at (time).  Just as we do with many in-office visits, in order for you to participate in this visit, we must obtain consent.  If you'd like, I can send this to your mychart (if signed up) or email for you to review.  Otherwise, I can obtain your verbal consent now.  All virtual visits are billed to your insurance company just like a normal visit would be.  By agreeing to a virtual visit, we'd like you to understand that the technology does not allow for your provider to perform an examination, and thus may limit your provider's ability to fully assess your condition.  Finally, though the technology is pretty good, we cannot assure that it will always work on either your or our end, and in the setting of a video visit, we may have to convert it to a phone-only visit.  In either situation, we cannot ensure that we have a secure connection.  Are you willing to proceed?"  2. Give patient instructions for WebEx download to smartphone as below if video visit  3. Advise patient to be prepared with any vital sign or heart rhythm information, their current medicines, and a piece of paper and pen handy for any instructions they may receive the day of their visit  4. Inform patient they will receive a phone call 15 minutes prior to their appointment time (may be from unknown caller ID) so they should be prepared to answer  5. Confirm that appointment type is correct in Epic appointment notes (video vs telephone)    TELEPHONE CALL NOTE  Catherine [name] has been deemed a candidate for a follow-up tele-health visit to limit community exposure during the Covid-19 pandemic. I spoke with the patient via phone to ensure availability of phone/video source, confirm preferred email & phone number, and discuss  instructions and expectations.  I reminded Catherine [name] to be prepared with any vital sign and/or heart rhythm information that could potentially be obtained via home monitoring, at the time of her visit. I reminded Catherine [name] to expect a phone call at the time of her visit if her visit.  Did the patient verbally acknowledge consent to treatment? yes  Catherine [name] 02/27/2019 1:22 PM   DOWNLOADING THE [place], go to [employer] and type in WebEx in the search bar. [place] [employer], the blue/green circle. The app is free but as with any other app downloads, their phone may require them to verify saved payment information or Apple password. The patient does NOT have to create an account.  - If Android, ask patient to go to [employer] and type in WebEx in the search bar. [place] [employer], the blue/green circle. The app is free but as with any other app downloads, their phone may require them to verify saved payment information or Android password. The patient does NOT have to create an account.   CONSENT FOR TELE-HEALTH VISIT - PLEASE REVIEW  I hereby voluntarily request, consent and authorize [facility] and its employed or contracted physicians, physician assistants, nurse practitioners or other licensed health care professionals (the Practitioner), to provide me with telemedicine health care services (the Services") as deemed necessary by the treating Practitioner. I  acknowledge and consent to receive the Services by the Practitioner via telemedicine. I understand that the telemedicine visit will involve communicating with the Practitioner through live audiovisual communication technology and the disclosure of certain medical information by electronic transmission. I acknowledge that I have been given the opportunity to request an in-person assessment or other available alternative prior to the telemedicine visit and am  voluntarily participating in the telemedicine visit.  I understand that I have the right to withhold or withdraw my consent to the use of telemedicine in the course of my care at any time, without affecting my right to future care or treatment, and that the Practitioner or I may terminate the telemedicine visit at any time. I understand that I have the right to inspect all information obtained and/or recorded in the course of the telemedicine visit and may receive copies of available information for a reasonable fee.  I understand that some of the potential risks of receiving the Services via telemedicine include:   Delay or interruption in medical evaluation due to technological equipment failure or disruption;  Information transmitted may not be sufficient (e.g. poor resolution of images) to allow for appropriate medical decision making by the Practitioner; and/or   In rare instances, security protocols could fail, causing a breach of personal health information.  Furthermore, I acknowledge that it is my responsibility to provide information about my medical history, conditions and care that is complete and accurate to the best of my ability. I acknowledge that Practitioner's advice, recommendations, and/or decision may be based on factors not within their control, such as incomplete or inaccurate data provided by me or distortions of diagnostic images or specimens that may result from electronic transmissions. I understand that the practice of medicine is not an exact science and that Practitioner makes no warranties or guarantees regarding treatment outcomes. I acknowledge that I will receive a copy of this consent concurrently upon execution via email to the email address I last provided but may also request a printed copy by calling the office of [facility].    I understand that my insurance will be billed for this visit.   I have read or had this consent read to me.  I understand the  contents of this consent, which adequately explains the benefits and risks of the Services being provided via telemedicine.   I have been provided ample opportunity to ask questions regarding this consent and the Services and have had my questions answered to my satisfaction.  I give my informed consent for the services to be provided through the use of telemedicine in my medical care  By participating in this telemedicine visit I agree to the above.

## 2019-03-02 ENCOUNTER — Telehealth: Payer: Self-pay

## 2019-03-02 ENCOUNTER — Other Ambulatory Visit: Payer: Self-pay

## 2019-03-02 ENCOUNTER — Telehealth (INDEPENDENT_AMBULATORY_CARE_PROVIDER_SITE_OTHER): Payer: Medicare Other | Admitting: Cardiovascular Disease

## 2019-03-02 VITALS — BP 178/90 | HR 68 | Wt 199.0 lb

## 2019-03-02 DIAGNOSIS — I5032 Chronic diastolic (congestive) heart failure: Secondary | ICD-10-CM | POA: Diagnosis not present

## 2019-03-02 DIAGNOSIS — E785 Hyperlipidemia, unspecified: Secondary | ICD-10-CM

## 2019-03-02 DIAGNOSIS — I442 Atrioventricular block, complete: Secondary | ICD-10-CM | POA: Diagnosis not present

## 2019-03-02 DIAGNOSIS — I350 Nonrheumatic aortic (valve) stenosis: Secondary | ICD-10-CM

## 2019-03-02 DIAGNOSIS — I10 Essential (primary) hypertension: Secondary | ICD-10-CM

## 2019-03-02 MED ORDER — NITROGLYCERIN 0.4 MG SL SUBL: 0.4000 mg | SUBLINGUAL_TABLET | SUBLINGUAL | 2 refills | Status: DC | PRN

## 2019-03-02 MED ORDER — ATORVASTATIN CALCIUM 40 MG PO TABS: 40.0000 mg | ORAL_TABLET | Freq: Every day | ORAL | 3 refills | Status: DC

## 2019-03-02 NOTE — Progress Notes (Signed)
Can you please schedule?

## 2019-03-02 NOTE — Progress Notes (Addendum)
Virtual Visit via Telephone Note   This visit type was conducted due to national recommendations for restrictions regarding the COVID-19 Pandemic (e.g. social distancing) in an effort to limit this patient's exposure and mitigate transmission in our community.  Due to her co-morbid illnesses, this patient is at least at moderate risk for complications without adequate follow up.  This format is felt to be most appropriate for this patient at this time.  The patient did not have access to video technology/had technical difficulties with video requiring transitioning to audio format only (telephone).  All issues noted in this document were discussed and addressed.  No physical exam could be performed with this format.  Please refer to the patient's chart for her  consent to telehealth for [facility].    Date:  03/02/2019   ID:  Catherine [name], DOB [birth year], MRN [id number]  Patient Location:  [address] [place] [address]   Provider location:   [facility], [place] office  PCP:  Danelle Berry, NP  Cardiologist:  No primary care provider on file.   Chief Complaint:  Sweating, anxious, chest pain    History of Present Illness:    Catherine [name] is a 78 y.o. female who presents via audio/video conferencing for a telehealth visit today.   The patient does not symptoms concerning for COVID-19 infection (fever, chills, cough, or new SHORTNESS OF BREATH).  Please refer to prior office visit for complete details: Patient has a past medical history of  Please refer to prior office visit for complete details. She was seen in clinic previously by Christell Faith, PA-C on 11/20/2018. First visit with me.  Patient has a past medical history of: CAD  Aortic stenosis s/p TAVR in 03/2705 complicated by complete heart block s/p PPM complicated byhemothorax requiring chest tube. Hypercholesteremia TIA CVA Chronic diastolic congestive heart failure Syncope The patient is here  for follow up of her hypertension and lower extremity swelling. Previously followed by Dr. Burt Knack in [place]  c[facility] in 08/2015 showed single vessel CAD with heavy calcification of the entire LAD and a moderate eccentric mid LAD stenosis estimated at 70%.   known severe aortic stenosis Medical management was advised for her CAD  TAVR in 01/3761 which was complicated by complete heart block requiring PPM. Her PPM implantation was complicated by a hemothorax requiring chest tube placement.   Echo from 03/2017 showed and EF of 60-65%, no RWMA, Gr1DD, normal functioning aortic valve replacement without stenosis or regurgitation.   weakness and malaise which was felt to be secondary to deconditioning and underlying anemia.  polypharmacy   contributing to her symptoms, in particular she was on multiple psychotropic/sedating medications.  "not doing too good" this morning Last night sweating, chest pain, BP was 213/108 Pulse 114 This AM 178/90, pulse 68 Weight 199, same as dec Has been wanted to take her to the emergency room last night but she did not go  At baseline, BP runs 140/80 ("the best Ive had"), sounds like it typically runs higher  Off lipitor, stopped on her own, "read bad stuff" PMD stopped "BP pill", metoprolol, as far she understands Notes not available  Leg swelling stable Takes lasix   Date Total Chol. LDL Glucose Creatine   11/2018   162 1.27   12/2018   178 0.95     Prior CV studies:   The following studies were reviewed today:  Echo  10/2018 Left ventricle: The cavity size was normal. There  was mild   concentric hypertrophy. Systolic function was normal. The   estimated ejection fraction was in the range of 50% to 55%. Wall   motion was normal; there were no regional wall motion   abnormalities. Doppler parameters are consistent with abnormal   left ventricular relaxation (grade 1 diastolic dysfunction). - Aortic valve: A Bioprosethic prosthesis was present.  Peak   velocity (S): 143 cm/s. Mean gradient (S): 6 mm Hg. - Left atrium: The atrium was moderately dilated. - Right ventricle: Systolic function was normal. - Right atrium: The atrium was mildly dilated. - Tricuspid valve: There was mild regurgitation. - Pulmonary arteries: Systolic pressure was within the normal   range.   Past Medical History:  Diagnosis Date  . Acute colitis   . Anemia   . CAD (coronary artery disease)    a. heavy calcification of the entire LAD & mod eccentric mid LAD stenosis, estimated @ 70%, mild nonobs stenosis of RCA and LCx, severely calcified Ao valve with restricted valve mobility and 2+ AI    . Cancer [facility]) 1981   breast  . CHB (complete heart block) ([place]) 08/31/2015   [place] DR model ZO1096 (serial number [id number] )   . Chronic diastolic congestive heart failure ([place])    a. echo 08/31/2015: EF 65-70%, nl WM, GR1DD, Ao valve stent bioprosthesis was present and functioning nl, no regurg, LA mildly dilated, PASP 46 mm Hg  . Collagen vascular disease ([place])   . Colon polyps   . Depression   . Diabetes mellitus type II   . Fibromyalgia   . HTN (hypertension)   . Hypercholesteremia   . Hypothyroidism   . IBS (irritable bowel syndrome)   . Rectal bleeding   . Rheumatoid arthritis(714.0)   . S/P TAVR (transcatheter aortic valve replacement) 08/30/2015   26 mm Edwards Sapien 3 transcatheter heart valve placed via open right transfemoral approach  . Shortness of breath dyspnea   . Stenosis of aortic valve   . Thyroid disease    Past Surgical History:  Procedure Laterality Date  . ABDOMINAL SURGERY     Intestinal surgery  . CARDIAC SURGERY    . CESAREAN SECTION     x 2  . EP IMPLANTABLE DEVICE N/A 08/31/2015   Procedure: Pacemaker Implant;  Surgeon: Will Meredith Leeds, MD; [place] (serial number [id number] ); Laterality: Right  . LEFT OOPHORECTOMY    . MASTECTOMY Left   . polyp     . self inflicted  chest wound    . [place]   lower back  . TEE WITHOUT CARDIOVERSION N/A 08/30/2015   Procedure: TRANSESOPHAGEAL ECHOCARDIOGRAM (TEE);  Surgeon: Sherren Mocha, MD;  Location: [facility];  Service: Open Heart Surgery;  Laterality: N/A;  . TOTAL ABDOMINAL HYSTERECTOMY    . TRANSCATHETER AORTIC VALVE REPLACEMENT, TRANSFEMORAL N/A 08/30/2015   Procedure: TRANSCATHETER AORTIC VALVE REPLACEMENT, TRANSFEMORAL;  Surgeon: Sherren Mocha, MD;  Location: [facility];  Service: Open Heart Surgery;  Laterality: N/A;     Current Outpatient Medications on File Prior to Visit  Medication Sig Dispense Refill  . aspirin EC 81 MG tablet Take 81 mg by mouth daily.    Marland Kitchen atorvastatin (LIPITOR) 40 MG tablet Take 1 tablet by mouth daily.    . budesonide (PULMICORT) 0.5 MG/2ML nebulizer solution Take 2 mLs (0.5 mg total) by nebulization 2 (two) times daily. 120 mL 0  . buPROPion (WELLBUTRIN XL) 300 MG 24 hr  tablet Take 1 tablet (300 mg total) by mouth daily. 90 tablet 0  . clonazePAM (KLONOPIN) 1 MG tablet Take 0.5-1 tablets (0.5-1 mg total) by mouth at bedtime. 30 tablet 0  . furosemide (LASIX) 20 MG tablet Take 1 tablet (20 mg total) by mouth daily. 30 tablet 0  . levothyroxine (SYNTHROID, LEVOTHROID) 100 MCG tablet Take 100 mcg by mouth daily before breakfast.     . linagliptin (TRADJENTA) 5 MG TABS tablet Take 5 mg by mouth daily.    . metoprolol tartrate (LOPRESSOR) 25 MG tablet TAKE 0.5 TABLETS (12.5 MG TOTAL) BY MOUTH 2 (TWO) TIMES DAILY. 90 tablet 0  . mirabegron ER (MYRBETRIQ) 50 MG TB24 tablet Take 1 tablet (50 mg total) by mouth daily. 90 tablet 3  . pantoprazole (PROTONIX) 40 MG tablet Take 1 tablet (40 mg total) by mouth daily. 30 tablet 0  . PARoxetine (PAXIL) 40 MG tablet Take 1 tablet (40 mg total) by mouth daily. 90 tablet 0  . traZODone (DESYREL) 50 MG tablet Take 0.5 tablets (25 mg total) by mouth at bedtime. 15 tablet 0   No current facility-administered medications on file prior to visit.      Allergies:   Sulfa antibiotics and Latex   Social History   Tobacco Use  . Smoking status: Never Smoker  . Smokeless tobacco: Never Used  Substance Use Topics  . Alcohol use: No    Alcohol/week: 0.0 standard drinks  . Drug use: No     Current Outpatient Medications on File Prior to Visit  Medication Sig Dispense Refill  . aspirin EC 81 MG tablet Take 81 mg by mouth daily.    Marland Kitchen atorvastatin (LIPITOR) 40 MG tablet Take 1 tablet by mouth daily.    . budesonide (PULMICORT) 0.5 MG/2ML nebulizer solution Take 2 mLs (0.5 mg total) by nebulization 2 (two) times daily. 120 mL 0  . buPROPion (WELLBUTRIN XL) 300 MG 24 hr tablet Take 1 tablet (300 mg total) by mouth daily. 90 tablet 0  . clonazePAM (KLONOPIN) 1 MG tablet Take 0.5-1 tablets (0.5-1 mg total) by mouth at bedtime. 30 tablet 0  . furosemide (LASIX) 20 MG tablet Take 1 tablet (20 mg total) by mouth daily. 30 tablet 0  . levothyroxine (SYNTHROID, LEVOTHROID) 100 MCG tablet Take 100 mcg by mouth daily before breakfast.     . linagliptin (TRADJENTA) 5 MG TABS tablet Take 5 mg by mouth daily.    . metoprolol tartrate (LOPRESSOR) 25 MG tablet TAKE 0.5 TABLETS (12.5 MG TOTAL) BY MOUTH 2 (TWO) TIMES DAILY. 90 tablet 0  . mirabegron ER (MYRBETRIQ) 50 MG TB24 tablet Take 1 tablet (50 mg total) by mouth daily. 90 tablet 3  . pantoprazole (PROTONIX) 40 MG tablet Take 1 tablet (40 mg total) by mouth daily. 30 tablet 0  . PARoxetine (PAXIL) 40 MG tablet Take 1 tablet (40 mg total) by mouth daily. 90 tablet 0  . traZODone (DESYREL) 50 MG tablet Take 0.5 tablets (25 mg total) by mouth at bedtime. 15 tablet 0   No current facility-administered medications on file prior to visit.      Family Hx: The patient's family history includes Depression in her sister, sister, and sister; Hypertension in her mother and sister; Lung cancer in her sister; Stroke in her sister. There is no history of Heart attack.  ROS:   Please see the history of present  illness.    Review of Systems  Constitutional: Negative.  Sweating, chest pain  Respiratory: Negative.   Cardiovascular: Negative.   Gastrointestinal: Negative.   Musculoskeletal: Negative.   Neurological: Negative.   Psychiatric/Behavioral: Negative.   All other systems reviewed and are negative.    Labs/Other Tests and Data Reviewed:    Recent Labs: 10/24/2018: ALT 11; B Natriuretic Peptide 211.0; Magnesium 1.9 10/29/2018: Hemoglobin 8.5; Platelets 227 12/11/2018: BUN 24; Creatinine, Ser 0.95; Potassium 4.4; Sodium 133   Recent Lipid Panel Lab Results  Component Value Date/Time   CHOL 186 03/10/2017 04:24 AM   TRIG 147 03/10/2017 04:24 AM   HDL 43 03/10/2017 04:24 AM   CHOLHDL 4.3 03/10/2017 04:24 AM   LDLCALC 114 (H) 03/10/2017 04:24 AM    Wt Readings from Last 3 Encounters:  03/02/19 199 lb (90.3 kg)  11/20/18 199 lb (90.3 kg)  10/29/18 203 lb (92.1 kg)     Exam:    Vital Signs:  BP (!) 178/90 Comment: before medications  Pulse 68   Wt 199 lb (90.3 kg)   BMI 35.25 kg/m    Well nourished, well developed female in no acute distress.   ASSESSMENT & PLAN:    1. Chronic diastolic congestive heart failure (HCC) Sounds euvolemic No med changes  2. Complete heart block ([place]) Has pacemaker Followed by EP  3. Hyperlipidemia, unspecified hyperlipidemia type Encouraged her to restart lipitor Worried about side effects  4. Hypertension, unspecified type Running high, Will restart metoprolol.  We will call her in 8 days to follow-up on her blood pressure measurements May need ARB such as losartan,  or Imdur  5. Aortic valve stenosis, etiology of cardiac valve disease unspecified S/p TAVR, Doing well Will need periodic echocardiogram  6.  Coronary artery disease with stable angina Unclear but possible anginal episode last night with hypertension sweating mild chest discomfort Recommended she take nitro, will work to get her blood pressure down  Recommend she monitor blood pressure during the week and we will call her next week to follow-up and see if additional medication changes are needed Recommend she go to the emergency room if pain does not go away with nitro      COVID-19 Education: The signs and symptoms of COVID-19 were discussed with the patient and how to seek care for testing (follow up with PCP or arrange E-visit).  The importance of social distancing was discussed today.  Patient Risk:   After full review of this patients clinical status, I feel that they are at least moderate risk at this time.  Time:   Today, I have spent 25 minutes with the patient with telehealth technology discussing stable angina symptoms, hypertension management, underlying coronary artery disease, history of TAVR, and when to go to the hospital.     Medication Adjustments/Labs and Tests Ordered: Current medicines are reviewed at length with the patient today.  Concerns regarding medicines are outlined above.   Tests Ordered: No tests ordered   Medication Changes: No changes made   Disposition: Follow-up in 8 days with a visit   Signed, Ida Rogue, MD  03/02/2019 9:05 AM    [place] Office [address], [place], [place] [address]

## 2019-03-02 NOTE — Telephone Encounter (Signed)
LMOV for patient to call back 

## 2019-03-02 NOTE — Patient Instructions (Addendum)
Televisit in 8 days to go over blood pressure   Medication Instructions:  Your physician has recommended you make the following change in your medication. This was sent to [facility].  1. Nitroglycerin as needed for chest pain and high blood pressures. If a single episode of chest pain is not relieved by one tablet, the patient will try another within 5 minutes; and if this doesn't relieve the pain, the patient is instructed to call 911 for transportation to an emergency department. 2. Lipitor 40 mg once daily sent to [facility]  3. RESTART Metoprolol 25 mg 1/2 tablet (12.5 mg) twice a day   If you need a refill on your cardiac medications before your next appointment, please call your pharmacy.    Lab work: No new labs needed   If you have labs (blood work) drawn today and your tests are completely normal, you will receive your results only by: Marland Kitchen MyChart Message (if you have MyChart) OR . A paper copy in the mail If you have any lab test that is abnormal or we need to change your treatment, we will call you to review the results.   Testing/Procedures: No new testing needed   Follow-Up: At [facility], you and your health needs are our priority.  As part of our continuing mission to provide you with exceptional heart care, we have created designated Provider Care Teams.  These Care Teams include your primary Cardiologist (physician) and Advanced Practice Providers (APPs -  Physician Assistants and Nurse Practitioners) who all work together to provide you with the care you need, when you need it.  . You will need a follow up appointment 8 days with telephone  . Providers on your designated Care Team:   . Murray Hodgkins, NP . Christell Faith, PA-C . Marrianne Mood, PA-C  Any Other Special Instructions Will Be Listed Below (If Applicable).  For educational health videos Log in to : [email or web address] Or : [email or web address], password : [email or web address]

## 2019-03-03 ENCOUNTER — Telehealth (INDEPENDENT_AMBULATORY_CARE_PROVIDER_SITE_OTHER): Payer: Medicare Other | Admitting: Urology

## 2019-03-03 ENCOUNTER — Telehealth: Payer: Self-pay | Admitting: Urology

## 2019-03-03 ENCOUNTER — Other Ambulatory Visit: Payer: Self-pay

## 2019-03-03 DIAGNOSIS — R310 Gross hematuria: Secondary | ICD-10-CM | POA: Diagnosis not present

## 2019-03-03 DIAGNOSIS — R300 Dysuria: Secondary | ICD-10-CM

## 2019-03-03 MED ORDER — AMOXICILLIN-POT CLAVULANATE 875-125 MG PO TABS: 1.0000 | ORAL_TABLET | Freq: Two times a day (BID) | ORAL | 0 refills | Status: DC

## 2019-03-03 NOTE — Progress Notes (Signed)
Called patient.  No answer and voicemail was full.  Will try again later.

## 2019-03-03 NOTE — Telephone Encounter (Signed)
Would you call Catherine [name] and make a follow up telephone visit in 2 weeks?

## 2019-03-03 NOTE — Progress Notes (Signed)
Virtual Visit via Telephone Note  I connected with Catherine [name] on 03/03/19 at 1053 by telephone and verified that I am speaking with the correct person using two identifiers.  She is located at home.  I am located at my home.    This visit type was conducted due to national recommendations for restrictions regarding the COVID-19 Pandemic (e.g. social distancing).  This format is felt to be most appropriate for this patient at this time.  All issues noted in this document were discussed and addressed.  No physical exam was performed.   I discussed the limitations, risks, security and privacy concerns of performing an evaluation and management service by telephone and the availability of in person appointments. I also discussed with the patient that there may be a patient responsible charge related to this service. The patient expressed understanding and agreed to proceed.   History of Present Illness: [name] is a 78 year old Caucasian female with a history of hematuria and frequency who continues to have intermittent gross hematuria since her hospitalization in 10/2018 for a malfunctioning pacemaker.   She states that three days a week she is seeing blood in her urine that is the color of [place].  She has no clots associated with the hematuria.  She is complaining of dysuria.  Patient denies any suprapubic/flank pain.  Patient denies any fevers, chills, nausea or vomiting.      Observations/Objective: Taking Myrbetriq 50 mg daily  No recent CBC  Assessment and Plan:  1. History of hematuria Hematuria work up completed in 0-08/2018 - findings positive for 3 mm right renal stone and a [place] 2 renal cyst   2. Gross hematuria Associated with dysuria - will call in Augmentin 875/125, BID x 5 days - recheck with patient in two weeks, if hematuria persists will need an in-office-visit   Follow Up Instructions:  Will schedule a telephone in two weeks.     I discussed the  assessment and treatment plan with the patient. The patient was provided an opportunity to ask questions and all were answered. The patient agreed with the plan and demonstrated an understanding of the instructions.   The patient was advised to call back or seek an in-person evaluation if the symptoms worsen or if the condition fails to improve as anticipated.  I provided 23 minutes of non-face-to-face time during this encounter.   Tex Conroy, PA-C

## 2019-03-05 NOTE — Progress Notes (Signed)
Telephone visit made for 03/11/2019 at 1:00pm.

## 2019-03-11 ENCOUNTER — Telehealth: Payer: Self-pay | Admitting: Cardiovascular Disease

## 2019-03-12 ENCOUNTER — Other Ambulatory Visit: Payer: Self-pay

## 2019-03-12 ENCOUNTER — Telehealth (INDEPENDENT_AMBULATORY_CARE_PROVIDER_SITE_OTHER): Payer: Medicare Other | Admitting: Cardiovascular Disease

## 2019-03-12 DIAGNOSIS — I5032 Chronic diastolic (congestive) heart failure: Secondary | ICD-10-CM

## 2019-03-12 DIAGNOSIS — R0602 Shortness of breath: Secondary | ICD-10-CM

## 2019-03-12 DIAGNOSIS — I442 Atrioventricular block, complete: Secondary | ICD-10-CM

## 2019-03-12 DIAGNOSIS — I350 Nonrheumatic aortic (valve) stenosis: Secondary | ICD-10-CM | POA: Diagnosis not present

## 2019-03-12 DIAGNOSIS — M797 Fibromyalgia: Secondary | ICD-10-CM

## 2019-03-12 DIAGNOSIS — E785 Hyperlipidemia, unspecified: Secondary | ICD-10-CM

## 2019-03-12 DIAGNOSIS — Z953 Presence of xenogenic heart valve: Secondary | ICD-10-CM

## 2019-03-12 DIAGNOSIS — Z79899 Other long term (current) drug therapy: Secondary | ICD-10-CM

## 2019-03-12 DIAGNOSIS — R45851 Suicidal ideations: Secondary | ICD-10-CM

## 2019-03-12 DIAGNOSIS — I25118 Atherosclerotic heart disease of native coronary artery with other forms of angina pectoris: Secondary | ICD-10-CM | POA: Diagnosis not present

## 2019-03-12 DIAGNOSIS — I10 Essential (primary) hypertension: Secondary | ICD-10-CM

## 2019-03-12 DIAGNOSIS — E119 Type 2 diabetes mellitus without complications: Secondary | ICD-10-CM

## 2019-03-12 MED ORDER — LOSARTAN POTASSIUM 50 MG PO TABS: 50.0000 mg | ORAL_TABLET | ORAL | 3 refills | Status: DC

## 2019-03-12 NOTE — Patient Instructions (Addendum)
Medication Instructions:  Your physician has recommended you make the following change in your medication:  1. START Losartan 50 mg and take HALF tablet (25 mg) once daily. After one week if blood pressures remain elevated then increase to WHOLE tablet (50 mg) once daily.   Your physician has requested that you regularly monitor and record your blood pressure readings at home. Please use the same machine at the same time of day to check your readings and record them to bring to your follow-up visit.  If you need a refill on your cardiac medications before your next appointment, please call your pharmacy.    Lab work: No new labs needed   If you have labs (blood work) drawn today and your tests are completely normal, you will receive your results only by: Marland Kitchen MyChart Message (if you have MyChart) OR . A paper copy in the mail If you have any lab test that is abnormal or we need to change your treatment, we will call you to review the results.   Testing/Procedures: No new testing needed   Follow-Up: At [facility], you and your health needs are our priority.  As part of our continuing mission to provide you with exceptional heart care, we have created designated Provider Care Teams.  These Care Teams include your primary Cardiologist (physician) and Advanced Practice Providers (APPs -  Physician Assistants and Nurse Practitioners) who all work together to provide you with the care you need, when you need it.  . You will need a follow up appointment in 2 month  . Providers on your designated Care Team:   . Murray Hodgkins, NP . Christell Faith, PA-C . Marrianne Mood, PA-C  Any Other Special Instructions Will Be Listed Below (If Applicable).  For educational health videos Log in to : [email or web address] Or : [email or web address], password : [email or web address]

## 2019-03-12 NOTE — Progress Notes (Addendum)
Virtual Visit via Telephone Note   This visit type was conducted due to national recommendations for restrictions regarding the COVID-19 Pandemic (e.g. social distancing) in an effort to limit this patient's exposure and mitigate transmission in our community.  Due to her co-morbid illnesses, this patient is at least at moderate risk for complications without adequate follow up.  This format is felt to be most appropriate for this patient at this time.  The patient did not have access to video technology/had technical difficulties with video requiring transitioning to audio format only (telephone).  All issues noted in this document were discussed and addressed.  No physical exam could be performed with this format.  Please refer to the patient's chart for her  consent to telehealth for [facility].    Date:  03/12/2019   ID:  Catherine [name], DOB [birth year], MRN [id number]  Patient Location:  [address] [place] [address]   Provider location:   [facility], [place] office  PCP:  Danelle Berry, NP  Cardiologist:  Patsy Baltimore  Chief Complaint:  High blood pressure    History of Present Illness:    Catherine [name] is a 78 y.o. female who presents via audio/video conferencing for a telehealth visit today.   The patient does not symptoms concerning for COVID-19 infection (fever, chills, cough, or new SHORTNESS OF BREATH).   Patient has a past medical history of CAD Aortic stenosis s/p TAVR in 0/5397 complicated by complete heart block s/p PPM complicated byhemothorax requiring chest tube. Hypercholesteremia TIA CVA Chronic diastolic congestive heart failure Syncope The patientis here for follow up of her hypertension and lower extremity swelling. Previously followed by Dr. Burt Knack in [place]  "not doing too good" Blood pressure up, 3 to 4 days Went back on metoprolol 12.5 BID  Worried that blood pressure is elevated, also labile Labile pulse rate   4/6 144/84,  Pulse 113  4/7: 170/95, Pulse 81  212/72 pulse 90  Today: 4/9 148/116 Pulse 67  Weight 201 pounds Feels her weight is stable Still with significant leg weakness Previously felt to be from deconditioning, anemia Chronic mild leg swelling  Denies significant chest pain or shortness of breath on exertion  Other history reviewed polypharmacy contributing to her symptoms, in particular she was on multiple psychotropic/sedating medications.  Off lipitor, stopped on her own, "read bad stuff"   Prior CV studies:   The following studies were reviewed today:  cath in 08/2015 showed single vessel CAD with heavy calcification of the entire LAD and a moderate eccentric mid LAD stenosis estimated at 70%.  known severe aortic stenosis Medical management was advised for her CAD  TAVR in 05/7340 which was complicated by complete heart block requiring PPM. Her PPM implantation was complicated by a hemothorax requiring chest tube placement.   Echo from 03/2017 showed and EF of 60-65%, no RWMA, Gr1DD, normal functioning aortic valve replacement without stenosis or regurgitation.   Past Medical History:  Diagnosis Date  . Acute colitis   . Anemia   . CAD (coronary artery disease)    a. heavy calcification of the entire LAD & mod eccentric mid LAD stenosis, estimated @ 70%, mild nonobs stenosis of RCA and LCx, severely calcified Ao valve with restricted valve mobility and 2+ AI    . Cancer [facility]) 1981   breast  . CHB (complete heart block) ([place]) 08/31/2015   [place] DR model PF7902 (serial number [id number] )   .  Chronic diastolic congestive heart failure ([place])    a. echo 08/31/2015: EF 65-70%, nl WM, GR1DD, Ao valve stent bioprosthesis was present and functioning nl, no regurg, LA mildly dilated, PASP 46 mm Hg  . Collagen vascular disease ([place])   . Colon polyps   . Depression   . Diabetes mellitus type II   . Fibromyalgia   . HTN (hypertension)   .  Hypercholesteremia   . Hypothyroidism   . IBS (irritable bowel syndrome)   . Rectal bleeding   . Rheumatoid arthritis(714.0)   . S/P TAVR (transcatheter aortic valve replacement) 08/30/2015   26 mm Edwards Sapien 3 transcatheter heart valve placed via open right transfemoral approach  . Shortness of breath dyspnea   . Stenosis of aortic valve   . Thyroid disease    Past Surgical History:  Procedure Laterality Date  . ABDOMINAL SURGERY     Intestinal surgery  . CARDIAC SURGERY    . CESAREAN SECTION     x 2  . EP IMPLANTABLE DEVICE N/A 08/31/2015   Procedure: Pacemaker Implant;  Surgeon: Will Meredith Leeds, MD; [place] (serial number [id number] ); Laterality: Right  . LEFT OOPHORECTOMY    . MASTECTOMY Left   . polyp     . self inflicted chest wound    . [place]   lower back  . TEE WITHOUT CARDIOVERSION N/A 08/30/2015   Procedure: TRANSESOPHAGEAL ECHOCARDIOGRAM (TEE);  Surgeon: Sherren Mocha, MD;  Location: [place];  Service: Open Heart Surgery;  Laterality: N/A;  . TOTAL ABDOMINAL HYSTERECTOMY    . TRANSCATHETER AORTIC VALVE REPLACEMENT, TRANSFEMORAL N/A 08/30/2015   Procedure: TRANSCATHETER AORTIC VALVE REPLACEMENT, TRANSFEMORAL;  Surgeon: Sherren Mocha, MD;  Location: [place];  Service: Open Heart Surgery;  Laterality: N/A;     No outpatient medications have been marked as taking for the 03/12/19 encounter (Appointment) with Minna Merritts, MD.     Allergies:   Sulfa antibiotics and Latex   Social History   Tobacco Use  . Smoking status: Never Smoker  . Smokeless tobacco: Never Used  Substance Use Topics  . Alcohol use: No    Alcohol/week: 0.0 standard drinks  . Drug use: No     Current Outpatient Medications on File Prior to Visit  Medication Sig Dispense Refill  . amoxicillin-clavulanate (AUGMENTIN) 875-125 MG tablet Take 1 tablet by mouth every 12 (twelve) hours. 10 tablet 0  . aspirin EC 81 MG tablet Take 81 mg by mouth  daily.    Marland Kitchen atorvastatin (LIPITOR) 40 MG tablet Take 1 tablet (40 mg total) by mouth daily. 90 tablet 3  . budesonide (PULMICORT) 0.5 MG/2ML nebulizer solution Take 2 mLs (0.5 mg total) by nebulization 2 (two) times daily. 120 mL 0  . buPROPion (WELLBUTRIN XL) 300 MG 24 hr tablet Take 1 tablet (300 mg total) by mouth daily. 90 tablet 0  . clonazePAM (KLONOPIN) 1 MG tablet Take 0.5-1 tablets (0.5-1 mg total) by mouth at bedtime. 30 tablet 0  . furosemide (LASIX) 20 MG tablet Take 1 tablet (20 mg total) by mouth daily. 30 tablet 0  . levothyroxine (SYNTHROID, LEVOTHROID) 100 MCG tablet Take 100 mcg by mouth daily before breakfast.     . linagliptin (TRADJENTA) 5 MG TABS tablet Take 5 mg by mouth daily.    . metoprolol tartrate (LOPRESSOR) 25 MG tablet TAKE 0.5 TABLETS (12.5 MG TOTAL) BY MOUTH 2 (TWO) TIMES DAILY. 90 tablet 0  . mirabegron ER (  MYRBETRIQ) 50 MG TB24 tablet Take 1 tablet (50 mg total) by mouth daily. 90 tablet 3  . nitroGLYCERIN (NITROSTAT) 0.4 MG SL tablet Place 1 tablet (0.4 mg total) under the tongue every 5 (five) minutes as needed for chest pain. 25 tablet 2  . pantoprazole (PROTONIX) 40 MG tablet Take 1 tablet (40 mg total) by mouth daily. 30 tablet 0  . PARoxetine (PAXIL) 40 MG tablet Take 1 tablet (40 mg total) by mouth daily. 90 tablet 0  . traZODone (DESYREL) 50 MG tablet Take 0.5 tablets (25 mg total) by mouth at bedtime. 15 tablet 0   No current facility-administered medications on file prior to visit.      Family Hx: The patient's family history includes Depression in her sister, sister, and sister; Hypertension in her mother and sister; Lung cancer in her sister; Stroke in her sister. There is no history of Heart attack.  ROS:   Please see the history of present illness.    Review of Systems  Constitutional: Negative.   Respiratory: Positive for shortness of breath.   Cardiovascular: Negative.   Gastrointestinal: Negative.   Musculoskeletal: Negative.         Gait instability, weakness  Neurological: Negative.   Psychiatric/Behavioral: The patient is nervous/anxious.   All other systems reviewed and are negative.     Labs/Other Tests and Data Reviewed:    Recent Labs: 10/24/2018: ALT 11; B Natriuretic Peptide 211.0; Magnesium 1.9 10/29/2018: Hemoglobin 8.5; Platelets 227 12/11/2018: BUN 24; Creatinine, Ser 0.95; Potassium 4.4; Sodium 133   Recent Lipid Panel Lab Results  Component Value Date/Time   CHOL 186 03/10/2017 04:24 AM   TRIG 147 03/10/2017 04:24 AM   HDL 43 03/10/2017 04:24 AM   CHOLHDL 4.3 03/10/2017 04:24 AM   LDLCALC 114 (H) 03/10/2017 04:24 AM    Wt Readings from Last 3 Encounters:  03/02/19 199 lb (90.3 kg)  11/20/18 199 lb (90.3 kg)  10/29/18 203 lb (92.1 kg)     Exam:    Vital Signs: Vital signs may also be detailed in the HPI  4/6 144/84,  Pulse 113  4/7: 170/95, Pulse 81  212/72 pulse 90  Today: 4/9 vital signs 148/116 Pulse 67  Weight 201 pounds Sugar 76  Well nourished, well developed female in no acute distress. Constitutional:  oriented to person, place, and time. No distress.    ASSESSMENT & PLAN:    1. Chronic diastolic congestive heart failure (HCC) Euvolemic, no medication changes made Chronic stable lower extremity edema  2. Complete heart block ([place]) Has pacemaker Followed by EP  3. Hyperlipidemia, unspecified hyperlipidemia type Encouraged her to restart lipitor Reassurance provided concerning a statin Stopped on her own in the past  4. Hypertension, unspecified type Continue metoprolol 12.5 twice daily Start losartan 25 mg daily with titration up to 50 mg daily if blood pressure continues to run high  5. Aortic valve stenosis, etiology of cardiac valve disease unspecified S/p TAVR, Doing well Will need periodic echocardiogram  6.  Coronary artery disease with stable angina Denies further chest pain symptoms on follow-up call today  No further workup at this  time. Continue current medication regimen.    COVID-19 Education: The signs and symptoms of COVID-19 were discussed with the patient and how to seek care for testing (follow up with PCP or arrange E-visit).  The importance of social distancing was discussed today.  Patient Risk:   After full review of this patients clinical status, I feel that they  are at least moderate risk at this time.  Time:   Today, I have spent 25 minutes with the patient with telehealth technology discussing the cardiac and medical problems/diagnoses detailed above  .Discussed stable anginal symptoms, management of blood pressure    Medication Adjustments/Labs and Tests Ordered: Current medicines are reviewed at length with the patient today.  Concerns regarding medicines are outlined above.   Tests Ordered: No tests ordered   Medication Changes: Detailed above  Disposition: Follow-up in 6 months   Signed, Ida Rogue, MD  03/12/2019 3:08 PM    [place] Office [address] [place] [address], [place], [place] [address]

## 2019-03-13 ENCOUNTER — Other Ambulatory Visit (HOSPITAL_COMMUNITY): Payer: Self-pay | Admitting: Psychiatry

## 2019-03-13 DIAGNOSIS — F331 Major depressive disorder, recurrent, moderate: Secondary | ICD-10-CM

## 2019-03-17 ENCOUNTER — Telehealth: Payer: Self-pay | Admitting: Urology

## 2019-03-17 ENCOUNTER — Telehealth (INDEPENDENT_AMBULATORY_CARE_PROVIDER_SITE_OTHER): Payer: Medicare Other | Admitting: Urology

## 2019-03-17 ENCOUNTER — Other Ambulatory Visit: Payer: Self-pay

## 2019-03-17 DIAGNOSIS — Z87448 Personal history of other diseases of urinary system: Secondary | ICD-10-CM

## 2019-03-17 DIAGNOSIS — N200 Calculus of kidney: Secondary | ICD-10-CM

## 2019-03-17 DIAGNOSIS — R310 Gross hematuria: Secondary | ICD-10-CM | POA: Diagnosis not present

## 2019-03-17 NOTE — Progress Notes (Signed)
Virtual Visit via Telephone Note  I connected with Catherine [name] on 03/17/19 at [place] by telephone and verified that I am speaking with the correct person using two identifiers.  She is located at home.  I am located at my home.    This visit type was conducted due to national recommendations for restrictions regarding the COVID-19 Pandemic (e.g. social distancing).  This format is felt to be most appropriate for this patient at this time.  All issues noted in this document were discussed and addressed.  No physical exam was performed.   I discussed the limitations, risks, security and privacy concerns of performing an evaluation and management service by telephone and the availability of in person appointments. I also discussed with the patient that there may be a patient responsible charge related to this service. The patient expressed understanding and agreed to proceed.   History of Present Illness: Catherine [name] is a 78 year old Caucasian female with a history of hematuria and frequency who continues to have intermittent gross hematuria since her hospitalization in 10/2018 for a malfunctioning pacemaker.   She is a difficult historian as she frequency gets off topic and is hard to redirect.  She was given an antibiotic (Augmentin) and then she states her PCP gave her Cipro.  She is still having intermittent gross hematuria, but she states that it is occurring less frequently.  It is not occurring everyday.  She is having episodes four times a week.  She has no clots associated with the hematuria.  She is complaining of dysuria.  Patient denies any suprapubic/flank pain.  Patient denies any fevers, chills, nausea or vomiting.      Observations/Objective: Taking Myrbetriq 50 mg daily  No recent CBC  Assessment and Plan:  1. History of hematuria Hematuria work up completed in 08/2018 - findings positive for 3 mm right renal stone and a [place] 2 renal cyst   2. Gross hematuria Still occurring,  but frequency is decreased.   Follow Up Instructions:  Will schedule a follow up appointment in three months for CBC and KUB.     I discussed the assessment and treatment plan with the patient. The patient was provided an opportunity to ask questions and all were answered. The patient agreed with the plan and demonstrated an understanding of the instructions.   The patient was advised to call back or seek an in-person evaluation if the symptoms worsen or if the condition fails to improve as anticipated.  I provided 30 minutes of non-face-to-face time during this encounter.   Kishon Garriga, PA-C

## 2019-03-17 NOTE — Telephone Encounter (Signed)
Would you call Catherine [name] and have her come in for an appointment in three months for KUB and office visit?

## 2019-03-18 ENCOUNTER — Other Ambulatory Visit (HOSPITAL_COMMUNITY): Payer: Self-pay

## 2019-03-18 DIAGNOSIS — F331 Major depressive disorder, recurrent, moderate: Secondary | ICD-10-CM

## 2019-03-18 MED ORDER — TRAZODONE HCL 50 MG PO TABS: 25.0000 mg | ORAL_TABLET | Freq: Every day | ORAL | 0 refills | Status: DC

## 2019-03-18 MED ORDER — CLONAZEPAM 1 MG PO TABS: 0.5000 mg | ORAL_TABLET | Freq: Every day | ORAL | 0 refills | Status: DC

## 2019-04-16 ENCOUNTER — Other Ambulatory Visit (HOSPITAL_COMMUNITY): Payer: Self-pay | Admitting: Psychiatry

## 2019-04-16 ENCOUNTER — Ambulatory Visit (INDEPENDENT_AMBULATORY_CARE_PROVIDER_SITE_OTHER): Payer: Medicare Other | Admitting: *Deleted

## 2019-04-16 ENCOUNTER — Other Ambulatory Visit: Payer: Self-pay

## 2019-04-16 DIAGNOSIS — I442 Atrioventricular block, complete: Secondary | ICD-10-CM

## 2019-04-16 DIAGNOSIS — F331 Major depressive disorder, recurrent, moderate: Secondary | ICD-10-CM

## 2019-04-16 LAB — CUP PACEART REMOTE DEVICE CHECK
Date Time Interrogation Session: 20200514190931
Implantable Lead Implant Date: 20160928
Implantable Lead Implant Date: 20160928
Implantable Lead Location: 753859
Implantable Lead Location: 753860
Implantable Pulse Generator Implant Date: 20160928
Pulse Gen Model: 2240
Pulse Gen Serial Number: 7779295

## 2019-04-17 ENCOUNTER — Other Ambulatory Visit (HOSPITAL_COMMUNITY): Payer: Self-pay

## 2019-04-17 DIAGNOSIS — F331 Major depressive disorder, recurrent, moderate: Secondary | ICD-10-CM

## 2019-04-17 MED ORDER — CLONAZEPAM 1 MG PO TABS: 0.5000 mg | ORAL_TABLET | Freq: Every day | ORAL | 0 refills | Status: DC

## 2019-04-17 MED ORDER — TRAZODONE HCL 50 MG PO TABS: 25.0000 mg | ORAL_TABLET | Freq: Every day | ORAL | 0 refills | Status: DC

## 2019-04-23 ENCOUNTER — Encounter: Payer: Self-pay | Admitting: Cardiology

## 2019-04-23 NOTE — Progress Notes (Signed)
Remote pacemaker transmission.   

## 2019-04-28 ENCOUNTER — Telehealth: Payer: Self-pay | Admitting: Cardiovascular Disease

## 2019-04-28 MED ORDER — FUROSEMIDE 20 MG PO TABS: 20.0000 mg | ORAL_TABLET | Freq: Every day | ORAL | 2 refills | Status: DC

## 2019-04-28 NOTE — Telephone Encounter (Signed)
First called and husband answered and said patient was in the eye doctor and wanted me to call back later.  A little bit later, patient and daughter called back. Patient has swelling bilaterally in feet and legs. States it was more than usual.  Denies shortness of breath, chest pain or dizziness.  She took last dose of furosemide 20 mg yesterday. She needs a refill which I have sent to [facility].  Inquired if patient took daily weights. States she has not been doing this in a while.  Daughter does not think patient has been taking her medications routinely as she should. She is going to start preparing meds for her each week to help this.   Today, swelling is down some. Advised patient to take 40 mg furosemide today once she picks up her prescription. Advised her to call us tomorrow with an update. Advised her to start taking daily weights first thing in the morning.

## 2019-04-28 NOTE — Telephone Encounter (Signed)
Pt states she had to call EMS yesterday due to swelling.  Pt states EMS told her to make sure she speak with her cardiologist today.   Pt c/o swelling: STAT is pt has developed SOB within 24 hours  1) How much weight have you gained and in what time span? No weight gain  2) If swelling, where is the swelling located?  Bilateral feet and legs  3) Are you currently taking a fluid pill? yes  Are you currently SOB? Somewhat.   4) Do you have a log of your daily weights (if so, list)? no  5) Have you gained 3 pounds in a day or 5 pounds in a week? no  6) Have you traveled recently? no

## 2019-04-29 NOTE — Telephone Encounter (Signed)
Attempted to contact the patient for follow up on her symptoms.  I called [phone] & left a message on that # to call back.  I called [phone] and Mr. Dapolito answered. He stated he was at the grocery store and asked that I call the patient on her #. I asked if that was the [phone] # and he said, no that was her old #.  He told me to call [phone] as this was her new #. I advised I would call and if no answer, he said he would still tell her I called when he got home.  I attempted to call the new #. N/a & N/ voice mail set up.  Will see if the patient will call back later today and if not, we will try to reach out to her again.

## 2019-04-30 NOTE — Telephone Encounter (Signed)
Attempted to call the patient back x 2 tries. No answer/ No voice mail- will call back.

## 2019-04-30 NOTE — Telephone Encounter (Signed)
Fair enough! Would you recommend she take any additional lasix today?  Last BMP- 12/11/18 BUN/ creatinine- 24/0.95. It look she had been running 1.27-1.54 from 08/2018-11/2018.

## 2019-04-30 NOTE — Telephone Encounter (Signed)
The patient's husband will most likely need to accompany her to the office- he is aware he will need to wait in the lobby.  The screening questions below were asked both of the patient and her husband.       COVID-19 Pre-Screening Questions:  . In the past 7 to 10 days have you had a cough,  shortness of breath, headache, congestion, fever (100 or greater) body aches, chills, sore throat, or sudden loss of taste or sense of smell? No . Have you been around anyone with known Covid 19. No . Have you been around anyone who is awaiting Covid 19 test results in the past 7 to 10 days? No  . Have you been around anyone who has been exposed to Covid 19, or has mentioned symptoms of Covid 19 within the past 7 to 10 days? No  If you have any concerns/questions about symptoms patients report during screening (either on the phone or at threshold). Contact the provider seeing the patient or DOD for further guidance.  If neither are available contact a member of the leadership team.

## 2019-04-30 NOTE — Telephone Encounter (Signed)
I attempted to contact the patient to follow up on her swelling. No answer/ No/ voice mail at her contact # [phone].

## 2019-04-30 NOTE — Telephone Encounter (Signed)
Please have her take a total of Lasix 40 mg bid for today. I do not see that she has potassium on her medication list, though in the past her potassium has ran high normal. In this setting, we can just check a BMP on her tomorrow and address her potassium if needed.

## 2019-04-30 NOTE — Telephone Encounter (Signed)
I received a call from the patient's daughter. She is aware I have tried to reach the patient at the number she asked me to call back on, but there is no answer and no voice mail.  Per her daughter, Catherine [name], the patient and her husband are currently without power and her cell phone died.  Catherine [name] asked me to call the patient back on the patient's husbands #.  I called and spoke with the patient. She is aware that we are unable to see her today, however, we can see her tomorrow. She is also aware that Catherine Faith, PA has given instructions that she may take an extra 40 mg of lasix now (as it is 1:40 pm)- this will give her a total of 80 mg of lasix today.  The patient voices understanding of the above recommendations and is agreeable.  I have scheduled her to come in to see Catherine Faith, PA at 9:30 am tomorrow.  She will most likely require assistance from her husband to get to our office. She is advised that they will both be screened for COVID-19 when coming in the entrance.  She is aware that her husband will not be allowed in the exam room with her. She verbalizes understanding of the above. I have advised them to wear mask from home if available (which they have). She said they would wear gloves- I advised no gloves.

## 2019-04-30 NOTE — Telephone Encounter (Signed)
Her weight appears to be mildly elevated though pretty close to baseline of approximately 199 to 200 pounds.  She has previously been noted to have chronic venous insufficiency and been advised to elevate her legs and wear compression stockings.  I continue to recommend that she do this.  It is concerning if EMS noted crackles on their exam in the field.  Given this, I agree she needs evaluation and would prefer this be done in person so we can assess her and obtain any labs needed in a timely manner.  My schedule is full for the remainder of the day though can see her on Friday, 5/29.

## 2019-04-30 NOTE — Telephone Encounter (Signed)
I spoke with the patient. She states that her swelling is not much improved on lasix 40 mg daily (5/26/, 5/27/, 5/28). She has called the paramedics twice in the last few days- most recently was last night due to her lower extremity swelling and anxiety surrounding this.   She states she was advised last night after the paramedic listened to her that she sounded full of fluid. They recommended transport to the ER for further evaluation/ treatment, but the patient states she refused and was asked to sign AMA papers.   She is able to speak to me over the phone without sounding SOB.   She has not been checking daily weights up until yesterday. 5/27- 200.8 lbs 5/28- 201.4 lbs  She thinks her baseline weight is closer to 197-198 lbs.   I advised the patient I do feel like she needs to be seen, but as she is not SOB in speaking with me, I do not feel like urgent ER evaluation is needed. The patient is requesting to be seen today.  I have advised the patient that Dr. Rockey Situ is out of the office this week. She is aware I will need to send a message to our MD (DOD)/ PA covering the office to see if they can work her in to be seen   She is aware I will need to call her back with any further recommendations.

## 2019-05-01 ENCOUNTER — Encounter: Payer: Self-pay | Admitting: Physician Assistant

## 2019-05-01 ENCOUNTER — Telehealth: Payer: Self-pay

## 2019-05-01 ENCOUNTER — Ambulatory Visit (INDEPENDENT_AMBULATORY_CARE_PROVIDER_SITE_OTHER): Payer: Medicare Other | Admitting: Physician Assistant

## 2019-05-01 ENCOUNTER — Other Ambulatory Visit
Admission: RE | Admit: 2019-05-01 | Discharge: 2019-05-01 | Disposition: A | Discharge status: Home / Self Care | Payer: Medicare Other | Source: Ambulatory Visit | Attending: Physician Assistant | Admitting: Physician Assistant

## 2019-05-01 ENCOUNTER — Other Ambulatory Visit: Payer: Self-pay

## 2019-05-01 VITALS — BP 138/60 | Ht 63.0 in | Wt 200.4 lb

## 2019-05-01 DIAGNOSIS — E1165 Type 2 diabetes mellitus with hyperglycemia: Secondary | ICD-10-CM

## 2019-05-01 DIAGNOSIS — I5032 Chronic diastolic (congestive) heart failure: Secondary | ICD-10-CM

## 2019-05-01 DIAGNOSIS — D649 Anemia, unspecified: Secondary | ICD-10-CM

## 2019-05-01 DIAGNOSIS — Z953 Presence of xenogenic heart valve: Secondary | ICD-10-CM

## 2019-05-01 DIAGNOSIS — Z950 Presence of cardiac pacemaker: Secondary | ICD-10-CM

## 2019-05-01 DIAGNOSIS — I10 Essential (primary) hypertension: Secondary | ICD-10-CM

## 2019-05-01 DIAGNOSIS — I442 Atrioventricular block, complete: Secondary | ICD-10-CM

## 2019-05-01 DIAGNOSIS — I25118 Atherosclerotic heart disease of native coronary artery with other forms of angina pectoris: Secondary | ICD-10-CM

## 2019-05-01 DIAGNOSIS — I872 Venous insufficiency (chronic) (peripheral): Secondary | ICD-10-CM | POA: Diagnosis not present

## 2019-05-01 DIAGNOSIS — I359 Nonrheumatic aortic valve disorder, unspecified: Secondary | ICD-10-CM

## 2019-05-01 DIAGNOSIS — M7989 Other specified soft tissue disorders: Secondary | ICD-10-CM

## 2019-05-01 DIAGNOSIS — E782 Mixed hyperlipidemia: Secondary | ICD-10-CM

## 2019-05-01 DIAGNOSIS — R21 Rash and other nonspecific skin eruption: Secondary | ICD-10-CM

## 2019-05-01 LAB — COMPREHENSIVE METABOLIC PANEL
ALT: 22 U/L (ref 0–44)
AST: 26 U/L (ref 15–41)
Albumin: 4 g/dL (ref 3.5–5.0)
Alkaline Phosphatase: 70 U/L (ref 38–126)
Anion gap: 12 (ref 5–15)
BUN: 25 mg/dL — ABNORMAL HIGH (ref 8–23)
CO2: 26 mmol/L (ref 22–32)
Calcium: 9.4 mg/dL (ref 8.9–10.3)
Chloride: 97 mmol/L — ABNORMAL LOW (ref 98–111)
Creatinine, Ser: 1.17 mg/dL — ABNORMAL HIGH (ref 0.44–1.00)
GFR calc Af Amer: 51 mL/min — ABNORMAL LOW (ref >60)
GFR calc non Af Amer: 44 mL/min — ABNORMAL LOW (ref >60)
Glucose, Bld: 237 mg/dL — ABNORMAL HIGH (ref 70–99)
Potassium: 3.4 mmol/L — ABNORMAL LOW (ref 3.5–5.1)
Sodium: 135 mmol/L (ref 135–145)
Total Bilirubin: 0.6 mg/dL (ref 0.3–1.2)
Total Protein: 8 g/dL (ref 6.5–8.1)

## 2019-05-01 LAB — CBC
HCT: 37.5 % (ref 36.0–46.0)
Hemoglobin: 11.7 g/dL — ABNORMAL LOW (ref 12.0–15.0)
MCH: 27.5 pg (ref 26.0–34.0)
MCHC: 31.2 g/dL (ref 30.0–36.0)
MCV: 88 fL (ref 80.0–100.0)
Platelets: 307 10*3/uL (ref 150–400)
RBC: 4.26 MIL/uL (ref 3.87–5.11)
RDW: 13.9 % (ref 11.5–15.5)
WBC: 8.3 10*3/uL (ref 4.0–10.5)
nRBC: 0 % (ref 0.0–0.2)

## 2019-05-01 MED ORDER — POTASSIUM CHLORIDE ER 10 MEQ PO TBCR: 10.0000 meq | EXTENDED_RELEASE_TABLET | Freq: Every day | ORAL | 5 refills | Status: DC

## 2019-05-01 NOTE — Patient Instructions (Signed)
Medication Instructions:  Your physician recommends that you continue on your current medications as directed. Please refer to the Current Medication list given to you today.  If you need a refill on your cardiac medications before your next appointment, please call your pharmacy.   Lab work: Your physician recommends that you return for lab work today (CMET, CBC) at the medical mall. No appt is needed. Hours are M-F 7AM- 6 PM.  If you have labs (blood work) drawn today and your tests are completely normal, you will receive your results only by: Marland Kitchen MyChart Message (if you have MyChart) OR . A paper copy in the mail If you have any lab test that is abnormal or we need to change your treatment, we will call you to review the results.  Testing/Procedures: None ordered   Follow-Up: At [facility], you and your health needs are our priority.  As part of our continuing mission to provide you with exceptional heart care, we have created designated Provider Care Teams.  These Care Teams include your primary Cardiologist (physician) and Advanced Practice Providers (APPs -  Physician Assistants and Nurse Practitioners) who all work together to provide you with the care you need, when you need it. You will need a follow up appointment in 3 months. You may see Dr. Rockey Situ or Christell Faith, PA-C.    You have an appt on Monday at 1:30 PM with [facility], please wear mask to appt. If you develop fever or cough, please notify their office.

## 2019-05-01 NOTE — Progress Notes (Signed)
Cardiology Office Note Date:  05/01/2019  Patient ID:  Catherine [name], Catherine [name] 1941/04/17, MRN [id number] PCP:  Danelle Berry, NP  Cardiologist:  Dr. Rockey Situ, MD    Chief Complaint: Lower extremity swelling  History of Present Illness: Catherine [name] is a 78 y.o. female with history of CAD medically managed as below, aortic stenosis s/p TAVR in 03/3153 complicated by complete heart block s/p PPM complicated byhemothorax requiring chest tube, HFpEF, venous insufficiency, DM2, HTN, HLD, anemia with gross hematuria, hypothyroidism, RA, IBS, and obesitywho presents for evaluation of lower extremity swelling.  Ms.Welkerunderwent cath in 08/2015 showed single vessel CAD with heavy calcification of the entire LAD and a moderate eccentric mid LAD stenosis estimated at 70%. There was mild nonobstructive stenosis of the LCx and RCA. She had a severely calcified aortic valve with restricted mobility and 2+ aortic insufficiency with known severe aortic stenosis by prior noninvasive evaluation. Medical management was advised for her CAD along with treatment of her aortic valve disease. She underwent TAVR in 0/0867 which was complicated by complete heart block requiring PPM. Her PPM implantation was complicated by a hemothorax requiring chest tube placement. She was last seen by Dr. Burt Knack in 01/2018 and was doing well from a cardiac perspective. Echo from 03/2017 showed and EF of 60-65%, no RWMA, Gr1DD, normal functioning aortic valve replacement without stenosis or regurgitation.   Patient was admitted to [facility] in 10/2018 with lightheadedness, presyncope, and shortness of breath.  It was noted her home BP cuff was giving her erroneous heart rates prompting her to become anxious.  She continued to note frank hematuria.  Her pacemaker was interrogated in the ED and the patient and her family were told by the ED physician that her pacemaker was malfunctioning.  Upon multiple cardiology physicians and pacemaker  rep reviewing the device and print out it was found out the patient's pacemaker was actually not malfunctioning and was working appropriately.  It was noted the patient had 2 brief episodes of atrial tachycardia earlier in the week though no significant events leading up to her admission.  Echo during that admission showed an EF of 50 to 55%, no regional wall motion abnormalities, mild concentric LVH, grade 1 diastolic dysfunction, a bioprosthetic aortic valve was present with a mean gradient of 6 mmHg, moderately dilated left atrium, RV systolic function was normal, mildly dilated right atrium, mild tricuspid regurgitation, PASP normal.  She was seen by cardiology for weakness and malaise which was felt to be secondary to deconditioning and underlying anemia.  Cardiology was reconsulted later during the admission as the patient insisted her device was malfunctioning.  Repeat interrogation again showed the device was functioning appropriately.  It was recommended to hold lisinopril and metoprolol in the setting of orthostatic hypotension.  It was also considered to have the pharmacy review her medicines as polypharmacy was felt to possibly be contributing to her symptoms, in particular she was on multiple psychotropic/sedating medications.  More recently, she has been seen in telemedicine follow up noting poorly controlled BP, leg weakness, and chest pain on 03/02/2019. Weight was documented to be 199 pounds. She was felt to be euvolemic. She was restarted on metoprolol. In telemedicine follow up on 4/8, she noted BP ranging from the 619J to 093O systolic. Weight noted to be 201 pounds. Losartan was added back at that time.   She called our office this week noting increased lower extremity swelling without SOB. Weight was running between 200 and 201 pounds. She  had been taking Lasix 40 mg daily without much improvement. EMS was called to her house with recommendation to go to the ED, though the patient refused.  On 5/28, she was advised to take Lasix 40 mg bid and appointment was scheduled for today.   Labs: 12/2018 - Potassium 4.4, BUN 24, SCr 0.95 10/2018 - HGB 8.5, albumin 3.5  She comes in today noting an approximate 4-week history of increased lower extremity swelling.  There is no shortness of breath.  She has stable two-pillow orthopnea.  She denies any PND or early satiety.  She does report an isolated episode of chest pain approximately 4 weeks ago in which she took 1 sublingual nitroglycerin with improvement in symptoms.  Since then, she has been chest pain-free.  She does state she uses "light salt."  She does not elevate her legs when sitting nor does she wear compression stockings.  She feels like taking 40 to 60 mg of Lasix daily for the past week has helped with her lower extremity swelling.  Her weight has remained stable ranging between 202 101 pounds.  She denies any dizziness, presyncope, or syncope.  Blood pressure has been improving and is mostly running in the 130s over 60s.  Lastly, she notes an erythematous sore rash underneath the right breast and along the left axilla.  She has not discussed this with her PCP.  Of note, her blood sugars have been suboptimally controlled running in the 200s lately.  It appears that she is no longer taking any medications for her diabetes.  She also has not resumed metoprolol as previously recommended by her primary cardiologist.  Past Medical History:  Diagnosis Date   Acute colitis    Anemia    CAD (coronary artery disease)    a. heavy calcification of the entire LAD & mod eccentric mid LAD stenosis, estimated @ 70%, mild nonobs stenosis of RCA and LCx, severely calcified Ao valve with restricted valve mobility and 2+ AI     Cancer ([place]) 1981   breast   CHB (complete heart block) ([place]) 08/31/2015   [employer] Assurity DR model [id number] (serial number [id number] )    Chronic diastolic congestive heart failure ([place])    a. echo 08/31/2015: EF  65-70%, nl WM, GR1DD, Ao valve stent bioprosthesis was present and functioning nl, no regurg, LA mildly dilated, PASP 46 mm Hg   Collagen vascular disease (H[facility])    Colon polyps    Depression    Diabetes mellitus type II    Fibromyalgia    HTN (hypertension)    Hypercholesteremia    Hypothyroidism    IBS (irritable bowel syndrome)    Rectal bleeding    Rheumatoid arthritis(714.0)    S/P TAVR (transcatheter aortic valve replacement) 08/30/2015   26 mm Edwards Sapien 3 transcatheter heart valve placed via open right transfemoral approach   Shortness of breath dyspnea    Stenosis of aortic valve    Thyroid disease     Past Surgical History:  Procedure Laterality Date   ABDOMINAL SURGERY     Intestinal surgery   CARDIAC SURGERY     CESAREAN SECTION     x 2   EP IMPLANTABLE DEVICE N/A 08/31/2015   Procedure: Pacemaker Implant;  Surgeon: Will Meredith Leeds, MD; [place] (serial number [id number] ); Laterality: Right   LEFT OOPHORECTOMY     MASTECTOMY Left    polyp      self inflicted chest wound  [place]   lower back   TEE WITHOUT CARDIOVERSION N/A 08/30/2015   Procedure: TRANSESOPHAGEAL ECHOCARDIOGRAM (TEE);  Surgeon: Sherren Mocha, MD;  Location: [place];  Service: Open Heart Surgery;  Laterality: N/A;   TOTAL ABDOMINAL HYSTERECTOMY     TRANSCATHETER AORTIC VALVE REPLACEMENT, TRANSFEMORAL N/A 08/30/2015   Procedure: TRANSCATHETER AORTIC VALVE REPLACEMENT, TRANSFEMORAL;  Surgeon: Sherren Mocha, MD;  Location: [facility];  Service: Open Heart Surgery;  Laterality: N/A;    Current Meds  Medication Sig   aspirin EC 81 MG tablet Take 81 mg by mouth daily.   atorvastatin (LIPITOR) 40 MG tablet Take 1 tablet (40 mg total) by mouth daily.   clonazePAM (KLONOPIN) 1 MG tablet Take 0.5-1 tablets (0.5-1 mg total) by mouth at bedtime.   furosemide (LASIX) 20 MG tablet Take 1 tablet (20 mg total) by mouth daily.    levothyroxine (SYNTHROID, LEVOTHROID) 100 MCG tablet Take 100 mcg by mouth daily before breakfast.    losartan (COZAAR) 50 MG tablet Take 1 tablet (50 mg total) by mouth as directed. Take HALF tablet (25 mg) for one week then increase to whole tablet (50 mg) if BP remains high.   nitroGLYCERIN (NITROSTAT) 0.4 MG SL tablet Place 1 tablet (0.4 mg total) under the tongue every 5 (five) minutes as needed for chest pain.   traZODone (DESYREL) 50 MG tablet Take 0.5 tablets (25 mg total) by mouth at bedtime.    Allergies:   Sulfa antibiotics and Latex   Social History:  The patient  reports that she has never smoked. She has never used smokeless tobacco. She reports that she does not drink alcohol or use drugs.   Family History:  The patient's family history includes Depression in her sister, sister, and sister; Hypertension in her mother and sister; Lung cancer in her sister; Stroke in her sister.  ROS:   Review of Systems  Constitutional: Positive for malaise/fatigue. Negative for chills, diaphoresis, fever and weight loss.  HENT: Negative for congestion.   Eyes: Negative for discharge and redness.  Respiratory: Negative for cough, hemoptysis, sputum production, shortness of breath and wheezing.   Cardiovascular: Positive for chest pain and leg swelling. Negative for palpitations, orthopnea, claudication and PND.       Isolated episode of chest pain occurring approximately 4 weeks prior  Gastrointestinal: Negative for abdominal pain, blood in stool, heartburn, melena, nausea and vomiting.  Genitourinary: Positive for frequency and urgency. Negative for hematuria.  Musculoskeletal: Negative for falls and myalgias.  Skin: Positive for rash.       Erythematous rash noted underneath the right breast and left axilla  Neurological: Positive for weakness. Negative for dizziness, tingling, tremors, sensory change, speech change, focal weakness and loss of consciousness.  Endo/Heme/Allergies: Does not  bruise/bleed easily.  Psychiatric/Behavioral: Negative for depression, substance abuse and suicidal ideas. The patient is not nervous/anxious.   All other systems reviewed and are negative.    PHYSICAL EXAM:  VS:  BP 138/60 (BP Location: Right Arm, Patient Position: Sitting, Cuff Size: Normal)    Ht 5\' 3"  (1.6 m)    Wt 200 lb 6 oz (90.9 kg)    BMI 35.49 kg/m  BMI: Body mass index is 35.49 kg/m.  Physical Exam  Constitutional: She is oriented to person, place, and time. She appears well-developed and well-nourished.  HENT:  Head: Normocephalic and atraumatic.  Eyes: Right eye exhibits no discharge. Left eye exhibits no discharge.  Neck: Normal range of motion. No JVD present.  Cardiovascular: Normal rate, regular rhythm, S1 normal, S2 normal and normal heart sounds. Exam reveals no distant heart sounds, no friction rub, no midsystolic click and no opening snap.  No murmur heard. Pulmonary/Chest: Effort normal and breath sounds normal. No respiratory distress. She has no decreased breath sounds. She has no wheezes. She has no rales. She exhibits no tenderness.  Abdominal: Soft. She exhibits no distension. There is no abdominal tenderness.  Musculoskeletal:        General: Edema present.     Comments: Trace to 1+ bilateral pretibial edema  Neurological: She is alert and oriented to person, place, and time.  Skin: Skin is warm and dry. Rash noted. No cyanosis. Nails show no clubbing.  Erythematous rash noted underneath the right breast tissue as well as left axilla  Psychiatric: She has a normal mood and affect. Her speech is normal and behavior is normal. Judgment and thought content normal.     EKG:  Was not ordered today.  Recent Labs: 10/24/2018: ALT 11; B Natriuretic Peptide 211.0; Magnesium 1.9 10/29/2018: Hemoglobin 8.5; Platelets 227 12/11/2018: BUN 24; Creatinine, Ser 0.95; Potassium 4.4; Sodium 133  No results found for requested labs within last 8760 hours.   CrCl cannot be  calculated (Patient's most recent lab result is older than the maximum 21 days allowed.).   Wt Readings from Last 3 Encounters:  05/01/19 200 lb 6 oz (90.9 kg)  03/02/19 199 lb (90.3 kg)  11/20/18 199 lb (90.3 kg)     Other studies reviewed: Additional studies/records reviewed today include: summarized above  ASSESSMENT AND PLAN:  1. Lower extremity swelling: Patient with known history of chronic venous insufficiency that is felt to been previously exacerbated by her underlying anemia with most recent hemoglobin low at 8.5 from 10/2018 as well as hypoalbuminemia and obesity.  She does not wear compression stockings or elevate her legs when sitting.  I cannot exclude a small component of diastolic heart failure though suspect the majority of her swelling is related to venous insufficiency exacerbated by the above.  I will check a CMP and CBC to trend her albumin and hemoglobin.  At this time given that her lungs are clear to auscultation bilaterally I do not recommend continued escalation of her Lasix.  I do recommend she elevate her legs and wear compression stockings as previously directed.  2. Chronic HFpEF: Weight is stable.  She appears euvolemic and well compensated.  I do have concerns that she may have developed some acute on CKD in the setting of recent escalation of Lasix with her lower extremity swelling.  We are checking renal function as outlined above.  She has already taken 40 mg of Lasix this morning without KCl repletion.  Await labs checked today for further recommendations regarding diuretic therapy as well as potassium repletion.  3. CAD involving the native coronary arteries with stable angina: Currently symptom-free.  She does indicate an isolated episode of chest pain approximately 4 weeks prior which resolved with sublingual glycerin x1.  I did offer her a stress test which she declined and states that she will continue to monitor her pain and let us know if it returns.  She  will continue aspirin and Lipitor.  She prefers to avoid metoprolol for now.  4. Aortic valve stenosis status post TAVR: Most recent echo outlined as above.  Follow-up with structural heart clinic as directed.  5. Complete heart block: Status post PPM.  Followed by EP.  6. Hypertension: Blood pressure is  reasonably controlled today.  Continue current dose of losartan 25 mg daily as well as Lasix 20 mg daily pending renal function as outlined above.  May need to resume metoprolol.  7. Hyperlipidemia: She has subsequently restarted Lipitor as previously directed.  At her next visit consider fasting lipid panel.  8. Rash: Concerning for fungal/yeast infection in the setting of poorly controlled diabetes.  I have advised her I do not have microscope slides for skin scraping for microscope to evaluate for hyphae.  I have advised her to contact her PCP today for further evaluation.  I have instructed her to not apply cortisone cream to her rash as this may worsen it.  9. Diabetes: It appears she is no longer taking any medications for this with recent blood sugars being poorly controlled.  I have advised her to follow-up with her PCP.  10. Obesity: Weight loss is advised.  11. Anemia: Likely exacerbating lower extremity swelling and potentially playing a role and her intermittent chest pain.  Check CBC as above.  I recommend she follow-up with her PCP.  Disposition: F/u with Dr. Rockey Situ or an APP in 3 months.  Current medicines are reviewed at length with the patient today.  The patient did not have any concerns regarding medicines.  Signed, Christell Faith, PA-C 05/01/2019 9:54 AM     [facility] [address] [place], [place] [address] [phone]

## 2019-05-01 NOTE — Telephone Encounter (Signed)
-----   Message from Rise Mu, PA-C sent at 05/01/2019 11:59 AM EDT ----- Please call patient labs showed mildly elevated serum creatinine though at her approximate baseline.  Potassium is slightly low at 3.4.  Liver function is normal.  Glucose is poorly controlled with a value of 237.  Blood count is improved with a current value of 11.7 which is at her approximate baseline.  -Continue current dose of Lasix 20 mg daily with an extra 20 mg dose for weight greater than 202 pounds or worsening lower extremity swelling/shortness of breath -Please send in KCl 10 mEq daily with an extra 10 mEq when she takes an extra dose of Lasix -Patient needs to follow-up with PCP regarding poorly controlled diabetes and rash as discussed at her office visit

## 2019-05-01 NOTE — Telephone Encounter (Signed)
Spoke with the pt and mad her aware of Ryan's recommendation. Rx sent ot the pt pharmacy for Potassium 17mEq daily. Pt will f/u with her pcp as planned on 05/04/19. Pt verbalized understanding and voiced appreciation for the call.

## 2019-05-05 ENCOUNTER — Encounter: Payer: Self-pay | Admitting: Physician Assistant

## 2019-05-13 ENCOUNTER — Telehealth (HOSPITAL_COMMUNITY): Payer: Self-pay

## 2019-05-13 ENCOUNTER — Other Ambulatory Visit (HOSPITAL_COMMUNITY): Payer: Self-pay

## 2019-05-13 DIAGNOSIS — F331 Major depressive disorder, recurrent, moderate: Secondary | ICD-10-CM

## 2019-05-13 NOTE — Telephone Encounter (Signed)
Patients daughter called, patient is scheduled for tomorrow. Patient has been out of Klonopin for the last 4 days, she had takes a little extra due to family stress. Patients daughter is concerned and states that her mother is not the same. She wanted you to know before the appointment that patient may need an increase in medication.

## 2019-05-14 ENCOUNTER — Other Ambulatory Visit: Payer: Self-pay

## 2019-05-14 ENCOUNTER — Ambulatory Visit (INDEPENDENT_AMBULATORY_CARE_PROVIDER_SITE_OTHER): Payer: Medicare Other | Admitting: Psychiatry

## 2019-05-14 ENCOUNTER — Encounter (HOSPITAL_COMMUNITY): Payer: Self-pay | Admitting: Psychiatry

## 2019-05-14 DIAGNOSIS — F411 Generalized anxiety disorder: Secondary | ICD-10-CM

## 2019-05-14 DIAGNOSIS — F331 Major depressive disorder, recurrent, moderate: Secondary | ICD-10-CM | POA: Diagnosis not present

## 2019-05-14 MED ORDER — TRAZODONE HCL 50 MG PO TABS: 25.0000 mg | ORAL_TABLET | Freq: Every day | ORAL | 1 refills | Status: DC

## 2019-05-14 MED ORDER — PAROXETINE HCL 20 MG PO TABS: 20.0000 mg | ORAL_TABLET | Freq: Every day | ORAL | 1 refills | Status: DC

## 2019-05-14 MED ORDER — CLONAZEPAM 1 MG PO TABS: 0.5000 mg | ORAL_TABLET | Freq: Every day | ORAL | 1 refills | Status: DC
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
## 2019-06-05 ENCOUNTER — Other Ambulatory Visit (HOSPITAL_COMMUNITY): Payer: Self-pay | Admitting: Psychiatry

## 2019-06-05 DIAGNOSIS — F331 Major depressive disorder, recurrent, moderate: Secondary | ICD-10-CM

## 2019-06-05 DIAGNOSIS — F411 Generalized anxiety disorder: Secondary | ICD-10-CM

## 2019-06-06 ENCOUNTER — Other Ambulatory Visit: Payer: Self-pay

## 2019-06-06 ENCOUNTER — Emergency Department
Admission: EM | Admit: 2019-06-06 | Discharge: 2019-06-06 | Disposition: A | Discharge status: Home / Self Care | Payer: Medicare Other | Attending: Emergency Medicine | Admitting: Emergency Medicine

## 2019-06-06 ENCOUNTER — Emergency Department: Payer: Medicare Other

## 2019-06-06 DIAGNOSIS — Z950 Presence of cardiac pacemaker: Secondary | ICD-10-CM | POA: Diagnosis not present

## 2019-06-06 DIAGNOSIS — E119 Type 2 diabetes mellitus without complications: Secondary | ICD-10-CM | POA: Insufficient documentation

## 2019-06-06 DIAGNOSIS — R42 Dizziness and giddiness: Secondary | ICD-10-CM | POA: Diagnosis not present

## 2019-06-06 DIAGNOSIS — R51 Headache: Secondary | ICD-10-CM | POA: Diagnosis not present

## 2019-06-06 DIAGNOSIS — R110 Nausea: Secondary | ICD-10-CM | POA: Diagnosis not present

## 2019-06-06 DIAGNOSIS — I259 Chronic ischemic heart disease, unspecified: Secondary | ICD-10-CM | POA: Diagnosis not present

## 2019-06-06 DIAGNOSIS — Z79899 Other long term (current) drug therapy: Secondary | ICD-10-CM | POA: Diagnosis not present

## 2019-06-06 DIAGNOSIS — I110 Hypertensive heart disease with heart failure: Secondary | ICD-10-CM | POA: Diagnosis not present

## 2019-06-06 DIAGNOSIS — Z7982 Long term (current) use of aspirin: Secondary | ICD-10-CM | POA: Insufficient documentation

## 2019-06-06 DIAGNOSIS — I5032 Chronic diastolic (congestive) heart failure: Secondary | ICD-10-CM | POA: Insufficient documentation

## 2019-06-06 DIAGNOSIS — E039 Hypothyroidism, unspecified: Secondary | ICD-10-CM | POA: Insufficient documentation

## 2019-06-06 DIAGNOSIS — R030 Elevated blood-pressure reading, without diagnosis of hypertension: Secondary | ICD-10-CM

## 2019-06-06 DIAGNOSIS — R519 Headache, unspecified: Secondary | ICD-10-CM

## 2019-06-06 LAB — COMPREHENSIVE METABOLIC PANEL
ALT: 30 U/L (ref 0–44)
AST: 35 U/L (ref 15–41)
Albumin: 3.6 g/dL (ref 3.5–5.0)
Alkaline Phosphatase: 62 U/L (ref 38–126)
Anion gap: 10 (ref 5–15)
BUN: 20 mg/dL (ref 8–23)
CO2: 24 mmol/L (ref 22–32)
Calcium: 9.3 mg/dL (ref 8.9–10.3)
Chloride: 104 mmol/L (ref 98–111)
Creatinine, Ser: 1.08 mg/dL — ABNORMAL HIGH (ref 0.44–1.00)
GFR calc Af Amer: 57 mL/min — ABNORMAL LOW (ref >60)
GFR calc non Af Amer: 49 mL/min — ABNORMAL LOW (ref >60)
Glucose, Bld: 117 mg/dL — ABNORMAL HIGH (ref 70–99)
Potassium: 4 mmol/L (ref 3.5–5.1)
Sodium: 138 mmol/L (ref 135–145)
Total Bilirubin: 0.4 mg/dL (ref 0.3–1.2)
Total Protein: 6.8 g/dL (ref 6.5–8.1)

## 2019-06-06 LAB — CBC
HCT: 33.6 % — ABNORMAL LOW (ref 36.0–46.0)
Hemoglobin: 10.3 g/dL — ABNORMAL LOW (ref 12.0–15.0)
MCH: 27.2 pg (ref 26.0–34.0)
MCHC: 30.7 g/dL (ref 30.0–36.0)
MCV: 88.9 fL (ref 80.0–100.0)
Platelets: 259 10*3/uL (ref 150–400)
RBC: 3.78 MIL/uL — ABNORMAL LOW (ref 3.87–5.11)
RDW: 14.6 % (ref 11.5–15.5)
WBC: 9.2 10*3/uL (ref 4.0–10.5)
nRBC: 0 % (ref 0.0–0.2)

## 2019-06-06 LAB — URINALYSIS, COMPLETE (UACMP) WITH MICROSCOPIC
Bilirubin Urine: NEGATIVE
Glucose, UA: NEGATIVE mg/dL
Hgb urine dipstick: NEGATIVE
Ketones, ur: NEGATIVE mg/dL
Leukocytes,Ua: NEGATIVE
Nitrite: NEGATIVE
Protein, ur: NEGATIVE mg/dL
Specific Gravity, Urine: 1.005 (ref 1.005–1.030)
WBC, UA: NONE SEEN WBC/hpf (ref 0–5)
pH: 6 (ref 5.0–8.0)

## 2019-06-06 LAB — TROPONIN I (HIGH SENSITIVITY)
Troponin I (High Sensitivity): 18 ng/L — ABNORMAL HIGH (ref <18)
Troponin I (High Sensitivity): 18 ng/L — ABNORMAL HIGH (ref <18)

## 2019-06-06 MED ORDER — ACETAMINOPHEN 500 MG PO TABS
1000.0000 mg | ORAL_TABLET | Freq: Once | ORAL | Status: AC
  Administered 2019-06-06: 05:00:00 1000 mg via ORAL
  Filled 2019-06-06: qty 2

## 2019-06-06 MED ORDER — LOSARTAN POTASSIUM 50 MG PO TABS
25.0000 mg | ORAL_TABLET | Freq: Once | ORAL | Status: AC
  Administered 2019-06-06: 25 mg via ORAL
  Filled 2019-06-06: qty 1

## 2019-06-06 MED ORDER — PAROXETINE HCL 20 MG PO TABS
20.0000 mg | ORAL_TABLET | Freq: Once | ORAL | Status: AC
  Administered 2019-06-06: 06:00:00 20 mg via ORAL
  Filled 2019-06-06: qty 1

## 2019-06-06 MED ORDER — PROCHLORPERAZINE EDISYLATE 10 MG/2ML IJ SOLN
5.0000 mg | Freq: Once | INTRAMUSCULAR | Status: AC
  Administered 2019-06-06: 05:00:00 5 mg via INTRAVENOUS
  Filled 2019-06-06: qty 2

## 2019-06-06 NOTE — ED Triage Notes (Signed)
Reports woke around 2 am not feeling well and checked blood pressure and it was 209/100.  Also reports increased swelling in lower legs over the past 2 months.

## 2019-06-06 NOTE — ED Notes (Signed)
Spoke with rep from [employer] re: pacemaker, pt is in v-paced rhythm, no arrhythmias, has 11 years left on battery, pt needs to have pacemaker manually checked in office-has not been done in 2 years. Informed Dr. Joni Fears.

## 2019-06-06 NOTE — ED Provider Notes (Signed)
[facility] Emergency Department Provider Note  ____________________________________________  Time seen: Approximately 6:27 AM  I have reviewed the triage vital signs and the nursing notes.   HISTORY  Chief Complaint Hypertension   HPI Catherine [name] is a 78 y.o. female with history of anemia, CAD, complete heart block status post pacemaker, diastolic CHF, venous stasis, aortic valve stenosis status post replacement, hypertension, diabetes who presents for evaluation of elevated blood pressure and dizziness.  Patient reports that she had a very exhausting day yesterday with several family members visiting her.  She went to bed early because she was very tired.  She woke up at 2 AM to go to the bathroom.  She reports that when she woke up she had a headache located on the left side of her head, moderate in intensity and sharp.  She also felt dizzy which she describes as feeling lightheaded like she was going to pass out.  She checks her blood pressure which was 209/100.  She endorses compliance with her medications.  She denies vertigo.  She also reports feeling nauseous.  No vomiting, no diarrhea, no chest pain, no shortness of breath, no abdominal pain, no cough, no fever, no dysuria.  She continues to have persistent headache and feeling lightheaded.  No facial droop, slurred speech, unilateral weakness or numbness, history of stroke.  Past Medical History:  Diagnosis Date  . Acute colitis   . Anemia   . CAD (coronary artery disease)    a. heavy calcification of the entire LAD & mod eccentric mid LAD stenosis, estimated @ 70%, mild nonobs stenosis of RCA and LCx, severely calcified Ao valve with restricted valve mobility and 2+ AI    . Cancer [facility]) 1981   breast  . CHB (complete heart block) ([place]) 08/31/2015   [place] DR model QQ2297 (serial number [id number] )   . Chronic diastolic congestive heart failure ([place] )    a. echo 08/31/2015: EF  65-70%, nl WM, GR1DD, Ao valve stent bioprosthesis was present and functioning nl, no regurg, LA mildly dilated, PASP 46 mm Hg  . Collagen vascular disease ([place])   . Colon polyps   . Depression   . Diabetes mellitus type II   . Fibromyalgia   . HTN (hypertension)   . Hypercholesteremia   . Hypothyroidism   . IBS (irritable bowel syndrome)   . Rectal bleeding   . Rheumatoid arthritis(714.0)   . S/P TAVR (transcatheter aortic valve replacement) 08/30/2015   26 mm Edwards Sapien 3 transcatheter heart valve placed via open right transfemoral approach  . Shortness of breath dyspnea   . Stenosis of aortic valve   . Thyroid disease     Patient Active Problem List   Diagnosis Date Noted  . Polypharmacy 03/12/2019  . Generalized weakness   . Shortness of breath   . Near syncope 10/24/2018  . Thyroid disease   . Aortic valve stenosis   . Rectal bleeding   . Hyperlipidemia   . Hypertension   . Depression   . Colon polyps   . Collagen vascular disease ([place])   . CAD (coronary artery disease)   . Anemia   . Acute colitis   . Abdominal pain   . BPPV (benign paroxysmal positional vertigo), bilateral   . CVA (cerebral vascular accident) ([place])   . TIA (transient ischemic attack) 03/09/2017  . Dizziness   . Prolonged Q-T interval on ECG 03/13/2016  . Suicidal ideation 03/06/2016  . Severe  recurrent major depression without psychotic features ([place])   . Sleep disturbance 02/28/2016  . Chest pain 09/24/2015  . Hypokalemia 09/07/2015  . IBS (irritable bowel syndrome)   . Cancer ([place])   . Pneumothorax 09/05/2015  . Complete heart block ([place])   . CHB (complete heart block) ([place]) 08/31/2015  . Status post transcatheter aortic valve replacement (TAVR) using bioprosthesis 08/30/2015  . Type II diabetes mellitus ([place])   . Essential hypertension   . Chronic diastolic congestive heart failure ([place])   . Rheumatoid arthritis ([place])   . Hypothyroidism   . Fibromyalgia   . Aortic valve  stenosis, critical 08/22/2015  . MDD (major depressive disorder) ([place]) 05/26/2012    Past Surgical History:  Procedure Laterality Date  . ABDOMINAL SURGERY     Intestinal surgery  . CARDIAC SURGERY    . CESAREAN SECTION     x 2  . EP IMPLANTABLE DEVICE N/A 08/31/2015   Procedure: Pacemaker Implant;  Surgeon: Will Meredith Leeds, MD; [place] (serial number [id number] ); Laterality: Right  . LEFT OOPHORECTOMY    . MASTECTOMY Left   . polyp     . self inflicted chest wound    . [place]   lower back  . TEE WITHOUT CARDIOVERSION N/A 08/30/2015   Procedure: TRANSESOPHAGEAL ECHOCARDIOGRAM (TEE);  Surgeon: Sherren Mocha, MD;  Location: [facility];  Service: Open Heart Surgery;  Laterality: N/A;  . TOTAL ABDOMINAL HYSTERECTOMY    . TRANSCATHETER AORTIC VALVE REPLACEMENT, TRANSFEMORAL N/A 08/30/2015   Procedure: TRANSCATHETER AORTIC VALVE REPLACEMENT, TRANSFEMORAL;  Surgeon: Sherren Mocha, MD;  Location: [facility];  Service: Open Heart Surgery;  Laterality: N/A;    Prior to Admission medications   Medication Sig Start Date End Date Taking? Authorizing Provider  aspirin EC 81 MG tablet Take 81 mg by mouth daily.    [provider]  atorvastatin (LIPITOR) 40 MG tablet Take 1 tablet (40 mg total) by mouth daily. 03/02/19   Minna Merritts, MD  budesonide (PULMICORT) 0.5 MG/2ML nebulizer solution Take 2 mLs (0.5 mg total) by nebulization 2 (two) times daily. 10/29/18 11/28/18  Mayo, Pete Pelt, MD  clonazePAM (KLONOPIN) 1 MG tablet Take 0.5-1 tablets (0.5-1 mg total) by mouth at bedtime. 05/14/19   Arfeen, Arlyce Harman, MD  furosemide (LASIX) 20 MG tablet Take 1 tablet (20 mg total) by mouth daily. 04/28/19 07/27/19  Minna Merritts, MD  levothyroxine (SYNTHROID, LEVOTHROID) 100 MCG tablet Take 100 mcg by mouth daily before breakfast.     [provider]  losartan (COZAAR) 50 MG tablet Take 1 tablet (50 mg total) by mouth as directed. Take HALF tablet  (25 mg) for one week then increase to whole tablet (50 mg) if BP remains high. 03/12/19 06/10/19  Minna Merritts, MD  nitroGLYCERIN (NITROSTAT) 0.4 MG SL tablet Place 1 tablet (0.4 mg total) under the tongue every 5 (five) minutes as needed for chest pain. 03/02/19   Minna Merritts, MD  PARoxetine (PAXIL) 20 MG tablet Take 1 tablet (20 mg total) by mouth daily. 05/14/19 05/13/20  Arfeen, Arlyce Harman, MD  potassium chloride (K-DUR) 10 MEQ tablet Take 1 tablet (10 mEq total) by mouth daily. Take an extra 73mEq tablet on the days you take an extra Lasix. 05/01/19 07/30/19  Rise Mu, PA-C  traZODone (DESYREL) 50 MG tablet Take 0.5 tablets (25 mg total) by mouth at bedtime. 05/14/19   Arfeen, Arlyce Harman, MD  Allergies Sulfa antibiotics and Latex  Family History  Problem Relation Age of Onset  . Depression Sister   . Depression Sister   . Depression Sister   . Hypertension Mother   . Lung cancer Sister   . Stroke Sister   . Hypertension Sister   . Heart attack Neg Hx     Social History Social History   Tobacco Use  . Smoking status: Never Smoker  . Smokeless tobacco: Never Used  Substance Use Topics  . Alcohol use: No    Alcohol/week: 0.0 standard drinks  . Drug use: No    Review of Systems  Constitutional: Negative for fever. + Lightheadedness  eyes: Negative for visual changes. ENT: Negative for sore throat. Neck: No neck pain  Cardiovascular: Negative for chest pain. Respiratory: Negative for shortness of breath. Gastrointestinal: Negative for abdominal pain, vomiting or diarrhea. + Nausea Genitourinary: Negative for dysuria. Musculoskeletal: Negative for back pain. Skin: Negative for rash. Neurological: Negative for  weakness or numbness. + HA Psych: No SI or HI  ____________________________________________   PHYSICAL EXAM:  VITAL SIGNS: ED Triage Vitals  Enc Vitals Group     BP 06/06/19 0448 (!) 149/59     Pulse Rate 06/06/19 0448 77     Resp 06/06/19 0448 18      Temp 06/06/19 0448 97.8 F (36.6 C)     Temp Source 06/06/19 0448 Oral     SpO2 06/06/19 0448 98 %     Weight 06/06/19 0447 198 lb (89.8 kg)     Height 06/06/19 0447 5\' 3"  (1.6 m)     Head Circumference --      Peak Flow --      Pain Score 06/06/19 0447 0     Pain Loc --      Pain Edu? --      Excl. in [place]? --     Constitutional: Alert and oriented. Well appearing and in no apparent distress. HEENT:      Head: Normocephalic and atraumatic.         Eyes: Conjunctivae are normal. Sclera is non-icteric.       Mouth/Throat: Mucous membranes are moist.       Neck: Supple with no signs of meningismus. Cardiovascular: Regular rate and rhythm. No murmurs, gallops, or rubs. 2+ symmetrical distal pulses are present in all extremities. No JVD. Respiratory: Normal respiratory effort. Lungs are clear to auscultation bilaterally. No wheezes, crackles, or rhonchi.  Gastrointestinal: Soft, non tender, and non distended with positive bowel sounds. No rebound or guarding. Musculoskeletal: Nontender with normal range of motion in all extremities. No edema, cyanosis, or erythema of extremities. Neurologic: Normal speech and language. Face is symmetric.  Intact strength and sensation x4, no pronator drift, no dysmetria Skin: Skin is warm, dry and intact. No rash noted. Psychiatric: Mood and affect are normal. Speech and behavior are normal.  ____________________________________________   LABS (all labs ordered are listed, but only abnormal results are displayed)  Labs Reviewed  CBC - Abnormal; Notable for the following components:      Result Value   RBC 3.78 (*)    Hemoglobin 10.3 (*)    HCT 33.6 (*)    All other components within normal limits  COMPREHENSIVE METABOLIC PANEL - Abnormal; Notable for the following components:   Glucose, Bld 117 (*)    Creatinine, Ser 1.08 (*)    GFR calc non Af Amer 49 (*)    GFR calc Af Amer 57 (*)  All other components within normal limits  TROPONIN I  (HIGH SENSITIVITY) - Abnormal; Notable for the following components:   Troponin I (High Sensitivity) 18 (*)    All other components within normal limits  URINALYSIS, COMPLETE (UACMP) WITH MICROSCOPIC - Abnormal; Notable for the following components:   Color, Urine STRAW (*)    APPearance CLEAR (*)    Bacteria, UA RARE (*)    All other components within normal limits  TROPONIN I (HIGH SENSITIVITY)   ____________________________________________  EKG  ED ECG REPORT I, Rudene Re, the attending physician, personally viewed and interpreted this ECG.  Atrial sensed ventricular paced rhythm, rate of 78, no concordant ST elevation ____________________________________________  RADIOLOGY  I have personally reviewed the images performed during this visit and I agree with the Radiologist's read.   Interpretation by Radiologist:  Ct Head Wo Contrast  Result Date: 06/06/2019 CLINICAL DATA:  78 y/o  F; headache and hypertension. EXAM: CT HEAD WITHOUT CONTRAST TECHNIQUE: Contiguous axial images were obtained from the base of the skull through the vertex without intravenous contrast. COMPARISON:  10/23/2018 CT head FINDINGS: Brain: No evidence of acute infarction, hemorrhage, hydrocephalus, extra-axial collection or mass lesion/mass effect. Stable very small chronic infarction in the caudate head. Stable nonspecific white matter hypodensities compatible with chronic microvascular ischemic changes. Stable volume loss of the brain. Vascular: Calcific atherosclerosis of the internal carotid arteries. No hyperdense vessel. Skull: Normal. Negative for fracture or focal lesion. Sinuses/Orbits: Partial opacification of ethmoid air cells. Additional included paranasal sinuses and the mastoid air cells are normally aerated. Other: None. IMPRESSION: 1. No acute intracranial abnormality identified. 2. Stable chronic microvascular ischemic changes and parenchymal volume loss of the brain. 3. Mild ethmoid sinus  disease. Electronically Signed   By: Kristine Garbe M.D.   On: 06/06/2019 06:15     ____________________________________________   PROCEDURES  Procedure(s) performed: None Procedures Critical Care performed:  None ____________________________________________   INITIAL IMPRESSION / ASSESSMENT AND PLAN / ED COURSE  78 y.o. female with history of anemia, CAD, complete heart block status post pacemaker, diastolic CHF, venous stasis, aortic valve stenosis status post replacement, hypertension, diabetes who presents for evaluation of elevated blood pressure, HA, nausea, and lightheadedness.  Patient is extremely well-appearing, neurologically intact, blood pressure is within normal range in the emergency room.  EKG showing paced rhythm with no ischemia or dysrhythmias.  Head CT negative for any intracranial abnormality.  No clinical signs of stroke. Patient received Tylenol and Compazine for her headache which has fully resolved.  She reports resolution of her dizziness as well.  Her morning dose of lisinopril has been given.  She also requested her morning dose of Paxil.  Her labs show stable mild anemia, no signs of acute kidney injury, electrolyte abnormalities, or significant dehydration.  High sensitive troponin is borderline at 18.  Will get a second 1.  Interrogation of pacemaker has been done, results are pending.  Urinalysis is pending to rule out UTI.       As part of my medical decision making, I reviewed the following data within the [place] notes reviewed and incorporated, Labs reviewed , EKG interpreted , Old chart reviewed, Radiograph reviewed , Notes from prior ED visits and [place] Controlled Substance Database    Pertinent labs & imaging results that were available during my care of the patient were reviewed by me and considered in my medical decision making (see chart for details).    ____________________________________________   FINAL  CLINICAL IMPRESSION(S) / ED DIAGNOSES  Final diagnoses:  Elevated blood pressure reading  Dizziness  Nausea  Acute nonintractable headache, unspecified headache type      NEW MEDICATIONS STARTED DURING THIS VISIT:  ED Discharge Orders    None       Note:  This document was prepared using Dragon voice recognition software and may include unintentional dictation errors.    Alfred Levins, [place], MD 06/06/19 [phone]

## 2019-06-06 NOTE — ED Notes (Signed)
Pt ambulated to bathroom and into hallway with walker unassisted by staff, denies any pain or shortness of breath, reports feeling better.

## 2019-06-06 NOTE — Discharge Instructions (Addendum)
Your lab tests and pacemaker interrogation today were okay. The pacemaker company recommends having your pacemaker checked in your cardiology office soon.  Be careful to avoid hot weather and hydrate as needed.

## 2019-06-06 NOTE — ED Notes (Signed)
Pt given crackers and drink, waiting for family to pick her up.

## 2019-06-06 NOTE — ED Provider Notes (Signed)
-----------------------------------------   7:50 AM on 06/06/2019 -----------------------------------------   Second troponin unchanged.  Pacemaker was interrogated and showed no abnormal events or other concerns.  They do recommend that she have it rechecked in her cardiology office which I have relayed to the patient.  She ambulates with a steady gait, asymptomatic.  Stable for discharge per Dr. Loman [place] plan.   Carrie Mew, MD 06/06/19 [phone]

## 2019-06-15 ENCOUNTER — Emergency Department
Admission: EM | Admit: 2019-06-15 | Discharge: 2019-06-15 | Disposition: A | Discharge status: Home / Self Care | Payer: Medicare Other | Attending: Emergency Medicine | Admitting: Emergency Medicine

## 2019-06-15 ENCOUNTER — Emergency Department: Payer: Medicare Other

## 2019-06-15 ENCOUNTER — Encounter: Payer: Self-pay | Admitting: Emergency Medicine

## 2019-06-15 ENCOUNTER — Other Ambulatory Visit: Payer: Self-pay

## 2019-06-15 DIAGNOSIS — Z79899 Other long term (current) drug therapy: Secondary | ICD-10-CM | POA: Diagnosis not present

## 2019-06-15 DIAGNOSIS — I2510 Atherosclerotic heart disease of native coronary artery without angina pectoris: Secondary | ICD-10-CM | POA: Insufficient documentation

## 2019-06-15 DIAGNOSIS — I110 Hypertensive heart disease with heart failure: Secondary | ICD-10-CM | POA: Diagnosis not present

## 2019-06-15 DIAGNOSIS — Z853 Personal history of malignant neoplasm of breast: Secondary | ICD-10-CM | POA: Insufficient documentation

## 2019-06-15 DIAGNOSIS — Z91040 Latex allergy status: Secondary | ICD-10-CM | POA: Insufficient documentation

## 2019-06-15 DIAGNOSIS — Z7982 Long term (current) use of aspirin: Secondary | ICD-10-CM | POA: Diagnosis not present

## 2019-06-15 DIAGNOSIS — R109 Unspecified abdominal pain: Secondary | ICD-10-CM

## 2019-06-15 DIAGNOSIS — E119 Type 2 diabetes mellitus without complications: Secondary | ICD-10-CM | POA: Diagnosis not present

## 2019-06-15 DIAGNOSIS — E039 Hypothyroidism, unspecified: Secondary | ICD-10-CM | POA: Diagnosis not present

## 2019-06-15 DIAGNOSIS — I5032 Chronic diastolic (congestive) heart failure: Secondary | ICD-10-CM | POA: Insufficient documentation

## 2019-06-15 DIAGNOSIS — Z8673 Personal history of transient ischemic attack (TIA), and cerebral infarction without residual deficits: Secondary | ICD-10-CM | POA: Diagnosis not present

## 2019-06-15 DIAGNOSIS — R1031 Right lower quadrant pain: Secondary | ICD-10-CM | POA: Insufficient documentation

## 2019-06-15 LAB — COMPREHENSIVE METABOLIC PANEL
ALT: 18 U/L (ref 0–44)
AST: 24 U/L (ref 15–41)
Albumin: 4 g/dL (ref 3.5–5.0)
Alkaline Phosphatase: 77 U/L (ref 38–126)
Anion gap: 10 (ref 5–15)
BUN: 19 mg/dL (ref 8–23)
CO2: 24 mmol/L (ref 22–32)
Calcium: 9.4 mg/dL (ref 8.9–10.3)
Chloride: 101 mmol/L (ref 98–111)
Creatinine, Ser: 0.94 mg/dL (ref 0.44–1.00)
GFR calc Af Amer: >60 mL/min (ref >60)
GFR calc non Af Amer: 58 mL/min — ABNORMAL LOW (ref >60)
Glucose, Bld: 136 mg/dL — ABNORMAL HIGH (ref 70–99)
Potassium: 3.5 mmol/L (ref 3.5–5.1)
Sodium: 135 mmol/L (ref 135–145)
Total Bilirubin: 0.7 mg/dL (ref 0.3–1.2)
Total Protein: 7.5 g/dL (ref 6.5–8.1)

## 2019-06-15 LAB — CBC
HCT: 35.8 % — ABNORMAL LOW (ref 36.0–46.0)
Hemoglobin: 10.9 g/dL — ABNORMAL LOW (ref 12.0–15.0)
MCH: 27.4 pg (ref 26.0–34.0)
MCHC: 30.4 g/dL (ref 30.0–36.0)
MCV: 89.9 fL (ref 80.0–100.0)
Platelets: 312 10*3/uL (ref 150–400)
RBC: 3.98 MIL/uL (ref 3.87–5.11)
RDW: 14.6 % (ref 11.5–15.5)
WBC: 9.5 10*3/uL (ref 4.0–10.5)
nRBC: 0 % (ref 0.0–0.2)

## 2019-06-15 LAB — LIPASE, BLOOD: Lipase: 33 U/L (ref 11–51)

## 2019-06-15 LAB — URINALYSIS, COMPLETE (UACMP) WITH MICROSCOPIC
Bilirubin Urine: NEGATIVE
Glucose, UA: NEGATIVE mg/dL
Hgb urine dipstick: NEGATIVE
Ketones, ur: NEGATIVE mg/dL
Leukocytes,Ua: NEGATIVE
Nitrite: NEGATIVE
Protein, ur: NEGATIVE mg/dL
Specific Gravity, Urine: 1.005 (ref 1.005–1.030)
WBC, UA: NONE SEEN WBC/hpf (ref 0–5)
pH: 6 (ref 5.0–8.0)

## 2019-06-15 MED ORDER — MORPHINE SULFATE (PF) 2 MG/ML IV SOLN
INTRAVENOUS | Status: AC
  Administered 2019-06-15: 17:00:00 2 mg via INTRAMUSCULAR
  Filled 2019-06-15: qty 1

## 2019-06-15 MED ORDER — POLYETHYLENE GLYCOL 3350 17 GM/SCOOP PO POWD: 17.0000 g | Freq: Every day | ORAL | 0 refills | Status: DC | PRN

## 2019-06-15 MED ORDER — MORPHINE SULFATE (PF) 2 MG/ML IV SOLN: 2.0000 mg | Freq: Once | INTRAVENOUS | Status: DC

## 2019-06-15 MED ORDER — IOHEXOL 240 MG/ML SOLN
50.0000 mL | Freq: Once | INTRAMUSCULAR | Status: AC | PRN
  Administered 2019-06-15: 18:00:00 50 mL via ORAL

## 2019-06-15 MED ORDER — IOHEXOL 300 MG/ML  SOLN
100.0000 mL | Freq: Once | INTRAMUSCULAR | Status: AC | PRN
  Administered 2019-06-15: 20:00:00 100 mL via INTRAVENOUS

## 2019-06-15 MED ORDER — ONDANSETRON HCL 4 MG/2ML IJ SOLN
INTRAMUSCULAR | Status: AC
  Administered 2019-06-15: 17:00:00 4 mg
  Filled 2019-06-15: qty 2

## 2019-06-15 MED ORDER — ONDANSETRON HCL 4 MG/2ML IJ SOLN: 4.0000 mg | Freq: Once | INTRAMUSCULAR | Status: DC

## 2019-06-15 NOTE — ED Provider Notes (Signed)
[facility] Emergency Department Provider Note  Time seen: 5:15 PM  I have reviewed the triage vital signs and the nursing notes.   HISTORY  Chief Complaint Abdominal Pain   HPI Catherine [name] is a 78 y.o. female with a past medical history of colitis, CHF, diabetes, hypertension, hyperlipidemia, presents to the emergency department for diarrhea and right lower quadrant domino pain.  According to the patient yesterday morning she developed loose stool, noted occasional drops of blood on the toilet paper when wiping which she relates to her hemorrhoids.  Patient states last night she began experiencing some mild right lower quadrant abdominal pain that has progressively worsened, now moderate dull pain in the right lower quadrant.  Denies any fever.  No cough congestion or shortness of breath.   Past Medical History:  Diagnosis Date  . Acute colitis   . Anemia   . CAD (coronary artery disease)    a. heavy calcification of the entire LAD & mod eccentric mid LAD stenosis, estimated @ 70%, mild nonobs stenosis of RCA and LCx, severely calcified Ao valve with restricted valve mobility and 2+ AI    . Cancer [facility]) 1981   breast  . CHB (complete heart block) ([place]) 08/31/2015   [place] DR model [id number] (serial number [id number] )   . Chronic diastolic congestive heart failure ([place])    a. echo 08/31/2015: EF 65-70%, nl WM, GR1DD, Ao valve stent bioprosthesis was present and functioning nl, no regurg, LA mildly dilated, PASP 46 mm Hg  . Collagen vascular disease ([place])   . Colon polyps   . Depression   . Diabetes mellitus type II   . Fibromyalgia   . HTN (hypertension)   . Hypercholesteremia   . Hypothyroidism   . IBS (irritable bowel syndrome)   . Rectal bleeding   . Rheumatoid arthritis(714.0)   . S/P TAVR (transcatheter aortic valve replacement) 08/30/2015   26 mm Edwards Sapien 3 transcatheter heart valve placed via open right transfemoral approach   . Shortness of breath dyspnea   . Stenosis of aortic valve   . Thyroid disease     Patient Active Problem List   Diagnosis Date Noted  . Polypharmacy 03/12/2019  . Generalized weakness   . Shortness of breath   . Near syncope 10/24/2018  . Thyroid disease   . Aortic valve stenosis   . Rectal bleeding   . Hyperlipidemia   . Hypertension   . Depression   . Colon polyps   . Collagen vascular disease ([place])   . CAD (coronary artery disease)   . Anemia   . Acute colitis   . Abdominal pain   . BPPV (benign paroxysmal positional vertigo), bilateral   . CVA (cerebral vascular accident) ([place])   . TIA (transient ischemic attack) 03/09/2017  . Dizziness   . Prolonged Q-T interval on ECG 03/13/2016  . Suicidal ideation 03/06/2016  . Severe recurrent major depression without psychotic features ([place])   . Sleep disturbance 02/28/2016  . Chest pain 09/24/2015  . Hypokalemia 09/07/2015  . IBS (irritable bowel syndrome)   . Cancer ([place])   . Pneumothorax 09/05/2015  . Complete heart block ([place])   . CHB (complete heart block) ([place]) 08/31/2015  . Status post transcatheter aortic valve replacement (TAVR) using bioprosthesis 08/30/2015  . Type II diabetes mellitus ([place])   . Essential hypertension   . Chronic diastolic congestive heart failure ([place])   . Rheumatoid arthritis ([place])   . Hypothyroidism   .  Fibromyalgia   . Aortic valve stenosis, critical 08/22/2015  . MDD (major depressive disorder) ([place]) 05/26/2012    Past Surgical History:  Procedure Laterality Date  . ABDOMINAL SURGERY     Intestinal surgery  . CARDIAC SURGERY    . CESAREAN SECTION     x 2  . EP IMPLANTABLE DEVICE N/A 08/31/2015   Procedure: Pacemaker Implant;  Surgeon: Will Meredith Leeds, MD; [place] (serial number [id number] ); Laterality: Right  . LEFT OOPHORECTOMY    . MASTECTOMY Left   . polyp     . self inflicted chest wound    . [place]   lower back  . TEE  WITHOUT CARDIOVERSION N/A 08/30/2015   Procedure: TRANSESOPHAGEAL ECHOCARDIOGRAM (TEE);  Surgeon: Sherren Mocha, MD;  Location: [place];  Service: Open Heart Surgery;  Laterality: N/A;  . TOTAL ABDOMINAL HYSTERECTOMY    . TRANSCATHETER AORTIC VALVE REPLACEMENT, TRANSFEMORAL N/A 08/30/2015   Procedure: TRANSCATHETER AORTIC VALVE REPLACEMENT, TRANSFEMORAL;  Surgeon: Sherren Mocha, MD;  Location: [place];  Service: Open Heart Surgery;  Laterality: N/A;    Prior to Admission medications   Medication Sig Start Date End Date Taking? Authorizing Provider  aspirin EC 81 MG tablet Take 81 mg by mouth daily.    [provider]  atorvastatin (LIPITOR) 40 MG tablet Take 1 tablet (40 mg total) by mouth daily. 03/02/19   Minna Merritts, MD  budesonide (PULMICORT) 0.5 MG/2ML nebulizer solution Take 2 mLs (0.5 mg total) by nebulization 2 (two) times daily. 10/29/18 11/28/18  Mayo, Pete Pelt, MD  clonazePAM (KLONOPIN) 1 MG tablet Take 0.5-1 tablets (0.5-1 mg total) by mouth at bedtime. 05/14/19   Arfeen, Arlyce Harman, MD  furosemide (LASIX) 20 MG tablet Take 1 tablet (20 mg total) by mouth daily. 04/28/19 07/27/19  Minna Merritts, MD  levothyroxine (SYNTHROID, LEVOTHROID) 100 MCG tablet Take 100 mcg by mouth daily before breakfast.     [provider]  losartan (COZAAR) 50 MG tablet Take 1 tablet (50 mg total) by mouth as directed. Take HALF tablet (25 mg) for one week then increase to whole tablet (50 mg) if BP remains high. 03/12/19 06/10/19  Minna Merritts, MD  nitroGLYCERIN (NITROSTAT) 0.4 MG SL tablet Place 1 tablet (0.4 mg total) under the tongue every 5 (five) minutes as needed for chest pain. 03/02/19   Minna Merritts, MD  PARoxetine (PAXIL) 20 MG tablet Take 1 tablet (20 mg total) by mouth daily. 05/14/19 05/13/20  Arfeen, Arlyce Harman, MD  potassium chloride (K-DUR) 10 MEQ tablet Take 1 tablet (10 mEq total) by mouth daily. Take an extra 56mEq tablet on the days you take an extra Lasix. 05/01/19 07/30/19   Rise Mu, PA-C  traZODone (DESYREL) 50 MG tablet Take 0.5 tablets (25 mg total) by mouth at bedtime. 05/14/19   Arfeen, Arlyce Harman, MD    Allergies  Allergen Reactions  . Sulfa Antibiotics Hives  . Latex Rash    Family History  Problem Relation Age of Onset  . Depression Sister   . Depression Sister   . Depression Sister   . Hypertension Mother   . Lung cancer Sister   . Stroke Sister   . Hypertension Sister   . Heart attack Neg Hx     Social History Social History   Tobacco Use  . Smoking status: Never Smoker  . Smokeless tobacco: Never Used  Substance Use Topics  . Alcohol use: No  Alcohol/week: 0.0 standard drinks  . Drug use: No    Review of Systems Constitutional: Negative for fever. Cardiovascular: Negative for chest pain. Respiratory: Negative for shortness of breath.  Negative for cough. Gastrointestinal: Positive right lower quadrant abdominal pain.  Negative for nausea vomiting.  Positive for loose stool since yesterday morning. Genitourinary: Negative for urinary compaints Musculoskeletal: Negative for musculoskeletal complaints Skin: Negative for skin complaints  Neurological: Negative for headache All other ROS negative  ____________________________________________   PHYSICAL EXAM:  VITAL SIGNS: ED Triage Vitals [06/15/19 1207]  Enc Vitals Group     BP (!) 159/59     Pulse Rate 79     Resp 16     Temp 98.7 F (37.1 C)     Temp Source Oral     SpO2 100 %     Weight 198 lb (89.8 kg)     Height 5\' 3"  (1.6 m)     Head Circumference      Peak Flow      Pain Score 9     Pain Loc      Pain Edu?      Excl. in [place]?     Constitutional: Alert and oriented. Well appearing and in no distress. Eyes: Normal exam ENT      Head: Normocephalic and atraumatic.      Mouth/Throat: Mucous membranes are moist. Cardiovascular: Normal rate, regular rhythm.  Respiratory: Normal respiratory effort without tachypnea nor retractions. Breath sounds are  clear Gastrointestinal: Soft, mild to moderate right lower quadrant tenderness, mild suprapubic tenderness.  No rebound guarding or distention. Musculoskeletal: Nontender with normal range of motion in all extremities. Neurologic:  Normal speech and language. No gross focal neurologic deficits  Skin:  Skin is warm, dry and intact.  Psychiatric: Mood and affect are normal.     RADIOLOGY  CT shows no acute findings  ____________________________________________   INITIAL IMPRESSION / ASSESSMENT AND PLAN / ED COURSE  Pertinent labs & imaging results that were available during my care of the patient were reviewed by me and considered in my medical decision making (see chart for details).   Patient presents emergency department for right lower quadrant abdominal pain and loose stool.  Differential would include colitis, diverticulitis, gastroenteritis, enteritis, UTI.  We will check labs, urinalysis, given the moderate right lower quadrant tenderness to palpation we will proceed with CT imaging the abdomen/pelvis to further evaluate.  Patient agreeable to plan of care.  Lab work largely within normal limits.  Urinalysis negative.  CT pending.  Patient overall appears well, asking for something to eat and drink we will hold off until CT has resulted.  CT does not appear to show any acute findings.  Patient did unfortunately suffer an extravasation of contrast into her arm.  We will recommend cold compresses to the area and Tylenol for pain control.  Patient agreeable.  We will discharge the patient home with PCP follow-up.  Patient is asking for something to eat, is hungry and overall very well-appearing with reassuring work-up.  Catherine [name] was evaluated in Emergency Department on 06/15/2019 for the symptoms described in the history of present illness. She was evaluated in the context of the global COVID-19 pandemic, which necessitated consideration that the patient might be at risk for  infection with the SARS-CoV-2 virus that causes COVID-19. Institutional protocols and algorithms that pertain to the evaluation of patients at risk for COVID-19 are in a state of rapid change based on information released by regulatory  bodies including the CDC and federal and state organizations. These policies and algorithms were followed during the patient's care in the ED.  ____________________________________________   FINAL CLINICAL IMPRESSION(S) / ED DIAGNOSES  Right lower quadrant abdominal pain   Harvest Dark, MD 06/15/19 2021

## 2019-06-15 NOTE — ED Notes (Signed)
Pt assisted to use bedside toilet. Urinated. Peri care completed by pt.

## 2019-06-15 NOTE — ED Triage Notes (Signed)
Patient reports she had several episodes of diarrhea last night. Reports she noticed her hemorrhoids were swollen and there was some bright red blood when she had a bowel movements. Patient reports this morning she started having RLQ abdominal pain. Denies N/V.

## 2019-06-15 NOTE — ED Notes (Signed)
IV team unable to gain IV access; EDP Paduchowski notified. No new orders.

## 2019-06-15 NOTE — Progress Notes (Signed)
SHANIKIA KERNODLE    [id number]   08-16-1941  Type of procedure: CT abd/pel  06/15/2019  During your examination at [facility] some of the x-ray dye leaked into the tissues around the vein that the x-ray dye was given through.  What should you do?  1. Apply ice 20 minutes four times a day for three days.  2. Keep the affected extremity elevated above the rest of your body for 48 hours.  3. If you develop any of the following signs or symptoms, please contact Radiology at [phone]  Increased pain  Increasing swelling   Change in sensation (ex. Numbness, tingling)  Development of redness or increase in warmth around the affected area  Increasing hardness  Blistering   I have read and understand these instructions.  Any questions I raised have been discussed to my satisfaction.  I understand that failure to follow these instructions may result in additional complications.

## 2019-06-15 NOTE — ED Notes (Signed)
This RN attempted twice for 22g IV at R ac/hand. Will have 2nd RN try.

## 2019-06-15 NOTE — ED Notes (Signed)
Pt using personal phone to call husband for d/c. Pt given snack and water.

## 2019-06-15 NOTE — ED Triage Notes (Signed)
First Nurse Note:  C/O RLQ and right flank pain.    Patient AAOx3.  Skin warm and dry. NAD

## 2019-06-15 NOTE — ED Notes (Signed)
This RN at bedside with EDP Paduchowski during rectal exam.

## 2019-06-15 NOTE — ED Notes (Signed)
Per CT staff, IV will not flush or draw back blood appropriately anymore. Will notify EDP.

## 2019-06-15 NOTE — ED Notes (Signed)
Pt leaving for CT. IV adjusted; now flushes and draws back blood appropriately. Pt denies any discomfort at the site before/druing/after flushing.

## 2019-06-15 NOTE — Progress Notes (Signed)
This patient has received 169ml's of IV Omnipaque 300 contrast extravasation into right AC during a CT abd/pelvis w/contrast exam.  The exam was performed on 06/15/2019  Site / affected area assessed by Dr. Kerman Passey

## 2019-06-15 NOTE — ED Notes (Signed)
Pt leaving for CT.  

## 2019-06-15 NOTE — ED Notes (Signed)
IV team to bedside. 

## 2019-06-15 NOTE — ED Notes (Signed)
Pt drinking contrast. 

## 2019-06-22 NOTE — Progress Notes (Deleted)
06/23/2019 9:58 PM   Catherine [name] 1941/07/26 [id number]  Referring provider: Danelle Berry, NP [address] [place],  [place] [address]  No chief complaint on file.   HPI: Catherine [name] is a 78 year old female with a history of hematuria and OAB who presents today for follow up.    History of hematuria (high risk) Non-smoker.  CTU 08/26/2018 revealed nonobstructing right renal calculus measures 3 mm.  Bosniak category 2 benign kidney lesion arises from the inferior pole of the right kidney.  Aortic Atherosclerosis.  Cystoscopy with Dr.  Bernardo Heater on 08/27/2018 was NED.  She has continued to have gross hematuria off and on.  Seen in the ED on 06/15/2019 and UA 0-5 RBC's, HCT 35.8% and Hgb 10.9. Contrast CT 06/15/2019 revealed small hyperdense lesion off the lower pole of the right kidney, likely hemorrhagic cyst, stable since prior study. Punctate nephrolithiasis in the mid and lower poles of the right kidney. No ureteral stones or hydronephrosis. Adrenal glands and urinary bladder unremarkable.  ***  OAB Taking Myrbetriq ***      PMH: Past Medical History:  Diagnosis Date  . Acute colitis   . Anemia   . CAD (coronary artery disease)    a. heavy calcification of the entire LAD & mod eccentric mid LAD stenosis, estimated @ 70%, mild nonobs stenosis of RCA and LCx, severely calcified Ao valve with restricted valve mobility and 2+ AI    . Cancer [facility]) 1981   breast  . CHB (complete heart block) ([place]) 08/31/2015   [place] DR model [id number] (serial number [id number] )   . Chronic diastolic congestive heart failure ([place])    a. echo 08/31/2015: EF 65-70%, nl WM, GR1DD, Ao valve stent bioprosthesis was present and functioning nl, no regurg, LA mildly dilated, PASP 46 mm Hg  . Collagen vascular disease ([place])   . Colon polyps   . Depression   . Diabetes mellitus type II   . Fibromyalgia   . HTN (hypertension)   . Hypercholesteremia   . Hypothyroidism   . IBS  (irritable bowel syndrome)   . Rectal bleeding   . Rheumatoid arthritis(714.0)   . S/P TAVR (transcatheter aortic valve replacement) 08/30/2015   26 mm Edwards Sapien 3 transcatheter heart valve placed via open right transfemoral approach  . Shortness of breath dyspnea   . Stenosis of aortic valve   . Thyroid disease     Surgical History: Past Surgical History:  Procedure Laterality Date  . ABDOMINAL SURGERY     Intestinal surgery  . CARDIAC SURGERY    . CESAREAN SECTION     x 2  . EP IMPLANTABLE DEVICE N/A 08/31/2015   Procedure: Pacemaker Implant;  Surgeon: Will Meredith Leeds, MD; [place] (serial number [id number] ); Laterality: Right  . LEFT OOPHORECTOMY    . MASTECTOMY Left   . polyp     . self inflicted chest wound    . [place]   lower back  . TEE WITHOUT CARDIOVERSION N/A 08/30/2015   Procedure: TRANSESOPHAGEAL ECHOCARDIOGRAM (TEE);  Surgeon: Sherren Mocha, MD;  Location: [place];  Service: Open Heart Surgery;  Laterality: N/A;  . TOTAL ABDOMINAL HYSTERECTOMY    . TRANSCATHETER AORTIC VALVE REPLACEMENT, TRANSFEMORAL N/A 08/30/2015   Procedure: TRANSCATHETER AORTIC VALVE REPLACEMENT, TRANSFEMORAL;  Surgeon: Sherren Mocha, MD;  Location: [place];  Service: Open Heart Surgery;  Laterality: N/A;    Home Medications:  Allergies as of 06/23/2019      Reactions   Sulfa Antibiotics Hives   Latex Rash      Medication List       Accurate as of June 22, 2019  9:58 PM. If you have any questions, ask your nurse or doctor.        aspirin EC 81 MG tablet Take 81 mg by mouth daily.   atorvastatin 40 MG tablet Commonly known as: LIPITOR Take 1 tablet (40 mg total) by mouth daily.   budesonide 0.5 MG/2ML nebulizer solution Commonly known as: PULMICORT Take 2 mLs (0.5 mg total) by nebulization 2 (two) times daily.   clonazePAM 1 MG tablet Commonly known as: KLONOPIN Take 0.5-1 tablets (0.5-1 mg total) by mouth at bedtime.    furosemide 20 MG tablet Commonly known as: LASIX Take 1 tablet (20 mg total) by mouth daily.   levothyroxine 100 MCG tablet Commonly known as: SYNTHROID Take 100 mcg by mouth daily before breakfast.   losartan 50 MG tablet Commonly known as: COZAAR Take 1 tablet (50 mg total) by mouth as directed. Take HALF tablet (25 mg) for one week then increase to whole tablet (50 mg) if BP remains high.   nitroGLYCERIN 0.4 MG SL tablet Commonly known as: NITROSTAT Place 1 tablet (0.4 mg total) under the tongue every 5 (five) minutes as needed for chest pain.   PARoxetine 20 MG tablet Commonly known as: Paxil Take 1 tablet (20 mg total) by mouth daily.   polyethylene glycol powder 17 GM/SCOOP powder Commonly known as: GLYCOLAX/MIRALAX Take 17 g by mouth daily as needed for moderate constipation.   potassium chloride 10 MEQ tablet Commonly known as: K-DUR Take 1 tablet (10 mEq total) by mouth daily. Take an extra 27mEq tablet on the days you take an extra Lasix.   traZODone 50 MG tablet Commonly known as: DESYREL Take 0.5 tablets (25 mg total) by mouth at bedtime.       Allergies:  Allergies  Allergen Reactions  . Sulfa Antibiotics Hives  . Latex Rash    Family History: Family History  Problem Relation Age of Onset  . Depression Sister   . Depression Sister   . Depression Sister   . Hypertension Mother   . Lung cancer Sister   . Stroke Sister   . Hypertension Sister   . Heart attack Neg Hx     Social History:  reports that she has never smoked. She has never used smokeless tobacco. She reports that she does not drink alcohol or use drugs.  ROS:                                        Physical Exam: There were no vitals taken for this visit.  Constitutional:  Well nourished. Alert and oriented, No acute distress. HEENT: [place] AT, moist mucus membranes.  Trachea midline, no masses. Cardiovascular: No clubbing, cyanosis, or edema. Respiratory:  Normal respiratory effort, no increased work of breathing. GI: Abdomen is soft, non tender, non distended, no abdominal masses. Liver and spleen not palpable.  No hernias appreciated.  Stool sample for occult testing is not indicated.   GU: No CVA tenderness.  No bladder fullness or masses.  *** external genitalia, *** pubic hair distribution, no lesions.  Normal urethral meatus, no lesions, no prolapse, no discharge.   No urethral masses, tenderness and/or tenderness. No bladder fullness, tenderness  or masses. *** vagina mucosa, *** estrogen effect, no discharge, no lesions, *** pelvic support, *** cystocele and *** rectocele noted.  No cervical motion tenderness.  Uterus is freely mobile and non-fixed.  No adnexal/parametria masses or tenderness noted.  Anus and perineum are without rashes or lesions.   ***  Skin: No rashes, bruises or suspicious lesions. Lymph: No cervical or inguinal adenopathy. Neurologic: Grossly intact, no focal deficits, moving all 4 extremities. Psychiatric: Normal mood and affect.   Laboratory Data: Lab Results  Component Value Date   WBC 9.5 06/15/2019   HGB 10.9 (L) 06/15/2019   HCT 35.8 (L) 06/15/2019   MCV 89.9 06/15/2019   PLT 312 06/15/2019    Lab Results  Component Value Date   CREATININE 0.94 06/15/2019    No results found for: PSA  No results found for: TESTOSTERONE  Lab Results  Component Value Date   HGBA1C 6.6 (H) 03/10/2017    Lab Results  Component Value Date   TSH 7.993 (H) 03/10/2017       Component Value Date/Time   CHOL 186 03/10/2017 0424   HDL 43 03/10/2017 0424   CHOLHDL 4.3 03/10/2017 0424   VLDL 29 03/10/2017 0424   LDLCALC 114 (H) 03/10/2017 0424    Lab Results  Component Value Date   AST 24 06/15/2019   Lab Results  Component Value Date   ALT 18 06/15/2019   No components found for: ALKALINEPHOPHATASE No components found for: BILIRUBINTOTAL  No results found for: ESTRADIOL  Urinalysis    Component Value  Date/Time   COLORURINE STRAW (A) 06/15/2019 1630   APPEARANCEUR CLEAR (A) 06/15/2019 1630   APPEARANCEUR Clear 08/27/2018 1405   LABSPEC 1.005 06/15/2019 1630   LABSPEC 1.005 06/27/2014 1955   PHURINE 6.0 06/15/2019 1630   GLUCOSEU NEGATIVE 06/15/2019 1630   GLUCOSEU Negative 06/27/2014 1955   HGBUR NEGATIVE 06/15/2019 1630   BILIRUBINUR NEGATIVE 06/15/2019 1630   BILIRUBINUR Negative 08/27/2018 1405   BILIRUBINUR Negative 06/27/2014 1955   KETONESUR NEGATIVE 06/15/2019 1630   PROTEINUR NEGATIVE 06/15/2019 1630   UROBILINOGEN 0.2 09/06/2015 1515   NITRITE NEGATIVE 06/15/2019 1630   LEUKOCYTESUR NEGATIVE 06/15/2019 1630   LEUKOCYTESUR Negative 06/27/2014 1955    I have reviewed the labs.   Pertinent Imaging: CLINICAL DATA:  Lower abdominal pain  EXAM: CT ABDOMEN AND PELVIS WITH CONTRAST  TECHNIQUE: Multidetector CT imaging of the abdomen and pelvis was performed using the standard protocol following bolus administration of intravenous contrast.  CONTRAST:  173mL OMNIPAQUE IOHEXOL 300 MG/ML  SOLN  COMPARISON:  08/26/2018  FINDINGS: Lower chest: Pacer wires noted in the right heart. No acute findings.  Hepatobiliary: No focal hepatic abnormality. Gallbladder unremarkable.  Pancreas: No focal abnormality or ductal dilatation.  Spleen: No focal abnormality.  Normal size.  Adrenals/Urinary Tract: Small hyperdense lesion off the lower pole of the right kidney, likely hemorrhagic cyst, stable since prior study. Punctate nephrolithiasis in the mid and lower poles of the right kidney. No ureteral stones or hydronephrosis. Adrenal glands and urinary bladder unremarkable.  Stomach/Bowel: Scattered sigmoid diverticula. Moderate stool burden in the colon. No evidence of bowel obstruction. Stomach and small bowel decompressed, unremarkable.  Vascular/Lymphatic: Aortic atherosclerosis. No evidence of aneurysm or adenopathy.  Reproductive: Prior hysterectomy.   No adnexal masses.  Other: No free fluid or free air.  Musculoskeletal: No acute bony abnormality. Diffuse degenerative disc and facet disease throughout the lumbar spine.  IMPRESSION: Punctate right nephrolithiasis. No ureteral stones or hydronephrosis.  Few scattered  sigmoid diverticula.  No active diverticulitis.  Moderate stool burden in the colon.  Aortic atherosclerosis.  No acute findings in the abdomen or pelvis.   Electronically Signed   By: Rolm Baptise M.D.   On: 06/15/2019 20:11  I have independently reviewed the films.    Assessment & Plan:    1. History of hematuria Hematuria work up completed in 08/2018  - findings positive for punctate right renal stones and cysts  No report of gross hematuria *** UA today *** RTC in one year for UA - patient to report any gross hematuria in the interim    2. OAB   3. Right renal calculus No intervention warranted at this time     No follow-ups on file.  These notes generated with voice recognition software. I apologize for typographical errors.  Zara Council, PA-C  [facility] [address]  [place], [place] [address] [phone]

## 2019-06-23 ENCOUNTER — Ambulatory Visit: Payer: Self-pay | Admitting: Urology

## 2019-07-01 ENCOUNTER — Encounter (HOSPITAL_COMMUNITY): Payer: Self-pay | Admitting: Psychiatry

## 2019-07-01 ENCOUNTER — Other Ambulatory Visit: Payer: Self-pay

## 2019-07-01 ENCOUNTER — Ambulatory Visit (INDEPENDENT_AMBULATORY_CARE_PROVIDER_SITE_OTHER): Payer: Medicare Other | Admitting: Psychiatry

## 2019-07-01 DIAGNOSIS — F331 Major depressive disorder, recurrent, moderate: Secondary | ICD-10-CM

## 2019-07-01 DIAGNOSIS — F411 Generalized anxiety disorder: Secondary | ICD-10-CM | POA: Diagnosis not present

## 2019-07-01 DIAGNOSIS — F09 Unspecified mental disorder due to known physiological condition: Secondary | ICD-10-CM | POA: Diagnosis not present

## 2019-07-01 MED ORDER — PAROXETINE HCL 20 MG PO TABS: 20.0000 mg | ORAL_TABLET | Freq: Every day | ORAL | 2 refills | Status: DC

## 2019-07-01 MED ORDER — CLONAZEPAM 0.5 MG PO TABS: 0.5000 mg | ORAL_TABLET | Freq: Every day | ORAL | 2 refills | Status: DC

## 2019-07-01 MED ORDER — TRAZODONE HCL 50 MG PO TABS: ORAL_TABLET | ORAL | 2 refills | Status: DC
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
## 2019-07-16 ENCOUNTER — Ambulatory Visit (INDEPENDENT_AMBULATORY_CARE_PROVIDER_SITE_OTHER): Payer: Medicare Other | Admitting: *Deleted

## 2019-07-16 DIAGNOSIS — I442 Atrioventricular block, complete: Secondary | ICD-10-CM | POA: Diagnosis not present

## 2019-07-17 LAB — CUP PACEART REMOTE DEVICE CHECK
Battery Remaining Longevity: 119 mo
Battery Remaining Percentage: 95.5 %
Battery Voltage: 2.98 V
Brady Statistic AP VP Percent: 2.4 %
Brady Statistic AP VS Percent: 5 %
Brady Statistic AS VP Percent: 33 %
Brady Statistic AS VS Percent: 60 %
Brady Statistic RA Percent Paced: 7.2 %
Brady Statistic RV Percent Paced: 35 %
Date Time Interrogation Session: 20200813060014
Implantable Lead Implant Date: 20160928
Implantable Lead Implant Date: 20160928
Implantable Lead Location: 753859
Implantable Lead Location: 753860
Implantable Pulse Generator Implant Date: 20160928
Lead Channel Impedance Value: 390 Ohm
Lead Channel Impedance Value: 450 Ohm
Lead Channel Pacing Threshold Amplitude: 0.75 V
Lead Channel Pacing Threshold Amplitude: 0.75 V
Lead Channel Pacing Threshold Pulse Width: 0.4 ms
Lead Channel Pacing Threshold Pulse Width: 0.4 ms
Lead Channel Sensing Intrinsic Amplitude: 3.5 mV
Lead Channel Sensing Intrinsic Amplitude: 8.6 mV
Lead Channel Setting Pacing Amplitude: 2 V
Lead Channel Setting Pacing Amplitude: 2.5 V
Lead Channel Setting Pacing Pulse Width: 0.4 ms
Lead Channel Setting Sensing Sensitivity: 2.5 mV
Pulse Gen Model: 2240
Pulse Gen Serial Number: 7779295

## 2019-07-20 NOTE — Progress Notes (Signed)
07/21/2019 2:22 PM   Catherine [name] [birth year] [id number]  Referring provider: Danelle Berry, NP [address] [place],  [place] [address]  Chief Complaint  Patient presents with  . Recurrent UTI    HPI: Catherine [name] is an 78 year old female with a history of hematuria and OAB who presents today for follow up.    History of hematuria (high risk) Non-smoker.  CTU 08/26/2018 revealed nonobstructing right renal calculus measures 3 mm.  Bosniak category 2 benign kidney lesion arises from the inferior pole of the right kidney.  Aortic Atherosclerosis.  Cystoscopy with Dr.  Bernardo Heater on 08/27/2018 was NED.  She has continued to have gross hematuria off and on.  Seen in the ED on 06/15/2019 and UA 0-5 RBC's, HCT 35.8% and Hgb 10.9. Contrast CT 06/15/2019 revealed small hyperdense lesion off the lower pole of the right kidney, likely hemorrhagic cyst, stable since prior study. Punctate nephrolithiasis in the mid and lower poles of the right kidney. No ureteral stones or hydronephrosis. Adrenal glands and urinary bladder unremarkable.  She denies any gross hematuria.  Her UA is negative for AMH.    OAB Today, she is having frequency, dysuria, nocturia, incontinence and intermittency.  PVR is 0 mL.  Her BP is 155/85.  Patient denies any gross hematuria, dysuria or suprapubic/flank pain.  Patient denies any fevers, chills, nausea or vomiting.  She has been without her Myrbetriq 25 mg daily for several weeks.    Right renal stones Contrast CT in 06/2019 revealed punctate right nephrolithiasis. No ureteral stones or Hydronephrosis.  No recent flank pain or passage of fragments.  KUB 07/21/2019 Nonobstructive bowel gas pattern.  No calcifications concerning for nephrolithiasis or ureteral stones   PMH: Past Medical History:  Diagnosis Date  . Acute colitis   . Anemia   . CAD (coronary artery disease)    a. heavy calcification of the entire LAD & mod eccentric mid LAD stenosis, estimated @ 70%,  mild nonobs stenosis of RCA and LCx, severely calcified Ao valve with restricted valve mobility and 2+ AI    . Cancer [facility]) 1981   breast  . CHB (complete heart block) ([place]) 08/31/2015   [place] DR model IH4742 (serial number [id number] )   . Chronic diastolic congestive heart failure ([place])    a. echo 08/31/2015: EF 65-70%, nl WM, GR1DD, Ao valve stent bioprosthesis was present and functioning nl, no regurg, LA mildly dilated, PASP 46 mm Hg  . Collagen vascular disease ([place])   . Colon polyps   . Depression   . Diabetes mellitus type II   . Fibromyalgia   . HTN (hypertension)   . Hypercholesteremia   . Hypothyroidism   . IBS (irritable bowel syndrome)   . Rectal bleeding   . Rheumatoid arthritis(714.0)   . S/P TAVR (transcatheter aortic valve replacement) 08/30/2015   26 mm Edwards Sapien 3 transcatheter heart valve placed via open right transfemoral approach  . Shortness of breath dyspnea   . Stenosis of aortic valve   . Thyroid disease     Surgical History: Past Surgical History:  Procedure Laterality Date  . ABDOMINAL SURGERY     Intestinal surgery  . CARDIAC SURGERY    . CESAREAN SECTION     x 2  . EP IMPLANTABLE DEVICE N/A 08/31/2015   Procedure: Pacemaker Implant;  Surgeon: Will Meredith Leeds, MD; [place] (serial number [id number] ); Laterality: Right  . LEFT  OOPHORECTOMY    . MASTECTOMY Left   . polyp     . self inflicted chest wound    . [place]   lower back  . TEE WITHOUT CARDIOVERSION N/A 08/30/2015   Procedure: TRANSESOPHAGEAL ECHOCARDIOGRAM (TEE);  Surgeon: Sherren Mocha, MD;  Location: [facility];  Service: Open Heart Surgery;  Laterality: N/A;  . TOTAL ABDOMINAL HYSTERECTOMY    . TRANSCATHETER AORTIC VALVE REPLACEMENT, TRANSFEMORAL N/A 08/30/2015   Procedure: TRANSCATHETER AORTIC VALVE REPLACEMENT, TRANSFEMORAL;  Surgeon: Sherren Mocha, MD;  Location: [facility];  Service: Open Heart Surgery;  Laterality: N/A;     Home Medications:  Allergies as of 07/21/2019      Reactions   Sulfa Antibiotics Hives   Latex Rash      Medication List       Accurate as of July 21, 2019  2:22 PM. If you have any questions, ask your nurse or doctor.        aspirin EC 81 MG tablet Take 81 mg by mouth daily.   atorvastatin 40 MG tablet Commonly known as: LIPITOR Take 1 tablet (40 mg total) by mouth daily.   budesonide 0.5 MG/2ML nebulizer solution Commonly known as: PULMICORT Take 2 mLs (0.5 mg total) by nebulization 2 (two) times daily.   clonazePAM 0.5 MG tablet Commonly known as: KLONOPIN Take 1 tablet (0.5 mg total) by mouth at bedtime.   furosemide 20 MG tablet Commonly known as: LASIX Take 1 tablet (20 mg total) by mouth daily.   levothyroxine 100 MCG tablet Commonly known as: SYNTHROID Take 100 mcg by mouth daily before breakfast.   losartan 50 MG tablet Commonly known as: COZAAR Take 1 tablet (50 mg total) by mouth as directed. Take HALF tablet (25 mg) for one week then increase to whole tablet (50 mg) if BP remains high.   mirabegron ER 25 MG Tb24 tablet Commonly known as: MYRBETRIQ Take 1 tablet (25 mg total) by mouth daily. Started by: Zara Council, PA-C   nitroGLYCERIN 0.4 MG SL tablet Commonly known as: NITROSTAT Place 1 tablet (0.4 mg total) under the tongue every 5 (five) minutes as needed for chest pain.   PARoxetine 20 MG tablet Commonly known as: PAXIL Take 1 tablet (20 mg total) by mouth daily.   polyethylene glycol powder 17 GM/SCOOP powder Commonly known as: GLYCOLAX/MIRALAX Take 17 g by mouth daily as needed for moderate constipation.   potassium chloride 10 MEQ tablet Commonly known as: K-DUR Take 1 tablet (10 mEq total) by mouth daily. Take an extra 75mEq tablet on the days you take an extra Lasix.   traZODone 50 MG tablet Commonly known as: DESYREL Take 1/2 to one tab at bed time.       Allergies:  Allergies  Allergen Reactions  . Sulfa  Antibiotics Hives  . Latex Rash    Family History: Family History  Problem Relation Age of Onset  . Depression Sister   . Depression Sister   . Depression Sister   . Hypertension Mother   . Lung cancer Sister   . Stroke Sister   . Hypertension Sister   . Heart attack Neg Hx     Social History:  reports that she has never smoked. She has never used smokeless tobacco. She reports that she does not drink alcohol or use drugs.  ROS: UROLOGY Frequent Urination?: Yes Hard to postpone urination?: No Burning/pain with urination?: Yes Get up at night to urinate?: Yes Leakage of urine?: Yes Urine stream  starts and stops?: Yes Trouble starting stream?: No Do you have to strain to urinate?: No Blood in urine?: No Urinary tract infection?: Yes Sexually transmitted disease?: No Injury to kidneys or bladder?: No Painful intercourse?: No Weak stream?: No Currently pregnant?: No Vaginal bleeding?: No Last menstrual period?: n  Gastrointestinal Nausea?: No Vomiting?: No Indigestion/heartburn?: No Diarrhea?: No Constipation?: No  Constitutional Fever: No Night sweats?: Yes Weight loss?: No Fatigue?: No  Skin Skin rash/lesions?: No Itching?: No  Eyes Blurred vision?: No Double vision?: No  Ears/Nose/Throat Sore throat?: No Sinus problems?: No  Hematologic/Lymphatic Swollen glands?: No Easy bruising?: No  Cardiovascular Leg swelling?: Yes Chest pain?: Yes  Respiratory Cough?: No Shortness of breath?: Yes  Endocrine Excessive thirst?: No  Musculoskeletal Back pain?: Yes Joint pain?: No  Neurological Headaches?: Yes Dizziness?: No  Psychologic Depression?: No Anxiety?: Yes  Physical Exam: BP (!) 155/85 (BP Location: Left Arm, Patient Position: Sitting, Cuff Size: Large)   Pulse 90   Ht 5\' 3"  (1.6 m)   BMI 35.07 kg/m   Constitutional:  Well nourished. Alert and oriented, No acute distress. HEENT: [place] AT, moist mucus membranes.  Trachea midline,  no masses. Cardiovascular: No clubbing, cyanosis, or edema. Respiratory: Normal respiratory effort, no increased work of breathing. Neurologic: Grossly intact, no focal deficits, moving all 4 extremities. Psychiatric: Normal mood and affect.   Laboratory Data: Lab Results  Component Value Date   WBC 9.5 06/15/2019   HGB 10.9 (L) 06/15/2019   HCT 35.8 (L) 06/15/2019   MCV 89.9 06/15/2019   PLT 312 06/15/2019    Lab Results  Component Value Date   CREATININE 0.94 06/15/2019    No results found for: PSA  No results found for: TESTOSTERONE  Lab Results  Component Value Date   HGBA1C 6.6 (H) 03/10/2017    Lab Results  Component Value Date   TSH 7.993 (H) 03/10/2017       Component Value Date/Time   CHOL 186 03/10/2017 0424   HDL 43 03/10/2017 0424   CHOLHDL 4.3 03/10/2017 0424   VLDL 29 03/10/2017 0424   LDLCALC 114 (H) 03/10/2017 0424    Lab Results  Component Value Date   AST 24 06/15/2019   Lab Results  Component Value Date   ALT 18 06/15/2019   No components found for: ALKALINEPHOPHATASE No components found for: BILIRUBINTOTAL  No results found for: ESTRADIOL  Urinalysis Component     Latest Ref Rng & Units 07/21/2019  Specific Gravity, UA     1.005 - 1.030 1.015  pH, UA     5.0 - 7.5 5.0  Color, UA     Yellow Yellow  Appearance Ur     Clear Clear  Leukocytes,UA     Negative Negative  Protein,UA     Negative/Trace Negative  Glucose, UA     Negative Trace (A)  Ketones, UA     Negative Negative  RBC, UA     Negative Negative  Bilirubin, UA     Negative Negative  Urobilinogen, Ur     0.2 - 1.0 mg/dL 0.2  Nitrite, UA     Negative Negative  Microscopic Examination      See below:   Component     Latest Ref Rng & Units 07/21/2019          WBC, UA     0 - 5 /hpf 0-5  RBC     0 - 2 /hpf None seen  Epithelial Cells (non renal)  0 - 10 /hpf 0-10  Bacteria, UA     None seen/Few Few    I have reviewed the labs.   Pertinent  Imaging: Results for AOI, KOUNS (MRN [id number]) as of 07/21/2019 14:18  Ref. Range 07/21/2019 13:55  Scan Result Unknown 0   CLINICAL DATA:  Flank pain.  Left lower quadrant pain x2 months.  EXAM: ABDOMEN - 1 VIEW  COMPARISON:  March 09, 2017  FINDINGS: There is a small calcification that projects over the patient's left hemipelvis that is favored to represent a small phlebolith. There are degenerative changes of both hips. There is no definite acute displaced fracture or dislocation. There are degenerative changes throughout the visualized lumbar spine. Extensive vascular calcifications are noted.  IMPRESSION: 1. Nonobstructive bowel gas pattern. 2. No calcifications concerning for nephrolithiasis or ureteral stones.   Electronically Signed   By: Constance Holster M.D.   On: 07/21/2019 15:48    Assessment & Plan:    1. History of hematuria Hematuria work up completed in 08/2018  - findings positive for punctate right renal stones and cysts  No report of gross hematuria  UA today negative for AMH  RTC in one year for UA - patient to report any gross hematuria in the interim    2. OAB Given Myrbetriq 25 mg samples and prescription RTC in 6 weeks for PVR and OAB questionnaire   3. Right renal calculus No intervention warranted at this time     Return in about 6 weeks (around 09/01/2019) for PVR and OAB questionnaire.  These notes generated with voice recognition software. I apologize for typographical errors.  Zara Council, PA-C  [facility] [address]  [place], [place] [address] [phone]

## 2019-07-21 ENCOUNTER — Ambulatory Visit
Admission: RE | Admit: 2019-07-21 | Discharge: 2019-07-21 | Disposition: A | Discharge status: Home / Self Care | Payer: Medicare Other | Source: Ambulatory Visit | Attending: Urology | Admitting: Urology

## 2019-07-21 ENCOUNTER — Ambulatory Visit
Admission: RE | Admit: 2019-07-21 | Discharge: 2019-07-21 | Disposition: A | Discharge status: Home / Self Care | Payer: Medicare Other | Attending: Urology | Admitting: Urology

## 2019-07-21 ENCOUNTER — Ambulatory Visit (INDEPENDENT_AMBULATORY_CARE_PROVIDER_SITE_OTHER): Payer: Medicare Other | Admitting: Urology

## 2019-07-21 ENCOUNTER — Other Ambulatory Visit: Payer: Self-pay

## 2019-07-21 ENCOUNTER — Encounter: Payer: Self-pay | Admitting: Urology

## 2019-07-21 VITALS — BP 155/85 | HR 90 | Ht 63.0 in

## 2019-07-21 DIAGNOSIS — Z87448 Personal history of other diseases of urinary system: Secondary | ICD-10-CM

## 2019-07-21 DIAGNOSIS — N200 Calculus of kidney: Secondary | ICD-10-CM | POA: Insufficient documentation

## 2019-07-21 DIAGNOSIS — N3941 Urge incontinence: Secondary | ICD-10-CM

## 2019-07-21 LAB — URINALYSIS, COMPLETE
Bilirubin, UA: NEGATIVE
Ketones, UA: NEGATIVE
Leukocytes,UA: NEGATIVE
Nitrite, UA: NEGATIVE
Protein,UA: NEGATIVE
RBC, UA: NEGATIVE
Specific Gravity, UA: 1.015 (ref 1.005–1.030)
Urobilinogen, Ur: 0.2 mg/dL (ref 0.2–1.0)
pH, UA: 5 (ref 5.0–7.5)

## 2019-07-21 LAB — MICROSCOPIC EXAMINATION: RBC, Urine: NONE SEEN /hpf (ref 0–2)

## 2019-07-21 LAB — BLADDER SCAN AMB NON-IMAGING: Scan Result: 0

## 2019-07-21 MED ORDER — MIRABEGRON ER 25 MG PO TB24: 25.0000 mg | ORAL_TABLET | Freq: Every day | ORAL | 3 refills | Status: DC

## 2019-07-27 ENCOUNTER — Encounter: Payer: Self-pay | Admitting: Cardiology

## 2019-07-27 NOTE — Progress Notes (Signed)
Remote pacemaker transmission.   

## 2019-09-01 NOTE — Progress Notes (Deleted)
09/02/2019 11:04 AM   [name] 06-Aug-1941 [id number]  Referring provider: Danelle Berry, NP [address] [place],  [place] [address]  No chief complaint on file.   HPI: Mrs. Harding is an 78 year old female with a history of hematuria and OAB who presents today for follow up.    History of hematuria (high risk) Non-smoker.  CTU 08/26/2018 revealed nonobstructing right renal calculus measures 3 mm.  Bosniak category 2 benign kidney lesion arises from the inferior pole of the right kidney.  Aortic Atherosclerosis.  Cystoscopy with Dr.  Bernardo Heater on 08/27/2018 was NED.  She has continued to have gross hematuria off and on.  Seen in the ED on 06/15/2019 and UA 0-5 RBC's, HCT 35.8% and Hgb 10.9. Contrast CT 06/15/2019 revealed small hyperdense lesion off the lower pole of the right kidney, likely hemorrhagic cyst, stable since prior study. Punctate nephrolithiasis in the mid and lower poles of the right kidney. No ureteral stones or hydronephrosis. Adrenal glands and urinary bladder unremarkable.  She denies any gross hematuria.  Her UA is negative for AMH.    OAB Today, she is having frequency, dysuria, nocturia, incontinence and intermittency.  PVR is 0 mL.  Her BP is 155/85.  Patient denies any gross hematuria, dysuria or suprapubic/flank pain.  Patient denies any fevers, chills, nausea or vomiting.  She has been without her Myrbetriq 25 mg daily for several weeks.    Right renal stones Contrast CT in 06/2019 revealed punctate right nephrolithiasis. No ureteral stones or Hydronephrosis.  No recent flank pain or passage of fragments.  KUB 07/21/2019 Nonobstructive bowel gas pattern.  No calcifications concerning for nephrolithiasis or ureteral stones   PMH: Past Medical History:  Diagnosis Date  . Acute colitis   . Anemia   . CAD (coronary artery disease)    a. heavy calcification of the entire LAD & mod eccentric mid LAD stenosis, estimated @ 70%, mild nonobs stenosis of RCA  and LCx, severely calcified Ao valve with restricted valve mobility and 2+ AI    . Cancer [facility]) 1981   breast  . CHB (complete heart block) ([place]) 08/31/2015   [place] DR model [id number] (serial number [id number] )   . Chronic diastolic congestive heart failure ([place])    a. echo 08/31/2015: EF 65-70%, nl WM, GR1DD, Ao valve stent bioprosthesis was present and functioning nl, no regurg, LA mildly dilated, PASP 46 mm Hg  . Collagen vascular disease ([place])   . Colon polyps   . Depression   . Diabetes mellitus type II   . Fibromyalgia   . HTN (hypertension)   . Hypercholesteremia   . Hypothyroidism   . IBS (irritable bowel syndrome)   . Rectal bleeding   . Rheumatoid arthritis(714.0)   . S/P TAVR (transcatheter aortic valve replacement) 08/30/2015   26 mm Edwards Sapien 3 transcatheter heart valve placed via open right transfemoral approach  . Shortness of breath dyspnea   . Stenosis of aortic valve   . Thyroid disease     Surgical History: Past Surgical History:  Procedure Laterality Date  . ABDOMINAL SURGERY     Intestinal surgery  . CARDIAC SURGERY    . CESAREAN SECTION     x 2  . EP IMPLANTABLE DEVICE N/A 08/31/2015   Procedure: Pacemaker Implant;  Surgeon: Will Meredith Leeds, MD; [place] (serial number [id number] ); Laterality: Right  . LEFT OOPHORECTOMY    . MASTECTOMY  Left   . polyp     . self inflicted chest wound    . [place]   lower back  . TEE WITHOUT CARDIOVERSION N/A 08/30/2015   Procedure: TRANSESOPHAGEAL ECHOCARDIOGRAM (TEE);  Surgeon: Sherren Mocha, MD;  Location: [facility];  Service: Open Heart Surgery;  Laterality: N/A;  . TOTAL ABDOMINAL HYSTERECTOMY    . TRANSCATHETER AORTIC VALVE REPLACEMENT, TRANSFEMORAL N/A 08/30/2015   Procedure: TRANSCATHETER AORTIC VALVE REPLACEMENT, TRANSFEMORAL;  Surgeon: Sherren Mocha, MD;  Location: [facility];  Service: Open Heart Surgery;  Laterality: N/A;    Home Medications:   Allergies as of 09/02/2019      Reactions   Sulfa Antibiotics Hives   Latex Rash      Medication List       Accurate as of September 01, 2019 11:04 AM. If you have any questions, ask your nurse or doctor.        aspirin EC 81 MG tablet Take 81 mg by mouth daily.   atorvastatin 40 MG tablet Commonly known as: LIPITOR Take 1 tablet (40 mg total) by mouth daily.   budesonide 0.5 MG/2ML nebulizer solution Commonly known as: PULMICORT Take 2 mLs (0.5 mg total) by nebulization 2 (two) times daily.   clonazePAM 0.5 MG tablet Commonly known as: KLONOPIN Take 1 tablet (0.5 mg total) by mouth at bedtime.   furosemide 20 MG tablet Commonly known as: LASIX Take 1 tablet (20 mg total) by mouth daily.   levothyroxine 100 MCG tablet Commonly known as: SYNTHROID Take 100 mcg by mouth daily before breakfast.   losartan 50 MG tablet Commonly known as: COZAAR Take 1 tablet (50 mg total) by mouth as directed. Take HALF tablet (25 mg) for one week then increase to whole tablet (50 mg) if BP remains high.   mirabegron ER 25 MG Tb24 tablet Commonly known as: MYRBETRIQ Take 1 tablet (25 mg total) by mouth daily.   nitroGLYCERIN 0.4 MG SL tablet Commonly known as: NITROSTAT Place 1 tablet (0.4 mg total) under the tongue every 5 (five) minutes as needed for chest pain.   PARoxetine 20 MG tablet Commonly known as: PAXIL Take 1 tablet (20 mg total) by mouth daily.   polyethylene glycol powder 17 GM/SCOOP powder Commonly known as: GLYCOLAX/MIRALAX Take 17 g by mouth daily as needed for moderate constipation.   potassium chloride 10 MEQ tablet Commonly known as: K-DUR Take 1 tablet (10 mEq total) by mouth daily. Take an extra 43mEq tablet on the days you take an extra Lasix.   traZODone 50 MG tablet Commonly known as: DESYREL Take 1/2 to one tab at bed time.       Allergies:  Allergies  Allergen Reactions  . Sulfa Antibiotics Hives  . Latex Rash    Family History: Family  History  Problem Relation Age of Onset  . Depression Sister   . Depression Sister   . Depression Sister   . Hypertension Mother   . Lung cancer Sister   . Stroke Sister   . Hypertension Sister   . Heart attack Neg Hx     Social History:  reports that she has never smoked. She has never used smokeless tobacco. She reports that she does not drink alcohol or use drugs.  ROS:  Physical Exam: There were no vitals taken for this visit.  Constitutional:  Well nourished. Alert and oriented, No acute distress. HEENT: [place] AT, moist mucus membranes.  Trachea midline, no masses. Cardiovascular: No clubbing, cyanosis, or edema. Respiratory: Normal respiratory effort, no increased work of breathing. Neurologic: Grossly intact, no focal deficits, moving all 4 extremities. Psychiatric: Normal mood and affect.   Laboratory Data: Lab Results  Component Value Date   WBC 9.5 06/15/2019   HGB 10.9 (L) 06/15/2019   HCT 35.8 (L) 06/15/2019   MCV 89.9 06/15/2019   PLT 312 06/15/2019    Lab Results  Component Value Date   CREATININE 0.94 06/15/2019    No results found for: PSA  No results found for: TESTOSTERONE  Lab Results  Component Value Date   HGBA1C 6.6 (H) 03/10/2017    Lab Results  Component Value Date   TSH 7.993 (H) 03/10/2017       Component Value Date/Time   CHOL 186 03/10/2017 0424   HDL 43 03/10/2017 0424   CHOLHDL 4.3 03/10/2017 0424   VLDL 29 03/10/2017 0424   LDLCALC 114 (H) 03/10/2017 0424    Lab Results  Component Value Date   AST 24 06/15/2019   Lab Results  Component Value Date   ALT 18 06/15/2019   No components found for: ALKALINEPHOPHATASE No components found for: BILIRUBINTOTAL  No results found for: ESTRADIOL  Urinalysis Component     Latest Ref Rng & Units 07/21/2019  Specific Gravity, UA     1.005 - 1.030 1.015  pH, UA     5.0 - 7.5 5.0  Color, UA     Yellow Yellow   Appearance Ur     Clear Clear  Leukocytes,UA     Negative Negative  Protein,UA     Negative/Trace Negative  Glucose, UA     Negative Trace (A)  Ketones, UA     Negative Negative  RBC, UA     Negative Negative  Bilirubin, UA     Negative Negative  Urobilinogen, Ur     0.2 - 1.0 mg/dL 0.2  Nitrite, UA     Negative Negative  Microscopic Examination      See below:   Component     Latest Ref Rng & Units 07/21/2019          WBC, UA     0 - 5 /hpf 0-5  RBC     0 - 2 /hpf None seen  Epithelial Cells (non renal)     0 - 10 /hpf 0-10  Bacteria, UA     None seen/Few Few    I have reviewed the labs.   Pertinent Imaging: ***    Assessment & Plan:    1. History of hematuria Hematuria work up completed in 08/2018  - findings positive for punctate right renal stones and cysts  No report of gross hematuria  UA today negative for AMH (07/21/2019) RTC in one year for UA - patient to report any gross hematuria in the interim    2. OAB Given Myrbetriq 25 mg samples and prescription RTC in 6 weeks for PVR and OAB questionnaire   3. Right renal calculus No intervention warranted at this time     No follow-ups on file.  These notes generated with voice recognition software. I apologize for typographical errors.  Zara Council, PA-C  [facility] [address]  [place], [place] [address] [phone]

## 2019-09-02 ENCOUNTER — Ambulatory Visit: Payer: Medicare Other | Admitting: Urology

## 2019-09-02 ENCOUNTER — Encounter: Payer: Self-pay | Admitting: Urology

## 2019-09-06 ENCOUNTER — Other Ambulatory Visit: Payer: Self-pay | Admitting: Physician Assistant

## 2019-09-12 ENCOUNTER — Encounter: Payer: Self-pay | Admitting: Emergency Medicine

## 2019-09-12 ENCOUNTER — Emergency Department: Payer: Medicare Other

## 2019-09-12 ENCOUNTER — Other Ambulatory Visit: Payer: Self-pay

## 2019-09-12 ENCOUNTER — Emergency Department
Admission: EM | Admit: 2019-09-12 | Discharge: 2019-09-12 | Disposition: A | Discharge status: Home / Self Care | Payer: Medicare Other | Attending: Emergency Medicine | Admitting: Emergency Medicine

## 2019-09-12 DIAGNOSIS — Z79899 Other long term (current) drug therapy: Secondary | ICD-10-CM | POA: Diagnosis not present

## 2019-09-12 DIAGNOSIS — Z7982 Long term (current) use of aspirin: Secondary | ICD-10-CM | POA: Diagnosis not present

## 2019-09-12 DIAGNOSIS — M19011 Primary osteoarthritis, right shoulder: Secondary | ICD-10-CM | POA: Diagnosis not present

## 2019-09-12 DIAGNOSIS — E119 Type 2 diabetes mellitus without complications: Secondary | ICD-10-CM | POA: Diagnosis not present

## 2019-09-12 DIAGNOSIS — I110 Hypertensive heart disease with heart failure: Secondary | ICD-10-CM | POA: Diagnosis not present

## 2019-09-12 DIAGNOSIS — I5032 Chronic diastolic (congestive) heart failure: Secondary | ICD-10-CM | POA: Insufficient documentation

## 2019-09-12 DIAGNOSIS — M25511 Pain in right shoulder: Secondary | ICD-10-CM | POA: Diagnosis present

## 2019-09-12 DIAGNOSIS — I2510 Atherosclerotic heart disease of native coronary artery without angina pectoris: Secondary | ICD-10-CM | POA: Insufficient documentation

## 2019-09-12 MED ORDER — PREDNISONE 20 MG PO TABS
60.0000 mg | ORAL_TABLET | Freq: Once | ORAL | Status: AC
  Administered 2019-09-12: 60 mg via ORAL
  Filled 2019-09-12: qty 3

## 2019-09-12 MED ORDER — HYDROCODONE-ACETAMINOPHEN 5-325 MG PO TABS
1.0000 | ORAL_TABLET | Freq: Once | ORAL | Status: AC
  Administered 2019-09-12: 17:00:00 1 via ORAL
  Filled 2019-09-12: qty 1

## 2019-09-12 MED ORDER — HYDROCODONE-ACETAMINOPHEN 5-325 MG PO TABS: 1.0000 | ORAL_TABLET | ORAL | 0 refills | Status: DC | PRN

## 2019-09-12 MED ORDER — PREDNISONE 50 MG PO TABS: 50.0000 mg | ORAL_TABLET | Freq: Every day | ORAL | 0 refills | Status: DC

## 2019-09-12 NOTE — ED Notes (Signed)
Unable to sign during to e-signature failure.

## 2019-09-12 NOTE — ED Provider Notes (Signed)
[facility] Emergency Department Provider Note  ____________________________________________  Time seen: Approximately 3:01 PM  I have reviewed the triage vital signs and the nursing notes.   HISTORY  Chief Complaint Shoulder Pain    HPI Catherine [name] is a 78 y.o. female who presents to the emergency department for evaluation of right shoulder pain.  Patient reports that yesterday she was using a manual can opener to open up a can for dinner.  Patient reports that she felt a sharp pain in the anterior aspect of her shoulder, pain radiating from her shoulder down to her hand.  Patient has a history of chronic pain to the right shoulder and states that she should be seeing orthopedics but has not followed up with them.  Patient denies any headache, neck pain or stiffness, chest pain, shortness of breath.  Patient has a lengthy medical history as described below.  No complaints of chronic medical problems, she is complaining of right shoulder pain at this time.          Past Medical History:  Diagnosis Date  . Acute colitis   . Anemia   . CAD (coronary artery disease)    a. heavy calcification of the entire LAD & mod eccentric mid LAD stenosis, estimated @ 70%, mild nonobs stenosis of RCA and LCx, severely calcified Ao valve with restricted valve mobility and 2+ AI    . Cancer [facility]) 1981   breast  . CHB (complete heart block) ([place]) 08/31/2015   [place] DR model [id number] (serial number [id number] )   . Chronic diastolic congestive heart failure ([place])    a. echo 08/31/2015: EF 65-70%, nl WM, GR1DD, Ao valve stent bioprosthesis was present and functioning nl, no regurg, LA mildly dilated, PASP 46 mm Hg  . Collagen vascular disease ([place])   . Colon polyps   . Depression   . Diabetes mellitus type II   . Fibromyalgia   . HTN (hypertension)   . Hypercholesteremia   . Hypothyroidism   . IBS (irritable bowel syndrome)   . Rectal bleeding   .  Rheumatoid arthritis(714.0)   . S/P TAVR (transcatheter aortic valve replacement) 08/30/2015   26 mm Edwards Sapien 3 transcatheter heart valve placed via open right transfemoral approach  . Shortness of breath dyspnea   . Stenosis of aortic valve   . Thyroid disease     Patient Active Problem List   Diagnosis Date Noted  . Polypharmacy 03/12/2019  . Generalized weakness   . Shortness of breath   . Near syncope 10/24/2018  . Thyroid disease   . Aortic valve stenosis   . Rectal bleeding   . Hyperlipidemia   . Hypertension   . Depression   . Colon polyps   . Collagen vascular disease ([place])   . CAD (coronary artery disease)   . Anemia   . Acute colitis   . Abdominal pain   . BPPV (benign paroxysmal positional vertigo), bilateral   . CVA (cerebral vascular accident) ([place])   . TIA (transient ischemic attack) 03/09/2017  . Dizziness   . Prolonged Q-T interval on ECG 03/13/2016  . Suicidal ideation 03/06/2016  . Severe recurrent major depression without psychotic features ([place])   . Sleep disturbance 02/28/2016  . Chest pain 09/24/2015  . Hypokalemia 09/07/2015  . IBS (irritable bowel syndrome)   . Cancer ([place])   . Pneumothorax 09/05/2015  . Complete heart block ([place])   . CHB (complete heart block) ([place]) 08/31/2015  .  Status post transcatheter aortic valve replacement (TAVR) using bioprosthesis 08/30/2015  . Type II diabetes mellitus ([place])   . Essential hypertension   . Chronic diastolic congestive heart failure ([place])   . Rheumatoid arthritis ([place])   . Hypothyroidism   . Fibromyalgia   . Aortic valve stenosis, critical 08/22/2015  . MDD (major depressive disorder) ([place]) 05/26/2012    Past Surgical History:  Procedure Laterality Date  . ABDOMINAL SURGERY     Intestinal surgery  . CARDIAC SURGERY    . CESAREAN SECTION     x 2  . EP IMPLANTABLE DEVICE N/A 08/31/2015   Procedure: Pacemaker Implant;  Surgeon: Will Meredith Leeds, MD; [place] (serial number [id number] ); Laterality: Right  . LEFT OOPHORECTOMY    . MASTECTOMY Left   . polyp     . self inflicted chest wound    . [place]   lower back  . TEE WITHOUT CARDIOVERSION N/A 08/30/2015   Procedure: TRANSESOPHAGEAL ECHOCARDIOGRAM (TEE);  Surgeon: Sherren Mocha, MD;  Location: [facility];  Service: Open Heart Surgery;  Laterality: N/A;  . TOTAL ABDOMINAL HYSTERECTOMY    . TRANSCATHETER AORTIC VALVE REPLACEMENT, TRANSFEMORAL N/A 08/30/2015   Procedure: TRANSCATHETER AORTIC VALVE REPLACEMENT, TRANSFEMORAL;  Surgeon: Sherren Mocha, MD;  Location: [place];  Service: Open Heart Surgery;  Laterality: N/A;    Prior to Admission medications   Medication Sig Start Date End Date Taking? Authorizing Provider  aspirin EC 81 MG tablet Take 81 mg by mouth daily.    [provider]  atorvastatin (LIPITOR) 40 MG tablet Take 1 tablet (40 mg total) by mouth daily. 03/02/19   Minna Merritts, MD  budesonide (PULMICORT) 0.5 MG/2ML nebulizer solution Take 2 mLs (0.5 mg total) by nebulization 2 (two) times daily. 10/29/18 11/28/18  Mayo, Pete Pelt, MD  clonazePAM (KLONOPIN) 0.5 MG tablet Take 1 tablet (0.5 mg total) by mouth at bedtime. 07/01/19   Arfeen, Arlyce Harman, MD  furosemide (LASIX) 20 MG tablet Take 1 tablet (20 mg total) by mouth daily. Patient not taking: Reported on 07/21/2019 04/28/19 07/27/19  Minna Merritts, MD  HYDROcodone-acetaminophen (NORCO/VICODIN) 5-325 MG tablet Take 1 tablet by mouth every 4 (four) hours as needed for moderate pain. 09/12/19   Cuthriell, Charline Bills, PA-C  levothyroxine (SYNTHROID, LEVOTHROID) 100 MCG tablet Take 100 mcg by mouth daily before breakfast.     [provider]  losartan (COZAAR) 50 MG tablet Take 1 tablet (50 mg total) by mouth as directed. Take HALF tablet (25 mg) for one week then increase to whole tablet (50 mg) if BP remains high. 03/12/19 06/10/19  Minna Merritts, MD  mirabegron ER (MYRBETRIQ) 25 MG TB24 tablet Take 1  tablet (25 mg total) by mouth daily. 07/21/19   McGowan, Larene Beach A, PA-C  nitroGLYCERIN (NITROSTAT) 0.4 MG SL tablet Place 1 tablet (0.4 mg total) under the tongue every 5 (five) minutes as needed for chest pain. 03/02/19   Minna Merritts, MD  PARoxetine (PAXIL) 20 MG tablet Take 1 tablet (20 mg total) by mouth daily. 07/01/19 06/30/20  Arfeen, Arlyce Harman, MD  polyethylene glycol powder (GLYCOLAX/MIRALAX) 17 GM/SCOOP powder Take 17 g by mouth daily as needed for moderate constipation. 06/15/19   Harvest Dark, MD  potassium chloride (KLOR-CON) 10 MEQ tablet TAKE 1 TABLET BY MOUTH EVERY DAY TAKE AN EXTRA TABLET ON DAYS WHEN YOU TAKE AN EXTRA LASIX 09/07/19   Rise Mu, PA-C  predniSONE (  DELTASONE) 50 MG tablet Take 1 tablet (50 mg total) by mouth daily with breakfast. 09/12/19   Cuthriell, Charline Bills, PA-C  traZODone (DESYREL) 50 MG tablet Take 1/2 to one tab at bed time. 07/01/19   Arfeen, Arlyce Harman, MD    Allergies Sulfa antibiotics and Latex  Family History  Problem Relation Age of Onset  . Depression Sister   . Depression Sister   . Depression Sister   . Hypertension Mother   . Lung cancer Sister   . Stroke Sister   . Hypertension Sister   . Heart attack Neg Hx     Social History Social History   Tobacco Use  . Smoking status: Never Smoker  . Smokeless tobacco: Never Used  Substance Use Topics  . Alcohol use: No    Alcohol/week: 0.0 standard drinks  . Drug use: No     Review of Systems  Constitutional: No fever/chills Eyes: No visual changes. No discharge ENT: No upper respiratory complaints. Cardiovascular: no chest pain. Respiratory: no cough. No SOB. Gastrointestinal: No abdominal pain.  No nausea, no vomiting.  Musculoskeletal: Complaining of right shoulder pain Skin: Negative for rash, abrasions, lacerations, ecchymosis. Neurological: Negative for headaches, focal weakness or numbness. 10-point ROS otherwise  negative.  ____________________________________________   PHYSICAL EXAM:  VITAL SIGNS: ED Triage Vitals  Enc Vitals Group     BP 09/12/19 1414 (!) 147/62     Pulse Rate 09/12/19 1414 94     Resp 09/12/19 1414 18     Temp 09/12/19 1414 98.3 F (36.8 C)     Temp Source 09/12/19 1414 Oral     SpO2 09/12/19 1414 98 %     Weight 09/12/19 1415 198 lb (89.8 kg)     Height 09/12/19 1415 5' 2.5" (1.588 m)     Head Circumference --      Peak Flow --      Pain Score 09/12/19 1415 10     Pain Loc --      Pain Edu? --      Excl. in [place]? --      Constitutional: Alert and oriented. Well appearing and in no acute distress. Eyes: Conjunctivae are normal. PERRL. EOMI. Head: Atraumatic. ENT:      Ears:       Nose: No congestion/rhinnorhea.      Mouth/Throat: Mucous membranes are moist.  Neck: No stridor.  No cervical spine tenderness to palpation.  Cardiovascular: Normal rate, regular rhythm. Normal S1 and S2.  Good peripheral circulation. Respiratory: Normal respiratory effort without tachypnea or retractions. Lungs CTAB. Good air entry to the bases with no decreased or absent breath sounds. Musculoskeletal: Full range of motion to all extremities. No gross deformities appreciated.Visualization of the right shoulder reveals no gross edema, ecchymosis, erythema.  Patient has limited range of motion due to pain.  Patient is tender over the acromioclavicular joint as well as the proximal biceps.  No other gross tenderness to palpation.  No palpable abnormality or deficit in this region.  Examination of the cervical spine and elbow is unremarkable.  Radial pulse intact distally.  Sensation intact and equal to unaffected extremity. Neurologic:  Normal speech and language. No gross focal neurologic deficits are appreciated.  Skin:  Skin is warm, dry and intact. No rash noted. Psychiatric: Mood and affect are normal. Speech and behavior are normal. Patient exhibits appropriate insight and  judgement.   ____________________________________________   LABS (all labs ordered are listed, but only abnormal results are displayed)  Labs Reviewed - No data to display ____________________________________________  EKG   ____________________________________________  RADIOLOGY I personally viewed and evaluated these images as part of my medical decision making, as well as reviewing the written report by the radiologist.  Dg Shoulder Right  Result Date: 09/12/2019 CLINICAL DATA:  Acute on chronic shoulder pain. EXAM: RIGHT SHOULDER - 2+ VIEW COMPARISON:  November 02, 2015 FINDINGS: There is no evidence of fracture or dislocation. Osteopenia. Osteoarthritic changes at the acromioclavicular joint, and glenohumeral joint. Subacromial osteophytosis also noted. Partially visualized cardiac pacemaker apparatus. Soft tissues otherwise normal. IMPRESSION: 1. No acute fracture or dislocation identified about the right shoulder. 2. Osteoarthritic changes of the acromioclavicular joint and glenohumeral joint. Electronically Signed   By: Fidela Salisbury M.D.   On: 09/12/2019 15:37    ____________________________________________    PROCEDURES  Procedure(s) performed:    Procedures    Medications  HYDROcodone-acetaminophen (NORCO/VICODIN) 5-325 MG per tablet 1 tablet (has no administration in time range)  predniSONE (DELTASONE) tablet 60 mg (has no administration in time range)     ____________________________________________   INITIAL IMPRESSION / ASSESSMENT AND PLAN / ED COURSE  Pertinent labs & imaging results that were available during my care of the patient were reviewed by me and considered in my medical decision making (see chart for details).  Review of the [place] CSRS was performed in accordance of the [place] prior to dispensing any controlled drugs.           Patient's diagnosis is consistent with right shoulder pain secondary to arthritis.  Patient presented to  the emergency department with acute on chronic right shoulder pain.  X-ray reveals findings consistent with arthritis.  I suspect given pain complaint, radiation as well as tenderness over the acromioclavicular joint space the patient has mild impingement of the right acromioclavicular joint.  Patient has a long history of rheumatoid and osteoarthritis.  Patiently placed on a short course of steroid as well as pain medication.  Follow-up with orthopedics.  No indication for further work-up at this time. Patient is given ED precautions to return to the ED for any worsening or new symptoms.     ____________________________________________  FINAL CLINICAL IMPRESSION(S) / ED DIAGNOSES  Final diagnoses:  Arthritis of right shoulder region      NEW MEDICATIONS STARTED DURING THIS VISIT:  ED Discharge Orders         Ordered    HYDROcodone-acetaminophen (NORCO/VICODIN) 5-325 MG tablet  Every 4 hours PRN     09/12/19 1706    predniSONE (DELTASONE) 50 MG tablet  Daily with breakfast     09/12/19 1706              This chart was dictated using voice recognition software/Dragon. Despite best efforts to proofread, errors can occur which can change the meaning. Any change was purely unintentional.    Darletta Moll, PA-C 09/12/19 1706    Arta Silence, MD 09/12/19 Lurena Nida

## 2019-09-12 NOTE — ED Notes (Signed)
Pt c/o right should pain radiating down to her hand. Pt reports not being able to open a jar at home last night due to the pain.

## 2019-09-12 NOTE — ED Notes (Signed)
Family at bedside to take pt home.

## 2019-09-12 NOTE — ED Triage Notes (Signed)
R shoulder pain x 1 year. States noticed worse yesterday after using can opener.

## 2019-09-16 ENCOUNTER — Other Ambulatory Visit (HOSPITAL_COMMUNITY): Payer: Self-pay | Admitting: Psychiatry

## 2019-09-16 DIAGNOSIS — F411 Generalized anxiety disorder: Secondary | ICD-10-CM

## 2019-09-16 DIAGNOSIS — F331 Major depressive disorder, recurrent, moderate: Secondary | ICD-10-CM

## 2019-09-17 DIAGNOSIS — M7541 Impingement syndrome of right shoulder: Secondary | ICD-10-CM | POA: Insufficient documentation

## 2019-09-24 ENCOUNTER — Telehealth: Payer: Self-pay

## 2019-09-24 NOTE — Telephone Encounter (Signed)
Spoke with [facility] representative who is requesting a prescription for 20mg  of atorvastatin instead of 40mg  because patient reports muscle pain when taking higher dose. Patient states she does not experience this when she cuts the tablet in half. Please advise if OK to decrease atorvastatin. Thank you!

## 2019-09-24 NOTE — Telephone Encounter (Signed)
Message fwd to Dr. Rockey Situ for approval to send in Rx for Atorvastatin 20mg  qd.

## 2019-09-25 ENCOUNTER — Other Ambulatory Visit (HOSPITAL_COMMUNITY): Payer: Self-pay

## 2019-09-25 DIAGNOSIS — F411 Generalized anxiety disorder: Secondary | ICD-10-CM

## 2019-09-25 DIAGNOSIS — F331 Major depressive disorder, recurrent, moderate: Secondary | ICD-10-CM

## 2019-09-25 MED ORDER — TRAZODONE HCL 50 MG PO TABS: ORAL_TABLET | ORAL | 0 refills | Status: DC

## 2019-09-25 MED ORDER — PAROXETINE HCL 20 MG PO TABS: 20.0000 mg | ORAL_TABLET | Freq: Every day | ORAL | 0 refills | Status: DC

## 2019-09-25 MED ORDER — CLONAZEPAM 0.5 MG PO TABS: 0.5000 mg | ORAL_TABLET | Freq: Every day | ORAL | 0 refills | Status: DC

## 2019-09-25 NOTE — Telephone Encounter (Signed)
Okay to send in Lipitor 20

## 2019-09-28 MED ORDER — ATORVASTATIN CALCIUM 20 MG PO TABS: 20.0000 mg | ORAL_TABLET | Freq: Every day | ORAL | 1 refills | Status: DC

## 2019-09-28 NOTE — Telephone Encounter (Signed)
Rx for Atorvastatin 20mg  qd sent to [id number] R-1.

## 2019-09-28 NOTE — Addendum Note (Signed)
Addended by: Lamar Laundry on: 09/28/2019 08:48 AM   Modules accepted: Orders

## 2019-09-30 ENCOUNTER — Ambulatory Visit (INDEPENDENT_AMBULATORY_CARE_PROVIDER_SITE_OTHER): Payer: Medicare Other | Admitting: Psychiatry

## 2019-09-30 ENCOUNTER — Encounter (HOSPITAL_COMMUNITY): Payer: Self-pay | Admitting: Psychiatry

## 2019-09-30 ENCOUNTER — Other Ambulatory Visit: Payer: Self-pay

## 2019-09-30 DIAGNOSIS — F411 Generalized anxiety disorder: Secondary | ICD-10-CM | POA: Diagnosis not present

## 2019-09-30 DIAGNOSIS — F331 Major depressive disorder, recurrent, moderate: Secondary | ICD-10-CM

## 2019-09-30 MED ORDER — TRAZODONE HCL 50 MG PO TABS: ORAL_TABLET | ORAL | 2 refills | Status: DC

## 2019-09-30 MED ORDER — CLONAZEPAM 0.5 MG PO TABS: 0.5000 mg | ORAL_TABLET | Freq: Every day | ORAL | 2 refills | Status: DC

## 2019-09-30 MED ORDER — PAROXETINE HCL 20 MG PO TABS: 20.0000 mg | ORAL_TABLET | Freq: Every day | ORAL | 2 refills | Status: DC
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
## 2019-10-12 NOTE — Progress Notes (Signed)
10/13/2019 7:58 PM   Catherine [name] [birth year] [id number]  Referring provider: Danelle Berry, NP [address] [place],  [place] [address]  Chief Complaint  Patient presents with   Over Active Bladder    HPI: Catherine [name] is an 78 year old female with a history of hematuria and OAB who presents today for follow up.    History of hematuria (high risk) Non-smoker.  CTU 08/26/2018 revealed nonobstructing right renal calculus measures 3 mm.  Bosniak category 2 benign kidney lesion arises from the inferior pole of the right kidney.  Aortic Atherosclerosis.  Cystoscopy with Dr.  Bernardo Heater on 08/27/2018 was NED.  She has continued to have gross hematuria off and on.  Seen in the ED on 06/15/2019 and UA 0-5 RBC's, HCT 35.8% and Hgb 10.9. Contrast CT 06/15/2019 revealed small hyperdense lesion off the lower pole of the right kidney, likely hemorrhagic cyst, stable since prior study. Punctate nephrolithiasis in the mid and lower poles of the right kidney. No ureteral stones or hydronephrosis. Adrenal glands and urinary bladder unremarkable.  She denies any gross hematuria.  Her UA on 07/21/2019 was negative for AMH and today's CATH UA is negative for micro heme.   OAB Today, she is experiencing frequency, urgency, dysuria, incontinence, intermittency, hesitancy, and a weak urinary stream.  The patient is  experiencing urgency x 8 or more, frequency x 8 or more, is restricting fluids to avoid visits to the restroom, is engaging in toilet mapping, incontinence x 4-7 and nocturia x 4-7.   Her BP is 149/79.   Her PVR is 15 mL.  She states the Myrbetriq 25 mg is helping with her urinary symptoms.  She feels they may be a little bit worse because of possible urinary tract infection.  Right renal stones Contrast CT in 06/2019 revealed punctate right nephrolithiasis. No ureteral stones or Hydronephrosis.  No recent flank pain or passage of fragments.  KUB 07/21/2019 Nonobstructive bowel gas pattern.  No  calcifications concerning for nephrolithiasis or ureteral stones   She mentions today that she has been having burning and itching when she urinates.  She states this has been happening for the last 5 weeks.  She is applying vaginal estrogen cream 3 nights weekly.  Her CATH UA is negative.    PMH: Past Medical History:  Diagnosis Date   Acute colitis    Anemia    CAD (coronary artery disease)    a. heavy calcification of the entire LAD & mod eccentric mid LAD stenosis, estimated @ 70%, mild nonobs stenosis of RCA and LCx, severely calcified Ao valve with restricted valve mobility and 2+ AI     Cancer ([place]) 1981   breast   CHB (complete heart block) ([place]) 08/31/2015   St Jude Medical Assurity DR model [id number] (serial number [id number] )    Chronic diastolic congestive heart failure ([place])    a. echo 08/31/2015: EF 65-70%, nl WM, GR1DD, Ao valve stent bioprosthesis was present and functioning nl, no regurg, LA mildly dilated, PASP 46 mm Hg   Collagen vascular disease ([facility])    Colon polyps    Depression    Diabetes mellitus type II    Fibromyalgia    HTN (hypertension)    Hypercholesteremia    Hypothyroidism    IBS (irritable bowel syndrome)    Rectal bleeding    Rheumatoid arthritis(714.0)    S/P TAVR (transcatheter aortic valve replacement) 08/30/2015   26 mm Edwards Sapien 3 transcatheter heart valve placed  via open right transfemoral approach   Shortness of breath dyspnea    Stenosis of aortic valve    Thyroid disease     Surgical History: Past Surgical History:  Procedure Laterality Date   ABDOMINAL SURGERY     Intestinal surgery   CARDIAC SURGERY     CESAREAN SECTION     x 2   EP IMPLANTABLE DEVICE N/A 08/31/2015   Procedure: Pacemaker Implant;  Surgeon: Will Meredith Leeds, MD; [place] (serial number [id number] ); Laterality: Right   LEFT OOPHORECTOMY     MASTECTOMY Left    polyp      self inflicted chest wound      SPINE SURGERY  1981   lower back   TEE WITHOUT CARDIOVERSION N/A 08/30/2015   Procedure: TRANSESOPHAGEAL ECHOCARDIOGRAM (TEE);  Surgeon: Sherren Mocha, MD;  Location: [facility];  Service: Open Heart Surgery;  Laterality: N/A;   TOTAL ABDOMINAL HYSTERECTOMY     TRANSCATHETER AORTIC VALVE REPLACEMENT, TRANSFEMORAL N/A 08/30/2015   Procedure: TRANSCATHETER AORTIC VALVE REPLACEMENT, TRANSFEMORAL;  Surgeon: Sherren Mocha, MD;  Location: [facility];  Service: Open Heart Surgery;  Laterality: N/A;    Home Medications:  Allergies as of 10/13/2019      Reactions   Sulfa Antibiotics Hives   Latex Rash      Medication List       Accurate as of October 13, 2019 11:59 PM. If you have any questions, ask your nurse or doctor.        aspirin EC 81 MG tablet Take 81 mg by mouth daily.   atorvastatin 20 MG tablet Commonly known as: LIPITOR Take 1 tablet (20 mg total) by mouth daily.   budesonide 0.5 MG/2ML nebulizer solution Commonly known as: PULMICORT Take 2 mLs (0.5 mg total) by nebulization 2 (two) times daily.   clonazePAM 0.5 MG tablet Commonly known as: KLONOPIN Take 1 tablet (0.5 mg total) by mouth at bedtime.   furosemide 20 MG tablet Commonly known as: LASIX Take 1 tablet (20 mg total) by mouth daily.   HYDROcodone-acetaminophen 5-325 MG tablet Commonly known as: NORCO/VICODIN Take 1 tablet by mouth every 4 (four) hours as needed for moderate pain.   levothyroxine 100 MCG tablet Commonly known as: SYNTHROID Take 100 mcg by mouth daily before breakfast.   losartan 50 MG tablet Commonly known as: COZAAR Take 1 tablet (50 mg total) by mouth as directed. Take HALF tablet (25 mg) for one week then increase to whole tablet (50 mg) if BP remains high.   mirabegron ER 25 MG Tb24 tablet Commonly known as: MYRBETRIQ Take 1 tablet (25 mg total) by mouth daily.   nitroGLYCERIN 0.4 MG SL tablet Commonly known as: NITROSTAT Place 1 tablet (0.4 mg total) under the tongue every 5  (five) minutes as needed for chest pain.   PARoxetine 20 MG tablet Commonly known as: PAXIL Take 1 tablet (20 mg total) by mouth daily.   polyethylene glycol powder 17 GM/SCOOP powder Commonly known as: GLYCOLAX/MIRALAX Take 17 g by mouth daily as needed for moderate constipation.   potassium chloride 10 MEQ tablet Commonly known as: KLOR-CON TAKE 1 TABLET BY MOUTH EVERY DAY TAKE AN EXTRA TABLET ON DAYS WHEN YOU TAKE AN EXTRA LASIX   predniSONE 50 MG tablet Commonly known as: DELTASONE Take 1 tablet (50 mg total) by mouth daily with breakfast.   Premarin vaginal cream Generic drug: conjugated estrogens Apply 0.5mg  (pea-sized amount)  just inside the vaginal introitus with  a finger-tip on  Monday, Wednesday and Friday nights. Started by: Zara Council, PA-C   traZODone 50 MG tablet Commonly known as: DESYREL Take 1/2 to one tab at bed time.       Allergies:  Allergies  Allergen Reactions   Sulfa Antibiotics Hives   Latex Rash    Family History: Family History  Problem Relation Age of Onset   Depression Sister    Depression Sister    Depression Sister    Hypertension Mother    Lung cancer Sister    Stroke Sister    Hypertension Sister    Heart attack Neg Hx     Social History:  reports that she has never smoked. She has never used smokeless tobacco. She reports that she does not drink alcohol or use drugs.  ROS: UROLOGY Frequent Urination?: Yes Hard to postpone urination?: Yes Burning/pain with urination?: Yes Get up at night to urinate?: No Leakage of urine?: Yes Urine stream starts and stops?: Yes Trouble starting stream?: Yes Do you have to strain to urinate?: No Blood in urine?: No Urinary tract infection?: Yes Sexually transmitted disease?: No Injury to kidneys or bladder?: No Painful intercourse?: No Weak stream?: Yes Currently pregnant?: No Vaginal bleeding?: No Last menstrual period?: n  Gastrointestinal Nausea?:  Yes Vomiting?: No Indigestion/heartburn?: Yes Diarrhea?: Yes Constipation?: No  Constitutional Fever: No Night sweats?: No Weight loss?: No Fatigue?: Yes  Skin Skin rash/lesions?: No Itching?: Yes  Eyes Blurred vision?: Yes Double vision?: No  Ears/Nose/Throat Sore throat?: No Sinus problems?: No  Hematologic/Lymphatic Swollen glands?: No Easy bruising?: No  Cardiovascular Leg swelling?: Yes Chest pain?: Yes  Respiratory Cough?: No Shortness of breath?: Yes  Endocrine Excessive thirst?: Yes  Musculoskeletal Back pain?: Yes Joint pain?: Yes  Neurological Headaches?: Yes Dizziness?: No  Psychologic Depression?: No Anxiety?: Yes  Physical Exam: BP (!) 149/79    Pulse 93    Ht 5' 2.5" (1.588 m)    Wt 198 lb (89.8 kg)    BMI 35.64 kg/m   Constitutional:  Well nourished. Alert and oriented, No acute distress. HEENT: [place] AT, moist mucus membranes.  Trachea midline, no masses. Cardiovascular: No clubbing, cyanosis, or edema. Respiratory: Normal respiratory effort, no increased work of breathing. GI: Abdomen is soft, non tender, non distended, no abdominal masses. Liver and spleen not palpable.  No hernias appreciated.  Stool sample for occult testing is not indicated.   GU: No CVA tenderness.  No bladder fullness or masses.  Atrophic external genitalia, sparse pubic hair distribution, no lesions.  Normal urethral meatus, no lesions, no prolapse, no discharge.   No urethral masses, tenderness and/or tenderness. No bladder fullness, tenderness or masses. Pale vagina mucosa, poor estrogen effect, no discharge, no lesions, fair pelvic support, grade II cystocele and no rectocele noted.  No cervical motion tenderness.  Uterus is freely mobile and non-fixed.  Cervix, uterus and adnexa are surgically absent.   Anus and perineum are without rashes or lesions.    Skin: No rashes, bruises or suspicious lesions. Lymph: No inguinal adenopathy. Neurologic: Grossly intact, no  focal deficits, moving all 4 extremities. Psychiatric: Normal mood and affect.   Laboratory Data: Lab Results  Component Value Date   WBC 9.5 06/15/2019   HGB 10.9 (L) 06/15/2019   HCT 35.8 (L) 06/15/2019   MCV 89.9 06/15/2019   PLT 312 06/15/2019    Lab Results  Component Value Date   CREATININE 0.94 06/15/2019    No results found for: PSA  No  results found for: TESTOSTERONE  Lab Results  Component Value Date   HGBA1C 6.6 (H) 03/10/2017    Lab Results  Component Value Date   TSH 7.993 (H) 03/10/2017       Component Value Date/Time   CHOL 186 03/10/2017 0424   HDL 43 03/10/2017 0424   CHOLHDL 4.3 03/10/2017 0424   VLDL 29 03/10/2017 0424   LDLCALC 114 (H) 03/10/2017 0424    Lab Results  Component Value Date   AST 24 06/15/2019   Lab Results  Component Value Date   ALT 18 06/15/2019   No components found for: ALKALINEPHOPHATASE No components found for: BILIRUBINTOTAL  No results found for: ESTRADIOL  Urinalysis Component     Latest Ref Rng & Units 10/13/2019  Specific Gravity, UA     1.005 - 1.030 1.020  pH, UA     5.0 - 7.5 7.0  Color, UA     Yellow Yellow  Appearance Ur     Clear Clear  Leukocytes,UA     Negative Negative  Protein,UA     Negative/Trace Trace (A)  Glucose, UA     Negative Negative  Ketones, UA     Negative Negative  RBC, UA     Negative Negative  Bilirubin, UA     Negative Negative  Urobilinogen, Ur     0.2 - 1.0 mg/dL 0.2  Nitrite, UA     Negative Negative  Microscopic Examination      See below:   Component     Latest Ref Rng & Units 10/13/2019          WBC, UA     0 - 5 /hpf 0-5  RBC     0 - 2 /hpf None seen  Epithelial Cells (non renal)     0 - 10 /hpf None seen  Bacteria, UA     None seen/Few None seen     I have reviewed the labs.   Pertinent Imaging: Results for SUKHMAN, OKRAY (MRN [id number]) as of 10/13/2019 14:47  Ref. Range 10/13/2019 14:32  Scan Result Unknown 15   Assessment &  Plan:    1. History of hematuria Hematuria work up completed in 08/2018  - findings positive for punctate right renal stones and cysts  No report of gross hematuria  UA today negative for AMH  RTC in one year for UA - patient to report any gross hematuria in the interim    2. OAB Patient states that the Myrbetriq 25 mg to provide a benefit She will continue the medication  3. Right renal calculus No intervention warranted at this time  4.  Vaginal atrophy Upon further questioning it is discovered that patient is using over-the-counter vaginal lubricants and not vaginal estrogen cream I have sent a prescription for Premarin vaginal cream and given her sample of the Premarin vaginal cream and encouraged her to use the Premarin vaginal cream as it has estrogen in it and it is crucial to restoring her vaginal lining to a healthier state.  I also explained that her burning and itching when she urinates is likely due to the vaginal atrophy.  Return in about 8 weeks (around 12/08/2019) for exam.  These notes generated with voice recognition software. I apologize for typographical errors.  Zara Council, PA-C  [facility] [address]  [place], [place] [address] [phone]

## 2019-10-13 ENCOUNTER — Encounter: Payer: Self-pay | Admitting: Urology

## 2019-10-13 ENCOUNTER — Other Ambulatory Visit: Payer: Self-pay

## 2019-10-13 ENCOUNTER — Ambulatory Visit: Payer: Medicare Other | Admitting: Urology

## 2019-10-13 VITALS — BP 149/79 | HR 93 | Ht 62.5 in | Wt 198.0 lb

## 2019-10-13 DIAGNOSIS — N952 Postmenopausal atrophic vaginitis: Secondary | ICD-10-CM | POA: Diagnosis not present

## 2019-10-13 DIAGNOSIS — N3281 Overactive bladder: Secondary | ICD-10-CM | POA: Diagnosis not present

## 2019-10-13 DIAGNOSIS — N200 Calculus of kidney: Secondary | ICD-10-CM

## 2019-10-13 DIAGNOSIS — Z87448 Personal history of other diseases of urinary system: Secondary | ICD-10-CM | POA: Diagnosis not present

## 2019-10-13 LAB — MICROSCOPIC EXAMINATION
Bacteria, UA: NONE SEEN
Epithelial Cells (non renal): NONE SEEN /hpf (ref 0–10)
RBC, Urine: NONE SEEN /hpf (ref 0–2)

## 2019-10-13 LAB — URINALYSIS, COMPLETE
Bilirubin, UA: NEGATIVE
Glucose, UA: NEGATIVE
Ketones, UA: NEGATIVE
Leukocytes,UA: NEGATIVE
Nitrite, UA: NEGATIVE
RBC, UA: NEGATIVE
Specific Gravity, UA: 1.02 (ref 1.005–1.030)
Urobilinogen, Ur: 0.2 mg/dL (ref 0.2–1.0)
pH, UA: 7 (ref 5.0–7.5)

## 2019-10-13 LAB — BLADDER SCAN AMB NON-IMAGING: Scan Result: 15

## 2019-10-13 MED ORDER — PREMARIN 0.625 MG/GM VA CREA: TOPICAL_CREAM | VAGINAL | 12 refills | Status: DC

## 2019-10-15 ENCOUNTER — Ambulatory Visit (INDEPENDENT_AMBULATORY_CARE_PROVIDER_SITE_OTHER): Payer: Medicare Other | Admitting: *Deleted

## 2019-10-15 DIAGNOSIS — I442 Atrioventricular block, complete: Secondary | ICD-10-CM

## 2019-10-15 DIAGNOSIS — I639 Cerebral infarction, unspecified: Secondary | ICD-10-CM

## 2019-10-15 LAB — CUP PACEART REMOTE DEVICE CHECK
Battery Remaining Longevity: 110 mo
Battery Remaining Percentage: 95.5 %
Battery Voltage: 2.96 V
Brady Statistic AP VP Percent: 2.3 %
Brady Statistic AP VS Percent: 4.6 %
Brady Statistic AS VP Percent: 38 %
Brady Statistic AS VS Percent: 55 %
Brady Statistic RA Percent Paced: 6.7 %
Brady Statistic RV Percent Paced: 40 %
Date Time Interrogation Session: 20201112070013
Implantable Lead Implant Date: 20160928
Implantable Lead Implant Date: 20160928
Implantable Lead Location: 753859
Implantable Lead Location: 753860
Implantable Pulse Generator Implant Date: 20160928
Lead Channel Impedance Value: 360 Ohm
Lead Channel Impedance Value: 440 Ohm
Lead Channel Pacing Threshold Amplitude: 0.75 V
Lead Channel Pacing Threshold Amplitude: 0.75 V
Lead Channel Pacing Threshold Pulse Width: 0.4 ms
Lead Channel Pacing Threshold Pulse Width: 0.4 ms
Lead Channel Sensing Intrinsic Amplitude: 2.7 mV
Lead Channel Sensing Intrinsic Amplitude: 8.6 mV
Lead Channel Setting Pacing Amplitude: 2 V
Lead Channel Setting Pacing Amplitude: 2.5 V
Lead Channel Setting Pacing Pulse Width: 0.4 ms
Lead Channel Setting Sensing Sensitivity: 2.5 mV
Pulse Gen Model: 2240
Pulse Gen Serial Number: 7779295

## 2019-11-06 NOTE — Progress Notes (Signed)
Remote pacemaker transmission.   

## 2019-11-10 ENCOUNTER — Emergency Department: Payer: Medicare Other

## 2019-11-10 ENCOUNTER — Other Ambulatory Visit: Payer: Self-pay

## 2019-11-10 ENCOUNTER — Encounter: Payer: Self-pay | Admitting: Intensive Care

## 2019-11-10 ENCOUNTER — Emergency Department
Admission: EM | Admit: 2019-11-10 | Discharge: 2019-11-10 | Disposition: A | Discharge status: Home / Self Care | Payer: Medicare Other | Attending: Emergency Medicine | Admitting: Emergency Medicine

## 2019-11-10 DIAGNOSIS — I2510 Atherosclerotic heart disease of native coronary artery without angina pectoris: Secondary | ICD-10-CM | POA: Insufficient documentation

## 2019-11-10 DIAGNOSIS — I5032 Chronic diastolic (congestive) heart failure: Secondary | ICD-10-CM | POA: Insufficient documentation

## 2019-11-10 DIAGNOSIS — R0602 Shortness of breath: Secondary | ICD-10-CM | POA: Insufficient documentation

## 2019-11-10 DIAGNOSIS — E039 Hypothyroidism, unspecified: Secondary | ICD-10-CM | POA: Insufficient documentation

## 2019-11-10 DIAGNOSIS — Z79899 Other long term (current) drug therapy: Secondary | ICD-10-CM | POA: Insufficient documentation

## 2019-11-10 DIAGNOSIS — R110 Nausea: Secondary | ICD-10-CM | POA: Insufficient documentation

## 2019-11-10 DIAGNOSIS — Z20828 Contact with and (suspected) exposure to other viral communicable diseases: Secondary | ICD-10-CM | POA: Diagnosis not present

## 2019-11-10 DIAGNOSIS — E119 Type 2 diabetes mellitus without complications: Secondary | ICD-10-CM | POA: Insufficient documentation

## 2019-11-10 DIAGNOSIS — I110 Hypertensive heart disease with heart failure: Secondary | ICD-10-CM | POA: Diagnosis not present

## 2019-11-10 DIAGNOSIS — Z8673 Personal history of transient ischemic attack (TIA), and cerebral infarction without residual deficits: Secondary | ICD-10-CM | POA: Diagnosis not present

## 2019-11-10 DIAGNOSIS — Z7982 Long term (current) use of aspirin: Secondary | ICD-10-CM | POA: Insufficient documentation

## 2019-11-10 DIAGNOSIS — Z91040 Latex allergy status: Secondary | ICD-10-CM | POA: Diagnosis not present

## 2019-11-10 DIAGNOSIS — Z950 Presence of cardiac pacemaker: Secondary | ICD-10-CM | POA: Diagnosis not present

## 2019-11-10 LAB — URINALYSIS, COMPLETE (UACMP) WITH MICROSCOPIC
Bilirubin Urine: NEGATIVE
Glucose, UA: 50 mg/dL — AB
Hgb urine dipstick: NEGATIVE
Ketones, ur: NEGATIVE mg/dL
Nitrite: NEGATIVE
Protein, ur: 30 mg/dL — AB
Specific Gravity, Urine: 1.013 (ref 1.005–1.030)
pH: 7 (ref 5.0–8.0)

## 2019-11-10 LAB — BASIC METABOLIC PANEL
Anion gap: 10 (ref 5–15)
BUN: 19 mg/dL (ref 8–23)
CO2: 26 mmol/L (ref 22–32)
Calcium: 9 mg/dL (ref 8.9–10.3)
Chloride: 97 mmol/L — ABNORMAL LOW (ref 98–111)
Creatinine, Ser: 0.89 mg/dL (ref 0.44–1.00)
GFR calc Af Amer: >60 mL/min (ref >60)
GFR calc non Af Amer: >60 mL/min (ref >60)
Glucose, Bld: 217 mg/dL — ABNORMAL HIGH (ref 70–99)
Potassium: 4.3 mmol/L (ref 3.5–5.1)
Sodium: 133 mmol/L — ABNORMAL LOW (ref 135–145)

## 2019-11-10 LAB — CBC
HCT: 32.6 % — ABNORMAL LOW (ref 36.0–46.0)
Hemoglobin: 10.1 g/dL — ABNORMAL LOW (ref 12.0–15.0)
MCH: 27 pg (ref 26.0–34.0)
MCHC: 31 g/dL (ref 30.0–36.0)
MCV: 87.2 fL (ref 80.0–100.0)
Platelets: 277 10*3/uL (ref 150–400)
RBC: 3.74 MIL/uL — ABNORMAL LOW (ref 3.87–5.11)
RDW: 16.2 % — ABNORMAL HIGH (ref 11.5–15.5)
WBC: 10.9 10*3/uL — ABNORMAL HIGH (ref 4.0–10.5)
nRBC: 0 % (ref 0.0–0.2)

## 2019-11-10 LAB — TROPONIN I (HIGH SENSITIVITY): Troponin I (High Sensitivity): 19 ng/L — ABNORMAL HIGH (ref <18)

## 2019-11-10 LAB — PROTIME-INR
INR: 1 (ref 0.8–1.2)
Prothrombin Time: 13.1 seconds (ref 11.4–15.2)

## 2019-11-10 LAB — POC SARS CORONAVIRUS 2 AG: SARS Coronavirus 2 Ag: NEGATIVE

## 2019-11-10 MED ORDER — ONDANSETRON 4 MG PO TBDP: 4.0000 mg | ORAL_TABLET | Freq: Three times a day (TID) | ORAL | 0 refills | Status: DC | PRN

## 2019-11-10 MED ORDER — ONDANSETRON 4 MG PO TBDP
4.0000 mg | ORAL_TABLET | Freq: Once | ORAL | Status: AC
  Administered 2019-11-10: 4 mg via ORAL
  Filled 2019-11-10: qty 1

## 2019-11-10 NOTE — ED Notes (Signed)
This tech gave the pt graham crackers and diet ginger ale at bedside.

## 2019-11-10 NOTE — ED Provider Notes (Signed)
[facility] Emergency Department Provider Note   ____________________________________________    I have reviewed the triage vital signs and the nursing notes.   HISTORY  Chief Complaint Nausea, abdominal discomfort   HPI Catherine [name] is a 78 y.o. female who presents with complaints of diffuse abdominal discomfort but primarily is complaining of nausea x 2 days. She has had a couple of episodes of nbnb vomiting. No fevers/chills. No myalgias. Difficulty tolerating PO's x 1 day. Has not taken anything for this. Currently feels mildly nauseated but no abd pain. No dysuria   Past Medical History:  Diagnosis Date  . Acute colitis   . Anemia   . CAD (coronary artery disease)    a. heavy calcification of the entire LAD & mod eccentric mid LAD stenosis, estimated @ 70%, mild nonobs stenosis of RCA and LCx, severely calcified Ao valve with restricted valve mobility and 2+ AI    . Cancer [facility]) 1981   breast  . CHB (complete heart block) ([place]) 08/31/2015   [place] DR model [id number] (serial number [id number] )   . Chronic diastolic congestive heart failure ([place])    a. echo 08/31/2015: EF 65-70%, nl WM, GR1DD, Ao valve stent bioprosthesis was present and functioning nl, no regurg, LA mildly dilated, PASP 46 mm Hg  . Collagen vascular disease ([place])   . Colon polyps   . Depression   . Diabetes mellitus type II   . Fibromyalgia   . HTN (hypertension)   . Hypercholesteremia   . Hypothyroidism   . IBS (irritable bowel syndrome)   . Rectal bleeding   . Rheumatoid arthritis(714.0)   . S/P TAVR (transcatheter aortic valve replacement) 08/30/2015   26 mm Edwards Sapien 3 transcatheter heart valve placed via open right transfemoral approach  . Shortness of breath dyspnea   . Stenosis of aortic valve   . Thyroid disease     Patient Active Problem List   Diagnosis Date Noted  . Polypharmacy 03/12/2019  . Generalized weakness   . Shortness of  breath   . Near syncope 10/24/2018  . Thyroid disease   . Aortic valve stenosis   . Rectal bleeding   . Hyperlipidemia   . Hypertension   . Depression   . Colon polyps   . Collagen vascular disease ([place])   . CAD (coronary artery disease)   . Anemia   . Acute colitis   . Abdominal pain   . BPPV (benign paroxysmal positional vertigo), bilateral   . CVA (cerebral vascular accident) ([place])   . TIA (transient ischemic attack) 03/09/2017  . Dizziness   . Prolonged Q-T interval on ECG 03/13/2016  . Suicidal ideation 03/06/2016  . Severe recurrent major depression without psychotic features ([place])   . Sleep disturbance 02/28/2016  . Chest pain 09/24/2015  . Hypokalemia 09/07/2015  . IBS (irritable bowel syndrome)   . Cancer ([place])   . Pneumothorax 09/05/2015  . Complete heart block ([place])   . CHB (complete heart block) ([place]) 08/31/2015  . Status post transcatheter aortic valve replacement (TAVR) using bioprosthesis 08/30/2015  . Type II diabetes mellitus ([place])   . Essential hypertension   . Chronic diastolic congestive heart failure ([place])   . Rheumatoid arthritis ([place])   . Hypothyroidism   . Fibromyalgia   . Aortic valve stenosis, critical 08/22/2015  . MDD (major depressive disorder) ([place]) 05/26/2012    Past Surgical History:  Procedure Laterality Date  . ABDOMINAL SURGERY  Intestinal surgery  . CARDIAC SURGERY    . CESAREAN SECTION     x 2  . EP IMPLANTABLE DEVICE N/A 08/31/2015   Procedure: Pacemaker Implant;  Surgeon: Will Meredith Leeds, MD; [place] (serial number [id number] ); Laterality: Right  . LEFT OOPHORECTOMY    . MASTECTOMY Left   . polyp     . self inflicted chest wound    . Twining   lower back  . TEE WITHOUT CARDIOVERSION N/A 08/30/2015   Procedure: TRANSESOPHAGEAL ECHOCARDIOGRAM (TEE);  Surgeon: Sherren Mocha, MD;  Location: [place];  Service: Open Heart Surgery;  Laterality: N/A;  . TOTAL ABDOMINAL HYSTERECTOMY     . TRANSCATHETER AORTIC VALVE REPLACEMENT, TRANSFEMORAL N/A 08/30/2015   Procedure: TRANSCATHETER AORTIC VALVE REPLACEMENT, TRANSFEMORAL;  Surgeon: Sherren Mocha, MD;  Location: [place];  Service: Open Heart Surgery;  Laterality: N/A;    Prior to Admission medications   Medication Sig Start Date End Date Taking? Authorizing Provider  aspirin EC 81 MG tablet Take 81 mg by mouth daily.    [provider]  atorvastatin (LIPITOR) 20 MG tablet Take 1 tablet (20 mg total) by mouth daily. 09/28/19   Minna Merritts, MD  budesonide (PULMICORT) 0.5 MG/2ML nebulizer solution Take 2 mLs (0.5 mg total) by nebulization 2 (two) times daily. 10/29/18 11/28/18  Mayo, Pete Pelt, MD  clonazePAM (KLONOPIN) 0.5 MG tablet Take 1 tablet (0.5 mg total) by mouth at bedtime. 09/30/19   Kathlee Nations, MD  conjugated estrogens (PREMARIN) vaginal cream Apply 0.5mg  (pea-sized amount)  just inside the vaginal introitus with a finger-tip on  Monday, Wednesday and Friday nights. 10/13/19   Zara Council A, PA-C  furosemide (LASIX) 20 MG tablet Take 1 tablet (20 mg total) by mouth daily. Patient not taking: Reported on 07/21/2019 04/28/19 07/27/19  Minna Merritts, MD  HYDROcodone-acetaminophen (NORCO/VICODIN) 5-325 MG tablet Take 1 tablet by mouth every 4 (four) hours as needed for moderate pain. 09/12/19   Cuthriell, Charline Bills, PA-C  levothyroxine (SYNTHROID, LEVOTHROID) 100 MCG tablet Take 100 mcg by mouth daily before breakfast.     [provider]  losartan (COZAAR) 50 MG tablet Take 1 tablet (50 mg total) by mouth as directed. Take HALF tablet (25 mg) for one week then increase to whole tablet (50 mg) if BP remains high. 03/12/19 06/10/19  Minna Merritts, MD  mirabegron ER (MYRBETRIQ) 25 MG TB24 tablet Take 1 tablet (25 mg total) by mouth daily. 07/21/19   McGowan, Larene [place] A, PA-C  nitroGLYCERIN (NITROSTAT) 0.4 MG SL tablet Place 1 tablet (0.4 mg total) under the tongue every 5 (five) minutes as needed  for chest pain. 03/02/19   Minna Merritts, MD  ondansetron (ZOFRAN ODT) 4 MG disintegrating tablet Take 1 tablet (4 mg total) by mouth every 8 (eight) hours as needed. 11/10/19   Lavonia Drafts, MD  PARoxetine (PAXIL) 20 MG tablet Take 1 tablet (20 mg total) by mouth daily. 09/30/19 09/29/20  Arfeen, Arlyce Harman, MD  polyethylene glycol powder (GLYCOLAX/MIRALAX) 17 GM/SCOOP powder Take 17 g by mouth daily as needed for moderate constipation. 06/15/19   Harvest Dark, MD  potassium chloride (KLOR-CON) 10 MEQ tablet TAKE 1 TABLET BY MOUTH EVERY DAY TAKE AN EXTRA TABLET ON DAYS WHEN YOU TAKE AN EXTRA LASIX 09/07/19   Rise Mu, PA-C  predniSONE (DELTASONE) 50 MG tablet Take 1 tablet (50 mg total) by mouth daily with breakfast. 09/12/19  Cuthriell, Charline Bills, PA-C  traZODone (DESYREL) 50 MG tablet Take 1/2 to one tab at bed time. 09/30/19   Arfeen, Arlyce Harman, MD     Allergies Sulfa antibiotics and Latex  Family History  Problem Relation Age of Onset  . Depression Sister   . Depression Sister   . Depression Sister   . Hypertension Mother   . Lung cancer Sister   . Stroke Sister   . Hypertension Sister   . Heart attack Neg Hx     Social History Social History   Tobacco Use  . Smoking status: Never Smoker  . Smokeless tobacco: Never Used  Substance Use Topics  . Alcohol use: No    Alcohol/week: 0.0 standard drinks  . Drug use: No    Review of Systems  Constitutional: No fever/chills Eyes: No visual changes.  ENT: No sore throat. Cardiovascular: Denies chest pain. Respiratory: Denies shortness of breath. Gastrointestinal:as above Genitourinary: Negative for dysuria. Musculoskeletal: Negative for back pain. Skin: Negative for rash. Neurological: Negative for headaches    ____________________________________________   PHYSICAL EXAM:  VITAL SIGNS: ED Triage Vitals  Enc Vitals Group     BP 11/10/19 1036 (!) 155/73     Pulse Rate 11/10/19 1036 96     Resp 11/10/19 1036  (!) 28     Temp 11/10/19 1036 98.7 F (37.1 C)     Temp Source 11/10/19 1036 Oral     SpO2 11/10/19 1036 98 %     Weight 11/10/19 1037 88.9 kg (196 lb)     Height 11/10/19 1037 1.6 m (5\' 3" )     Head Circumference --      Peak Flow --      Pain Score 11/10/19 1037 8     Pain Loc --      Pain Edu? --      Excl. in [place]? --     Constitutional: Alert and oriented.   Nose: No congestion/rhinnorhea. Mouth/Throat: Mucous membranes are moist.    Cardiovascular: Normal rate, regular rhythm. Grossly normal heart sounds.  Good peripheral circulation. Respiratory: Normal respiratory effort.  No retractions. Lungs CTAB. Gastrointestinal: Soft and nontender. No distention. Reassuring exam Musculoskeletal: No lower extremity tenderness nor edema.  Warm and well perfused Neurologic:  Normal speech and language. No gross focal neurologic deficits are appreciated.  Skin:  Skin is warm, dry and intact. No rash noted. Psychiatric: Mood and affect are normal. Speech and behavior are normal.  ____________________________________________   LABS (all labs ordered are listed, but only abnormal results are displayed)  Labs Reviewed  BASIC METABOLIC PANEL - Abnormal; Notable for the following components:      Result Value   Sodium 133 (*)    Chloride 97 (*)    Glucose, Bld 217 (*)    All other components within normal limits  CBC - Abnormal; Notable for the following components:   WBC 10.9 (*)    RBC 3.74 (*)    Hemoglobin 10.1 (*)    HCT 32.6 (*)    RDW 16.2 (*)    All other components within normal limits  URINALYSIS, COMPLETE (UACMP) WITH MICROSCOPIC - Abnormal; Notable for the following components:   Color, Urine YELLOW (*)    APPearance CLEAR (*)    Glucose, UA 50 (*)    Protein, ur 30 (*)    Leukocytes,Ua TRACE (*)    Bacteria, UA RARE (*)    All other components within normal limits  TROPONIN I (HIGH SENSITIVITY) -  Abnormal; Notable for the following components:   Troponin I (High  Sensitivity) 19 (*)    All other components within normal limits  PROTIME-INR  POC SARS CORONAVIRUS 2 AG -  ED  POC SARS CORONAVIRUS 2 AG   ____________________________________________  EKG  ED ECG REPORT I, Lavonia Drafts, the attending physician, personally viewed and interpreted this ECG.  Date: 11/10/2019  Rhythm: paced rhythm QRS Axis:abnormal Intervals: abnormal ST/T Wave abnormalities: abnormal Narrative Interpretation: no evidence of acute ischemia  ____________________________________________  RADIOLOGY  cxr mild pulm congestion ____________________________________________   PROCEDURES  Procedure(s) performed: No  Procedures   Critical Care performed: No ____________________________________________   INITIAL IMPRESSION / ASSESSMENT AND PLAN / ED COURSE  Pertinent labs & imaging results that were available during my care of the patient were reviewed by me and considered in my medical decision making (see chart for details).  Patient well appearing with reassuring exam. Lab work is unremarkable. No abd ttp. No clear covid sx's. Will treat with po zofran and po challenge.  Patient feeling much better after zofran and eating crackers and having ginger ale. No abdominal pain. No vomiting  Appropriate for d/c with strict return precautions    ____________________________________________   FINAL CLINICAL IMPRESSION(S) / ED DIAGNOSES  Final diagnoses:  Nausea        Note:  This document was prepared using Dragon voice recognition software and may include unintentional dictation errors.   Lavonia Drafts, MD 11/10/19 [phone]

## 2019-11-10 NOTE — ED Triage Notes (Signed)
Patient c/o abd pain, nausea, SOB, weakness, and insomnia since Saturday. HX CHF and CKD

## 2019-11-10 NOTE — ED Notes (Signed)
Patient transported to X-ray 

## 2019-11-12 ENCOUNTER — Other Ambulatory Visit: Payer: Self-pay

## 2019-11-12 ENCOUNTER — Emergency Department: Payer: Medicare Other

## 2019-11-12 ENCOUNTER — Encounter: Payer: Self-pay | Admitting: Emergency Medicine

## 2019-11-12 ENCOUNTER — Emergency Department
Admission: EM | Admit: 2019-11-12 | Discharge: 2019-11-12 | Disposition: A | Discharge status: Home / Self Care | Payer: Medicare Other | Attending: Emergency Medicine | Admitting: Emergency Medicine

## 2019-11-12 DIAGNOSIS — Z950 Presence of cardiac pacemaker: Secondary | ICD-10-CM | POA: Diagnosis not present

## 2019-11-12 DIAGNOSIS — Z7982 Long term (current) use of aspirin: Secondary | ICD-10-CM | POA: Diagnosis not present

## 2019-11-12 DIAGNOSIS — I2510 Atherosclerotic heart disease of native coronary artery without angina pectoris: Secondary | ICD-10-CM | POA: Diagnosis not present

## 2019-11-12 DIAGNOSIS — Z853 Personal history of malignant neoplasm of breast: Secondary | ICD-10-CM | POA: Diagnosis not present

## 2019-11-12 DIAGNOSIS — I5032 Chronic diastolic (congestive) heart failure: Secondary | ICD-10-CM | POA: Insufficient documentation

## 2019-11-12 DIAGNOSIS — Z8673 Personal history of transient ischemic attack (TIA), and cerebral infarction without residual deficits: Secondary | ICD-10-CM | POA: Diagnosis not present

## 2019-11-12 DIAGNOSIS — R0602 Shortness of breath: Secondary | ICD-10-CM | POA: Diagnosis not present

## 2019-11-12 DIAGNOSIS — R8271 Bacteriuria: Secondary | ICD-10-CM | POA: Diagnosis not present

## 2019-11-12 DIAGNOSIS — R109 Unspecified abdominal pain: Secondary | ICD-10-CM | POA: Diagnosis present

## 2019-11-12 DIAGNOSIS — E119 Type 2 diabetes mellitus without complications: Secondary | ICD-10-CM | POA: Diagnosis not present

## 2019-11-12 DIAGNOSIS — I5021 Acute systolic (congestive) heart failure: Secondary | ICD-10-CM

## 2019-11-12 DIAGNOSIS — R1030 Lower abdominal pain, unspecified: Secondary | ICD-10-CM | POA: Insufficient documentation

## 2019-11-12 DIAGNOSIS — R112 Nausea with vomiting, unspecified: Secondary | ICD-10-CM | POA: Diagnosis not present

## 2019-11-12 DIAGNOSIS — Z79899 Other long term (current) drug therapy: Secondary | ICD-10-CM | POA: Insufficient documentation

## 2019-11-12 DIAGNOSIS — I110 Hypertensive heart disease with heart failure: Secondary | ICD-10-CM | POA: Insufficient documentation

## 2019-11-12 LAB — COMPREHENSIVE METABOLIC PANEL
ALT: 29 U/L (ref 0–44)
AST: 23 U/L (ref 15–41)
Albumin: 3.5 g/dL (ref 3.5–5.0)
Alkaline Phosphatase: 92 U/L (ref 38–126)
Anion gap: 10 (ref 5–15)
BUN: 21 mg/dL (ref 8–23)
CO2: 28 mmol/L (ref 22–32)
Calcium: 9.1 mg/dL (ref 8.9–10.3)
Chloride: 96 mmol/L — ABNORMAL LOW (ref 98–111)
Creatinine, Ser: 1.05 mg/dL — ABNORMAL HIGH (ref 0.44–1.00)
GFR calc Af Amer: 58 mL/min — ABNORMAL LOW (ref >60)
GFR calc non Af Amer: 50 mL/min — ABNORMAL LOW (ref >60)
Glucose, Bld: 198 mg/dL — ABNORMAL HIGH (ref 70–99)
Potassium: 4.2 mmol/L (ref 3.5–5.1)
Sodium: 134 mmol/L — ABNORMAL LOW (ref 135–145)
Total Bilirubin: 0.4 mg/dL (ref 0.3–1.2)
Total Protein: 7.2 g/dL (ref 6.5–8.1)

## 2019-11-12 LAB — CBC WITH DIFFERENTIAL/PLATELET
Abs Immature Granulocytes: 0.05 10*3/uL (ref 0.00–0.07)
Basophils Absolute: 0.1 10*3/uL (ref 0.0–0.1)
Basophils Relative: 1 %
Eosinophils Absolute: 0.6 10*3/uL — ABNORMAL HIGH (ref 0.0–0.5)
Eosinophils Relative: 6 %
HCT: 32.6 % — ABNORMAL LOW (ref 36.0–46.0)
Hemoglobin: 9.9 g/dL — ABNORMAL LOW (ref 12.0–15.0)
Immature Granulocytes: 1 %
Lymphocytes Relative: 22 %
Lymphs Abs: 2.1 10*3/uL (ref 0.7–4.0)
MCH: 26.4 pg (ref 26.0–34.0)
MCHC: 30.4 g/dL (ref 30.0–36.0)
MCV: 86.9 fL (ref 80.0–100.0)
Monocytes Absolute: 1.1 10*3/uL — ABNORMAL HIGH (ref 0.1–1.0)
Monocytes Relative: 11 %
Neutro Abs: 5.6 10*3/uL (ref 1.7–7.7)
Neutrophils Relative %: 59 %
Platelets: 329 10*3/uL (ref 150–400)
RBC: 3.75 MIL/uL — ABNORMAL LOW (ref 3.87–5.11)
RDW: 16.2 % — ABNORMAL HIGH (ref 11.5–15.5)
WBC: 9.5 10*3/uL (ref 4.0–10.5)
nRBC: 0 % (ref 0.0–0.2)

## 2019-11-12 LAB — URINALYSIS, COMPLETE (UACMP) WITH MICROSCOPIC
Bilirubin Urine: NEGATIVE
Glucose, UA: NEGATIVE mg/dL
Hgb urine dipstick: NEGATIVE
Ketones, ur: NEGATIVE mg/dL
Nitrite: NEGATIVE
Protein, ur: NEGATIVE mg/dL
Specific Gravity, Urine: 1.011 (ref 1.005–1.030)
pH: 5 (ref 5.0–8.0)

## 2019-11-12 LAB — TROPONIN I (HIGH SENSITIVITY): Troponin I (High Sensitivity): 16 ng/L (ref <18)

## 2019-11-12 LAB — BRAIN NATRIURETIC PEPTIDE: B Natriuretic Peptide: 205 pg/mL — ABNORMAL HIGH (ref 0.0–100.0)

## 2019-11-12 LAB — LIPASE, BLOOD: Lipase: 26 U/L (ref 11–51)

## 2019-11-12 MED ORDER — ONDANSETRON HCL 4 MG/2ML IJ SOLN
4.0000 mg | Freq: Once | INTRAMUSCULAR | Status: AC
  Administered 2019-11-12: 4 mg via INTRAVENOUS
  Filled 2019-11-12: qty 2

## 2019-11-12 MED ORDER — CEPHALEXIN 500 MG PO CAPS: 500.0000 mg | ORAL_CAPSULE | Freq: Three times a day (TID) | ORAL | 0 refills | Status: AC

## 2019-11-12 MED ORDER — FENTANYL CITRATE (PF) 100 MCG/2ML IJ SOLN
50.0000 ug | Freq: Once | INTRAMUSCULAR | Status: AC
  Administered 2019-11-12: 50 ug via INTRAVENOUS
  Filled 2019-11-12: qty 2

## 2019-11-12 MED ORDER — SODIUM CHLORIDE 0.9 % IV SOLN
1.0000 g | Freq: Once | INTRAVENOUS | Status: AC
  Administered 2019-11-12: 10:00:00 1 g via INTRAVENOUS
  Filled 2019-11-12: qty 10

## 2019-11-12 MED ORDER — IOHEXOL 350 MG/ML SOLN
75.0000 mL | Freq: Once | INTRAVENOUS | Status: AC | PRN
  Administered 2019-11-12: 08:00:00 75 mL via INTRAVENOUS

## 2019-11-12 NOTE — ED Provider Notes (Signed)
Emergency Ultrasound Study:   Angiocath insertion Performed by: Evonnie Pat Consent: Verbal consent/emergent consent obtained. Risks and benefits: risks, benefits and alternatives were discussed Immediately prior to procedure the correct patient, procedure, equipment, support staff and site/side marked as needed.  Indication: difficult IV access Preparation: Patient was prepped and draped in the usual sterile fashion. Sterile gel was used for this procedure and the ultrasound probe was sterilized prior to use. Vein Location: Right forearm vein was visualized during assessment for potential access sites and was found to be patent/ easily compressed with linear ultrasound.  The needle was visualized with real-time ultrasound and guided into the vein. Gauge: 20  Image saved and stored.  Normal blood return.   Patient tolerance: Patient tolerated the procedure well with no immediate complications.        Duffy Bruce, MD 11/12/19 [phone]

## 2019-11-12 NOTE — ED Triage Notes (Signed)
Pt to triage via w/c with no distress noted, mask in place; pt reports lower abd pain accomp by nausea since yesterday; seen for same recently

## 2019-11-12 NOTE — ED Notes (Signed)
Pt verbalized understanding of discharge instructions. NAD at this time. 

## 2019-11-12 NOTE — ED Notes (Signed)
MD at the bedside for pt evaluation  

## 2019-11-12 NOTE — ED Notes (Signed)
Unable to obtain an IV, 3 attempts made. IV consult put in, and EDP notified

## 2019-11-12 NOTE — Discharge Instructions (Addendum)
You should increase your Lasix to the 20 mg twice a day for the next 3 days to help with the swelling in your legs.    Follow-up with your primary care doctor early this coming week, or in 3-5 days  Take the full course of antibiotic as prescribed

## 2019-11-12 NOTE — ED Provider Notes (Signed)
Patient here with lower abd pain, mild SOB and leg swelling. S/p extensive work-up with reassuring labs. Reviewed labs, imaging. UA does show +bacteriuria and pt reports +dysuria, frequency. While not convincing for severe UTI, will tx empirically given her age, h/o recurrent UTIs with similar UAs. Otherwise, pt was sent for CT Chest and A/P given her findings. No PE, PNA, or PTX. Abdomen negative. She does appear mildly hypervolemic with edema and I suspect her SOB may be acute on chronic mild CHF. Will double her lasix x 3 days, and start her on Keflex for her UTI. Othewrise, she remains HDS and very well appearing. D/c with continue outpt care.   Duffy Bruce, MD 11/12/19 1102

## 2019-11-12 NOTE — ED Provider Notes (Addendum)
[facility] Emergency Department Provider Note  ____________________________________________   First MD Initiated Contact with Patient 11/12/19 [phone]     (approximate)  I have reviewed the triage vital signs and the nursing notes.   HISTORY  Chief Complaint Abdominal Pain    HPI Catherine [name] is a 78 y.o. female depression, irritable bowel syndrome CHF, CAD, status post TAVR who comes in for abdominal pain.  Patient was seen on 12/8 for nausea.  That time she did not have any abdominal pain so CT imaging was not done and patient was tolerating p.o. so she was discharged home.  Patient returns today for abdominal pain.  Patient states her abdominal pain has been going on intermittently since Sunday.  At its worst it is severe, nonradiating, nothing makes better, nothing makes it worse.  Is associate with nausea and vomiting that has been better with the Zofran that she was prescribed recently.  She denies any urinary symptoms.  She also does endorse some increasing shortness of breath in a similar timeframe.  The shortness of breath is moderate.  She has been taking Lasix.  Appears that she is on 20 mg daily.          Past Medical History:  Diagnosis Date  . Acute colitis   . Anemia   . CAD (coronary artery disease)    a. heavy calcification of the entire LAD & mod eccentric mid LAD stenosis, estimated @ 70%, mild nonobs stenosis of RCA and LCx, severely calcified Ao valve with restricted valve mobility and 2+ AI    . Cancer [facility]) 1981   breast  . CHB (complete heart block) ([place]) 08/31/2015   [place] DR model [id number] (serial number [id number] )   . Chronic diastolic congestive heart failure ([place])    a. echo 08/31/2015: EF 65-70%, nl WM, GR1DD, Ao valve stent bioprosthesis was present and functioning nl, no regurg, LA mildly dilated, PASP 46 mm Hg  . Collagen vascular disease ([place])   . Colon polyps   . Depression   . Diabetes mellitus type  II   . Fibromyalgia   . HTN (hypertension)   . Hypercholesteremia   . Hypothyroidism   . IBS (irritable bowel syndrome)   . Rectal bleeding   . Rheumatoid arthritis(714.0)   . S/P TAVR (transcatheter aortic valve replacement) 08/30/2015   26 mm Edwards Sapien 3 transcatheter heart valve placed via open right transfemoral approach  . Shortness of breath dyspnea   . Stenosis of aortic valve   . Thyroid disease     Patient Active Problem List   Diagnosis Date Noted  . Polypharmacy 03/12/2019  . Generalized weakness   . Shortness of breath   . Near syncope 10/24/2018  . Thyroid disease   . Aortic valve stenosis   . Rectal bleeding   . Hyperlipidemia   . Hypertension   . Depression   . Colon polyps   . Collagen vascular disease ([place])   . CAD (coronary artery disease)   . Anemia   . Acute colitis   . Abdominal pain   . BPPV (benign paroxysmal positional vertigo), bilateral   . CVA (cerebral vascular accident) ([place])   . TIA (transient ischemic attack) 03/09/2017  . Dizziness   . Prolonged Q-T interval on ECG 03/13/2016  . Suicidal ideation 03/06/2016  . Severe recurrent major depression without psychotic features ([place])   . Sleep disturbance 02/28/2016  . Chest pain 09/24/2015  . Hypokalemia 09/07/2015  .  IBS (irritable bowel syndrome)   . Cancer ([place])   . Pneumothorax 09/05/2015  . Complete heart block ([place])   . CHB (complete heart block) ([place]) 08/31/2015  . Status post transcatheter aortic valve replacement (TAVR) using bioprosthesis 08/30/2015  . Type II diabetes mellitus ([place])   . Essential hypertension   . Chronic diastolic congestive heart failure ([place])   . Rheumatoid arthritis ([place])   . Hypothyroidism   . Fibromyalgia   . Aortic valve stenosis, critical 08/22/2015  . MDD (major depressive disorder) ([place]) 05/26/2012    Past Surgical History:  Procedure Laterality Date  . ABDOMINAL SURGERY     Intestinal surgery  . CARDIAC SURGERY    . CESAREAN SECTION      x 2  . EP IMPLANTABLE DEVICE N/A 08/31/2015   Procedure: Pacemaker Implant;  Surgeon: Will Meredith Leeds, MD; [place] (serial number [id number] ); Laterality: Right  . LEFT OOPHORECTOMY    . MASTECTOMY Left   . polyp     . self inflicted chest wound    . [place]   lower back  . TEE WITHOUT CARDIOVERSION N/A 08/30/2015   Procedure: TRANSESOPHAGEAL ECHOCARDIOGRAM (TEE);  Surgeon: Sherren Mocha, MD;  Location: [place];  Service: Open Heart Surgery;  Laterality: N/A;  . TOTAL ABDOMINAL HYSTERECTOMY    . TRANSCATHETER AORTIC VALVE REPLACEMENT, TRANSFEMORAL N/A 08/30/2015   Procedure: TRANSCATHETER AORTIC VALVE REPLACEMENT, TRANSFEMORAL;  Surgeon: Sherren Mocha, MD;  Location: [place];  Service: Open Heart Surgery;  Laterality: N/A;    Prior to Admission medications   Medication Sig Start Date End Date Taking? Authorizing Provider  aspirin EC 81 MG tablet Take 81 mg by mouth daily.    [provider]  atorvastatin (LIPITOR) 20 MG tablet Take 1 tablet (20 mg total) by mouth daily. 09/28/19   Minna Merritts, MD  budesonide (PULMICORT) 0.5 MG/2ML nebulizer solution Take 2 mLs (0.5 mg total) by nebulization 2 (two) times daily. 10/29/18 11/28/18  Mayo, Pete Pelt, MD  clonazePAM (KLONOPIN) 0.5 MG tablet Take 1 tablet (0.5 mg total) by mouth at bedtime. 09/30/19   Kathlee Nations, MD  conjugated estrogens (PREMARIN) vaginal cream Apply 0.5mg  (pea-sized amount)  just inside the vaginal introitus with a finger-tip on  Monday, Wednesday and Friday nights. 10/13/19   Zara Council A, PA-C  furosemide (LASIX) 20 MG tablet Take 1 tablet (20 mg total) by mouth daily. Patient not taking: Reported on 07/21/2019 04/28/19 07/27/19  Minna Merritts, MD  HYDROcodone-acetaminophen (NORCO/VICODIN) 5-325 MG tablet Take 1 tablet by mouth every 4 (four) hours as needed for moderate pain. 09/12/19   Cuthriell, Charline Bills, PA-C  levothyroxine (SYNTHROID, LEVOTHROID) 100  MCG tablet Take 100 mcg by mouth daily before breakfast.     [provider]  losartan (COZAAR) 50 MG tablet Take 1 tablet (50 mg total) by mouth as directed. Take HALF tablet (25 mg) for one week then increase to whole tablet (50 mg) if BP remains high. 03/12/19 06/10/19  Minna Merritts, MD  mirabegron ER (MYRBETRIQ) 25 MG TB24 tablet Take 1 tablet (25 mg total) by mouth daily. 07/21/19   McGowan, Larene Beach A, PA-C  nitroGLYCERIN (NITROSTAT) 0.4 MG SL tablet Place 1 tablet (0.4 mg total) under the tongue every 5 (five) minutes as needed for chest pain. 03/02/19   Minna Merritts, MD  ondansetron (ZOFRAN ODT) 4 MG disintegrating tablet Take 1 tablet (4 mg total) by mouth every 8 (  eight) hours as needed. 11/10/19   Lavonia Drafts, MD  PARoxetine (PAXIL) 20 MG tablet Take 1 tablet (20 mg total) by mouth daily. 09/30/19 09/29/20  Arfeen, Arlyce Harman, MD  polyethylene glycol powder (GLYCOLAX/MIRALAX) 17 GM/SCOOP powder Take 17 g by mouth daily as needed for moderate constipation. 06/15/19   Harvest Dark, MD  potassium chloride (KLOR-CON) 10 MEQ tablet TAKE 1 TABLET BY MOUTH EVERY DAY TAKE AN EXTRA TABLET ON DAYS WHEN YOU TAKE AN EXTRA LASIX 09/07/19   Rise Mu, PA-C  predniSONE (DELTASONE) 50 MG tablet Take 1 tablet (50 mg total) by mouth daily with breakfast. 09/12/19   Cuthriell, Charline Bills, PA-C  traZODone (DESYREL) 50 MG tablet Take 1/2 to one tab at bed time. 09/30/19   Arfeen, Arlyce Harman, MD    Allergies Sulfa antibiotics and Latex  Family History  Problem Relation Age of Onset  . Depression Sister   . Depression Sister   . Depression Sister   . Hypertension Mother   . Lung cancer Sister   . Stroke Sister   . Hypertension Sister   . Heart attack Neg Hx     Social History Social History   Tobacco Use  . Smoking status: Never Smoker  . Smokeless tobacco: Never Used  Substance Use Topics  . Alcohol use: No    Alcohol/week: 0.0 standard drinks  . Drug use: No      Review of  Systems Constitutional: No fever/chills Eyes: No visual changes. ENT: No sore throat. Cardiovascular: Denies chest pain. Respiratory: Positive shortness of breath  gastrointestinal: Positive abdominal pain, nausea, vomiting no diarrhea.  No constipation. Genitourinary: Negative for dysuria. Musculoskeletal: Negative for back pain. Skin: Negative for rash. Neurological: Negative for headaches, focal weakness or numbness. All other ROS negative ____________________________________________   PHYSICAL EXAM:  VITAL SIGNS: ED Triage Vitals  Enc Vitals Group     BP 11/12/19 0341 (!) 171/72     Pulse Rate 11/12/19 0341 96     Resp 11/12/19 0341 18     Temp 11/12/19 0341 99.3 F (37.4 C)     Temp Source 11/12/19 0341 Oral     SpO2 11/12/19 0341 96 %     Weight 11/12/19 0332 196 lb (88.9 kg)     Height 11/12/19 0332 5\' 3"  (1.6 m)     Head Circumference --      Peak Flow --      Pain Score 11/12/19 0332 9     Pain Loc --      Pain Edu? --      Excl. in [place]? --     Constitutional: Alert and oriented. Well appearing and in no acute distress. Eyes: Conjunctivae are normal. EOMI. Head: Atraumatic. Nose: No congestion/rhinnorhea. Mouth/Throat: Mucous membranes are moist.   Neck: No stridor. Trachea Midline. FROM Cardiovascular: Normal rate, regular rhythm. Grossly normal heart sounds.  Good peripheral circulation. Respiratory: Normal respiratory effort.  No retractions. Lungs CTAB. Gastrointestinal: Soft but tender in the lower abdomen.  No distention. No abdominal bruits.  Musculoskeletal: 1+ edema bilaterally..  No joint effusions. Neurologic:  Normal speech and language. No gross focal neurologic deficits are appreciated.  Skin:  Skin is warm, dry and intact. No rash noted. Psychiatric: Mood and affect are normal. Speech and behavior are normal. GU: Deferred   ____________________________________________   LABS (all labs ordered are listed, but only abnormal results are  displayed)  Labs Reviewed  CBC WITH DIFFERENTIAL/PLATELET - Abnormal; Notable for the following components:  Result Value   RBC 3.75 (*)    Hemoglobin 9.9 (*)    HCT 32.6 (*)    RDW 16.2 (*)    Monocytes Absolute 1.1 (*)    Eosinophils Absolute 0.6 (*)    All other components within normal limits  COMPREHENSIVE METABOLIC PANEL - Abnormal; Notable for the following components:   Sodium 134 (*)    Chloride 96 (*)    Glucose, Bld 198 (*)    Creatinine, Ser 1.05 (*)    GFR calc non Af Amer 50 (*)    GFR calc Af Amer 58 (*)    All other components within normal limits  URINALYSIS, COMPLETE (UACMP) WITH MICROSCOPIC - Abnormal; Notable for the following components:   Color, Urine YELLOW (*)    APPearance HAZY (*)    Leukocytes,Ua TRACE (*)    Bacteria, UA RARE (*)    All other components within normal limits  LIPASE, BLOOD  BRAIN NATRIURETIC PEPTIDE  TROPONIN I (HIGH SENSITIVITY)  TROPONIN I (HIGH SENSITIVITY)   ____________________________________________   ED ECG REPORT I, Vanessa [place], the attending physician, personally viewed and interpreted this ECG.  EKG normal sinus rate oof 86, no st elevation, t wave inversion II, III, V1-V6. Normal interbals.  ____________________________________________  [place], personally viewed and evaluated these images (plain radiographs) as part of my medical decision making, as well as reviewing the written report by the radiologist.  ED MD interpretation:  Pending   Official radiology report(s): No results found.  ____________________________________________   PROCEDURES  Procedure(s) performed (including Critical Care):  Procedures   ____________________________________________   INITIAL IMPRESSION / ASSESSMENT AND PLAN / ED COURSE  Catherine [name] was evaluated in Emergency Department on 11/12/2019 for the symptoms described in the history of present illness. She was evaluated in the context of the  global COVID-19 pandemic, which necessitated consideration that the patient might be at risk for infection with the SARS-CoV-2 virus that causes COVID-19. Institutional protocols and algorithms that pertain to the evaluation of patients at risk for COVID-19 are in a state of rapid change based on information released by regulatory bodies including the CDC and federal and state organizations. These policies and algorithms were followed during the patient's care in the ED.    Patient is a well-appearing 78 year old who is representing for abdominal pain nausea and vomiting.  CT imaging to evaluate for SBO, pancreatitis, appendicitis, diverticulitis.  Will get basic labs to evaluate for electrolyte abnormalities, AKI.  Will get urine to evaluate for UTI but denies any symptoms.  For patient shortness of breath possibly secondary to fluid overload also consider PE.  Given him already went to be doing a CT scan of her abdomen we will just do a CT scan to ensure no evidence of lung issues.  Hemoglobin around baseline at 9.9.  White count is normal which is reassuring.  Kidney function is slightly elevated at 1.05 but she fluctuates previously around that.  Sugar elevated 198 but no evidence of DKA.  UA without evidence of UTI  Patient handed off pending the rest of her labs as well as CT scan.    ____________________________________________   FINAL CLINICAL IMPRESSION(S) / ED DIAGNOSES   Final diagnoses:  Lower abdominal pain      MEDICATIONS GIVEN DURING THIS VISIT:  Medications  fentaNYL (SUBLIMAZE) injection 50 mcg (50 mcg Intravenous Given 11/12/19 [id number])     ED Discharge Orders    None  Note:  This document was prepared using Dragon voice recognition software and may include unintentional dictation errors.   Vanessa [place], MD 11/12/19 [id number]    Vanessa Blevins, MD 11/22/19 [phone]

## 2019-11-14 LAB — URINE CULTURE
Culture: NO GROWTH
Special Requests: NORMAL

## 2019-11-17 ENCOUNTER — Other Ambulatory Visit: Payer: Self-pay | Admitting: Cardiovascular Disease

## 2019-11-18 ENCOUNTER — Other Ambulatory Visit: Payer: Self-pay

## 2019-11-18 ENCOUNTER — Emergency Department
Admission: EM | Admit: 2019-11-18 | Discharge: 2019-11-19 | Disposition: A | Discharge status: Home / Self Care | Payer: Medicare Other | Attending: Emergency Medicine | Admitting: Emergency Medicine

## 2019-11-18 DIAGNOSIS — E119 Type 2 diabetes mellitus without complications: Secondary | ICD-10-CM | POA: Insufficient documentation

## 2019-11-18 DIAGNOSIS — I10 Essential (primary) hypertension: Secondary | ICD-10-CM | POA: Insufficient documentation

## 2019-11-18 DIAGNOSIS — I2510 Atherosclerotic heart disease of native coronary artery without angina pectoris: Secondary | ICD-10-CM | POA: Diagnosis not present

## 2019-11-18 DIAGNOSIS — Z79899 Other long term (current) drug therapy: Secondary | ICD-10-CM | POA: Diagnosis not present

## 2019-11-18 DIAGNOSIS — Z91040 Latex allergy status: Secondary | ICD-10-CM | POA: Insufficient documentation

## 2019-11-18 DIAGNOSIS — E039 Hypothyroidism, unspecified: Secondary | ICD-10-CM | POA: Insufficient documentation

## 2019-11-18 DIAGNOSIS — K625 Hemorrhage of anus and rectum: Secondary | ICD-10-CM | POA: Insufficient documentation

## 2019-11-18 DIAGNOSIS — Z7982 Long term (current) use of aspirin: Secondary | ICD-10-CM | POA: Insufficient documentation

## 2019-11-18 DIAGNOSIS — R1084 Generalized abdominal pain: Secondary | ICD-10-CM | POA: Diagnosis not present

## 2019-11-18 LAB — CBC
HCT: 34.2 % — ABNORMAL LOW (ref 36.0–46.0)
Hemoglobin: 10.5 g/dL — ABNORMAL LOW (ref 12.0–15.0)
MCH: 26.4 pg (ref 26.0–34.0)
MCHC: 30.7 g/dL (ref 30.0–36.0)
MCV: 85.9 fL (ref 80.0–100.0)
Platelets: 390 10*3/uL (ref 150–400)
RBC: 3.98 MIL/uL (ref 3.87–5.11)
RDW: 15.9 % — ABNORMAL HIGH (ref 11.5–15.5)
WBC: 8.3 10*3/uL (ref 4.0–10.5)
nRBC: 0 % (ref 0.0–0.2)

## 2019-11-18 LAB — COMPREHENSIVE METABOLIC PANEL
ALT: 17 U/L (ref 0–44)
AST: 26 U/L (ref 15–41)
Albumin: 3.3 g/dL — ABNORMAL LOW (ref 3.5–5.0)
Alkaline Phosphatase: 77 U/L (ref 38–126)
Anion gap: 14 (ref 5–15)
BUN: 23 mg/dL (ref 8–23)
CO2: 27 mmol/L (ref 22–32)
Calcium: 8.6 mg/dL — ABNORMAL LOW (ref 8.9–10.3)
Chloride: 91 mmol/L — ABNORMAL LOW (ref 98–111)
Creatinine, Ser: 1.14 mg/dL — ABNORMAL HIGH (ref 0.44–1.00)
GFR calc Af Amer: 53 mL/min — ABNORMAL LOW (ref >60)
GFR calc non Af Amer: 45 mL/min — ABNORMAL LOW (ref >60)
Glucose, Bld: 204 mg/dL — ABNORMAL HIGH (ref 70–99)
Potassium: 3.1 mmol/L — ABNORMAL LOW (ref 3.5–5.1)
Sodium: 132 mmol/L — ABNORMAL LOW (ref 135–145)
Total Bilirubin: 0.6 mg/dL (ref 0.3–1.2)
Total Protein: 7.1 g/dL (ref 6.5–8.1)

## 2019-11-18 LAB — PROTIME-INR
INR: 1 (ref 0.8–1.2)
Prothrombin Time: 12.9 seconds (ref 11.4–15.2)

## 2019-11-18 MED ORDER — MORPHINE SULFATE (PF) 2 MG/ML IV SOLN
2.0000 mg | Freq: Once | INTRAVENOUS | Status: AC
  Administered 2019-11-19: 2 mg via INTRAVENOUS
  Filled 2019-11-18: qty 1

## 2019-11-18 NOTE — ED Triage Notes (Signed)
Rectal bleeding bright red blood x 3 hours.

## 2019-11-18 NOTE — ED Provider Notes (Signed)
[facility] Emergency Department Provider Note  ____________________________________________   First MD Initiated Contact with Patient 11/18/19 2315     (approximate)  I have reviewed the triage vital signs and the nursing notes.   HISTORY  Chief Complaint Rectal Bleeding   HPI Catherine [name] is a 78 y.o. female with below list of previous medical conditions including previous GI bleed presents to the emergency department secondary to bright red blood noted in her stool 3 hours ago which patient states was "a lot".  Patient also admits to dizziness.  Patient denies any chest pain or shortness of breath.  Patient denies any nausea or vomiting.  Patient does however admit to generalized abdominal discomfort that is currently 8 out of 10.       Past Medical History:  Diagnosis Date  . Acute colitis   . Anemia   . CAD (coronary artery disease)    a. heavy calcification of the entire LAD & mod eccentric mid LAD stenosis, estimated @ 70%, mild nonobs stenosis of RCA and LCx, severely calcified Ao valve with restricted valve mobility and 2+ AI    . Cancer [facility]) 1981   breast  . CHB (complete heart block) ([place]) 08/31/2015   [place] DR model [id number] (serial number [id number] )   . Chronic diastolic congestive heart failure ([place])    a. echo 08/31/2015: EF 65-70%, nl WM, GR1DD, Ao valve stent bioprosthesis was present and functioning nl, no regurg, LA mildly dilated, PASP 46 mm Hg  . Collagen vascular disease ([place])   . Colon polyps   . Depression   . Diabetes mellitus type II   . Fibromyalgia   . HTN (hypertension)   . Hypercholesteremia   . Hypothyroidism   . IBS (irritable bowel syndrome)   . Rectal bleeding   . Rheumatoid arthritis(714.0)   . S/P TAVR (transcatheter aortic valve replacement) 08/30/2015   26 mm Edwards Sapien 3 transcatheter heart valve placed via open right transfemoral approach  . Shortness of breath dyspnea   . Stenosis  of aortic valve   . Thyroid disease     Patient Active Problem List   Diagnosis Date Noted  . Polypharmacy 03/12/2019  . Generalized weakness   . Shortness of breath   . Near syncope 10/24/2018  . Thyroid disease   . Aortic valve stenosis   . Rectal bleeding   . Hyperlipidemia   . Hypertension   . Depression   . Colon polyps   . Collagen vascular disease ([place])   . CAD (coronary artery disease)   . Anemia   . Acute colitis   . Abdominal pain   . BPPV (benign paroxysmal positional vertigo), bilateral   . CVA (cerebral vascular accident) ([place])   . TIA (transient ischemic attack) 03/09/2017  . Dizziness   . Prolonged Q-T interval on ECG 03/13/2016  . Suicidal ideation 03/06/2016  . Severe recurrent major depression without psychotic features ([place])   . Sleep disturbance 02/28/2016  . Chest pain 09/24/2015  . Hypokalemia 09/07/2015  . IBS (irritable bowel syndrome)   . Cancer ([place])   . Pneumothorax 09/05/2015  . Complete heart block ([place])   . CHB (complete heart block) ([place]) 08/31/2015  . Status post transcatheter aortic valve replacement (TAVR) using bioprosthesis 08/30/2015  . Type II diabetes mellitus ([place])   . Essential hypertension   . Chronic diastolic congestive heart failure ([place])   . Rheumatoid arthritis ([place])   . Hypothyroidism   .  Fibromyalgia   . Aortic valve stenosis, critical 08/22/2015  . MDD (major depressive disorder) ([place]) 05/26/2012    Past Surgical History:  Procedure Laterality Date  . ABDOMINAL SURGERY     Intestinal surgery  . CARDIAC SURGERY    . CESAREAN SECTION     x 2  . EP IMPLANTABLE DEVICE N/A 08/31/2015   Procedure: Pacemaker Implant;  Surgeon: Will Meredith Leeds, MD; [place] (serial number [id number] ); Laterality: Right  . LEFT OOPHORECTOMY    . MASTECTOMY Left   . polyp     . self inflicted chest wound    . [place]   lower back  . TEE WITHOUT CARDIOVERSION N/A 08/30/2015   Procedure:  TRANSESOPHAGEAL ECHOCARDIOGRAM (TEE);  Surgeon: Sherren Mocha, MD;  Location: [place];  Service: Open Heart Surgery;  Laterality: N/A;  . TOTAL ABDOMINAL HYSTERECTOMY    . TRANSCATHETER AORTIC VALVE REPLACEMENT, TRANSFEMORAL N/A 08/30/2015   Procedure: TRANSCATHETER AORTIC VALVE REPLACEMENT, TRANSFEMORAL;  Surgeon: Sherren Mocha, MD;  Location: [place];  Service: Open Heart Surgery;  Laterality: N/A;    Prior to Admission medications   Medication Sig Start Date End Date Taking? Authorizing Provider  aspirin EC 81 MG tablet Take 81 mg by mouth daily.    [provider]  atorvastatin (LIPITOR) 20 MG tablet Take 1 tablet (20 mg total) by mouth daily. 09/28/19   Minna Merritts, MD  budesonide (PULMICORT) 0.5 MG/2ML nebulizer solution Take 2 mLs (0.5 mg total) by nebulization 2 (two) times daily. 10/29/18 11/28/18  Mayo, Pete Pelt, MD  cephALEXin (KEFLEX) 500 MG capsule Take 1 capsule (500 mg total) by mouth 3 (three) times daily for 7 days. 11/12/19 11/19/19  Duffy Bruce, MD  clonazePAM (KLONOPIN) 0.5 MG tablet Take 1 tablet (0.5 mg total) by mouth at bedtime. 09/30/19   Kathlee Nations, MD  conjugated estrogens (PREMARIN) vaginal cream Apply 0.5mg  (pea-sized amount)  just inside the vaginal introitus with a finger-tip on  Monday, Wednesday and Friday nights. 10/13/19   Zara Council A, PA-C  furosemide (LASIX) 20 MG tablet Take 1 tablet (20 mg total) by mouth daily. Patient not taking: Reported on 07/21/2019 04/28/19 07/27/19  Minna Merritts, MD  HYDROcodone-acetaminophen (NORCO/VICODIN) 5-325 MG tablet Take 1 tablet by mouth every 4 (four) hours as needed for moderate pain. 09/12/19   Cuthriell, Charline Bills, PA-C  levothyroxine (SYNTHROID, LEVOTHROID) 100 MCG tablet Take 100 mcg by mouth daily before breakfast.     [provider]  losartan (COZAAR) 50 MG tablet Take 1 tablet (50 mg total) by mouth as directed. Take HALF tablet (25 mg) for one week then increase to whole tablet  (50 mg) if BP remains high. 03/12/19 06/10/19  Minna Merritts, MD  mirabegron ER (MYRBETRIQ) 25 MG TB24 tablet Take 1 tablet (25 mg total) by mouth daily. 07/21/19   McGowan, Larene Beach A, PA-C  nitroGLYCERIN (NITROSTAT) 0.4 MG SL tablet PLACE 1 TABLET (0.4 MG TOTAL) UNDER THE TONGUE EVERY 5 (FIVE) MINUTES AS NEEDED FOR CHEST PAIN. 11/17/19   Minna Merritts, MD  ondansetron (ZOFRAN ODT) 4 MG disintegrating tablet Take 1 tablet (4 mg total) by mouth every 8 (eight) hours as needed. 11/10/19   Lavonia Drafts, MD  PARoxetine (PAXIL) 20 MG tablet Take 1 tablet (20 mg total) by mouth daily. 09/30/19 09/29/20  Arfeen, Arlyce Harman, MD  polyethylene glycol powder (GLYCOLAX/MIRALAX) 17 GM/SCOOP powder Take 17 g by mouth daily as needed  for moderate constipation. 06/15/19   Harvest Dark, MD  potassium chloride (KLOR-CON) 10 MEQ tablet TAKE 1 TABLET BY MOUTH EVERY DAY TAKE AN EXTRA TABLET ON DAYS WHEN YOU TAKE AN EXTRA LASIX 09/07/19   Rise Mu, PA-C  predniSONE (DELTASONE) 50 MG tablet Take 1 tablet (50 mg total) by mouth daily with breakfast. 09/12/19   Cuthriell, Charline Bills, PA-C  traZODone (DESYREL) 50 MG tablet Take 1/2 to one tab at bed time. 09/30/19   Arfeen, Arlyce Harman, MD    Allergies Sulfa antibiotics and Latex  Family History  Problem Relation Age of Onset  . Depression Sister   . Depression Sister   . Depression Sister   . Hypertension Mother   . Lung cancer Sister   . Stroke Sister   . Hypertension Sister   . Heart attack Neg Hx     Social History Social History   Tobacco Use  . Smoking status: Never Smoker  . Smokeless tobacco: Never Used  Substance Use Topics  . Alcohol use: No    Alcohol/week: 0.0 standard drinks  . Drug use: No    Review of Systems Constitutional: No fever/chills Eyes: No visual changes. ENT: No sore throat. Cardiovascular: Denies chest pain. Respiratory: Denies shortness of breath. Gastrointestinal: No abdominal pain.  No nausea, no vomiting.  No  diarrhea.  No constipation.  Positive for bright red blood per rectum Genitourinary: Negative for dysuria. Musculoskeletal: Negative for neck pain.  Negative for back pain. Integumentary: Negative for rash. Neurological: Negative for headaches, focal weakness or numbness.   ____________________________________________   PHYSICAL EXAM:  VITAL SIGNS: ED Triage Vitals [11/18/19 2254]  Enc Vitals Group     BP      Pulse      Resp      Temp      Temp src      SpO2      Weight 89.5 kg (197 lb 5 oz)     Height 1.6 m (5\' 3" )     Head Circumference      Peak Flow      Pain Score 8     Pain Loc      Pain Edu?      Excl. in [place]?    Constitutional: Alert and oriented.  Eyes: Conjunctivae are normal.  Mouth/Throat: Patient is wearing a mask. Neck: No stridor.  No meningeal signs.   Cardiovascular: Normal rate, regular rhythm. Good peripheral circulation. Grossly normal heart sounds. Respiratory: Normal respiratory effort.  No retractions. Gastrointestinal: Left lower quadrant tenderness.. No distention.  Guaiac positive Musculoskeletal: No lower extremity tenderness nor edema. No gross deformities of extremities. Neurologic:  Normal speech and language. No gross focal neurologic deficits are appreciated.  Skin:  Skin is warm, dry and intact. Psychiatric: Mood and affect are normal. Speech and behavior are normal.  ____________________________________________   LABS (all labs ordered are listed, but only abnormal results are displayed)  Labs Reviewed  COMPREHENSIVE METABOLIC PANEL - Abnormal; Notable for the following components:      Result Value   Sodium 132 (*)    Potassium 3.1 (*)    Chloride 91 (*)    Glucose, Bld 204 (*)    Creatinine, Ser 1.14 (*)    Calcium 8.6 (*)    Albumin 3.3 (*)    GFR calc non Af Amer 45 (*)    GFR calc Af Amer 53 (*)    All other components within normal limits  CBC - Abnormal; Notable  for the following components:   Hemoglobin 10.5 (*)     HCT 34.2 (*)    RDW 15.9 (*)    All other components within normal limits  HEMOGLOBIN AND HEMATOCRIT, BLOOD - Abnormal; Notable for the following components:   Hemoglobin 10.9 (*)    HCT 34.3 (*)    All other components within normal limits  URINALYSIS, COMPLETE (UACMP) WITH MICROSCOPIC - Abnormal; Notable for the following components:   Color, Urine STRAW (*)    APPearance CLEAR (*)    All other components within normal limits  PROTIME-INR  POC OCCULT BLOOD, ED  TYPE AND SCREEN   _________  RADIOLOGY I, [place] Eura Mccauslin, personally viewed and evaluated these images (plain radiographs) as part of my medical decision making, as well as reviewing the written report by the radiologist.  ED MD interpretation: No diverticulitis or explanation for the patient's rectal bleeding  Official radiology report(s): CT ABDOMEN PELVIS W CONTRAST  Result Date: 11/19/2019 CLINICAL DATA:  Rectal bleeding. Diverticulitis suspected. EXAM: CT ABDOMEN AND PELVIS WITH CONTRAST TECHNIQUE: Multidetector CT imaging of the abdomen and pelvis was performed using the standard protocol following bolus administration of intravenous contrast. CONTRAST:  91mL OMNIPAQUE IOHEXOL 300 MG/ML  SOLN COMPARISON:  CT 1 week ago 11/12/2019, multiple additional priors. FINDINGS: Lower chest: Prosthetic aortic valve. Mild cardiomegaly unchanged. Pacemaker partially included. Mild dependent atelectasis in the lung bases. No pleural fluid. Hepatobiliary: 17 mm low-density lesion in the central liver is incompletely characterized. This may represent focal fatty infiltration, however is new from 09/24/2015 abdominal CT. Gallbladder physiologically distended, no calcified stone. No biliary dilatation. Pancreas: No ductal dilatation or inflammation. Spleen: Normal in size without focal abnormality. Adrenals/Urinary Tract: Normal adrenal glands. Dilatation of bilateral renal collecting systems and ureters to the bladder insertion.  Symmetric excretion on delayed phase imaging. No significant perinephric edema. Small low-density lesions in the right kidney are too small to accurately characterize. Exophytic 14 mm lesion from the lower right kidney unchanged from 09/24/2015 and likely hyperdense cyst. Punctate nonobstructing stone in the lower right kidney. Urinary bladder is distended. Stomach/Bowel: Stomach is unremarkable. No small bowel obstruction or inflammation. Administered enteric contrast reaches the cecum. Appendix not confidently visualized. No pericecal inflammation. Moderate volume of stool throughout the entire colon. There is colonic tortuosity. No significant diverticular disease. No colonic inflammation or wall thickening. Vascular/Lymphatic: Diffuse aortic and branch atherosclerosis. Slight flaring at the portal splenic venous confluence is unchanged. Portal vein is patent. No adenopathy. Reproductive: Post hysterectomy. Right ovary is normal. Left ovary not visualized. Other: No ascites or free air. A fat containing umbilical hernia. Musculoskeletal: Multilevel degenerative change throughout spine. There are no acute or suspicious osseous abnormalities. IMPRESSION: 1. No diverticulitis or explanation for rectal bleeding. Moderate colonic stool burden with colonic tortuosity, can be seen with constipation. 2. Distended urinary bladder with mild bilateral hydronephrosis and hydroureter to the level of the bladder insertion. This may be secondary to bladder outlet obstruction, however recommend correlation with urinalysis to exclude urinary tract infection. Aortic Atherosclerosis (ICD10-I70.0). Electronically Signed   By: Keith Rake M.D.   On: 11/19/2019 04:00    Procedures   ____________________________________________   INITIAL IMPRESSION / MDM / ASSESSMENT AND PLAN / ED COURSE  As part of my medical decision making, I reviewed the following data within the [place] NUMBER   78 year old female  presented with above-stated history and physical exam secondary to bright red blood per rectum.  Patient guaiac positive laboratory data  revealed a hemoglobin of 10.5 which was repeated 4 hours later which was 10.9.  CT scan of the abdomen pelvis performed which revealed no explanation for the patient's bright red blood per rectum.  Patient is hemodynamically stable not tachycardic or hypotensive while in the emergency department.  No clear explanation for the patient's bright red blood per rectum identified.  Patient is hemodynamically stable hemoglobin stable with no further episodes of bleeding while in the emergency department.  Patient be referred to Dr.  Bonna Gains gastroenterologist and primary care provider for further outpatient evaluation.     ____________________________________________  FINAL CLINICAL IMPRESSION(S) / ED DIAGNOSES  Final diagnoses:  Rectal bleeding     MEDICATIONS GIVEN DURING THIS VISIT:  Medications  iohexol (OMNIPAQUE) 9 MG/ML oral solution 500 mL (500 mLs Oral Contrast Given 11/19/19 0234)  morphine 2 MG/ML injection 2 mg (2 mg Intravenous Given 11/19/19 0025)  morphine 2 MG/ML injection 2 mg (2 mg Intravenous Given 11/19/19 0308)  iohexol (OMNIPAQUE) 300 MG/ML solution 75 mL (75 mLs Intravenous Contrast Given 11/19/19 0321)     ED Discharge Orders    None      *Please note:  Catherine [name] was evaluated in Emergency Department on 11/19/2019 for the symptoms described in the history of present illness. She was evaluated in the context of the global COVID-19 pandemic, which necessitated consideration that the patient might be at risk for infection with the SARS-CoV-2 virus that causes COVID-19. Institutional protocols and algorithms that pertain to the evaluation of patients at risk for COVID-19 are in a state of rapid change based on information released by regulatory bodies including the CDC and federal and state organizations. These policies and algorithms  were followed during the patient's care in the ED.  Some ED evaluations and interventions may be delayed as a result of limited staffing during the pandemic.*  Note:  This document was prepared using Dragon voice recognition software and may include unintentional dictation errors.   Gregor Hams, MD 11/19/19 [phone]

## 2019-11-19 ENCOUNTER — Emergency Department: Payer: Medicare Other

## 2019-11-19 LAB — URINALYSIS, COMPLETE (UACMP) WITH MICROSCOPIC
Bacteria, UA: NONE SEEN
Bilirubin Urine: NEGATIVE
Glucose, UA: NEGATIVE mg/dL
Hgb urine dipstick: NEGATIVE
Ketones, ur: NEGATIVE mg/dL
Leukocytes,Ua: NEGATIVE
Nitrite: NEGATIVE
Protein, ur: NEGATIVE mg/dL
Specific Gravity, Urine: 1.013 (ref 1.005–1.030)
WBC, UA: NONE SEEN WBC/hpf (ref 0–5)
pH: 6 (ref 5.0–8.0)

## 2019-11-19 LAB — HEMOGLOBIN AND HEMATOCRIT, BLOOD
HCT: 34.3 % — ABNORMAL LOW (ref 36.0–46.0)
Hemoglobin: 10.9 g/dL — ABNORMAL LOW (ref 12.0–15.0)

## 2019-11-19 LAB — TYPE AND SCREEN
ABO/RH(D): A POS
Antibody Screen: NEGATIVE

## 2019-11-19 MED ORDER — MORPHINE SULFATE (PF) 2 MG/ML IV SOLN
INTRAVENOUS | Status: AC
  Filled 2019-11-19: qty 1

## 2019-11-19 MED ORDER — IOHEXOL 9 MG/ML PO SOLN
500.0000 mL | Freq: Once | ORAL | Status: DC | PRN
  Administered 2019-11-19: 500 mL via ORAL

## 2019-11-19 MED ORDER — MORPHINE SULFATE (PF) 2 MG/ML IV SOLN
2.0000 mg | Freq: Once | INTRAVENOUS | Status: AC
  Administered 2019-11-19: 2 mg via INTRAVENOUS

## 2019-11-19 MED ORDER — IOHEXOL 300 MG/ML  SOLN
75.0000 mL | Freq: Once | INTRAMUSCULAR | Status: AC | PRN
  Administered 2019-11-19: 75 mL via INTRAVENOUS

## 2019-11-19 NOTE — ED Notes (Signed)
Patient is resting comfortably. 

## 2019-11-19 NOTE — ED Notes (Signed)
Pt assisted to the bedside commode to urinate. Pt given her purse so she can contact her family for a ride. Pt is to be discharge.

## 2019-11-19 NOTE — ED Notes (Signed)
Pt to CT

## 2019-11-19 NOTE — ED Notes (Signed)
Bladder scanned pt that showed 358ml present. Pt assisted to the bedside commode to collect urine. Pt has urine running down her legs when she stands. Diaper is saturated. Output present in hat is 339ml. Urine sent to lab. Pt cleaned and new diaper applied.

## 2019-11-19 NOTE — ED Notes (Signed)
Pt given ice chips

## 2019-12-07 NOTE — Progress Notes (Deleted)
12/08/2019 9:30 PM   MCKINNEY GLASS 21-Jul-1941 [id number]  Referring provider: Danelle Berry, NP [address] [place],  [place] [address]  No chief complaint on file.   HPI: Catherine [name] is an 79 year old female with a history of hematuria and OAB who presents today for follow up.    History of hematuria (high risk) Non-smoker.  CTU 08/26/2018 revealed nonobstructing right renal calculus measures 3 mm.  Bosniak category 2 benign kidney lesion arises from the inferior pole of the right kidney.  Aortic Atherosclerosis.  Cystoscopy with Dr.  Bernardo Heater on 08/27/2018 was NED.  She has continued to have gross hematuria off and on.  Seen in the ED on 06/15/2019 and UA 0-5 RBC's, HCT 35.8% and Hgb 10.9. Contrast CT 06/15/2019 revealed small hyperdense lesion off the lower pole of the right kidney, likely hemorrhagic cyst, stable since prior study. Punctate nephrolithiasis in the mid and lower poles of the right kidney. No ureteral stones or hydronephrosis. Adrenal glands and urinary bladder unremarkable.  She denies any gross hematuria.  Her UA on 07/21/2019 was negative for AMH and today's CATH UA is negative for micro heme.   OAB Today, she is experiencing frequency, urgency, dysuria, incontinence, intermittency, hesitancy, and a weak urinary stream.  The patient is  experiencing urgency x 8 or more, frequency x 8 or more, is restricting fluids to avoid visits to the restroom, is engaging in toilet mapping, incontinence x 4-7 and nocturia x 4-7.   Her BP is 149/79.   Her PVR is 15 mL.  She states the Myrbetriq 25 mg is helping with her urinary symptoms.  She feels they may be a little bit worse because of possible urinary tract infection.  Right renal stones Contrast CT in 06/2019 revealed punctate right nephrolithiasis. No ureteral stones or Hydronephrosis.  No recent flank pain or passage of fragments.  KUB 07/21/2019 Nonobstructive bowel gas pattern.  No calcifications concerning for  nephrolithiasis or ureteral stones   She mentions today that she has been having burning and itching when she urinates.  She states this has been happening for the last 5 weeks.  She is applying vaginal estrogen cream 3 nights weekly.  Her CATH UA is negative.    PMH: Past Medical History:  Diagnosis Date  . Acute colitis   . Anemia   . CAD (coronary artery disease)    a. heavy calcification of the entire LAD & mod eccentric mid LAD stenosis, estimated @ 70%, mild nonobs stenosis of RCA and LCx, severely calcified Ao valve with restricted valve mobility and 2+ AI    . Cancer [facility]) 1981   breast  . CHB (complete heart block) ([place]) 08/31/2015   [place] DR model [id number] (serial number [id number] )   . Chronic diastolic congestive heart failure ([place])    a. echo 08/31/2015: EF 65-70%, nl WM, GR1DD, Ao valve stent bioprosthesis was present and functioning nl, no regurg, LA mildly dilated, PASP 46 mm Hg  . Collagen vascular disease ([place])   . Colon polyps   . Depression   . Diabetes mellitus type II   . Fibromyalgia   . HTN (hypertension)   . Hypercholesteremia   . Hypothyroidism   . IBS (irritable bowel syndrome)   . Rectal bleeding   . Rheumatoid arthritis(714.0)   . S/P TAVR (transcatheter aortic valve replacement) 08/30/2015   26 mm Edwards Sapien 3 transcatheter heart valve placed via open right transfemoral approach  .  Shortness of breath dyspnea   . Stenosis of aortic valve   . Thyroid disease     Surgical History: Past Surgical History:  Procedure Laterality Date  . ABDOMINAL SURGERY     Intestinal surgery  . CARDIAC SURGERY    . CESAREAN SECTION     x 2  . EP IMPLANTABLE DEVICE N/A 08/31/2015   Procedure: Pacemaker Implant;  Surgeon: Will Meredith Leeds, MD; [place] (serial number [id number] ); Laterality: Right  . LEFT OOPHORECTOMY    . MASTECTOMY Left   . polyp     . self inflicted chest wound    . [place]    lower back  . TEE WITHOUT CARDIOVERSION N/A 08/30/2015   Procedure: TRANSESOPHAGEAL ECHOCARDIOGRAM (TEE);  Surgeon: Sherren Mocha, MD;  Location: [facility];  Service: Open Heart Surgery;  Laterality: N/A;  . TOTAL ABDOMINAL HYSTERECTOMY    . TRANSCATHETER AORTIC VALVE REPLACEMENT, TRANSFEMORAL N/A 08/30/2015   Procedure: TRANSCATHETER AORTIC VALVE REPLACEMENT, TRANSFEMORAL;  Surgeon: Sherren Mocha, MD;  Location: [facility];  Service: Open Heart Surgery;  Laterality: N/A;    Home Medications:  Allergies as of 12/08/2019      Reactions   Sulfa Antibiotics Hives   Latex Rash      Medication List       Accurate as of December 07, 2019  9:30 PM. If you have any questions, ask your nurse or doctor.        aspirin EC 81 MG tablet Take 81 mg by mouth daily.   atorvastatin 20 MG tablet Commonly known as: LIPITOR Take 1 tablet (20 mg total) by mouth daily.   budesonide 0.5 MG/2ML nebulizer solution Commonly known as: PULMICORT Take 2 mLs (0.5 mg total) by nebulization 2 (two) times daily.   clonazePAM 0.5 MG tablet Commonly known as: KLONOPIN Take 1 tablet (0.5 mg total) by mouth at bedtime.   furosemide 20 MG tablet Commonly known as: LASIX Take 1 tablet (20 mg total) by mouth daily.   HYDROcodone-acetaminophen 5-325 MG tablet Commonly known as: NORCO/VICODIN Take 1 tablet by mouth every 4 (four) hours as needed for moderate pain.   levothyroxine 100 MCG tablet Commonly known as: SYNTHROID Take 100 mcg by mouth daily before breakfast.   losartan 50 MG tablet Commonly known as: COZAAR Take 1 tablet (50 mg total) by mouth as directed. Take HALF tablet (25 mg) for one week then increase to whole tablet (50 mg) if BP remains high.   mirabegron ER 25 MG Tb24 tablet Commonly known as: MYRBETRIQ Take 1 tablet (25 mg total) by mouth daily.   nitroGLYCERIN 0.4 MG SL tablet Commonly known as: NITROSTAT PLACE 1 TABLET (0.4 MG TOTAL) UNDER THE TONGUE EVERY 5 (FIVE) MINUTES AS NEEDED FOR  CHEST PAIN.   ondansetron 4 MG disintegrating tablet Commonly known as: Zofran ODT Take 1 tablet (4 mg total) by mouth every 8 (eight) hours as needed.   PARoxetine 20 MG tablet Commonly known as: PAXIL Take 1 tablet (20 mg total) by mouth daily.   polyethylene glycol powder 17 GM/SCOOP powder Commonly known as: GLYCOLAX/MIRALAX Take 17 g by mouth daily as needed for moderate constipation.   potassium chloride 10 MEQ tablet Commonly known as: KLOR-CON TAKE 1 TABLET BY MOUTH EVERY DAY TAKE AN EXTRA TABLET ON DAYS WHEN YOU TAKE AN EXTRA LASIX   predniSONE 50 MG tablet Commonly known as: DELTASONE Take 1 tablet (50 mg total) by mouth daily with breakfast.  Premarin vaginal cream Generic drug: conjugated estrogens Apply 0.5mg  (pea-sized amount)  just inside the vaginal introitus with a finger-tip on  Monday, Wednesday and Friday nights.   traZODone 50 MG tablet Commonly known as: DESYREL Take 1/2 to one tab at bed time.       Allergies:  Allergies  Allergen Reactions  . Sulfa Antibiotics Hives  . Latex Rash    Family History: Family History  Problem Relation Age of Onset  . Depression Sister   . Depression Sister   . Depression Sister   . Hypertension Mother   . Lung cancer Sister   . Stroke Sister   . Hypertension Sister   . Heart attack Neg Hx     Social History:  reports that she has never smoked. She has never used smokeless tobacco. She reports that she does not drink alcohol or use drugs.  ROS:                                        Physical Exam: There were no vitals taken for this visit.  Constitutional:  Well nourished. Alert and oriented, No acute distress. HEENT: [place] AT, moist mucus membranes.  Trachea midline, no masses. Cardiovascular: No clubbing, cyanosis, or edema. Respiratory: Normal respiratory effort, no increased work of breathing. GI: Abdomen is soft, non tender, non distended, no abdominal masses. Liver and  spleen not palpable.  No hernias appreciated.  Stool sample for occult testing is not indicated.   GU: No CVA tenderness.  No bladder fullness or masses.  *** external genitalia, *** pubic hair distribution, no lesions.  Normal urethral meatus, no lesions, no prolapse, no discharge.   No urethral masses, tenderness and/or tenderness. No bladder fullness, tenderness or masses. *** vagina mucosa, *** estrogen effect, no discharge, no lesions, *** pelvic support, *** cystocele and *** rectocele noted.  No cervical motion tenderness.  Uterus is freely mobile and non-fixed.  No adnexal/parametria masses or tenderness noted.  Anus and perineum are without rashes or lesions.   ***  Skin: No rashes, bruises or suspicious lesions. Lymph: No cervical or inguinal adenopathy. Neurologic: Grossly intact, no focal deficits, moving all 4 extremities. Psychiatric: Normal mood and affect.   Laboratory Data: Lab Results  Component Value Date   WBC 8.3 11/18/2019   HGB 10.9 (L) 11/19/2019   HCT 34.3 (L) 11/19/2019   MCV 85.9 11/18/2019   PLT 390 11/18/2019    Lab Results  Component Value Date   CREATININE 1.14 (H) 11/18/2019    No results found for: PSA  No results found for: TESTOSTERONE  Lab Results  Component Value Date   HGBA1C 6.6 (H) 03/10/2017    Lab Results  Component Value Date   TSH 7.993 (H) 03/10/2017       Component Value Date/Time   CHOL 186 03/10/2017 0424   HDL 43 03/10/2017 0424   CHOLHDL 4.3 03/10/2017 0424   VLDL 29 03/10/2017 0424   LDLCALC 114 (H) 03/10/2017 0424    Lab Results  Component Value Date   AST 26 11/18/2019   Lab Results  Component Value Date   ALT 17 11/18/2019   No components found for: ALKALINEPHOPHATASE No components found for: BILIRUBINTOTAL  No results found for: ESTRADIOL  Urinalysis Component     Latest Ref Rng & Units 10/13/2019  Specific Gravity, UA     1.005 - 1.030 1.020  pH,  UA     5.0 - 7.5 7.0  Color, UA     Yellow Yellow   Appearance Ur     Clear Clear  Leukocytes,UA     Negative Negative  Protein,UA     Negative/Trace Trace (A)  Glucose, UA     Negative Negative  Ketones, UA     Negative Negative  RBC, UA     Negative Negative  Bilirubin, UA     Negative Negative  Urobilinogen, Ur     0.2 - 1.0 mg/dL 0.2  Nitrite, UA     Negative Negative  Microscopic Examination      See below:   Component     Latest Ref Rng & Units 10/13/2019          WBC, UA     0 - 5 /hpf 0-5  RBC     0 - 2 /hpf None seen  Epithelial Cells (non renal)     0 - 10 /hpf None seen  Bacteria, UA     None seen/Few None seen     I have reviewed the labs.   Pertinent Imaging: Results for BRYNNLY, BROCHU (MRN [id number]) as of 10/13/2019 14:47  Ref. Range 10/13/2019 14:32  Scan Result Unknown 15   Assessment & Plan:    1. History of hematuria Hematuria work up completed in 08/2018  - findings positive for punctate right renal stones and cysts  No report of gross hematuria  UA today negative for AMH  RTC in one year for UA - patient to report any gross hematuria in the interim    2. OAB Patient states that the Myrbetriq 25 mg to provide a benefit She will continue the medication  3. Right renal calculus No intervention warranted at this time  4.  Vaginal atrophy Upon further questioning it is discovered that patient is using over-the-counter vaginal lubricants and not vaginal estrogen cream I have sent a prescription for Premarin vaginal cream and given her sample of the Premarin vaginal cream and encouraged her to use the Premarin vaginal cream as it has estrogen in it and it is crucial to restoring her vaginal lining to a healthier state.  I also explained that her burning and itching when she urinates is likely due to the vaginal atrophy.  No follow-ups on file.  These notes generated with voice recognition software. I apologize for typographical errors.  Zara Council, PA-C  [facility] [address]  [place], [place] [address] [phone]

## 2019-12-08 ENCOUNTER — Ambulatory Visit: Payer: Medicare Other | Admitting: Urology

## 2019-12-08 ENCOUNTER — Encounter: Payer: Self-pay | Admitting: Urology

## 2019-12-09 ENCOUNTER — Telehealth: Payer: Self-pay | Admitting: Cardiovascular Disease

## 2019-12-09 ENCOUNTER — Other Ambulatory Visit: Payer: Self-pay | Admitting: Physician Assistant

## 2019-12-09 NOTE — Telephone Encounter (Signed)
Patient is scheduled for 1/11 with Laurann Montana

## 2019-12-09 NOTE — Telephone Encounter (Signed)
-----   Message from Horton Finer sent at 12/09/2019  9:09 AM EST ----- Please schedule overdue F/U appointment for patient. Thank you!

## 2019-12-10 ENCOUNTER — Encounter: Payer: Self-pay | Admitting: Internal Medicine

## 2019-12-12 ENCOUNTER — Encounter: Payer: Self-pay | Admitting: Radiology

## 2019-12-12 ENCOUNTER — Other Ambulatory Visit: Payer: Self-pay

## 2019-12-12 ENCOUNTER — Emergency Department: Payer: Medicare Other

## 2019-12-12 ENCOUNTER — Emergency Department
Admission: EM | Admit: 2019-12-12 | Discharge: 2019-12-12 | Disposition: A | Discharge status: Home / Self Care | Payer: Medicare Other | Attending: Emergency Medicine | Admitting: Emergency Medicine

## 2019-12-12 DIAGNOSIS — Z79899 Other long term (current) drug therapy: Secondary | ICD-10-CM | POA: Insufficient documentation

## 2019-12-12 DIAGNOSIS — Z7984 Long term (current) use of oral hypoglycemic drugs: Secondary | ICD-10-CM | POA: Diagnosis not present

## 2019-12-12 DIAGNOSIS — E119 Type 2 diabetes mellitus without complications: Secondary | ICD-10-CM | POA: Insufficient documentation

## 2019-12-12 DIAGNOSIS — R1084 Generalized abdominal pain: Secondary | ICD-10-CM | POA: Insufficient documentation

## 2019-12-12 DIAGNOSIS — I2510 Atherosclerotic heart disease of native coronary artery without angina pectoris: Secondary | ICD-10-CM | POA: Insufficient documentation

## 2019-12-12 DIAGNOSIS — I110 Hypertensive heart disease with heart failure: Secondary | ICD-10-CM | POA: Insufficient documentation

## 2019-12-12 DIAGNOSIS — I5032 Chronic diastolic (congestive) heart failure: Secondary | ICD-10-CM | POA: Insufficient documentation

## 2019-12-12 DIAGNOSIS — R531 Weakness: Secondary | ICD-10-CM | POA: Diagnosis present

## 2019-12-12 DIAGNOSIS — Z950 Presence of cardiac pacemaker: Secondary | ICD-10-CM | POA: Insufficient documentation

## 2019-12-12 DIAGNOSIS — R300 Dysuria: Secondary | ICD-10-CM | POA: Diagnosis not present

## 2019-12-12 LAB — CBC
HCT: 40.6 % (ref 36.0–46.0)
Hemoglobin: 12.5 g/dL (ref 12.0–15.0)
MCH: 25.8 pg — ABNORMAL LOW (ref 26.0–34.0)
MCHC: 30.8 g/dL (ref 30.0–36.0)
MCV: 83.9 fL (ref 80.0–100.0)
Platelets: 365 10*3/uL (ref 150–400)
RBC: 4.84 MIL/uL (ref 3.87–5.11)
RDW: 15.3 % (ref 11.5–15.5)
WBC: 10.6 10*3/uL — ABNORMAL HIGH (ref 4.0–10.5)
nRBC: 0 % (ref 0.0–0.2)

## 2019-12-12 LAB — URINALYSIS, COMPLETE (UACMP) WITH MICROSCOPIC
Bacteria, UA: NONE SEEN
Bilirubin Urine: NEGATIVE
Glucose, UA: NEGATIVE mg/dL
Hgb urine dipstick: NEGATIVE
Ketones, ur: NEGATIVE mg/dL
Leukocytes,Ua: NEGATIVE
Nitrite: NEGATIVE
Protein, ur: 30 mg/dL — AB
Specific Gravity, Urine: 1.025 (ref 1.005–1.030)
pH: 5 (ref 5.0–8.0)

## 2019-12-12 LAB — TROPONIN I (HIGH SENSITIVITY)
Troponin I (High Sensitivity): 9 ng/L (ref <18)
Troponin I (High Sensitivity): 12 ng/L (ref <18)

## 2019-12-12 LAB — COMPREHENSIVE METABOLIC PANEL
ALT: 15 U/L (ref 0–44)
AST: 32 U/L (ref 15–41)
Albumin: 3.6 g/dL (ref 3.5–5.0)
Alkaline Phosphatase: 72 U/L (ref 38–126)
Anion gap: 14 (ref 5–15)
BUN: 22 mg/dL (ref 8–23)
CO2: 24 mmol/L (ref 22–32)
Calcium: 9.5 mg/dL (ref 8.9–10.3)
Chloride: 97 mmol/L — ABNORMAL LOW (ref 98–111)
Creatinine, Ser: 1.26 mg/dL — ABNORMAL HIGH (ref 0.44–1.00)
GFR calc Af Amer: 47 mL/min — ABNORMAL LOW (ref >60)
GFR calc non Af Amer: 40 mL/min — ABNORMAL LOW (ref >60)
Glucose, Bld: 147 mg/dL — ABNORMAL HIGH (ref 70–99)
Potassium: 4.1 mmol/L (ref 3.5–5.1)
Sodium: 135 mmol/L (ref 135–145)
Total Bilirubin: 0.7 mg/dL (ref 0.3–1.2)
Total Protein: 8 g/dL (ref 6.5–8.1)

## 2019-12-12 MED ORDER — MORPHINE SULFATE (PF) 2 MG/ML IV SOLN
2.0000 mg | Freq: Once | INTRAVENOUS | Status: AC
  Administered 2019-12-12: 16:00:00 2 mg via INTRAVENOUS
  Filled 2019-12-12: qty 1

## 2019-12-12 MED ORDER — IOHEXOL 300 MG/ML  SOLN
75.0000 mL | Freq: Once | INTRAMUSCULAR | Status: AC | PRN
  Administered 2019-12-12: 75 mL via INTRAVENOUS

## 2019-12-12 NOTE — ED Notes (Addendum)
Pt back from ct

## 2019-12-12 NOTE — ED Triage Notes (Signed)
Pt arrives via ems from home, pt reports generalized weakness for the past few days, states that she was attempting to go to the bathroom where she began to feel near syncopal and husband assisted her back to bed, ems concerned that pt appears more pale than original presentation and also concerned about her rhythm changing in route, states that her pacemaker is now firing

## 2019-12-12 NOTE — ED Notes (Signed)
Pt called husband- states he'll be here in 20-30 minutes and will call when he's here. Pt assisted to toilet and provided diet coke per her request. Pt in wheelchair by nurses station waiting for ride.

## 2019-12-12 NOTE — ED Provider Notes (Signed)
-----------------------------------------   3:22 PM on 12/12/2019 -----------------------------------------  Blood pressure 128/60, pulse 87, temperature 98.8 F (37.1 C), temperature source Oral, resp. rate (!) 22, height 5\' 3"  (1.6 m), weight 89.5 kg, SpO2 98 %.  Assuming care from Dr. Corky Downs.  In short, Catherine [name] is a 79 y.o. female with a chief complaint of Weakness and Near Syncope .  Refer to the original H&P for additional details.  The current plan of care is to follow-up CT abdomen results for possible diverticulitis and reevaluate.  CT abdomen is negative for acute process, patient appears overall well without significant discomfort at this time.  We will plan for discharge home with PCP follow-up, counseled patient to return to the ED for new or worsening symptoms, patient agrees with plan.    Blake Divine, MD 12/12/19 [id number]

## 2019-12-12 NOTE — ED Provider Notes (Signed)
[facility] Emergency Department Provider Note   ____________________________________________    I have reviewed the triage vital signs and the nursing notes.   HISTORY  Chief Complaint Weakness and dysuria    HPI Catherine [name] is a 79 y.o. female who presents with complaints of weakness, dysuria x2 days.  She reports that she feels "wiped out ".  She is having burning with urination and cramping and spasming.  Denies vomiting or diarrhea.  No fevers reported.  No cough shortness of breath.  No significant abdominal pain.  No back pain.  Has not take anything for this.  Past Medical History:  Diagnosis Date  . Acute colitis   . Anemia   . CAD (coronary artery disease)    a. heavy calcification of the entire LAD & mod eccentric mid LAD stenosis, estimated @ 70%, mild nonobs stenosis of RCA and LCx, severely calcified Ao valve with restricted valve mobility and 2+ AI    . Cancer [facility]) 1981   breast  . CHB (complete heart block) ([place]) 08/31/2015   [place] DR model [id number] (serial number [id number] )   . Chronic diastolic congestive heart failure ([place])    a. echo 08/31/2015: EF 65-70%, nl WM, GR1DD, Ao valve stent bioprosthesis was present and functioning nl, no regurg, LA mildly dilated, PASP 46 mm Hg  . Collagen vascular disease ([place])   . Colon polyps   . Depression   . Diabetes mellitus type II   . Fibromyalgia   . HTN (hypertension)   . Hypercholesteremia   . Hypothyroidism   . IBS (irritable bowel syndrome)   . Rectal bleeding   . Rheumatoid arthritis(714.0)   . S/P TAVR (transcatheter aortic valve replacement) 08/30/2015   26 mm Edwards Sapien 3 transcatheter heart valve placed via open right transfemoral approach  . Shortness of breath dyspnea   . Stenosis of aortic valve   . Thyroid disease     Patient Active Problem List   Diagnosis Date Noted  . Polypharmacy 03/12/2019  . Generalized weakness   . Shortness of breath    . Near syncope 10/24/2018  . Thyroid disease   . Aortic valve stenosis   . Rectal bleeding   . Hyperlipidemia   . Hypertension   . Depression   . Colon polyps   . Collagen vascular disease ([place])   . CAD (coronary artery disease)   . Anemia   . Acute colitis   . Abdominal pain   . BPPV (benign paroxysmal positional vertigo), bilateral   . CVA (cerebral vascular accident) ([place])   . TIA (transient ischemic attack) 03/09/2017  . Dizziness   . Prolonged Q-T interval on ECG 03/13/2016  . Suicidal ideation 03/06/2016  . Severe recurrent major depression without psychotic features ([place])   . Sleep disturbance 02/28/2016  . Chest pain 09/24/2015  . Hypokalemia 09/07/2015  . IBS (irritable bowel syndrome)   . Cancer ([place])   . Pneumothorax 09/05/2015  . Complete heart block ([place])   . CHB (complete heart block) ([place]) 08/31/2015  . Status post transcatheter aortic valve replacement (TAVR) using bioprosthesis 08/30/2015  . Type II diabetes mellitus ([place])   . Essential hypertension   . Chronic diastolic congestive heart failure ([place])   . Rheumatoid arthritis ([place])   . Hypothyroidism   . Fibromyalgia   . Aortic valve stenosis, critical 08/22/2015  . MDD (major depressive disorder) ([place]) 05/26/2012    Past Surgical History:  Procedure Laterality  Date  . ABDOMINAL SURGERY     Intestinal surgery  . CARDIAC SURGERY    . CESAREAN SECTION     x 2  . EP IMPLANTABLE DEVICE N/A 08/31/2015   Procedure: Pacemaker Implant;  Surgeon: Will Meredith Leeds, MD; [place] (serial number [id number] ); Laterality: Right  . LEFT OOPHORECTOMY    . MASTECTOMY Left   . polyp     . self inflicted chest wound    . [place]   lower back  . TEE WITHOUT CARDIOVERSION N/A 08/30/2015   Procedure: TRANSESOPHAGEAL ECHOCARDIOGRAM (TEE);  Surgeon: Sherren Mocha, MD;  Location[place];  Service: Open Heart Surgery;  Laterality: N/A;  . TOTAL ABDOMINAL HYSTERECTOMY    .  TRANSCATHETER AORTIC VALVE REPLACEMENT, TRANSFEMORAL N/A 08/30/2015   Procedure: TRANSCATHETER AORTIC VALVE REPLACEMENT, TRANSFEMORAL;  Surgeon: Sherren Mocha, MD;  Location: [place];  Service: Open Heart Surgery;  Laterality: N/A;    Prior to Admission medications   Medication Sig Start Date End Date Taking? Authorizing Provider  aspirin EC 81 MG tablet Take 81 mg by mouth daily.    [provider]  atorvastatin (LIPITOR) 20 MG tablet Take 1 tablet (20 mg total) by mouth daily. 09/28/19   Minna Merritts, MD  budesonide (PULMICORT) 0.5 MG/2ML nebulizer solution Take 2 mLs (0.5 mg total) by nebulization 2 (two) times daily. 10/29/18 11/28/18  Mayo, Pete Pelt, MD  clonazePAM (KLONOPIN) 0.5 MG tablet Take 1 tablet (0.5 mg total) by mouth at bedtime. 09/30/19   Kathlee Nations, MD  conjugated estrogens (PREMARIN) vaginal cream Apply 0.5mg  (pea-sized amount)  just inside the vaginal introitus with a finger-tip on  Monday, Wednesday and Friday nights. 10/13/19   Zara Council A, PA-C  furosemide (LASIX) 20 MG tablet Take 1 tablet (20 mg total) by mouth daily. Patient not taking: Reported on 07/21/2019 04/28/19 07/27/19  Minna Merritts, MD  HYDROcodone-acetaminophen (NORCO/VICODIN) 5-325 MG tablet Take 1 tablet by mouth every 4 (four) hours as needed for moderate pain. 09/12/19   Cuthriell, Charline Bills, PA-C  levothyroxine (SYNTHROID, LEVOTHROID) 100 MCG tablet Take 100 mcg by mouth daily before breakfast.     [provider]  losartan (COZAAR) 50 MG tablet Take 1 tablet (50 mg total) by mouth as directed. Take HALF tablet (25 mg) for one week then increase to whole tablet (50 mg) if BP remains high. 03/12/19 06/10/19  Minna Merritts, MD  mirabegron ER (MYRBETRIQ) 25 MG TB24 tablet Take 1 tablet (25 mg total) by mouth daily. 07/21/19   McGowan, Larene Beach A, PA-C  nitroGLYCERIN (NITROSTAT) 0.4 MG SL tablet PLACE 1 TABLET (0.4 MG TOTAL) UNDER THE TONGUE EVERY 5 (FIVE) MINUTES AS NEEDED FOR  CHEST PAIN. 11/17/19   Minna Merritts, MD  ondansetron (ZOFRAN ODT) 4 MG disintegrating tablet Take 1 tablet (4 mg total) by mouth every 8 (eight) hours as needed. 11/10/19   Lavonia Drafts, MD  PARoxetine (PAXIL) 20 MG tablet Take 1 tablet (20 mg total) by mouth daily. 09/30/19 09/29/20  Arfeen, Arlyce Harman, MD  polyethylene glycol powder (GLYCOLAX/MIRALAX) 17 GM/SCOOP powder Take 17 g by mouth daily as needed for moderate constipation. 06/15/19   Harvest Dark, MD  potassium chloride (KLOR-CON) 10 MEQ tablet TAKE 1 TABLET BY MOUTH EVERY DAY TAKE AND EXTRA TABLET ON DAYS WHEN YOU TAKE AN EXTRA LASIX 12/09/19   Dunn, Areta Haber, PA-C  predniSONE (DELTASONE) 50 MG tablet Take 1 tablet (50 mg  total) by mouth daily with breakfast. 09/12/19   Cuthriell, Charline Bills, PA-C  traZODone (DESYREL) 50 MG tablet Take 1/2 to one tab at bed time. 09/30/19   Arfeen, Arlyce Harman, MD     Allergies Sulfa antibiotics and Latex  Family History  Problem Relation Age of Onset  . Depression Sister   . Depression Sister   . Depression Sister   . Hypertension Mother   . Lung cancer Sister   . Stroke Sister   . Hypertension Sister   . Heart attack Neg Hx     Social History Social History   Tobacco Use  . Smoking status: Never Smoker  . Smokeless tobacco: Never Used  Substance Use Topics  . Alcohol use: No    Alcohol/week: 0.0 standard drinks  . Drug use: No    Review of Systems  Constitutional: No fevers Eyes: No visual changes.  ENT: No sore throat. Cardiovascular: Denies chest pain. Respiratory: Denies shortness of breath. Gastrointestinal: No vomiting Genitourinary: As above Musculoskeletal: Negative for joint swelling Skin: Negative for rash. Neurological: Negative for headache   ____________________________________________   PHYSICAL EXAM:  VITAL SIGNS: ED Triage Vitals  Enc Vitals Group     BP 12/12/19 1147 133/75     Pulse Rate 12/12/19 1147 (!) 107     Resp 12/12/19 1147 20     Temp  12/12/19 1147 98.8 F (37.1 C)     Temp Source 12/12/19 1147 Oral     SpO2 12/12/19 1147 98 %     Weight 12/12/19 1235 89.5 kg (197 lb 5 oz)     Height 12/12/19 1235 1.6 m (5\' 3" )     Head Circumference --      Peak Flow --      Pain Score 12/12/19 1356 8     Pain Loc --      Pain Edu? --      Excl. in [place]? --     Constitutional: Alert and oriented.   Nose: No congestion/rhinnorhea. Mouth/Throat: Mucous membranes are moist.    Cardiovascular: Normal rate, regular rhythm. Good peripheral circulation. Respiratory: Normal respiratory effort.  No retractions.  Gastrointestinal: Soft and nontender. No distention.   Musculoskeletal:  Warm and well perfused Neurologic:  Normal speech and language. No gross focal neurologic deficits are appreciated.  Skin:  Skin is warm, dry and intact. No rash noted. Psychiatric: Mood and affect are normal. Speech and behavior are normal.  ____________________________________________   LABS (all labs ordered are listed, but only abnormal results are displayed)  Labs Reviewed  CBC - Abnormal; Notable for the following components:      Result Value   WBC 10.6 (*)    MCH 25.8 (*)    All other components within normal limits  COMPREHENSIVE METABOLIC PANEL - Abnormal; Notable for the following components:   Chloride 97 (*)    Glucose, Bld 147 (*)    Creatinine, Ser 1.26 (*)    GFR calc non Af Amer 40 (*)    GFR calc Af Amer 47 (*)    All other components within normal limits  URINALYSIS, COMPLETE (UACMP) WITH MICROSCOPIC - Abnormal; Notable for the following components:   Color, Urine YELLOW (*)    APPearance HAZY (*)    Protein, ur 30 (*)    All other components within normal limits  URINE CULTURE  TROPONIN I (HIGH SENSITIVITY)  TROPONIN I (HIGH SENSITIVITY)   ____________________________________________  EKG  ED ECG REPORT I, Lavonia Drafts, the attending  physician, personally viewed and interpreted this ECG.  Date:  12/12/2019  Rhythm: Sinus tachycardia QRS Axis: normal Intervals: normal ST/T Wave abnormalities: Abnormal   ____________________________________________  RADIOLOGY  Chest x-ray unremarkable ____________________________________________   PROCEDURES  Procedure(s) performed: No  Procedures   Critical Care performed: No ____________________________________________   INITIAL IMPRESSION / ASSESSMENT AND PLAN / ED COURSE  Pertinent labs & imaging results that were available during my care of the patient were reviewed by me and considered in my medical decision making (see chart for details).  Patient overall well-appearing in no acute distress.  Vital signs are unremarkable.  Abdominal exam is reassuring, no CVA tenderness.  We will check labs, urinalysis, chest x-ray and reevaluate.  Lab work quite reassuring  Urinalysis unremarkable, chest x-ray unremarkable.  ----------------------------------------- 3:22 PM on 12/12/2019 -----------------------------------------  Patient reports pain is more in the left lower quadrant now, she does report a history of diverticulitis, will obtain CT imaging, give low-dose IV morphine for discomfort    ____________________________________________   FINAL CLINICAL IMPRESSION(S) / ED DIAGNOSES  Final diagnoses:  Generalized weakness  Generalized abdominal pain        Note:  This document was prepared using Dragon voice recognition software and may include unintentional dictation errors.   Lavonia Drafts, MD 12/13/19 1759

## 2019-12-12 NOTE — ED Notes (Signed)
Pt is c/o left lower abd pain as well as painful urination, pt states her last bm was last pm and reports that it was black, states that she was diagnosed last visit with a hemorrhoid

## 2019-12-13 LAB — URINE CULTURE: Culture: NO GROWTH

## 2019-12-14 ENCOUNTER — Encounter (HOSPITAL_COMMUNITY): Payer: Self-pay | Admitting: Emergency Medicine

## 2019-12-14 ENCOUNTER — Other Ambulatory Visit: Payer: Self-pay

## 2019-12-14 ENCOUNTER — Emergency Department (HOSPITAL_COMMUNITY)
Admission: EM | Admit: 2019-12-14 | Discharge: 2019-12-14 | Disposition: A | Discharge status: Home / Self Care | Payer: Medicare Other | Attending: Emergency Medicine | Admitting: Emergency Medicine

## 2019-12-14 ENCOUNTER — Ambulatory Visit: Payer: Medicare Other | Admitting: Family

## 2019-12-14 DIAGNOSIS — I2510 Atherosclerotic heart disease of native coronary artery without angina pectoris: Secondary | ICD-10-CM | POA: Diagnosis not present

## 2019-12-14 DIAGNOSIS — K589 Irritable bowel syndrome without diarrhea: Secondary | ICD-10-CM | POA: Diagnosis not present

## 2019-12-14 DIAGNOSIS — Z7984 Long term (current) use of oral hypoglycemic drugs: Secondary | ICD-10-CM | POA: Insufficient documentation

## 2019-12-14 DIAGNOSIS — R300 Dysuria: Secondary | ICD-10-CM | POA: Diagnosis not present

## 2019-12-14 DIAGNOSIS — I119 Hypertensive heart disease without heart failure: Secondary | ICD-10-CM | POA: Diagnosis not present

## 2019-12-14 DIAGNOSIS — E119 Type 2 diabetes mellitus without complications: Secondary | ICD-10-CM | POA: Diagnosis not present

## 2019-12-14 DIAGNOSIS — Z79899 Other long term (current) drug therapy: Secondary | ICD-10-CM | POA: Diagnosis not present

## 2019-12-14 DIAGNOSIS — R109 Unspecified abdominal pain: Secondary | ICD-10-CM

## 2019-12-14 DIAGNOSIS — R1032 Left lower quadrant pain: Secondary | ICD-10-CM | POA: Diagnosis present

## 2019-12-14 LAB — URINALYSIS, ROUTINE W REFLEX MICROSCOPIC
Bilirubin Urine: NEGATIVE
Glucose, UA: 150 mg/dL — AB
Hgb urine dipstick: NEGATIVE
Ketones, ur: NEGATIVE mg/dL
Nitrite: NEGATIVE
Protein, ur: NEGATIVE mg/dL
Specific Gravity, Urine: 1.016 (ref 1.005–1.030)
pH: 6 (ref 5.0–8.0)

## 2019-12-14 MED ORDER — PHENAZOPYRIDINE HCL 100 MG PO TABS: 100.0000 mg | ORAL_TABLET | Freq: Three times a day (TID) | ORAL | 0 refills | Status: DC | PRN

## 2019-12-14 MED ORDER — ONDANSETRON 4 MG PO TBDP
4.0000 mg | ORAL_TABLET | Freq: Once | ORAL | Status: AC
  Administered 2019-12-14: 4 mg via ORAL
  Filled 2019-12-14: qty 1

## 2019-12-14 MED ORDER — PHENAZOPYRIDINE HCL 100 MG PO TABS
200.0000 mg | ORAL_TABLET | Freq: Once | ORAL | Status: AC
  Administered 2019-12-14: 200 mg via ORAL
  Filled 2019-12-14: qty 2

## 2019-12-14 NOTE — ED Triage Notes (Signed)
Left abd pain hx of very recent UTI ,tx at [facility] for  Near syncopal 2 days ago  Pt has had painful urination and increasing weakness ,left lower quad pain

## 2019-12-14 NOTE — Discharge Instructions (Addendum)
I reviewed all of your test from 2 days ago.  No life-threatening conditions were identified.  I will prescribe a medicine for urinary pain.  This was sent to your pharmacy at home.  Follow-up with your primary care doctor.

## 2019-12-14 NOTE — ED Provider Notes (Signed)
[facility] EMERGENCY DEPARTMENT Provider Note   CSN: [id number] Arrival date & time: 12/14/19  [id number]     History Chief Complaint  Patient presents with  . Abdominal Pain    Catherine [name] is a 79 y.o. female.  Chief complaint: Dysuria and left flank pain with minimal radiation to left lower quadrant for 2 days.  She was evaluated in the emergency department on 12/12/2019 with a negative CT of the abdomen pelvis, negative portable chest x-ray, no evidence of a urinary tract infection and reasonable labs.  She has been eating and getting around the house.  No fever, chills, hematuria, cough, chest pain, dyspnea.  Severity of symptoms is mild.  Nothing makes symptoms better or worse.        Past Medical History:  Diagnosis Date  . Acute colitis   . Anemia   . CAD (coronary artery disease)    a. heavy calcification of the entire LAD & mod eccentric mid LAD stenosis, estimated @ 70%, mild nonobs stenosis of RCA and LCx, severely calcified Ao valve with restricted valve mobility and 2+ AI    . Cancer [facility]) 1981   breast  . CHB (complete heart block) ([place]) 08/31/2015   [place] DR model [id number] (serial number [id number] )   . Chronic diastolic congestive heart failure ([place])    a. echo 08/31/2015: EF 65-70%, nl WM, GR1DD, Ao valve stent bioprosthesis was present and functioning nl, no regurg, LA mildly dilated, PASP 46 mm Hg  . Collagen vascular disease ([place])   . Colon polyps   . Depression   . Diabetes mellitus type II   . Fibromyalgia   . HTN (hypertension)   . Hypercholesteremia   . Hypothyroidism   . IBS (irritable bowel syndrome)   . Rectal bleeding   . Rheumatoid arthritis(714.0)   . S/P TAVR (transcatheter aortic valve replacement) 08/30/2015   26 mm Edwards Sapien 3 transcatheter heart valve placed via open right transfemoral approach  . Shortness of breath dyspnea   . Stenosis of aortic valve   . Thyroid disease     Patient Active  Problem List   Diagnosis Date Noted  . Polypharmacy 03/12/2019  . Generalized weakness   . Shortness of breath   . Near syncope 10/24/2018  . Thyroid disease   . Aortic valve stenosis   . Rectal bleeding   . Hyperlipidemia   . Hypertension   . Depression   . Colon polyps   . Collagen vascular disease ([place])   . CAD (coronary artery disease)   . Anemia   . Acute colitis   . Abdominal pain   . BPPV (benign paroxysmal positional vertigo), bilateral   . CVA (cerebral vascular accident) ([place])   . TIA (transient ischemic attack) 03/09/2017  . Dizziness   . Prolonged Q-T interval on ECG 03/13/2016  . Suicidal ideation 03/06/2016  . Severe recurrent major depression without psychotic features ([place])   . Sleep disturbance 02/28/2016  . Chest pain 09/24/2015  . Hypokalemia 09/07/2015  . IBS (irritable bowel syndrome)   . Cancer ([place])   . Pneumothorax 09/05/2015  . Complete heart block ([place])   . CHB (complete heart block) ([place]) 08/31/2015  . Status post transcatheter aortic valve replacement (TAVR) using bioprosthesis 08/30/2015  . Type II diabetes mellitus ([place])   . Essential hypertension   . Chronic diastolic congestive heart failure ([place])   . Rheumatoid arthritis ([place])   . Hypothyroidism   . Fibromyalgia   .  Aortic valve stenosis, critical 08/22/2015  . MDD (major depressive disorder) ([place]) 05/26/2012    Past Surgical History:  Procedure Laterality Date  . ABDOMINAL SURGERY     Intestinal surgery  . CARDIAC SURGERY    . CESAREAN SECTION     x 2  . EP IMPLANTABLE DEVICE N/A 08/31/2015   Procedure: Pacemaker Implant;  Surgeon: Will Meredith Leeds, MD; [place] (serial number [id number] ); Laterality: Right  . LEFT OOPHORECTOMY    . MASTECTOMY Left   . polyp     . self inflicted chest wound    . [place]   lower back  . TEE WITHOUT CARDIOVERSION N/A 08/30/2015   Procedure: TRANSESOPHAGEAL ECHOCARDIOGRAM (TEE);  Surgeon: Sherren Mocha, MD;  Location: [place];  Service: Open Heart Surgery;  Laterality: N/A;  . TOTAL ABDOMINAL HYSTERECTOMY    . TRANSCATHETER AORTIC VALVE REPLACEMENT, TRANSFEMORAL N/A 08/30/2015   Procedure: TRANSCATHETER AORTIC VALVE REPLACEMENT, TRANSFEMORAL;  Surgeon: Sherren Mocha, MD;  Location: [place];  Service: Open Heart Surgery;  Laterality: N/A;     OB History   No obstetric history on file.     Family History  Problem Relation Age of Onset  . Depression Sister   . Depression Sister   . Depression Sister   . Hypertension Mother   . Lung cancer Sister   . Stroke Sister   . Hypertension Sister   . Heart attack Neg Hx     Social History   Tobacco Use  . Smoking status: Never Smoker  . Smokeless tobacco: Never Used  Substance Use Topics  . Alcohol use: No    Alcohol/week: 0.0 standard drinks  . Drug use: No    Home Medications Prior to Admission medications   Medication Sig Start Date End Date Taking? Authorizing Provider  aspirin EC 81 MG tablet Take 81 mg by mouth daily.    [provider]  atorvastatin (LIPITOR) 20 MG tablet Take 1 tablet (20 mg total) by mouth daily. 09/28/19   Minna Merritts, MD  budesonide (PULMICORT) 0.5 MG/2ML nebulizer solution Take 2 mLs (0.5 mg total) by nebulization 2 (two) times daily. 10/29/18 11/28/18  Mayo, Pete Pelt, MD  clonazePAM (KLONOPIN) 0.5 MG tablet Take 1 tablet (0.5 mg total) by mouth at bedtime. 09/30/19   Kathlee Nations, MD  conjugated estrogens (PREMARIN) vaginal cream Apply 0.5mg  (pea-sized amount)  just inside the vaginal introitus with a finger-tip on  Monday, Wednesday and Friday nights. 10/13/19   Zara Council A, PA-C  furosemide (LASIX) 20 MG tablet Take 1 tablet (20 mg total) by mouth daily. Patient not taking: Reported on 07/21/2019 04/28/19 07/27/19  Minna Merritts, MD  HYDROcodone-acetaminophen (NORCO/VICODIN) 5-325 MG tablet Take 1 tablet by mouth every 4 (four) hours as needed for moderate pain. 09/12/19    Cuthriell, Charline Bills, PA-C  levothyroxine (SYNTHROID, LEVOTHROID) 100 MCG tablet Take 100 mcg by mouth daily before breakfast.     [provider]  losartan (COZAAR) 50 MG tablet Take 1 tablet (50 mg total) by mouth as directed. Take HALF tablet (25 mg) for one week then increase to whole tablet (50 mg) if BP remains high. 03/12/19 06/10/19  Minna Merritts, MD  mirabegron ER (MYRBETRIQ) 25 MG TB24 tablet Take 1 tablet (25 mg total) by mouth daily. 07/21/19   McGowan, Larene Beach A, PA-C  nitroGLYCERIN (NITROSTAT) 0.4 MG SL tablet PLACE 1 TABLET (0.4 MG TOTAL) UNDER THE TONGUE EVERY  5 (FIVE) MINUTES AS NEEDED FOR CHEST PAIN. 11/17/19   Minna Merritts, MD  ondansetron (ZOFRAN ODT) 4 MG disintegrating tablet Take 1 tablet (4 mg total) by mouth every 8 (eight) hours as needed. 11/10/19   Lavonia Drafts, MD  PARoxetine (PAXIL) 20 MG tablet Take 1 tablet (20 mg total) by mouth daily. 09/30/19 09/29/20  Arfeen, Arlyce Harman, MD  phenazopyridine (PYRIDIUM) 100 MG tablet Take 1 tablet (100 mg total) by mouth 3 (three) times daily as needed for pain. 12/14/19   Nat Christen, MD  polyethylene glycol powder [facility]) 17 GM/SCOOP powder Take 17 g by mouth daily as needed for moderate constipation. 06/15/19   Harvest Dark, MD  potassium chloride (KLOR-CON) 10 MEQ tablet TAKE 1 TABLET BY MOUTH EVERY DAY TAKE AND EXTRA TABLET ON DAYS WHEN YOU TAKE AN EXTRA LASIX 12/09/19   Dunn, Areta Haber, PA-C  predniSONE (DELTASONE) 50 MG tablet Take 1 tablet (50 mg total) by mouth daily with breakfast. 09/12/19   Cuthriell, Charline Bills, PA-C  traZODone (DESYREL) 50 MG tablet Take 1/2 to one tab at bed time. 09/30/19   Arfeen, Arlyce Harman, MD    Allergies    Sulfa antibiotics and Latex  Review of Systems   Review of Systems  All other systems reviewed and are negative.   Physical Exam Updated Vital Signs BP (!) 156/75   Pulse 84   Resp 17   SpO2 98%   Physical Exam Vitals and nursing note reviewed.  Constitutional:       Appearance: She is well-developed.  HENT:     Head: Normocephalic and atraumatic.  Eyes:     Conjunctiva/sclera: Conjunctivae normal.  Cardiovascular:     Rate and Rhythm: Normal rate and regular rhythm.  Pulmonary:     Effort: Pulmonary effort is normal.     Breath sounds: Normal breath sounds.  Abdominal:     General: Bowel sounds are normal.     Palpations: Abdomen is soft.  Genitourinary:    Comments: Minimal left flank tenderness. Musculoskeletal:        General: Normal range of motion.     Cervical back: Neck supple.  Skin:    General: Skin is warm and dry.  Neurological:     General: No focal deficit present.     Mental Status: She is alert and oriented to person, place, and time.  Psychiatric:        Behavior: Behavior normal.     ED Results / Procedures / Treatments   Labs (all labs ordered are listed, but only abnormal results are displayed) Labs Reviewed  URINALYSIS, ROUTINE W REFLEX MICROSCOPIC - Abnormal; Notable for the following components:      Result Value   APPearance HAZY (*)    Glucose, UA 150 (*)    Leukocytes,Ua TRACE (*)    Bacteria, UA RARE (*)    All other components within normal limits    EKG None  Radiology CT ABDOMEN PELVIS W CONTRAST  Result Date: 12/12/2019 CLINICAL DATA:  Diverticulitis. Left lower abdominal pain. EXAM: CT ABDOMEN AND PELVIS WITH CONTRAST TECHNIQUE: Multidetector CT imaging of the abdomen and pelvis was performed using the standard protocol following bolus administration of intravenous contrast. CONTRAST:  37mL OMNIPAQUE IOHEXOL 300 MG/ML  SOLN COMPARISON:  November 19, 2019. FINDINGS: Lower chest: The lung bases are clear. The heart is enlarged. Hepatobiliary: The liver is normal. Normal gallbladder.There is no biliary ductal dilation. Pancreas: Normal contours without ductal dilatation. No peripancreatic fluid collection.  Spleen: No splenic laceration or hematoma. Adrenals/Urinary Tract: --Adrenal glands: No  adrenal hemorrhage. --Right kidney/ureter: There are nonobstructing stones involving the right kidney. There is no right-sided hydronephrosis. --Left kidney/ureter: No hydronephrosis or perinephric hematoma. --Urinary bladder: Unremarkable. Stomach/Bowel: --Stomach/Duodenum: No hiatal hernia or other gastric abnormality. Normal duodenal course and caliber. --Small bowel: No dilatation or inflammation. --Colon: No focal abnormality. --Appendix: Normal. Vascular/Lymphatic: Atherosclerotic calcification is present within the non-aneurysmal abdominal aorta, without hemodynamically significant stenosis. --No retroperitoneal lymphadenopathy. --No mesenteric lymphadenopathy. --No pelvic or inguinal lymphadenopathy. Reproductive: Status post hysterectomy. No adnexal mass. Other: No ascites or free air. The abdominal wall is normal. Musculoskeletal. No acute displaced fractures. IMPRESSION: 1. No acute abdominopelvic abnormality. 2. Nonobstructive right nephrolithiasis. Aortic Atherosclerosis (ICD10-I70.0). Electronically Signed   By: Constance Holster M.D.   On: 12/12/2019 15:54    Procedures Procedures (including critical care time)  Medications Ordered in ED Medications  ondansetron (ZOFRAN-ODT) disintegrating tablet 4 mg (4 mg Oral Given 12/14/19 1203)  phenazopyridine (PYRIDIUM) tablet 200 mg (200 mg Oral Given 12/14/19 1202)    ED Course  I have reviewed the triage vital signs and the nursing notes.  Pertinent labs & imaging results that were available during my care of the patient were reviewed by me and considered in my medical decision making (see chart for details).    MDM Rules/Calculators/A&P                      Patient is in no acute distress.  Normal vital signs.  Good color.  She is mentating well.  All her tests from 2 days ago were reviewed.  No red flags were identified.  Urinalysis today shows no evidence of infection.  She was rechecked prior to discharge with a normal exam.  Will Rx  Pyridium 100 mg for dysuria. Final Clinical Impression(s) / ED Diagnoses Final diagnoses:  Left flank pain    Rx / DC Orders ED Discharge Orders         Ordered    phenazopyridine (PYRIDIUM) 100 MG tablet  3 times daily PRN     12/14/19 1526           Nat Christen, MD 12/14/19 1531

## 2019-12-15 ENCOUNTER — Encounter: Payer: Self-pay | Admitting: Cardiovascular Disease

## 2019-12-30 ENCOUNTER — Telehealth: Payer: Self-pay | Admitting: Urology

## 2019-12-30 NOTE — Telephone Encounter (Signed)
Spoke to patient and informed her that she will need to come in for office visit. Appointment has been scheduled.

## 2019-12-30 NOTE — Telephone Encounter (Signed)
Pt's daughter called and asked if she could r/s her last appt to a Virtual and come drop of a UA.

## 2019-12-31 ENCOUNTER — Other Ambulatory Visit: Payer: Self-pay

## 2019-12-31 ENCOUNTER — Encounter (HOSPITAL_COMMUNITY): Payer: Self-pay | Admitting: Psychiatry

## 2019-12-31 ENCOUNTER — Ambulatory Visit (INDEPENDENT_AMBULATORY_CARE_PROVIDER_SITE_OTHER): Payer: Medicare Other | Admitting: Psychiatry

## 2019-12-31 DIAGNOSIS — F331 Major depressive disorder, recurrent, moderate: Secondary | ICD-10-CM | POA: Diagnosis not present

## 2019-12-31 DIAGNOSIS — F411 Generalized anxiety disorder: Secondary | ICD-10-CM

## 2019-12-31 MED ORDER — PAROXETINE HCL 20 MG PO TABS: 20.0000 mg | ORAL_TABLET | Freq: Every day | ORAL | 2 refills | Status: DC

## 2019-12-31 MED ORDER — TRAZODONE HCL 50 MG PO TABS: ORAL_TABLET | ORAL | 1 refills | Status: DC

## 2019-12-31 MED ORDER — CLONAZEPAM 0.5 MG PO TABS: 0.5000 mg | ORAL_TABLET | Freq: Every day | ORAL | 2 refills | Status: DC
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
## 2019-12-31 NOTE — Progress Notes (Signed)
Virtual Visit via Telephone Note   This visit type was conducted due to national recommendations for restrictions regarding the COVID-19 Pandemic (e.g. social distancing) in an effort to limit this patient's exposure and mitigate transmission in our community.  Due to her co-morbid illnesses, this patient is at least at moderate risk for complications without adequate follow up.  This format is felt to be most appropriate for this patient at this time.  The patient did not have access to video technology/had technical difficulties with video requiring transitioning to audio format only (telephone).  All issues noted in this document were discussed and addressed.  No physical exam could be performed with this format.  Please refer to the patient's chart for her  consent to telehealth for [facility].   Date:  01/05/2020   ID:  Catherine [name], DOB 12-28-1940, MRN [id number]  Patient Location: Home Provider Location: Office  PCP:  Danelle Berry, NP  Cardiologist:  Ida Rogue, MD  Electrophysiologist:  None   Evaluation Performed:  Follow-Up Visit  Chief Complaint:  Follow up  History of Present Illness:    Catherine [name] is a 79 y.o. female with CAD medically managed as below, aortic stenosis s/p TAVR in [id number] complicated by complete heart block s/p PPM complicated byhemothorax requiring chest tube, HFpEF, venous insufficiency, DM2, HTN, HLD, anemia with gross hematuria, hypothyroidism, RA, IBS, and obesitywhopresents for follow-up of the above.  Ms.Welkerunderwent cath in 08/2015 showed single vessel CAD with heavy calcification of the entire LAD and a moderate eccentric mid LAD stenosis estimated at 70%. There was mild nonobstructive stenosis of the LCx and RCA. She had a severely calcified aortic valve with restricted mobility and 2+ aortic insufficiency with known severe aortic stenosis by prior noninvasive evaluation. Medical management was advised for her CAD along with  treatment of her aortic valve disease. She underwent TAVR in [id number] which was complicated by complete heart block requiring PPM. Her PPM implantation was complicated by a hemothorax requiring chest tube placement. She was last seen by Dr. Burt Knack in 01/2018 and was doing well from a cardiac perspective.Echo from 03/2017 showed and EF of 60-65%, no RWMA, Gr1DD, normal functioning aortic valve replacement without stenosis or regurgitation.   She was admitted to [facility] in 10/2018 with lightheadedness, presyncope, shortness of breath, and hematuria. It was noted her home BP cuff was giving her erroneous heart rates prompting her to become anxious. Her pacemaker was interrogated in the ED with the patient and her family being told by the ED physician that her pacemaker was malfunctioning for unclear reasons. Upon multiple cardiology physicians and pacemaker rep reviewing the device and print out it was found out the patient's pacemaker was working appropriately. It was noted the patient had 2 brief episodes of atrial tachycardia earlier in the week though no significant events leading up to her admission. Echo during that admission showed an EF of 50 to 55%, no regional wall motion abnormalities, mild concentric LVH, grade 1 diastolic dysfunction, a bioprosthetic aortic valve was present with a mean gradient of 6 mmHg, moderately dilated left atrium, RV systolic function was normal, mildly dilated right atrium, mild tricuspid regurgitation, PASP normal. She was seen by cardiology for weakness and malaise which was felt to be secondary to deconditioning and underlying anemia. Cardiology was reconsulted later during the admission as the patient insisted her device was malfunctioning. Repeat interrogation again showed the device was functioning appropriately.It was recommended to hold lisinopril and metoprolol in the  setting of orthostatic hypotension. It was also considered to have the pharmacy review her  medicines as polypharmacy was felt to possibly be contributing to her symptoms, in particular she was on multiple psychotropic/sedating medications.  She was seen in telemedicine follow up noting poorly controlled BP, leg weakness, and chest pain on 03/02/2019. Weight was documented to be 199 pounds. She was felt to be euvolemic. She was restarted on metoprolol. In telemedicine follow up on 4/8, she noted BP ranging from the 0000000 to [id number] systolic. Weight noted to be 201 pounds. Losartan was added back at that time.   She was seen in 04/2019 with complaints of increased lower extremity swelling without dyspnea with stable weight.  Hemoglobin was improved at that time and back to her baseline.  She was continued on Lasix 20 mg daily with extra 20 mg dose for weight gain.  Since then, she has been seen in the ED 8 different times for various ailments as noted in Epic.  Including dizziness and nausea in 06/2019 with pacemaker device interrogation revealing normal device function.  CT head nonacute.  She has been seen in the ED multiple times for abdominal pain as well as hematochezia in 11/2019 with no clear explanation for her BRBPR with recommendation to follow-up with GI.  She was seen in the ED twice in 12/2019, initially for generalized malaise and fatigue with a negative high-sensitivity troponin and improved hemoglobin of 12.5.  Renal function indicated some degree of AKI with a BUN of 22 and serum creatinine 1.26 with a baseline approximately 0.8-0.9.  CT abdomen pelvis with no acute findings.  She was seen on 12/14/2019 with left flank pain with work-up being largely unrevealing.  She is seen virtually today with her primary complaint/concern related to reported nephrolithiasis, urinary urgency, incontinence, and IC.  She denies any chest pain, dyspnea, palpitations, dizziness, presyncope, syncope, abdominal distention, orthopnea, or early satiety.  She notes her lower extremity swelling is stable.  Her  weight is down 12 pounds today when compared to her last office visit.  Recent labs in the ED did show a mildly elevated serum creatinine.  She has been maintained on furosemide 20 mg daily with KCl 10 mEq daily.  She has not followed up with EP or structural heart.  She notes significant fatigue and weakness which is significantly affecting her ability to get out of her house, even to go to medical appointments.  She does have in person follow-up scheduled with urology on 2/3.  Otherwise, she does not have any cardiac concerns at this time.   Labs independently reviewed by me: 12/2019 - high-sensitivity troponin negative x2, potassium 4.1, BUN 22, serum iron 1.26, albumin 3.6, AST/ALT normal, WBC 10.6, Hgb 12.5, PLT 365 11/2019 - BNP 205  The patient does not have symptoms concerning for COVID-19 infection (fever, chills, cough, or new shortness of breath).    Past Medical History:  Diagnosis Date  . Acute colitis   . Anemia   . CAD (coronary artery disease)    a. heavy calcification of the entire LAD & mod eccentric mid LAD stenosis, estimated @ 70%, mild nonobs stenosis of RCA and LCx, severely calcified Ao valve with restricted valve mobility and 2+ AI    . Cancer [facility]) 1981   breast  . CHB (complete heart block) ([place]) 08/31/2015   [place] DR model [id number] (serial number [id number] )   . Chronic diastolic congestive heart failure ([place])    a. echo 08/31/2015:  EF 65-70%, nl WM, GR1DD, Ao valve stent bioprosthesis was present and functioning nl, no regurg, LA mildly dilated, PASP 46 mm Hg  . Collagen vascular disease ([place])   . Colon polyps   . Depression   . Diabetes mellitus type II   . Fibromyalgia   . HTN (hypertension)   . Hypercholesteremia   . Hypothyroidism   . IBS (irritable bowel syndrome)   . Rectal bleeding   . Rheumatoid arthritis(714.0)   . S/P TAVR (transcatheter aortic valve replacement) 08/30/2015   26 mm Edwards Sapien 3 transcatheter heart valve placed  via open right transfemoral approach  . Shortness of breath dyspnea   . Stenosis of aortic valve   . Thyroid disease    Past Surgical History:  Procedure Laterality Date  . ABDOMINAL SURGERY     Intestinal surgery  . CARDIAC SURGERY    . CESAREAN SECTION     x 2  . EP IMPLANTABLE DEVICE N/A 08/31/2015   Procedure: Pacemaker Implant;  Surgeon: Will Meredith Leeds, MD; [place] (serial number [id number] ); Laterality: Right  . LEFT OOPHORECTOMY    . MASTECTOMY Left   . polyp     . self inflicted chest wound    . [place]   lower back  . TEE WITHOUT CARDIOVERSION N/A 08/30/2015   Procedure: TRANSESOPHAGEAL ECHOCARDIOGRAM (TEE);  Surgeon: Sherren Mocha, MD;  Location: [place];  Service: Open Heart Surgery;  Laterality: N/A;  . TOTAL ABDOMINAL HYSTERECTOMY    . TRANSCATHETER AORTIC VALVE REPLACEMENT, TRANSFEMORAL N/A 08/30/2015   Procedure: TRANSCATHETER AORTIC VALVE REPLACEMENT, TRANSFEMORAL;  Surgeon: Sherren Mocha, MD;  Location: [place];  Service: Open Heart Surgery;  Laterality: N/A;     Current Meds  Medication Sig  . aspirin EC 81 MG tablet Take 81 mg by mouth daily.  Marland Kitchen atorvastatin (LIPITOR) 20 MG tablet Take 1 tablet (20 mg total) by mouth daily.  . budesonide (PULMICORT) 0.5 MG/2ML nebulizer solution Take 2 mLs (0.5 mg total) by nebulization 2 (two) times daily.  . clonazePAM (KLONOPIN) 0.5 MG tablet Take 1 tablet (0.5 mg total) by mouth at bedtime.  . furosemide (LASIX) 20 MG tablet Take 1 tablet (20 mg total) by mouth daily.  Marland Kitchen levothyroxine (SYNTHROID, LEVOTHROID) 100 MCG tablet Take 100 mcg by mouth daily before breakfast.   . losartan (COZAAR) 50 MG tablet Take 1 tablet (50 mg total) by mouth as directed. Take HALF tablet (25 mg) for one week then increase to whole tablet (50 mg) if BP remains high.  . mirabegron ER (MYRBETRIQ) 25 MG TB24 tablet Take 1 tablet (25 mg total) by mouth daily.  . nitroGLYCERIN (NITROSTAT) 0.4 MG SL tablet  PLACE 1 TABLET (0.4 MG TOTAL) UNDER THE TONGUE EVERY 5 (FIVE) MINUTES AS NEEDED FOR CHEST PAIN.  Marland Kitchen ondansetron (ZOFRAN ODT) 4 MG disintegrating tablet Take 1 tablet (4 mg total) by mouth every 8 (eight) hours as needed.  Marland Kitchen PARoxetine (PAXIL) 20 MG tablet Take 1 tablet (20 mg total) by mouth daily.  . polyethylene glycol powder (GLYCOLAX/MIRALAX) 17 GM/SCOOP powder Take 17 g by mouth daily as needed for moderate constipation.  . potassium chloride (KLOR-CON) 10 MEQ tablet TAKE 1 TABLET BY MOUTH EVERY DAY TAKE AND EXTRA TABLET ON DAYS WHEN YOU TAKE AN EXTRA LASIX  . traZODone (DESYREL) 50 MG tablet Take 1/2 to one tab at bed time.     Allergies:   Sulfa antibiotics and Latex  Social History   Tobacco Use  . Smoking status: Never Smoker  . Smokeless tobacco: Never Used  Substance Use Topics  . Alcohol use: No    Alcohol/week: 0.0 standard drinks  . Drug use: No     Family Hx: The patient's family history includes Depression in her sister, sister, and sister; Hypertension in her mother and sister; Lung cancer in her sister; Stroke in her sister. There is no history of Heart attack.  ROS:   Please see the history of present illness.     All other systems reviewed and are negative.   Prior CV studies:   The following studies were reviewed today:  2D echo 10/2018: - Left ventricle: The cavity size was normal. There was mild  concentric hypertrophy. Systolic function was normal. The  estimated ejection fraction was in the range of 50% to 55%. Wall  motion was normal; there were no regional wall motion  abnormalities. Doppler parameters are consistent with abnormal  left ventricular relaxation (grade 1 diastolic dysfunction).  - Aortic valve: A Bioprosethic prosthesis was present. Peak  velocity (S): 143 cm/s. Mean gradient (S): 6 mm Hg.  - Left atrium: The atrium was moderately dilated.  - Right ventricle: Systolic function was normal.  - Right atrium: The atrium was  mildly dilated.  - Tricuspid valve: There was mild regurgitation.  - Pulmonary arteries: Systolic pressure was within the normal  range.   Labs/Other Tests and Data Reviewed:    EKG:  No ECG reviewed.  Recent Labs: 11/12/2019: B Natriuretic Peptide 205.0 12/12/2019: ALT 15; BUN 22; Creatinine, Ser 1.26; Hemoglobin 12.5; Platelets 365; Potassium 4.1; Sodium 135   Recent Lipid Panel Lab Results  Component Value Date/Time   CHOL 186 03/10/2017 04:24 AM   TRIG 147 03/10/2017 04:24 AM   HDL 43 03/10/2017 04:24 AM   CHOLHDL 4.3 03/10/2017 04:24 AM   LDLCALC 114 (H) 03/10/2017 04:24 AM    Wt Readings from Last 3 Encounters:  01/05/20 188 lb (85.3 kg)  12/12/19 197 lb 5 oz (89.5 kg)  11/18/19 197 lb 5 oz (89.5 kg)     Objective:    Vital Signs:  Ht 5\' 3"  (1.6 m)   Wt 188 lb (85.3 kg)   BMI 33.30 kg/m    VITAL SIGNS:  reviewed  ASSESSMENT & PLAN:    1. CAD involving native coronary arteries without angina: She denies any symptoms concerning for angina.  Continue current medical regimen.  No plans for ischemic evaluation at this time.  2. HFpEF/venous insufficiency: She denies any symptoms of volume overload or decompensation.  Her weight is down 12 pounds when compared to her last visit with recent labs demonstrating mild AKI.  In this setting, we have decreased her furosemide to 20 mg as well as KCl 10 mEq to every other day.  Recommend compression stockings and leg elevation.  3. Aortic stenosis status post TAVR: Update echo.  She is overdue for follow-up with structural heart clinic.  I have advised her to schedule an appointment with the structural heart team.  She remains on aspirin.  SBE prophylaxis.  4. Complete heart block: Status post PPM.  Most recent remote device interrogation stable.  I have recommended she schedule an appointment to follow-up with her electrophysiologist.  5. HTN: Most recent BP from 12/14/2019 ED visit of 156/75 in the setting of acute illness.   Her Lasix was decreased as outlined above given weight reduction and mild AKI.  Otherwise, no adjustment was  made without updated bleeding.  6. Nephrolithiasis/urinary frequency, urgency/IC: This is her primary focus of our visit today.  I advised her multiple times that her cardiology practice does not address this.  I reviewed that she had an appointment coming up with urology on 2/3 and advised her she should keep this appointment for further evaluation and management of her complaints.  7. Generalized weakness/fatigue:  She indicates this is affecting her ability to get around the house or outside of the house.  Perhaps her PCP could assist with setting up home health aide as well as home health PT.  Recent CBC showed normal hemoglobin.  COVID-19 Education: The signs and symptoms of COVID-19 were discussed with the patient and how to seek care for testing (follow up with PCP or arrange E-visit).  The importance of social distancing was discussed today.  Time:   Today, I have spent 15 minutes with the patient with telehealth technology discussing the above problems.     Medication Adjustments/Labs and Tests Ordered: Current medicines are reviewed at length with the patient today.  Concerns regarding medicines are outlined above.   Tests Ordered: No orders of the defined types were placed in this encounter.   Medication Changes: No orders of the defined types were placed in this encounter.   Follow Up:  In Person in 3 month(s) with her primary cardiologist.  Signed, Christell Faith, PA-C  01/05/2020 11:36 AM    [place]

## 2020-01-01 ENCOUNTER — Telehealth: Payer: Medicare Other | Admitting: Family

## 2020-01-05 ENCOUNTER — Other Ambulatory Visit: Payer: Self-pay | Admitting: Physician Assistant

## 2020-01-05 ENCOUNTER — Other Ambulatory Visit: Payer: Self-pay

## 2020-01-05 ENCOUNTER — Telehealth (INDEPENDENT_AMBULATORY_CARE_PROVIDER_SITE_OTHER): Payer: Medicare Other | Admitting: Physician Assistant

## 2020-01-05 VITALS — Ht 63.0 in | Wt 188.0 lb

## 2020-01-05 DIAGNOSIS — R5383 Other fatigue: Secondary | ICD-10-CM

## 2020-01-05 DIAGNOSIS — I5032 Chronic diastolic (congestive) heart failure: Secondary | ICD-10-CM

## 2020-01-05 DIAGNOSIS — I10 Essential (primary) hypertension: Secondary | ICD-10-CM

## 2020-01-05 DIAGNOSIS — Z950 Presence of cardiac pacemaker: Secondary | ICD-10-CM

## 2020-01-05 DIAGNOSIS — I359 Nonrheumatic aortic valve disorder, unspecified: Secondary | ICD-10-CM | POA: Diagnosis not present

## 2020-01-05 DIAGNOSIS — I442 Atrioventricular block, complete: Secondary | ICD-10-CM

## 2020-01-05 DIAGNOSIS — I2510 Atherosclerotic heart disease of native coronary artery without angina pectoris: Secondary | ICD-10-CM | POA: Diagnosis not present

## 2020-01-05 DIAGNOSIS — Z953 Presence of xenogenic heart valve: Secondary | ICD-10-CM

## 2020-01-05 DIAGNOSIS — R3915 Urgency of urination: Secondary | ICD-10-CM

## 2020-01-05 DIAGNOSIS — R350 Frequency of micturition: Secondary | ICD-10-CM

## 2020-01-05 DIAGNOSIS — R5381 Other malaise: Secondary | ICD-10-CM

## 2020-01-05 DIAGNOSIS — N200 Calculus of kidney: Secondary | ICD-10-CM

## 2020-01-05 MED ORDER — POTASSIUM CHLORIDE ER 10 MEQ PO TBCR: 10.0000 meq | EXTENDED_RELEASE_TABLET | ORAL | 11 refills | Status: DC

## 2020-01-05 MED ORDER — FUROSEMIDE 20 MG PO TABS: 20.0000 mg | ORAL_TABLET | ORAL | 2 refills | Status: DC

## 2020-01-05 NOTE — Patient Instructions (Addendum)
Medication Instructions:  1- CHANGE Lasix to Take 1 tablet (20 mg total) by mouth every other day 2- CHANGE K CL to Take 1 tablet (10 mEq total) by mouth every other day  *If you need a refill on your cardiac medications before your next appointment, please call your pharmacy*  Lab Work: None ordered  If you have labs (blood work) drawn today and your tests are completely normal, you will receive your results only by: Marland Kitchen MyChart Message (if you have MyChart) OR . A paper copy in the mail If you have any lab test that is abnormal or we need to change your treatment, we will call you to review the results.  Testing/Procedures: 1- Echo  Please return to [facility] on ______________ at _______________ AM/PM for an Echocardiogram. Your physician has requested that you have an echocardiogram. Echocardiography is a painless test that uses sound waves to create images of your heart. It provides your doctor with information about the size and shape of your heart and how well your heart's chambers and valves are working. This procedure takes approximately one hour. There are no restrictions for this procedure. Please note; depending on visual quality an IV may need to be placed.    Follow-Up: At [facility], you and your health needs are our priority.  As part of our continuing mission to provide you with exceptional heart care, we have created designated Provider Care Teams.  These Care Teams include your primary Cardiologist (physician) and Advanced Practice Providers (APPs -  Physician Assistants and Nurse Practitioners) who all work together to provide you with the care you need, when you need it.  Your next appointment:   3 month(s)  The format for your next appointment:   In Person  Provider:    You may see Ida Rogue, MD or Christell Faith, PA-C.   Also, you will need to make an appt with Dr. Burt Knack for follow up post TAVR.

## 2020-01-06 ENCOUNTER — Other Ambulatory Visit: Payer: Self-pay

## 2020-01-06 ENCOUNTER — Encounter: Payer: Self-pay | Admitting: Urology

## 2020-01-06 ENCOUNTER — Ambulatory Visit: Payer: Medicare Other | Admitting: Urology

## 2020-01-06 VITALS — BP 119/72 | HR 103 | Ht 63.0 in

## 2020-01-06 DIAGNOSIS — N200 Calculus of kidney: Secondary | ICD-10-CM

## 2020-01-06 DIAGNOSIS — N3281 Overactive bladder: Secondary | ICD-10-CM | POA: Diagnosis not present

## 2020-01-06 DIAGNOSIS — Z87448 Personal history of other diseases of urinary system: Secondary | ICD-10-CM | POA: Diagnosis not present

## 2020-01-06 DIAGNOSIS — R300 Dysuria: Secondary | ICD-10-CM | POA: Diagnosis not present

## 2020-01-06 DIAGNOSIS — N952 Postmenopausal atrophic vaginitis: Secondary | ICD-10-CM

## 2020-01-06 LAB — BLADDER SCAN AMB NON-IMAGING: Scan Result: 16

## 2020-01-06 MED ORDER — MIRABEGRON ER 50 MG PO TB24: 50.0000 mg | ORAL_TABLET | Freq: Every day | ORAL | 0 refills | Status: DC

## 2020-01-06 NOTE — Progress Notes (Signed)
01/06/2020 11:02 AM   Catherine [name] 03-12-1941 [id number]  Referring provider: Danelle Berry, NP [address] [place],  [place] [address]  Chief Complaint  Patient presents with  . Recurrent UTI    HPI: Mrs. Catherine [name] is an 79 year old female with a history of hematuria and OAB who presents today for follow up with her daughter, Catherine [name].    History of hematuria (high risk) Non-smoker.  CTU 08/26/2018 revealed nonobstructing right renal calculus measures 3 mm.  Bosniak category 2 benign kidney lesion arises from the inferior pole of the right kidney.  Aortic Atherosclerosis.  Cystoscopy with Dr.  Bernardo Heater on 08/27/2018 was NED.  She has continued to have gross hematuria off and on.  Contrast CT 06/15/2019 revealed small hyperdense lesion off the lower pole of the right kidney, likely hemorrhagic cyst, stable since prior study. Punctate nephrolithiasis in the mid and lower poles of the right kidney. No ureteral stones or hydronephrosis. Adrenal glands and urinary bladder unremarkable.  Contrast CT 11/12/2019 Unchanged small exophytic cyst of the lower pole of the right kidney. Additional too small to characterize hypoattenuating lesion of the right upper pole. No hydronephrosis.  Ureters are normal in caliber. Bladder is unremarkable. Adrenals are unremarkable.  Contrast CT 11/19/2019 No diverticulitis or explanation for rectal bleeding. Moderate colonic stool burden with colonic tortuosity, can be seen with constipation.  Distended urinary bladder with mild bilateral hydronephrosis and hydroureter to the level of the bladder insertion. This may be secondary to bladder outlet obstruction, however recommend correlation with urinalysis to exclude urinary tract infection.  Aortic Atherosclerosis.  Contrast CT 12/12/2019 No acute abdominopelvic abnormality.  Nonobstructive right nephrolithiasis.  Aortic Atherosclerosis.   She does not endorse any gross hematuria.  UA today yellow cloudy, specific  gravity 1.020, pH 7.0, trace protein, trace leukocyte, 0-5 WBCs, 0-2 RBCs, greater than 10 epithelial cells, 0-10 renal epithelial cells, hyaline casts are present and few bacteria.  OAB Today, she is experiencing frequency, painful urination, nocturia and incontinence.  These are baseline symptoms.   She continues Myrbetriq 25 mg daily.  She states it is helpful with controlling her urinary symptoms, but she is wondering if she could have better control if we increased the dose.  Her PVR is 16 mL.    Right renal stones Contrast CT in 12/2019 noted two small non obstructing renal stones.     Today, she states that she is fearful that she may be having renal colic as she has a suprapubic burning and hot flashes.  Upon further questioning she states she has been having hot flashes on and on for several months and this is not a new complaint.  Patient denies any modifying or aggravating factors.  Patient denies any gross hematuria, dysuria or suprapubic/flank pain.  Patient denies any fevers, chills, nausea or vomiting.    PMH: Past Medical History:  Diagnosis Date  . Acute colitis   . Anemia   . CAD (coronary artery disease)    a. heavy calcification of the entire LAD & mod eccentric mid LAD stenosis, estimated @ 70%, mild nonobs stenosis of RCA and LCx, severely calcified Ao valve with restricted valve mobility and 2+ AI    . Cancer [facility]) 1981   breast  . CHB (complete heart block) ([place]) 08/31/2015   [place] DR model [id number] (serial number [id number] )   . Chronic diastolic congestive heart failure ([place])    a. echo 08/31/2015: EF 65-70%, nl WM, GR1DD,  Ao valve stent bioprosthesis was present and functioning nl, no regurg, LA mildly dilated, PASP 46 mm Hg  . Collagen vascular disease ([place])   . Colon polyps   . Depression   . Diabetes mellitus type II   . Fibromyalgia   . HTN (hypertension)   . Hypercholesteremia   . Hypothyroidism   . IBS (irritable bowel syndrome)   .  Rectal bleeding   . Rheumatoid arthritis(714.0)   . S/P TAVR (transcatheter aortic valve replacement) 08/30/2015   26 mm Edwards Sapien 3 transcatheter heart valve placed via open right transfemoral approach  . Shortness of breath dyspnea   . Stenosis of aortic valve   . Thyroid disease     Surgical History: Past Surgical History:  Procedure Laterality Date  . ABDOMINAL SURGERY     Intestinal surgery  . CARDIAC SURGERY    . CESAREAN SECTION     x 2  . EP IMPLANTABLE DEVICE N/A 08/31/2015   Procedure: Pacemaker Implant;  Surgeon: Will Meredith Leeds, MD; [place] (serial number [id number] ); Laterality: Right  . LEFT OOPHORECTOMY    . MASTECTOMY Left   . polyp     . self inflicted chest wound    . [place]   lower back  . TEE WITHOUT CARDIOVERSION N/A 08/30/2015   Procedure: TRANSESOPHAGEAL ECHOCARDIOGRAM (TEE);  Surgeon: Sherren Mocha, MD;  Location: [facility];  Service: Open Heart Surgery;  Laterality: N/A;  . TOTAL ABDOMINAL HYSTERECTOMY    . TRANSCATHETER AORTIC VALVE REPLACEMENT, TRANSFEMORAL N/A 08/30/2015   Procedure: TRANSCATHETER AORTIC VALVE REPLACEMENT, TRANSFEMORAL;  Surgeon: Sherren Mocha, MD;  Location: [facility];  Service: Open Heart Surgery;  Laterality: N/A;    Home Medications:  Allergies as of 01/06/2020      Reactions   Sulfa Antibiotics Hives   Latex Rash      Medication List       Accurate as of January 06, 2020 11:59 PM. If you have any questions, ask your nurse or doctor.        STOP taking these medications   atorvastatin 20 MG tablet Commonly known as: LIPITOR Stopped by: Zara Council, PA-C     TAKE these medications   aspirin EC 81 MG tablet Take 81 mg by mouth daily.   budesonide 0.5 MG/2ML nebulizer solution Commonly known as: PULMICORT Take 2 mLs (0.5 mg total) by nebulization 2 (two) times daily.   clonazePAM 0.5 MG tablet Commonly known as: KLONOPIN Take 1 tablet (0.5 mg total) by mouth at  bedtime.   furosemide 20 MG tablet Commonly known as: LASIX Take 1 tablet (20 mg total) by mouth every other day.   Lantus SoloStar 100 UNIT/ML Solostar Pen Generic drug: Insulin Glargine SMARTSIG:16 Unit(s) SUB-Q Every Night   levothyroxine 100 MCG tablet Commonly known as: SYNTHROID Take 100 mcg by mouth daily before breakfast.   losartan 50 MG tablet Commonly known as: COZAAR Take 1 tablet (50 mg total) by mouth as directed. Take HALF tablet (25 mg) for one week then increase to whole tablet (50 mg) if BP remains high.   mirabegron ER 50 MG Tb24 tablet Commonly known as: MYRBETRIQ Take 1 tablet (50 mg total) by mouth daily. What changed:   medication strength  how much to take Changed by: Kyrah Schiro, PA-C   nitroGLYCERIN 0.4 MG SL tablet Commonly known as: NITROSTAT PLACE 1 TABLET (0.4 MG TOTAL) UNDER THE TONGUE EVERY 5 (FIVE) MINUTES AS NEEDED FOR  CHEST PAIN.   ondansetron 4 MG disintegrating tablet Commonly known as: Zofran ODT Take 1 tablet (4 mg total) by mouth every 8 (eight) hours as needed.   PARoxetine 20 MG tablet Commonly known as: PAXIL Take 1 tablet (20 mg total) by mouth daily.   polyethylene glycol powder 17 GM/SCOOP powder Commonly known as: GLYCOLAX/MIRALAX Take 17 g by mouth daily as needed for moderate constipation.   potassium chloride 10 MEQ tablet Commonly known as: KLOR-CON TAKE 1 TABLET BY MOUTH EVERY DAY. TAKE AN EXTRA TABLET ON DAYS WHEN YOU TAKE AN EXTRA LASIX   traZODone 50 MG tablet Commonly known as: DESYREL Take 1/2 to one tab at bed time.       Allergies:  Allergies  Allergen Reactions  . Sulfa Antibiotics Hives  . Latex Rash    Family History: Family History  Problem Relation Age of Onset  . Depression Sister   . Depression Sister   . Depression Sister   . Hypertension Mother   . Lung cancer Sister   . Stroke Sister   . Hypertension Sister   . Heart attack Neg Hx     Social History:  reports that she  has never smoked. She has never used smokeless tobacco. She reports that she does not drink alcohol or use drugs.  ROS: UROLOGY Frequent Urination?: Yes Hard to postpone urination?: No Burning/pain with urination?: Yes Get up at night to urinate?: Yes Leakage of urine?: Yes Urine stream starts and stops?: No Trouble starting stream?: No Do you have to strain to urinate?: No Blood in urine?: No Urinary tract infection?: No Sexually transmitted disease?: No Injury to kidneys or bladder?: No Painful intercourse?: No Weak stream?: No Currently pregnant?: No Vaginal bleeding?: No Last menstrual period?: n  Gastrointestinal Nausea?: No Vomiting?: No Indigestion/heartburn?: No Diarrhea?: No Constipation?: No  Constitutional Fever: No Night sweats?: Yes Weight loss?: No Fatigue?: Yes  Skin Skin rash/lesions?: No Itching?: No  Eyes Blurred vision?: No Double vision?: No  Ears/Nose/Throat Sore throat?: No Sinus problems?: No  Hematologic/Lymphatic Swollen glands?: No Easy bruising?: No  Cardiovascular Leg swelling?: No Chest pain?: No  Respiratory Cough?: No Shortness of breath?: No  Endocrine Excessive thirst?: No  Musculoskeletal Back pain?: Yes Joint pain?: Yes  Neurological Headaches?: Yes Dizziness?: No  Psychologic Depression?: Yes Anxiety?: Yes  Physical Exam: BP 119/72   Pulse (!) 103   Ht 5\' 3"  (1.6 m)   BMI 33.30 kg/m   Constitutional:  Well nourished. Alert and oriented, No acute distress. HEENT: [place] AT, mask in place.  Trachea midline, no masses. Cardiovascular: No clubbing, cyanosis, or edema. Respiratory: Normal respiratory effort, no increased work of breathing. GI: Abdomen is soft, non tender, non distended, no abdominal masses.  GU: No CVA tenderness.  No bladder fullness or masses.  Neurologic: Grossly intact, no focal deficits, moving all 4 extremities. Psychiatric: Normal mood and affect.   Laboratory Data: Lab Results   Component Value Date   WBC 10.6 (H) 12/12/2019   HGB 12.5 12/12/2019   HCT 40.6 12/12/2019   MCV 83.9 12/12/2019   PLT 365 12/12/2019    Lab Results  Component Value Date   CREATININE 1.26 (H) 12/12/2019    Lab Results  Component Value Date   HGBA1C 6.6 (H) 03/10/2017    Lab Results  Component Value Date   TSH 7.993 (H) 03/10/2017       Component Value Date/Time   CHOL 186 03/10/2017 0424   HDL 43 03/10/2017 0424  CHOLHDL 4.3 03/10/2017 0424   VLDL 29 03/10/2017 0424   LDLCALC 114 (H) 03/10/2017 0424    Lab Results  Component Value Date   AST 32 12/12/2019   Lab Results  Component Value Date   ALT 15 12/12/2019    Urinalysis Component     Latest Ref Rng & Units 01/06/2020  Specific Gravity, UA     1.005 - 1.030 1.020  pH, UA     5.0 - 7.5 7.0  Color, UA     Yellow Yellow  Appearance Ur     Clear Cloudy (A)  Leukocytes,UA     Negative Trace (A)  Protein,UA     Negative/Trace Trace (A)  Glucose, UA     Negative Negative  Ketones, UA     Negative Negative  RBC, UA     Negative Negative  Bilirubin, UA     Negative Negative  Urobilinogen, Ur     0.2 - 1.0 mg/dL 0.2  Nitrite, UA     Negative Negative  Microscopic Examination      See below:   Component     Latest Ref Rng & Units 01/06/2020          WBC, UA     0 - 5 /hpf 0-5  RBC     0 - 2 /hpf 0-2  Epithelial Cells (non renal)     0 - 10 /hpf >10 (A)  Renal Epithel, UA     None seen /hpf 0-10 (A)  Casts     None seen /lpf Present (A)  Cast Type     N/A Hyaline casts  Bacteria, UA     None seen/Few Few  I have reviewed the labs.   Pertinent Imaging: Results for Catherine [name], Catherine [name] (MRN [id number]) as of 01/20/2020 09:11  Ref. Range 01/06/2020 15:05  Scan Result Unknown 16    Assessment & Plan:    1. History of hematuria Hematuria work up completed in 08/2018  - findings positive for punctate right renal stones and cysts  No report of gross hematuria  UA today negative for AMH    RTC in one year for UA - patient to report any gross hematuria in the interim    2. OAB Patient would like to have a trial of Myrbetriq 50 mg to see if she can achieve better control over her urinary incontinence I have given her Myrbetriq 50 mg, #28 samples She will return to clinic in 3 weeks time for OAB questionnaire and PVR  3. Right renal calculus Explained to the patient and her daughter that the right renal stones are small and not causing obstruction at this time, so no intervention warranted  We will continue to monitor with yearly KUBs Patient is advised that if they should start to experience pain that is not able to be controlled with pain medication, intractable nausea and/or vomiting and/or fevers greater than 103 or shaking chills to contact the office immediately or seek treatment in the emergency department for emergent intervention.    4.  Vaginal atrophy She is given a sample tube of Premarin cream instructed to apply it Monday, Wednesday and Friday nights.  Return in about 3 weeks (around 01/27/2020) for PVR and OAB questionnaire.  These notes generated with voice recognition software. I apologize for typographical errors.  Zara Council, PA-C  [facility] [address]  [place], [place] [address] [phone]

## 2020-01-07 LAB — MICROSCOPIC EXAMINATION: Epithelial Cells (non renal): >10 /hpf — AB (ref 0–10)

## 2020-01-07 LAB — URINALYSIS, COMPLETE
Bilirubin, UA: NEGATIVE
Glucose, UA: NEGATIVE
Ketones, UA: NEGATIVE
Nitrite, UA: NEGATIVE
RBC, UA: NEGATIVE
Specific Gravity, UA: 1.02 (ref 1.005–1.030)
Urobilinogen, Ur: 0.2 mg/dL (ref 0.2–1.0)
pH, UA: 7 (ref 5.0–7.5)

## 2020-01-14 ENCOUNTER — Ambulatory Visit (INDEPENDENT_AMBULATORY_CARE_PROVIDER_SITE_OTHER): Payer: Medicare Other | Admitting: *Deleted

## 2020-01-14 DIAGNOSIS — I442 Atrioventricular block, complete: Secondary | ICD-10-CM

## 2020-01-14 LAB — CUP PACEART REMOTE DEVICE CHECK
Battery Remaining Longevity: 116 mo
Battery Remaining Percentage: 95.5 %
Battery Voltage: 2.98 V
Brady Statistic AP VP Percent: 2.2 %
Brady Statistic AP VS Percent: 4.3 %
Brady Statistic AS VP Percent: 40 %
Brady Statistic AS VS Percent: 54 %
Brady Statistic RA Percent Paced: 6.3 %
Brady Statistic RV Percent Paced: 42 %
Date Time Interrogation Session: 20210211020014
Implantable Lead Implant Date: 20160928
Implantable Lead Implant Date: 20160928
Implantable Lead Location: 753859
Implantable Lead Location: 753860
Implantable Pulse Generator Implant Date: 20160928
Lead Channel Impedance Value: 380 Ohm
Lead Channel Impedance Value: 450 Ohm
Lead Channel Pacing Threshold Amplitude: 0.75 V
Lead Channel Pacing Threshold Amplitude: 0.75 V
Lead Channel Pacing Threshold Pulse Width: 0.4 ms
Lead Channel Pacing Threshold Pulse Width: 0.4 ms
Lead Channel Sensing Intrinsic Amplitude: 3.4 mV
Lead Channel Sensing Intrinsic Amplitude: 8.8 mV
Lead Channel Setting Pacing Amplitude: 2 V
Lead Channel Setting Pacing Amplitude: 2.5 V
Lead Channel Setting Pacing Pulse Width: 0.4 ms
Lead Channel Setting Sensing Sensitivity: 2.5 mV
Pulse Gen Model: 2240
Pulse Gen Serial Number: 7779295

## 2020-01-15 NOTE — Progress Notes (Signed)
PPM Remote  

## 2020-01-27 ENCOUNTER — Other Ambulatory Visit: Payer: Medicare Other

## 2020-01-28 ENCOUNTER — Ambulatory Visit: Payer: Self-pay | Admitting: Urology

## 2020-01-31 ENCOUNTER — Emergency Department (HOSPITAL_COMMUNITY): Payer: Medicare Other | Admitting: Certified Registered Nurse Anesthetist

## 2020-01-31 ENCOUNTER — Inpatient Hospital Stay (HOSPITAL_COMMUNITY)
Admission: EM | Admit: 2020-01-31 | Discharge: 2020-02-16 | DRG: 335 | Disposition: A | Discharge status: Other Acute Inpatient Hospital | Payer: Medicare Other | Attending: General Surgery | Admitting: General Surgery

## 2020-01-31 ENCOUNTER — Emergency Department (HOSPITAL_COMMUNITY): Payer: Medicare Other

## 2020-01-31 ENCOUNTER — Encounter (HOSPITAL_COMMUNITY): Payer: Self-pay | Admitting: Pharmacy Technician

## 2020-01-31 ENCOUNTER — Other Ambulatory Visit: Payer: Self-pay

## 2020-01-31 ENCOUNTER — Encounter (HOSPITAL_COMMUNITY)
Admission: EM | Disposition: A | Discharge status: Other Acute Inpatient Hospital | Payer: Self-pay | Source: Home / Self Care

## 2020-01-31 DIAGNOSIS — Z888 Allergy status to other drugs, medicaments and biological substances status: Secondary | ICD-10-CM

## 2020-01-31 DIAGNOSIS — Z794 Long term (current) use of insulin: Secondary | ICD-10-CM

## 2020-01-31 DIAGNOSIS — E669 Obesity, unspecified: Secondary | ICD-10-CM | POA: Diagnosis present

## 2020-01-31 DIAGNOSIS — Z7982 Long term (current) use of aspirin: Secondary | ICD-10-CM

## 2020-01-31 DIAGNOSIS — E039 Hypothyroidism, unspecified: Secondary | ICD-10-CM | POA: Diagnosis present

## 2020-01-31 DIAGNOSIS — N179 Acute kidney failure, unspecified: Secondary | ICD-10-CM | POA: Diagnosis present

## 2020-01-31 DIAGNOSIS — I110 Hypertensive heart disease with heart failure: Secondary | ICD-10-CM | POA: Diagnosis present

## 2020-01-31 DIAGNOSIS — I5032 Chronic diastolic (congestive) heart failure: Secondary | ICD-10-CM | POA: Diagnosis present

## 2020-01-31 DIAGNOSIS — R1084 Generalized abdominal pain: Secondary | ICD-10-CM

## 2020-01-31 DIAGNOSIS — K660 Peritoneal adhesions (postprocedural) (postinfection): Secondary | ICD-10-CM | POA: Diagnosis present

## 2020-01-31 DIAGNOSIS — X789XXA Intentional self-harm by unspecified sharp object, initial encounter: Secondary | ICD-10-CM | POA: Diagnosis present

## 2020-01-31 DIAGNOSIS — S31119A Laceration without foreign body of abdominal wall, unspecified quadrant without penetration into peritoneal cavity, initial encounter: Secondary | ICD-10-CM | POA: Diagnosis present

## 2020-01-31 DIAGNOSIS — Z882 Allergy status to sulfonamides status: Secondary | ICD-10-CM

## 2020-01-31 DIAGNOSIS — R109 Unspecified abdominal pain: Secondary | ICD-10-CM

## 2020-01-31 DIAGNOSIS — Z7951 Long term (current) use of inhaled steroids: Secondary | ICD-10-CM

## 2020-01-31 DIAGNOSIS — E785 Hyperlipidemia, unspecified: Secondary | ICD-10-CM | POA: Diagnosis present

## 2020-01-31 DIAGNOSIS — N3010 Interstitial cystitis (chronic) without hematuria: Secondary | ICD-10-CM | POA: Diagnosis present

## 2020-01-31 DIAGNOSIS — Z79890 Hormone replacement therapy (postmenopausal): Secondary | ICD-10-CM

## 2020-01-31 DIAGNOSIS — D62 Acute posthemorrhagic anemia: Secondary | ICD-10-CM | POA: Diagnosis present

## 2020-01-31 DIAGNOSIS — I2510 Atherosclerotic heart disease of native coronary artery without angina pectoris: Secondary | ICD-10-CM | POA: Diagnosis present

## 2020-01-31 DIAGNOSIS — Z950 Presence of cardiac pacemaker: Secondary | ICD-10-CM

## 2020-01-31 DIAGNOSIS — F332 Major depressive disorder, recurrent severe without psychotic features: Secondary | ICD-10-CM | POA: Diagnosis present

## 2020-01-31 DIAGNOSIS — T1491XA Suicide attempt, initial encounter: Secondary | ICD-10-CM | POA: Diagnosis present

## 2020-01-31 DIAGNOSIS — K567 Ileus, unspecified: Secondary | ICD-10-CM | POA: Diagnosis not present

## 2020-01-31 DIAGNOSIS — S31619A Laceration without foreign body of abdominal wall, unspecified quadrant with penetration into peritoneal cavity, initial encounter: Principal | ICD-10-CM | POA: Diagnosis present

## 2020-01-31 DIAGNOSIS — R413 Other amnesia: Secondary | ICD-10-CM | POA: Diagnosis present

## 2020-01-31 DIAGNOSIS — Z952 Presence of prosthetic heart valve: Secondary | ICD-10-CM

## 2020-01-31 DIAGNOSIS — Z6832 Body mass index (BMI) 32.0-32.9, adult: Secondary | ICD-10-CM

## 2020-01-31 DIAGNOSIS — E876 Hypokalemia: Secondary | ICD-10-CM | POA: Diagnosis not present

## 2020-01-31 DIAGNOSIS — B952 Enterococcus as the cause of diseases classified elsewhere: Secondary | ICD-10-CM | POA: Diagnosis present

## 2020-01-31 DIAGNOSIS — I959 Hypotension, unspecified: Secondary | ICD-10-CM | POA: Diagnosis not present

## 2020-01-31 DIAGNOSIS — U071 COVID-19: Secondary | ICD-10-CM | POA: Diagnosis present

## 2020-01-31 DIAGNOSIS — M797 Fibromyalgia: Secondary | ICD-10-CM | POA: Diagnosis present

## 2020-01-31 DIAGNOSIS — S61519A Laceration without foreign body of unspecified wrist, initial encounter: Secondary | ICD-10-CM | POA: Diagnosis present

## 2020-01-31 DIAGNOSIS — Z79899 Other long term (current) drug therapy: Secondary | ICD-10-CM

## 2020-01-31 DIAGNOSIS — K661 Hemoperitoneum: Secondary | ICD-10-CM | POA: Diagnosis present

## 2020-01-31 DIAGNOSIS — E162 Hypoglycemia, unspecified: Secondary | ICD-10-CM | POA: Diagnosis not present

## 2020-01-31 DIAGNOSIS — R58 Hemorrhage, not elsewhere classified: Secondary | ICD-10-CM | POA: Diagnosis present

## 2020-01-31 DIAGNOSIS — Z598 Other problems related to housing and economic circumstances: Secondary | ICD-10-CM

## 2020-01-31 DIAGNOSIS — B965 Pseudomonas (aeruginosa) (mallei) (pseudomallei) as the cause of diseases classified elsewhere: Secondary | ICD-10-CM | POA: Diagnosis present

## 2020-01-31 DIAGNOSIS — Z23 Encounter for immunization: Secondary | ICD-10-CM | POA: Diagnosis not present

## 2020-01-31 DIAGNOSIS — Z91040 Latex allergy status: Secondary | ICD-10-CM

## 2020-01-31 DIAGNOSIS — Z915 Personal history of self-harm: Secondary | ICD-10-CM

## 2020-01-31 HISTORY — DX: Suicide attempt, initial encounter: T14.91XA

## 2020-01-31 HISTORY — PX: LAPAROSCOPY: SHX197

## 2020-01-31 HISTORY — PX: LAPAROTOMY: SHX154

## 2020-01-31 HISTORY — PX: LYSIS OF ADHESION: SHX5961

## 2020-01-31 LAB — CBC
HCT: 43.3 % (ref 36.0–46.0)
Hemoglobin: 13.4 g/dL (ref 12.0–15.0)
MCH: 26.6 pg (ref 26.0–34.0)
MCHC: 30.9 g/dL (ref 30.0–36.0)
MCV: 86.1 fL (ref 80.0–100.0)
Platelets: 412 10*3/uL — ABNORMAL HIGH (ref 150–400)
RBC: 5.03 MIL/uL (ref 3.87–5.11)
RDW: 15.8 % — ABNORMAL HIGH (ref 11.5–15.5)
WBC: 12.9 10*3/uL — ABNORMAL HIGH (ref 4.0–10.5)
nRBC: 0 % (ref 0.0–0.2)

## 2020-01-31 LAB — I-STAT CHEM 8, ED
BUN: 21 mg/dL (ref 8–23)
Calcium, Ion: 1.09 mmol/L — ABNORMAL LOW (ref 1.15–1.40)
Chloride: 101 mmol/L (ref 98–111)
Creatinine, Ser: 1.2 mg/dL — ABNORMAL HIGH (ref 0.44–1.00)
Glucose, Bld: 214 mg/dL — ABNORMAL HIGH (ref 70–99)
HCT: 40 % (ref 36.0–46.0)
Hemoglobin: 13.6 g/dL (ref 12.0–15.0)
Potassium: 4.6 mmol/L (ref 3.5–5.1)
Sodium: 135 mmol/L (ref 135–145)
TCO2: 30 mmol/L (ref 22–32)

## 2020-01-31 LAB — PREPARE RBC (CROSSMATCH)

## 2020-01-31 LAB — POCT I-STAT 7, (LYTES, BLD GAS, ICA,H+H)
Acid-Base Excess: 4 mmol/L — ABNORMAL HIGH (ref 0.0–2.0)
Acid-base deficit: 2 mmol/L (ref 0.0–2.0)
Bicarbonate: 28.6 mmol/L — ABNORMAL HIGH (ref 20.0–28.0)
Bicarbonate: 23.4 mmol/L (ref 20.0–28.0)
Calcium, Ion: 1.18 mmol/L (ref 1.15–1.40)
Calcium, Ion: 1.09 mmol/L — ABNORMAL LOW (ref 1.15–1.40)
HCT: 28 % — ABNORMAL LOW (ref 36.0–46.0)
HCT: 34 % — ABNORMAL LOW (ref 36.0–46.0)
Hemoglobin: 9.5 g/dL — ABNORMAL LOW (ref 12.0–15.0)
Hemoglobin: 11.6 g/dL — ABNORMAL LOW (ref 12.0–15.0)
O2 Saturation: 100 %
O2 Saturation: 100 %
Potassium: 3.9 mmol/L (ref 3.5–5.1)
Potassium: 4.2 mmol/L (ref 3.5–5.1)
Sodium: 138 mmol/L (ref 135–145)
Sodium: 138 mmol/L (ref 135–145)
TCO2: 30 mmol/L (ref 22–32)
TCO2: 25 mmol/L (ref 22–32)
pCO2 arterial: 43.5 mmHg (ref 32.0–48.0)
pCO2 arterial: 41.7 mmHg (ref 32.0–48.0)
pH, Arterial: 7.426 (ref 7.350–7.450)
pH, Arterial: 7.358 (ref 7.350–7.450)
pO2, Arterial: 572 mmHg — ABNORMAL HIGH (ref 83.0–108.0)
pO2, Arterial: 381 mmHg — ABNORMAL HIGH (ref 83.0–108.0)

## 2020-01-31 LAB — COMPREHENSIVE METABOLIC PANEL
ALT: 17 U/L (ref 0–44)
AST: 40 U/L (ref 15–41)
Albumin: 3.9 g/dL (ref 3.5–5.0)
Alkaline Phosphatase: 51 U/L (ref 38–126)
Anion gap: 12 (ref 5–15)
BUN: 17 mg/dL (ref 8–23)
CO2: 24 mmol/L (ref 22–32)
Calcium: 9.7 mg/dL (ref 8.9–10.3)
Chloride: 101 mmol/L (ref 98–111)
Creatinine, Ser: 1.31 mg/dL — ABNORMAL HIGH (ref 0.44–1.00)
GFR calc Af Amer: 44 mL/min — ABNORMAL LOW (ref >60)
GFR calc non Af Amer: 38 mL/min — ABNORMAL LOW (ref >60)
Glucose, Bld: 216 mg/dL — ABNORMAL HIGH (ref 70–99)
Potassium: 4.7 mmol/L (ref 3.5–5.1)
Sodium: 137 mmol/L (ref 135–145)
Total Bilirubin: 0.9 mg/dL (ref 0.3–1.2)
Total Protein: 6.8 g/dL (ref 6.5–8.1)

## 2020-01-31 LAB — GLUCOSE, CAPILLARY: Glucose-Capillary: 220 mg/dL — ABNORMAL HIGH (ref 70–99)

## 2020-01-31 LAB — ABO/RH: ABO/RH(D): A POS

## 2020-01-31 LAB — PROTIME-INR
INR: 1 (ref 0.8–1.2)
Prothrombin Time: 13.4 seconds (ref 11.4–15.2)

## 2020-01-31 LAB — RESPIRATORY PANEL BY RT PCR (FLU A&B, COVID)
Influenza A by PCR: NEGATIVE
Influenza B by PCR: NEGATIVE
SARS Coronavirus 2 by RT PCR: POSITIVE — AB

## 2020-01-31 LAB — CDS SEROLOGY

## 2020-01-31 LAB — LACTIC ACID, PLASMA: Lactic Acid, Venous: 2.9 mmol/L (ref 0.5–1.9)

## 2020-01-31 LAB — ETHANOL: Alcohol, Ethyl (B): <10 mg/dL (ref <10)

## 2020-01-31 LAB — HEMOGLOBIN A1C
Hgb A1c MFr Bld: 6.9 % — ABNORMAL HIGH (ref 4.8–5.6)
Mean Plasma Glucose: 151.33 mg/dL

## 2020-01-31 LAB — SAMPLE TO BLOOD BANK

## 2020-01-31 SURGERY — LAPAROSCOPY, DIAGNOSTIC
Anesthesia: General | Site: Abdomen

## 2020-01-31 MED ORDER — DEXAMETHASONE SODIUM PHOSPHATE 10 MG/ML IJ SOLN
INTRAMUSCULAR | Status: DC | PRN
  Administered 2020-01-31: 5 mg via INTRAVENOUS

## 2020-01-31 MED ORDER — FUROSEMIDE 20 MG PO TABS
20.0000 mg | ORAL_TABLET | ORAL | Status: DC
  Administered 2020-02-01 – 2020-02-15 (×8): 20 mg via ORAL
  Filled 2020-01-31 (×12): qty 1

## 2020-01-31 MED ORDER — CEFAZOLIN SODIUM 1 G IJ SOLR
INTRAMUSCULAR | Status: AC
  Filled 2020-01-31: qty 20

## 2020-01-31 MED ORDER — HYDROMORPHONE HCL 1 MG/ML IJ SOLN
1.0000 mg | Freq: Once | INTRAMUSCULAR | Status: AC
  Administered 2020-01-31: 1 mg via INTRAVENOUS
  Filled 2020-01-31: qty 1

## 2020-01-31 MED ORDER — FENTANYL CITRATE (PF) 250 MCG/5ML IJ SOLN
INTRAMUSCULAR | Status: AC
  Filled 2020-01-31: qty 5

## 2020-01-31 MED ORDER — EPHEDRINE 5 MG/ML INJ
INTRAVENOUS | Status: AC
  Filled 2020-01-31: qty 10

## 2020-01-31 MED ORDER — PANTOPRAZOLE SODIUM 40 MG PO TBEC
40.0000 mg | DELAYED_RELEASE_TABLET | Freq: Every day | ORAL | Status: DC
  Administered 2020-01-31 – 2020-02-16 (×17): 40 mg via ORAL
  Filled 2020-01-31 (×17): qty 1

## 2020-01-31 MED ORDER — POTASSIUM CHLORIDE ER 10 MEQ PO TBCR
10.0000 meq | EXTENDED_RELEASE_TABLET | ORAL | Status: DC
  Administered 2020-02-01: 08:00:00 10 meq via ORAL
  Filled 2020-01-31 (×2): qty 1

## 2020-01-31 MED ORDER — SUCCINYLCHOLINE CHLORIDE 200 MG/10ML IV SOSY
PREFILLED_SYRINGE | INTRAVENOUS | Status: DC | PRN
  Administered 2020-01-31: 120 mg via INTRAVENOUS

## 2020-01-31 MED ORDER — ROCURONIUM BROMIDE 100 MG/10ML IV SOLN: INTRAVENOUS | Status: DC | PRN

## 2020-01-31 MED ORDER — SODIUM CHLORIDE 0.9% IV SOLUTION: Freq: Once | INTRAVENOUS | Status: DC

## 2020-01-31 MED ORDER — ALBUMIN HUMAN 5 % IV SOLN: INTRAVENOUS | Status: DC | PRN

## 2020-01-31 MED ORDER — DEXAMETHASONE SODIUM PHOSPHATE 10 MG/ML IJ SOLN
INTRAMUSCULAR | Status: AC
  Filled 2020-01-31: qty 1

## 2020-01-31 MED ORDER — MIDAZOLAM HCL 2 MG/2ML IJ SOLN
INTRAMUSCULAR | Status: AC
  Filled 2020-01-31: qty 2

## 2020-01-31 MED ORDER — LIDOCAINE 2% (20 MG/ML) 5 ML SYRINGE
INTRAMUSCULAR | Status: DC | PRN
  Administered 2020-01-31: 100 mg via INTRAVENOUS

## 2020-01-31 MED ORDER — SODIUM CHLORIDE 0.9 % IV SOLN: INTRAVENOUS | Status: DC | PRN

## 2020-01-31 MED ORDER — POLYETHYLENE GLYCOL 3350 17 G PO PACK: 17.0000 g | PACK | Freq: Every day | ORAL | Status: DC | PRN

## 2020-01-31 MED ORDER — NITROGLYCERIN 0.4 MG SL SUBL
0.4000 mg | SUBLINGUAL_TABLET | SUBLINGUAL | Status: DC | PRN
  Administered 2020-02-09 (×2): 0.4 mg via SUBLINGUAL
  Filled 2020-01-31: qty 1

## 2020-01-31 MED ORDER — SODIUM CHLORIDE 0.9 % IV SOLN
INTRAVENOUS | Status: AC | PRN
  Administered 2020-01-31: 1000 mL via INTRAVENOUS

## 2020-01-31 MED ORDER — SODIUM CHLORIDE 0.9 % IV SOLN
INTRAVENOUS | Status: DC
  Administered 2020-02-01: 50 mL/h via INTRAVENOUS

## 2020-01-31 MED ORDER — 0.9 % SODIUM CHLORIDE (POUR BTL) OPTIME
TOPICAL | Status: DC | PRN
  Administered 2020-01-31 (×6): 1000 mL

## 2020-01-31 MED ORDER — PROPOFOL 10 MG/ML IV BOLUS
INTRAVENOUS | Status: DC | PRN
  Administered 2020-01-31: 120 mg via INTRAVENOUS

## 2020-01-31 MED ORDER — SODIUM CHLORIDE 0.9 % IV SOLN
2.0000 g | Freq: Four times a day (QID) | INTRAVENOUS | Status: DC
  Filled 2020-01-31 (×2): qty 2

## 2020-01-31 MED ORDER — DOCUSATE SODIUM 100 MG PO CAPS
100.0000 mg | ORAL_CAPSULE | Freq: Two times a day (BID) | ORAL | Status: DC
  Administered 2020-01-31 – 2020-02-16 (×24): 100 mg via ORAL
  Filled 2020-01-31 (×29): qty 1

## 2020-01-31 MED ORDER — LOSARTAN POTASSIUM 50 MG PO TABS
50.0000 mg | ORAL_TABLET | Freq: Every day | ORAL | Status: DC
  Administered 2020-01-31 – 2020-02-16 (×17): 50 mg via ORAL
  Filled 2020-01-31 (×17): qty 1

## 2020-01-31 MED ORDER — ENOXAPARIN SODIUM 40 MG/0.4ML ~~LOC~~ SOLN
40.0000 mg | SUBCUTANEOUS | Status: DC
  Administered 2020-02-01 – 2020-02-16 (×16): 40 mg via SUBCUTANEOUS
  Filled 2020-01-31 (×16): qty 0.4

## 2020-01-31 MED ORDER — LACTATED RINGERS IV SOLN: INTRAVENOUS | Status: DC | PRN

## 2020-01-31 MED ORDER — FENTANYL CITRATE (PF) 100 MCG/2ML IJ SOLN
25.0000 ug | INTRAMUSCULAR | Status: DC | PRN
  Administered 2020-01-31 (×2): 50 ug via INTRAVENOUS

## 2020-01-31 MED ORDER — BUDESONIDE 0.5 MG/2ML IN SUSP: 2.0000 mL | Freq: Two times a day (BID) | RESPIRATORY_TRACT | Status: DC

## 2020-01-31 MED ORDER — TRAZODONE HCL 50 MG PO TABS
50.0000 mg | ORAL_TABLET | Freq: Every day | ORAL | Status: DC
  Administered 2020-01-31 – 2020-02-15 (×16): 50 mg via ORAL
  Filled 2020-01-31 (×16): qty 1

## 2020-01-31 MED ORDER — SUGAMMADEX SODIUM 200 MG/2ML IV SOLN
INTRAVENOUS | Status: DC | PRN
  Administered 2020-01-31: 400 mg via INTRAVENOUS
  Administered 2020-01-31: 100 mg via INTRAVENOUS

## 2020-01-31 MED ORDER — SUCCINYLCHOLINE CHLORIDE 200 MG/10ML IV SOSY
PREFILLED_SYRINGE | INTRAVENOUS | Status: AC
  Filled 2020-01-31: qty 10

## 2020-01-31 MED ORDER — ONDANSETRON HCL 4 MG/2ML IJ SOLN
INTRAMUSCULAR | Status: AC
  Filled 2020-01-31: qty 2

## 2020-01-31 MED ORDER — PAROXETINE HCL 20 MG PO TABS
20.0000 mg | ORAL_TABLET | Freq: Every day | ORAL | Status: DC
  Administered 2020-01-31 – 2020-02-13 (×14): 20 mg via ORAL
  Filled 2020-01-31 (×14): qty 1

## 2020-01-31 MED ORDER — BUDESONIDE 180 MCG/ACT IN AEPB
1.0000 | INHALATION_SPRAY | Freq: Two times a day (BID) | RESPIRATORY_TRACT | Status: DC
  Administered 2020-01-31 – 2020-02-16 (×29): 1 via RESPIRATORY_TRACT
  Filled 2020-01-31 (×3): qty 1

## 2020-01-31 MED ORDER — PHENYLEPHRINE 40 MCG/ML (10ML) SYRINGE FOR IV PUSH (FOR BLOOD PRESSURE SUPPORT)
PREFILLED_SYRINGE | INTRAVENOUS | Status: DC | PRN
  Administered 2020-01-31: 120 ug via INTRAVENOUS
  Administered 2020-01-31: 160 ug via INTRAVENOUS
  Administered 2020-01-31 (×2): 120 ug via INTRAVENOUS
  Administered 2020-01-31: 160 ug via INTRAVENOUS

## 2020-01-31 MED ORDER — SODIUM CHLORIDE 0.9 % IV SOLN
2.0000 g | Freq: Four times a day (QID) | INTRAVENOUS | Status: AC
  Administered 2020-01-31: 11:00:00 2 g via INTRAVENOUS
  Filled 2020-01-31: qty 2

## 2020-01-31 MED ORDER — OXYCODONE HCL 5 MG PO TABS
5.0000 mg | ORAL_TABLET | ORAL | Status: DC | PRN
  Administered 2020-01-31 – 2020-02-01 (×2): 5 mg via ORAL
  Filled 2020-01-31 (×2): qty 1

## 2020-01-31 MED ORDER — ATORVASTATIN CALCIUM 10 MG PO TABS
20.0000 mg | ORAL_TABLET | Freq: Every day | ORAL | Status: DC
  Administered 2020-01-31 – 2020-02-16 (×17): 20 mg via ORAL
  Filled 2020-01-31 (×17): qty 2

## 2020-01-31 MED ORDER — FENTANYL CITRATE (PF) 100 MCG/2ML IJ SOLN
INTRAMUSCULAR | Status: AC
  Filled 2020-01-31: qty 2

## 2020-01-31 MED ORDER — MIDAZOLAM HCL 5 MG/5ML IJ SOLN
INTRAMUSCULAR | Status: DC | PRN
  Administered 2020-01-31: 2 mg via INTRAVENOUS

## 2020-01-31 MED ORDER — LIDOCAINE 2% (20 MG/ML) 5 ML SYRINGE
INTRAMUSCULAR | Status: AC
  Filled 2020-01-31: qty 5

## 2020-01-31 MED ORDER — ACETAMINOPHEN 325 MG PO TABS
650.0000 mg | ORAL_TABLET | ORAL | Status: DC | PRN
  Administered 2020-01-31: 650 mg via ORAL
  Filled 2020-01-31: qty 2

## 2020-01-31 MED ORDER — INSULIN GLARGINE 100 UNIT/ML ~~LOC~~ SOLN
16.0000 [IU] | Freq: Every day | SUBCUTANEOUS | Status: DC
  Administered 2020-01-31 – 2020-02-05 (×6): 16 [IU] via SUBCUTANEOUS
  Filled 2020-01-31 (×7): qty 0.16

## 2020-01-31 MED ORDER — ONDANSETRON HCL 4 MG/2ML IJ SOLN: 4.0000 mg | Freq: Four times a day (QID) | INTRAMUSCULAR | Status: DC | PRN

## 2020-01-31 MED ORDER — ROCURONIUM BROMIDE 10 MG/ML (PF) SYRINGE
PREFILLED_SYRINGE | INTRAVENOUS | Status: DC | PRN
  Administered 2020-01-31: 70 mg via INTRAVENOUS
  Administered 2020-01-31: 30 mg via INTRAVENOUS

## 2020-01-31 MED ORDER — ROCURONIUM BROMIDE 10 MG/ML (PF) SYRINGE
PREFILLED_SYRINGE | INTRAVENOUS | Status: AC
  Filled 2020-01-31: qty 30

## 2020-01-31 MED ORDER — PHENYLEPHRINE 40 MCG/ML (10ML) SYRINGE FOR IV PUSH (FOR BLOOD PRESSURE SUPPORT)
PREFILLED_SYRINGE | INTRAVENOUS | Status: AC
  Filled 2020-01-31: qty 20

## 2020-01-31 MED ORDER — ONDANSETRON HCL 4 MG/2ML IJ SOLN
INTRAMUSCULAR | Status: DC | PRN
  Administered 2020-01-31: 4 mg via INTRAVENOUS

## 2020-01-31 MED ORDER — LEVOTHYROXINE SODIUM 100 MCG PO TABS
100.0000 ug | ORAL_TABLET | Freq: Every day | ORAL | Status: DC
  Administered 2020-02-01 – 2020-02-16 (×16): 100 ug via ORAL
  Filled 2020-01-31 (×16): qty 1

## 2020-01-31 MED ORDER — EPHEDRINE SULFATE-NACL 50-0.9 MG/10ML-% IV SOSY
PREFILLED_SYRINGE | INTRAVENOUS | Status: DC | PRN
  Administered 2020-01-31: 10 mg via INTRAVENOUS
  Administered 2020-01-31: 5 mg via INTRAVENOUS

## 2020-01-31 MED ORDER — TETANUS-DIPHTH-ACELL PERTUSSIS 5-2.5-18.5 LF-MCG/0.5 IM SUSP
0.5000 mL | Freq: Once | INTRAMUSCULAR | Status: AC
  Administered 2020-01-31: 10:00:00 0.5 mL via INTRAMUSCULAR

## 2020-01-31 MED ORDER — FENTANYL CITRATE (PF) 250 MCG/5ML IJ SOLN
INTRAMUSCULAR | Status: DC | PRN
  Administered 2020-01-31 (×4): 25 ug via INTRAVENOUS
  Administered 2020-01-31: 100 ug via INTRAVENOUS
  Administered 2020-01-31 (×2): 25 ug via INTRAVENOUS

## 2020-01-31 MED ORDER — METOPROLOL TARTRATE 5 MG/5ML IV SOLN
5.0000 mg | Freq: Four times a day (QID) | INTRAVENOUS | Status: DC | PRN
  Administered 2020-01-31: 5 mg via INTRAVENOUS
  Filled 2020-01-31: qty 5

## 2020-01-31 MED ORDER — ONDANSETRON 4 MG PO TBDP: 4.0000 mg | ORAL_TABLET | Freq: Four times a day (QID) | ORAL | Status: DC | PRN

## 2020-01-31 MED ORDER — CLONAZEPAM 0.5 MG PO TABS
0.5000 mg | ORAL_TABLET | Freq: Every day | ORAL | Status: DC
  Administered 2020-01-31 – 2020-02-15 (×16): 0.5 mg via ORAL
  Filled 2020-01-31 (×16): qty 1

## 2020-01-31 MED ORDER — MORPHINE SULFATE (PF) 2 MG/ML IV SOLN
2.0000 mg | INTRAVENOUS | Status: DC | PRN
  Administered 2020-01-31: 2 mg via INTRAVENOUS
  Administered 2020-02-01: 4 mg via INTRAVENOUS
  Filled 2020-01-31: qty 1
  Filled 2020-01-31: qty 2

## 2020-01-31 MED ORDER — PHENYLEPHRINE HCL-NACL 10-0.9 MG/250ML-% IV SOLN
INTRAVENOUS | Status: DC | PRN
  Administered 2020-01-31: 100 ug/min via INTRAVENOUS

## 2020-01-31 SURGICAL SUPPLY — 47 items
APL PRP STRL LF DISP 70% ISPRP (MISCELLANEOUS)
CHLORAPREP W/TINT 26 (MISCELLANEOUS) ×2 IMPLANT
CLIP VESOCCLUDE LG 6/CT (CLIP) ×2 IMPLANT
CLIP VESOCCLUDE MED 6/CT (CLIP) ×2 IMPLANT
CLIP VESOCCLUDE SM WIDE 6/CT (CLIP) ×2 IMPLANT
COVER SURGICAL LIGHT HANDLE (MISCELLANEOUS) ×4 IMPLANT
COVER WAND RF STERILE (DRAPES) ×2 IMPLANT
DRAPE LAPAROSCOPIC ABDOMINAL (DRAPES) ×4 IMPLANT
DRAPE WARM FLUID 44X44 (DRAPES) ×4 IMPLANT
DRSG OPSITE POSTOP 3X4 (GAUZE/BANDAGES/DRESSINGS) ×2 IMPLANT
DRSG OPSITE POSTOP 4X10 (GAUZE/BANDAGES/DRESSINGS) IMPLANT
DRSG OPSITE POSTOP 4X12 (GAUZE/BANDAGES/DRESSINGS) ×2 IMPLANT
DRSG OPSITE POSTOP 4X8 (GAUZE/BANDAGES/DRESSINGS) IMPLANT
ELECT BLADE 6.5 EXT (BLADE) ×2 IMPLANT
ELECT CAUTERY BLADE 6.4 (BLADE) ×4 IMPLANT
ELECT REM PT RETURN 9FT ADLT (ELECTROSURGICAL) ×4
ELECTRODE REM PT RTRN 9FT ADLT (ELECTROSURGICAL) ×2 IMPLANT
GLOVE BIOGEL PI IND STRL 7.0 (GLOVE) ×2 IMPLANT
GLOVE BIOGEL PI INDICATOR 7.0 (GLOVE) ×2
GLOVE SURG SS PI 7.0 STRL IVOR (GLOVE) ×4 IMPLANT
GOWN STRL REUS W/ TWL LRG LVL3 (GOWN DISPOSABLE) ×4 IMPLANT
GOWN STRL REUS W/TWL LRG LVL3 (GOWN DISPOSABLE) ×8
HANDLE SUCTION POOLE (INSTRUMENTS) ×2 IMPLANT
KIT BASIN OR (CUSTOM PROCEDURE TRAY) ×4 IMPLANT
LIGASURE IMPACT 36 18CM CVD LR (INSTRUMENTS) ×2 IMPLANT
LOOP VESSEL MAXI BLUE (MISCELLANEOUS) IMPLANT
LOOP VESSEL MINI RED (MISCELLANEOUS) IMPLANT
NEEDLE 22X1 1/2 (OR ONLY) (NEEDLE) ×2 IMPLANT
PACK GENERAL/GYN (CUSTOM PROCEDURE TRAY) ×4 IMPLANT
PENCIL SMOKE EVACUATOR (MISCELLANEOUS) ×4 IMPLANT
SLEEVE ENDOPATH XCEL 5M (ENDOMECHANICALS) ×2 IMPLANT
SLEEVE SUCTION 125 (MISCELLANEOUS) ×2 IMPLANT
SPONGE LAP 18X18 RF (DISPOSABLE) ×6 IMPLANT
STAPLER VISISTAT 35W (STAPLE) ×4 IMPLANT
SUCTION POOLE HANDLE (INSTRUMENTS) ×4
SUT PDS AB 0 CTX 36 PDP370T (SUTURE) ×4 IMPLANT
SUT PDS AB 1 TP1 96 (SUTURE) IMPLANT
SUT SILK 2 0 (SUTURE) ×4
SUT SILK 2 0 SH CR/8 (SUTURE) ×4 IMPLANT
SUT SILK 2-0 18XBRD TIE 12 (SUTURE) ×2 IMPLANT
SUT SILK 3 0 (SUTURE) ×4
SUT SILK 3 0 SH CR/8 (SUTURE) ×4 IMPLANT
SUT SILK 3-0 18XBRD TIE 12 (SUTURE) ×2 IMPLANT
TOWEL GREEN STERILE (TOWEL DISPOSABLE) ×6 IMPLANT
TRAY FOLEY MTR SLVR 16FR STAT (SET/KITS/TRAYS/PACK) ×4 IMPLANT
TROCAR XCEL NON-BLD 5MMX100MML (ENDOMECHANICALS) ×2 IMPLANT
YANKAUER SUCT BULB TIP NO VENT (SUCTIONS) ×2 IMPLANT

## 2020-01-31 NOTE — Progress Notes (Signed)
HARMONII MUSTOE [id number]  Code Status: FULL  Admission Data: 01/31/2020 5:49 PM  Catherine [name] is a 79 y.o. female patient admitted from PACU awake, alert - oriented X 4. VSS - Blood pressure 134/70, pulse 79, temperature (!) 97.2 F (36.2 C), temperature source Oral, resp. rate (!) 25, height 5\' 3"  (1.6 m), weight 83.9 kg, SpO2 95 %. no c/o shortness of breath, no c/o chest pain. Orientation to room, and floor completed with information packet given to patient/family. Admission INP armband ID verified with patient/family, and in place. Call light within reach, patient able to voice, and demonstrate understanding. Skin, clean-dry. without evidence of bruising, or skin tears. Honeycomb dressing to mid abdomen intact with old drainage. No evidence of skin break down noted on exam.  Suicide sitter at bedside. ?  Will cont to eval and treat per MD orders.  Melonie Florida, RN
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
## 2020-01-31 NOTE — Anesthesia Postprocedure Evaluation (Signed)
Anesthesia Post Note  Patient: Catherine [name]  Procedure(s) Performed: LAPAROSCOPY DIAGNOSTIC (N/A Abdomen) Exploratory Laparotomy (N/A Abdomen) Lysis Of Adhesions, Ligation of Falciform Vein (Abdomen)     Patient location during evaluation: PACU Anesthesia Type: General Level of consciousness: awake Pain management: pain level controlled Vital Signs Assessment: post-procedure vital signs reviewed and stable Cardiovascular status: stable Postop Assessment: no apparent nausea or vomiting Anesthetic complications: no    Last Vitals:  Vitals:   01/31/20 1600 01/31/20 1900  BP: 134/70 134/70  Pulse: 79   Resp: (!) 25 16  Temp:  36.5 C  SpO2: 95%     Last Pain:  Vitals:   01/31/20 1900  TempSrc: Oral  PainSc:                  [facility]

## 2020-01-31 NOTE — Anesthesia Preprocedure Evaluation (Signed)
Anesthesia Evaluation  Patient identified by MRN, date of birth, ID band Patient awake    Airway Mallampati: II  TM Distance: >3 FB     Dental   Pulmonary    breath sounds clear to auscultation       Cardiovascular negative cardio ROS   Rhythm:Regular     Neuro/Psych    GI/Hepatic Neg liver ROS, History noted. CG   Endo/Other  negative endocrine ROS  Renal/GU negative Renal ROS     Musculoskeletal   Abdominal   Peds  Hematology   Anesthesia Other Findings   Reproductive/Obstetrics                             Anesthesia Physical Anesthesia Plan  ASA: III  Anesthesia Plan: General   Post-op Pain Management:    Induction: Intravenous  PONV Risk Score and Plan: 3 and Ondansetron and Midazolam  Airway Management Planned: Oral ETT  Additional Equipment:   Intra-op Plan:   Post-operative Plan: Extubation in OR  Informed Consent: I have reviewed the patients History and Physical, chart, labs and discussed the procedure including the risks, benefits and alternatives for the proposed anesthesia with the patient or authorized representative who has indicated his/her understanding and acceptance.     Dental advisory given  Plan Discussed with: CRNA and Anesthesiologist  Anesthesia Plan Comments:         Anesthesia Quick Evaluation

## 2020-01-31 NOTE — Transfer of Care (Signed)
Immediate Anesthesia Transfer of Care Note  Patient: Catherine [name]  Procedure(s) Performed: LAPAROSCOPY DIAGNOSTIC (N/A Abdomen) Exploratory Laparotomy (N/A Abdomen) Lysis Of Adhesions, Ligation of Falciform Vein (Abdomen)  Patient Location: Recovery in OR 1  Anesthesia Type:General  Level of Consciousness: awake  Airway & Oxygen Therapy: Patient Spontanous Breathing and Patient connected to face mask oxygen  Post-op Assessment: Report given to RN and Post -op Vital signs reviewed and stable  Post vital signs: Reviewed  Last Vitals:  Vitals Value Taken Time  BP    Temp    Pulse    Resp    SpO2      Last Pain:  Vitals:   01/31/20 1034  TempSrc:   PainSc: 6          Complications: No apparent anesthesia complications
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
## 2020-01-31 NOTE — Anesthesia Procedure Notes (Signed)
Procedure Name: Intubation Date/Time: 01/31/2020 10:52 AM Performed by: Janene Harvey, CRNA Pre-anesthesia Checklist: Patient identified, Emergency Drugs available, Suction available and Patient being monitored Patient Re-evaluated:Patient Re-evaluated prior to induction Oxygen Delivery Method: Circle system utilized Preoxygenation: Pre-oxygenation with 100% oxygen Induction Type: IV induction and Rapid sequence Laryngoscope Size: Glidescope and 3 Grade View: Grade I Tube type: Oral Tube size: 7.5 mm Number of attempts: 1 Airway Equipment and Method: Stylet and Oral airway Placement Confirmation: ETT inserted through vocal cords under direct vision,  positive ETCO2 and breath sounds checked- equal and bilateral Secured at: 22 cm Tube secured with: Tape Dental Injury: Teeth and Oropharynx as per pre-operative assessment
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
## 2020-01-31 NOTE — Anesthesia Procedure Notes (Signed)
Arterial Line Insertion Start/End2/28/2021 11:00 AM, 01/31/2020 11:06 AM Performed by: Harden Mo, CRNA, CRNA  Patient location: OR. Preanesthetic checklist: patient identified, IV checked, site marked, risks and benefits discussed, surgical consent, monitors and equipment checked, pre-op evaluation and anesthesia consent Left, radial was placed Catheter size: 20 G Hand hygiene performed  and maximum sterile barriers used  Allen's test indicative of satisfactory collateral circulation Attempts: 1 Procedure performed without using ultrasound guided technique. Ultrasound Notes:anatomy identified, needle tip was noted to be adjacent to the nerve/plexus identified and no ultrasound evidence of intravascular and/or intraneural injection Following insertion, dressing applied and Biopatch. Post procedure assessment: decreased circulation  Patient tolerated the procedure well with no immediate complications.

## 2020-02-01 LAB — TYPE AND SCREEN
ABO/RH(D): A POS
Antibody Screen: NEGATIVE
Unit division: 0
Unit division: 0

## 2020-02-01 LAB — BPAM RBC
Blood Product Expiration Date: 202103242359
Blood Product Expiration Date: 202103242359
ISSUE DATE / TIME: 202102281121
ISSUE DATE / TIME: 202102281121
Unit Type and Rh: 6200
Unit Type and Rh: 6200

## 2020-02-01 LAB — CBC
HCT: 36.8 % (ref 36.0–46.0)
Hemoglobin: 11.9 g/dL — ABNORMAL LOW (ref 12.0–15.0)
MCH: 26.7 pg (ref 26.0–34.0)
MCHC: 32.3 g/dL (ref 30.0–36.0)
MCV: 82.7 fL (ref 80.0–100.0)
Platelets: 250 10*3/uL (ref 150–400)
RBC: 4.45 MIL/uL (ref 3.87–5.11)
RDW: 16.5 % — ABNORMAL HIGH (ref 11.5–15.5)
WBC: 15.3 10*3/uL — ABNORMAL HIGH (ref 4.0–10.5)
nRBC: 0 % (ref 0.0–0.2)

## 2020-02-01 LAB — BASIC METABOLIC PANEL
Anion gap: 12 (ref 5–15)
BUN: 15 mg/dL (ref 8–23)
CO2: 21 mmol/L — ABNORMAL LOW (ref 22–32)
Calcium: 8.8 mg/dL — ABNORMAL LOW (ref 8.9–10.3)
Chloride: 104 mmol/L (ref 98–111)
Creatinine, Ser: 1.03 mg/dL — ABNORMAL HIGH (ref 0.44–1.00)
GFR calc Af Amer: 59 mL/min — ABNORMAL LOW (ref >60)
GFR calc non Af Amer: 51 mL/min — ABNORMAL LOW (ref >60)
Glucose, Bld: 178 mg/dL — ABNORMAL HIGH (ref 70–99)
Potassium: 4 mmol/L (ref 3.5–5.1)
Sodium: 137 mmol/L (ref 135–145)

## 2020-02-01 LAB — GLUCOSE, CAPILLARY
Glucose-Capillary: 133 mg/dL — ABNORMAL HIGH (ref 70–99)
Glucose-Capillary: 123 mg/dL — ABNORMAL HIGH (ref 70–99)

## 2020-02-01 MED ORDER — MORPHINE SULFATE (PF) 2 MG/ML IV SOLN: 2.0000 mg | INTRAVENOUS | Status: DC | PRN

## 2020-02-01 MED ORDER — CHLORHEXIDINE GLUCONATE CLOTH 2 % EX PADS
6.0000 | MEDICATED_PAD | Freq: Every day | CUTANEOUS | Status: DC
  Administered 2020-02-01 – 2020-02-16 (×5): 6 via TOPICAL

## 2020-02-01 MED ORDER — SODIUM CHLORIDE 0.9 % IV BOLUS
500.0000 mL | Freq: Once | INTRAVENOUS | Status: AC
  Administered 2020-02-01: 500 mL via INTRAVENOUS

## 2020-02-01 MED ORDER — INSULIN ASPART 100 UNIT/ML ~~LOC~~ SOLN
0.0000 [IU] | Freq: Three times a day (TID) | SUBCUTANEOUS | Status: DC
  Administered 2020-02-01 – 2020-02-02 (×3): 2 [IU] via SUBCUTANEOUS

## 2020-02-01 MED ORDER — OXYCODONE HCL 5 MG PO TABS
5.0000 mg | ORAL_TABLET | ORAL | Status: DC | PRN
  Administered 2020-02-01 – 2020-02-12 (×12): 5 mg via ORAL
  Filled 2020-02-01 (×12): qty 1
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
## 2020-02-01 NOTE — Progress Notes (Signed)
Spoke with patient's daughter, [name]. She wanted staff to know that patient has not taken her klonopin in about two weeks.

## 2020-02-02 ENCOUNTER — Encounter: Payer: Self-pay | Admitting: *Deleted

## 2020-02-02 ENCOUNTER — Other Ambulatory Visit: Payer: Self-pay | Admitting: Cardiovascular Disease

## 2020-02-02 ENCOUNTER — Telehealth (HOSPITAL_COMMUNITY): Payer: Self-pay | Admitting: *Deleted

## 2020-02-02 ENCOUNTER — Other Ambulatory Visit (HOSPITAL_COMMUNITY): Payer: Self-pay | Admitting: Psychiatry

## 2020-02-02 DIAGNOSIS — F411 Generalized anxiety disorder: Secondary | ICD-10-CM

## 2020-02-02 DIAGNOSIS — F331 Major depressive disorder, recurrent, moderate: Secondary | ICD-10-CM

## 2020-02-02 LAB — BASIC METABOLIC PANEL
Anion gap: 10 (ref 5–15)
BUN: 12 mg/dL (ref 8–23)
CO2: 24 mmol/L (ref 22–32)
Calcium: 8.9 mg/dL (ref 8.9–10.3)
Chloride: 105 mmol/L (ref 98–111)
Creatinine, Ser: 0.86 mg/dL (ref 0.44–1.00)
GFR calc Af Amer: >60 mL/min (ref >60)
GFR calc non Af Amer: >60 mL/min (ref >60)
Glucose, Bld: 97 mg/dL (ref 70–99)
Potassium: 3.3 mmol/L — ABNORMAL LOW (ref 3.5–5.1)
Sodium: 139 mmol/L (ref 135–145)

## 2020-02-02 LAB — CBC
HCT: 31.5 % — ABNORMAL LOW (ref 36.0–46.0)
Hemoglobin: 10 g/dL — ABNORMAL LOW (ref 12.0–15.0)
MCH: 26.7 pg (ref 26.0–34.0)
MCHC: 31.7 g/dL (ref 30.0–36.0)
MCV: 84.2 fL (ref 80.0–100.0)
Platelets: 214 10*3/uL (ref 150–400)
RBC: 3.74 MIL/uL — ABNORMAL LOW (ref 3.87–5.11)
RDW: 16.6 % — ABNORMAL HIGH (ref 11.5–15.5)
WBC: 11 10*3/uL — ABNORMAL HIGH (ref 4.0–10.5)
nRBC: 0 % (ref 0.0–0.2)

## 2020-02-02 LAB — GLUCOSE, CAPILLARY
Glucose-Capillary: 98 mg/dL (ref 70–99)
Glucose-Capillary: 129 mg/dL — ABNORMAL HIGH (ref 70–99)
Glucose-Capillary: 100 mg/dL — ABNORMAL HIGH (ref 70–99)

## 2020-02-02 LAB — MAGNESIUM: Magnesium: 1.6 mg/dL — ABNORMAL LOW (ref 1.7–2.4)

## 2020-02-02 MED ORDER — POTASSIUM CHLORIDE CRYS ER 20 MEQ PO TBCR
40.0000 meq | EXTENDED_RELEASE_TABLET | Freq: Two times a day (BID) | ORAL | Status: AC
  Administered 2020-02-02 (×2): 40 meq via ORAL
  Filled 2020-02-02 (×2): qty 2

## 2020-02-02 MED ORDER — FENTANYL CITRATE (PF) 100 MCG/2ML IJ SOLN: 12.5000 ug | INTRAMUSCULAR | Status: DC | PRN

## 2020-02-02 MED ORDER — BOOST / RESOURCE BREEZE PO LIQD CUSTOM
1.0000 | Freq: Two times a day (BID) | ORAL | Status: DC
  Administered 2020-02-02: 1 via ORAL

## 2020-02-02 MED ORDER — MAGNESIUM SULFATE IN D5W 1-5 GM/100ML-% IV SOLN
1.0000 g | Freq: Once | INTRAVENOUS | Status: AC
  Administered 2020-02-02: 1 g via INTRAVENOUS
  Filled 2020-02-02: qty 100

## 2020-02-02 MED ORDER — ACETAMINOPHEN 325 MG PO TABS
650.0000 mg | ORAL_TABLET | Freq: Four times a day (QID) | ORAL | Status: DC
  Administered 2020-02-02 – 2020-02-16 (×54): 650 mg via ORAL
  Filled 2020-02-02 (×54): qty 2

## 2020-02-02 MED ORDER — POLYETHYLENE GLYCOL 3350 17 G PO PACK
17.0000 g | PACK | Freq: Every day | ORAL | Status: DC
  Administered 2020-02-02 – 2020-02-10 (×8): 17 g via ORAL
  Filled 2020-02-02 (×10): qty 1

## 2020-02-02 NOTE — Telephone Encounter (Signed)
Writer spoke with Beecher Mcardle with [facility] TTS, therapeutic  triage services, who was returning writer call regarding pt psych assessment and referral. Pt was assessed by Letitia Libra ARNP yesterday who is recommending inpt geri psych. So pt will have to be medically cleared and a bed available at [facility].

## 2020-02-02 NOTE — Telephone Encounter (Signed)
Thanks
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
## 2020-02-02 NOTE — Telephone Encounter (Signed)
What is ROI? Did they try psych consultation service?
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
## 2020-02-02 NOTE — Telephone Encounter (Signed)
FYI daughter, [name], states pt also pt positive Covid.
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
## 2020-02-02 NOTE — Progress Notes (Signed)
CSW acknowledges inpatient psych consult once medically stable. Barriers include need for COVID geropsych bed. CSW contacted [facility] can accept COVID positive patients. Will continue to follow up.     Catherine Locus [name] LCSW [phone]

## 2020-02-03 LAB — MAGNESIUM: Magnesium: 1.9 mg/dL (ref 1.7–2.4)

## 2020-02-03 LAB — CBC
HCT: 31.7 % — ABNORMAL LOW (ref 36.0–46.0)
Hemoglobin: 9.8 g/dL — ABNORMAL LOW (ref 12.0–15.0)
MCH: 26.3 pg (ref 26.0–34.0)
MCHC: 30.9 g/dL (ref 30.0–36.0)
MCV: 85.2 fL (ref 80.0–100.0)
Platelets: 211 10*3/uL (ref 150–400)
RBC: 3.72 MIL/uL — ABNORMAL LOW (ref 3.87–5.11)
RDW: 16.4 % — ABNORMAL HIGH (ref 11.5–15.5)
WBC: 8.4 10*3/uL (ref 4.0–10.5)
nRBC: 0 % (ref 0.0–0.2)

## 2020-02-03 LAB — GLUCOSE, CAPILLARY
Glucose-Capillary: 94 mg/dL (ref 70–99)
Glucose-Capillary: 115 mg/dL — ABNORMAL HIGH (ref 70–99)
Glucose-Capillary: 88 mg/dL (ref 70–99)
Glucose-Capillary: 118 mg/dL — ABNORMAL HIGH (ref 70–99)

## 2020-02-03 LAB — BASIC METABOLIC PANEL
Anion gap: 7 (ref 5–15)
BUN: 9 mg/dL (ref 8–23)
CO2: 25 mmol/L (ref 22–32)
Calcium: 8.9 mg/dL (ref 8.9–10.3)
Chloride: 106 mmol/L (ref 98–111)
Creatinine, Ser: 0.74 mg/dL (ref 0.44–1.00)
GFR calc Af Amer: >60 mL/min (ref >60)
GFR calc non Af Amer: >60 mL/min (ref >60)
Glucose, Bld: 86 mg/dL (ref 70–99)
Potassium: 3.7 mmol/L (ref 3.5–5.1)
Sodium: 138 mmol/L (ref 135–145)
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
## 2020-02-04 LAB — GLUCOSE, CAPILLARY
Glucose-Capillary: 71 mg/dL (ref 70–99)
Glucose-Capillary: 73 mg/dL (ref 70–99)
Glucose-Capillary: 73 mg/dL (ref 70–99)
Glucose-Capillary: 95 mg/dL (ref 70–99)

## 2020-02-04 MED ORDER — PRO-STAT SUGAR FREE PO LIQD
30.0000 mL | Freq: Two times a day (BID) | ORAL | Status: DC
  Administered 2020-02-04 – 2020-02-08 (×8): 30 mL via ORAL
  Filled 2020-02-04 (×11): qty 30

## 2020-02-04 MED ORDER — ENSURE ENLIVE PO LIQD
237.0000 mL | Freq: Three times a day (TID) | ORAL | Status: DC
  Administered 2020-02-04 – 2020-02-13 (×14): 237 mL via ORAL

## 2020-02-04 MED ORDER — GLYCERIN (LAXATIVE) 2.1 G RE SUPP
1.0000 | Freq: Once | RECTAL | Status: AC
  Administered 2020-02-04: 1 via RECTAL
  Filled 2020-02-04: qty 1
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
## 2020-02-04 NOTE — Progress Notes (Signed)
Updated [name] (daughter)

## 2020-02-05 LAB — MAGNESIUM: Magnesium: 1.6 mg/dL — ABNORMAL LOW (ref 1.7–2.4)

## 2020-02-05 LAB — BASIC METABOLIC PANEL
Anion gap: 11 (ref 5–15)
BUN: 8 mg/dL (ref 8–23)
CO2: 25 mmol/L (ref 22–32)
Calcium: 9.1 mg/dL (ref 8.9–10.3)
Chloride: 102 mmol/L (ref 98–111)
Creatinine, Ser: 0.75 mg/dL (ref 0.44–1.00)
GFR calc Af Amer: >60 mL/min (ref >60)
GFR calc non Af Amer: >60 mL/min (ref >60)
Glucose, Bld: 106 mg/dL — ABNORMAL HIGH (ref 70–99)
Potassium: 3.4 mmol/L — ABNORMAL LOW (ref 3.5–5.1)
Sodium: 138 mmol/L (ref 135–145)

## 2020-02-05 LAB — GLUCOSE, CAPILLARY
Glucose-Capillary: 103 mg/dL — ABNORMAL HIGH (ref 70–99)
Glucose-Capillary: 63 mg/dL — ABNORMAL LOW (ref 70–99)
Glucose-Capillary: 90 mg/dL (ref 70–99)
Glucose-Capillary: 104 mg/dL — ABNORMAL HIGH (ref 70–99)
Glucose-Capillary: 91 mg/dL (ref 70–99)
Glucose-Capillary: 81 mg/dL (ref 70–99)

## 2020-02-05 MED ORDER — MAGNESIUM SULFATE 2 GM/50ML IV SOLN
2.0000 g | Freq: Once | INTRAVENOUS | Status: AC
  Administered 2020-02-05: 2 g via INTRAVENOUS
  Filled 2020-02-05: qty 50

## 2020-02-05 MED ORDER — POTASSIUM CHLORIDE CRYS ER 20 MEQ PO TBCR
30.0000 meq | EXTENDED_RELEASE_TABLET | Freq: Two times a day (BID) | ORAL | Status: AC
  Administered 2020-02-05 (×2): 30 meq via ORAL
  Filled 2020-02-05 (×2): qty 1
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
## 2020-02-05 NOTE — Progress Notes (Signed)
Inpatient Diabetes Program Recommendations  AACE/ADA: New Consensus Statement on Inpatient Glycemic Control (2015)  Target Ranges:  Prepandial:   less than 140 mg/dL      Peak postprandial:   less than 180 mg/dL (1-2 hours)      Critically ill patients:  140 - 180 mg/dL   Lab Results  Component Value Date   GLUCAP 104 (H) 02/05/2020   HGBA1C 6.9 (H) 01/31/2020    Review of Glycemic Control Results for Catherine [name], Catherine [name] (MRN [id number]) as of 02/05/2020 12:10  Ref. Range 02/04/2020 08:27 02/04/2020 12:50 02/04/2020 16:10 02/04/2020 21:01 02/05/2020 07:32 02/05/2020 08:29 02/05/2020 11:39  Glucose-Capillary Latest Ref Range: 70 - 99 mg/dL 71 73 73 95 63 (L) 90 104 (H)   Diabetes history: DM2 Outpatient Diabetes medications: Lantus 16 units Current orders for Inpatient glycemic control: Lantus 16 units + Novolog moderate correction tid  Inpatient Diabetes Program Recommendations:   -Decrease Lantus to 8 units qd -Decrease Novolog correction to sensitive tid  Thank you, Nani Gasser. Napolean Sia, RN, MSN, CDE  Diabetes Coordinator Inpatient Glycemic Control Team Team Pager [phone] (8am-5pm) 02/05/2020 12:11 PM
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
## 2020-02-05 NOTE — Progress Notes (Signed)
Hypoglycemic Event  CBG: 63  Treatment: 4oz cola  Symptoms: none  Follow-up CBG: Time: 829 CBG Result:90  Possible Reasons for Event: patient has loss of appetite  Comments/MD notified:MD notified    Henrene Hawking

## 2020-02-05 NOTE — Progress Notes (Signed)
Spoke with [employer]

## 2020-02-05 NOTE — Progress Notes (Signed)
Production assistant, radio to room, patient reporting sudden onset numbness of fingertips 20 min prior. Patient was sitting in chair at rest when this happened. Slowly resolving. Will continue to monitor. MD notified.
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
## 2020-02-06 LAB — BASIC METABOLIC PANEL
Anion gap: 11 (ref 5–15)
BUN: 12 mg/dL (ref 8–23)
CO2: 27 mmol/L (ref 22–32)
Calcium: 9.4 mg/dL (ref 8.9–10.3)
Chloride: 101 mmol/L (ref 98–111)
Creatinine, Ser: 0.76 mg/dL (ref 0.44–1.00)
GFR calc Af Amer: >60 mL/min (ref >60)
GFR calc non Af Amer: >60 mL/min (ref >60)
Glucose, Bld: 69 mg/dL — ABNORMAL LOW (ref 70–99)
Potassium: 3.7 mmol/L (ref 3.5–5.1)
Sodium: 139 mmol/L (ref 135–145)

## 2020-02-06 LAB — GLUCOSE, CAPILLARY
Glucose-Capillary: 73 mg/dL (ref 70–99)
Glucose-Capillary: 81 mg/dL (ref 70–99)
Glucose-Capillary: 85 mg/dL (ref 70–99)
Glucose-Capillary: 109 mg/dL — ABNORMAL HIGH (ref 70–99)
Glucose-Capillary: 93 mg/dL (ref 70–99)

## 2020-02-06 LAB — MAGNESIUM: Magnesium: 2.1 mg/dL (ref 1.7–2.4)

## 2020-02-06 MED ORDER — INSULIN ASPART 100 UNIT/ML ~~LOC~~ SOLN
0.0000 [IU] | Freq: Three times a day (TID) | SUBCUTANEOUS | Status: DC
  Administered 2020-02-09: 1 [IU] via SUBCUTANEOUS
  Administered 2020-02-10 – 2020-02-11 (×3): 2 [IU] via SUBCUTANEOUS
  Administered 2020-02-12 – 2020-02-16 (×8): 1 [IU] via SUBCUTANEOUS

## 2020-02-06 MED ORDER — INSULIN GLARGINE 100 UNIT/ML ~~LOC~~ SOLN
8.0000 [IU] | Freq: Every day | SUBCUTANEOUS | Status: DC
  Administered 2020-02-06 – 2020-02-11 (×4): 8 [IU] via SUBCUTANEOUS
  Filled 2020-02-06 (×9): qty 0.08
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
## 2020-02-06 NOTE — Progress Notes (Addendum)
0745: Assumed care of Pt from night RN. Pt alert, calm, C/o some abd "discomfort" that tylenol works well for. VS WNL, V paced, room air, NS through PIV, poor PO intake. Comfortable up to chair, Sitter at bedside for safety, denies needs a this time.   1200: Remains comfortable up to chair, helped transfer to/from [facility], sitter at bedside, will monitor.   1700: Denies pain, SOB, or any needs at this time. States she feels a little better. Sitter at bedside

## 2020-02-07 LAB — GLUCOSE, CAPILLARY
Glucose-Capillary: 80 mg/dL (ref 70–99)
Glucose-Capillary: 101 mg/dL — ABNORMAL HIGH (ref 70–99)
Glucose-Capillary: 113 mg/dL — ABNORMAL HIGH (ref 70–99)
Glucose-Capillary: 126 mg/dL — ABNORMAL HIGH (ref 70–99)

## 2020-02-07 LAB — CREATININE, SERUM
Creatinine, Ser: 0.74 mg/dL (ref 0.44–1.00)
GFR calc Af Amer: >60 mL/min (ref >60)
GFR calc non Af Amer: >60 mL/min (ref >60)

## 2020-02-07 NOTE — Progress Notes (Signed)
Spoke to pt.'s daughter Tahjanae Kinzer and updated her about the pt.'s status, all questions answered.
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
## 2020-02-08 LAB — GLUCOSE, CAPILLARY
Glucose-Capillary: 90 mg/dL (ref 70–99)
Glucose-Capillary: 102 mg/dL — ABNORMAL HIGH (ref 70–99)
Glucose-Capillary: 107 mg/dL — ABNORMAL HIGH (ref 70–99)
Glucose-Capillary: 91 mg/dL (ref 70–99)

## 2020-02-08 NOTE — Plan of Care (Signed)
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
## 2020-02-08 NOTE — TOC Progression Note (Signed)
Transition of Care [facility]) - Progression Note    Patient Details  Name: Catherine [name] MRN: [id number] Date of Birth: 05-Sep-1941  Transition of Care [facility]) CM/SW [place], [place] Phone Number: 02/08/2020, 9:21 AM  Clinical Narrative:    [facility] unable to accept COVID+ patients. CSW creating [facility] referral today, as COVID status and age are continuing to be barrier to psych placement.    Expected Dis[facility] Plan: [facility] Barriers to Discharge: Continued Medical Work up, Psych Bed not available  Expected Discharge Plan and Services Expected Discharge Plan: [facility] In-house Referral: Clinical Social Work     Living arrangements for the past 2 months: Single Family Home                                       Social Determinants of Health (SDOH) Interventions    Readmission Risk Interventions No flowsheet data found.
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
## 2020-02-09 ENCOUNTER — Other Ambulatory Visit: Payer: Self-pay | Admitting: Cardiovascular Disease

## 2020-02-09 ENCOUNTER — Inpatient Hospital Stay (HOSPITAL_COMMUNITY): Payer: Medicare Other

## 2020-02-09 LAB — GLUCOSE, CAPILLARY
Glucose-Capillary: 95 mg/dL (ref 70–99)
Glucose-Capillary: 98 mg/dL (ref 70–99)
Glucose-Capillary: 124 mg/dL — ABNORMAL HIGH (ref 70–99)
Glucose-Capillary: 72 mg/dL (ref 70–99)

## 2020-02-09 LAB — CBC
HCT: 36.3 % (ref 36.0–46.0)
Hemoglobin: 11.3 g/dL — ABNORMAL LOW (ref 12.0–15.0)
MCH: 26.4 pg (ref 26.0–34.0)
MCHC: 31.1 g/dL (ref 30.0–36.0)
MCV: 84.8 fL (ref 80.0–100.0)
Platelets: 455 10*3/uL — ABNORMAL HIGH (ref 150–400)
RBC: 4.28 MIL/uL (ref 3.87–5.11)
RDW: 16.5 % — ABNORMAL HIGH (ref 11.5–15.5)
WBC: 12.5 10*3/uL — ABNORMAL HIGH (ref 4.0–10.5)
nRBC: 0 % (ref 0.0–0.2)

## 2020-02-09 LAB — BASIC METABOLIC PANEL
Anion gap: 14 (ref 5–15)
BUN: 11 mg/dL (ref 8–23)
CO2: 22 mmol/L (ref 22–32)
Calcium: 9.5 mg/dL (ref 8.9–10.3)
Chloride: 101 mmol/L (ref 98–111)
Creatinine, Ser: 0.95 mg/dL (ref 0.44–1.00)
GFR calc Af Amer: >60 mL/min (ref >60)
GFR calc non Af Amer: 57 mL/min — ABNORMAL LOW (ref >60)
Glucose, Bld: 132 mg/dL — ABNORMAL HIGH (ref 70–99)
Potassium: 3.7 mmol/L (ref 3.5–5.1)
Sodium: 137 mmol/L (ref 135–145)

## 2020-02-09 MED ORDER — SODIUM CHLORIDE 0.9 % IV BOLUS
500.0000 mL | Freq: Once | INTRAVENOUS | Status: AC
  Administered 2020-02-09: 500 mL via INTRAVENOUS

## 2020-02-09 NOTE — Progress Notes (Signed)
Notified Dr. Kieth Brightly that pt's CBG is 72 and has poor appetite. MD gave order to hold tonight's dose of Lantus 8 units. Will continue to monitor pt. Ranelle Oyster, RN

## 2020-02-09 NOTE — Progress Notes (Signed)
   02/09/20 1515  Vitals  Temp 98.5 F (36.9 C)  Temp Source Oral  BP 115/65  MAP (mmHg) 80  BP Location Right Arm  BP Method Automatic  Patient Position (if appropriate) Sitting  Pulse Rate (!) 110  ECG Heart Rate (!) 111  Resp (!) 22  Oxygen Therapy  SpO2 97 %  O2 Device Room Air  MEWS Score  MEWS Temp 0  MEWS Systolic 0  MEWS Pulse 2  MEWS RR 1  MEWS LOC 0  MEWS Score 3  MEWS Score Color Yellow  MEWS Assessment  Is this an acute change? Yes  Provider Notification  Provider Name/Title Trauma PA  Date Provider Notified 02/09/20  Time Provider Notified 1522  Notification Type Page  Notification Reason Change in status  Response See new orders  Date of Provider Response 02/09/20  Time of Provider Response 1524  Pt HR elevated in 110s w/ RR in mid-upper 20s. Pt c/o abdominal pain 6/10, no other complaints. Other VSS. Trauma PA paged. CBC and BMP ordered. DG Abd/chest ordered. Will continue to monitor.
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
## 2020-02-09 NOTE — Telephone Encounter (Signed)
LMOV to verify how patient was currently taking Losartan.

## 2020-02-09 NOTE — TOC Progression Note (Signed)
Transition of Care [facility]) - Progression Note    Patient Details  Name: Catherine [name] MRN: [id number] Date of Birth: 1941-07-13  Transition of Care [facility]) CM/SW [place], [place] Phone Number: 02/09/2020, 8:39 AM  Clinical Narrative:    CSW confirmed receipt of referral at [facility] and completed demographics screening. Their RN will contact CSW back to see if any additional notes are requested.    Expected Discharge Plan: Psychiatric Hospital Barriers to Discharge: Continued Medical Work up, Psych Bed not available  Expected Discharge Plan and Services Expected Discharge Plan: [facility] In-house Referral: Clinical Social Work     Living arrangements for the past 2 months: Single Family Home                                       Social Determinants of Health (SDOH) Interventions    Readmission Risk Interventions No flowsheet data found.
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
## 2020-02-10 LAB — GLUCOSE, CAPILLARY
Glucose-Capillary: 116 mg/dL — ABNORMAL HIGH (ref 70–99)
Glucose-Capillary: 115 mg/dL — ABNORMAL HIGH (ref 70–99)
Glucose-Capillary: 173 mg/dL — ABNORMAL HIGH (ref 70–99)
Glucose-Capillary: 79 mg/dL (ref 70–99)

## 2020-02-10 MED ORDER — BOOST / RESOURCE BREEZE PO LIQD CUSTOM
1.0000 | Freq: Three times a day (TID) | ORAL | Status: DC
  Administered 2020-02-10 – 2020-02-13 (×8): 1 via ORAL

## 2020-02-10 MED ORDER — PRO-STAT SUGAR FREE PO LIQD
30.0000 mL | Freq: Three times a day (TID) | ORAL | Status: DC
  Administered 2020-02-10 – 2020-02-13 (×6): 30 mL via ORAL
  Filled 2020-02-10 (×10): qty 30
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
## 2020-02-10 NOTE — TOC Progression Note (Signed)
Transition of Care [facility]) - Progression Note    Patient Details  Name: ALLYNE REPOSA MRN: [id number] Date of Birth: 15-Jan-1941  Transition of Care [facility]) CM/SW [place], LCSW Phone Number: 02/10/2020, 1:15 PM  Clinical Narrative:    [facility] states referral is still being reviewed and they will contact CSW with updates.    Expected Discharge Plan: Psychiatric Hospital Barriers to Discharge: Continued Medical Work up, Psych Bed not available  Expected Discharge Plan and Services Expected Discharge Plan: [facility] In-house Referral: Clinical Social Work     Living arrangements for the past 2 months: Single Family Home                                       Social Determinants of Health (SDOH) Interventions    Readmission Risk Interventions No flowsheet data found.
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
## 2020-02-11 LAB — GLUCOSE, CAPILLARY
Glucose-Capillary: 102 mg/dL — ABNORMAL HIGH (ref 70–99)
Glucose-Capillary: 175 mg/dL — ABNORMAL HIGH (ref 70–99)
Glucose-Capillary: 155 mg/dL — ABNORMAL HIGH (ref 70–99)
Glucose-Capillary: 113 mg/dL — ABNORMAL HIGH (ref 70–99)
Glucose-Capillary: 65 mg/dL — ABNORMAL LOW (ref 70–99)
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
## 2020-02-12 LAB — GLUCOSE, CAPILLARY
Glucose-Capillary: 122 mg/dL — ABNORMAL HIGH (ref 70–99)
Glucose-Capillary: 112 mg/dL — ABNORMAL HIGH (ref 70–99)
Glucose-Capillary: 147 mg/dL — ABNORMAL HIGH (ref 70–99)
Glucose-Capillary: 101 mg/dL — ABNORMAL HIGH (ref 70–99)

## 2020-02-12 LAB — URINALYSIS, ROUTINE W REFLEX MICROSCOPIC
Bilirubin Urine: NEGATIVE
Glucose, UA: NEGATIVE mg/dL
Ketones, ur: NEGATIVE mg/dL
Nitrite: NEGATIVE
Protein, ur: NEGATIVE mg/dL
Specific Gravity, Urine: 1.012 (ref 1.005–1.030)
pH: 6 (ref 5.0–8.0)

## 2020-02-12 MED ORDER — CEPHALEXIN 500 MG PO CAPS
500.0000 mg | ORAL_CAPSULE | Freq: Two times a day (BID) | ORAL | Status: DC
  Administered 2020-02-12 – 2020-02-13 (×4): 500 mg via ORAL
  Filled 2020-02-12 (×4): qty 1

## 2020-02-12 NOTE — TOC Progression Note (Signed)
Transition of Care [facility]) - Progression Note    Patient Details  Name: Catherine [name] MRN: [id number] Date of Birth: 1941-07-24  Transition of Care [facility] CM/SW [place], LCSW Phone Number: 02/12/2020, 9:12 AM  Clinical Narrative:    CSW spoke with [facility]; they stated that patient is still under review and their staff will contact CSW if anything is needed.    [facility] Barriers to Discharge: Continued Medical Work up, Psych Bed not available  Expected Discharge Plan and Services Expected Discharge Plan: [facility] In-house Referral: Clinical Social Work     Living arrangements for the past 2 months: Single Family Home                                       Social Determinants of Health (SDOH) Interventions    Readmission Risk Interventions No flowsheet data found.
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
## 2020-02-13 DIAGNOSIS — F332 Major depressive disorder, recurrent severe without psychotic features: Secondary | ICD-10-CM

## 2020-02-13 LAB — GLUCOSE, CAPILLARY
Glucose-Capillary: 117 mg/dL — ABNORMAL HIGH (ref 70–99)
Glucose-Capillary: 142 mg/dL — ABNORMAL HIGH (ref 70–99)
Glucose-Capillary: 113 mg/dL — ABNORMAL HIGH (ref 70–99)
Glucose-Capillary: 108 mg/dL — ABNORMAL HIGH (ref 70–99)

## 2020-02-13 MED ORDER — POLYETHYLENE GLYCOL 3350 17 G PO PACK: 17.0000 g | PACK | Freq: Every day | ORAL | Status: DC | PRN

## 2020-02-13 MED ORDER — PAROXETINE HCL 30 MG PO TABS
30.0000 mg | ORAL_TABLET | Freq: Every day | ORAL | Status: DC
  Administered 2020-02-14 – 2020-02-16 (×3): 30 mg via ORAL
  Filled 2020-02-13 (×3): qty 1
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
## 2020-02-14 ENCOUNTER — Encounter (HOSPITAL_COMMUNITY): Payer: Self-pay

## 2020-02-14 LAB — URINE CULTURE: Culture: >=100000 — AB

## 2020-02-14 LAB — GLUCOSE, CAPILLARY
Glucose-Capillary: 134 mg/dL — ABNORMAL HIGH (ref 70–99)
Glucose-Capillary: 105 mg/dL — ABNORMAL HIGH (ref 70–99)
Glucose-Capillary: 133 mg/dL — ABNORMAL HIGH (ref 70–99)
Glucose-Capillary: 102 mg/dL — ABNORMAL HIGH (ref 70–99)

## 2020-02-14 LAB — CREATININE, SERUM
Creatinine, Ser: 0.65 mg/dL (ref 0.44–1.00)
GFR calc Af Amer: >60 mL/min (ref >60)
GFR calc non Af Amer: >60 mL/min (ref >60)

## 2020-02-14 MED ORDER — LEVOFLOXACIN 250 MG PO TABS
250.0000 mg | ORAL_TABLET | Freq: Every day | ORAL | Status: AC
  Administered 2020-02-14 – 2020-02-16 (×3): 250 mg via ORAL
  Filled 2020-02-14 (×4): qty 1

## 2020-02-14 MED ORDER — PIPERACILLIN-TAZOBACTAM 3.375 G IVPB
3.3750 g | Freq: Three times a day (TID) | INTRAVENOUS | Status: DC
  Administered 2020-02-14: 3.375 g via INTRAVENOUS
  Filled 2020-02-14: qty 50
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
## 2020-02-14 NOTE — Plan of Care (Signed)

## 2020-02-14 NOTE — Progress Notes (Signed)
Pharmacy Antibiotic Note  Catherine [name] is a 79 y.o. female admitted on 01/31/2020 with UTI.  Pharmacy was consulted for Zosyn dosing. After discussion with ID pharmacist and PO, PO levofloxacin x3 days was determined to be a good option for this patient. Patient received one dose of zosyn 3.375 IV (4 hr infusion) will transition to levofloxacin this afternoon.   Plan: Received zosyn 3.375 mg IV x1 dose Levofloxacin 250 mg PO q24hrs starting at 1400 for 3 days  Monitor clinical improvement and renal function   Height: 5\' 3"  (160 cm) Weight: 185 lb (83.9 kg) IBW/kg (Calculated) : 52.4  Temp (24hrs), Avg:97.8 F (36.6 C), Min:97.5 F (36.4 C), Max:97.9 F (36.6 C)  Recent Labs  Lab 02/09/20 1610 02/14/20 0711  WBC 12.5*  --   CREATININE 0.95 0.65    Estimated Creatinine Clearance: 57.6 mL/min (by C-G formula based on SCr of 0.65 mg/dL).    Allergies  Allergen Reactions  . Tetracyclines & Related Anaphylaxis and Rash  . Latex   . Sulfa Antibiotics Hives    Antimicrobials this admission: 3/12 Keflex >> 3/14 3/14 Zosyn x1 dose 3/14 Levofloxacin >> (3/16)  Dose adjustments this admission:   Microbiology results: 3/14 UCx:  100K Pseudomonas, enterococcus   Thank you for allowing pharmacy to be a part of this patient's care.  Acey Lav, PharmD  PGY1 Acute Care Pharmacy Resident 02/14/2020 9:05 AM

## 2020-02-14 NOTE — Progress Notes (Signed)
   02/14/20 1815  Clinical Encounter Type  Visited With Family;Health care provider  Visit Type Follow-up;Behavioral Health  Referral From Nurse  Consult/Referral To Chaplain  Spiritual Encounters  Spiritual Needs Other (Comment) (suicidal ideations)  Stress Factors  Patient Stress Factors Financial concerns  Family Stress Factors Financial concerns   Chaplain responded to consult for suicidal ideations. Hoor was receptive of Chaplain visit for the first few minutes. Towards the end of the visit it became noticeable that Doreatha was becoming agitated. Celsey told Chaplain she no longer has suicidal ideations. Inez Catalina asked chaplain how Chaplain knew of suicide attempts. Chaplain informed Renata that this Bonney Roussel was the [place] working when Brandye was brought to the ER after harming herself two weeks ago, and that her doctor can send me notes if they feel like she needs support or to have someone check in to be sure she is doing okay with things that are going on. Chaplain told Tyease that I had received a note that she was having a rough day and feeling like she may want to harm herself, so I was stopping by to check on her. Lavenia said she was going to have company. Then pointed to her sitter and said, "do you know who he is?" Chaplain said, "Yes, Temekia that is Barrister's clerk. He is here so that we can be sure we keep you safe. Ysabela made a reply about two girls switching, and then changed the subject. Chaplain asked Meritt about her children. Jacques politely refused to entertain Chaplain's question about her children's names, saying, "that is private business." Chaplain promised Bartow her information would not leave the hospital and that we are doing our part to take good care of her. Chaplain noticing Dahna becoming a little agitated, asked if there was anything else [facility] needed. Sylvanna said she could pray for herself. Chaplain reminded Derrian that Chaplains are available around the clock if she ever needs  further support. Chaplain wished Apsara a good night.  Chaplains remain available for support as needs arise.   Chaplain Resident, Evelene Croon, M Div [phone] on-call pager

## 2020-02-15 LAB — GLUCOSE, CAPILLARY
Glucose-Capillary: 123 mg/dL — ABNORMAL HIGH (ref 70–99)
Glucose-Capillary: 110 mg/dL — ABNORMAL HIGH (ref 70–99)
Glucose-Capillary: 133 mg/dL — ABNORMAL HIGH (ref 70–99)
Glucose-Capillary: 112 mg/dL — ABNORMAL HIGH (ref 70–99)
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
## 2020-02-15 NOTE — Social Work (Signed)
EDCSW received call from [facility] @18 :00 requesting faxed copies of  IVC paperwork Discharge summary Last 5 physician's progress notes Last 5 days medication list Last 5 days vital signs.  EDCSW was unable to locate copy of IVC paperwork in chart and since Pt is not discharged cannot send discharge summary.  All other material was faxed out out 19:15 to [place] MSW [place] Worker  [facility]  [phone]

## 2020-02-15 NOTE — TOC Progression Note (Signed)
Transition of Care [facility]) - Progression Note    Patient Details  Name: Catherine [name] MRN: [id number] Date of Birth: [birth year]  [facility] CM/SW Contact  Oren Section Cleta Alberts, RN Phone Number: 02/15/2020, 2:48 PM  Clinical Narrative:   [facility] intake; confirmed that pt is still on waiting list for admission.     Expected Discharge Plan: Psychiatric Hospital Barriers to Discharge: Continued Medical Work up, Psych Bed not available  Expected Discharge Plan and Services Expected Discharge Plan: Psychiatric Hospital In-house Referral: Clinical Social Work     Living arrangements for the past 2 months: [place]                         Reinaldo Raddle, RN, BSN  Trauma/Neuro ICU Case Manager [phone]

## 2020-02-15 NOTE — Plan of Care (Signed)
  Problem: Clinical Measurements: Goal: Respiratory complications will improve Outcome: Progressing   
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
## 2020-02-15 NOTE — Telephone Encounter (Signed)
Left second message requesting to have patient call back to verify how she is taking losartan.

## 2020-02-16 LAB — GLUCOSE, CAPILLARY
Glucose-Capillary: 124 mg/dL — ABNORMAL HIGH (ref 70–99)
Glucose-Capillary: 104 mg/dL — ABNORMAL HIGH (ref 70–99)
Glucose-Capillary: 112 mg/dL — ABNORMAL HIGH (ref 70–99)

## 2020-02-16 MED ORDER — ENSURE ENLIVE PO LIQD: 237.0000 mL | Freq: Three times a day (TID) | ORAL | 12 refills | Status: DC

## 2020-02-16 MED ORDER — PAROXETINE HCL 30 MG PO TABS: 30.0000 mg | ORAL_TABLET | Freq: Every day | ORAL | Status: DC

## 2020-02-16 MED ORDER — DOCUSATE SODIUM 100 MG PO CAPS: 100.0000 mg | ORAL_CAPSULE | Freq: Two times a day (BID) | ORAL | 0 refills | Status: DC

## 2020-02-16 MED ORDER — PRO-STAT SUGAR FREE PO LIQD: 30.0000 mL | Freq: Three times a day (TID) | ORAL | 0 refills | Status: DC

## 2020-02-16 NOTE — Progress Notes (Signed)
Catherine [name] to be discharged to [facility]  per MD order. VVS, Pt has abdominal incision. It is clean, dry and intact. IV catheter discontinued intact. Site without signs and symptoms of complications. Dressing and pressure applied.  An After Visit Summary was printed and given to the transporting officers.  Waiting on paperwork to be completed by officers and Education officer, museum. Catherine [name]  02/16/2020 6:30 PM

## 2020-02-16 NOTE — Progress Notes (Signed)
Patient discharged to [facility] by officers. Report given to [facility], RN at [facility].
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
## 2020-02-16 NOTE — Care Management Important Message (Signed)
Important Message  Patient Details  Name: CARRELL KNEIB MRN: [id number] Date of Birth: August 05, 1941   Medicare Important Message Given:  Yes - Important Message mailed due to current National Emergency  Verbal consent obtained due to current National Emergency  Relationship to patient: Self Contact Name: Lavett Lips Call Date: 02/16/20  Time: 1417 Phone: [phone] Outcome: Spoke with contact Important Message mailed to: Patient address on file    Delorse Lek 02/16/2020, 2:17 PM
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
## 2020-02-16 NOTE — TOC Transition Note (Addendum)
Transition of Care [facility]) - CM/SW Discharge Note *02/16/20 - Discharged to [facility] via [facility]   Patient Details  Name: Catherine [name] MRN: [id number] Date of Birth: July 13, 1941  Transition of Care [facility]) CM/SW Contact:  Sable Feil, LCSW Phone Number: 02/16/2020, 6:47 PM   Clinical Narrative: CSW contacted this morning regarding bed availability for patient and d/c today.  Contacted later today regarding patient being able to discharge after additional information received and reviewed by medical director. Nurse provided with number to call report. Daughter Tamyah Fiest [phone]) contacted and advised of discharge to [facility] this evening.   Patient being transported by [facility].    Final next level of care: Psychiatric Hospital([facility]) Barriers to Discharge: Barriers Resolved   Patient Goals and CMS Choice Patient states their goals for this hospitalization and ongoing recovery are:: Feel better CMS Medicare.gov Compare Post Acute Care list provided to:: Other (Comment Required)(n/a) Choice offered to / list presented to : NA  Discharge Placement                    Patient and family notified of of transfer: 02/16/20(Patient advised of dicharge by nursing)  Discharge Plan and Services In-house Referral: Clinical Social Work                                   Social Determinants of Health ([place]) Interventions     Readmission Risk Interventions No flowsheet data found.

## 2020-02-17 ENCOUNTER — Encounter: Payer: Self-pay | Admitting: Urology

## 2020-03-03 ENCOUNTER — Other Ambulatory Visit: Payer: Medicare Other

## 2020-03-30 ENCOUNTER — Ambulatory Visit (HOSPITAL_COMMUNITY): Payer: Medicare Other | Admitting: Psychiatry

## 2020-03-31 ENCOUNTER — Other Ambulatory Visit: Payer: Self-pay

## 2020-03-31 ENCOUNTER — Telehealth (INDEPENDENT_AMBULATORY_CARE_PROVIDER_SITE_OTHER): Payer: Medicare Other | Admitting: Psychiatry

## 2020-03-31 ENCOUNTER — Encounter (HOSPITAL_COMMUNITY): Payer: Self-pay | Admitting: Psychiatry

## 2020-03-31 DIAGNOSIS — F411 Generalized anxiety disorder: Secondary | ICD-10-CM | POA: Diagnosis not present

## 2020-03-31 DIAGNOSIS — F331 Major depressive disorder, recurrent, moderate: Secondary | ICD-10-CM

## 2020-03-31 MED ORDER — MIRTAZAPINE 15 MG PO TABS: 15.0000 mg | ORAL_TABLET | Freq: Every day | ORAL | 0 refills | Status: DC

## 2020-03-31 MED ORDER — ARIPIPRAZOLE 10 MG PO TABS: 10.0000 mg | ORAL_TABLET | Freq: Every day | ORAL | 0 refills | Status: DC

## 2020-03-31 MED ORDER — SERTRALINE HCL 50 MG PO TABS: 125.0000 mg | ORAL_TABLET | Freq: Every day | ORAL | 0 refills | Status: DC

## 2020-03-31 MED ORDER — CLONAZEPAM 0.5 MG PO TABS: 0.5000 mg | ORAL_TABLET | Freq: Every day | ORAL | 2 refills | Status: DC
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
## 2020-04-01 DIAGNOSIS — J45909 Unspecified asthma, uncomplicated: Secondary | ICD-10-CM | POA: Insufficient documentation

## 2020-04-06 ENCOUNTER — Ambulatory Visit: Payer: Medicare Other | Admitting: Cardiovascular Disease

## 2020-04-14 ENCOUNTER — Ambulatory Visit (HOSPITAL_COMMUNITY): Payer: Medicare Other | Admitting: Psychiatry

## 2020-04-14 ENCOUNTER — Ambulatory Visit (INDEPENDENT_AMBULATORY_CARE_PROVIDER_SITE_OTHER): Payer: Medicare Other | Admitting: *Deleted

## 2020-04-14 DIAGNOSIS — I442 Atrioventricular block, complete: Secondary | ICD-10-CM | POA: Diagnosis not present

## 2020-04-15 ENCOUNTER — Telehealth: Payer: Self-pay

## 2020-04-15 NOTE — Telephone Encounter (Signed)
Left message for patient to remind of missed remote transmission.  

## 2020-04-18 NOTE — Progress Notes (Signed)
Remote pacemaker transmission.   

## 2020-04-27 ENCOUNTER — Other Ambulatory Visit: Payer: Self-pay

## 2020-04-27 ENCOUNTER — Telehealth (HOSPITAL_COMMUNITY): Payer: Medicare Other | Admitting: Psychiatry

## 2020-05-04 ENCOUNTER — Encounter (HOSPITAL_COMMUNITY): Payer: Self-pay | Admitting: Psychiatry

## 2020-05-04 ENCOUNTER — Telehealth (INDEPENDENT_AMBULATORY_CARE_PROVIDER_SITE_OTHER): Payer: Medicare Other | Admitting: Psychiatry

## 2020-05-04 ENCOUNTER — Other Ambulatory Visit: Payer: Self-pay

## 2020-05-04 DIAGNOSIS — F331 Major depressive disorder, recurrent, moderate: Secondary | ICD-10-CM

## 2020-05-04 MED ORDER — ARIPIPRAZOLE 10 MG PO TABS: 10.0000 mg | ORAL_TABLET | Freq: Every day | ORAL | 1 refills | Status: DC

## 2020-05-04 MED ORDER — SERTRALINE HCL 50 MG PO TABS: 125.0000 mg | ORAL_TABLET | Freq: Every day | ORAL | 1 refills | Status: DC

## 2020-05-04 MED ORDER — MIRTAZAPINE 15 MG PO TABS: 15.0000 mg | ORAL_TABLET | Freq: Every day | ORAL | 1 refills | Status: DC
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
## 2020-05-27 ENCOUNTER — Other Ambulatory Visit (HOSPITAL_COMMUNITY): Payer: Self-pay | Admitting: Psychiatry

## 2020-05-27 DIAGNOSIS — F331 Major depressive disorder, recurrent, moderate: Secondary | ICD-10-CM

## 2020-06-22 ENCOUNTER — Other Ambulatory Visit (HOSPITAL_COMMUNITY): Payer: Self-pay | Admitting: Psychiatry

## 2020-06-22 DIAGNOSIS — F331 Major depressive disorder, recurrent, moderate: Secondary | ICD-10-CM

## 2020-06-27 ENCOUNTER — Telehealth (INDEPENDENT_AMBULATORY_CARE_PROVIDER_SITE_OTHER): Payer: Medicare Other | Admitting: Psychiatry

## 2020-06-27 ENCOUNTER — Other Ambulatory Visit: Payer: Self-pay

## 2020-06-27 DIAGNOSIS — R251 Tremor, unspecified: Secondary | ICD-10-CM | POA: Diagnosis not present

## 2020-06-27 DIAGNOSIS — F331 Major depressive disorder, recurrent, moderate: Secondary | ICD-10-CM | POA: Diagnosis not present

## 2020-06-27 DIAGNOSIS — F419 Anxiety disorder, unspecified: Secondary | ICD-10-CM | POA: Diagnosis not present

## 2020-06-27 MED ORDER — CLONAZEPAM 0.5 MG PO TABS: 0.5000 mg | ORAL_TABLET | Freq: Every day | ORAL | 1 refills | Status: DC

## 2020-06-27 MED ORDER — ARIPIPRAZOLE 10 MG PO TABS: 10.0000 mg | ORAL_TABLET | Freq: Every day | ORAL | 1 refills | Status: DC

## 2020-06-27 MED ORDER — SERTRALINE HCL 50 MG PO TABS: 125.0000 mg | ORAL_TABLET | Freq: Every day | ORAL | 1 refills | Status: DC

## 2020-06-27 MED ORDER — HYDROXYZINE HCL 10 MG PO TABS: ORAL_TABLET | ORAL | 1 refills | Status: DC

## 2020-06-27 MED ORDER — MIRTAZAPINE 15 MG PO TABS: 15.0000 mg | ORAL_TABLET | Freq: Every day | ORAL | 1 refills | Status: DC
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
## 2020-06-29 ENCOUNTER — Other Ambulatory Visit (HOSPITAL_COMMUNITY): Payer: Self-pay | Admitting: Psychiatry

## 2020-06-29 DIAGNOSIS — F331 Major depressive disorder, recurrent, moderate: Secondary | ICD-10-CM

## 2020-06-29 DIAGNOSIS — F411 Generalized anxiety disorder: Secondary | ICD-10-CM

## 2020-07-05 DIAGNOSIS — R2681 Unsteadiness on feet: Secondary | ICD-10-CM | POA: Insufficient documentation

## 2020-07-05 DIAGNOSIS — G251 Drug-induced tremor: Secondary | ICD-10-CM | POA: Insufficient documentation

## 2020-07-14 ENCOUNTER — Ambulatory Visit (INDEPENDENT_AMBULATORY_CARE_PROVIDER_SITE_OTHER): Payer: Medicare Other | Admitting: *Deleted

## 2020-07-14 DIAGNOSIS — I442 Atrioventricular block, complete: Secondary | ICD-10-CM | POA: Diagnosis not present

## 2020-07-15 LAB — CUP PACEART REMOTE DEVICE CHECK
Battery Remaining Longevity: 110 mo
Battery Remaining Percentage: 95.5 %
Battery Voltage: 2.96 V
Brady Statistic AP VP Percent: 2.2 %
Brady Statistic AP VS Percent: 3.9 %
Brady Statistic AS VP Percent: 43 %
Brady Statistic AS VS Percent: 51 %
Brady Statistic RA Percent Paced: 5.8 %
Brady Statistic RV Percent Paced: 45 %
Date Time Interrogation Session: 20210813015021
Implantable Lead Implant Date: 20160928
Implantable Lead Implant Date: 20160928
Implantable Lead Location: 753859
Implantable Lead Location: 753860
Implantable Pulse Generator Implant Date: 20160928
Lead Channel Impedance Value: 380 Ohm
Lead Channel Impedance Value: 480 Ohm
Lead Channel Pacing Threshold Amplitude: 0.75 V
Lead Channel Pacing Threshold Amplitude: 0.75 V
Lead Channel Pacing Threshold Pulse Width: 0.4 ms
Lead Channel Pacing Threshold Pulse Width: 0.4 ms
Lead Channel Sensing Intrinsic Amplitude: 4.3 mV
Lead Channel Sensing Intrinsic Amplitude: 6.4 mV
Lead Channel Setting Pacing Amplitude: 2 V
Lead Channel Setting Pacing Amplitude: 2.5 V
Lead Channel Setting Pacing Pulse Width: 0.4 ms
Lead Channel Setting Sensing Sensitivity: 2.5 mV
Pulse Gen Model: 2240
Pulse Gen Serial Number: 7779295

## 2020-07-18 NOTE — Progress Notes (Signed)
Remote pacemaker transmission.   

## 2020-08-04 ENCOUNTER — Other Ambulatory Visit: Payer: Self-pay | Admitting: Cardiovascular Disease

## 2020-08-04 ENCOUNTER — Telehealth: Payer: Self-pay | Admitting: Cardiovascular Disease

## 2020-08-04 NOTE — Telephone Encounter (Signed)
Please review for refill. Thanks!  

## 2020-08-04 NOTE — Telephone Encounter (Signed)
I have reviewed the chart though I see that it looks as Dr. Rockey Situ is the ordering provider of the Atorvastatin for the pt.

## 2020-08-04 NOTE — Telephone Encounter (Signed)
  Patient Consent for Virtual Visit         Catherine [name] has provided verbal consent on 08/04/2020 for a virtual visit (video or telephone).   CONSENT FOR VIRTUAL VISIT FOR:  Catherine [name]  By participating in this virtual visit I agree to the following:  I hereby voluntarily request, consent and authorize [facility] and its employed or contracted physicians, physician assistants, nurse practitioners or other licensed health care professionals (the Practitioner), to provide me with telemedicine health care services (the "Services") as deemed necessary by the treating Practitioner. I acknowledge and consent to receive the Services by the Practitioner via telemedicine. I understand that the telemedicine visit will involve communicating with the Practitioner through live audiovisual communication technology and the disclosure of certain medical information by electronic transmission. I acknowledge that I have been given the opportunity to request an in-person assessment or other available alternative prior to the telemedicine visit and am voluntarily participating in the telemedicine visit.  I understand that I have the right to withhold or withdraw my consent to the use of telemedicine in the course of my care at any time, without affecting my right to future care or treatment, and that the Practitioner or I may terminate the telemedicine visit at any time. I understand that I have the right to inspect all information obtained and/or recorded in the course of the telemedicine visit and may receive copies of available information for a reasonable fee.  I understand that some of the potential risks of receiving the Services via telemedicine include:  Marland Kitchen Delay or interruption in medical evaluation due to technological equipment failure or disruption; . Information transmitted may not be sufficient (e.g. poor resolution of images) to allow for appropriate medical decision making by the Practitioner;  and/or  . In rare instances, security protocols could fail, causing a breach of personal health information.  Furthermore, I acknowledge that it is my responsibility to provide information about my medical history, conditions and care that is complete and accurate to the best of my ability. I acknowledge that Practitioner's advice, recommendations, and/or decision may be based on factors not within their control, such as incomplete or inaccurate data provided by me or distortions of diagnostic images or specimens that may result from electronic transmissions. I understand that the practice of medicine is not an exact science and that Practitioner makes no warranties or guarantees regarding treatment outcomes. I acknowledge that a copy of this consent can be made available to me via my patient portal ([facility]), or I can request a printed copy by calling the office of [facility].    I understand that my insurance will be billed for this visit.   I have read or had this consent read to me. . I understand the contents of this consent, which adequately explains the benefits and risks of the Services being provided via telemedicine.  . I have been provided ample opportunity to ask questions regarding this consent and the Services and have had my questions answered to my satisfaction. . I give my informed consent for the services to be provided through the use of telemedicine in my medical care

## 2020-08-10 ENCOUNTER — Telehealth (INDEPENDENT_AMBULATORY_CARE_PROVIDER_SITE_OTHER): Payer: Medicare Other | Admitting: Cardiovascular Disease

## 2020-08-10 ENCOUNTER — Other Ambulatory Visit: Payer: Self-pay

## 2020-08-10 DIAGNOSIS — I25118 Atherosclerotic heart disease of native coronary artery with other forms of angina pectoris: Secondary | ICD-10-CM | POA: Diagnosis not present

## 2020-08-10 DIAGNOSIS — I442 Atrioventricular block, complete: Secondary | ICD-10-CM | POA: Diagnosis not present

## 2020-08-10 DIAGNOSIS — I350 Nonrheumatic aortic (valve) stenosis: Secondary | ICD-10-CM

## 2020-08-10 DIAGNOSIS — I5032 Chronic diastolic (congestive) heart failure: Secondary | ICD-10-CM

## 2020-08-10 MED ORDER — LOSARTAN POTASSIUM 50 MG PO TABS: 50.0000 mg | ORAL_TABLET | Freq: Every day | ORAL | 3 refills | Status: DC

## 2020-08-10 NOTE — Patient Instructions (Addendum)
Labs from PMD  Medication Instructions:  We will change losartan to 50 mg daily, needs refill She is already taking this, daughter does meds  If you need a refill on your cardiac medications before your next appointment, please call your pharmacy.    Lab work: No new labs needed   If you have labs (blood work) drawn today and your tests are completely normal, you will receive your results only by: Marland Kitchen MyChart Message (if you have MyChart) OR . A paper copy in the mail If you have any lab test that is abnormal or we need to change your treatment, we will call you to review the results.   Testing/Procedures: No new testing needed   Follow-Up: At [facility], you and your health needs are our priority.  As part of our continuing mission to provide you with exceptional heart care, we have created designated Provider Care Teams.  These Care Teams include your primary Cardiologist (physician) and Advanced Practice Providers (APPs -  Physician Assistants and Nurse Practitioners) who all work together to provide you with the care you need, when you need it.  . You will need a follow up appointment in 6 months in office  . Providers on your designated Care Team:   . Murray Hodgkins, NP . Christell Faith, PA-C . Marrianne Mood, PA-C  Any Other Special Instructions Will Be Listed Below (If Applicable).  COVID-19 Vaccine Information can be found at: [email or web address].uk For questions related to vaccine distribution or appointments, please email [email or web address] or call [phone].

## 2020-08-10 NOTE — Progress Notes (Signed)
Virtual Visit via Telephone Note   This visit type was conducted due to national recommendations for restrictions regarding the COVID-19 Pandemic (e.g. social distancing) in an effort to limit this patient's exposure and mitigate transmission in our community.  Due to her co-morbid illnesses, this patient is at least at moderate risk for complications without adequate follow up.  This format is felt to be most appropriate for this patient at this time.  The patient did not have access to video technology/had technical difficulties with video requiring transitioning to audio format only (telephone).  All issues noted in this document were discussed and addressed.  No physical exam could be performed with this format.  Please refer to the patient's chart for her  consent to telehealth for [facility].    Date:  08/10/2020   ID:  Catherine [name], DOB 1941/08/25, MRN [id number]  Patient Location:  [address] [place] [address]   Provider location:   [facility] [place] office  PCP:  Danelle Berry, NP  Cardiologist:  [facility]  No chief complaint on file.   History of Present Illness:    Catherine [name] is a 79 y.o. female who presents via audio/video conferencing for a telehealth visit today.   The patient does not symptoms concerning for COVID-19 infection (fever, chills, cough, or new SHORTNESS OF BREATH).   Patient has a past medical history of CAD Aortic stenosis s/p TAVR in 06/8468 complicated by complete heart block s/p PPM complicated byhemothorax requiring chest tube. Hypercholesteremia TIA CVA Chronic diastolic congestive heart failure Syncope Who presents for follow-up of her aortic valve stenosis, pacemaker, diastolic CHF   Last seen in clinic by telemetry with one of our providers February 2021 She reported leg swelling was stable Weight was down 12 pounds compared to prior office visit Continues to take Lasix 20 with potassium 10  daily She did not have follow-up with EP or structural heart at that time Reported having fatigue and weakness, chronic issue  Lots of shaking, followed by neurology Hands and arms,    admission to [facility], 3/13 to 03/30/20 Stopped taking Klonopin in 01/2020, she had run out Had reaction Followed by neurlogy  Uses a walker to get around  BP better,  With Dr. Melrose Nakayama in office 124/78,  Her BP cuff not working  No recent blood work available Done with PMD  No chest pain or SOB No change in swelling  She is taking losartan 50 daily   Other history reviewed polypharmacy contributing to her symptoms, in particular she was on multiple psychotropic/sedating medications.  Off lipitor, stopped on her own, "read bad stuff"   Prior CV studies:   The following studies were reviewed today:  cath in 08/2015 showed single vessel CAD with heavy calcification of the entire LAD and a moderate eccentric mid LAD stenosis estimated at 70%.  known severe aortic stenosis Medical management was advised for her CAD  TAVR in 05/2951 which was complicated by complete heart block requiring PPM. Her PPM implantation was complicated by a hemothorax requiring chest tube placement.   Echo from 03/2017 showed and EF of 60-65%, no RWMA, Gr1DD, normal functioning aortic valve replacement without stenosis or regurgitation.   Past Medical History:  Diagnosis Date  . Acute colitis   . Anemia   . CAD (coronary artery disease)    a. heavy calcification of the entire LAD & mod eccentric mid LAD stenosis, estimated @ 70%, mild nonobs stenosis  of RCA and LCx, severely calcified Ao valve with restricted valve mobility and 2+ AI    . [facility]) 1981   breast  . CHB (complete heart block) ([place]) 08/31/2015   [place] DR model [id number] (serial number [id number] )   . Chronic diastolic congestive heart failure ([place])    a. echo 08/31/2015: EF 65-70%, nl WM, GR1DD, Ao valve stent  bioprosthesis was present and functioning nl, no regurg, LA mildly dilated, PASP 46 mm Hg  . Collagen vascular disease ([place])   . Colon polyps   . Depression   . Diabetes mellitus type II   . Fibromyalgia   . HTN (hypertension)   . Hypercholesteremia   . Hypothyroidism   . IBS (irritable bowel syndrome)   . Rectal bleeding   . Rheumatoid arthritis(714.0)   . S/P TAVR (transcatheter aortic valve replacement) 08/30/2015   26 mm Edwards Sapien 3 transcatheter heart valve placed via open right transfemoral approach  . Shortness of breath dyspnea   . Stenosis of aortic valve   . Suicide attempt ([place])   . Thyroid disease    Past Surgical History:  Procedure Laterality Date  . ABDOMINAL SURGERY     Intestinal surgery  . CARDIAC SURGERY    . CESAREAN SECTION     x 2  . EP IMPLANTABLE DEVICE N/A 08/31/2015   Procedure: Pacemaker Implant;  Surgeon: Will Meredith Leeds, MD; [place] (serial number [id number] ); Laterality: Right  . LAPAROSCOPY N/A 01/31/2020   Procedure: LAPAROSCOPY DIAGNOSTIC;  Surgeon: Kieth Brightly Arta Bruce, MD;  Location: [place];  Service: General;  Laterality: N/A;  . LAPAROTOMY N/A 01/31/2020   Procedure: Exploratory Laparotomy;  Surgeon: Kieth Brightly Arta Bruce, MD;  Location: [place];  Service: General;  Laterality: N/A;  . LEFT OOPHORECTOMY    . LYSIS OF ADHESION  01/31/2020   Procedure: Lysis Of Adhesions, Ligation of Falciform Vein;  Surgeon: Kinsinger, Arta Bruce, MD;  Location: [place];  Service: General;;  . MASTECTOMY Left   . polyp     . self inflicted chest wound    . [place]   lower back  . TEE WITHOUT CARDIOVERSION N/A 08/30/2015   Procedure: TRANSESOPHAGEAL ECHOCARDIOGRAM (TEE);  Surgeon: Sherren Mocha, MD;  Location: [place];  Service: Open Heart Surgery;  Laterality: N/A;  . TOTAL ABDOMINAL HYSTERECTOMY    . TRANSCATHETER AORTIC VALVE REPLACEMENT, TRANSFEMORAL N/A 08/30/2015   Procedure: TRANSCATHETER AORTIC VALVE  REPLACEMENT, TRANSFEMORAL;  Surgeon: Sherren Mocha, MD;  Location: [place];  Service: Open Heart Surgery;  Laterality: N/A;     No outpatient medications have been marked as taking for the 08/10/20 encounter (Telemedicine) with Minna Merritts, MD.     Allergies:   Tetracyclines & related, Latex, Sulfa antibiotics, Sulfa antibiotics, and Latex   Social History   Tobacco Use  . Smoking status: Never Smoker  . Smokeless tobacco: Never Used  Vaping Use  . Vaping Use: Never used  Substance Use Topics  . Alcohol use: No    Alcohol/week: 0.0 standard drinks  . Drug use: No     Current Outpatient Medications on File Prior to Visit  Medication Sig Dispense Refill  . acetaminophen (TYLENOL) 500 MG tablet Take 1,000 mg by mouth every 6 (six) hours as needed for mild pain.    . Amino Acids-Protein Hydrolys (FEEDING SUPPLEMENT, PRO-STAT SUGAR FREE 64,) LIQD Take 30 mLs by mouth 3 (three) times daily. 887 mL 0  .  ARIPiprazole (ABILIFY) 10 MG tablet Take 1 tablet (10 mg total) by mouth daily. 30 tablet 1  . aspirin EC 81 MG tablet Take 81 mg by mouth daily.    Marland Kitchen atorvastatin (LIPITOR) 20 MG tablet TAKE 1 TABLET BY MOUTH  DAILY 90 tablet 0  . budesonide (PULMICORT) 0.5 MG/2ML nebulizer solution Take 2 mLs by nebulization 2 (two) times daily.    . clonazePAM (KLONOPIN) 0.5 MG tablet Take 1 tablet (0.5 mg total) by mouth at bedtime. 30 tablet 1  . docusate sodium (COLACE) 100 MG capsule Take 1 capsule (100 mg total) by mouth 2 (two) times daily. 10 capsule 0  . feeding supplement, ENSURE ENLIVE, (ENSURE ENLIVE) LIQD Take 237 mLs by mouth 3 (three) times daily between meals. 237 mL 12  . furosemide (LASIX) 20 MG tablet Take 20 mg by mouth every other day.     . hydrOXYzine (ATARAX/VISTARIL) 10 MG tablet Take 2-3 tab at bed time 60 tablet 1  . LANTUS SOLOSTAR 100 UNIT/ML Solostar Pen SMARTSIG:16 Unit(s) SUB-Q Every Night    . levothyroxine (SYNTHROID) 100 MCG tablet Take 100 mcg by mouth daily before  breakfast.    . losartan (COZAAR) 50 MG tablet Take 1 tablet (50 mg total) by mouth as directed. Take HALF tablet (25 mg) for one week then increase to whole tablet (50 mg) if BP remains high. 90 tablet 3  . mirabegron ER (MYRBETRIQ) 50 MG TB24 tablet Take 1 tablet (50 mg total) by mouth daily. 28 tablet 0  . mirtazapine (REMERON) 15 MG tablet Take 1 tablet (15 mg total) by mouth at bedtime. 30 tablet 1  . nitroGLYCERIN (NITROSTAT) 0.4 MG SL tablet PLACE 1 TABLET (0.4 MG TOTAL) UNDER THE TONGUE EVERY 5 (FIVE) MINUTES AS NEEDED FOR CHEST PAIN. 25 tablet 0  . pantoprazole (PROTONIX) 40 MG tablet Take 40 mg by mouth daily.    . polyethylene glycol (MIRALAX / GLYCOLAX) 17 g packet Take 17 g by mouth daily as needed for mild constipation.    . potassium chloride (KLOR-CON) 10 MEQ tablet TAKE 1 TABLET BY MOUTH EVERY DAY. TAKE AN EXTRA TABLET ON DAYS WHEN YOU TAKE AN EXTRA LASIX 60 tablet 0  . sertraline (ZOLOFT) 50 MG tablet Take 2.5 tablets (125 mg total) by mouth daily. 75 tablet 1   No current facility-administered medications on file prior to visit.     Family Hx: The patient's family history includes Depression in her sister, sister, and sister; Hypertension in her mother and sister; Lung cancer in her sister; Stroke in her sister. There is no history of Heart attack.  ROS:   Please see the history of present illness.    Review of Systems  Constitutional: Negative.   HENT: Negative.   Respiratory: Negative.   Cardiovascular: Negative.   Gastrointestinal: Negative.   Musculoskeletal: Negative.        Gait instability, weakness  Neurological: Negative.   Psychiatric/Behavioral: Negative.   All other systems reviewed and are negative.    Labs/Other Tests and Data Reviewed:    Recent Labs: 11/12/2019: B Natriuretic Peptide 205.0 01/31/2020: ALT 17 02/06/2020: Magnesium 2.1 02/09/2020: BUN 11; Hemoglobin 11.3; Platelets 455; Potassium 3.7; Sodium 137 02/14/2020: Creatinine, Ser 0.65    Recent Lipid Panel Lab Results  Component Value Date/Time   CHOL 186 03/10/2017 04:24 AM   TRIG 147 03/10/2017 04:24 AM   HDL 43 03/10/2017 04:24 AM   CHOLHDL 4.3 03/10/2017 04:24 AM   LDLCALC 114 (H) 03/10/2017 04:24  AM    Wt Readings from Last 3 Encounters:  01/31/20 185 lb (83.9 kg)  01/05/20 188 lb (85.3 kg)  12/12/19 197 lb 5 oz (89.5 kg)     Exam:    Vital Signs: Vital signs may also be detailed in the HPI There were no vitals taken for this visit.   well developed female in no acute distress. Constitutional:  oriented to person, place, and time. No distress.    ASSESSMENT & PLAN:    1. Chronic diastolic congestive heart failure (HCC) Euvolemic per her report Will get labs from PMD  2. Complete heart block ([place]) Has pacemaker Followed by EP  3. Hyperlipidemia, unspecified hyperlipidemia type Stopped on her own in the past  4. Hypertension, unspecified type We will clarify meds with daughter, Taking losartan 50 daily,   5. Aortic valve stenosis, etiology of cardiac valve disease unspecified S/p TAVR, Doing well Will need periodic echocardiogram  6.  Coronary artery disease with stable angina Denies further chest pain symptoms on follow-up call today  No further workup at this time. Continue current medication regimen.    COVID-19 Education: The signs and symptoms of COVID-19 were discussed with the patient and how to seek care for testing (follow up with PCP or arrange E-visit).  The importance of social distancing was discussed today.  Patient Risk:   After full review of this patients clinical status, I feel that they are at least moderate risk at this time.  Time:   Today, I have spent 25 minutes with the patient with telehealth technology discussing the cardiac and medical problems/diagnoses detailed above  .Discussed stable anginal symptoms, management of blood pressure    Medication Adjustments/Labs and Tests Ordered: Current  medicines are reviewed at length with the patient today.  Concerns regarding medicines are outlined above.   Tests Ordered: No tests ordered   Medication Changes: Detailed above  Disposition: Follow-up in 6 months   Signed, Catherine Rogue, MD  08/10/2020 9:00 AM    [place] Office [address] [place] [address], [place], [place] [address]

## 2020-08-11 ENCOUNTER — Other Ambulatory Visit: Payer: Self-pay

## 2020-08-11 ENCOUNTER — Encounter (HOSPITAL_COMMUNITY): Payer: Self-pay | Admitting: Psychiatry

## 2020-08-11 ENCOUNTER — Telehealth (INDEPENDENT_AMBULATORY_CARE_PROVIDER_SITE_OTHER): Payer: Medicare Other | Admitting: Psychiatry

## 2020-08-11 DIAGNOSIS — F419 Anxiety disorder, unspecified: Secondary | ICD-10-CM | POA: Diagnosis not present

## 2020-08-11 DIAGNOSIS — F331 Major depressive disorder, recurrent, moderate: Secondary | ICD-10-CM

## 2020-08-11 MED ORDER — SERTRALINE HCL 50 MG PO TABS: 125.0000 mg | ORAL_TABLET | Freq: Every day | ORAL | 2 refills | Status: DC

## 2020-08-11 MED ORDER — MIRTAZAPINE 15 MG PO TABS: 15.0000 mg | ORAL_TABLET | Freq: Every day | ORAL | 2 refills | Status: DC

## 2020-08-11 MED ORDER — CLONAZEPAM 0.5 MG PO TABS: 0.5000 mg | ORAL_TABLET | Freq: Every day | ORAL | 2 refills | Status: DC
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
## 2020-09-22 ENCOUNTER — Other Ambulatory Visit (HOSPITAL_COMMUNITY): Payer: Self-pay | Admitting: Psychiatry

## 2020-09-22 DIAGNOSIS — F331 Major depressive disorder, recurrent, moderate: Secondary | ICD-10-CM

## 2020-10-13 ENCOUNTER — Ambulatory Visit (INDEPENDENT_AMBULATORY_CARE_PROVIDER_SITE_OTHER): Payer: Medicare Other

## 2020-10-13 DIAGNOSIS — I442 Atrioventricular block, complete: Secondary | ICD-10-CM | POA: Diagnosis not present

## 2020-10-14 LAB — CUP PACEART REMOTE DEVICE CHECK
Battery Remaining Longevity: 101 mo
Battery Remaining Percentage: 89 %
Battery Voltage: 2.95 V
Brady Statistic AP VP Percent: 2.4 %
Brady Statistic AP VS Percent: 3.8 %
Brady Statistic AS VP Percent: 44 %
Brady Statistic AS VS Percent: 49 %
Brady Statistic RA Percent Paced: 6 %
Brady Statistic RV Percent Paced: 47 %
Date Time Interrogation Session: 20211112005307
Implantable Lead Implant Date: 20160928
Implantable Lead Implant Date: 20160928
Implantable Lead Location: 753859
Implantable Lead Location: 753860
Implantable Pulse Generator Implant Date: 20160928
Lead Channel Impedance Value: 360 Ohm
Lead Channel Impedance Value: 490 Ohm
Lead Channel Pacing Threshold Amplitude: 0.75 V
Lead Channel Pacing Threshold Amplitude: 0.75 V
Lead Channel Pacing Threshold Pulse Width: 0.4 ms
Lead Channel Pacing Threshold Pulse Width: 0.4 ms
Lead Channel Sensing Intrinsic Amplitude: 3.1 mV
Lead Channel Sensing Intrinsic Amplitude: 5.5 mV
Lead Channel Setting Pacing Amplitude: 2 V
Lead Channel Setting Pacing Amplitude: 2.5 V
Lead Channel Setting Pacing Pulse Width: 0.4 ms
Lead Channel Setting Sensing Sensitivity: 2.5 mV
Pulse Gen Model: 2240
Pulse Gen Serial Number: 7779295

## 2020-10-17 NOTE — Progress Notes (Signed)
Remote pacemaker transmission.   

## 2020-11-04 DIAGNOSIS — K648 Other hemorrhoids: Secondary | ICD-10-CM | POA: Insufficient documentation

## 2020-11-10 ENCOUNTER — Encounter (HOSPITAL_COMMUNITY): Payer: Self-pay | Admitting: Psychiatry

## 2020-11-10 ENCOUNTER — Telehealth (INDEPENDENT_AMBULATORY_CARE_PROVIDER_SITE_OTHER): Payer: Medicare Other | Admitting: Psychiatry

## 2020-11-10 ENCOUNTER — Other Ambulatory Visit: Payer: Self-pay

## 2020-11-10 DIAGNOSIS — F331 Major depressive disorder, recurrent, moderate: Secondary | ICD-10-CM | POA: Diagnosis not present

## 2020-11-10 DIAGNOSIS — F419 Anxiety disorder, unspecified: Secondary | ICD-10-CM

## 2020-11-10 MED ORDER — SERTRALINE HCL 50 MG PO TABS: 125.0000 mg | ORAL_TABLET | Freq: Every day | ORAL | 0 refills | Status: DC

## 2020-11-10 MED ORDER — CLONAZEPAM 0.5 MG PO TABS: 0.5000 mg | ORAL_TABLET | Freq: Every day | ORAL | 2 refills | Status: DC

## 2020-11-10 MED ORDER — MIRTAZAPINE 15 MG PO TABS: 15.0000 mg | ORAL_TABLET | Freq: Every day | ORAL | 0 refills | Status: DC
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
## 2020-11-29 ENCOUNTER — Other Ambulatory Visit: Payer: Self-pay

## 2020-11-29 ENCOUNTER — Encounter: Payer: Self-pay | Admitting: Radiology

## 2020-11-29 ENCOUNTER — Emergency Department
Admission: EM | Admit: 2020-11-29 | Discharge: 2020-11-29 | Disposition: A | Discharge status: Home / Self Care | Payer: Medicare Other | Attending: Emergency Medicine | Admitting: Emergency Medicine

## 2020-11-29 ENCOUNTER — Emergency Department: Payer: Medicare Other

## 2020-11-29 DIAGNOSIS — I2510 Atherosclerotic heart disease of native coronary artery without angina pectoris: Secondary | ICD-10-CM | POA: Diagnosis not present

## 2020-11-29 DIAGNOSIS — Z7982 Long term (current) use of aspirin: Secondary | ICD-10-CM | POA: Insufficient documentation

## 2020-11-29 DIAGNOSIS — K5900 Constipation, unspecified: Secondary | ICD-10-CM | POA: Insufficient documentation

## 2020-11-29 DIAGNOSIS — I110 Hypertensive heart disease with heart failure: Secondary | ICD-10-CM | POA: Insufficient documentation

## 2020-11-29 DIAGNOSIS — I5032 Chronic diastolic (congestive) heart failure: Secondary | ICD-10-CM | POA: Diagnosis not present

## 2020-11-29 DIAGNOSIS — R109 Unspecified abdominal pain: Secondary | ICD-10-CM | POA: Diagnosis present

## 2020-11-29 DIAGNOSIS — R110 Nausea: Secondary | ICD-10-CM | POA: Diagnosis not present

## 2020-11-29 DIAGNOSIS — E039 Hypothyroidism, unspecified: Secondary | ICD-10-CM | POA: Diagnosis not present

## 2020-11-29 DIAGNOSIS — E119 Type 2 diabetes mellitus without complications: Secondary | ICD-10-CM | POA: Insufficient documentation

## 2020-11-29 DIAGNOSIS — Z91040 Latex allergy status: Secondary | ICD-10-CM | POA: Diagnosis not present

## 2020-11-29 DIAGNOSIS — Z79899 Other long term (current) drug therapy: Secondary | ICD-10-CM | POA: Diagnosis not present

## 2020-11-29 DIAGNOSIS — R1084 Generalized abdominal pain: Secondary | ICD-10-CM | POA: Insufficient documentation

## 2020-11-29 DIAGNOSIS — Z853 Personal history of malignant neoplasm of breast: Secondary | ICD-10-CM | POA: Diagnosis not present

## 2020-11-29 LAB — COMPREHENSIVE METABOLIC PANEL
ALT: 12 U/L (ref 0–44)
AST: 20 U/L (ref 15–41)
Albumin: 3.9 g/dL (ref 3.5–5.0)
Alkaline Phosphatase: 67 U/L (ref 38–126)
Anion gap: 9 (ref 5–15)
BUN: 20 mg/dL (ref 8–23)
CO2: 26 mmol/L (ref 22–32)
Calcium: 9.4 mg/dL (ref 8.9–10.3)
Chloride: 102 mmol/L (ref 98–111)
Creatinine, Ser: 0.82 mg/dL (ref 0.44–1.00)
GFR, Estimated: >60 mL/min (ref >60)
Glucose, Bld: 94 mg/dL (ref 70–99)
Potassium: 4.1 mmol/L (ref 3.5–5.1)
Sodium: 137 mmol/L (ref 135–145)
Total Bilirubin: 0.6 mg/dL (ref 0.3–1.2)
Total Protein: 7.3 g/dL (ref 6.5–8.1)

## 2020-11-29 LAB — URINALYSIS, COMPLETE (UACMP) WITH MICROSCOPIC
Bacteria, UA: NONE SEEN
Bilirubin Urine: NEGATIVE
Glucose, UA: NEGATIVE mg/dL
Hgb urine dipstick: NEGATIVE
Ketones, ur: NEGATIVE mg/dL
Leukocytes,Ua: NEGATIVE
Nitrite: NEGATIVE
Protein, ur: NEGATIVE mg/dL
Specific Gravity, Urine: 1.024 (ref 1.005–1.030)
pH: 5 (ref 5.0–8.0)

## 2020-11-29 LAB — CBC
HCT: 36.4 % (ref 36.0–46.0)
Hemoglobin: 11.5 g/dL — ABNORMAL LOW (ref 12.0–15.0)
MCH: 27.3 pg (ref 26.0–34.0)
MCHC: 31.6 g/dL (ref 30.0–36.0)
MCV: 86.5 fL (ref 80.0–100.0)
Platelets: 307 10*3/uL (ref 150–400)
RBC: 4.21 MIL/uL (ref 3.87–5.11)
RDW: 14.6 % (ref 11.5–15.5)
WBC: 9 10*3/uL (ref 4.0–10.5)
nRBC: 0.2 % (ref 0.0–0.2)

## 2020-11-29 LAB — LIPASE, BLOOD: Lipase: 25 U/L (ref 11–51)

## 2020-11-29 LAB — TROPONIN I (HIGH SENSITIVITY): Troponin I (High Sensitivity): 7 ng/L (ref <18)

## 2020-11-29 MED ORDER — ACETAMINOPHEN 500 MG PO TABS
ORAL_TABLET | ORAL | Status: AC
  Administered 2020-11-29: 23:00:00 1000 mg via ORAL
  Filled 2020-11-29: qty 2

## 2020-11-29 MED ORDER — DOCUSATE SODIUM 100 MG PO CAPS: 100.0000 mg | ORAL_CAPSULE | Freq: Two times a day (BID) | ORAL | 2 refills | Status: DC

## 2020-11-29 MED ORDER — ONDANSETRON HCL 4 MG/2ML IJ SOLN
4.0000 mg | Freq: Once | INTRAMUSCULAR | Status: AC
  Administered 2020-11-29: 18:00:00 4 mg via INTRAVENOUS
  Filled 2020-11-29: qty 2

## 2020-11-29 MED ORDER — IOHEXOL 300 MG/ML  SOLN
100.0000 mL | Freq: Once | INTRAMUSCULAR | Status: AC | PRN
  Administered 2020-11-29: 19:00:00 100 mL via INTRAVENOUS

## 2020-11-29 MED ORDER — MORPHINE SULFATE (PF) 4 MG/ML IV SOLN
4.0000 mg | Freq: Once | INTRAVENOUS | Status: AC
Start: 2020-11-29 — End: 2020-11-29
  Administered 2020-11-29: 18:00:00 4 mg via INTRAVENOUS
  Filled 2020-11-29: qty 1

## 2020-11-29 MED ORDER — ACETAMINOPHEN 500 MG PO TABS: 1000.0000 mg | ORAL_TABLET | Freq: Once | ORAL | Status: AC

## 2020-11-29 MED ORDER — LACTATED RINGERS IV BOLUS
1000.0000 mL | Freq: Once | INTRAVENOUS | Status: AC
  Administered 2020-11-29: 18:00:00 1000 mL via INTRAVENOUS

## 2020-11-29 NOTE — ED Triage Notes (Signed)
Pt states that she has been having problems with nausea, constipation and bleeding from her hemorrhoids for the past couple weeks, pt also mentions chest pains for the past couple weeks with a hx of heart surg in the past as well as a pacemaker

## 2020-11-29 NOTE — Discharge Instructions (Signed)
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
## 2021-01-11 ENCOUNTER — Other Ambulatory Visit: Payer: Self-pay | Admitting: Physician Assistant

## 2021-01-12 ENCOUNTER — Other Ambulatory Visit (HOSPITAL_COMMUNITY): Payer: Self-pay | Admitting: Psychiatry

## 2021-01-12 ENCOUNTER — Ambulatory Visit (INDEPENDENT_AMBULATORY_CARE_PROVIDER_SITE_OTHER): Payer: Medicare Other

## 2021-01-12 DIAGNOSIS — F331 Major depressive disorder, recurrent, moderate: Secondary | ICD-10-CM

## 2021-01-12 DIAGNOSIS — I442 Atrioventricular block, complete: Secondary | ICD-10-CM | POA: Diagnosis not present

## 2021-01-13 LAB — CUP PACEART REMOTE DEVICE CHECK
Battery Remaining Longevity: 112 mo
Battery Remaining Percentage: 95.5 %
Battery Voltage: 2.96 V
Brady Statistic AP VP Percent: 2.4 %
Brady Statistic AP VS Percent: 3.7 %
Brady Statistic AS VP Percent: 47 %
Brady Statistic AS VS Percent: 47 %
Brady Statistic RA Percent Paced: 5.9 %
Brady Statistic RV Percent Paced: 49 %
Date Time Interrogation Session: 20220210020026
Implantable Lead Implant Date: 20160928
Implantable Lead Implant Date: 20160928
Implantable Lead Location: 753859
Implantable Lead Location: 753860
Implantable Pulse Generator Implant Date: 20160928
Lead Channel Impedance Value: 390 Ohm
Lead Channel Impedance Value: 550 Ohm
Lead Channel Pacing Threshold Amplitude: 0.75 V
Lead Channel Pacing Threshold Amplitude: 0.75 V
Lead Channel Pacing Threshold Pulse Width: 0.4 ms
Lead Channel Pacing Threshold Pulse Width: 0.4 ms
Lead Channel Sensing Intrinsic Amplitude: 3.7 mV
Lead Channel Sensing Intrinsic Amplitude: 4.6 mV
Lead Channel Setting Pacing Amplitude: 2 V
Lead Channel Setting Pacing Amplitude: 2.5 V
Lead Channel Setting Pacing Pulse Width: 0.4 ms
Lead Channel Setting Sensing Sensitivity: 2.5 mV
Pulse Gen Model: 2240
Pulse Gen Serial Number: 7779295

## 2021-01-16 ENCOUNTER — Encounter: Payer: Self-pay | Admitting: Emergency Medicine

## 2021-01-16 ENCOUNTER — Emergency Department
Admission: EM | Admit: 2021-01-16 | Discharge: 2021-01-16 | Disposition: A | Discharge status: Home / Self Care | Payer: Medicare Other | Attending: Emergency Medicine | Admitting: Emergency Medicine

## 2021-01-16 ENCOUNTER — Other Ambulatory Visit: Payer: Self-pay

## 2021-01-16 ENCOUNTER — Emergency Department: Payer: Medicare Other

## 2021-01-16 DIAGNOSIS — R197 Diarrhea, unspecified: Secondary | ICD-10-CM | POA: Diagnosis not present

## 2021-01-16 DIAGNOSIS — R509 Fever, unspecified: Secondary | ICD-10-CM | POA: Diagnosis not present

## 2021-01-16 DIAGNOSIS — R110 Nausea: Secondary | ICD-10-CM | POA: Insufficient documentation

## 2021-01-16 DIAGNOSIS — E119 Type 2 diabetes mellitus without complications: Secondary | ICD-10-CM | POA: Diagnosis not present

## 2021-01-16 DIAGNOSIS — R5383 Other fatigue: Secondary | ICD-10-CM | POA: Insufficient documentation

## 2021-01-16 DIAGNOSIS — R053 Chronic cough: Secondary | ICD-10-CM | POA: Insufficient documentation

## 2021-01-16 DIAGNOSIS — I2510 Atherosclerotic heart disease of native coronary artery without angina pectoris: Secondary | ICD-10-CM | POA: Insufficient documentation

## 2021-01-16 DIAGNOSIS — Z7982 Long term (current) use of aspirin: Secondary | ICD-10-CM | POA: Insufficient documentation

## 2021-01-16 DIAGNOSIS — I110 Hypertensive heart disease with heart failure: Secondary | ICD-10-CM | POA: Insufficient documentation

## 2021-01-16 DIAGNOSIS — Z853 Personal history of malignant neoplasm of breast: Secondary | ICD-10-CM | POA: Diagnosis not present

## 2021-01-16 DIAGNOSIS — I5032 Chronic diastolic (congestive) heart failure: Secondary | ICD-10-CM | POA: Diagnosis not present

## 2021-01-16 DIAGNOSIS — Z20822 Contact with and (suspected) exposure to covid-19: Secondary | ICD-10-CM | POA: Insufficient documentation

## 2021-01-16 DIAGNOSIS — Z79899 Other long term (current) drug therapy: Secondary | ICD-10-CM | POA: Diagnosis not present

## 2021-01-16 DIAGNOSIS — E039 Hypothyroidism, unspecified: Secondary | ICD-10-CM | POA: Insufficient documentation

## 2021-01-16 DIAGNOSIS — Z794 Long term (current) use of insulin: Secondary | ICD-10-CM | POA: Insufficient documentation

## 2021-01-16 DIAGNOSIS — Z91040 Latex allergy status: Secondary | ICD-10-CM | POA: Diagnosis not present

## 2021-01-16 DIAGNOSIS — R531 Weakness: Secondary | ICD-10-CM | POA: Diagnosis not present

## 2021-01-16 LAB — RESP PANEL BY RT-PCR (FLU A&B, COVID) ARPGX2
Influenza A by PCR: NEGATIVE
Influenza B by PCR: NEGATIVE
SARS Coronavirus 2 by RT PCR: NEGATIVE

## 2021-01-16 LAB — BASIC METABOLIC PANEL
Anion gap: 12 (ref 5–15)
BUN: 21 mg/dL (ref 8–23)
CO2: 24 mmol/L (ref 22–32)
Calcium: 9.7 mg/dL (ref 8.9–10.3)
Chloride: 100 mmol/L (ref 98–111)
Creatinine, Ser: 0.86 mg/dL (ref 0.44–1.00)
GFR, Estimated: >60 mL/min (ref >60)
Glucose, Bld: 117 mg/dL — ABNORMAL HIGH (ref 70–99)
Potassium: 4.4 mmol/L (ref 3.5–5.1)
Sodium: 136 mmol/L (ref 135–145)

## 2021-01-16 LAB — CBC
HCT: 38.2 % (ref 36.0–46.0)
Hemoglobin: 12.3 g/dL (ref 12.0–15.0)
MCH: 27.2 pg (ref 26.0–34.0)
MCHC: 32.2 g/dL (ref 30.0–36.0)
MCV: 84.5 fL (ref 80.0–100.0)
Platelets: 349 10*3/uL (ref 150–400)
RBC: 4.52 MIL/uL (ref 3.87–5.11)
RDW: 14 % (ref 11.5–15.5)
WBC: 11.2 10*3/uL — ABNORMAL HIGH (ref 4.0–10.5)
nRBC: 0 % (ref 0.0–0.2)

## 2021-01-16 LAB — URINALYSIS, COMPLETE (UACMP) WITH MICROSCOPIC
Bilirubin Urine: NEGATIVE
Glucose, UA: NEGATIVE mg/dL
Hgb urine dipstick: NEGATIVE
Ketones, ur: NEGATIVE mg/dL
Nitrite: NEGATIVE
Protein, ur: NEGATIVE mg/dL
Specific Gravity, Urine: 1.011 (ref 1.005–1.030)
pH: 8 (ref 5.0–8.0)

## 2021-01-16 LAB — HEPATIC FUNCTION PANEL
ALT: 13 U/L (ref 0–44)
AST: 25 U/L (ref 15–41)
Albumin: 3.9 g/dL (ref 3.5–5.0)
Alkaline Phosphatase: 70 U/L (ref 38–126)
Bilirubin, Direct: <0.1 mg/dL (ref 0.0–0.2)
Total Bilirubin: 0.6 mg/dL (ref 0.3–1.2)
Total Protein: 7.3 g/dL (ref 6.5–8.1)

## 2021-01-16 LAB — TROPONIN I (HIGH SENSITIVITY): Troponin I (High Sensitivity): 7 ng/L (ref <18)

## 2021-01-16 LAB — LIPASE, BLOOD: Lipase: 31 U/L (ref 11–51)

## 2021-01-16 MED ORDER — POLYETHYLENE GLYCOL 3350 17 GM/SCOOP PO POWD: 17.0000 g | Freq: Two times a day (BID) | ORAL | 0 refills | Status: AC | PRN

## 2021-01-16 MED ORDER — SODIUM CHLORIDE 0.9 % IV BOLUS
1000.0000 mL | Freq: Once | INTRAVENOUS | Status: AC
  Administered 2021-01-16: 1000 mL via INTRAVENOUS

## 2021-01-16 MED ORDER — IOHEXOL 300 MG/ML  SOLN
100.0000 mL | Freq: Once | INTRAMUSCULAR | Status: AC | PRN
  Administered 2021-01-16: 100 mL via INTRAVENOUS

## 2021-01-16 MED ORDER — DOCUSATE SODIUM 100 MG PO CAPS: 100.0000 mg | ORAL_CAPSULE | Freq: Two times a day (BID) | ORAL | 0 refills | Status: AC

## 2021-01-16 NOTE — ED Notes (Signed)
Patient updated on plan of care. Patient awaiting discharge home. Daughter to be picking patient up.

## 2021-01-16 NOTE — ED Notes (Signed)
Patient assisted to the bathroom at this time. Patient ambulatory with standby assistance to bathroom from hall bed. Patient returned to hall bed. Patient requesting pain medication. Dr. Kerman Passey notified. Awaiting new orders.

## 2021-01-16 NOTE — ED Notes (Signed)
Patient transported to CT 

## 2021-01-16 NOTE — ED Triage Notes (Signed)
C/O dizziness and feeling like she is just about to pass out.  Patent is AAOx3.  Skin warm and dry. NAD.  No SOB/ DOE.  NAD
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
## 2021-01-16 NOTE — ED Notes (Signed)
Patient and daughter educated on discharge instructions. Prescriptions provided. Daughter to take patient home via POV. Patient respirations even and unlabored. Patient alert, oriented x4. NADN.

## 2021-01-16 NOTE — ED Notes (Signed)
Patient resting in hall bed. Respirations even and unlabored. Patient alert, oriented x4.

## 2021-01-16 NOTE — ED Notes (Signed)
Patient returned from CT to ED at this time. Patient is resting comfortably.

## 2021-01-16 NOTE — ED Triage Notes (Signed)
Pt in w/dizziness x 3 days, and persistent nausea x same. Denies any emesis or cp. Does have CHF, DM and pacemaker hx. States CBG's have been stable lately. Feels faint when standing

## 2021-01-18 ENCOUNTER — Other Ambulatory Visit (HOSPITAL_COMMUNITY): Payer: Self-pay | Admitting: Psychiatry

## 2021-01-18 DIAGNOSIS — F331 Major depressive disorder, recurrent, moderate: Secondary | ICD-10-CM

## 2021-01-18 LAB — URINE CULTURE: Culture: 30000 — AB

## 2021-01-18 NOTE — Progress Notes (Signed)
Remote pacemaker transmission.   

## 2021-02-06 ENCOUNTER — Other Ambulatory Visit: Payer: Self-pay

## 2021-02-06 ENCOUNTER — Encounter (HOSPITAL_COMMUNITY): Payer: Self-pay | Admitting: Psychiatry

## 2021-02-06 ENCOUNTER — Telehealth (INDEPENDENT_AMBULATORY_CARE_PROVIDER_SITE_OTHER): Payer: Medicare Other | Admitting: Psychiatry

## 2021-02-06 VITALS — Wt 163.0 lb

## 2021-02-06 DIAGNOSIS — F09 Unspecified mental disorder due to known physiological condition: Secondary | ICD-10-CM

## 2021-02-06 DIAGNOSIS — F419 Anxiety disorder, unspecified: Secondary | ICD-10-CM | POA: Diagnosis not present

## 2021-02-06 DIAGNOSIS — F331 Major depressive disorder, recurrent, moderate: Secondary | ICD-10-CM

## 2021-02-06 MED ORDER — MIRTAZAPINE 15 MG PO TABS
15.0000 mg | ORAL_TABLET | Freq: Every day | ORAL | 0 refills | Status: DC
Start: 2021-02-06 — End: 2021-05-17

## 2021-02-06 MED ORDER — SERTRALINE HCL 50 MG PO TABS
125.0000 mg | ORAL_TABLET | Freq: Every day | ORAL | 0 refills | Status: DC
Start: 2021-02-06 — End: 2021-05-17

## 2021-02-06 MED ORDER — CLONAZEPAM 0.5 MG PO TABS: 0.5000 mg | ORAL_TABLET | Freq: Every day | ORAL | 2 refills | Status: DC
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
## 2021-03-10 DIAGNOSIS — R5383 Other fatigue: Secondary | ICD-10-CM | POA: Diagnosis not present

## 2021-03-10 DIAGNOSIS — E119 Type 2 diabetes mellitus without complications: Secondary | ICD-10-CM | POA: Diagnosis not present

## 2021-03-10 DIAGNOSIS — E559 Vitamin D deficiency, unspecified: Secondary | ICD-10-CM | POA: Diagnosis not present

## 2021-03-10 DIAGNOSIS — E039 Hypothyroidism, unspecified: Secondary | ICD-10-CM | POA: Diagnosis not present

## 2021-03-10 DIAGNOSIS — I10 Essential (primary) hypertension: Secondary | ICD-10-CM | POA: Diagnosis not present

## 2021-03-14 DIAGNOSIS — R2681 Unsteadiness on feet: Secondary | ICD-10-CM | POA: Diagnosis not present

## 2021-03-14 DIAGNOSIS — R251 Tremor, unspecified: Secondary | ICD-10-CM | POA: Diagnosis not present

## 2021-03-14 DIAGNOSIS — G479 Sleep disorder, unspecified: Secondary | ICD-10-CM | POA: Diagnosis not present

## 2021-03-16 NOTE — Progress Notes (Signed)
Date:  03/17/2021   ID:  Catherine [name], DOB 10/17/1941, MRN [id number]  Patient Location:  [address] [place] [address]   Provider location:   [facility], [place] office  PCP:  Danelle Berry, NP  Cardiologist:  Patsy Baltimore  Chief Complaint  Patient presents with  . Follow-up    6 month F/U-No new cardiac concerns    History of Present Illness:    Catherine [name] is a 80 y.o. female  past medical history of CAD Aortic stenosis s/p TAVR in 01/7740 complicated by complete heart block s/p PPM complicated byhemothorax requiring chest tube. Hypercholesteremia TIA CVA Chronic diastolic congestive heart failure Syncope Who presents for follow-up of her aortic valve stenosis, pacemaker, diastolic CHF  Last seen in clinic by telemetry 08/2020 In follow-up today reports that she is having problems with chronic nausea She has tried numerous medications, most recently Zofran with no improvement of her symptoms Symptoms worse after eating Gets bloated Reports that she is scheduled to see GI  Breathing stable, denies significant leg swelling Chronic back pain Using walker to get around  She has indicated she would like to get her pacemaker checked in [place] to avoid the commute to [facility]  EKG personally reviewed by myself on todays visit Shows paced rhythm rate 62 bpm  Other history reviewed polypharmacy contributing to her symptoms, in particular she was on multiple psychotropic/sedating medications.  Off lipitor, stopped on her own, "read bad stuff"   Prior CV studies:   The following studies were reviewed today:  cath in 08/2015 showed single vessel CAD with heavy calcification of the entire LAD and a moderate eccentric mid LAD stenosis estimated at 70%.  known severe aortic stenosis Medical management was advised for her CAD  TAVR in 01/8785 which was complicated by complete heart block requiring PPM. Her PPM  implantation was complicated by a hemothorax requiring chest tube placement.   Echo from 03/2017 showed and EF of 60-65%, no RWMA, Gr1DD, normal functioning aortic valve replacement without stenosis or regurgitation.   Past Medical History:  Diagnosis Date  . Acute colitis   . Anemia   . CAD (coronary artery disease)    a. heavy calcification of the entire LAD & mod eccentric mid LAD stenosis, estimated @ 70%, mild nonobs stenosis of RCA and LCx, severely calcified Ao valve with restricted valve mobility and 2+ AI    . Cancer [facility]) 1981   breast  . CHB (complete heart block) ([place]) 08/31/2015   [place] DR model [id number] (serial number [id number] )   . Chronic diastolic congestive heart failure ([place])    a. echo 08/31/2015: EF 65-70%, nl WM, GR1DD, Ao valve stent bioprosthesis was present and functioning nl, no regurg, LA mildly dilated, PASP 46 mm Hg  . Collagen vascular disease ([place])   . Colon polyps   . Depression   . Diabetes mellitus type II   . Fibromyalgia   . HTN (hypertension)   . Hypercholesteremia   . Hypothyroidism   . IBS (irritable bowel syndrome)   . Rectal bleeding   . Rheumatoid arthritis(714.0)   . S/P TAVR (transcatheter aortic valve replacement) 08/30/2015   26 mm Edwards Sapien 3 transcatheter heart valve placed via open right transfemoral approach  . Shortness of breath dyspnea   . Stenosis of aortic valve   . Suicide attempt ([place])   . Thyroid disease    Past Surgical History:  Procedure  Laterality Date  . ABDOMINAL SURGERY     Intestinal surgery  . CARDIAC SURGERY    . CESAREAN SECTION     x 2  . EP IMPLANTABLE DEVICE N/A 08/31/2015   Procedure: Pacemaker Implant;  Surgeon: Will Meredith Leeds, MD; [place] (serial number [id number] ); Laterality: Right  . LAPAROSCOPY N/A 01/31/2020   Procedure: LAPAROSCOPY DIAGNOSTIC;  Surgeon: Kieth Brightly Arta Bruce, MD;  Location: [place];  Service: General;  Laterality: N/A;  .  LAPAROTOMY N/A 01/31/2020   Procedure: Exploratory Laparotomy;  Surgeon: Kieth Brightly Arta Bruce, MD;  Location: [place];  Service: General;  Laterality: N/A;  . LEFT OOPHORECTOMY    . LYSIS OF ADHESION  01/31/2020   Procedure: Lysis Of Adhesions, Ligation of Falciform Vein;  Surgeon: Kinsinger, Arta Bruce, MD;  Location: [place];  Service: General;;  . MASTECTOMY Left   . polyp     . self inflicted chest wound    . [place]   lower back  . TEE WITHOUT CARDIOVERSION N/A 08/30/2015   Procedure: TRANSESOPHAGEAL ECHOCARDIOGRAM (TEE);  Surgeon: Sherren Mocha, MD;  Location: [place];  Service: Open Heart Surgery;  Laterality: N/A;  . TOTAL ABDOMINAL HYSTERECTOMY    . TRANSCATHETER AORTIC VALVE REPLACEMENT, TRANSFEMORAL N/A 08/30/2015   Procedure: TRANSCATHETER AORTIC VALVE REPLACEMENT, TRANSFEMORAL;  Surgeon: Sherren Mocha, MD;  Location: [place];  Service: Open Heart Surgery;  Laterality: N/A;     Current Meds  Medication Sig  . acetaminophen (TYLENOL) 500 MG tablet Take 1,000 mg by mouth every 6 (six) hours as needed for mild pain.  . Amino Acids-Protein Hydrolys (FEEDING SUPPLEMENT, PRO-STAT SUGAR FREE 64,) LIQD Take 30 mLs by mouth 3 (three) times daily.  Marland Kitchen aspirin EC 81 MG tablet Take 81 mg by mouth daily.  Marland Kitchen atorvastatin (LIPITOR) 20 MG tablet TAKE 1 TABLET BY MOUTH  DAILY  . benztropine (COGENTIN) 1 MG tablet Take 1 mg by mouth 2 (two) times daily.  . budesonide (PULMICORT) 0.5 MG/2ML nebulizer solution Take 2 mLs by nebulization 2 (two) times daily.  . clonazePAM (KLONOPIN) 0.5 MG tablet Take 1 tablet (0.5 mg total) by mouth at bedtime.  . docusate sodium (COLACE) 100 MG capsule Take 1 capsule (100 mg total) by mouth 2 (two) times daily.  . feeding supplement, ENSURE ENLIVE, (ENSURE ENLIVE) LIQD Take 237 mLs by mouth 3 (three) times daily between meals.  . furosemide (LASIX) 20 MG tablet Take 20 mg by mouth every other day.   Marland Kitchen LANTUS SOLOSTAR 100 UNIT/ML Solostar Pen SMARTSIG:16  Unit(s) SUB-Q Every Night  . levothyroxine (SYNTHROID) 100 MCG tablet Take 100 mcg by mouth daily before breakfast.  . mirabegron ER (MYRBETRIQ) 50 MG TB24 tablet Take 1 tablet (50 mg total) by mouth daily.  . mirtazapine (REMERON) 15 MG tablet Take 1 tablet (15 mg total) by mouth at bedtime.  . nitroGLYCERIN (NITROSTAT) 0.4 MG SL tablet PLACE 1 TABLET (0.4 MG TOTAL) UNDER THE TONGUE EVERY 5 (FIVE) MINUTES AS NEEDED FOR CHEST PAIN.  Marland Kitchen pantoprazole (PROTONIX) 40 MG tablet Take 40 mg by mouth daily.  . potassium chloride (KLOR-CON) 10 MEQ tablet TAKE 1 TABLET BY MOUTH EVERY OTHER DAY  . sertraline (ZOLOFT) 50 MG tablet Take 2.5 tablets (125 mg total) by mouth daily.  . traZODone (DESYREL) 50 MG tablet Take trazodone 50 mg as needed, take 1-2 tabs at night     Allergies:   Tetracyclines & related, Latex, Sulfa antibiotics, and Latex  Social History   Tobacco Use  . Smoking status: Never Smoker  . Smokeless tobacco: Never Used  Vaping Use  . Vaping Use: Never used  Substance Use Topics  . Alcohol use: No    Alcohol/week: 0.0 standard drinks  . Drug use: No     Family Hx: The patient's family history includes Depression in her sister, sister, and sister; Hypertension in her mother and sister; Lung cancer in her sister; Pneumonia in her brother; Stroke in her sister. There is no history of Heart attack.  ROS:   Please see the history of present illness.    Review of Systems  Constitutional: Negative.   HENT: Negative.   Respiratory: Negative.   Cardiovascular: Negative.   Gastrointestinal: Negative.   Musculoskeletal: Negative.        Gait instability, weakness  Neurological: Negative.   Psychiatric/Behavioral: Negative.   All other systems reviewed and are negative.   Labs/Other Tests and Data Reviewed:    Recent Labs: 01/16/2021: ALT 13; BUN 21; Creatinine, Ser 0.86; Hemoglobin 12.3; Platelets 349; Potassium 4.4; Sodium 136   Recent Lipid Panel Lab Results  Component  Value Date/Time   CHOL 186 03/10/2017 04:24 AM   TRIG 147 03/10/2017 04:24 AM   HDL 43 03/10/2017 04:24 AM   CHOLHDL 4.3 03/10/2017 04:24 AM   LDLCALC 114 (H) 03/10/2017 04:24 AM    Wt Readings from Last 3 Encounters:  03/17/21 165 lb (74.8 kg)  01/16/21 163 lb 2.3 oz (74 kg)  11/29/20 162 lb (73.5 kg)     Exam:    Vital Signs: Vital signs may also be detailed in the HPI BP 140/66 (BP Location: Right Arm, Patient Position: Sitting, Cuff Size: Large)   Pulse 62   Ht 5\' 3"  (1.6 m)   Wt 165 lb (74.8 kg)   SpO2 98%   BMI 29.23 kg/m  Constitutional:  oriented to person, place, and time. No distress.  Presenting in a wheelchair HENT:  Head: Grossly normal Eyes:  no discharge. No scleral icterus.  Neck: No JVD, no carotid bruits  Cardiovascular: Regular rate and rhythm, no murmurs appreciated Pulmonary/Chest: Clear to auscultation bilaterally, no wheezes or rails Abdominal: Soft.  no distension.  no tenderness.  Musculoskeletal: Normal range of motion Neurological:  normal muscle tone. Coordination normal. No atrophy Skin: Skin warm and dry Psychiatric: normal affect, pleasant   ASSESSMENT & PLAN:    1. Chronic diastolic congestive heart failure (HCC) Euvolemic per her report Will get labs from PMD  2. Complete heart block ([place]) Has pacemaker Followed by EP She would like to transfer to [place], we will try to make arrangements for her  3. Hyperlipidemia, unspecified hyperlipidemia type On Lipitor Lab work done through primary care  4. Hypertension, unspecified type Blood pressure is well controlled on today's visit. No changes made to the medications.  5. Aortic valve stenosis, etiology of cardiac valve disease unspecified S/p TAVR, Doing well Repeat 1 year  6.  Coronary artery disease with stable angina Currently with no symptoms of angina. No further workup at this time. Continue current medication regimen.    Total encounter time more than 25  minutes  Greater than 50% was spent in counseling and coordination of care with the patient    Signed, Ida Rogue, MD  03/17/2021 6:02 PM    [place] [facility] [id number], [place], [place] [address]

## 2021-03-17 ENCOUNTER — Encounter: Payer: Self-pay | Admitting: Cardiovascular Disease

## 2021-03-17 ENCOUNTER — Other Ambulatory Visit: Payer: Self-pay

## 2021-03-17 ENCOUNTER — Ambulatory Visit: Payer: Medicare Other | Admitting: Cardiovascular Disease

## 2021-03-17 VITALS — BP 140/66 | HR 62 | Ht 63.0 in | Wt 165.0 lb

## 2021-03-17 DIAGNOSIS — I442 Atrioventricular block, complete: Secondary | ICD-10-CM | POA: Diagnosis not present

## 2021-03-17 DIAGNOSIS — Z953 Presence of xenogenic heart valve: Secondary | ICD-10-CM | POA: Diagnosis not present

## 2021-03-17 DIAGNOSIS — I25118 Atherosclerotic heart disease of native coronary artery with other forms of angina pectoris: Secondary | ICD-10-CM

## 2021-03-17 DIAGNOSIS — I10 Essential (primary) hypertension: Secondary | ICD-10-CM

## 2021-03-17 DIAGNOSIS — I5032 Chronic diastolic (congestive) heart failure: Secondary | ICD-10-CM

## 2021-03-17 DIAGNOSIS — Z950 Presence of cardiac pacemaker: Secondary | ICD-10-CM | POA: Diagnosis not present

## 2021-03-17 DIAGNOSIS — I350 Nonrheumatic aortic (valve) stenosis: Secondary | ICD-10-CM | POA: Diagnosis not present

## 2021-03-17 NOTE — Patient Instructions (Addendum)
We will arrange to get you schedule with  Dr. Ileana Ladd and Dr. Quentin Ore at the [facility] office.    Medication Instructions:  No changes  If you need a refill on your cardiac medications before your next appointment, please call your pharmacy.    Lab work: No new labs needed    Testing/Procedures: No new testing needed   Follow-Up: At [facility], you and your health needs are our priority.  As part of our continuing mission to provide you with exceptional heart care, we have created designated Provider Care Teams.  These Care Teams include your primary Cardiologist (physician) and Advanced Practice Providers (APPs -  Physician Assistants and Nurse Practitioners) who all work together to provide you with the care you need, when you need it.  . You will need a follow up appointment in 12 months  . Providers on your designated Care Team:   . Murray Hodgkins, NP . Christell Faith, PA-C . Marrianne Mood, PA-C  Any Other Special Instructions Will Be Listed Below (If Applicable).  COVID-19 Vaccine Information can be found at: [email or web address] For questions related to vaccine distribution or appointments, please email vaccine@ [email or web address] or call [phone].

## 2021-03-21 ENCOUNTER — Encounter: Payer: Self-pay | Admitting: Gastroenterology

## 2021-03-21 ENCOUNTER — Other Ambulatory Visit: Payer: Self-pay

## 2021-03-21 ENCOUNTER — Ambulatory Visit: Payer: Medicare Other | Admitting: Gastroenterology

## 2021-03-21 VITALS — BP 144/74 | HR 92 | Ht 63.0 in | Wt 165.6 lb

## 2021-03-21 DIAGNOSIS — R110 Nausea: Secondary | ICD-10-CM

## 2021-03-21 DIAGNOSIS — K581 Irritable bowel syndrome with constipation: Secondary | ICD-10-CM | POA: Diagnosis not present

## 2021-03-21 MED ORDER — PANTOPRAZOLE SODIUM 40 MG PO TBEC: 40.0000 mg | DELAYED_RELEASE_TABLET | Freq: Every day | ORAL | 2 refills | Status: DC

## 2021-03-21 NOTE — Progress Notes (Signed)
Jonathon Bellows MD, MRCP(U.K) [address]  [place]  [place], [place] [address]  Main: [phone]  Fax: [phone]   Gastroenterology Consultation  Referring Provider:     Danelle Berry, NP Primary Care Physician:  Danelle Berry, NP Primary Gastroenterologist:  Dr. Jonathon Bellows  Reason for Consultation:     Constipation        HPI:   Catherine [name] is a 80 y.o. y/o female referred for consultation & management  by  Danelle Berry, NP.     Review of epic shows that her last colonoscopy was back in 2007 by Dr. Elta Guadeloupe.  2 mm polyp was resected in the sigmoid colon and sent to pathology.  She states that she has had chronic nausea since at least 2 years.  Usually early in the morning but sometimes can wake her up in the night.  Denies any vomiting.  Does have some sensation of fullness and early satiety abdominal distention and discomfort over the lower part of the abdomen.  Partially relieved after bowel movement.  She takes Metamucil daily and does consume some artificial sugars.  Denies any NSAID use.  Has a bowel movement every day.  States it is soft in nature.  She states that she was diabetic in the past but now is prediabetic.  She is on pantoprazole 20 mg a day.  Past Medical History:  Diagnosis Date  . Acute colitis   . Anemia   . CAD (coronary artery disease)    a. heavy calcification of the entire LAD & mod eccentric mid LAD stenosis, estimated @ 70%, mild nonobs stenosis of RCA and LCx, severely calcified Ao valve with restricted valve mobility and 2+ AI    . Cancer [facility]) 1981   breast  . CHB (complete heart block) ([place]) 08/31/2015   [place] DR model [id number] (serial number [id number] )   . Chronic diastolic congestive heart failure ([place])    a. echo 08/31/2015: EF 65-70%, nl WM, GR1DD, Ao valve stent bioprosthesis was present and functioning nl, no regurg, LA mildly dilated, PASP 46 mm Hg  . Collagen vascular disease ([place])   . Colon polyps    . Depression   . Diabetes mellitus type II   . Fibromyalgia   . HTN (hypertension)   . Hypercholesteremia   . Hypothyroidism   . IBS (irritable bowel syndrome)   . Rectal bleeding   . Rheumatoid arthritis(714.0)   . S/P TAVR (transcatheter aortic valve replacement) 08/30/2015   26 mm Edwards Sapien 3 transcatheter heart valve placed via open right transfemoral approach  . Shortness of breath dyspnea   . Stenosis of aortic valve   . Suicide attempt ([place])   . Thyroid disease     Past Surgical History:  Procedure Laterality Date  . ABDOMINAL SURGERY     Intestinal surgery  . CARDIAC SURGERY    . CESAREAN SECTION     x 2  . EP IMPLANTABLE DEVICE N/A 08/31/2015   Procedure: Pacemaker Implant;  Surgeon: Will Meredith Leeds, MD; [place] (serial number [id number] ); Laterality: Right  . LAPAROSCOPY N/A 01/31/2020   Procedure: LAPAROSCOPY DIAGNOSTIC;  Surgeon: Kieth Brightly Arta Bruce, MD;  Location: [place];  Service: General;  Laterality: N/A;  . LAPAROTOMY N/A 01/31/2020   Procedure: Exploratory Laparotomy;  Surgeon: Kieth Brightly Arta Bruce, MD;  Location: [place];  Service: General;  Laterality: N/A;  . LEFT OOPHORECTOMY    .  LYSIS OF ADHESION  01/31/2020   Procedure: Lysis Of Adhesions, Ligation of Falciform Vein;  Surgeon: Kinsinger, Arta Bruce, MD;  Location: [place];  Service: General;;  . MASTECTOMY Left   . polyp     . self inflicted chest wound    . [place]   lower back  . TEE WITHOUT CARDIOVERSION N/A 08/30/2015   Procedure: TRANSESOPHAGEAL ECHOCARDIOGRAM (TEE);  Surgeon: Sherren Mocha, MD;  Location: [place];  Service: Open Heart Surgery;  Laterality: N/A;  . TOTAL ABDOMINAL HYSTERECTOMY    . TRANSCATHETER AORTIC VALVE REPLACEMENT, TRANSFEMORAL N/A 08/30/2015   Procedure: TRANSCATHETER AORTIC VALVE REPLACEMENT, TRANSFEMORAL;  Surgeon: Sherren Mocha, MD;  Location: [place];  Service: Open Heart Surgery;  Laterality: N/A;    Prior to Admission  medications   Medication Sig Start Date End Date Taking? Authorizing Provider  acetaminophen (TYLENOL) 500 MG tablet Take 1,000 mg by mouth every 6 (six) hours as needed for mild pain.    [provider]  Amino Acids-Protein Hydrolys (FEEDING SUPPLEMENT, PRO-STAT SUGAR FREE 64,) LIQD Take 30 mLs by mouth 3 (three) times daily. 02/16/20   Meuth, Blaine Hamper, PA-C  aspirin EC 81 MG tablet Take 81 mg by mouth daily.    [provider]  atorvastatin (LIPITOR) 20 MG tablet TAKE 1 TABLET BY MOUTH  DAILY 08/04/20   Minna Merritts, MD  benztropine (COGENTIN) 1 MG tablet Take 1 mg by mouth 2 (two) times daily.    Anabel Bene, MD  budesonide (PULMICORT) 0.5 MG/2ML nebulizer solution Take 2 mLs by nebulization 2 (two) times daily. 01/11/20   [provider]  clonazePAM (KLONOPIN) 0.5 MG tablet Take 1 tablet (0.5 mg total) by mouth at bedtime. 02/06/21   Arfeen, Arlyce [place], MD  docusate sodium (COLACE) 100 MG capsule Take 1 capsule (100 mg total) by mouth 2 (two) times daily. 01/16/21 01/16/22  Harvest Dark, MD  feeding supplement, ENSURE ENLIVE, (ENSURE ENLIVE) LIQD Take 237 mLs by mouth 3 (three) times daily between meals. 02/16/20   Meuth, Brooke A, PA-C  furosemide (LASIX) 20 MG tablet Take 20 mg by mouth every other day.  11/10/19   [provider]  LANTUS SOLOSTAR 100 UNIT/ML Solostar Pen SMARTSIG:16 Unit(s) SUB-Q Every Night 11/10/19   [provider]  levothyroxine (SYNTHROID) 100 MCG tablet Take 100 mcg by mouth daily before breakfast.    [provider]  losartan (COZAAR) 50 MG tablet Take 1 tablet (50 mg total) by mouth daily. 08/10/20 11/08/20  Minna Merritts, MD  mirabegron ER (MYRBETRIQ) 50 MG TB24 tablet Take 1 tablet (50 mg total) by mouth daily. 01/06/20   Zara Council A, PA-C  mirtazapine (REMERON) 15 MG tablet Take 1 tablet (15 mg total) by mouth at bedtime. 02/06/21   Arfeen, Arlyce [place], MD  nitroGLYCERIN (NITROSTAT) 0.4 MG SL tablet PLACE 1  TABLET (0.4 MG TOTAL) UNDER THE TONGUE EVERY 5 (FIVE) MINUTES AS NEEDED FOR CHEST PAIN. 11/17/19   Minna Merritts, MD  pantoprazole (PROTONIX) 40 MG tablet Take 40 mg by mouth daily. 01/08/20   [provider]  potassium chloride (KLOR-CON) 10 MEQ tablet TAKE 1 TABLET BY MOUTH EVERY OTHER DAY 01/11/21   Rise Mu, PA-C  PROCTOSOL HC 2.5 % rectal cream Apply 1 application topically as directed. 03/14/21   [provider]  sertraline (ZOLOFT) 50 MG tablet Take 2.5 tablets (125 mg total) by mouth daily. 02/06/21   Arfeen, Arlyce [place], MD  traZODone (  DESYREL) 50 MG tablet Take trazodone 50 mg as needed, take 1-2 tabs at night 08/04/20   Anabel Bene, MD    Family History  Problem Relation Age of Onset  . Depression Sister   . Depression Sister   . Depression Sister   . Hypertension Mother   . Lung cancer Sister   . Stroke Sister   . Hypertension Sister   . Pneumonia Brother   . Heart attack Neg Hx      Social History   Tobacco Use  . Smoking status: Never Smoker  . Smokeless tobacco: Never Used  Vaping Use  . Vaping Use: Never used  Substance Use Topics  . Alcohol use: No    Alcohol/week: 0.0 standard drinks  . Drug use: No    Allergies as of 03/21/2021 - Review Complete 03/21/2021  Allergen Reaction Noted  . Tetracyclines & related Anaphylaxis and Rash 01/31/2020  . Latex  01/31/2020  . Sulfa antibiotics Hives 01/14/2012  . Latex Rash 08/05/2015    Review of Systems:    All systems reviewed and negative except where noted in HPI.   Physical Exam:  BP (!) 144/74 (BP Location: Right Arm, Patient Position: Sitting, Cuff Size: Large)   Pulse 92   Ht 5\' 3"  (1.6 m)   Wt 165 lb 9.6 oz (75.1 kg)   BMI 29.33 kg/m  No LMP recorded. Patient has had a hysterectomy. Psych:  Alert and cooperative. Normal mood and affect. General:   Alert,  Well-developed, well-nourished, pleasant and cooperative in NAD Head:  Normocephalic and atraumatic. Eyes:  Sclera clear, no  icterus.   Conjunctiva pink. Ears:  Normal auditory acuity. Lungs:  Respirations even and unlabored.  Clear throughout to auscultation.   No wheezes, crackles, or rhonchi. No acute distress. Heart:  Regular rate and rhythm; no murmurs, clicks, rubs, or gallops. Abdomen:  Normal bowel sounds.  No bruits.  Soft, non-tender and non-distended without masses, hepatosplenomegaly or hernias noted.  No guarding or rebound tenderness.    Neurologic:  Alert and oriented x3;  grossly normal neurologically. Psych:  Alert and cooperative. Normal mood and affect.  Imaging Studies: No results found.  Assessment and Plan:   Catherine [name] is a 80 y.o. y/o female is here to see me today for chronic nausea for many years.  Symptoms suggestive of a differential diagnosis of gastroparesis versus IBS constipation versus gas and bloating.   Plan 1.  Increase pantoprazole to 40 mg a day from 20 mg a day to empirically treat any acid reflux 2.  EGD to rule out gastric outlet obstruction after cardiac clearance 3.  Gastric emptying study 4.  Stop Metamucil, provide information on gastroparesis diet and have counseled her about the same 5.  Commence on MiraLAX 1 capful daily for constipation. 6.  If no better at next visit we will consider a CT scan of the abdomen and pelvis 7.  Stop artificial sugars  I have discussed alternative options, risks & benefits,  which include, but are not limited to, bleeding, infection, perforation,respiratory complication & drug reaction.  The patient agrees with this plan & written consent will be obtained.     Follow up in 3-4 weeks  Dr Jonathon Bellows MD,MRCP(U.K)

## 2021-03-22 NOTE — Progress Notes (Signed)
Will cc

## 2021-03-24 ENCOUNTER — Telehealth: Payer: Self-pay

## 2021-03-24 NOTE — Telephone Encounter (Signed)
   [facility] Pre-operative Risk Assessment    Patient Name: Catherine [name]  DOB: August 28, 1941  MRN: [id number]   HEARTCARE STAFF: - Please ensure there is not already an duplicate clearance open for this procedure. - Under Visit Info/Reason for Call, type in Other and utilize the format Clearance MM/DD/YY or Clearance TBD. Do not use dashes or single digits. - If request is for dental extraction, please clarify the # of teeth to be extracted.  Request for surgical clearance:  1. What type of surgery is being performed? EGD  2. When is this surgery scheduled? 04/04/21   3. What type of clearance is required (medical clearance vs. Pharmacy clearance to hold med vs. Both)? Medical   4. Are there any medications that need to be held prior to surgery and how long? None listed   5. Practice name and name of physician performing surgery? [facility], Jonathon Bellows, MD    6. What is the office phone number? [phone]   7.   What is the office fax number? [phone]  8.   Anesthesia type (None, local, MAC, general) ? General    Mendel Ryder 03/24/2021, 12:54 PM  _________________________________________________________________   (provider comments below)

## 2021-03-24 NOTE — Telephone Encounter (Signed)
    ELAIN WIXON DOB:  1940/12/24  MRN:  [id number]   Primary Cardiologist: Ida Rogue, MD  Chart reviewed as part of pre-operative protocol coverage. Given past medical history and time since last visit, based on ACC/AHA guidelines, HEVIN JEFFCOAT would be at acceptable risk for the planned procedure without further cardiovascular testing.   I will route this recommendation to the requesting party via Epic fax function and remove from pre-op pool.  Please call with questions.  Abigail Butts, PA-C 03/24/2021, 4:40 PM

## 2021-03-29 ENCOUNTER — Telehealth: Payer: Self-pay

## 2021-03-29 NOTE — Telephone Encounter (Signed)
Called to inform patient we received the cardiac clearance back and she is cleared to have the procedure. Pt verbalized understanding.

## 2021-03-30 DIAGNOSIS — E119 Type 2 diabetes mellitus without complications: Secondary | ICD-10-CM | POA: Diagnosis not present

## 2021-03-30 DIAGNOSIS — E039 Hypothyroidism, unspecified: Secondary | ICD-10-CM | POA: Diagnosis not present

## 2021-03-30 DIAGNOSIS — F32A Depression, unspecified: Secondary | ICD-10-CM | POA: Diagnosis not present

## 2021-03-30 DIAGNOSIS — E782 Mixed hyperlipidemia: Secondary | ICD-10-CM | POA: Diagnosis not present

## 2021-03-30 DIAGNOSIS — R5383 Other fatigue: Secondary | ICD-10-CM | POA: Diagnosis not present

## 2021-03-30 DIAGNOSIS — E11610 Type 2 diabetes mellitus with diabetic neuropathic arthropathy: Secondary | ICD-10-CM | POA: Diagnosis not present

## 2021-03-30 DIAGNOSIS — I10 Essential (primary) hypertension: Secondary | ICD-10-CM | POA: Diagnosis not present

## 2021-04-04 ENCOUNTER — Encounter
Admission: RE | Disposition: A | Discharge status: Home / Self Care | Payer: Self-pay | Source: Home / Self Care | Attending: Gastroenterology

## 2021-04-04 ENCOUNTER — Ambulatory Visit
Admission: RE | Admit: 2021-04-04 | Discharge: 2021-04-04 | Disposition: A | Discharge status: Home / Self Care | Payer: Medicare Other | Attending: Gastroenterology | Admitting: Gastroenterology

## 2021-04-04 ENCOUNTER — Ambulatory Visit: Payer: Medicare Other | Admitting: Registered Nurse

## 2021-04-04 ENCOUNTER — Encounter: Payer: Self-pay | Admitting: Gastroenterology

## 2021-04-04 ENCOUNTER — Other Ambulatory Visit: Payer: Self-pay

## 2021-04-04 DIAGNOSIS — E039 Hypothyroidism, unspecified: Secondary | ICD-10-CM | POA: Insufficient documentation

## 2021-04-04 DIAGNOSIS — Z853 Personal history of malignant neoplasm of breast: Secondary | ICD-10-CM | POA: Insufficient documentation

## 2021-04-04 DIAGNOSIS — Z801 Family history of malignant neoplasm of trachea, bronchus and lung: Secondary | ICD-10-CM | POA: Insufficient documentation

## 2021-04-04 DIAGNOSIS — Z8249 Family history of ischemic heart disease and other diseases of the circulatory system: Secondary | ICD-10-CM | POA: Diagnosis not present

## 2021-04-04 DIAGNOSIS — Z79890 Hormone replacement therapy (postmenopausal): Secondary | ICD-10-CM | POA: Diagnosis not present

## 2021-04-04 DIAGNOSIS — I5032 Chronic diastolic (congestive) heart failure: Secondary | ICD-10-CM | POA: Insufficient documentation

## 2021-04-04 DIAGNOSIS — E119 Type 2 diabetes mellitus without complications: Secondary | ICD-10-CM | POA: Diagnosis not present

## 2021-04-04 DIAGNOSIS — Z882 Allergy status to sulfonamides status: Secondary | ICD-10-CM | POA: Insufficient documentation

## 2021-04-04 DIAGNOSIS — K449 Diaphragmatic hernia without obstruction or gangrene: Secondary | ICD-10-CM | POA: Diagnosis not present

## 2021-04-04 DIAGNOSIS — Z794 Long term (current) use of insulin: Secondary | ICD-10-CM | POA: Diagnosis not present

## 2021-04-04 DIAGNOSIS — K2970 Gastritis, unspecified, without bleeding: Secondary | ICD-10-CM | POA: Diagnosis not present

## 2021-04-04 DIAGNOSIS — K2950 Unspecified chronic gastritis without bleeding: Secondary | ICD-10-CM | POA: Diagnosis not present

## 2021-04-04 DIAGNOSIS — I110 Hypertensive heart disease with heart failure: Secondary | ICD-10-CM | POA: Insufficient documentation

## 2021-04-04 DIAGNOSIS — Z881 Allergy status to other antibiotic agents status: Secondary | ICD-10-CM | POA: Insufficient documentation

## 2021-04-04 DIAGNOSIS — Z91040 Latex allergy status: Secondary | ICD-10-CM | POA: Diagnosis not present

## 2021-04-04 DIAGNOSIS — Z79899 Other long term (current) drug therapy: Secondary | ICD-10-CM | POA: Insufficient documentation

## 2021-04-04 DIAGNOSIS — Z818 Family history of other mental and behavioral disorders: Secondary | ICD-10-CM | POA: Insufficient documentation

## 2021-04-04 DIAGNOSIS — R110 Nausea: Secondary | ICD-10-CM | POA: Diagnosis not present

## 2021-04-04 DIAGNOSIS — Z952 Presence of prosthetic heart valve: Secondary | ICD-10-CM | POA: Diagnosis not present

## 2021-04-04 HISTORY — DX: Presence of cardiac pacemaker: Z95.0

## 2021-04-04 HISTORY — PX: ESOPHAGOGASTRODUODENOSCOPY (EGD) WITH PROPOFOL: SHX5813

## 2021-04-04 LAB — GLUCOSE, CAPILLARY: Glucose-Capillary: 108 mg/dL — ABNORMAL HIGH (ref 70–99)

## 2021-04-04 SURGERY — ESOPHAGOGASTRODUODENOSCOPY (EGD) WITH PROPOFOL
Anesthesia: General

## 2021-04-04 MED ORDER — PROPOFOL 10 MG/ML IV BOLUS
INTRAVENOUS | Status: DC | PRN
  Administered 2021-04-04: 60 mg via INTRAVENOUS

## 2021-04-04 MED ORDER — PROPOFOL 500 MG/50ML IV EMUL
INTRAVENOUS | Status: DC | PRN
  Administered 2021-04-04: 125 ug/kg/min via INTRAVENOUS

## 2021-04-04 MED ORDER — SODIUM CHLORIDE 0.9 % IV SOLN: INTRAVENOUS | Status: DC

## 2021-04-04 MED ORDER — LIDOCAINE HCL (CARDIAC) PF 100 MG/5ML IV SOSY
PREFILLED_SYRINGE | INTRAVENOUS | Status: DC | PRN
  Administered 2021-04-04: 40 mg via INTRAVENOUS

## 2021-04-04 MED ORDER — PROPOFOL 500 MG/50ML IV EMUL
INTRAVENOUS | Status: AC
  Filled 2021-04-04: qty 50

## 2021-04-04 NOTE — Anesthesia Preprocedure Evaluation (Signed)
Anesthesia Evaluation  Patient identified by MRN, date of birth, ID band Patient awake    Airway Mallampati: II  TM Distance: >3 FB     Dental  (+) Upper Dentures, Lower Dentures   Pulmonary shortness of breath and with exertion,    breath sounds clear to auscultation       Cardiovascular hypertension, + CAD and +CHF  Normal cardiovascular exam+ dysrhythmias + pacemaker  Rhythm:Regular     Neuro/Psych PSYCHIATRIC DISORDERS Depression TIACVA    GI/Hepatic Neg liver ROS, History noted. CG   Endo/Other  diabetesHypothyroidism   Renal/GU negative Renal ROS     Musculoskeletal  (+) Arthritis , Fibromyalgia -  Abdominal   Peds negative pediatric ROS (+)  Hematology  (+) anemia ,   Anesthesia Other Findings Past Medical History: No date: Acute colitis No date: Anemia No date: CAD (coronary artery disease)     Comment:  a. heavy calcification of the entire LAD & mod eccentric              mid LAD stenosis, estimated @ 70%, mild nonobs stenosis               of RCA and LCx, severely calcified Ao valve with               restricted valve mobility and 2+ AI   1981: Cancer (HCC)     Comment:  breast 08/31/2015: CHB (complete heart block) ([place])     Comment:  [employer] DR model [id number] (serial               number [id number] )  No date: Chronic diastolic congestive heart failure (HCC)     Comment:  a. echo 08/31/2015: EF 65-70%, nl WM, GR1DD, Ao valve               stent bioprosthesis was present and functioning nl, no               regurg, LA mildly dilated, PASP 46 mm Hg No date: Collagen vascular disease (HCC) No date: Colon polyps No date: Depression No date: Diabetes mellitus type II No date: Fibromyalgia No date: HTN (hypertension) No date: Hypercholesteremia No date: Hypothyroidism No date: IBS (irritable bowel syndrome) No date: Presence of permanent cardiac pacemaker No date: Rectal bleeding No  date: Rheumatoid arthritis(714.0) 08/30/2015: S/P TAVR (transcatheter aortic valve replacement)     Comment:  26 mm Edwards Sapien 3 transcatheter heart valve placed               via open right transfemoral approach No date: Shortness of breath dyspnea No date: Stenosis of aortic valve No date: Suicide attempt ([place]) No date: Thyroid disease  Reproductive/Obstetrics                             Anesthesia Physical  Anesthesia Plan  ASA: III  Anesthesia Plan: General   Post-op Pain Management:    Induction: Intravenous  PONV Risk Score and Plan: Propofol infusion  Airway Management Planned: Nasal Cannula  Additional Equipment:   Intra-op Plan:   Post-operative Plan:   Informed Consent: I have reviewed the patients History and Physical, chart, labs and discussed the procedure including the risks, benefits and alternatives for the proposed anesthesia with the patient or authorized representative who has indicated his/her understanding and acceptance.     Dental advisory given  Plan Discussed with: CRNA and Anesthesiologist  Anesthesia  Plan Comments:         Anesthesia Quick Evaluation

## 2021-04-04 NOTE — Anesthesia Procedure Notes (Signed)
Date/Time: 04/04/2021 9:20 AM Performed by: Doreen Salvage, CRNA Pre-anesthesia Checklist: Patient identified, Emergency Drugs available, Suction available and Patient being monitored Patient Re-evaluated:Patient Re-evaluated prior to induction Oxygen Delivery Method: Nasal cannula Induction Type: IV induction Dental Injury: Teeth and Oropharynx as per pre-operative assessment  Comments: Nasal cannula with etCO2 monitoring

## 2021-04-04 NOTE — Anesthesia Postprocedure Evaluation (Signed)
Anesthesia Post Note  Patient: Catherine [name]  Procedure(s) Performed: ESOPHAGOGASTRODUODENOSCOPY (EGD) WITH PROPOFOL (N/A )  Patient location during evaluation: Endoscopy Anesthesia Type: General Level of consciousness: awake and alert and oriented Pain management: pain level controlled Vital Signs Assessment: post-procedure vital signs reviewed and stable Respiratory status: spontaneous breathing Cardiovascular status: blood pressure returned to baseline Anesthetic complications: no   No complications documented.   Last Vitals:  Vitals:   04/04/21 0950 04/04/21 1000  BP: (!) 173/62 (!) 177/74  Pulse: 70 61  Resp: (!) 25 17  Temp:    SpO2: 100% 100%    Last Pain:  Vitals:   04/04/21 0930  TempSrc: Temporal  PainSc:                  Breydan Shillingburg

## 2021-04-04 NOTE — Transfer of Care (Signed)
Immediate Anesthesia Transfer of Care Note  Patient: Catherine [name]  Procedure(s) Performed: ESOPHAGOGASTRODUODENOSCOPY (EGD) WITH PROPOFOL (N/A )  Patient Location: PACU and Endoscopy Unit  Anesthesia Type:General  Level of Consciousness: awake, alert  and oriented  Airway & Oxygen Therapy: Patient Spontanous Breathing  Post-op Assessment: Report given to RN and Post -op Vital signs reviewed and stable  Post vital signs: Reviewed and stable  Last Vitals:  Vitals Value Taken Time  BP 155/72 04/04/21 0937  Temp 36.1 C 04/04/21 0930  Pulse 65 04/04/21 0938  Resp 19 04/04/21 0938  SpO2 98 % 04/04/21 0938  Vitals shown include unvalidated device data.  Last Pain:  Vitals:   04/04/21 0930  TempSrc: Temporal  PainSc:          Complications: No complications documented.

## 2021-04-04 NOTE — H&P (Signed)
Jonathon Bellows, MD [address], [place], [place], [place], [address] [address], [place], [place], [place], [address] Phone: [phone]  Fax: [phone]  Primary Care Physician:  Danelle Berry, NP   Pre-Procedure History & Physical: HPI:  Catherine [name] is a 80 y.o. female is here for an endoscopy    Past Medical History:  Diagnosis Date  . Acute colitis   . Anemia   . CAD (coronary artery disease)    a. heavy calcification of the entire LAD & mod eccentric mid LAD stenosis, estimated @ 70%, mild nonobs stenosis of RCA and LCx, severely calcified Ao valve with restricted valve mobility and 2+ AI    . Cancer [facility]) 1981   breast  . CHB (complete heart block) ([place]) 08/31/2015   [place] DR model [id number] (serial number [id number] )   . Chronic diastolic congestive heart failure ([place])    a. echo 08/31/2015: EF 65-70%, nl WM, GR1DD, Ao valve stent bioprosthesis was present and functioning nl, no regurg, LA mildly dilated, PASP 46 mm Hg  . Collagen vascular disease ([place])   . Colon polyps   . Depression   . Diabetes mellitus type II   . Fibromyalgia   . HTN (hypertension)   . Hypercholesteremia   . Hypothyroidism   . IBS (irritable bowel syndrome)   . Presence of permanent cardiac pacemaker   . Rectal bleeding   . Rheumatoid arthritis(714.0)   . S/P TAVR (transcatheter aortic valve replacement) 08/30/2015   26 mm Edwards Sapien 3 transcatheter heart valve placed via open right transfemoral approach  . Shortness of breath dyspnea   . Stenosis of aortic valve   . Suicide attempt ([place])   . Thyroid disease     Past Surgical History:  Procedure Laterality Date  . ABDOMINAL SURGERY     Intestinal surgery  . CARDIAC SURGERY    . CESAREAN SECTION     x 2  . EP IMPLANTABLE DEVICE N/A 08/31/2015   Procedure: Pacemaker Implant;  Surgeon: Will Meredith Leeds, MD; [place] (serial number [id number] ); Laterality: Right  .  LAPAROSCOPY N/A 01/31/2020   Procedure: LAPAROSCOPY DIAGNOSTIC;  Surgeon: Kieth Brightly Arta Bruce, MD;  Location: [facility];  Service: General;  Laterality: N/A;  . LAPAROTOMY N/A 01/31/2020   Procedure: Exploratory Laparotomy;  Surgeon: Kieth Brightly Arta Bruce, MD;  Location: [facility];  Service: General;  Laterality: N/A;  . LEFT OOPHORECTOMY    . LYSIS OF ADHESION  01/31/2020   Procedure: Lysis Of Adhesions, Ligation of Falciform Vein;  Surgeon: Kinsinger, Arta Bruce, MD;  Location: [facility];  Service: General;;  . MASTECTOMY Left   . polyp     . self inflicted chest wound    . [place]   lower back  . TEE WITHOUT CARDIOVERSION N/A 08/30/2015   Procedure: TRANSESOPHAGEAL ECHOCARDIOGRAM (TEE);  Surgeon: Sherren Mocha, MD;  Location: [facility];  Service: Open Heart Surgery;  Laterality: N/A;  . TOTAL ABDOMINAL HYSTERECTOMY    . TRANSCATHETER AORTIC VALVE REPLACEMENT, TRANSFEMORAL N/A 08/30/2015   Procedure: TRANSCATHETER AORTIC VALVE REPLACEMENT, TRANSFEMORAL;  Surgeon: Sherren Mocha, MD;  Location: [facility];  Service: Open Heart Surgery;  Laterality: N/A;    Prior to Admission medications   Medication Sig Start Date End Date Taking? Authorizing Provider  atorvastatin (LIPITOR) 20 MG tablet TAKE 1 TABLET BY MOUTH  DAILY 08/04/20  Yes Gollan, Kathlene November, MD  benztropine (COGENTIN)  1 MG tablet Take 1 mg by mouth 2 (two) times daily.   Yes Anabel Bene, MD  clonazePAM (KLONOPIN) 0.5 MG tablet Take 1 tablet (0.5 mg total) by mouth at bedtime. 02/06/21  Yes Arfeen, Arlyce Harman, MD  furosemide (LASIX) 20 MG tablet Take 20 mg by mouth every other day.  11/10/19  Yes [provider]  LANTUS SOLOSTAR 100 UNIT/ML Solostar Pen SMARTSIG:16 Unit(s) SUB-Q Every Night 11/10/19  Yes [provider]  levothyroxine (SYNTHROID) 100 MCG tablet Take 100 mcg by mouth daily before breakfast.   Yes [provider]  mirtazapine (REMERON) 15 MG tablet Take 1 tablet (15 mg total) by mouth at bedtime. 02/06/21   Yes Arfeen, Arlyce Harman, MD  oxybutynin (DITROPAN-XL) 10 MG 24 hr tablet Take 10 mg by mouth daily. 03/14/21  Yes [provider]  pantoprazole (PROTONIX) 40 MG tablet Take 1 tablet (40 mg total) by mouth daily. 03/21/21 05/21/21 Yes Jonathon Bellows, MD  potassium chloride (KLOR-CON) 10 MEQ tablet TAKE 1 TABLET BY MOUTH EVERY OTHER DAY 01/11/21  Yes Dunn, Areta Haber, PA-C  sertraline (ZOLOFT) 50 MG tablet Take 2.5 tablets (125 mg total) by mouth daily. 02/06/21  Yes Arfeen, Arlyce Harman, MD  traZODone (DESYREL) 50 MG tablet Take trazodone 50 mg as needed, take 1-2 tabs at night 08/04/20  Yes Anabel Bene, MD  acetaminophen (TYLENOL) 500 MG tablet Take 1,000 mg by mouth every 6 (six) hours as needed for mild pain.    [provider]  Amino Acids-Protein Hydrolys (FEEDING SUPPLEMENT, PRO-STAT SUGAR FREE 64,) LIQD Take 30 mLs by mouth 3 (three) times daily. 02/16/20   Meuth, Blaine Hamper, PA-C  aspirin EC 81 MG tablet Take 81 mg by mouth daily. Patient not taking: Reported on 03/21/2021    [provider]  budesonide (PULMICORT) 0.5 MG/2ML nebulizer solution Take 2 mLs by nebulization 2 (two) times daily. 01/11/20   [provider]  docusate sodium (COLACE) 100 MG capsule Take 1 capsule (100 mg total) by mouth 2 (two) times daily. 01/16/21 01/16/22  Harvest Dark, MD  feeding supplement, ENSURE ENLIVE, (ENSURE ENLIVE) LIQD Take 237 mLs by mouth 3 (three) times daily between meals. Patient not taking: Reported on 03/21/2021 02/16/20   Margie Billet A, PA-C  losartan (COZAAR) 50 MG tablet Take 1 tablet (50 mg total) by mouth daily. 08/10/20 11/08/20  Minna Merritts, MD  mirabegron ER (MYRBETRIQ) 50 MG TB24 tablet Take 1 tablet (50 mg total) by mouth daily. Patient not taking: Reported on 03/21/2021 01/06/20   Zara Council A, PA-C  nitroGLYCERIN (NITROSTAT) 0.4 MG SL tablet PLACE 1 TABLET (0.4 MG TOTAL) UNDER THE TONGUE EVERY 5 (FIVE) MINUTES AS NEEDED FOR CHEST PAIN. 11/17/19   Minna Merritts,  MD  PROCTOSOL HC 2.5 % rectal cream Apply 1 application topically as directed. 03/14/21   [provider]    Allergies as of 03/21/2021 - Review Complete 03/21/2021  Allergen Reaction Noted  . Tetracyclines & related Anaphylaxis and Rash 01/31/2020  . Latex  01/31/2020  . Sulfa antibiotics Hives 01/14/2012  . Latex Rash 08/05/2015    Family History  Problem Relation Age of Onset  . Depression Sister   . Depression Sister   . Depression Sister   . Hypertension Mother   . Lung cancer Sister   . Stroke Sister   . Hypertension Sister   . Pneumonia Brother   . Heart attack Neg Hx     Social History  Socioeconomic History  . Marital status: Married    Spouse name: Not on file  . Number of children: Not on file  . Years of education: Not on file  . Highest education level: Not on file  Occupational History  . Not on file  Tobacco Use  . Smoking status: Never Smoker  . Smokeless tobacco: Never Used  Vaping Use  . Vaping Use: Never used  Substance and Sexual Activity  . Alcohol use: No    Alcohol/week: 0.0 standard drinks  . Drug use: No  . Sexual activity: Not Currently  Other Topics Concern  . Not on file  Social History Narrative   ** Merged History Encounter **       Social Determinants of Health   Financial Resource Strain: Not on file  Food Insecurity: Not on file  Transportation Needs: Not on file  Physical Activity: Not on file  Stress: Not on file  Social Connections: Not on file  Intimate Partner Violence: Not on file    Review of Systems: See HPI, otherwise negative ROS  Physical Exam: There were no vitals taken for this visit. General:   Alert,  pleasant and cooperative in NAD Head:  Normocephalic and atraumatic. Neck:  Supple; no masses or thyromegaly. Lungs:  Clear throughout to auscultation, normal respiratory effort.    Heart:  +S1, +S2, Regular rate and rhythm, No edema. Abdomen:  Soft, nontender and nondistended. Normal bowel  sounds, without guarding, and without rebound.   Neurologic:  Alert and  oriented x4;  grossly normal neurologically.  Impression/Plan: Catherine [name] is here for an endoscopy  to be performed for  evaluation of nausea and vomiting     Risks, benefits, limitations, and alternatives regarding endoscopy have been reviewed with the patient.  Questions have been answered.  All parties agreeable.   Jonathon Bellows, MD  04/04/2021, 9:03 AM

## 2021-04-04 NOTE — Op Note (Signed)
[facility] Patient Name: Catherine [name] Procedure Date: 04/04/2021 9:16 AM MRN: [id number] Account #: [id number] Date of Birth: 06/02/1941 Admit Type: Outpatient Age: 80 Room: [facility] ENDO ROOM 2 Gender: Female Note Status: Finalized Procedure:             Upper GI endoscopy Indications:           Nausea Providers:             Jonathon Bellows MD, MD Referring MD:          No Local Md, MD (Referring MD) Medicines:             Monitored Anesthesia Care Complications:         No immediate complications. Procedure:             Pre-Anesthesia Assessment:                        - Prior to the procedure, a History and Physical was                         performed, and patient medications, allergies and                         sensitivities were reviewed. The patient's tolerance                         of previous anesthesia was reviewed.                        - The risks and benefits of the procedure and the                         sedation options and risks were discussed with the                         patient. All questions were answered and informed                         consent was obtained.                        - ASA Grade Assessment: II - A patient with mild                         systemic disease.                        After obtaining informed consent, the endoscope was                         passed under direct vision. Throughout the procedure,                         the patient's blood pressure, pulse, and oxygen                         saturations were monitored continuously. The Endoscope                         was introduced through the mouth, and advanced  to the                         third part of duodenum. The upper GI endoscopy was                         accomplished with ease. The patient tolerated the                         procedure well. Findings:      The esophagus was normal.      A small hiatal hernia was present.       Patchy moderate inflammation characterized by congestion (edema) and       erythema was found in the gastric antrum. Biopsies were taken with a       cold forceps for histology.      The cardia and gastric fundus were normal on retroflexion.      The examined duodenum was normal. Impression:            - Normal esophagus.                        - Small hiatal hernia.                        - Gastritis. Biopsied.                        - Normal examined duodenum. Recommendation:        - Discharge patient to home (with escort).                        - Resume previous diet.                        - Continue present medications.                        - Await pathology results.                        - Return to my office as previously scheduled. Procedure Code(s):     --- Professional ---                        [phone], Esophagogastroduodenoscopy, flexible,                         transoral; with biopsy, single or multiple Diagnosis Code(s):     --- Professional ---                        K44.9, Diaphragmatic hernia without obstruction or                         gangrene                        K29.70, Gastritis, unspecified, without bleeding                        R11.0, Nausea CPT copyright 2019 American Medical Association. All rights reserved. The codes documented in this report are preliminary and  upon coder review may  be revised to meet current compliance requirements. Jonathon Bellows, MD Jonathon Bellows MD, MD 04/04/2021 9:28:07 AM This report has been signed electronically. Number of Addenda: 0 Note Initiated On: 04/04/2021 9:16 AM Estimated Blood Loss:  Estimated blood loss: none.      [facility]

## 2021-04-05 ENCOUNTER — Other Ambulatory Visit (HOSPITAL_COMMUNITY): Payer: Self-pay | Admitting: Psychiatry

## 2021-04-05 DIAGNOSIS — F331 Major depressive disorder, recurrent, moderate: Secondary | ICD-10-CM

## 2021-04-05 LAB — SURGICAL PATHOLOGY

## 2021-04-06 ENCOUNTER — Encounter: Payer: Self-pay | Admitting: Gastroenterology

## 2021-04-10 ENCOUNTER — Encounter: Payer: Self-pay | Admitting: Gastroenterology

## 2021-04-10 ENCOUNTER — Ambulatory Visit: Payer: Medicare Other | Admitting: Gastroenterology

## 2021-04-10 ENCOUNTER — Other Ambulatory Visit: Payer: Self-pay

## 2021-04-10 VITALS — BP 142/76 | HR 89 | Ht 63.0 in | Wt 162.2 lb

## 2021-04-10 DIAGNOSIS — K581 Irritable bowel syndrome with constipation: Secondary | ICD-10-CM | POA: Diagnosis not present

## 2021-04-10 DIAGNOSIS — R110 Nausea: Secondary | ICD-10-CM | POA: Diagnosis not present

## 2021-04-10 NOTE — Progress Notes (Signed)
Jonathon Bellows MD, MRCP(U.K) [address]  [place]  [place], [place] [address]  Main: [phone]  Fax: [phone]   Primary Care Physician: Danelle Berry, NP  Primary Gastroenterologist:  Dr. Jonathon Bellows   Follow up for Constipation  HPI: Catherine [name] is a 80 y.o. female    Summary of history :  Initially referred and seen on 03/12/2021 for chronic nausea for  at least 2 years.  Usually early in the morning but sometimes can wake her up in the night.  Denies any vomiting.  Does have some sensation of fullness and early satiety abdominal distention and discomfort over the lower part of the abdomen.  Partially relieved after bowel movement.  She takes Metamucil daily and does consume some artificial sugars.  Denies any NSAID use.  Has a bowel movement every day soft in nature. She was diabetic in the past but now is prediabetic.  She is on pantoprazole 20 mg a day.Llast colonoscopy was back in 2007  2 mm polyp was resected in the sigmoid colon and sent to pathology.  Interval history   03/21/2021-04/10/2021  04/04/2021:  Hiatal hernia and gastritis on bx which is mild, chronic and inactive  Gastric emptying study has been scheduled for 04/28/2021  She is here today with to see me with her daughter.  Since her last visit the nausea is better but continues to have on and off diarrhea.  Not taking any MiraLAX.  Current Outpatient Medications  Medication Sig Dispense Refill  . acetaminophen (TYLENOL) 500 MG tablet Take 1,000 mg by mouth every 6 (six) hours as needed for mild pain.    . Amino Acids-Protein Hydrolys (FEEDING SUPPLEMENT, PRO-STAT SUGAR FREE 64,) LIQD Take 30 mLs by mouth 3 (three) times daily. 887 mL 0  . atorvastatin (LIPITOR) 20 MG tablet TAKE 1 TABLET BY MOUTH  DAILY 90 tablet 0  . benztropine (COGENTIN) 1 MG tablet Take 1 mg by mouth 2 (two) times daily.    . budesonide (PULMICORT) 0.5 MG/2ML nebulizer solution Take 2 mLs by nebulization 2 (two) times daily.     . clonazePAM (KLONOPIN) 0.5 MG tablet Take 1 tablet (0.5 mg total) by mouth at bedtime. 30 tablet 2  . docusate sodium (COLACE) 100 MG capsule Take 1 capsule (100 mg total) by mouth 2 (two) times daily. 60 capsule 0  . furosemide (LASIX) 20 MG tablet Take 20 mg by mouth every other day.     Marland Kitchen LANTUS SOLOSTAR 100 UNIT/ML Solostar Pen SMARTSIG:16 Unit(s) SUB-Q Every Night    . levothyroxine (SYNTHROID) 100 MCG tablet Take 100 mcg by mouth daily before breakfast.    . mirtazapine (REMERON) 15 MG tablet Take 1 tablet (15 mg total) by mouth at bedtime. 90 tablet 0  . nitroGLYCERIN (NITROSTAT) 0.4 MG SL tablet PLACE 1 TABLET (0.4 MG TOTAL) UNDER THE TONGUE EVERY 5 (FIVE) MINUTES AS NEEDED FOR CHEST PAIN. 25 tablet 0  . oxybutynin (DITROPAN-XL) 10 MG 24 hr tablet Take 10 mg by mouth daily.    . pantoprazole (PROTONIX) 40 MG tablet Take 1 tablet (40 mg total) by mouth daily. 30 tablet 2  . potassium chloride (KLOR-CON) 10 MEQ tablet TAKE 1 TABLET BY MOUTH EVERY OTHER DAY 45 tablet 0  . PROCTOSOL HC 2.5 % rectal cream Apply 1 application topically as directed.    . sertraline (ZOLOFT) 50 MG tablet Take 2.5 tablets (125 mg total) by mouth daily. 225 tablet 0  . traZODone (DESYREL) 50 MG  tablet Take trazodone 50 mg as needed, take 1-2 tabs at night    . aspirin EC 81 MG tablet Take 81 mg by mouth daily. (Patient not taking: No sig reported)    . feeding supplement, ENSURE ENLIVE, (ENSURE ENLIVE) LIQD Take 237 mLs by mouth 3 (three) times daily between meals. (Patient not taking: No sig reported) 237 mL 12  . losartan (COZAAR) 50 MG tablet Take 1 tablet (50 mg total) by mouth daily. 90 tablet 3  . mirabegron ER (MYRBETRIQ) 50 MG TB24 tablet Take 1 tablet (50 mg total) by mouth daily. (Patient not taking: No sig reported) 28 tablet 0   No current facility-administered medications for this visit.    Allergies as of 04/10/2021 - Review Complete 04/10/2021  Allergen Reaction Noted  . Tetracyclines & related  Anaphylaxis and Rash 01/31/2020  . Latex  01/31/2020  . Sulfa antibiotics Hives 01/14/2012  . Latex Rash 08/05/2015    ROS:  General: Negative for anorexia, weight loss, fever, chills, fatigue, weakness. ENT: Negative for hoarseness, difficulty swallowing , nasal congestion. CV: Negative for chest pain, angina, palpitations, dyspnea on exertion, peripheral edema.  Respiratory: Negative for dyspnea at rest, dyspnea on exertion, cough, sputum, wheezing.  GI: See history of present illness. GU:  Negative for dysuria, hematuria, urinary incontinence, urinary frequency, nocturnal urination.  Endo: Negative for unusual weight change.    Physical Examination:   BP (!) 142/76 (BP Location: Right Arm, Patient Position: Sitting, Cuff Size: Normal)   Pulse 89   Ht 5\' 3"  (1.6 m)   Wt 162 lb 3.2 oz (73.6 kg)   BMI 28.73 kg/m   General: Well-nourished, well-developed in no acute distress.  Eyes: No icterus. Conjunctivae pink.  Neuro: Alert and oriented x 3.  Grossly intact. Skin: Warm and dry, no jaundice.   Psych: Alert and cooperative, normal mood and affect.   Imaging Studies: No results found.  Assessment and Plan:   Catherine [name] is a 80 y.o. y/o female  here to follow up  for chronic nausea for many years.  Symptoms suggestive of a differential diagnosis of gastroparesis versus IBS constipation versus gas and bloating.  CT scan of the abdomen February 2022 showed stool throughout the colon.  I suspect that the nausea is probably a combination of acid reflux and bloating from constipation as well as effects of artificial sugars.  Diarrhea could very well likely be an overflow diarrhea   Plan 1.    Stop pantoprazole and commence on Pepcid OTC.  At times PPIs can cause diarrhea. 2.  Stop artificial sugars 3.  MiraLAX 1 capful daily. 4.  The patient does not want any more tests or evaluations.  Not keen on gastric emptying study.  Suggested to go on an low fiber diet, small meals  more often rather than large meals at one time.   Dr Jonathon Bellows  MD,MRCP [facility]) Follow up in 6 months video visit

## 2021-04-13 ENCOUNTER — Ambulatory Visit (INDEPENDENT_AMBULATORY_CARE_PROVIDER_SITE_OTHER): Payer: Medicare Other

## 2021-04-13 DIAGNOSIS — I442 Atrioventricular block, complete: Secondary | ICD-10-CM

## 2021-04-13 LAB — CUP PACEART REMOTE DEVICE CHECK
Battery Remaining Longevity: 110 mo
Battery Remaining Percentage: 95.5 %
Battery Voltage: 2.96 V
Brady Statistic AP VP Percent: 2.5 %
Brady Statistic AP VS Percent: 3.5 %
Brady Statistic AS VP Percent: 49 %
Brady Statistic AS VS Percent: 45 %
Brady Statistic RA Percent Paced: 5.7 %
Brady Statistic RV Percent Paced: 52 %
Date Time Interrogation Session: 20220512020014
Implantable Lead Implant Date: 20160928
Implantable Lead Implant Date: 20160928
Implantable Lead Location: 753859
Implantable Lead Location: 753860
Implantable Pulse Generator Implant Date: 20160928
Lead Channel Impedance Value: 390 Ohm
Lead Channel Impedance Value: 530 Ohm
Lead Channel Pacing Threshold Amplitude: 0.75 V
Lead Channel Pacing Threshold Amplitude: 0.75 V
Lead Channel Pacing Threshold Pulse Width: 0.4 ms
Lead Channel Pacing Threshold Pulse Width: 0.4 ms
Lead Channel Sensing Intrinsic Amplitude: 4.4 mV
Lead Channel Sensing Intrinsic Amplitude: 4.9 mV
Lead Channel Setting Pacing Amplitude: 2 V
Lead Channel Setting Pacing Amplitude: 2.5 V
Lead Channel Setting Pacing Pulse Width: 0.4 ms
Lead Channel Setting Sensing Sensitivity: 2.5 mV
Pulse Gen Model: 2240
Pulse Gen Serial Number: 7779295

## 2021-04-14 ENCOUNTER — Telehealth: Payer: Self-pay

## 2021-04-14 NOTE — Telephone Encounter (Signed)
-----   Message from Jonathon Bellows, MD sent at 03/25/2021  8:16 AM EDT -----  ----- Message ----- From: Abigail Butts., PA-C Sent: 03/24/2021   4:42 PM EDT To: Jonathon Bellows, MD  Attached is the requested clearance for upcoming endoscopy. Please call with questions.

## 2021-04-27 ENCOUNTER — Other Ambulatory Visit (HOSPITAL_COMMUNITY): Payer: Self-pay | Admitting: Psychiatry

## 2021-04-27 DIAGNOSIS — F331 Major depressive disorder, recurrent, moderate: Secondary | ICD-10-CM

## 2021-04-28 ENCOUNTER — Ambulatory Visit: Payer: Medicare Other

## 2021-05-08 NOTE — Progress Notes (Signed)
Remote pacemaker transmission.   

## 2021-05-10 ENCOUNTER — Institutional Professional Consult (permissible substitution): Payer: Medicare Other | Admitting: Cardiology

## 2021-05-11 ENCOUNTER — Other Ambulatory Visit: Payer: Self-pay

## 2021-05-11 ENCOUNTER — Telehealth (HOSPITAL_COMMUNITY): Payer: Medicare Other | Admitting: Psychiatry

## 2021-05-16 NOTE — Progress Notes (Signed)
Electrophysiology Office Follow up Visit Note:    Date:  05/17/2021   ID:  Catherine [name], DOB 1941/02/24, MRN [id number]  PCP:  Danelle Berry, NP  [facility] Cardiologist:  Ida Rogue, MD  [facility] Electrophysiologist:  Vickie Epley, MD    Interval History:    Catherine [name] is a 80 y.o. female who presents for a follow up visit.  The patient has previously seen Dr. Curt Bears for management of her permanent pacemaker that was implanted for complete heart block.  Initial implant complicated by pneumothorax. She would like to transfer her care to the [facility]. Her pacemaker was implanted August 31, 2015.  On the last device interrogation was 8 to 9 years of battery longevity remaining.  Lead parameters are stable.      Past Medical History:  Diagnosis Date   Acute colitis    Anemia    CAD (coronary artery disease)    a. heavy calcification of the entire LAD & mod eccentric mid LAD stenosis, estimated @ 70%, mild nonobs stenosis of RCA and LCx, severely calcified Ao valve with restricted valve mobility and 2+ AI     Cancer (HCC) 1981   breast   CHB (complete heart block) ([place]) 08/31/2015   [employer] Jude Medical Assurity DR  model [id number] (serial number  [id number] )    Chronic diastolic congestive heart failure ([place])    a. echo 08/31/2015: EF 65-70%, nl WM, GR1DD, Ao valve stent bioprosthesis was present and functioning nl, no regurg, LA mildly dilated, PASP 46 mm Hg   Collagen vascular disease (HCC)    Colon polyps    Depression    Diabetes mellitus type II    Fibromyalgia    HTN (hypertension)    Hypercholesteremia    Hypothyroidism    IBS (irritable bowel syndrome)    Presence of permanent cardiac pacemaker    Rectal bleeding    Rheumatoid arthritis(714.0)    S/P TAVR (transcatheter aortic valve replacement) 08/30/2015   26 mm Edwards Sapien 3 transcatheter heart valve placed via open right transfemoral approach   Shortness of breath dyspnea     Stenosis of aortic valve    Suicide attempt ([place])    Thyroid disease     Past Surgical History:  Procedure Laterality Date   ABDOMINAL SURGERY     Intestinal surgery   CARDIAC SURGERY     CESAREAN SECTION     x 2   EP IMPLANTABLE DEVICE N/A 08/31/2015   Procedure: Pacemaker Implant;  Surgeon: Will Meredith Leeds, MD; [place] (serial number  [id number] ); Laterality: Right   ESOPHAGOGASTRODUODENOSCOPY (EGD) WITH PROPOFOL N/A 04/04/2021   Procedure: ESOPHAGOGASTRODUODENOSCOPY (EGD) WITH PROPOFOL;  Surgeon: Jonathon Bellows, MD;  Location[facility] ENDOSCOPY;  Service: Gastroenterology;  Laterality: N/A;   LAPAROSCOPY N/A 01/31/2020   Procedure: LAPAROSCOPY DIAGNOSTIC;  Surgeon: Kieth Brightly Arta Bruce, MD;  Location: [place];  Service: General;  Laterality: N/A;   LAPAROTOMY N/A 01/31/2020   Procedure: Exploratory Laparotomy;  Surgeon: Kieth Brightly Arta Bruce, MD;  Location: [place];  Service: General;  Laterality: N/A;   LEFT OOPHORECTOMY     LYSIS OF ADHESION  01/31/2020   Procedure: Lysis Of Adhesions, Ligation of Falciform Vein;  Surgeon: Kinsinger, Arta Bruce, MD;  Location: [place];  Service: General;;   MASTECTOMY Left    polyp      self inflicted chest wound     Garden   lower  back   TEE WITHOUT CARDIOVERSION N/A 08/30/2015   Procedure: TRANSESOPHAGEAL ECHOCARDIOGRAM (TEE);  Surgeon: Sherren Mocha, MD;  Location: [place];  Service: Open Heart Surgery;  Laterality: N/A;   TOTAL ABDOMINAL HYSTERECTOMY     TRANSCATHETER AORTIC VALVE REPLACEMENT, TRANSFEMORAL N/A 08/30/2015   Procedure: TRANSCATHETER AORTIC VALVE REPLACEMENT, TRANSFEMORAL;  Surgeon: Sherren Mocha, MD;  Location: [place];  Service: Open Heart Surgery;  Laterality: N/A;    Current Medications: Current Meds  Medication Sig   acetaminophen (TYLENOL) 500 MG tablet Take 1,000 mg by mouth every 6 (six) hours as needed for mild pain.   Amino Acids-Protein Hydrolys (FEEDING SUPPLEMENT, PRO-STAT SUGAR  FREE 64,) LIQD Take 30 mLs by mouth 3 (three) times daily.   atorvastatin (LIPITOR) 40 MG tablet Take 1 tablet by mouth daily.   benztropine (COGENTIN) 1 MG tablet Take 1 mg by mouth daily.   budesonide (PULMICORT) 0.5 MG/2ML nebulizer solution Take 2 mLs by nebulization 2 (two) times daily.   clonazePAM (KLONOPIN) 0.5 MG tablet Take 1 tablet (0.5 mg total) by mouth at bedtime.   docusate sodium (COLACE) 100 MG capsule Take 1 capsule (100 mg total) by mouth 2 (two) times daily.   feeding supplement, ENSURE ENLIVE, (ENSURE ENLIVE) LIQD Take 237 mLs by mouth 3 (three) times daily between meals.   furosemide (LASIX) 20 MG tablet Take 20 mg by mouth as needed.   glipiZIDE (GLUCOTROL XL) 5 MG 24 hr tablet Take 1 tablet by mouth daily.   LANTUS SOLOSTAR 100 UNIT/ML Solostar Pen SMARTSIG:16 Unit(s) SUB-Q Every Night   levothyroxine (SYNTHROID) 112 MCG tablet Take 112 mcg by mouth daily.   magnesium oxide (MAG-OX) 400 MG tablet Take by mouth.   metoprolol tartrate (LOPRESSOR) 25 MG tablet Take 1 tablet by mouth 2 (two) times daily.   mirabegron ER (MYRBETRIQ) 50 MG TB24 tablet Take 1 tablet (50 mg total) by mouth daily.   mirtazapine (REMERON) 15 MG tablet Take 1 tablet (15 mg total) by mouth at bedtime.   nitroGLYCERIN (NITROSTAT) 0.4 MG SL tablet PLACE 1 TABLET (0.4 MG TOTAL) UNDER THE TONGUE EVERY 5 (FIVE) MINUTES AS NEEDED FOR CHEST PAIN.   ondansetron (ZOFRAN) 8 MG tablet Ondansetron HCl 8 MG Oral Tablet QTY: 270 tablet Days: 90 Refills: 1  Written: 11/04/20 Patient Instructions: Take 1 tablet by mouth every 8 hours if needed for nausea   oxybutynin (DITROPAN-XL) 10 MG 24 hr tablet Take 10 mg by mouth daily.   potassium chloride (KLOR-CON) 10 MEQ tablet TAKE 1 TABLET BY MOUTH EVERY OTHER DAY   PROCTOSOL HC 2.5 % rectal cream Apply 1 application topically as directed.   sertraline (ZOLOFT) 50 MG tablet Take 2.5 tablets (125 mg total) by mouth daily.   traZODone (DESYREL) 50 MG tablet Take trazodone  50 mg as needed, take 1-2 tabs at night   [DISCONTINUED] oxybutynin (DITROPAN-XL) 10 MG 24 hr tablet Oxybutynin Chloride ER 10 MG Oral Tablet Extended Release 24 Hour QTY: 90 tablet Days: 90 Refills: 1  Written: 09/14/20 Patient Instructions: Take 1 tablet by mouth daily in AM     Allergies:   Tetracyclines & related, Latex, Sulfa antibiotics, and Latex   Social History   Socioeconomic History   Marital status: Married    Spouse name: Not on file   Number of children: Not on file   Years of education: Not on file   Highest education level: Not on file  Occupational History   Not on file  Tobacco Use  Smoking status: Never   Smokeless tobacco: Never  Vaping Use   Vaping Use: Never used  Substance and Sexual Activity   Alcohol use: No    Alcohol/week: 0.0 standard drinks   Drug use: No   Sexual activity: Not Currently  Other Topics Concern   Not on file  Social History Narrative   ** Merged History Encounter **       Social Determinants of Health   Financial Resource Strain: Not on file  Food Insecurity: Not on file  Transportation Needs: Not on file  Physical Activity: Not on file  Stress: Not on file  Social Connections: Not on file     Family History: The patient's family history includes Depression in her sister, sister, and sister; Hypertension in her mother and sister; Lung cancer in her sister; Pneumonia in her brother; Stroke in her sister. There is no history of Heart attack.  ROS:   Please see the history of present illness.    All other systems reviewed and are negative.  EKGs/Labs/Other Studies Reviewed:    The following studies were reviewed today:  05/17/2021 In clinic device interrogation personally reviewed Battery longevity an dlead parameters stable. CHB underlying rhythm VP 52% AP 5.8% AHRE lasting 14s c/w AT vs AFL Reprogrammed auto capture Turned off VIP  EKG:  The ekg ordered today demonstrates a-sense, v-pace rhythm  Recent  Labs: 01/16/2021: ALT 13; BUN 21; Creatinine, Ser 0.86; Hemoglobin 12.3; Platelets 349; Potassium 4.4; Sodium 136  Recent Lipid Panel    Component Value Date/Time   CHOL 186 03/10/2017 0424   TRIG 147 03/10/2017 0424   HDL 43 03/10/2017 0424   CHOLHDL 4.3 03/10/2017 0424   VLDL 29 03/10/2017 0424   LDLCALC 114 (H) 03/10/2017 0424    Physical Exam:    VS:  BP 110/60 (BP Location: Right Arm, Patient Position: Sitting, Cuff Size: Normal)   Pulse 85   Ht 5\' 3"  (1.6 m)   Wt 164 lb (74.4 kg)   SpO2 98%   BMI 29.05 kg/m     Wt Readings from Last 3 Encounters:  05/17/21 164 lb (74.4 kg)  04/10/21 162 lb 3.2 oz (73.6 kg)  04/04/21 163 lb (73.9 kg)     GEN:  Well nourished, well developed in no acute distress HEENT: Normal NECK: No JVD; No carotid bruits LYMPHATICS: No lymphadenopathy CARDIAC: RRR, no murmurs, rubs, gallops. Right sided PPM implant well healed. RESPIRATORY:  Clear to auscultation without rales, wheezing or rhonchi  ABDOMEN: Soft, non-tender, non-distended MUSCULOSKELETAL:  No edema; No deformity  SKIN: Warm and dry NEUROLOGIC:  Alert and oriented x 3 PSYCHIATRIC:  Normal affect   ASSESSMENT:    1. CHB (complete heart block) (HCC)   2. Chronic diastolic congestive heart failure ([place])   3. Pacemaker    PLAN:    In order of problems listed above:  CHB s/p PPM Interrogation shows stable lead function and battery longevity. Programmed VIP off given CHB underlying 10-11 years battery longevity Transition remotes to me  Annual follow up for device  2. Chronic diastolic HF Euvolemic today. Nyha II symptoms.  F/u 1 year.  Total time spent with patient today 35 minutes. This includes reviewing records, evaluating the patient and coordinating care.   Medication Adjustments/Labs and Tests Ordered: Current medicines are reviewed at length with the patient today.  Concerns regarding medicines are outlined above.  No orders of the defined types were placed  in this encounter.  No orders of the  defined types were placed in this encounter.    Signed, Lars Mage, MD, [facility], [facility] 05/17/2021 1:50 PM    Electrophysiology [facility]

## 2021-05-17 ENCOUNTER — Telehealth (INDEPENDENT_AMBULATORY_CARE_PROVIDER_SITE_OTHER): Payer: Medicare Other | Admitting: Psychiatry

## 2021-05-17 ENCOUNTER — Encounter: Payer: Self-pay | Admitting: Cardiology

## 2021-05-17 ENCOUNTER — Encounter (HOSPITAL_COMMUNITY): Payer: Self-pay | Admitting: Psychiatry

## 2021-05-17 ENCOUNTER — Other Ambulatory Visit: Payer: Self-pay

## 2021-05-17 ENCOUNTER — Ambulatory Visit (INDEPENDENT_AMBULATORY_CARE_PROVIDER_SITE_OTHER): Payer: Medicare Other | Admitting: Cardiology

## 2021-05-17 VITALS — BP 110/60 | HR 85 | Ht 63.0 in | Wt 164.0 lb

## 2021-05-17 DIAGNOSIS — F331 Major depressive disorder, recurrent, moderate: Secondary | ICD-10-CM

## 2021-05-17 DIAGNOSIS — I442 Atrioventricular block, complete: Secondary | ICD-10-CM

## 2021-05-17 DIAGNOSIS — F419 Anxiety disorder, unspecified: Secondary | ICD-10-CM

## 2021-05-17 DIAGNOSIS — Z950 Presence of cardiac pacemaker: Secondary | ICD-10-CM | POA: Diagnosis not present

## 2021-05-17 DIAGNOSIS — I5032 Chronic diastolic (congestive) heart failure: Secondary | ICD-10-CM | POA: Diagnosis not present

## 2021-05-17 LAB — PACEMAKER DEVICE OBSERVATION

## 2021-05-17 MED ORDER — CLONAZEPAM 0.5 MG PO TABS: 0.5000 mg | ORAL_TABLET | Freq: Every day | ORAL | 2 refills | Status: DC

## 2021-05-17 MED ORDER — MIRTAZAPINE 15 MG PO TABS: 15.0000 mg | ORAL_TABLET | Freq: Every day | ORAL | 0 refills | Status: DC

## 2021-05-17 MED ORDER — SERTRALINE HCL 50 MG PO TABS: 125.0000 mg | ORAL_TABLET | Freq: Every day | ORAL | 0 refills | Status: DC

## 2021-05-17 NOTE — Patient Instructions (Signed)
Medication Instructions:  Your physician recommends that you continue on your current medications as directed. Please refer to the Current Medication list given to you today.  *If you need a refill on your cardiac medications before your next appointment, please call your pharmacy*   Lab Work: None ordered   Testing/Procedures: None ordered   Follow-Up: At [facility], you and your health needs are our priority.  As part of our continuing mission to provide you with exceptional heart care, we have created designated Provider Care Teams.  These Care Teams include your primary Cardiologist (physician) and Advanced Practice Providers (APPs -  Physician Assistants and Nurse Practitioners) who all work together to provide you with the care you need, when you need it.  We recommend signing up for the patient portal called "MyChart".  Sign up information is provided on this After Visit Summary.  MyChart is used to connect with patients for Virtual Visits (Telemedicine).  Patients are able to view lab/test results, encounter notes, upcoming appointments, etc.  Non-urgent messages can be sent to your provider as well.   To learn more about what you can do with MyChart, go to [email or web address].    Remote monitoring is used to monitor your Pacemaker or ICD from home. This monitoring reduces the number of office visits required to check your device to one time per year. It allows [place] to keep an eye on the functioning of your device to ensure it is working properly. You are scheduled for a device check from home on 07/13/2021. You may send your transmission at any time that day. If you have a wireless device, the transmission will be sent automatically. After your physician reviews your transmission, you will receive a postcard with your next transmission date.  Your next appointment:   1 year(s)  The format for your next appointment:   In Person  Provider:   Lars Mage, MD   Thank you  for choosing [facility]!!
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
## 2021-07-13 ENCOUNTER — Ambulatory Visit (INDEPENDENT_AMBULATORY_CARE_PROVIDER_SITE_OTHER): Payer: Medicare Other

## 2021-07-13 DIAGNOSIS — I442 Atrioventricular block, complete: Secondary | ICD-10-CM | POA: Diagnosis not present

## 2021-07-14 LAB — CUP PACEART REMOTE DEVICE CHECK
Battery Remaining Longevity: 58 mo
Battery Remaining Percentage: 46 %
Battery Voltage: 2.96 V
Brady Statistic AP VP Percent: 6.3 %
Brady Statistic AP VS Percent: 1 %
Brady Statistic AS VP Percent: 93 %
Brady Statistic AS VS Percent: 1 %
Brady Statistic RA Percent Paced: 5.8 %
Brady Statistic RV Percent Paced: 99 %
Date Time Interrogation Session: 20220811020015
Implantable Lead Implant Date: 20160928
Implantable Lead Implant Date: 20160928
Implantable Lead Location: 753859
Implantable Lead Location: 753860
Implantable Pulse Generator Implant Date: 20160928
Lead Channel Impedance Value: 390 Ohm
Lead Channel Impedance Value: 530 Ohm
Lead Channel Pacing Threshold Amplitude: 0.625 V
Lead Channel Pacing Threshold Amplitude: 0.875 V
Lead Channel Pacing Threshold Pulse Width: 0.4 ms
Lead Channel Pacing Threshold Pulse Width: 0.4 ms
Lead Channel Sensing Intrinsic Amplitude: 3.3 mV
Lead Channel Sensing Intrinsic Amplitude: 6 mV
Lead Channel Setting Pacing Amplitude: 0.875
Lead Channel Setting Pacing Amplitude: 1.875
Lead Channel Setting Pacing Pulse Width: 0.4 ms
Lead Channel Setting Sensing Sensitivity: 2.5 mV
Pulse Gen Model: 2240
Pulse Gen Serial Number: 7779295

## 2021-07-25 ENCOUNTER — Other Ambulatory Visit (HOSPITAL_COMMUNITY): Payer: Self-pay | Admitting: Psychiatry

## 2021-07-25 DIAGNOSIS — F331 Major depressive disorder, recurrent, moderate: Secondary | ICD-10-CM

## 2021-08-04 NOTE — Progress Notes (Signed)
Remote pacemaker transmission.   

## 2021-08-09 DIAGNOSIS — E039 Hypothyroidism, unspecified: Secondary | ICD-10-CM | POA: Diagnosis not present

## 2021-08-09 DIAGNOSIS — E119 Type 2 diabetes mellitus without complications: Secondary | ICD-10-CM | POA: Diagnosis not present

## 2021-08-09 DIAGNOSIS — I10 Essential (primary) hypertension: Secondary | ICD-10-CM | POA: Diagnosis not present

## 2021-08-09 DIAGNOSIS — R5383 Other fatigue: Secondary | ICD-10-CM | POA: Diagnosis not present

## 2021-08-10 ENCOUNTER — Other Ambulatory Visit (HOSPITAL_COMMUNITY): Payer: Self-pay | Admitting: Psychiatry

## 2021-08-10 DIAGNOSIS — F331 Major depressive disorder, recurrent, moderate: Secondary | ICD-10-CM

## 2021-08-15 ENCOUNTER — Other Ambulatory Visit (HOSPITAL_COMMUNITY): Payer: Self-pay | Admitting: Psychiatry

## 2021-08-15 DIAGNOSIS — F331 Major depressive disorder, recurrent, moderate: Secondary | ICD-10-CM

## 2021-08-17 ENCOUNTER — Encounter (HOSPITAL_COMMUNITY): Payer: Self-pay | Admitting: Psychiatry

## 2021-08-17 ENCOUNTER — Other Ambulatory Visit: Payer: Self-pay

## 2021-08-17 ENCOUNTER — Telehealth (INDEPENDENT_AMBULATORY_CARE_PROVIDER_SITE_OTHER): Payer: Medicare Other | Admitting: Psychiatry

## 2021-08-17 DIAGNOSIS — F331 Major depressive disorder, recurrent, moderate: Secondary | ICD-10-CM | POA: Diagnosis not present

## 2021-08-17 DIAGNOSIS — F419 Anxiety disorder, unspecified: Secondary | ICD-10-CM | POA: Diagnosis not present

## 2021-08-17 MED ORDER — SERTRALINE HCL 50 MG PO TABS: 125.0000 mg | ORAL_TABLET | Freq: Every day | ORAL | 0 refills | Status: DC

## 2021-08-17 MED ORDER — MIRTAZAPINE 15 MG PO TABS: 15.0000 mg | ORAL_TABLET | Freq: Every day | ORAL | 0 refills | Status: DC

## 2021-08-17 MED ORDER — CLONAZEPAM 0.5 MG PO TABS: 0.5000 mg | ORAL_TABLET | Freq: Every day | ORAL | 2 refills | Status: DC
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
## 2021-08-25 DIAGNOSIS — E782 Mixed hyperlipidemia: Secondary | ICD-10-CM | POA: Diagnosis not present

## 2021-08-25 DIAGNOSIS — E039 Hypothyroidism, unspecified: Secondary | ICD-10-CM | POA: Diagnosis not present

## 2021-08-25 DIAGNOSIS — I10 Essential (primary) hypertension: Secondary | ICD-10-CM | POA: Diagnosis not present

## 2021-08-25 DIAGNOSIS — R7303 Prediabetes: Secondary | ICD-10-CM | POA: Diagnosis not present

## 2021-08-25 DIAGNOSIS — E785 Hyperlipidemia, unspecified: Secondary | ICD-10-CM | POA: Diagnosis not present

## 2021-10-11 ENCOUNTER — Other Ambulatory Visit: Payer: Self-pay

## 2021-10-12 ENCOUNTER — Telehealth (INDEPENDENT_AMBULATORY_CARE_PROVIDER_SITE_OTHER): Payer: Self-pay | Admitting: Gastroenterology

## 2021-10-12 ENCOUNTER — Ambulatory Visit (INDEPENDENT_AMBULATORY_CARE_PROVIDER_SITE_OTHER): Payer: Medicare Other

## 2021-10-12 DIAGNOSIS — I442 Atrioventricular block, complete: Secondary | ICD-10-CM | POA: Diagnosis not present

## 2021-10-12 DIAGNOSIS — Z91199 Patient's noncompliance with other medical treatment and regimen due to unspecified reason: Secondary | ICD-10-CM

## 2021-10-12 NOTE — Progress Notes (Signed)
No show

## 2021-10-13 LAB — CUP PACEART REMOTE DEVICE CHECK
Battery Remaining Longevity: 56 mo
Battery Remaining Percentage: 44 %
Battery Voltage: 2.96 V
Brady Statistic AP VP Percent: 5.2 %
Brady Statistic AP VS Percent: 1 %
Brady Statistic AS VP Percent: 94 %
Brady Statistic AS VS Percent: 1 %
Brady Statistic RA Percent Paced: 4.8 %
Brady Statistic RV Percent Paced: 99 %
Date Time Interrogation Session: 20221110033943
Implantable Lead Implant Date: 20160928
Implantable Lead Implant Date: 20160928
Implantable Lead Location: 753859
Implantable Lead Location: 753860
Implantable Pulse Generator Implant Date: 20160928
Lead Channel Impedance Value: 400 Ohm
Lead Channel Impedance Value: 590 Ohm
Lead Channel Pacing Threshold Amplitude: 0.625 V
Lead Channel Pacing Threshold Amplitude: 0.75 V
Lead Channel Pacing Threshold Pulse Width: 0.4 ms
Lead Channel Pacing Threshold Pulse Width: 0.4 ms
Lead Channel Sensing Intrinsic Amplitude: 4.4 mV
Lead Channel Sensing Intrinsic Amplitude: 6 mV
Lead Channel Setting Pacing Amplitude: 0.875
Lead Channel Setting Pacing Amplitude: 1.75 V
Lead Channel Setting Pacing Pulse Width: 0.4 ms
Lead Channel Setting Sensing Sensitivity: 2.5 mV
Pulse Gen Model: 2240
Pulse Gen Serial Number: 7779295

## 2021-10-20 NOTE — Progress Notes (Signed)
Remote pacemaker transmission.   

## 2021-11-01 ENCOUNTER — Other Ambulatory Visit: Payer: Self-pay

## 2021-11-01 ENCOUNTER — Encounter (HOSPITAL_COMMUNITY): Payer: Self-pay | Admitting: Psychiatry

## 2021-11-01 ENCOUNTER — Telehealth (HOSPITAL_BASED_OUTPATIENT_CLINIC_OR_DEPARTMENT_OTHER): Payer: Medicare Other | Admitting: Psychiatry

## 2021-11-01 DIAGNOSIS — F419 Anxiety disorder, unspecified: Secondary | ICD-10-CM | POA: Diagnosis not present

## 2021-11-01 DIAGNOSIS — F331 Major depressive disorder, recurrent, moderate: Secondary | ICD-10-CM | POA: Diagnosis not present

## 2021-11-01 MED ORDER — MIRTAZAPINE 15 MG PO TABS: 15.0000 mg | ORAL_TABLET | Freq: Every day | ORAL | 0 refills | Status: DC

## 2021-11-01 MED ORDER — SERTRALINE HCL 50 MG PO TABS: 125.0000 mg | ORAL_TABLET | Freq: Every day | ORAL | 0 refills | Status: DC

## 2021-11-01 MED ORDER — CLONAZEPAM 0.5 MG PO TABS: 0.5000 mg | ORAL_TABLET | Freq: Every day | ORAL | 2 refills | Status: DC
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
## 2021-11-13 ENCOUNTER — Telehealth (HOSPITAL_COMMUNITY): Payer: Medicare Other | Admitting: Psychiatry

## 2021-11-14 ENCOUNTER — Other Ambulatory Visit (HOSPITAL_COMMUNITY): Payer: Self-pay | Admitting: Psychiatry

## 2021-11-14 DIAGNOSIS — F331 Major depressive disorder, recurrent, moderate: Secondary | ICD-10-CM

## 2021-11-15 ENCOUNTER — Other Ambulatory Visit (HOSPITAL_COMMUNITY): Payer: Self-pay | Admitting: Psychiatry

## 2021-11-15 ENCOUNTER — Telehealth (HOSPITAL_COMMUNITY): Payer: Medicare Other | Admitting: Psychiatry

## 2021-11-15 DIAGNOSIS — F331 Major depressive disorder, recurrent, moderate: Secondary | ICD-10-CM

## 2021-12-06 DIAGNOSIS — Z20822 Contact with and (suspected) exposure to covid-19: Secondary | ICD-10-CM | POA: Diagnosis not present

## 2021-12-06 DIAGNOSIS — Z03818 Encounter for observation for suspected exposure to other biological agents ruled out: Secondary | ICD-10-CM | POA: Diagnosis not present

## 2021-12-29 DIAGNOSIS — R7303 Prediabetes: Secondary | ICD-10-CM | POA: Diagnosis not present

## 2021-12-29 DIAGNOSIS — E039 Hypothyroidism, unspecified: Secondary | ICD-10-CM | POA: Diagnosis not present

## 2021-12-29 DIAGNOSIS — I10 Essential (primary) hypertension: Secondary | ICD-10-CM | POA: Diagnosis not present

## 2021-12-29 DIAGNOSIS — E782 Mixed hyperlipidemia: Secondary | ICD-10-CM | POA: Diagnosis not present

## 2022-01-02 ENCOUNTER — Other Ambulatory Visit (HOSPITAL_COMMUNITY): Payer: Self-pay | Admitting: Psychiatry

## 2022-01-02 DIAGNOSIS — F331 Major depressive disorder, recurrent, moderate: Secondary | ICD-10-CM

## 2022-01-11 ENCOUNTER — Ambulatory Visit (INDEPENDENT_AMBULATORY_CARE_PROVIDER_SITE_OTHER): Payer: Medicare Other

## 2022-01-11 DIAGNOSIS — I442 Atrioventricular block, complete: Secondary | ICD-10-CM | POA: Diagnosis not present

## 2022-01-11 DIAGNOSIS — R110 Nausea: Secondary | ICD-10-CM | POA: Diagnosis not present

## 2022-01-11 DIAGNOSIS — F32A Depression, unspecified: Secondary | ICD-10-CM | POA: Diagnosis not present

## 2022-01-11 DIAGNOSIS — R7303 Prediabetes: Secondary | ICD-10-CM | POA: Diagnosis not present

## 2022-01-11 DIAGNOSIS — I10 Essential (primary) hypertension: Secondary | ICD-10-CM | POA: Diagnosis not present

## 2022-01-11 LAB — CUP PACEART REMOTE DEVICE CHECK
Battery Remaining Longevity: 53 mo
Battery Remaining Percentage: 42 %
Battery Voltage: 2.96 V
Brady Statistic AP VP Percent: 6.4 %
Brady Statistic AP VS Percent: 1 %
Brady Statistic AS VP Percent: 93 %
Brady Statistic AS VS Percent: 1 %
Brady Statistic RA Percent Paced: 6 %
Brady Statistic RV Percent Paced: 99 %
Date Time Interrogation Session: 20230209022647
Implantable Lead Implant Date: 20160928
Implantable Lead Implant Date: 20160928
Implantable Lead Location: 753859
Implantable Lead Location: 753860
Implantable Pulse Generator Implant Date: 20160928
Lead Channel Impedance Value: 390 Ohm
Lead Channel Impedance Value: 550 Ohm
Lead Channel Pacing Threshold Amplitude: 0.625 V
Lead Channel Pacing Threshold Amplitude: 0.75 V
Lead Channel Pacing Threshold Pulse Width: 0.4 ms
Lead Channel Pacing Threshold Pulse Width: 0.4 ms
Lead Channel Sensing Intrinsic Amplitude: 12 mV
Lead Channel Sensing Intrinsic Amplitude: 4.2 mV
Lead Channel Setting Pacing Amplitude: 0.875
Lead Channel Setting Pacing Amplitude: 1.75 V
Lead Channel Setting Pacing Pulse Width: 0.4 ms
Lead Channel Setting Sensing Sensitivity: 2.5 mV
Pulse Gen Model: 2240
Pulse Gen Serial Number: 7779295

## 2022-01-16 NOTE — Progress Notes (Signed)
Remote pacemaker transmission.   

## 2022-01-27 ENCOUNTER — Emergency Department: Payer: Medicare Other

## 2022-01-27 ENCOUNTER — Emergency Department
Admission: EM | Admit: 2022-01-27 | Discharge: 2022-01-27 | Disposition: A | Discharge status: Home / Self Care | Payer: Medicare Other | Attending: Emergency Medicine | Admitting: Emergency Medicine

## 2022-01-27 ENCOUNTER — Other Ambulatory Visit: Payer: Self-pay

## 2022-01-27 DIAGNOSIS — I700 Atherosclerosis of aorta: Secondary | ICD-10-CM | POA: Diagnosis not present

## 2022-01-27 DIAGNOSIS — I509 Heart failure, unspecified: Secondary | ICD-10-CM | POA: Diagnosis not present

## 2022-01-27 DIAGNOSIS — K5792 Diverticulitis of intestine, part unspecified, without perforation or abscess without bleeding: Secondary | ICD-10-CM

## 2022-01-27 DIAGNOSIS — R109 Unspecified abdominal pain: Secondary | ICD-10-CM | POA: Diagnosis not present

## 2022-01-27 DIAGNOSIS — K5732 Diverticulitis of large intestine without perforation or abscess without bleeding: Secondary | ICD-10-CM | POA: Insufficient documentation

## 2022-01-27 DIAGNOSIS — Z853 Personal history of malignant neoplasm of breast: Secondary | ICD-10-CM | POA: Diagnosis not present

## 2022-01-27 DIAGNOSIS — I110 Hypertensive heart disease with heart failure: Secondary | ICD-10-CM | POA: Insufficient documentation

## 2022-01-27 DIAGNOSIS — Z950 Presence of cardiac pacemaker: Secondary | ICD-10-CM | POA: Insufficient documentation

## 2022-01-27 DIAGNOSIS — I2510 Atherosclerotic heart disease of native coronary artery without angina pectoris: Secondary | ICD-10-CM | POA: Insufficient documentation

## 2022-01-27 DIAGNOSIS — E119 Type 2 diabetes mellitus without complications: Secondary | ICD-10-CM | POA: Insufficient documentation

## 2022-01-27 DIAGNOSIS — I10 Essential (primary) hypertension: Secondary | ICD-10-CM | POA: Diagnosis not present

## 2022-01-27 DIAGNOSIS — E039 Hypothyroidism, unspecified: Secondary | ICD-10-CM | POA: Diagnosis not present

## 2022-01-27 DIAGNOSIS — R1032 Left lower quadrant pain: Secondary | ICD-10-CM | POA: Diagnosis present

## 2022-01-27 LAB — CBC WITH DIFFERENTIAL/PLATELET
Abs Immature Granulocytes: 0.03 10*3/uL (ref 0.00–0.07)
Basophils Absolute: 0.1 10*3/uL (ref 0.0–0.1)
Basophils Relative: 0 %
Eosinophils Absolute: 0.3 10*3/uL (ref 0.0–0.5)
Eosinophils Relative: 2 %
HCT: 40.3 % (ref 36.0–46.0)
Hemoglobin: 12.6 g/dL (ref 12.0–15.0)
Immature Granulocytes: 0 %
Lymphocytes Relative: 16 %
Lymphs Abs: 2 10*3/uL (ref 0.7–4.0)
MCH: 27.9 pg (ref 26.0–34.0)
MCHC: 31.3 g/dL (ref 30.0–36.0)
MCV: 89.2 fL (ref 80.0–100.0)
Monocytes Absolute: 1 10*3/uL (ref 0.1–1.0)
Monocytes Relative: 8 %
Neutro Abs: 8.9 10*3/uL — ABNORMAL HIGH (ref 1.7–7.7)
Neutrophils Relative %: 74 %
Platelets: 281 10*3/uL (ref 150–400)
RBC: 4.52 MIL/uL (ref 3.87–5.11)
RDW: 13 % (ref 11.5–15.5)
WBC: 12.2 10*3/uL — ABNORMAL HIGH (ref 4.0–10.5)
nRBC: 0 % (ref 0.0–0.2)

## 2022-01-27 LAB — COMPREHENSIVE METABOLIC PANEL
ALT: 11 U/L (ref 0–44)
AST: 23 U/L (ref 15–41)
Albumin: 4.1 g/dL (ref 3.5–5.0)
Alkaline Phosphatase: 65 U/L (ref 38–126)
Anion gap: 9 (ref 5–15)
BUN: 24 mg/dL — ABNORMAL HIGH (ref 8–23)
CO2: 27 mmol/L (ref 22–32)
Calcium: 9.6 mg/dL (ref 8.9–10.3)
Chloride: 99 mmol/L (ref 98–111)
Creatinine, Ser: 0.91 mg/dL (ref 0.44–1.00)
GFR, Estimated: >60 mL/min (ref >60)
Glucose, Bld: 136 mg/dL — ABNORMAL HIGH (ref 70–99)
Potassium: 4.8 mmol/L (ref 3.5–5.1)
Sodium: 135 mmol/L (ref 135–145)
Total Bilirubin: 0.8 mg/dL (ref 0.3–1.2)
Total Protein: 7.6 g/dL (ref 6.5–8.1)

## 2022-01-27 LAB — APTT: aPTT: 31 seconds (ref 24–36)

## 2022-01-27 LAB — LACTIC ACID, PLASMA: Lactic Acid, Venous: 1.5 mmol/L (ref 0.5–1.9)

## 2022-01-27 LAB — URINALYSIS, COMPLETE (UACMP) WITH MICROSCOPIC
Bilirubin Urine: NEGATIVE
Glucose, UA: NEGATIVE mg/dL
Hgb urine dipstick: NEGATIVE
Ketones, ur: NEGATIVE mg/dL
Leukocytes,Ua: NEGATIVE
Nitrite: NEGATIVE
Protein, ur: NEGATIVE mg/dL
Specific Gravity, Urine: >1.046 — ABNORMAL HIGH (ref 1.005–1.030)
WBC, UA: NONE SEEN WBC/hpf (ref 0–5)
pH: 7 (ref 5.0–8.0)

## 2022-01-27 LAB — LIPASE, BLOOD: Lipase: 36 U/L (ref 11–51)

## 2022-01-27 LAB — PROTIME-INR
INR: 1 (ref 0.8–1.2)
Prothrombin Time: 13.2 seconds (ref 11.4–15.2)

## 2022-01-27 MED ORDER — OXYCODONE HCL 5 MG PO TABS: 2.5000 mg | ORAL_TABLET | Freq: Three times a day (TID) | ORAL | 0 refills | Status: AC | PRN

## 2022-01-27 MED ORDER — ONDANSETRON HCL 4 MG PO TABS: 4.0000 mg | ORAL_TABLET | Freq: Three times a day (TID) | ORAL | 0 refills | Status: DC | PRN

## 2022-01-27 MED ORDER — MORPHINE SULFATE (PF) 4 MG/ML IV SOLN
4.0000 mg | Freq: Once | INTRAVENOUS | Status: AC
  Administered 2022-01-27: 4 mg via INTRAVENOUS
  Filled 2022-01-27: qty 1

## 2022-01-27 MED ORDER — AMOXICILLIN-POT CLAVULANATE 875-125 MG PO TABS: 1.0000 | ORAL_TABLET | Freq: Two times a day (BID) | ORAL | 0 refills | Status: AC

## 2022-01-27 MED ORDER — SODIUM CHLORIDE 0.9 % IV SOLN
1.0000 g | Freq: Once | INTRAVENOUS | Status: AC
  Administered 2022-01-27: 1 g via INTRAVENOUS
  Filled 2022-01-27: qty 10

## 2022-01-27 MED ORDER — HYDROCODONE-ACETAMINOPHEN 5-325 MG PO TABS: 2.0000 | ORAL_TABLET | Freq: Four times a day (QID) | ORAL | 0 refills | Status: DC | PRN

## 2022-01-27 MED ORDER — LACTATED RINGERS IV BOLUS
500.0000 mL | Freq: Once | INTRAVENOUS | Status: AC
  Administered 2022-01-27: 500 mL via INTRAVENOUS

## 2022-01-27 MED ORDER — IOHEXOL 300 MG/ML  SOLN
100.0000 mL | Freq: Once | INTRAMUSCULAR | Status: AC | PRN
  Administered 2022-01-27: 100 mL via INTRAVENOUS
  Filled 2022-01-27: qty 100

## 2022-01-27 NOTE — ED Provider Notes (Signed)
[facility] Provider Note    Event Date/Time   First MD Initiated Contact with Patient 01/27/22 1340     (approximate)   History   Abdominal Pain   HPI  Catherine [name] is a 81 y.o. female with a past medical history of CAD, breast cancer, complete heart block status post pacemaker placement, CHF, DM, HTN, HDL, hypothyroidism, IBS, RA, TAVR 2016 who presents for evaluation of approximately 8 days of some left lower quadrant abdominal pain associated with burning with urination.  She states it radiates to her back.  She think she may have had a fever but is not sure.  She states she had diarrhea about a week ago and has some mucousy stools but no diarrhea in the last couple days.  No blood in her urine.  No right-sided abdominal pain or back pain.  She states she has a chronic cough that is not different than usual today.  No new chest pain, shortness of breath, headache, earache, sore throat or vomiting although she has had some nausea the last couple days.  No other acute concerns at this time.      Physical Exam  Triage Vital Signs: ED Triage Vitals  Enc Vitals Group     BP 01/27/22 1252 (!) 155/53     Pulse Rate 01/27/22 1252 97     Resp 01/27/22 1252 (!) 22     Temp 01/27/22 1252 99.6 F (37.6 C)     Temp Source 01/27/22 1252 Oral     SpO2 01/27/22 1252 98 %     Weight 01/27/22 1253 165 lb (74.8 kg)     Height 01/27/22 1253 5\' 3"  (1.6 m)     Head Circumference --      Peak Flow --      Pain Score 01/27/22 1249 8     Pain Loc --      Pain Edu? --      Excl. in [place]? --     Most recent vital signs: Vitals:   01/27/22 1252 01/27/22 1604  BP: (!) 155/53 (!) 126/91  Pulse: 97 80  Resp: (!) 22 18  Temp: 99.6 F (37.6 C)   SpO2: 98% 96%    General: Awake, no distress.  CV:  Good peripheral perfusion.  2+ radial pulse. Resp:  Normal effort.  Clear bilaterally. Abd:  No distention.  Mildly tender in the left lower quadrant. Other:  Mild left  CVA tenderness.  No overlying skin changes.   ED Results / Procedures / Treatments  Labs (all labs ordered are listed, but only abnormal results are displayed) Labs Reviewed  COMPREHENSIVE METABOLIC PANEL - Abnormal; Notable for the following components:      Result Value   Glucose, Bld 136 (*)    BUN 24 (*)    All other components within normal limits  CBC WITH DIFFERENTIAL/PLATELET - Abnormal; Notable for the following components:   WBC 12.2 (*)    Neutro Abs 8.9 (*)    All other components within normal limits  URINALYSIS, COMPLETE (UACMP) WITH MICROSCOPIC - Abnormal; Notable for the following components:   Color, Urine YELLOW (*)    APPearance CLEAR (*)    Specific Gravity, Urine >1.046 (*)    Bacteria, UA RARE (*)    All other components within normal limits  CULTURE, BLOOD (ROUTINE X 2)  CULTURE, BLOOD (ROUTINE X 2)  LACTIC ACID, PLASMA  PROTIME-INR  LIPASE, BLOOD  APTT  EKG  EKG shows atrial sensed ventricular paced rhythm with a rate of 71 and normal QTc without other significant abnormalities.  Largely similar to ECG from 6/15 4/23.   RADIOLOGY Chest x-ray my interpretation shows chronic appearing scarring in regions without clear focal consolidation, effusion, edema, pneumothorax or other clear acute process.  As reviewed radiologist interpretation and agree with their findings.  CT abdomen pelvis on my interpretation show an acute uncomplicated descending diverticulitis without abscess or perforation.  We do not see evidence of a kidney stone.  No other acute process my interpretation.  I also reviewed radiology's findings and agree with their notation of a stable exophytic right kidney lesion and small midline ventral hernia without other acute abdominal or pelvic process.  PROCEDURES:  Critical Care performed: No  Procedures    MEDICATIONS ORDERED IN ED: Medications  lactated ringers bolus 500 mL (500 mLs Intravenous New Bag/Given 01/27/22 1419)   morphine (PF) 4 MG/ML injection 4 mg (4 mg Intravenous Given 01/27/22 1420)  iohexol (OMNIPAQUE) 300 MG/ML solution 100 mL (100 mLs Intravenous Contrast Given 01/27/22 1445)  cefTRIAXone (ROCEPHIN) 1 g in sodium chloride 0.9 % 100 mL IVPB (0 g Intravenous Stopped 01/27/22 1602)     IMPRESSION / MDM / ASSESSMENT AND PLAN / ED COURSE  I reviewed the triage vital signs and the nursing notes.                              Differential diagnosis includes, but is not limited to atypical ACS, AAA, diverticulitis, kidney stone, pyelonephritis, pancreatitis, metabolic derangements and acute viral process.  Chest x-ray my interpretation shows chronic appearing scarring in regions without clear focal consolidation, effusion, edema, pneumothorax or other clear acute process.  As reviewed radiologist interpretation and agree with their findings.  CT abdomen pelvis on my interpretation show an acute uncomplicated descending diverticulitis without abscess or perforation.  We do not see evidence of a kidney stone.  No other acute process my interpretation.  I also reviewed radiology's findings and agree with their notation of a stable exophytic right kidney lesion and small midline ventral hernia without other acute abdominal or pelvic process.  CMP shows no significant electrolyte or metabolic derangements.  He received a WBC count of 12.2 without evidence of acute anemia.  INR is unremarkable.  Lipase not consistent with acute pancreatitis.  Lactic acid is not elevated.  Patient had some blood culture sent to triage she was slightly tachypneic and had a white count over 12,000.  However patient is overall fairly well-appearing and on several reassessments that she is feeling better and better.  I do not believe she is currently septic.  There is no evidence on CT of perforation or abscess of her diverticulitis which I think is likely the etiology of her symptoms.  I considered observation given her multiple  comorbidities given she is feeling better with stable vitals and low suspicion for sepsis or evidence of perforation I think she is stable for discharge with outpatient follow-up.  Rx written for Augmentin Zofran and Norco.  Discharged in stable condition.  Strict return precautions advised and discussed.      FINAL CLINICAL IMPRESSION(S) / ED DIAGNOSES   Final diagnoses:  Diverticulitis     Rx / DC Orders   ED Discharge Orders          Ordered    amoxicillin-clavulanate (AUGMENTIN) 875-125 MG tablet  2 times daily  01/27/22 1617    HYDROcodone-acetaminophen (NORCO) 5-325 MG tablet  Every 6 hours PRN        01/27/22 1617    ondansetron (ZOFRAN) 4 MG tablet  Every 8 hours PRN        01/27/22 1630             Note:  This document was prepared using Dragon voice recognition software and may include unintentional dictation errors.   Lucrezia Starch, MD 01/27/22 [phone]

## 2022-01-27 NOTE — ED Notes (Signed)
Pt again told of need for urine, pt stated she tried to pee in cup provided but missed the cup not long ago. Pt stated when she feels the need to pee she will let staff know so staff can place hat in commode for her to pee into.

## 2022-01-27 NOTE — ED Notes (Addendum)
RN to bedside. Pt sitting in chair of room. Pt is alert and in no acute distress. Family at bedside.

## 2022-01-27 NOTE — ED Triage Notes (Signed)
Pt with abdominal pain x several days. Pt had endoscopy a month or so ago. Pt states pain had gotten worse over the past 8 days. Pt states pain is worse today. Pt states that she has pain with urinates, pt states urinary frequency, denies blood in urine. Pt states pain is mostly on left side, LLQ, and lower left back. Pt alert and oriented, NAD at this time.

## 2022-01-28 ENCOUNTER — Telehealth: Payer: Self-pay | Admitting: Emergency Medicine

## 2022-01-28 LAB — BLOOD CULTURE ID PANEL (REFLEXED) - BCID2

## 2022-01-28 NOTE — Telephone Encounter (Signed)
Left message on cell phone number in chart for patient to call back.   Called home number listed in the chart, no answer, voice message not available.   Per Dr. Ellender Hose, results from blood culture bottle could be contamination but due to patients age and history, patient needs to come back in for admission.

## 2022-01-28 NOTE — Telephone Encounter (Signed)
Lab called this RN with positive blood culture result. Pt had 1/4 bottles come back positive for Mec A/C, Staph species staph epidermidis, and strep species.  Secure Chat send to EDP Dr. Ellender Hose with results. Awaiting response.

## 2022-01-31 ENCOUNTER — Telehealth (HOSPITAL_BASED_OUTPATIENT_CLINIC_OR_DEPARTMENT_OTHER): Payer: Medicare Other | Admitting: Psychiatry

## 2022-01-31 ENCOUNTER — Encounter (HOSPITAL_COMMUNITY): Payer: Self-pay | Admitting: Psychiatry

## 2022-01-31 ENCOUNTER — Other Ambulatory Visit: Payer: Self-pay

## 2022-01-31 DIAGNOSIS — F419 Anxiety disorder, unspecified: Secondary | ICD-10-CM | POA: Diagnosis not present

## 2022-01-31 DIAGNOSIS — F331 Major depressive disorder, recurrent, moderate: Secondary | ICD-10-CM | POA: Diagnosis not present

## 2022-01-31 LAB — CULTURE, BLOOD (ROUTINE X 2)

## 2022-01-31 MED ORDER — MIRTAZAPINE 15 MG PO TABS: 15.0000 mg | ORAL_TABLET | Freq: Every day | ORAL | 0 refills | Status: DC

## 2022-01-31 MED ORDER — CLONAZEPAM 0.5 MG PO TABS: 0.5000 mg | ORAL_TABLET | Freq: Every day | ORAL | 2 refills | Status: DC

## 2022-01-31 MED ORDER — SERTRALINE HCL 50 MG PO TABS: 125.0000 mg | ORAL_TABLET | Freq: Every day | ORAL | 0 refills | Status: DC
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
## 2022-02-01 LAB — CULTURE, BLOOD (ROUTINE X 2)
Culture: NO GROWTH
Special Requests: ADEQUATE

## 2022-02-05 ENCOUNTER — Emergency Department: Payer: Medicare Other

## 2022-02-05 ENCOUNTER — Encounter: Payer: Self-pay | Admitting: Emergency Medicine

## 2022-02-05 ENCOUNTER — Other Ambulatory Visit: Payer: Self-pay

## 2022-02-05 ENCOUNTER — Emergency Department
Admission: EM | Admit: 2022-02-05 | Discharge: 2022-02-05 | Disposition: A | Discharge status: Home / Self Care | Payer: Medicare Other | Attending: Emergency Medicine | Admitting: Emergency Medicine

## 2022-02-05 DIAGNOSIS — N2889 Other specified disorders of kidney and ureter: Secondary | ICD-10-CM

## 2022-02-05 DIAGNOSIS — R1032 Left lower quadrant pain: Secondary | ICD-10-CM

## 2022-02-05 DIAGNOSIS — K439 Ventral hernia without obstruction or gangrene: Secondary | ICD-10-CM

## 2022-02-05 DIAGNOSIS — I110 Hypertensive heart disease with heart failure: Secondary | ICD-10-CM | POA: Diagnosis not present

## 2022-02-05 DIAGNOSIS — K5732 Diverticulitis of large intestine without perforation or abscess without bleeding: Secondary | ICD-10-CM | POA: Insufficient documentation

## 2022-02-05 DIAGNOSIS — I509 Heart failure, unspecified: Secondary | ICD-10-CM | POA: Diagnosis not present

## 2022-02-05 DIAGNOSIS — E119 Type 2 diabetes mellitus without complications: Secondary | ICD-10-CM | POA: Insufficient documentation

## 2022-02-05 DIAGNOSIS — K5792 Diverticulitis of intestine, part unspecified, without perforation or abscess without bleeding: Secondary | ICD-10-CM | POA: Diagnosis not present

## 2022-02-05 LAB — COMPREHENSIVE METABOLIC PANEL
ALT: 12 U/L (ref 0–44)
AST: 20 U/L (ref 15–41)
Albumin: 4 g/dL (ref 3.5–5.0)
Alkaline Phosphatase: 50 U/L (ref 38–126)
Anion gap: 7 (ref 5–15)
BUN: 25 mg/dL — ABNORMAL HIGH (ref 8–23)
CO2: 27 mmol/L (ref 22–32)
Calcium: 9.7 mg/dL (ref 8.9–10.3)
Chloride: 104 mmol/L (ref 98–111)
Creatinine, Ser: 0.88 mg/dL (ref 0.44–1.00)
GFR, Estimated: >60 mL/min (ref >60)
Glucose, Bld: 108 mg/dL — ABNORMAL HIGH (ref 70–99)
Potassium: 4.5 mmol/L (ref 3.5–5.1)
Sodium: 138 mmol/L (ref 135–145)
Total Bilirubin: 0.5 mg/dL (ref 0.3–1.2)
Total Protein: 7.5 g/dL (ref 6.5–8.1)

## 2022-02-05 LAB — URINALYSIS, COMPLETE (UACMP) WITH MICROSCOPIC
Bacteria, UA: NONE SEEN
Bilirubin Urine: NEGATIVE
Glucose, UA: NEGATIVE mg/dL
Hgb urine dipstick: NEGATIVE
Ketones, ur: NEGATIVE mg/dL
Leukocytes,Ua: NEGATIVE
Nitrite: NEGATIVE
Protein, ur: NEGATIVE mg/dL
Specific Gravity, Urine: 1.026 (ref 1.005–1.030)
pH: 5 (ref 5.0–8.0)

## 2022-02-05 LAB — CBC
HCT: 39.8 % (ref 36.0–46.0)
Hemoglobin: 12.6 g/dL (ref 12.0–15.0)
MCH: 28.3 pg (ref 26.0–34.0)
MCHC: 31.7 g/dL (ref 30.0–36.0)
MCV: 89.2 fL (ref 80.0–100.0)
Platelets: 296 10*3/uL (ref 150–400)
RBC: 4.46 MIL/uL (ref 3.87–5.11)
RDW: 13 % (ref 11.5–15.5)
WBC: 10 10*3/uL (ref 4.0–10.5)
nRBC: 0 % (ref 0.0–0.2)

## 2022-02-05 LAB — LIPASE, BLOOD: Lipase: 44 U/L (ref 11–51)

## 2022-02-05 MED ORDER — FENTANYL CITRATE PF 50 MCG/ML IJ SOSY
25.0000 ug | PREFILLED_SYRINGE | Freq: Once | INTRAMUSCULAR | Status: AC
  Administered 2022-02-05: 25 ug via INTRAVENOUS
  Filled 2022-02-05: qty 1

## 2022-02-05 MED ORDER — ACETAMINOPHEN 500 MG PO TABS
1000.0000 mg | ORAL_TABLET | Freq: Once | ORAL | Status: AC
  Administered 2022-02-05: 1000 mg via ORAL
  Filled 2022-02-05: qty 2

## 2022-02-05 MED ORDER — IOHEXOL 300 MG/ML  SOLN
100.0000 mL | Freq: Once | INTRAMUSCULAR | Status: AC | PRN
  Administered 2022-02-05: 100 mL via INTRAVENOUS
  Filled 2022-02-05: qty 100

## 2022-02-05 MED ORDER — DICYCLOMINE HCL 10 MG PO CAPS
10.0000 mg | ORAL_CAPSULE | Freq: Once | ORAL | Status: AC
  Administered 2022-02-05: 10 mg via ORAL
  Filled 2022-02-05: qty 1

## 2022-02-05 MED ORDER — DICYCLOMINE HCL 10 MG PO CAPS: 10.0000 mg | ORAL_CAPSULE | Freq: Three times a day (TID) | ORAL | 0 refills | Status: DC

## 2022-02-05 NOTE — ED Triage Notes (Signed)
Presents with abd pain  states she was seen for same last week  dx'd with diverticulitis  states pain is not any better ?

## 2022-02-05 NOTE — ED Provider Notes (Signed)
? ?[facility] ?Provider Note ? ? ? Event Date/Time  ? First MD Initiated Contact with Patient 02/05/22 1238   ?  (approximate) ? ? ?History  ?Abdominal pain ? ? ?HPI ? ?Catherine [name] is a 81 y.o. female with history of diabetes, hypertension, CHF who presents with complaints of left lower quadrant abdominal pain.  Patient reports that she was diagnosed with diverticulitis 1 week ago in the emergency department, has been taking her antibiotics as prescribed but reports symptoms have not improved significantly.  She denies fevers or chills.  Continues to have pain in the left lower quadrant ?  ? ? ?Physical Exam  ? ?Triage Vital Signs: ?ED Triage Vitals  ?Enc Vitals Group  ?   BP 02/05/22 1241 (!) 151/81  ?   Pulse Rate 02/05/22 1204 (!) 104  ?   Resp 02/05/22 1241 20  ?   Temp 02/05/22 1241 98.9 ?F (37.2 ?C)  ?   Temp Source 02/05/22 1241 Oral  ?   SpO2 02/05/22 1204 99 %  ?   Weight 02/05/22 1251 74.8 kg (164 lb 14.5 oz)  ?   Height 02/05/22 1251 1.6 m ('5\' 3"'$ )  ?   Head Circumference --   ?   Peak Flow --   ?   Pain Score --   ?   Pain Loc --   ?   Pain Edu? --   ?   Excl. in [place]? --   ? ? ?Most recent vital signs: ?Vitals:  ? 02/05/22 1241 02/05/22 1403  ?BP: (!) 151/81 (!) 148/78  ?Pulse: 97 88  ?Resp: 20 20  ?Temp: 98.9 ?F (37.2 ?C)   ?SpO2: 96% 97%  ? ? ? ?General: Awake, no distress.  ?CV:  Good peripheral perfusion.  ?Resp:  Normal effort.  ?Abd:  No distention.  Mild tenderness left lower quadrant, mild CVA tenderness ?Other:   ? ? ?ED Results / Procedures / Treatments  ? ?Labs ?(all labs ordered are listed, but only abnormal results are displayed) ?Labs Reviewed  ?COMPREHENSIVE METABOLIC PANEL - Abnormal; Notable for the following components:  ?    Result Value  ? Glucose, Bld 108 (*)   ? BUN 25 (*)   ? All other components within normal limits  ?URINALYSIS, COMPLETE (UACMP) WITH MICROSCOPIC - Abnormal; Notable for the following components:  ? Color, Urine YELLOW (*)   ? APPearance  CLOUDY (*)   ? All other components within normal limits  ?CBC  ?LIPASE, BLOOD  ? ? ? ?EKG ? ? ? ? ?RADIOLOGY ?CT abdomen pelvis viewed and interpreted by me, no acute or maladies noted, pending radiology read ? ? ? ?PROCEDURES: ? ?Critical Care performed:  ? ?Procedures ? ? ?MEDICATIONS ORDERED IN ED: ?Medications  ?fentaNYL (SUBLIMAZE) injection 25 mcg (25 mcg Intravenous Given 02/05/22 1425)  ?iohexol (OMNIPAQUE) 300 MG/ML solution 100 mL (100 mLs Intravenous Contrast Given 02/05/22 1509)  ? ? ? ?IMPRESSION / MDM / ASSESSMENT AND PLAN / ED COURSE  ?I reviewed the triage vital signs and the nursing notes. ? ? ?Patient with a history of diabetes and diverticulitis presents with continued pain.  Reviewed ED visit from last week, did have a CT scan which demonstrated uncomplicated diverticulitis in the descending colon.  She continues to have pain in this area but also reports some dysuria she has mild CVA tenderness.  Differential includes diverticulitis, possible abscess, UTI/Pilo ? ?We will obtain labs, urinalysis give IV morphine and IV Zofran  for pain as needed ? ?Lab work is reassuring, normal white blood cell count, normal CMP ?, Urinalysis unremarkable ? ?Pending repeat CT scan ? ?Have asked my colleague to follow-up on CT results ? ? ? ?  ? ? ?FINAL CLINICAL IMPRESSION(S) / ED DIAGNOSES  ? ?Final diagnoses:  ?Left lower quadrant abdominal pain  ?Diverticulitis  ? ? ? ?Rx / DC Orders  ? ?ED Discharge Orders   ? ? None  ? ?  ? ? ? ?Note:  This document was prepared using Dragon voice recognition software and may include unintentional dictation errors. ?  ?Lavonia Drafts, MD ?02/05/22 1536 ? ?

## 2022-02-05 NOTE — ED Notes (Signed)
Urine sent down to lab

## 2022-02-05 NOTE — ED Provider Notes (Signed)
I assumed care of this patient approximate 1500.  Please see office vitals note for full details regarding patient's initial evaluation assessment.  In brief patient presents for assessment of some persistent lower quadrant abdominal pain.  She was seen by this examiner on 2/25 and diagnosed with diverticulitis based on CT and states she completed a antibiotics that were prescribed but does not feel much better.  She denies any new symptoms bleeding measured fevers, vomiting, diarrhea, constipation, urinary symptoms or any other clear associated symptoms. ? ?Initial labs including CBC, CMP and UA are all reassuring.  Patient with stable vitals.  Plan is to follow-up CT and reassess. ? ?CT on m valuation shows no evidence of ongoing diverticulitis, abscess, perforation, SBO or other clear acute process in left lower quadrant.  I also reviewed radiology interpretation and agree with the findings of resolution of previously noted diverticulitis and stable right exophytic lesion in the right kidney as well as a ventral midline hernia.  Of note on exam on my reassessment this is easily reproducible I think not contributing to symptoms today. ? ?Overall unclear etiology for patient's discomfort although given stable vitals with otherwise reassuring exam and work-up in CT with patient tolerating p.o. and not reporting any diarrhea or constipation or urinary symptoms I think she is stable for discharge with outpatient follow-up.  We will trial some Bentyl.  Discharged in stable condition.  Strict return precautions advised and discussed ?  ?Lucrezia Starch, MD ?02/05/22 1717 ? ?

## 2022-02-05 NOTE — Discharge Instructions (Addendum)
Your CT today showed: ?IMPRESSION: ?1. Near complete resolution of descending colon diverticulitis.Marland Kitchen ?2. Stable high-density exophytic lesion extending from the RIGHT ?kidney. ?3. Midline ventral hernia contains short segment nonobstructed ?transverse colon.  ?

## 2022-02-14 ENCOUNTER — Other Ambulatory Visit (HOSPITAL_COMMUNITY): Payer: Self-pay | Admitting: Psychiatry

## 2022-02-14 DIAGNOSIS — F331 Major depressive disorder, recurrent, moderate: Secondary | ICD-10-CM

## 2022-02-14 DIAGNOSIS — F419 Anxiety disorder, unspecified: Secondary | ICD-10-CM

## 2022-02-17 DIAGNOSIS — K5792 Diverticulitis of intestine, part unspecified, without perforation or abscess without bleeding: Secondary | ICD-10-CM | POA: Diagnosis not present

## 2022-02-28 ENCOUNTER — Telehealth: Payer: Self-pay

## 2022-02-28 NOTE — Telephone Encounter (Signed)
I returned her daughter's phone call that she was not present and I spoke to the patient.  I explained that she is taking mirtazapine for a while and usually psychotropic medication because suicidal thoughts in the beginning of treatment.  She understand and acknowledge.  If she does not have any suicidal thoughts I recommend to keep the medication and we will discuss more on her next appointment in May.  However if daughter wants to call back then she can call us and we can clarify. ?

## 2022-02-28 NOTE — Telephone Encounter (Signed)
Patient's daughter called regarding her Mirtazapine '15mg'$ . She stated that the patient has been on it for 2 years but pt's husband read that it causes suicidal thoughts so daughter stated that pt's husband stopped it and wants to have her put on something else. Daughter stated that pt has been off it for a few days. Daughter [name]) wants you to call pt or the daughter regarding this. Pt's phone number is [phone]. The daughter's phone number is [phone]. Please review and advise. Thank you ?

## 2022-03-06 ENCOUNTER — Emergency Department: Payer: Medicare Other

## 2022-03-06 ENCOUNTER — Observation Stay
Admission: EM | Admit: 2022-03-06 | Discharge: 2022-03-08 | Disposition: A | Discharge status: Home / Self Care | Payer: Medicare Other | Attending: Internal Medicine | Admitting: Internal Medicine

## 2022-03-06 ENCOUNTER — Other Ambulatory Visit: Payer: Self-pay

## 2022-03-06 DIAGNOSIS — E039 Hypothyroidism, unspecified: Secondary | ICD-10-CM | POA: Diagnosis present

## 2022-03-06 DIAGNOSIS — Z794 Long term (current) use of insulin: Secondary | ICD-10-CM | POA: Diagnosis not present

## 2022-03-06 DIAGNOSIS — I5032 Chronic diastolic (congestive) heart failure: Secondary | ICD-10-CM | POA: Diagnosis present

## 2022-03-06 DIAGNOSIS — Z7984 Long term (current) use of oral hypoglycemic drugs: Secondary | ICD-10-CM | POA: Diagnosis not present

## 2022-03-06 DIAGNOSIS — R350 Frequency of micturition: Secondary | ICD-10-CM | POA: Diagnosis present

## 2022-03-06 DIAGNOSIS — I110 Hypertensive heart disease with heart failure: Secondary | ICD-10-CM | POA: Diagnosis not present

## 2022-03-06 DIAGNOSIS — Z853 Personal history of malignant neoplasm of breast: Secondary | ICD-10-CM | POA: Diagnosis not present

## 2022-03-06 DIAGNOSIS — I10 Essential (primary) hypertension: Secondary | ICD-10-CM | POA: Diagnosis present

## 2022-03-06 DIAGNOSIS — R0789 Other chest pain: Secondary | ICD-10-CM | POA: Diagnosis not present

## 2022-03-06 DIAGNOSIS — Z91040 Latex allergy status: Secondary | ICD-10-CM | POA: Diagnosis not present

## 2022-03-06 DIAGNOSIS — I25110 Atherosclerotic heart disease of native coronary artery with unstable angina pectoris: Secondary | ICD-10-CM | POA: Insufficient documentation

## 2022-03-06 DIAGNOSIS — I200 Unstable angina: Secondary | ICD-10-CM | POA: Diagnosis not present

## 2022-03-06 DIAGNOSIS — Z950 Presence of cardiac pacemaker: Secondary | ICD-10-CM | POA: Insufficient documentation

## 2022-03-06 DIAGNOSIS — Z79899 Other long term (current) drug therapy: Secondary | ICD-10-CM | POA: Diagnosis not present

## 2022-03-06 DIAGNOSIS — E119 Type 2 diabetes mellitus without complications: Secondary | ICD-10-CM | POA: Diagnosis not present

## 2022-03-06 DIAGNOSIS — R0602 Shortness of breath: Secondary | ICD-10-CM | POA: Diagnosis not present

## 2022-03-06 DIAGNOSIS — R079 Chest pain, unspecified: Secondary | ICD-10-CM | POA: Diagnosis not present

## 2022-03-06 DIAGNOSIS — F32A Depression, unspecified: Secondary | ICD-10-CM | POA: Diagnosis present

## 2022-03-06 LAB — CBC
HCT: 38.4 % (ref 36.0–46.0)
Hemoglobin: 12.3 g/dL (ref 12.0–15.0)
MCH: 28.4 pg (ref 26.0–34.0)
MCHC: 32 g/dL (ref 30.0–36.0)
MCV: 88.7 fL (ref 80.0–100.0)
Platelets: 300 10*3/uL (ref 150–400)
RBC: 4.33 MIL/uL (ref 3.87–5.11)
RDW: 13.4 % (ref 11.5–15.5)
WBC: 9.1 10*3/uL (ref 4.0–10.5)
nRBC: 0 % (ref 0.0–0.2)

## 2022-03-06 LAB — BASIC METABOLIC PANEL
Anion gap: 9 (ref 5–15)
BUN: 19 mg/dL (ref 8–23)
CO2: 27 mmol/L (ref 22–32)
Calcium: 9.5 mg/dL (ref 8.9–10.3)
Chloride: 102 mmol/L (ref 98–111)
Creatinine, Ser: 0.87 mg/dL (ref 0.44–1.00)
GFR, Estimated: >60 mL/min (ref >60)
Glucose, Bld: 89 mg/dL (ref 70–99)
Potassium: 4.2 mmol/L (ref 3.5–5.1)
Sodium: 138 mmol/L (ref 135–145)

## 2022-03-06 LAB — TROPONIN I (HIGH SENSITIVITY)
Troponin I (High Sensitivity): 8 ng/L (ref <18)
Troponin I (High Sensitivity): 7 ng/L (ref <18)

## 2022-03-06 NOTE — ED Notes (Signed)
To Triage 2 for repeat vitals and trop. Pt reports no improvement in sob, but chest pain is somewhat improved. ?

## 2022-03-06 NOTE — ED Triage Notes (Signed)
Pt comes with c/o left sided CP that started yesterday. Pt states SOb. Pt states radiation to neck and arm/ ?

## 2022-03-07 ENCOUNTER — Encounter: Payer: Self-pay | Admitting: Family Medicine

## 2022-03-07 DIAGNOSIS — R079 Chest pain, unspecified: Secondary | ICD-10-CM

## 2022-03-07 DIAGNOSIS — Z950 Presence of cardiac pacemaker: Secondary | ICD-10-CM | POA: Diagnosis not present

## 2022-03-07 DIAGNOSIS — I2510 Atherosclerotic heart disease of native coronary artery without angina pectoris: Secondary | ICD-10-CM

## 2022-03-07 DIAGNOSIS — E039 Hypothyroidism, unspecified: Secondary | ICD-10-CM | POA: Diagnosis not present

## 2022-03-07 DIAGNOSIS — R350 Frequency of micturition: Secondary | ICD-10-CM | POA: Diagnosis not present

## 2022-03-07 DIAGNOSIS — R072 Precordial pain: Secondary | ICD-10-CM | POA: Diagnosis not present

## 2022-03-07 DIAGNOSIS — F419 Anxiety disorder, unspecified: Secondary | ICD-10-CM | POA: Diagnosis not present

## 2022-03-07 DIAGNOSIS — F32A Depression, unspecified: Secondary | ICD-10-CM | POA: Diagnosis present

## 2022-03-07 DIAGNOSIS — E119 Type 2 diabetes mellitus without complications: Secondary | ICD-10-CM | POA: Diagnosis not present

## 2022-03-07 LAB — CBC
HCT: 36.1 % (ref 36.0–46.0)
Hemoglobin: 11.3 g/dL — ABNORMAL LOW (ref 12.0–15.0)
MCH: 28.3 pg (ref 26.0–34.0)
MCHC: 31.3 g/dL (ref 30.0–36.0)
MCV: 90.3 fL (ref 80.0–100.0)
Platelets: 260 10*3/uL (ref 150–400)
RBC: 4 MIL/uL (ref 3.87–5.11)
RDW: 13.4 % (ref 11.5–15.5)
WBC: 10 10*3/uL (ref 4.0–10.5)
nRBC: 0 % (ref 0.0–0.2)

## 2022-03-07 LAB — BASIC METABOLIC PANEL
Anion gap: 8 (ref 5–15)
BUN: 18 mg/dL (ref 8–23)
CO2: 25 mmol/L (ref 22–32)
Calcium: 9 mg/dL (ref 8.9–10.3)
Chloride: 103 mmol/L (ref 98–111)
Creatinine, Ser: 0.84 mg/dL (ref 0.44–1.00)
GFR, Estimated: >60 mL/min (ref >60)
Glucose, Bld: 110 mg/dL — ABNORMAL HIGH (ref 70–99)
Potassium: 3.6 mmol/L (ref 3.5–5.1)
Sodium: 136 mmol/L (ref 135–145)

## 2022-03-07 LAB — BRAIN NATRIURETIC PEPTIDE: B Natriuretic Peptide: 47 pg/mL (ref 0.0–100.0)

## 2022-03-07 LAB — CBG MONITORING, ED
Glucose-Capillary: 97 mg/dL (ref 70–99)
Glucose-Capillary: 104 mg/dL — ABNORMAL HIGH (ref 70–99)
Glucose-Capillary: 111 mg/dL — ABNORMAL HIGH (ref 70–99)

## 2022-03-07 LAB — GLUCOSE, CAPILLARY: Glucose-Capillary: 113 mg/dL — ABNORMAL HIGH (ref 70–99)

## 2022-03-07 MED ORDER — MAGNESIUM HYDROXIDE 400 MG/5ML PO SUSP: 30.0000 mL | Freq: Every day | ORAL | Status: DC | PRN

## 2022-03-07 MED ORDER — GLIPIZIDE ER 5 MG PO TB24: 5.0000 mg | ORAL_TABLET | Freq: Every day | ORAL | Status: DC

## 2022-03-07 MED ORDER — METOPROLOL TARTRATE 25 MG PO TABS: 25.0000 mg | ORAL_TABLET | Freq: Two times a day (BID) | ORAL | Status: DC

## 2022-03-07 MED ORDER — LOSARTAN POTASSIUM 50 MG PO TABS: 50.0000 mg | ORAL_TABLET | Freq: Every day | ORAL | Status: DC

## 2022-03-07 MED ORDER — ISOSORBIDE MONONITRATE ER 30 MG PO TB24
15.0000 mg | ORAL_TABLET | Freq: Every day | ORAL | Status: DC
Start: 2022-03-07 — End: 2022-03-08
  Administered 2022-03-07 – 2022-03-08 (×2): 15 mg via ORAL
  Filled 2022-03-07 (×2): qty 1

## 2022-03-07 MED ORDER — OXYBUTYNIN CHLORIDE ER 10 MG PO TB24
10.0000 mg | ORAL_TABLET | Freq: Every day | ORAL | Status: DC
  Administered 2022-03-08: 10 mg via ORAL
  Filled 2022-03-07: qty 1

## 2022-03-07 MED ORDER — BUDESONIDE 0.5 MG/2ML IN SUSP
2.0000 mL | Freq: Two times a day (BID) | RESPIRATORY_TRACT | Status: DC
  Administered 2022-03-07 – 2022-03-08 (×3): 0.5 mg via RESPIRATORY_TRACT
  Filled 2022-03-07 (×2): qty 2

## 2022-03-07 MED ORDER — ACETAMINOPHEN 650 MG RE SUPP: 650.0000 mg | Freq: Four times a day (QID) | RECTAL | Status: DC | PRN

## 2022-03-07 MED ORDER — ACETAMINOPHEN 325 MG PO TABS
650.0000 mg | ORAL_TABLET | Freq: Four times a day (QID) | ORAL | Status: DC | PRN
  Administered 2022-03-07: 650 mg via ORAL
  Filled 2022-03-07 (×2): qty 2

## 2022-03-07 MED ORDER — NITROGLYCERIN 0.4 MG SL SUBL
0.4000 mg | SUBLINGUAL_TABLET | SUBLINGUAL | Status: DC | PRN
  Filled 2022-03-07: qty 1

## 2022-03-07 MED ORDER — CLONAZEPAM 0.5 MG PO TABS
0.5000 mg | ORAL_TABLET | Freq: Every day | ORAL | Status: DC
  Administered 2022-03-07: 0.5 mg via ORAL
  Filled 2022-03-07: qty 1

## 2022-03-07 MED ORDER — POTASSIUM CHLORIDE CRYS ER 10 MEQ PO TBCR
5.0000 meq | EXTENDED_RELEASE_TABLET | Freq: Every day | ORAL | Status: DC
  Administered 2022-03-07 – 2022-03-08 (×2): 5 meq via ORAL
  Filled 2022-03-07 (×2): qty 1

## 2022-03-07 MED ORDER — CLONAZEPAM 0.5 MG PO TABS
0.5000 mg | ORAL_TABLET | Freq: Once | ORAL | Status: AC
  Administered 2022-03-07: 0.5 mg via ORAL
  Filled 2022-03-07: qty 1

## 2022-03-07 MED ORDER — TRAZODONE HCL 50 MG PO TABS
50.0000 mg | ORAL_TABLET | Freq: Every evening | ORAL | Status: DC | PRN
  Administered 2022-03-07 (×2): 50 mg via ORAL
  Filled 2022-03-07 (×2): qty 1

## 2022-03-07 MED ORDER — SODIUM CHLORIDE 0.9 % IV SOLN: INTRAVENOUS | Status: DC

## 2022-03-07 MED ORDER — INSULIN GLARGINE 100 UNIT/ML SOLOSTAR PEN: 16.0000 [IU] | PEN_INJECTOR | Freq: Every day | SUBCUTANEOUS | Status: DC

## 2022-03-07 MED ORDER — NITROGLYCERIN 2 % TD OINT
0.5000 [in_us] | TOPICAL_OINTMENT | Freq: Once | TRANSDERMAL | Status: AC
  Administered 2022-03-07: 0.5 [in_us] via TOPICAL
  Filled 2022-03-07: qty 1

## 2022-03-07 MED ORDER — LOSARTAN POTASSIUM 25 MG PO TABS
25.0000 mg | ORAL_TABLET | Freq: Every day | ORAL | Status: DC
  Administered 2022-03-08: 25 mg via ORAL
  Filled 2022-03-07: qty 1

## 2022-03-07 MED ORDER — FUROSEMIDE 20 MG PO TABS: 20.0000 mg | ORAL_TABLET | ORAL | Status: DC | PRN

## 2022-03-07 MED ORDER — INSULIN ASPART 100 UNIT/ML IJ SOLN: 0.0000 [IU] | Freq: Three times a day (TID) | INTRAMUSCULAR | Status: DC

## 2022-03-07 MED ORDER — TRAZODONE HCL 50 MG PO TABS
50.0000 mg | ORAL_TABLET | Freq: Once | ORAL | Status: AC
  Administered 2022-03-07: 50 mg via ORAL
  Filled 2022-03-07: qty 1

## 2022-03-07 MED ORDER — LEVOTHYROXINE SODIUM 112 MCG PO TABS
112.0000 ug | ORAL_TABLET | Freq: Every day | ORAL | Status: DC
  Administered 2022-03-07 – 2022-03-08 (×2): 112 ug via ORAL
  Filled 2022-03-07 (×2): qty 1

## 2022-03-07 MED ORDER — DICYCLOMINE HCL 10 MG PO CAPS: 10.0000 mg | ORAL_CAPSULE | Freq: Three times a day (TID) | ORAL | Status: DC

## 2022-03-07 MED ORDER — ENSURE ENLIVE PO LIQD
237.0000 mL | Freq: Three times a day (TID) | ORAL | Status: DC
  Administered 2022-03-07 – 2022-03-08 (×3): 237 mL via ORAL

## 2022-03-07 MED ORDER — ENOXAPARIN SODIUM 40 MG/0.4ML IJ SOSY
40.0000 mg | PREFILLED_SYRINGE | INTRAMUSCULAR | Status: DC
  Administered 2022-03-07 – 2022-03-08 (×2): 40 mg via SUBCUTANEOUS
  Filled 2022-03-07 (×2): qty 0.4

## 2022-03-07 MED ORDER — SERTRALINE HCL 50 MG PO TABS
125.0000 mg | ORAL_TABLET | Freq: Every day | ORAL | Status: DC
  Administered 2022-03-07 – 2022-03-08 (×2): 125 mg via ORAL
  Filled 2022-03-07 (×2): qty 3

## 2022-03-07 MED ORDER — METOCLOPRAMIDE HCL 5 MG/ML IJ SOLN: 10.0000 mg | Freq: Four times a day (QID) | INTRAMUSCULAR | Status: DC | PRN

## 2022-03-07 MED ORDER — ATORVASTATIN CALCIUM 20 MG PO TABS
40.0000 mg | ORAL_TABLET | Freq: Every day | ORAL | Status: DC
  Administered 2022-03-07 – 2022-03-08 (×2): 40 mg via ORAL
  Filled 2022-03-07 (×2): qty 2

## 2022-03-07 MED ORDER — ASPIRIN 81 MG PO CHEW
324.0000 mg | CHEWABLE_TABLET | Freq: Once | ORAL | Status: AC
  Administered 2022-03-07: 324 mg via ORAL
  Filled 2022-03-07: qty 4

## 2022-03-07 MED ORDER — PRO-STAT SUGAR FREE PO LIQD
30.0000 mL | Freq: Three times a day (TID) | ORAL | Status: DC
  Administered 2022-03-07 – 2022-03-08 (×3): 30 mL via ORAL

## 2022-03-07 MED ORDER — MAGNESIUM OXIDE 400 MG PO TABS
400.0000 mg | ORAL_TABLET | Freq: Every day | ORAL | Status: DC
  Administered 2022-03-07 – 2022-03-08 (×2): 400 mg via ORAL
  Filled 2022-03-07 (×4): qty 1

## 2022-03-07 MED ORDER — MIRTAZAPINE 15 MG PO TABS
15.0000 mg | ORAL_TABLET | Freq: Every day | ORAL | Status: DC
  Administered 2022-03-07: 15 mg via ORAL
  Filled 2022-03-07: qty 1

## 2022-03-07 NOTE — Assessment & Plan Note (Addendum)
Urinalysis unremarkable 

## 2022-03-07 NOTE — Assessment & Plan Note (Addendum)
-   We will continue her antihypertensives.  Imdur added to her regimen ?

## 2022-03-07 NOTE — Assessment & Plan Note (Signed)
-   We will place her on supplement coverage with NovoLog. 

## 2022-03-07 NOTE — Consult Note (Signed)
?Cardiology Consultation:  ? ?Patient ID: [name] ?MRN: [id number]; DOB: 09/08/1941 ? ?Admit date: 03/06/2022 ?Date of Consult: 03/07/2022 ? ?PCP:  Danelle Berry, NP ?  ?[facility] Providers ?Cardiologist:  Ida Rogue, MD  ?Electrophysiologist:  Vickie Epley, MD  { ? ? ?Patient Profile:  ? ?TYKERA SKATES is a 81 y.o. female with a hx of CAD medically managed, AS s/p TAV 05/5783 complicated by CHB s/p PPM complicated by hemothorax requiring chest pain, hypercholesterolemia, TIA, CVA, chronic diastolic CHF, venous insufficiency, DM2, HTN, HLD, anemia, hypothyroidism, RA, IBS, chronic nausea, and obesity who is being seen 03/07/2022 for the evaluation of chest pain at the request of Dr. Sidney Ace. ? ?History of Present Illness:  ? ?Ms. Wardlow is followed by Dr. Rockey Situ for the above cardiac isues. She underwent cardiac catheterization in 08/2015 which showed single vessel CAD with heavy calcification of the entire LAD and a moderate eccentric mid LAD stenosis estimated at 70%. There was mild nonobstructive stenosis of the Lcx and RCA. She had a severely calcified aortic valve with restricted mobility and 2+ AI with known severe AS by prior noninvasive evaluation. Medical management was advised for CAD along with treatment of her aortic valve disease. She underwent TAVR in 05/9628 which was complicated by CHB requiring PPM. Her PPM implantation was complicated by a hemothorax requiring chest tube placement. Echo 03/2017 showed EF 60-65%, no WMA, G1DD and normal functioning valve without stenosis or regurgitation.  ? ?Admitted 10/2018 pre-syncope and she felt the pacemaker was not functioning correctly, however the reps interrogated device and there was no malfunction. She had 2 brief Atach episodes. Echo showed EF 50 to 55%, no regional wall motion abnormalities, mild concentric LVH, grade 1 diastolic dysfunction, a bioprosthetic aortic valve was present with a mean gradient of 6 mmHg, moderately dilated left  atrium, RV systolic function was normal, mildly dilated right atrium, mild tricuspid regurgitation, PASP normal. ? ?She established with EP in [place]  and was last seen 03/17/21.  ? ?The patient presented for chest pain. Pan started Monday. Pain was in the left side of the chest. It did not radiate. Nothing made it worse. It was intermittent. She had severe anxiety before the chest pain, said might have been a panic attack. Heart was pounding. Took SL NTG x2 and this helped. She came to the ER when the pain didn't resolve. Denies Lle, orthopnea, pnd, fever, chills.  ? ?In the ER HS trop 8>7. Lab showed K 4.2, CO2 27, Scr 0.87, BUN 19, WBC 9.1, Hgb 12.3. EKG with A-sensed V paced rhythm, 85bpm. CXR non-acute. She was given 4 baby ASA, klonopin and 1 inch Nitropaste with improvement of chest pain.  ? ? ?Past Medical History:  ?Diagnosis Date  ? Acute colitis   ? Anemia   ? CAD (coronary artery disease)   ? a. heavy calcification of the entire LAD & mod eccentric mid LAD stenosis, estimated @ 70%, mild nonobs stenosis of RCA and LCx, severely calcified Ao valve with restricted valve mobility and 2+ AI    ? Cancer [facility]) 1981  ? breast  ? CHB (complete heart block) ([place]) 08/31/2015  ? [place] (serial number  [id number] )   ? Chronic diastolic congestive heart failure ([place])   ? a. echo 08/31/2015: EF 65-70%, nl WM, GR1DD, Ao valve stent bioprosthesis was present and functioning nl, no regurg, LA mildly dilated, PASP 46 mm Hg  ? Collagen vascular disease (  HCC)   ? Colon polyps   ? Depression   ? Diabetes mellitus type II   ? Fibromyalgia   ? HTN (hypertension)   ? Hypercholesteremia   ? Hypothyroidism   ? IBS (irritable bowel syndrome)   ? Presence of permanent cardiac pacemaker   ? Rectal bleeding   ? Rheumatoid arthritis(714.0)   ? S/P TAVR (transcatheter aortic valve replacement) 08/30/2015  ? 26 mm Edwards Sapien 3 transcatheter heart valve placed via open right transfemoral approach  ?  Shortness of breath dyspnea   ? Stenosis of aortic valve   ? Suicide attempt [facility])   ? Thyroid disease   ? ? ?Past Surgical History:  ?Procedure Laterality Date  ? ABDOMINAL SURGERY    ? Intestinal surgery  ? CARDIAC SURGERY    ? CESAREAN SECTION    ? x 2  ? EP IMPLANTABLE DEVICE N/A 08/31/2015  ? Procedure: Pacemaker Implant;  Surgeon: Will Meredith Leeds, MD; [place] (serial number  [id number] ); Laterality: Right  ? ESOPHAGOGASTRODUODENOSCOPY (EGD) WITH PROPOFOL N/A 04/04/2021  ? Procedure: ESOPHAGOGASTRODUODENOSCOPY (EGD) WITH PROPOFOL;  Surgeon: Jonathon Bellows, MD;  Location[facility] END[facility]COPY;  Service: Gastroenterology;  Laterality: N/A;  ? LAPAROSCOPY N/A 01/31/2020  ? Procedure: LAPAROSCOPY DIAGNOSTIC;  Surgeon: Kieth Brightly Arta Bruce, MD;  Location: [place];  Service: General;  Laterality: N/A;  ? LAPAROTOMY N/A 01/31/2020  ? Procedure: Exploratory Laparotomy;  Surgeon: Kinsinger, Arta Bruce, MD;  Location: [place];  Service: General;  Laterality: N/A;  ? LEFT OOPHORECTOMY    ? LYSIS OF ADHESION  01/31/2020  ? Procedure: Lysis Of Adhesions, Ligation of Falciform Vein;  Surgeon: Kinsinger, Arta Bruce, MD;  Location: [place];  Service: General;;  ? MASTECTOMY Left   ? polyp     ? self inflicted chest wound    ? [place]  ? lower back  ? TEE WITHOUT CARDIOVERSION N/A 08/30/2015  ? Procedure: TRANSESOPHAGEAL ECHOCARDIOGRAM (TEE);  Surgeon: Sherren Mocha, MD;  Location: [place];  Service: Open Heart Surgery;  Laterality: N/A;  ? TOTAL ABDOMINAL HYSTERECTOMY    ? TRANSCATHETER AORTIC VALVE REPLACEMENT, TRANSFEMORAL N/A 08/30/2015  ? Procedure: TRANSCATHETER AORTIC VALVE REPLACEMENT, TRANSFEMORAL;  Surgeon: Sherren Mocha, MD;  Location: [place];  Service: Open Heart Surgery;  Laterality: N/A;  ?  ? ?Home Medications:  ?Prior to Admission medications   ?Medication Sig Start Date End Date Taking? Authorizing Provider  ?acetaminophen (TYLENOL) 500 MG tablet Take 1,000 mg by mouth every 6 (six)  hours as needed for mild pain.    [provider]  ?Amino Acids-Protein Hydrolys (FEEDING SUPPLEMENT, PRO-STAT SUGAR FREE 64,) LIQD Take 30 mLs by mouth 3 (three) times daily. 02/16/20   Meuth, Brooke A, PA-C  ?atorvastatin (LIPITOR) 40 MG tablet Take 1 tablet by mouth daily. 03/21/21   [provider]  ?budesonide (PULMICORT) 0.5 MG/2ML nebulizer solution Take 2 mLs by nebulization 2 (two) times daily. 01/11/20   [provider]  ?clonazePAM (KLONOPIN) 0.5 MG tablet Take 1 tablet (0.5 mg total) by mouth at bedtime. 01/31/22   Arfeen, Arlyce Harman, MD  ?dicyclomine (BENTYL) 10 MG capsule Take 1 capsule (10 mg total) by mouth 4 (four) times daily -  before meals and at bedtime for 3 days. 02/05/22 02/08/22  Lucrezia Starch, MD  ?feeding supplement, ENSURE ENLIVE, (ENSURE ENLIVE) LIQD Take 237 mLs by mouth 3 (three) times daily between meals. 02/16/20   Meuth, Blaine Hamper, PA-C  ?furosemide (  LASIX) 20 MG tablet Take 20 mg by mouth as needed. 11/10/19   [provider]  ?glipiZIDE (GLUCOTROL XL) 5 MG 24 hr tablet Take 1 tablet by mouth daily.    [provider]  ?LANTUS SOLOSTAR 100 UNIT/ML Solostar Pen SMARTSIG:16 Unit(s) SUB-Q Every Night 11/10/19   [provider]  ?levothyroxine (SYNTHROID) 112 MCG tablet Take 112 mcg by mouth daily. 03/21/21   [provider]  ?losartan (COZAAR) 50 MG tablet Take 1 tablet (50 mg total) by mouth daily. 08/10/20 11/08/20  Minna Merritts, MD  ?magnesium oxide (MAG-OX) 400 MG tablet Take by mouth.    [provider]  ?metoprolol tartrate (LOPRESSOR) 25 MG tablet Take 1 tablet by mouth 2 (two) times daily.    [provider]  ?mirtazapine (REMERON) 15 MG tablet Take 1 tablet (15 mg total) by mouth at bedtime. 01/31/22   Arfeen, Arlyce Harman, MD  ?nitroGLYCERIN (NITROSTAT) 0.4 MG SL tablet PLACE 1 TABLET (0.4 MG TOTAL) UNDER THE TONGUE EVERY 5 (FIVE) MINUTES AS NEEDED FOR CHEST PAIN. 11/17/19   Minna Merritts, MD  ?ondansetron  (ZOFRAN) 4 MG tablet Take 1 tablet (4 mg total) by mouth every 8 (eight) hours as needed for up to 5 doses for nausea or vomiting. ?Patient not taking: Reported on 01/31/2022 01/27/22   Lucrezia Starch, MD  ?Franchot Gallo

## 2022-03-07 NOTE — Progress Notes (Signed)
Admission profile updated. ?

## 2022-03-07 NOTE — ED Notes (Signed)
Pt requests medication to help her sleep , pharmacy request to verify trazadone and klonipin for this pt  ?

## 2022-03-07 NOTE — Hospital Course (Addendum)
81 year old female with past medical history of CAD, diabetes mellitus, chronic diastolic heart failure, hypothyroidism and rheumatoid arthritis presented to the emergency room on the night of 4/4 with complaints of chest pain.  Initial EKG and troponins unremarkable.  Patient brought in for further evaluation.  Cardiology consulted. ? ?Patient had some resolution of chest pain with sublingual nitroglycerin and morphine.  Vital signs remained stable.  Echocardiogram revealed grade 1 diastolic dysfunction no evidence of regional wall abnormality. ?

## 2022-03-07 NOTE — H&P (Addendum)
?  ?  ? ? ? ?PATIENT NAME: Catherine [name]   ? ?MR#:  [id number] ? ?DATE OF BIRTH:  [birth year] ? ?DATE OF ADMISSION:  03/06/2022 ? ?PRIMARY CARE PHYSICIAN: Danelle Berry, NP  ? ?Patient is coming from: Home ? ?REQUESTING/REFERRING PHYSICIAN:  Lurline Hare, MD ? ?CHIEF COMPLAINT:  ? ?Chief Complaint  ?Patient presents with  ? Chest Pain  ? ? ?HISTORY OF PRESENT ILLNESS:  ?Catherine [name] is a 81 y.o. Caucasian female with medical history significant for  CAD, DM 2, HTN, IBS, hypothyroidism, RA, CHF, complete heart block status post pacemaker, TAVR and fibromyalgia, who presented to the ER with acute onset of midsternal chest pain felt as pressure and graded 4/10 in severity with associated nausea and diaphoresis as well as dyspnea and mild dry cough with palpitations.  She admitted to radiation to her back as well as her neck.  She also admits to urinary frequency and left flank pain.  No fever or chills.  No dysuria, oliguria or hematuria or flank pain. ? ?ED Course: When she came to the ER, BP was 147/90 with otherwise normal vital signs.  Labs revealed normal BMP and CBC.  EKG as reviewed by me : EKG showed atrial sensed ventricular paced rhythm with a rate of 85 ?Imaging: Two-view chest x-ray showed her pacemaker with no acute cardiopulmonary disease. ? ?The patient was given 4 baby aspirin and 0.5 mg of p.o. Klonopin as well as and 1 inch of Nitropaste and 50 mg of p.o. trazodone patient be admitted to a cardiac telemetry observation bed for further evaluation and management. ? ?Past Medical History:  ?Diagnosis Date  ? Acute colitis   ? Anemia   ? CAD (coronary artery disease)   ? a. heavy calcification of the entire LAD & mod eccentric mid LAD stenosis, estimated @ 70%, mild nonobs stenosis of RCA and LCx, severely calcified Ao valve with restricted valve mobility and 2+ AI    ? Cancer [facility]) 1981  ? breast  ? CHB (complete heart block) ([place]) 08/31/2015  ? [place] (serial  number  [id number] )   ? Chronic diastolic congestive heart failure ([place])   ? a. echo 08/31/2015: EF 65-70%, nl WM, GR1DD, Ao valve stent bioprosthesis was present and functioning nl, no regurg, LA mildly dilated, PASP 46 mm Hg  ? Collagen vascular disease ([place])   ? Colon polyps   ? Depression   ? Diabetes mellitus type II   ? Fibromyalgia   ? HTN (hypertension)   ? Hypercholesteremia   ? Hypothyroidism   ? IBS (irritable bowel syndrome)   ? Presence of permanent cardiac pacemaker   ? Rectal bleeding   ? Rheumatoid arthritis(714.0)   ? S/P TAVR (transcatheter aortic valve replacement) 08/30/2015  ? 26 mm Edwards Sapien 3 transcatheter heart valve placed via open right transfemoral approach  ? Shortness of breath dyspnea   ? Stenosis of aortic valve   ? Suicide attempt [facility])   ? Thyroid disease   ? ? ?PAST SURGICAL HISTORY:  ? ?Past Surgical History:  ?Procedure Laterality Date  ? ABDOMINAL SURGERY    ? Intestinal surgery  ? CARDIAC SURGERY    ? CESAREAN SECTION    ? x 2  ? EP IMPLANTABLE DEVICE N/A 08/31/2015  ? Procedure: Pacemaker Implant;  Surgeon: Will Meredith Leeds, MD; [place] (serial number  [id number] ); Laterality: Right  ? ESOPHAGOGASTRODUODENOSCOPY (  EGD) WITH PROPOFOL N/A 04/04/2021  ? Procedure: ESOPHAGOGASTRODUODENOSCOPY (EGD) WITH PROPOFOL;  Surgeon: Jonathon Bellows, MD;  Location: [facility] ENDOSCOPY;  Service: Gastroenterology;  Laterality: N/A;  ? LAPAROSCOPY N/A 01/31/2020  ? Procedure: LAPAROSCOPY DIAGNOSTIC;  Surgeon: Kieth Brightly Arta Bruce, MD;  Location: [place];  Service: General;  Laterality: N/A;  ? LAPAROTOMY N/A 01/31/2020  ? Procedure: Exploratory Laparotomy;  Surgeon: Kinsinger, Arta Bruce, MD;  Location: [place];  Service: General;  Laterality: N/A;  ? LEFT OOPHORECTOMY    ? LYSIS OF ADHESION  01/31/2020  ? Procedure: Lysis Of Adhesions, Ligation of Falciform Vein;  Surgeon: Kinsinger, Arta Bruce, MD;  Location: [place];  Service: General;;  ? MASTECTOMY Left   ? polyp     ? self  inflicted chest wound    ? [place]  ? lower back  ? TEE WITHOUT CARDIOVERSION N/A 08/30/2015  ? Procedure: TRANSESOPHAGEAL ECHOCARDIOGRAM (TEE);  Surgeon: Sherren Mocha, MD;  Location: [place];  Service: Open Heart Surgery;  Laterality: N/A;  ? TOTAL ABDOMINAL HYSTERECTOMY    ? TRANSCATHETER AORTIC VALVE REPLACEMENT, TRANSFEMORAL N/A 08/30/2015  ? Procedure: TRANSCATHETER AORTIC VALVE REPLACEMENT, TRANSFEMORAL;  Surgeon: Sherren Mocha, MD;  Location: [place];  Service: Open Heart Surgery;  Laterality: N/A;  ? ? ?SOCIAL HISTORY:  ? ?Social History  ? ?Tobacco Use  ? Smoking status: Never  ? Smokeless tobacco: Never  ?Substance Use Topics  ? Alcohol use: No  ?  Alcohol/week: 0.0 standard drinks  ? ? ?FAMILY HISTORY:  ? ?Family History  ?Problem Relation Age of Onset  ? Depression Sister   ? Depression Sister   ? Depression Sister   ? Hypertension Mother   ? Lung cancer Sister   ? Stroke Sister   ? Hypertension Sister   ? Pneumonia Brother   ? Heart attack Neg Hx   ? ? ?DRUG ALLERGIES:  ? ?Allergies  ?Allergen Reactions  ? Tetracyclines & Related Anaphylaxis and Rash  ? Latex   ? Sulfa Antibiotics Hives  ? Latex Rash  ? ? ?REVIEW OF SYSTEMS:  ? ?ROS ?As per history of present illness. All pertinent systems were reviewed above. Constitutional, HEENT, cardiovascular, respiratory, GI, GU, musculoskeletal, neuro, psychiatric, endocrine, integumentary and hematologic systems were reviewed and are otherwise negative/unremarkable except for positive findings mentioned above in the HPI. ? ? ?MEDICATIONS AT HOME:  ? ?Prior to Admission medications   ?Medication Sig Start Date End Date Taking? Authorizing Provider  ?acetaminophen (TYLENOL) 500 MG tablet Take 1,000 mg by mouth every 6 (six) hours as needed for mild pain.    [provider]  ?Amino Acids-Protein Hydrolys (FEEDING SUPPLEMENT, PRO-STAT SUGAR FREE 64,) LIQD Take 30 mLs by mouth 3 (three) times daily. 02/16/20   Meuth, Brooke A, PA-C  ?atorvastatin  (LIPITOR) 40 MG tablet Take 1 tablet by mouth daily. 03/21/21   [provider]  ?budesonide (PULMICORT) 0.5 MG/2ML nebulizer solution Take 2 mLs by nebulization 2 (two) times daily. 01/11/20   [provider]  ?clonazePAM (KLONOPIN) 0.5 MG tablet Take 1 tablet (0.5 mg total) by mouth at bedtime. 01/31/22   Arfeen, Arlyce Harman, MD  ?dicyclomine (BENTYL) 10 MG capsule Take 1 capsule (10 mg total) by mouth 4 (four) times daily -  before meals and at bedtime for 3 days. 02/05/22 02/08/22  Lucrezia Starch, MD  ?feeding supplement, ENSURE ENLIVE, (ENSURE ENLIVE) LIQD Take 237 mLs by mouth 3 (three) times daily between meals. 02/16/20   Meuth, Blaine Hamper, PA-C  ?  furosemide (LASIX) 20 MG tablet Take 20 mg by mouth as needed. 11/10/19   [provider]  ?glipiZIDE (GLUCOTROL XL) 5 MG 24 hr tablet Take 1 tablet by mouth daily.    [provider]  ?LANTUS SOLOSTAR 100 UNIT/ML Solostar Pen SMARTSIG:16 Unit(s) SUB-Q Every Night 11/10/19   [provider]  ?levothyroxine (SYNTHROID) 112 MCG tablet Take 112 mcg by mouth daily. 03/21/21   [provider]  ?losartan (COZAAR) 50 MG tablet Take 1 tablet (50 mg total) by mouth daily. 08/10/20 11/08/20  Minna Merritts, MD  ?magnesium oxide (MAG-OX) 400 MG tablet Take by mouth.    [provider]  ?metoprolol tartrate (LOPRESSOR) 25 MG tablet Take 1 tablet by mouth 2 (two) times daily.    [provider]  ?mirtazapine (REMERON) 15 MG tablet Take 1 tablet (15 mg total) by mouth at bedtime. 01/31/22   Arfeen, Arlyce Harman, MD  ?nitroGLYCERIN (NITROSTAT) 0.4 MG SL tablet PLACE 1 TABLET (0.4 MG TOTAL) UNDER THE TONGUE EVERY 5 (FIVE) MINUTES AS NEEDED FOR CHEST PAIN. 11/17/19   Minna Merritts, MD  ?ondansetron (ZOFRAN) 4 MG tablet Take 1 tablet (4 mg total) by mouth every 8 (eight) hours as needed for up to 5 doses for nausea or vomiting. ?Patient not taking: Reported on 01/31/2022 01/27/22   Lucrezia Starch, MD  ?[facility] VERIO test strip   07/25/21   [provider]  ?oxybutynin (DITROPAN-XL) 10 MG 24 hr tablet Take 10 mg by mouth daily. 03/14/21   [provider]  ?potassium chloride (KLOR-CON M) 10 MEQ tablet Take by mouth. 1/

## 2022-03-07 NOTE — Assessment & Plan Note (Addendum)
Seen again as grade 1 on echocardiogram.  Euvolemic.  Normal BNP. ?

## 2022-03-07 NOTE — Assessment & Plan Note (Addendum)
Serial troponins stable.  Chest pain now resolved at first.  Placed on beta-blocker and ARB therapy.  Continue statin therapy.  Echocardiogram revealed no wall abnormalities.  Patient started having more chest pain on day of discharge, but appears to be more musculoskeletal as it was tender to touch.  Recommended for warm compresses.  Outpatient follow-up with cardiology. ?

## 2022-03-07 NOTE — Progress Notes (Signed)
[facility] Progress Note ? ?Patient: Catherine [name]    ZMO:[id number]  DOA: 03/06/2022    ?Date of Service: the patient was seen and examined on 03/07/2022 ? ?Brief hospital course: ?81 year old female with past medical history of CAD, diabetes mellitus, chronic diastolic heart failure, hypothyroidism and rheumatoid arthritis presented to the emergency room on the night of 4/4 with complaints of chest pain.  Initial EKG and troponins unremarkable.  Patient brought in for further evaluation.  Cardiology consulted. ? ?Patient had some resolution of chest pain with sublingual nitroglycerin and morphine.  Vital signs remained stable.  Cardiology recommends echocardiogram which is pending and if clear, patient can be discharged. ? ?Assessment and Plan: ?Assessment and Plan: ?* Chest pain ?Serial troponins stable.  Chest pain now resolved.  Placed on beta-blocker and ARB therapy.  Continue statin therapy.  Cardiology recommending echocardiogram. ? ?Hypothyroidism, unspecified ?- We will continue Synthroid. ? ?Type 2 diabetes mellitus without complications ([place]) ?- We will place her on supplement coverage with NovoLog ? ?Anxiety and depression ?- We will continue Zoloft and Klonopin. ? ?Chronic diastolic CHF (congestive heart failure) ([place]) ?Appears euvolemic.  Check BNP. ? ?Essential hypertension ?- We will continue her antihypertensives. ? ?Urinary frequency ?Urinalysis unremarkable. ? ? ? ? ? ? ?Body mass index is 33.2 kg/m?.  ?  ?   ? ?Consultants: ?Cardiology ? ?Procedures: ?Echocardiogram which is pending ? ?Antimicrobials: ?None ? ?Code Status: Full code ? ? ?Subjective: Patient still having some chest pain, about the same.  Unrelieved with medication. ? ?Objective: ?Vital signs were reviewed and unremarkable. ?Vitals:  ? 03/07/22 1548 03/07/22 1726  ?BP: 122/65 (!) 116/48  ?Pulse: 88 68  ?Resp: 19 20  ?Temp: 98.4 ?F (36.9 ?C) 98.3 ?F (36.8 ?C)  ?SpO2: 98% 99%  ? ?No intake or output data in the 24 hours ending  03/07/22 1757 ?Filed Weights  ? 03/07/22 0451  ?Weight: 77.1 kg  ? ?Body mass index is 33.2 kg/m?. ? ?Exam: ? ?General: Alert and oriented x3, no acute distress ?HEENT: Normocephalic atraumatic, mucous membranes are moist ?Cardiovascular: Regular rate and rhythm, S1-S2, 2 out of 6 systolic ejection murmur ?Respiratory: Clear to auscultation bilaterally ?Abdomen: Soft, nontender, nondistended, positive bowel sounds ?Musculoskeletal: No clubbing or cyanosis or edema ?Skin: No skin breaks, tears or lesions ?Psychiatry: Appropriate, no evidence of psychoses ?Neurology: No focal deficits  ? ?Data Reviewed: ?Today's labs reviewed.  No abnormalities ? ?Disposition:  ?Status is: Observation ? ?  ? ?Anticipated discharge date: 4/6 ? ?Remaining issues to be resolved so that patient can be discharged: Echocardiogram ? ? ?Family Communication: Daughter at the bedside  ?DVT Prophylaxis: ?enoxaparin (LOVENOX) injection 40 mg Start: 03/07/22 0800 ? ? ? ?Author: ?Annita Brod ,MD ?03/07/2022 5:57 PM ? ?To reach On-call, see care teams to locate the attending and reach out via [email or web address]. ?Between 7PM-7AM, please contact night-coverage ?If you still have difficulty reaching the attending provider, please page the [facility] (Director on Call) for [facility] on amion for assistance. ? ?

## 2022-03-07 NOTE — Assessment & Plan Note (Signed)
-   We will continue Zoloft and Klonopin. ?

## 2022-03-07 NOTE — Assessment & Plan Note (Signed)
-   We will continue Synthroid. 

## 2022-03-07 NOTE — Plan of Care (Signed)
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
## 2022-03-08 ENCOUNTER — Observation Stay (HOSPITAL_BASED_OUTPATIENT_CLINIC_OR_DEPARTMENT_OTHER)
Admit: 2022-03-08 | Discharge: 2022-03-08 | Disposition: A | Discharge status: Home / Self Care | Payer: Medicare Other | Attending: Cardiology | Admitting: Cardiology

## 2022-03-08 DIAGNOSIS — I5032 Chronic diastolic (congestive) heart failure: Secondary | ICD-10-CM | POA: Diagnosis not present

## 2022-03-08 DIAGNOSIS — Z952 Presence of prosthetic heart valve: Secondary | ICD-10-CM

## 2022-03-08 DIAGNOSIS — I10 Essential (primary) hypertension: Secondary | ICD-10-CM | POA: Diagnosis not present

## 2022-03-08 DIAGNOSIS — R072 Precordial pain: Secondary | ICD-10-CM

## 2022-03-08 DIAGNOSIS — E119 Type 2 diabetes mellitus without complications: Secondary | ICD-10-CM | POA: Diagnosis not present

## 2022-03-08 DIAGNOSIS — I25118 Atherosclerotic heart disease of native coronary artery with other forms of angina pectoris: Secondary | ICD-10-CM

## 2022-03-08 DIAGNOSIS — I350 Nonrheumatic aortic (valve) stenosis: Secondary | ICD-10-CM

## 2022-03-08 DIAGNOSIS — R079 Chest pain, unspecified: Secondary | ICD-10-CM | POA: Diagnosis not present

## 2022-03-08 LAB — GLUCOSE, CAPILLARY
Glucose-Capillary: 111 mg/dL — ABNORMAL HIGH (ref 70–99)
Glucose-Capillary: 108 mg/dL — ABNORMAL HIGH (ref 70–99)

## 2022-03-08 LAB — HEMOGLOBIN A1C
Hgb A1c MFr Bld: 5.8 % — ABNORMAL HIGH (ref 4.8–5.6)
Mean Plasma Glucose: 120 mg/dL

## 2022-03-08 LAB — ECHOCARDIOGRAM COMPLETE
AR max vel: 2.36 cm2
AV Area VTI: 2.39 cm2
AV Area mean vel: 2.48 cm2
AV Mean grad: 4 mmHg
AV Peak grad: 6.2 mmHg
Ao pk vel: 1.24 m/s
Area-P 1/2: 7.82 cm2
Height: 60 in
S' Lateral: 2.1 cm
Weight: 2719.59 oz

## 2022-03-08 MED ORDER — LOSARTAN POTASSIUM 25 MG PO TABS: 25.0000 mg | ORAL_TABLET | Freq: Every day | ORAL | 1 refills | Status: DC

## 2022-03-08 MED ORDER — ISOSORBIDE MONONITRATE ER 30 MG PO TB24: 15.0000 mg | ORAL_TABLET | Freq: Every day | ORAL | 1 refills | Status: DC

## 2022-03-08 NOTE — Discharge Summary (Addendum)
Physician Discharge Summary   Patient: Catherine [name] MRN: [id number] DOB: 1941/01/18  Admit date:     03/06/2022  Discharge date: 03/08/22  Discharge Physician: Catherine [name]   PCP: Catherine Helper, NP   Recommendations at discharge:   Outpatient follow-up with cardiology Warm compresses for chest pain New medication: Imdur 15 mg p.o. daily  Discharge Diagnoses: Principal Problem:   Chest pain Active Problems:   Hypothyroidism, unspecified   Type 2 diabetes mellitus without complications (HCC)   Anxiety and depression   Essential hypertension   Chronic diastolic CHF (congestive heart failure) (HCC)   Urinary frequency  Resolved Problems:   * No resolved hospital problems. *  Hospital Course: 81 year old female with past medical history of CAD, diabetes mellitus, chronic diastolic heart failure, hypothyroidism and rheumatoid arthritis presented to the emergency room on the night of 4/4 with complaints of chest pain.  Initial EKG and troponins unremarkable.  Patient brought in for further evaluation.  Cardiology consulted.  Patient had some resolution of chest pain with sublingual nitroglycerin and morphine.  Vital signs remained stable.  Echocardiogram revealed grade 1 diastolic dysfunction no evidence of regional wall abnormality.  Assessment and Plan: * Chest pain Serial troponins stable.  Chest pain now resolved at first.  Placed on beta-blocker and ARB therapy.  Continue statin therapy.  Echocardiogram revealed no wall abnormalities.  Patient started having more chest pain on day of discharge, but appears to be more musculoskeletal as it was tender to touch.  Recommended for warm compresses.  Outpatient follow-up with cardiology.  Hypothyroidism, unspecified - We will continue Synthroid.  Type 2 diabetes mellitus without complications (HCC) - We will place her on supplement coverage with NovoLog  Anxiety and depression - We will continue Zoloft and  Klonopin.  Chronic diastolic CHF (congestive heart failure) (HCC) Seen again as grade 1 on echocardiogram.  Euvolemic.  Normal BNP.  Essential hypertension - We will continue her antihypertensives.  Imdur added to her regimen  Urinary frequency Urinalysis unremarkable.         Consultants: Cardiology Procedures performed: Echocardiogram: No wall motion abnormalities, grade 1 diastolic dysfunction Disposition: Home Diet recommendation:  Discharge Diet Orders (From admission, onward)     Start     Ordered   03/08/22 0000  Diet - low sodium heart healthy        03/08/22 1440           Cardiac diet DISCHARGE MEDICATION: Allergies as of 03/08/2022       Reactions   Tetracyclines & Related Anaphylaxis, Rash   Latex    Sulfa Antibiotics Hives   Latex Rash        Medication List     STOP taking these medications    dicyclomine 10 MG capsule Commonly known as: BENTYL   Lantus SoloStar 100 UNIT/ML Solostar Pen Generic drug: insulin glargine       TAKE these medications    acetaminophen 500 MG tablet Commonly known as: TYLENOL Take 1,000 mg by mouth every 6 (six) hours as needed for mild pain.   atorvastatin 40 MG tablet Commonly known as: LIPITOR Take 1 tablet by mouth daily.   budesonide 0.5 MG/2ML nebulizer solution Commonly known as: PULMICORT Take 2 mLs by nebulization 2 (two) times daily.   clonazePAM 0.5 MG tablet Commonly known as: KLONOPIN Take 1 tablet (0.5 mg total) by mouth at bedtime.   isosorbide mononitrate 30 MG 24 hr tablet Commonly known as: IMDUR Take 0.5 tablets (15  mg total) by mouth daily. Start taking on: March 09, 2022   levothyroxine 112 MCG tablet Commonly known as: SYNTHROID Take 112 mcg by mouth daily.   losartan 25 MG tablet Commonly known as: COZAAR Take 1 tablet (25 mg total) by mouth daily. Start taking on: March 09, 2022 What changed:  medication strength how much to take   magnesium oxide 400 MG  tablet Commonly known as: MAG-OX Take by mouth.   multivitamin with minerals Tabs tablet Take 1 tablet by mouth daily.   nitroGLYCERIN 0.4 MG SL tablet Commonly known as: NITROSTAT PLACE 1 TABLET (0.4 MG TOTAL) UNDER THE TONGUE EVERY 5 (FIVE) MINUTES AS NEEDED FOR CHEST PAIN.   ondansetron 4 MG tablet Commonly known as: Zofran Take 1 tablet (4 mg total) by mouth every 8 (eight) hours as needed for up to 5 doses for nausea or vomiting.   OneTouch Verio test strip Generic drug: glucose blood   oxybutynin 10 MG 24 hr tablet Commonly known as: DITROPAN-XL Take 10 mg by mouth daily.   potassium chloride 10 MEQ tablet Commonly known as: KLOR-CON TAKE 1 TABLET BY MOUTH EVERY OTHER DAY   sertraline 50 MG tablet Commonly known as: ZOLOFT Take 2.5 tablets (125 mg total) by mouth daily.   traZODone 50 MG tablet Commonly known as: DESYREL Take trazodone 50 mg as needed, take 1-2 tabs at night        Discharge Exam: Filed Weights   03/07/22 0451  Weight: 77.1 kg   General: Alert and oriented x3, no acute distress Cardiovascular: Regular rate and rhythm, 2 out of 6 systolic ejection murmur Lungs: Clear to auscultation bilaterally  Condition at discharge: good  The results of significant diagnostics from this hospitalization (including imaging, microbiology, ancillary and laboratory) are listed below for reference.   Imaging Studies: DG Chest 2 View  Result Date: 03/06/2022 CLINICAL DATA:  Chest pain, short of breath EXAM: CHEST - 2 VIEW COMPARISON:  None. FINDINGS: RIGHT pacemaker overlies stable cardiac silhouette. Prosthetic aortic valve noted. Normal mediastinum and cardiac silhouette. Normal pulmonary vasculature. No evidence of effusion, infiltrate, or pneumothorax. Mild scarring at the RIGHT lung base. No acute bony abnormality. Degenerative changes of the shoulders. IMPRESSION: No acute cardiopulmonary process. Electronically Signed   By: Genevive Bi M.D.   On:  03/06/2022 17:49   ECHOCARDIOGRAM COMPLETE  Result Date: 03/08/2022    ECHOCARDIOGRAM REPORT   Patient Name:   Catherine [name] Date of Exam: 03/08/2022 Medical Rec #:  [id number]      Height:       60.0 in Accession #:    [id number]     Weight:       170.0 lb Date of Birth:  [birth year]      BSA:          1.742 m Patient Age:    80 years       BP:           147/54 mmHg Patient Gender: F              HR:           743 bpm. Exam Location:  [facility] Procedure: 2D Echo, Cardiac Doppler and Color Doppler Indications:     Chest pain R07.9  History:         Patient has prior history of Echocardiogram examinations, most                  recent 10/24/2018. Risk Factors:Hypertension. CHB,  chronic                  diastolic CHF, S/P TAVR.  Sonographer:     Cristela Blue Referring Phys:  [id number] Debbe Odea Diagnosing Phys: Julien Nordmann MD  Sonographer Comments: Suboptimal apical window. IMPRESSIONS  1. Left ventricular ejection fraction, by estimation, is 55%. The left ventricle has normal function. The left ventricle demonstrates regional wall motion abnormalities (septal wall motion abnormality likely secondary to conduction abnormality). Left ventricular diastolic parameters are consistent with Grade I diastolic dysfunction (impaired relaxation).  2. Right ventricular systolic function is normal. The right ventricular size is normal.  3. The mitral valve is normal in structure. No evidence of mitral valve regurgitation. No evidence of mitral stenosis.  4. The aortic valve has been repaired/replaced. Aortic valve regurgitation is not visualized. No aortic stenosis is present. Aortic valve area, by VTI measures 2.39 cm. Aortic valve mean gradient measures 4.0 mmHg.  5. The inferior vena cava is normal in size with greater than 50% respiratory variability, suggesting right atrial pressure of 3 mmHg. FINDINGS  Left Ventricle: Left ventricular ejection fraction, by estimation, is 55%. The left ventricle has normal function. The  left ventricle demonstrates regional wall motion abnormalities. The left ventricular internal cavity size was normal in size. There is  no left ventricular hypertrophy. Left ventricular diastolic parameters are consistent with Grade I diastolic dysfunction (impaired relaxation). Right Ventricle: The right ventricular size is normal. No increase in right ventricular wall thickness. Right ventricular systolic function is normal. Left Atrium: Left atrial size was normal in size. Right Atrium: Right atrial size was normal in size. Pericardium: There is no evidence of pericardial effusion. Mitral Valve: The mitral valve is normal in structure. No evidence of mitral valve regurgitation. No evidence of mitral valve stenosis. Tricuspid Valve: The tricuspid valve is normal in structure. Tricuspid valve regurgitation is not demonstrated. No evidence of tricuspid stenosis. Aortic Valve: The aortic valve has been repaired/replaced. Aortic valve regurgitation is not visualized. No aortic stenosis is present. Aortic valve mean gradient measures 4.0 mmHg. Aortic valve peak gradient measures 6.2 mmHg. Aortic valve area, by VTI measures 2.39 cm. There is a TAVR valve present in the aortic position. Pulmonic Valve: The pulmonic valve was normal in structure. Pulmonic valve regurgitation is not visualized. No evidence of pulmonic stenosis. Aorta: The aortic root is normal in size and structure. Venous: The inferior vena cava is normal in size with greater than 50% respiratory variability, suggesting right atrial pressure of 3 mmHg. IAS/Shunts: No atrial level shunt detected by color flow Doppler. Additional Comments: A device lead is visualized.  LEFT VENTRICLE PLAX 2D LVIDd:         2.90 cm   Diastology LVIDs:         2.10 cm   LV e' medial:    4.24 cm/s LV PW:         1.00 cm   LV E/e' medial:  19.3 LV IVS:        1.40 cm   LV e' lateral:   5.98 cm/s LVOT diam:     2.00 cm   LV E/e' lateral: 13.7 LV SV:         54 LV SV Index:   31  LVOT Area:     3.14 cm  RIGHT VENTRICLE RV S prime:     11.00 cm/s TAPSE (M-mode): 1.6 cm LEFT ATRIUM           Index  RIGHT ATRIUM           Index LA diam:      3.20 cm 1.84 cm/m   RA Area:     19.40 cm LA Vol (A2C): 88.6 ml 50.86 ml/m  RA Volume:   63.70 ml  36.57 ml/m LA Vol (A4C): 44.6 ml 25.60 ml/m  AORTIC VALVE                    PULMONIC VALVE AV Area (Vmax):    2.36 cm     PV Vmax:        0.78 m/s AV Area (Vmean):   2.48 cm     PV Vmean:       49.100 cm/s AV Area (VTI):     2.39 cm     PV VTI:         0.124 m AV Vmax:           124.00 cm/s  PV Peak grad:   2.5 mmHg AV Vmean:          89.600 cm/s  PV Mean grad:   1.0 mmHg AV VTI:            0.226 m      RVOT Peak grad: 3 mmHg AV Peak Grad:      6.2 mmHg AV Mean Grad:      4.0 mmHg LVOT Vmax:         93.30 cm/s LVOT Vmean:        70.700 cm/s LVOT VTI:          0.172 m LVOT/AV VTI ratio: 0.76  AORTA Ao Root diam: 2.50 cm MITRAL VALVE                TRICUSPID VALVE MV Area (PHT): 7.82 cm     TR Peak grad:   18.8 mmHg MV Decel Time: 97 msec      TR Vmax:        217.00 cm/s MV E velocity: 81.80 cm/s MV A velocity: 153.00 cm/s  SHUNTS MV E/A ratio:  0.53         Systemic VTI:  0.17 m                             Systemic Diam: 2.00 cm                             Pulmonic VTI:  0.138 m Julien Nordmann MD Electronically signed by Julien Nordmann MD Signature Date/Time: 03/08/2022/12:24:55 PM    Final     Microbiology: Results for orders placed or performed during the hospital encounter of 01/27/22  Culture, blood (Routine x 2)     Status: Abnormal   Collection Time: 01/27/22  1:01 PM   Specimen: BLOOD  Result Value Ref Range Status   Specimen Description   Final    BLOOD RIGHT ANTECUBITAL Performed at [facility], [address] [place], [place] [address]    Special Requests   Final    BOTTLES DRAWN AEROBIC AND ANAEROBIC Blood Culture results may not be optimal due to an inadequate volume of blood received in culture  bottles Performed at [facility], [address] [place], [place] [address]    Culture  Setup Time   Final    GRAM POSITIVE COCCI ANAEROBIC BOTTLE ONLY CRITICAL RESULT CALLED TO, READ BACK BY AND  VERIFIED WITH: ANGELA ROBBINS AT 9562 01/28/22.PMF    Culture (A)  Final    STAPHYLOCOCCUS HOMINIS STAPHYLOCOCCUS EPIDERMIDIS STREPTOCOCCUS PARASANGUINIS THE SIGNIFICANCE OF ISOLATING THIS ORGANISM FROM A SINGLE SET OF BLOOD CULTURES WHEN MULTIPLE SETS ARE DRAWN IS UNCERTAIN. PLEASE NOTIFY THE MICROBIOLOGY DEPARTMENT WITHIN ONE WEEK IF SPECIATION AND SENSITIVITIES ARE REQUIRED. Performed at [facility], [address], [place], [place] [address]    Report Status 01/31/2022 FINAL  Final  Blood Culture ID Panel (Reflexed)     Status: Abnormal   Collection Time: 01/27/22  1:01 PM  Result Value Ref Range Status   Enterococcus faecalis NOT DETECTED NOT DETECTED Final   Enterococcus Faecium NOT DETECTED NOT DETECTED Final   Listeria monocytogenes NOT DETECTED NOT DETECTED Final   Staphylococcus species DETECTED (A) NOT DETECTED Final    Comment: CRITICAL RESULT CALLED TO, READ BACK BY AND VERIFIED WITH: ANGELA ROBBINS AT 0736 01/28/22.PMF    Staphylococcus aureus (BCID) NOT DETECTED NOT DETECTED Final   Staphylococcus epidermidis DETECTED (A) NOT DETECTED Final    Comment: Methicillin (oxacillin) resistant coagulase negative staphylococcus. Possible blood culture contaminant (unless isolated from more than one blood culture draw or clinical case suggests pathogenicity). No antibiotic treatment is indicated for blood  culture contaminants. CRITICAL RESULT CALLED TO, READ BACK BY AND VERIFIED WITH: ANGELA ROBBINS AT 5784 01/28/22.PMF    Staphylococcus lugdunensis NOT DETECTED NOT DETECTED Final   Streptococcus species DETECTED (A) NOT DETECTED Final    Comment: Not Enterococcus species, Streptococcus agalactiae, Streptococcus pyogenes, or Streptococcus pneumoniae. CRITICAL RESULT  CALLED TO, READ BACK BY AND VERIFIED WITH: ANGELA ROBBINS AT 6962 01/28/22.PMF    Streptococcus agalactiae NOT DETECTED NOT DETECTED Final   Streptococcus pneumoniae NOT DETECTED NOT DETECTED Final   Streptococcus pyogenes NOT DETECTED NOT DETECTED Final   A.calcoaceticus-baumannii NOT DETECTED NOT DETECTED Final   Bacteroides fragilis NOT DETECTED NOT DETECTED Final   Enterobacterales NOT DETECTED NOT DETECTED Final   Enterobacter cloacae complex NOT DETECTED NOT DETECTED Final   Escherichia coli NOT DETECTED NOT DETECTED Final   Klebsiella aerogenes NOT DETECTED NOT DETECTED Final   Klebsiella oxytoca NOT DETECTED NOT DETECTED Final   Klebsiella pneumoniae NOT DETECTED NOT DETECTED Final   Proteus species NOT DETECTED NOT DETECTED Final   Salmonella species NOT DETECTED NOT DETECTED Final   Serratia marcescens NOT DETECTED NOT DETECTED Final   Haemophilus influenzae NOT DETECTED NOT DETECTED Final   Neisseria meningitidis NOT DETECTED NOT DETECTED Final   Pseudomonas aeruginosa NOT DETECTED NOT DETECTED Final   Stenotrophomonas maltophilia NOT DETECTED NOT DETECTED Final   Candida albicans NOT DETECTED NOT DETECTED Final   Candida auris NOT DETECTED NOT DETECTED Final   Candida glabrata NOT DETECTED NOT DETECTED Final   Candida krusei NOT DETECTED NOT DETECTED Final   Candida parapsilosis NOT DETECTED NOT DETECTED Final   Candida tropicalis NOT DETECTED NOT DETECTED Final   Cryptococcus neoformans/gattii NOT DETECTED NOT DETECTED Final   Methicillin resistance mecA/C DETECTED (A) NOT DETECTED Final    Comment: CRITICAL RESULT CALLED TO, READ BACK BY AND VERIFIED WITH: ANGELA ROBBINS AT 0736 01/28/22.PMF Performed at [facility], [address] [place], [place] [address]   Culture, blood (Routine x 2)     Status: None   Collection Time: 01/27/22  1:02 PM   Specimen: BLOOD  Result Value Ref Range Status   Specimen Description BLOOD RIGHT ANTECUBITAL  Final   Special  Requests   Final    BOTTLES  DRAWN AEROBIC AND ANAEROBIC Blood Culture adequate volume   Culture   Final    NO GROWTH 5 DAYS Performed at [facility], [address] [place] [place], [place] [address]    Report Status 02/01/2022 FINAL  Final    Labs: CBC: Recent Labs  Lab 03/06/22 1717 03/07/22 0321  WBC 9.1 10.0  HGB 12.3 11.3*  HCT 38.4 36.1  MCV 88.7 90.3  PLT 300 260   Basic Metabolic Panel: Recent Labs  Lab 03/06/22 1717 03/07/22 0321  NA 138 136  K 4.2 3.6  CL 102 103  CO2 27 25  GLUCOSE 89 110*  BUN 19 18  CREATININE 0.87 0.84  CALCIUM 9.5 9.0   Liver Function Tests: No results for input(s): AST, ALT, ALKPHOS, BILITOT, PROT, ALBUMIN in the last 168 hours. CBG: Recent Labs  Lab 03/07/22 1143 03/07/22 1627 03/07/22 2125 03/08/22 0814 03/08/22 1208  GLUCAP 104* 111* 113* 111* 108*    Discharge time spent: less than 30 minutes.  Signed: Hollice Espy, MD [facility] 03/08/2022

## 2022-03-08 NOTE — Progress Notes (Signed)
? ? ?Progress Note ? ?Patient Name: Catherine [name] ?Date of Encounter: 03/08/2022 ? ?Primary Cardiologist: Rockey Situ ? ?Subjective  ? ?She continues to note left-sided chest discomfort that is reproducible to her palpation and reproducible to palpation from echo tech probe.  She has been started on Imdur.  Vitals stable.  High-sensitivity troponin negative. ? ?Inpatient Medications  ?  ?Scheduled Meds: ? atorvastatin  40 mg Oral Daily  ? budesonide  2 mL Nebulization BID  ? clonazePAM  0.5 mg Oral QHS  ? enoxaparin (LOVENOX) injection  40 mg Subcutaneous Q24H  ? feeding supplement  237 mL Oral TID BM  ? feeding supplement (PRO-STAT SUGAR FREE 64)  30 mL Oral TID  ? insulin aspart  0-9 Units Subcutaneous TID AC & HS  ? isosorbide mononitrate  15 mg Oral Daily  ? levothyroxine  112 mcg Oral Q0600  ? losartan  25 mg Oral Daily  ? magnesium oxide  400 mg Oral Daily  ? mirtazapine  15 mg Oral QHS  ? oxybutynin  10 mg Oral Daily  ? potassium chloride  5 mEq Oral Daily  ? sertraline  125 mg Oral Daily  ? ?Continuous Infusions: ? ?PRN Meds: ?acetaminophen **OR** acetaminophen, furosemide, magnesium hydroxide, metoCLOPramide (REGLAN) injection, nitroGLYCERIN, traZODone  ? ?Vital Signs  ?  ?Vitals:  ? 03/08/22 0426 03/08/22 0445 03/08/22 0810 03/08/22 0815  ?BP: (!) 143/54  (!) 147/54   ?Pulse: 70  74   ?Resp: 16  14   ?Temp: (!) 97.5 ?F (36.4 ?C) 97.7 ?F (36.5 ?C) 97.8 ?F (36.6 ?C)   ?TempSrc:  Oral Oral   ?SpO2: 100%  99% 99%  ?Weight:      ?Height:      ? ? ?Intake/Output Summary (Last 24 hours) at 03/08/2022 1138 ?Last data filed at 03/08/2022 0900 ?Gross per 24 hour  ?Intake 240 ml  ?Output --  ?Net 240 ml  ? ?Filed Weights  ? 03/07/22 0451  ?Weight: 77.1 kg  ? ? ?Telemetry  ?  ?Paced - Personally Reviewed ? ?ECG  ?  ?No new tracings - Personally Reviewed ? ?Physical Exam  ? ?GEN: No acute distress.   ?Neck: No JVD. ?Cardiac: RRR, no murmurs, rubs, or gallops.  Left-sided chest pain is reproducible to palpation. ?Respiratory:  Clear to auscultation bilaterally.  ?GI: Soft, nontender, non-distended.   ?MS: No edema; No deformity. ?Neuro:  Alert and oriented x 3; Nonfocal.  ?Psych: Normal affect. ? ?Labs  ?  ?Chemistry ?Recent Labs  ?Lab 03/06/22 ?1717 03/07/22 ?0321  ?NA 138 136  ?K 4.2 3.6  ?CL 102 103  ?CO2 27 25  ?GLUCOSE 89 110*  ?BUN 19 18  ?CREATININE 0.87 0.84  ?CALCIUM 9.5 9.0  ?GFRNONAA >60 >60  ?ANIONGAP 9 8  ?  ? ?Hematology ?Recent Labs  ?Lab 03/06/22 ?1717 03/07/22 ?0321  ?WBC 9.1 10.0  ?RBC 4.33 4.00  ?HGB 12.3 11.3*  ?HCT 38.4 36.1  ?MCV 88.7 90.3  ?MCH 28.4 28.3  ?MCHC 32.0 31.3  ?RDW 13.4 13.4  ?PLT 300 260  ? ? ?Cardiac EnzymesNo results for input(s): TROPONINI in the last 168 hours. No results for input(s): TROPIPOC in the last 168 hours.  ? ?BNP ?Recent Labs  ?Lab 03/07/22 ?1810  ?BNP 47.0  ?  ? ?DDimer No results for input(s): DDIMER in the last 168 hours.  ? ?Radiology  ?  ?DG Chest 2 View ? ?Result Date: 03/06/2022 ?IMPRESSION: No acute cardiopulmonary process. Electronically Signed   By: Nicole Kindred  Leonia Reeves M.D.   On: 03/06/2022 17:49   ? ?Cardiac Studies  ? ?2D echo 03/08/2022: ?Pending ?__________ ? ?2D echo 10/24/2018: ?- Left ventricle: The cavity size was normal. There was mild  ?  concentric hypertrophy. Systolic function was normal. The  ?  estimated ejection fraction was in the range of 50% to 55%. Wall  ?  motion was normal; there were no regional wall motion  ?  abnormalities. Doppler parameters are consistent with abnormal  ?  left ventricular relaxation (grade 1 diastolic dysfunction).  ?- Aortic valve: A Bioprosethic prosthesis was present. Peak  ?  velocity (S): 143 cm/s. Mean gradient (S): 6 mm Hg.  ?- Left atrium: The atrium was moderately dilated.  ?- Right ventricle: Systolic function was normal.  ?- Right atrium: The atrium was mildly dilated.  ?- Tricuspid valve: There was mild regurgitation.  ?- Pulmonary arteries: Systolic pressure was within the normal  ?  range.  ?__________ ? ?R/LHC 08/11/2015: ?1.  Single-vessel coronary artery disease with heavy calcification of the entire LAD and a moderate eccentric mid LAD stenosis, estimated at 70% ?2. Mild nonobstructive stenosis of the RCA and left circumflex ?3. Severely calcified aortic valve with restricted valve mobility and 2+ aortic insufficiency, and known severe aortic stenosis by noninvasive evaluation ?  ?Continued multidisciplinary evaluation for definitive treatment of severe aortic stenosis ? ?Patient Profile  ?   ?81 y.o. female with history of CAD medically managed, aortic stenosis status post TAVR in 0/0867 complicated by complete heart block status post PPM complicated by hemothorax, CVA, TIA, HFpEF, venous insufficiency, DM2, HTN, HLD, hypothyroidism, anemia, RA, fibromyalgia, and IBS who we are seeing for chest pain. ? ?Assessment & Plan  ?  ?1.  CAD involving the native coronary arteries with atypical chest pain: ?-She continues to note left-sided chest pain that is reproducible to palpation and from the echo probe ?-She has ruled out ?-Reasonable to continue newly started Imdur ?-Await echo ?-If symptoms persist, consider outpatient Lexiscan MPI ? ?2.  Aortic stenosis status post TAVR: ?-Echo pending ?-SBE prophylaxis ? ?3.  Complete heart block status post PPM: ?-Device functioning normally ? ? ?   ? ?For questions or updates, please contact [place] ?Please consult [email or web address] for contact info under Cardiology/STEMI. ?  ? ?Signed, ?Christell Faith, PA-C ?[facility] ?Pager: [phone] ?03/08/2022, 11:38 AM ? ?

## 2022-03-08 NOTE — Progress Notes (Signed)
*  PRELIMINARY RESULTS* ?Echocardiogram ?2D Echocardiogram has been performed. ? ?Leon Goodnow, [place] ?03/08/2022, 11:45 AM ?
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
## 2022-04-10 ENCOUNTER — Encounter: Payer: Self-pay | Admitting: Cardiovascular Disease

## 2022-04-10 ENCOUNTER — Ambulatory Visit: Payer: Medicare Other | Admitting: Cardiovascular Disease

## 2022-04-10 VITALS — BP 130/50 | HR 80 | Ht 62.0 in | Wt 157.0 lb

## 2022-04-10 DIAGNOSIS — R079 Chest pain, unspecified: Secondary | ICD-10-CM | POA: Diagnosis not present

## 2022-04-10 DIAGNOSIS — I5032 Chronic diastolic (congestive) heart failure: Secondary | ICD-10-CM

## 2022-04-10 DIAGNOSIS — I25118 Atherosclerotic heart disease of native coronary artery with other forms of angina pectoris: Secondary | ICD-10-CM

## 2022-04-10 DIAGNOSIS — I10 Essential (primary) hypertension: Secondary | ICD-10-CM

## 2022-04-10 DIAGNOSIS — Z950 Presence of cardiac pacemaker: Secondary | ICD-10-CM | POA: Diagnosis not present

## 2022-04-10 DIAGNOSIS — I350 Nonrheumatic aortic (valve) stenosis: Secondary | ICD-10-CM

## 2022-04-10 DIAGNOSIS — Z953 Presence of xenogenic heart valve: Secondary | ICD-10-CM | POA: Diagnosis not present

## 2022-04-10 DIAGNOSIS — I442 Atrioventricular block, complete: Secondary | ICD-10-CM | POA: Diagnosis not present

## 2022-04-10 MED ORDER — NITROGLYCERIN 0.4 MG SL SUBL: 0.4000 mg | SUBLINGUAL_TABLET | SUBLINGUAL | 3 refills | Status: AC | PRN

## 2022-04-10 MED ORDER — ASPIRIN EC 81 MG PO TBEC: 81.0000 mg | DELAYED_RELEASE_TABLET | Freq: Every day | ORAL | 3 refills | Status: DC

## 2022-04-10 NOTE — Patient Instructions (Signed)
If you take more nitro for chest pains, ?Call the office, ?We could do a stress test (chemical stress test) ? ?Medication Instructions:  ?Please start aspirin 81 mg daily ? ?If you need a refill on your cardiac medications before your next appointment, please call your pharmacy.  ? ?Lab work: ?No new labs needed ? ?Testing/Procedures: ?No new testing needed ? ?Follow-Up: ?At [facility], you and your health needs are our priority.  As part of our continuing mission to provide you with exceptional heart care, we have created designated Provider Care Teams.  These Care Teams include your primary Cardiologist (physician) and Advanced Practice Providers (APPs -  Physician Assistants and Nurse Practitioners) who all work together to provide you with the care you need, when you need it. ? ?You will need a follow up appointment in 6 months [place] [facility] or app ? ?Providers on your designated Care Team:   ?Murray Hodgkins, NP ?Christell Faith, PA-C ?Cadence Kathlen Mody, PA-C ? ?COVID-19 Vaccine Information can be found at: [email or web address] For questions related to vaccine distribution or appointments, please email [email or web address] or call [phone].  ? ?

## 2022-04-12 ENCOUNTER — Ambulatory Visit (INDEPENDENT_AMBULATORY_CARE_PROVIDER_SITE_OTHER): Payer: Medicare Other

## 2022-04-12 DIAGNOSIS — I442 Atrioventricular block, complete: Secondary | ICD-10-CM

## 2022-04-12 LAB — CUP PACEART REMOTE DEVICE CHECK
Battery Remaining Longevity: 49 mo
Battery Remaining Percentage: 39 %
Battery Voltage: 2.95 V
Brady Statistic AP VP Percent: 9.1 %
Brady Statistic AP VS Percent: 1 %
Brady Statistic AS VP Percent: 90 %
Brady Statistic AS VS Percent: 1 %
Brady Statistic RA Percent Paced: 8.7 %
Brady Statistic RV Percent Paced: 99 %
Date Time Interrogation Session: 20230511020017
Implantable Lead Implant Date: 20160928
Implantable Lead Implant Date: 20160928
Implantable Lead Location: 753859
Implantable Lead Location: 753860
Implantable Pulse Generator Implant Date: 20160928
Lead Channel Impedance Value: 410 Ohm
Lead Channel Impedance Value: 540 Ohm
Lead Channel Pacing Threshold Amplitude: 0.75 V
Lead Channel Pacing Threshold Amplitude: 0.75 V
Lead Channel Pacing Threshold Pulse Width: 0.4 ms
Lead Channel Pacing Threshold Pulse Width: 0.4 ms
Lead Channel Sensing Intrinsic Amplitude: 12 mV
Lead Channel Sensing Intrinsic Amplitude: 3.9 mV
Lead Channel Setting Pacing Amplitude: 1 V
Lead Channel Setting Pacing Amplitude: 1.75 V
Lead Channel Setting Pacing Pulse Width: 0.4 ms
Lead Channel Setting Sensing Sensitivity: 2.5 mV
Pulse Gen Model: 2240
Pulse Gen Serial Number: 7779295

## 2022-04-19 NOTE — Progress Notes (Signed)
Remote pacemaker transmission.   

## 2022-05-02 ENCOUNTER — Encounter (HOSPITAL_COMMUNITY): Payer: Self-pay | Admitting: Psychiatry

## 2022-05-02 ENCOUNTER — Telehealth (HOSPITAL_BASED_OUTPATIENT_CLINIC_OR_DEPARTMENT_OTHER): Payer: Medicare Other | Admitting: Psychiatry

## 2022-05-02 VITALS — Wt 157.0 lb

## 2022-05-02 DIAGNOSIS — F331 Major depressive disorder, recurrent, moderate: Secondary | ICD-10-CM | POA: Diagnosis not present

## 2022-05-02 DIAGNOSIS — F09 Unspecified mental disorder due to known physiological condition: Secondary | ICD-10-CM

## 2022-05-02 DIAGNOSIS — F419 Anxiety disorder, unspecified: Secondary | ICD-10-CM | POA: Diagnosis not present

## 2022-05-02 DIAGNOSIS — I10 Essential (primary) hypertension: Secondary | ICD-10-CM | POA: Diagnosis not present

## 2022-05-02 DIAGNOSIS — E782 Mixed hyperlipidemia: Secondary | ICD-10-CM | POA: Diagnosis not present

## 2022-05-02 DIAGNOSIS — E039 Hypothyroidism, unspecified: Secondary | ICD-10-CM | POA: Diagnosis not present

## 2022-05-02 MED ORDER — CLONAZEPAM 0.5 MG PO TABS: 0.5000 mg | ORAL_TABLET | Freq: Every day | ORAL | 2 refills | Status: DC

## 2022-05-02 MED ORDER — SERTRALINE HCL 50 MG PO TABS: 125.0000 mg | ORAL_TABLET | Freq: Every day | ORAL | 0 refills | Status: DC
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
## 2022-05-03 ENCOUNTER — Other Ambulatory Visit (HOSPITAL_COMMUNITY): Payer: Self-pay | Admitting: Psychiatry

## 2022-05-03 DIAGNOSIS — F331 Major depressive disorder, recurrent, moderate: Secondary | ICD-10-CM

## 2022-05-04 ENCOUNTER — Other Ambulatory Visit (HOSPITAL_COMMUNITY): Payer: Self-pay | Admitting: Psychiatry

## 2022-05-04 DIAGNOSIS — F331 Major depressive disorder, recurrent, moderate: Secondary | ICD-10-CM

## 2022-05-22 ENCOUNTER — Encounter: Payer: Self-pay | Admitting: Emergency Medicine

## 2022-05-22 ENCOUNTER — Other Ambulatory Visit: Payer: Self-pay

## 2022-05-22 ENCOUNTER — Emergency Department
Admission: EM | Admit: 2022-05-22 | Discharge: 2022-05-22 | Disposition: A | Discharge status: Home / Self Care | Payer: Medicare Other | Attending: Emergency Medicine | Admitting: Emergency Medicine

## 2022-05-22 ENCOUNTER — Emergency Department: Payer: Medicare Other

## 2022-05-22 DIAGNOSIS — E1122 Type 2 diabetes mellitus with diabetic chronic kidney disease: Secondary | ICD-10-CM | POA: Diagnosis not present

## 2022-05-22 DIAGNOSIS — I2510 Atherosclerotic heart disease of native coronary artery without angina pectoris: Secondary | ICD-10-CM | POA: Insufficient documentation

## 2022-05-22 DIAGNOSIS — J45909 Unspecified asthma, uncomplicated: Secondary | ICD-10-CM | POA: Insufficient documentation

## 2022-05-22 DIAGNOSIS — I5032 Chronic diastolic (congestive) heart failure: Secondary | ICD-10-CM | POA: Insufficient documentation

## 2022-05-22 DIAGNOSIS — R1032 Left lower quadrant pain: Secondary | ICD-10-CM | POA: Insufficient documentation

## 2022-05-22 DIAGNOSIS — N189 Chronic kidney disease, unspecified: Secondary | ICD-10-CM | POA: Diagnosis not present

## 2022-05-22 DIAGNOSIS — E039 Hypothyroidism, unspecified: Secondary | ICD-10-CM | POA: Insufficient documentation

## 2022-05-22 DIAGNOSIS — Z950 Presence of cardiac pacemaker: Secondary | ICD-10-CM | POA: Diagnosis not present

## 2022-05-22 DIAGNOSIS — R42 Dizziness and giddiness: Secondary | ICD-10-CM | POA: Insufficient documentation

## 2022-05-22 DIAGNOSIS — Z859 Personal history of malignant neoplasm, unspecified: Secondary | ICD-10-CM | POA: Insufficient documentation

## 2022-05-22 DIAGNOSIS — R61 Generalized hyperhidrosis: Secondary | ICD-10-CM | POA: Insufficient documentation

## 2022-05-22 DIAGNOSIS — I130 Hypertensive heart and chronic kidney disease with heart failure and stage 1 through stage 4 chronic kidney disease, or unspecified chronic kidney disease: Secondary | ICD-10-CM | POA: Insufficient documentation

## 2022-05-22 LAB — URINALYSIS, ROUTINE W REFLEX MICROSCOPIC
Bilirubin Urine: NEGATIVE
Glucose, UA: NEGATIVE mg/dL
Hgb urine dipstick: NEGATIVE
Ketones, ur: NEGATIVE mg/dL
Leukocytes,Ua: NEGATIVE
Nitrite: NEGATIVE
Protein, ur: NEGATIVE mg/dL
Specific Gravity, Urine: 1.045 — ABNORMAL HIGH (ref 1.005–1.030)
pH: 8 (ref 5.0–8.0)

## 2022-05-22 LAB — CBC WITH DIFFERENTIAL/PLATELET
Abs Immature Granulocytes: 0.04 10*3/uL (ref 0.00–0.07)
Basophils Absolute: 0.1 10*3/uL (ref 0.0–0.1)
Basophils Relative: 1 %
Eosinophils Absolute: 0.2 10*3/uL (ref 0.0–0.5)
Eosinophils Relative: 2 %
HCT: 39.5 % (ref 36.0–46.0)
Hemoglobin: 12.1 g/dL (ref 12.0–15.0)
Immature Granulocytes: 0 %
Lymphocytes Relative: 17 %
Lymphs Abs: 1.6 10*3/uL (ref 0.7–4.0)
MCH: 27.9 pg (ref 26.0–34.0)
MCHC: 30.6 g/dL (ref 30.0–36.0)
MCV: 91 fL (ref 80.0–100.0)
Monocytes Absolute: 0.6 10*3/uL (ref 0.1–1.0)
Monocytes Relative: 7 %
Neutro Abs: 6.5 10*3/uL (ref 1.7–7.7)
Neutrophils Relative %: 73 %
Platelets: 282 10*3/uL (ref 150–400)
RBC: 4.34 MIL/uL (ref 3.87–5.11)
RDW: 13.7 % (ref 11.5–15.5)
WBC: 8.9 10*3/uL (ref 4.0–10.5)
nRBC: 0 % (ref 0.0–0.2)

## 2022-05-22 LAB — COMPREHENSIVE METABOLIC PANEL
ALT: 10 U/L (ref 0–44)
AST: 16 U/L (ref 15–41)
Albumin: 4 g/dL (ref 3.5–5.0)
Alkaline Phosphatase: 54 U/L (ref 38–126)
Anion gap: 7 (ref 5–15)
BUN: 14 mg/dL (ref 8–23)
CO2: 29 mmol/L (ref 22–32)
Calcium: 9.6 mg/dL (ref 8.9–10.3)
Chloride: 104 mmol/L (ref 98–111)
Creatinine, Ser: 0.79 mg/dL (ref 0.44–1.00)
GFR, Estimated: >60 mL/min (ref >60)
Glucose, Bld: 105 mg/dL — ABNORMAL HIGH (ref 70–99)
Potassium: 4.4 mmol/L (ref 3.5–5.1)
Sodium: 140 mmol/L (ref 135–145)
Total Bilirubin: 0.4 mg/dL (ref 0.3–1.2)
Total Protein: 7.4 g/dL (ref 6.5–8.1)

## 2022-05-22 LAB — PROTIME-INR
INR: 1.1 (ref 0.8–1.2)
Prothrombin Time: 13.8 seconds (ref 11.4–15.2)

## 2022-05-22 LAB — TYPE AND SCREEN
ABO/RH(D): A POS
Antibody Screen: NEGATIVE

## 2022-05-22 MED ORDER — ACETAMINOPHEN 10 MG/ML IV SOLN
1000.0000 mg | Freq: Once | INTRAVENOUS | Status: AC
  Administered 2022-05-22: 1000 mg via INTRAVENOUS
  Filled 2022-05-22: qty 100

## 2022-05-22 MED ORDER — ACETAMINOPHEN 325 MG PO TABS: 650.0000 mg | ORAL_TABLET | ORAL | 0 refills | Status: AC | PRN

## 2022-05-22 MED ORDER — IOHEXOL 300 MG/ML  SOLN
100.0000 mL | Freq: Once | INTRAMUSCULAR | Status: AC | PRN
  Administered 2022-05-22: 100 mL via INTRAVENOUS

## 2022-05-22 NOTE — ED Triage Notes (Signed)
Pt here with abd pain and bloody, dark stools. Pt has had 3-4 episodes of emesis. Pt believes the bleeding is related to her colon. Pt's husband states she is sick every morning but gets better throughout the day. Pt states pain is in the lower portion of her abd.

## 2022-05-22 NOTE — Discharge Instructions (Signed)
Your blood work, urinalysis, and CAT scan are normal.  Please call the gastroenterologist to arrange outpatient follow-up and colonoscopy.  Please return the emergency department immediately for any new, worsening, or change in symptoms or other concerns.
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
## 2022-05-23 ENCOUNTER — Telehealth: Payer: Self-pay | Admitting: Gastroenterology

## 2022-05-23 NOTE — Telephone Encounter (Signed)
Patient was in ED on 05/22/2022. Discharge summary states she needs to schedule a colonoscopy with Dr Vicente Males and an outpatient follow up. Please call patient back to get scheduled.

## 2022-06-01 DIAGNOSIS — E039 Hypothyroidism, unspecified: Secondary | ICD-10-CM | POA: Diagnosis not present

## 2022-06-01 DIAGNOSIS — E782 Mixed hyperlipidemia: Secondary | ICD-10-CM | POA: Diagnosis not present

## 2022-06-01 DIAGNOSIS — I10 Essential (primary) hypertension: Secondary | ICD-10-CM | POA: Diagnosis not present

## 2022-06-11 ENCOUNTER — Emergency Department: Payer: Medicare Other

## 2022-06-11 ENCOUNTER — Emergency Department
Admission: EM | Admit: 2022-06-11 | Discharge: 2022-06-11 | Disposition: A | Discharge status: Home / Self Care | Payer: Medicare Other | Attending: Emergency Medicine | Admitting: Emergency Medicine

## 2022-06-11 DIAGNOSIS — M545 Low back pain, unspecified: Secondary | ICD-10-CM | POA: Diagnosis not present

## 2022-06-11 DIAGNOSIS — N3090 Cystitis, unspecified without hematuria: Secondary | ICD-10-CM

## 2022-06-11 DIAGNOSIS — M8588 Other specified disorders of bone density and structure, other site: Secondary | ICD-10-CM | POA: Diagnosis not present

## 2022-06-11 DIAGNOSIS — E039 Hypothyroidism, unspecified: Secondary | ICD-10-CM | POA: Diagnosis not present

## 2022-06-11 DIAGNOSIS — K921 Melena: Secondary | ICD-10-CM | POA: Diagnosis not present

## 2022-06-11 DIAGNOSIS — I774 Celiac artery compression syndrome: Secondary | ICD-10-CM | POA: Diagnosis not present

## 2022-06-11 DIAGNOSIS — M47816 Spondylosis without myelopathy or radiculopathy, lumbar region: Secondary | ICD-10-CM | POA: Diagnosis not present

## 2022-06-11 DIAGNOSIS — R1110 Vomiting, unspecified: Secondary | ICD-10-CM | POA: Diagnosis not present

## 2022-06-11 DIAGNOSIS — R4182 Altered mental status, unspecified: Secondary | ICD-10-CM | POA: Insufficient documentation

## 2022-06-11 DIAGNOSIS — R1084 Generalized abdominal pain: Secondary | ICD-10-CM | POA: Diagnosis not present

## 2022-06-11 DIAGNOSIS — R079 Chest pain, unspecified: Secondary | ICD-10-CM | POA: Insufficient documentation

## 2022-06-11 DIAGNOSIS — I5032 Chronic diastolic (congestive) heart failure: Secondary | ICD-10-CM | POA: Diagnosis not present

## 2022-06-11 DIAGNOSIS — E119 Type 2 diabetes mellitus without complications: Secondary | ICD-10-CM | POA: Insufficient documentation

## 2022-06-11 DIAGNOSIS — I2510 Atherosclerotic heart disease of native coronary artery without angina pectoris: Secondary | ICD-10-CM | POA: Insufficient documentation

## 2022-06-11 DIAGNOSIS — Z950 Presence of cardiac pacemaker: Secondary | ICD-10-CM | POA: Diagnosis not present

## 2022-06-11 DIAGNOSIS — M549 Dorsalgia, unspecified: Secondary | ICD-10-CM | POA: Diagnosis not present

## 2022-06-11 DIAGNOSIS — N390 Urinary tract infection, site not specified: Secondary | ICD-10-CM | POA: Diagnosis not present

## 2022-06-11 DIAGNOSIS — Z743 Need for continuous supervision: Secondary | ICD-10-CM | POA: Diagnosis not present

## 2022-06-11 DIAGNOSIS — R109 Unspecified abdominal pain: Secondary | ICD-10-CM | POA: Diagnosis not present

## 2022-06-11 DIAGNOSIS — R6889 Other general symptoms and signs: Secondary | ICD-10-CM | POA: Diagnosis not present

## 2022-06-11 DIAGNOSIS — R0789 Other chest pain: Secondary | ICD-10-CM | POA: Diagnosis not present

## 2022-06-11 DIAGNOSIS — M4316 Spondylolisthesis, lumbar region: Secondary | ICD-10-CM | POA: Diagnosis not present

## 2022-06-11 LAB — URINALYSIS, COMPLETE (UACMP) WITH MICROSCOPIC
Bilirubin Urine: NEGATIVE
Glucose, UA: NEGATIVE mg/dL
Ketones, ur: NEGATIVE mg/dL
Nitrite: NEGATIVE
Protein, ur: NEGATIVE mg/dL
Specific Gravity, Urine: 1.025 (ref 1.005–1.030)
pH: 8 (ref 5.0–8.0)

## 2022-06-11 LAB — CBC WITH DIFFERENTIAL/PLATELET
Abs Immature Granulocytes: 0.02 10*3/uL (ref 0.00–0.07)
Basophils Absolute: 0 10*3/uL (ref 0.0–0.1)
Basophils Relative: 1 %
Eosinophils Absolute: 0.1 10*3/uL (ref 0.0–0.5)
Eosinophils Relative: 1 %
HCT: 36.7 % (ref 36.0–46.0)
Hemoglobin: 11.8 g/dL — ABNORMAL LOW (ref 12.0–15.0)
Immature Granulocytes: 0 %
Lymphocytes Relative: 21 %
Lymphs Abs: 1.7 10*3/uL (ref 0.7–4.0)
MCH: 28.9 pg (ref 26.0–34.0)
MCHC: 32.2 g/dL (ref 30.0–36.0)
MCV: 89.7 fL (ref 80.0–100.0)
Monocytes Absolute: 0.6 10*3/uL (ref 0.1–1.0)
Monocytes Relative: 8 %
Neutro Abs: 5.5 10*3/uL (ref 1.7–7.7)
Neutrophils Relative %: 69 %
Platelets: 233 10*3/uL (ref 150–400)
RBC: 4.09 MIL/uL (ref 3.87–5.11)
RDW: 13.6 % (ref 11.5–15.5)
WBC: 8 10*3/uL (ref 4.0–10.5)
nRBC: 0 % (ref 0.0–0.2)

## 2022-06-11 LAB — T4, FREE: Free T4: 2.23 ng/dL — ABNORMAL HIGH (ref 0.61–1.12)

## 2022-06-11 LAB — TROPONIN I (HIGH SENSITIVITY)
Troponin I (High Sensitivity): 11 ng/L (ref <18)
Troponin I (High Sensitivity): 13 ng/L (ref <18)

## 2022-06-11 LAB — COMPREHENSIVE METABOLIC PANEL
ALT: 9 U/L (ref 0–44)
AST: 19 U/L (ref 15–41)
Albumin: 4 g/dL (ref 3.5–5.0)
Alkaline Phosphatase: 50 U/L (ref 38–126)
Anion gap: 11 (ref 5–15)
BUN: 12 mg/dL (ref 8–23)
CO2: 26 mmol/L (ref 22–32)
Calcium: 9.4 mg/dL (ref 8.9–10.3)
Chloride: 102 mmol/L (ref 98–111)
Creatinine, Ser: 0.71 mg/dL (ref 0.44–1.00)
GFR, Estimated: >60 mL/min (ref >60)
Glucose, Bld: 99 mg/dL (ref 70–99)
Potassium: 3.8 mmol/L (ref 3.5–5.1)
Sodium: 139 mmol/L (ref 135–145)
Total Bilirubin: 0.8 mg/dL (ref 0.3–1.2)
Total Protein: 7.2 g/dL (ref 6.5–8.1)

## 2022-06-11 LAB — PROTIME-INR
INR: 1.1 (ref 0.8–1.2)
Prothrombin Time: 14.5 seconds (ref 11.4–15.2)

## 2022-06-11 LAB — LIPASE, BLOOD: Lipase: 26 U/L (ref 11–51)

## 2022-06-11 LAB — AMMONIA: Ammonia: 16 umol/L (ref 9–35)

## 2022-06-11 LAB — BRAIN NATRIURETIC PEPTIDE: B Natriuretic Peptide: 101.5 pg/mL — ABNORMAL HIGH (ref 0.0–100.0)

## 2022-06-11 LAB — MAGNESIUM: Magnesium: 2 mg/dL (ref 1.7–2.4)

## 2022-06-11 LAB — TSH: TSH: 0.117 u[IU]/mL — ABNORMAL LOW (ref 0.350–4.500)

## 2022-06-11 MED ORDER — IOHEXOL 350 MG/ML SOLN
100.0000 mL | Freq: Once | INTRAVENOUS | Status: AC | PRN
  Administered 2022-06-11: 100 mL via INTRAVENOUS

## 2022-06-11 MED ORDER — CEPHALEXIN 500 MG PO CAPS: 500.0000 mg | ORAL_CAPSULE | Freq: Two times a day (BID) | ORAL | 0 refills | Status: DC

## 2022-06-11 MED ORDER — ACETAMINOPHEN 500 MG PO TABS
1000.0000 mg | ORAL_TABLET | Freq: Once | ORAL | Status: AC
  Administered 2022-06-11: 1000 mg via ORAL
  Filled 2022-06-11: qty 2

## 2022-06-11 NOTE — ED Notes (Signed)
Pt alert, IV removed, pt given discharge instructions, pt assisted by RN to vehicle.

## 2022-06-11 NOTE — Discharge Instructions (Addendum)
Your urine test today shows signs of a bladder infection.  Your blood tests and CT scans were all okay.  Please take the antibiotics as prescribed and follow-up with your doctor this week for further evaluation of your symptoms.

## 2022-06-11 NOTE — ED Provider Notes (Signed)
Procedures     ----------------------------------------- 6:39 PM on 06/11/2022 -----------------------------------------  Serial troponins are normal.  CT scans are all unremarkable.  Urinalysis suggestive of cystitis, will prescribe Keflex.  Pacemaker interrogation shows no acute abnormalities.  Patient does not require admission, will recommend follow-up with her primary care doctor this week.    Carrie Mew, MD 06/11/22 [phone]

## 2022-06-11 NOTE — ED Provider Notes (Addendum)
[facility] Provider Note    Event Date/Time   First MD Initiated Contact with Patient 06/11/22 1215     (approximate)   History   Chest Pain (C/o chest pain, back pain, confusion, and nausea since 8am today, confusion started today approx 1055am. Pt normally A&O. )   HPI  Catherine [name] is a 81 y.o. female  with past medical history of AS s/p TAVR, CHB/PPM, CAD 70% LAD, mild RCA, diabetes mellitus, chronic diastolic heart failure, hypothyroidism and rheumatoid arthritis typically ambulates with a walker who presents via EMS from home for evaluation of multiple complaints including chest pain, abdominal pain, back pain and increased acute difficulty ambulating.  Patient is somewhat poor historian and is oriented to year and place only and states that she started feeling worse today does not exactly sure when.  She denies any recent falls or injuries, headache, earache, sore throat, vomiting or diarrhea.  She denies any urinary symptoms.  She states she feels pain throughout her back and weak in both legs more than usual.  I also spoke with her husband over the phone who states he was complaining of the symptoms earlier today the chest pain has seemed to resolve and that he felt that she was definitely weaker sometime earlier this morning but in both legs.  He denies that he is any more confused than usual.  He states he is not sure if it is normal for her but sometimes forget things.      Physical Exam  Triage Vital Signs: ED Triage Vitals  Enc Vitals Group     BP      Pulse      Resp      Temp      Temp src      SpO2      Weight      Height      Head Circumference      Peak Flow      Pain Score      Pain Loc      Pain Edu?      Excl. in [place]?     Most recent vital signs: Vitals:   06/11/22 1224 06/11/22 1432  BP: (!) 141/68 140/68  Pulse: 98 75  Resp: 17 18  Temp: 98.7 F (37.1 C)   SpO2: 100% 99%    General: Awake, appears slightly  uncomfortable. CV:  Good peripheral perfusion.  2+ radial pulses. Resp:  Normal effort.  Clear bilaterally Abd:  No distention.  Mildly tender throughout. Other:   Patient is able to give this examiner both hands and move toes in both feet and only able to keep the heels off the bed for 1 second each leg.  She is fairly tender throughout her back but there is no significant overlying skin changes.  She is oriented to year and place only.  PERRLA.  EOMI   ED Results / Procedures / Treatments  Labs (all labs ordered are listed, but only abnormal results are displayed) Labs Reviewed  CBC WITH DIFFERENTIAL/PLATELET - Abnormal; Notable for the following components:      Result Value   Hemoglobin 11.8 (*)    All other components within normal limits  TSH - Abnormal; Notable for the following components:   TSH 0.117 (*)    All other components within normal limits  BRAIN NATRIURETIC PEPTIDE - Abnormal; Notable for the following components:   B Natriuretic Peptide 101.5 (*)    All other  components within normal limits  T4, FREE - Abnormal; Notable for the following components:   Free T4 2.23 (*)    All other components within normal limits  COMPREHENSIVE METABOLIC PANEL  MAGNESIUM  AMMONIA  LIPASE, BLOOD  PROTIME-INR  URINALYSIS, COMPLETE (UACMP) WITH MICROSCOPIC  TROPONIN I (HIGH SENSITIVITY)  TROPONIN I (HIGH SENSITIVITY)     EKG  EKG is remarkable sinus rhythm with a ventricular rate of 99 with PVCs, left bundle branch block, QTc of 519 nonspecific ST changes throughout.  RADIOLOGY  Chest reviewed by myself shows no focal consoidation, effusion, edema, pneumothorax or other clear acute thoracic process. I also reviewed radiology interpretation and agree with findings described.  CT head on my interpretation without evidence of hemorrhage, edema, mass effect or other clear acute intracranial process.  I reviewed CTs and interpretation and agree to findings of mild chronic small  vessel ischemic changes and mild to moderate generalized atrophy as well as mild ethmoid sinusitis.  CT T-spine on my interpretation without evidence of acute fracture or dislocation.  I reviewed radiologist rotation and agree with the findings of some bridging osteophytes consistent with diffuse idiopathic skeletal hyper ptosis without other acute process.  CT L-spine on my interpretation without evidence of acute lumbar fracture.  There is lumbar spine spondylosis and aortic atherosclerosis without other acute process.  I also reviewed radiologist interpretation and agree with the findings.  CTA chest abdomen pelvis on my interpretation without evidence of dissection, AAA, large PE, pneumonia, pneumothorax, overt edema, colitis, kidney stone, perinephric stranding, diverticulitis, pancreatitis or other clear acute abdominal or pelvic process.  I also reviewed radiology interpretation and agree with their findings of CAD and aortic atherosclerosis as well as stenosis of the celiac trunk with reconstitution but no other acute chest abdomen or pelvic process.  PROCEDURES:  Critical Care performed: No  .1-3 Lead EKG Interpretation  Performed by: Lucrezia Starch, MD Authorized by: Lucrezia Starch, MD     Interpretation: non-specific     ECG rate assessment: normal     Rhythm: sinus rhythm     Ectopy: PVCs     Conduction: normal     The patient is on the cardiac monitor to evaluate for evidence of arrhythmia and/or significant heart rate changes.   MEDICATIONS ORDERED IN ED: Medications  iohexol (OMNIPAQUE) 350 MG/ML injection 100 mL (100 mLs Intravenous Contrast Given 06/11/22 1439)     IMPRESSION / MDM / ASSESSMENT AND PLAN / ED COURSE  I reviewed the triage vital signs and the nursing notes. Patient's presentation is most consistent with acute presentation with potential threat to life or bodily function.                               Differential diagnosis includes, but is not  limited to ACS, dissection, pathological spinal fracture, cystitis, pneumonia, colitis, UTI, kidney stone.  Patient does seem a little confused here and is unclear if this is slightly off baseline.  Will obtain CT head as well to assess for evidence of acute intracranial hemorrhage edema or mass effect.  She is globally weak in her legs but there is no focal clear acute deficits to suggest an acute CVA at this time.  EKG is remarkable sinus rhythm with a ventricular rate of 99 with PVCs, left bundle branch block, QTc of 519 nonspecific ST changes throughout.  Initial troponin is nonelevated not suggestive of ACS.  CMP shows  no significant lecture light or metabolic derangements or evidence of acute hepatitis or cholestatic process.  CBC without leukocytosis and hemoglobin of 11.8 compared to 12.12 weeks ago with normal platelets.  Magnesium 2.  Ammonia WNL.  BNP 101.  Lipase 26.  TSH is low at 0.117 and free T4 is elevated at 2.3 suggestive of some mild hypothyroidism.  Chest reviewed by myself shows no focal consoidation, effusion, edema, pneumothorax or other clear acute thoracic process. I also reviewed radiology interpretation and agree with findings described.  CT head on my interpretation without evidence of hemorrhage, edema, mass effect or other clear acute intracranial process.  I reviewed CTs and interpretation and agree to findings of mild chronic small vessel ischemic changes and mild to moderate generalized atrophy as well as mild ethmoid sinusitis.  CT T-spine on my interpretation without evidence of acute fracture or dislocation.  I reviewed radiologist rotation and agree with the findings of some bridging osteophytes consistent with diffuse idiopathic skeletal hyper ptosis without other acute process.  CT L-spine on my interpretation without evidence of acute lumbar fracture.  There is lumbar spine spondylosis and aortic atherosclerosis without other acute process.  I also reviewed  radiologist interpretation and agree with the findings.  CTA chest abdomen pelvis on my interpretation without evidence of dissection, AAA, large PE, pneumonia, pneumothorax, overt edema, colitis, kidney stone, perinephric stranding, diverticulitis, pancreatitis or other clear acute abdominal or pelvic process.  I also reviewed radiology interpretation and agree with their findings of CAD and aortic atherosclerosis as well as stenosis of the celiac trunk with reconstitution but no other acute chest abdomen or pelvic process.  Care patient signed over to assuming provider at approximately 1530 with plan to follow-up repeat troponin and urine studies and reassess.      FINAL CLINICAL IMPRESSION(S) / ED DIAGNOSES   Final diagnoses:  Chest pain, unspecified type  Generalized abdominal pain  Acute bilateral low back pain, unspecified whether sciatica present     Rx / DC Orders   ED Discharge Orders     None        Note:  This document was prepared using Dragon voice recognition software and may include unintentional dictation errors.   Lucrezia Starch, MD 06/11/22 1532    Lucrezia Starch, MD 06/11/22 [phone]

## 2022-06-13 ENCOUNTER — Other Ambulatory Visit: Payer: Self-pay | Admitting: Cardiovascular Disease

## 2022-06-18 DIAGNOSIS — I10 Essential (primary) hypertension: Secondary | ICD-10-CM | POA: Diagnosis not present

## 2022-06-18 DIAGNOSIS — E785 Hyperlipidemia, unspecified: Secondary | ICD-10-CM | POA: Diagnosis not present

## 2022-06-18 DIAGNOSIS — E119 Type 2 diabetes mellitus without complications: Secondary | ICD-10-CM | POA: Diagnosis not present

## 2022-06-18 DIAGNOSIS — E039 Hypothyroidism, unspecified: Secondary | ICD-10-CM | POA: Diagnosis not present

## 2022-06-27 ENCOUNTER — Encounter: Payer: Self-pay | Admitting: Cardiology

## 2022-07-03 ENCOUNTER — Other Ambulatory Visit (HOSPITAL_COMMUNITY): Payer: Self-pay | Admitting: Psychiatry

## 2022-07-03 DIAGNOSIS — F331 Major depressive disorder, recurrent, moderate: Secondary | ICD-10-CM

## 2022-07-08 ENCOUNTER — Emergency Department (HOSPITAL_COMMUNITY): Payer: Medicare Other

## 2022-07-08 ENCOUNTER — Other Ambulatory Visit: Payer: Self-pay

## 2022-07-08 ENCOUNTER — Inpatient Hospital Stay (HOSPITAL_COMMUNITY)
Admission: EM | Admit: 2022-07-08 | Discharge: 2022-07-16 | DRG: 896 | Disposition: A | Discharge status: Skilled Nursing Facility | Payer: Medicare Other | Attending: Internal Medicine | Admitting: Internal Medicine

## 2022-07-08 ENCOUNTER — Encounter (HOSPITAL_COMMUNITY): Payer: Self-pay

## 2022-07-08 ENCOUNTER — Inpatient Hospital Stay (HOSPITAL_COMMUNITY): Payer: Medicare Other

## 2022-07-08 DIAGNOSIS — E119 Type 2 diabetes mellitus without complications: Secondary | ICD-10-CM | POA: Diagnosis present

## 2022-07-08 DIAGNOSIS — Z90710 Acquired absence of both cervix and uterus: Secondary | ICD-10-CM

## 2022-07-08 DIAGNOSIS — E569 Vitamin deficiency, unspecified: Secondary | ICD-10-CM | POA: Diagnosis not present

## 2022-07-08 DIAGNOSIS — G9341 Metabolic encephalopathy: Secondary | ICD-10-CM | POA: Diagnosis not present

## 2022-07-08 DIAGNOSIS — R519 Headache, unspecified: Secondary | ICD-10-CM | POA: Diagnosis not present

## 2022-07-08 DIAGNOSIS — F05 Delirium due to known physiological condition: Secondary | ICD-10-CM | POA: Diagnosis not present

## 2022-07-08 DIAGNOSIS — M6259 Muscle wasting and atrophy, not elsewhere classified, multiple sites: Secondary | ICD-10-CM | POA: Diagnosis not present

## 2022-07-08 DIAGNOSIS — M4312 Spondylolisthesis, cervical region: Secondary | ICD-10-CM | POA: Diagnosis not present

## 2022-07-08 DIAGNOSIS — Z90721 Acquired absence of ovaries, unilateral: Secondary | ICD-10-CM

## 2022-07-08 DIAGNOSIS — L89159 Pressure ulcer of sacral region, unspecified stage: Secondary | ICD-10-CM | POA: Diagnosis not present

## 2022-07-08 DIAGNOSIS — M542 Cervicalgia: Secondary | ICD-10-CM | POA: Diagnosis not present

## 2022-07-08 DIAGNOSIS — Z7982 Long term (current) use of aspirin: Secondary | ICD-10-CM

## 2022-07-08 DIAGNOSIS — X58XXXA Exposure to other specified factors, initial encounter: Secondary | ICD-10-CM | POA: Diagnosis present

## 2022-07-08 DIAGNOSIS — R2990 Unspecified symptoms and signs involving the nervous system: Secondary | ICD-10-CM | POA: Diagnosis present

## 2022-07-08 DIAGNOSIS — G319 Degenerative disease of nervous system, unspecified: Secondary | ICD-10-CM | POA: Diagnosis not present

## 2022-07-08 DIAGNOSIS — R339 Retention of urine, unspecified: Secondary | ICD-10-CM | POA: Diagnosis present

## 2022-07-08 DIAGNOSIS — K649 Unspecified hemorrhoids: Secondary | ICD-10-CM | POA: Diagnosis present

## 2022-07-08 DIAGNOSIS — Z91040 Latex allergy status: Secondary | ICD-10-CM

## 2022-07-08 DIAGNOSIS — E039 Hypothyroidism, unspecified: Secondary | ICD-10-CM | POA: Diagnosis present

## 2022-07-08 DIAGNOSIS — M069 Rheumatoid arthritis, unspecified: Secondary | ICD-10-CM | POA: Diagnosis not present

## 2022-07-08 DIAGNOSIS — R109 Unspecified abdominal pain: Secondary | ICD-10-CM | POA: Diagnosis present

## 2022-07-08 DIAGNOSIS — F32A Depression, unspecified: Secondary | ICD-10-CM | POA: Diagnosis present

## 2022-07-08 DIAGNOSIS — F419 Anxiety disorder, unspecified: Secondary | ICD-10-CM | POA: Diagnosis present

## 2022-07-08 DIAGNOSIS — W010XXA Fall on same level from slipping, tripping and stumbling without subsequent striking against object, initial encounter: Secondary | ICD-10-CM | POA: Diagnosis present

## 2022-07-08 DIAGNOSIS — R1312 Dysphagia, oropharyngeal phase: Secondary | ICD-10-CM | POA: Diagnosis not present

## 2022-07-08 DIAGNOSIS — N3010 Interstitial cystitis (chronic) without hematuria: Secondary | ICD-10-CM | POA: Diagnosis present

## 2022-07-08 DIAGNOSIS — E878 Other disorders of electrolyte and fluid balance, not elsewhere classified: Secondary | ICD-10-CM | POA: Diagnosis not present

## 2022-07-08 DIAGNOSIS — R4182 Altered mental status, unspecified: Secondary | ICD-10-CM | POA: Diagnosis not present

## 2022-07-08 DIAGNOSIS — Z86010 Personal history of colonic polyps: Secondary | ICD-10-CM

## 2022-07-08 DIAGNOSIS — K5900 Constipation, unspecified: Secondary | ICD-10-CM | POA: Diagnosis not present

## 2022-07-08 DIAGNOSIS — Z818 Family history of other mental and behavioral disorders: Secondary | ICD-10-CM

## 2022-07-08 DIAGNOSIS — I110 Hypertensive heart disease with heart failure: Secondary | ICD-10-CM | POA: Diagnosis present

## 2022-07-08 DIAGNOSIS — Z950 Presence of cardiac pacemaker: Secondary | ICD-10-CM | POA: Diagnosis not present

## 2022-07-08 DIAGNOSIS — S0990XA Unspecified injury of head, initial encounter: Secondary | ICD-10-CM | POA: Diagnosis not present

## 2022-07-08 DIAGNOSIS — Z8673 Personal history of transient ischemic attack (TIA), and cerebral infarction without residual deficits: Secondary | ICD-10-CM

## 2022-07-08 DIAGNOSIS — B961 Klebsiella pneumoniae [K. pneumoniae] as the cause of diseases classified elsewhere: Secondary | ICD-10-CM | POA: Diagnosis present

## 2022-07-08 DIAGNOSIS — G459 Transient cerebral ischemic attack, unspecified: Secondary | ICD-10-CM

## 2022-07-08 DIAGNOSIS — Z801 Family history of malignant neoplasm of trachea, bronchus and lung: Secondary | ICD-10-CM

## 2022-07-08 DIAGNOSIS — I5032 Chronic diastolic (congestive) heart failure: Secondary | ICD-10-CM | POA: Diagnosis not present

## 2022-07-08 DIAGNOSIS — I6623 Occlusion and stenosis of bilateral posterior cerebral arteries: Secondary | ICD-10-CM | POA: Diagnosis present

## 2022-07-08 DIAGNOSIS — Z0389 Encounter for observation for other suspected diseases and conditions ruled out: Secondary | ICD-10-CM | POA: Diagnosis not present

## 2022-07-08 DIAGNOSIS — Z953 Presence of xenogenic heart valve: Secondary | ICD-10-CM

## 2022-07-08 DIAGNOSIS — R6889 Other general symptoms and signs: Secondary | ICD-10-CM | POA: Diagnosis not present

## 2022-07-08 DIAGNOSIS — S31000A Unspecified open wound of lower back and pelvis without penetration into retroperitoneum, initial encounter: Secondary | ICD-10-CM | POA: Diagnosis present

## 2022-07-08 DIAGNOSIS — F13130 Sedative, hypnotic or anxiolytic abuse with withdrawal, uncomplicated: Secondary | ICD-10-CM | POA: Diagnosis not present

## 2022-07-08 DIAGNOSIS — R531 Weakness: Secondary | ICD-10-CM

## 2022-07-08 DIAGNOSIS — I672 Cerebral atherosclerosis: Secondary | ICD-10-CM | POA: Diagnosis present

## 2022-07-08 DIAGNOSIS — E785 Hyperlipidemia, unspecified: Secondary | ICD-10-CM | POA: Diagnosis present

## 2022-07-08 DIAGNOSIS — W0110XA Fall on same level from slipping, tripping and stumbling with subsequent striking against unspecified object, initial encounter: Secondary | ICD-10-CM | POA: Diagnosis not present

## 2022-07-08 DIAGNOSIS — Y92009 Unspecified place in unspecified non-institutional (private) residence as the place of occurrence of the external cause: Secondary | ICD-10-CM

## 2022-07-08 DIAGNOSIS — I10 Essential (primary) hypertension: Secondary | ICD-10-CM | POA: Diagnosis not present

## 2022-07-08 DIAGNOSIS — M797 Fibromyalgia: Secondary | ICD-10-CM | POA: Diagnosis present

## 2022-07-08 DIAGNOSIS — R4701 Aphasia: Secondary | ICD-10-CM | POA: Diagnosis present

## 2022-07-08 DIAGNOSIS — H9190 Unspecified hearing loss, unspecified ear: Secondary | ICD-10-CM | POA: Diagnosis present

## 2022-07-08 DIAGNOSIS — R498 Other voice and resonance disorders: Secondary | ICD-10-CM | POA: Diagnosis not present

## 2022-07-08 DIAGNOSIS — I2510 Atherosclerotic heart disease of native coronary artery without angina pectoris: Secondary | ICD-10-CM | POA: Diagnosis present

## 2022-07-08 DIAGNOSIS — W19XXXD Unspecified fall, subsequent encounter: Secondary | ICD-10-CM | POA: Diagnosis not present

## 2022-07-08 DIAGNOSIS — Z9012 Acquired absence of left breast and nipple: Secondary | ICD-10-CM

## 2022-07-08 DIAGNOSIS — E876 Hypokalemia: Secondary | ICD-10-CM | POA: Diagnosis not present

## 2022-07-08 DIAGNOSIS — I442 Atrioventricular block, complete: Secondary | ICD-10-CM | POA: Diagnosis not present

## 2022-07-08 DIAGNOSIS — G8929 Other chronic pain: Secondary | ICD-10-CM | POA: Diagnosis not present

## 2022-07-08 DIAGNOSIS — M47812 Spondylosis without myelopathy or radiculopathy, cervical region: Secondary | ICD-10-CM | POA: Diagnosis not present

## 2022-07-08 DIAGNOSIS — F13239 Sedative, hypnotic or anxiolytic dependence with withdrawal, unspecified: Principal | ICD-10-CM | POA: Diagnosis present

## 2022-07-08 DIAGNOSIS — Z79899 Other long term (current) drug therapy: Secondary | ICD-10-CM

## 2022-07-08 DIAGNOSIS — R410 Disorientation, unspecified: Secondary | ICD-10-CM | POA: Diagnosis not present

## 2022-07-08 DIAGNOSIS — E781 Pure hyperglyceridemia: Secondary | ICD-10-CM | POA: Diagnosis not present

## 2022-07-08 DIAGNOSIS — G8191 Hemiplegia, unspecified affecting right dominant side: Secondary | ICD-10-CM | POA: Diagnosis present

## 2022-07-08 DIAGNOSIS — Z7401 Bed confinement status: Secondary | ICD-10-CM | POA: Diagnosis not present

## 2022-07-08 DIAGNOSIS — R4781 Slurred speech: Secondary | ICD-10-CM | POA: Diagnosis not present

## 2022-07-08 DIAGNOSIS — E7800 Pure hypercholesterolemia, unspecified: Secondary | ICD-10-CM | POA: Diagnosis not present

## 2022-07-08 DIAGNOSIS — Z8249 Family history of ischemic heart disease and other diseases of the circulatory system: Secondary | ICD-10-CM

## 2022-07-08 DIAGNOSIS — R29818 Other symptoms and signs involving the nervous system: Secondary | ICD-10-CM | POA: Diagnosis not present

## 2022-07-08 DIAGNOSIS — Z741 Need for assistance with personal care: Secondary | ICD-10-CM | POA: Diagnosis not present

## 2022-07-08 DIAGNOSIS — R29810 Facial weakness: Secondary | ICD-10-CM | POA: Diagnosis present

## 2022-07-08 DIAGNOSIS — N399 Disorder of urinary system, unspecified: Secondary | ICD-10-CM | POA: Diagnosis not present

## 2022-07-08 DIAGNOSIS — R471 Dysarthria and anarthria: Secondary | ICD-10-CM | POA: Diagnosis present

## 2022-07-08 DIAGNOSIS — I639 Cerebral infarction, unspecified: Secondary | ICD-10-CM | POA: Diagnosis not present

## 2022-07-08 DIAGNOSIS — Z9151 Personal history of suicidal behavior: Secondary | ICD-10-CM

## 2022-07-08 DIAGNOSIS — R251 Tremor, unspecified: Secondary | ICD-10-CM | POA: Diagnosis not present

## 2022-07-08 DIAGNOSIS — Z79890 Hormone replacement therapy (postmenopausal): Secondary | ICD-10-CM

## 2022-07-08 DIAGNOSIS — N390 Urinary tract infection, site not specified: Secondary | ICD-10-CM | POA: Diagnosis not present

## 2022-07-08 DIAGNOSIS — Z823 Family history of stroke: Secondary | ICD-10-CM

## 2022-07-08 DIAGNOSIS — Z881 Allergy status to other antibiotic agents status: Secondary | ICD-10-CM

## 2022-07-08 DIAGNOSIS — Z743 Need for continuous supervision: Secondary | ICD-10-CM | POA: Diagnosis not present

## 2022-07-08 DIAGNOSIS — I679 Cerebrovascular disease, unspecified: Secondary | ICD-10-CM | POA: Diagnosis not present

## 2022-07-08 DIAGNOSIS — R488 Other symbolic dysfunctions: Secondary | ICD-10-CM | POA: Diagnosis not present

## 2022-07-08 DIAGNOSIS — M6289 Other specified disorders of muscle: Secondary | ICD-10-CM | POA: Diagnosis not present

## 2022-07-08 DIAGNOSIS — I6523 Occlusion and stenosis of bilateral carotid arteries: Secondary | ICD-10-CM | POA: Diagnosis not present

## 2022-07-08 LAB — I-STAT CHEM 8, ED
BUN: 17 mg/dL (ref 8–23)
Calcium, Ion: 1.1 mmol/L — ABNORMAL LOW (ref 1.15–1.40)
Chloride: 102 mmol/L (ref 98–111)
Creatinine, Ser: 0.6 mg/dL (ref 0.44–1.00)
Glucose, Bld: 120 mg/dL — ABNORMAL HIGH (ref 70–99)
HCT: 41 % (ref 36.0–46.0)
Hemoglobin: 13.9 g/dL (ref 12.0–15.0)
Potassium: 4.1 mmol/L (ref 3.5–5.1)
Sodium: 137 mmol/L (ref 135–145)
TCO2: 24 mmol/L (ref 22–32)

## 2022-07-08 LAB — COMPREHENSIVE METABOLIC PANEL
ALT: 9 U/L (ref 0–44)
AST: 18 U/L (ref 15–41)
Albumin: 4.1 g/dL (ref 3.5–5.0)
Alkaline Phosphatase: 55 U/L (ref 38–126)
Anion gap: 15 (ref 5–15)
BUN: 15 mg/dL (ref 8–23)
CO2: 22 mmol/L (ref 22–32)
Calcium: 9.9 mg/dL (ref 8.9–10.3)
Chloride: 101 mmol/L (ref 98–111)
Creatinine, Ser: 0.78 mg/dL (ref 0.44–1.00)
GFR, Estimated: >60 mL/min (ref >60)
Glucose, Bld: 119 mg/dL — ABNORMAL HIGH (ref 70–99)
Potassium: 3.9 mmol/L (ref 3.5–5.1)
Sodium: 138 mmol/L (ref 135–145)
Total Bilirubin: 0.8 mg/dL (ref 0.3–1.2)
Total Protein: 7.2 g/dL (ref 6.5–8.1)

## 2022-07-08 LAB — PROTIME-INR
INR: 1.1 (ref 0.8–1.2)
Prothrombin Time: 14 seconds (ref 11.4–15.2)

## 2022-07-08 LAB — LIPID PANEL
Cholesterol: 180 mg/dL (ref 0–200)
HDL: 47 mg/dL (ref >40)
LDL Cholesterol: 112 mg/dL — ABNORMAL HIGH (ref 0–99)
Total CHOL/HDL Ratio: 3.8 RATIO
Triglycerides: 105 mg/dL (ref <150)
VLDL: 21 mg/dL (ref 0–40)

## 2022-07-08 LAB — CBC
HCT: 41.6 % (ref 36.0–46.0)
Hemoglobin: 13.2 g/dL (ref 12.0–15.0)
MCH: 28.4 pg (ref 26.0–34.0)
MCHC: 31.7 g/dL (ref 30.0–36.0)
MCV: 89.5 fL (ref 80.0–100.0)
Platelets: 270 10*3/uL (ref 150–400)
RBC: 4.65 MIL/uL (ref 3.87–5.11)
RDW: 13.2 % (ref 11.5–15.5)
WBC: 10.2 10*3/uL (ref 4.0–10.5)
nRBC: 0 % (ref 0.0–0.2)

## 2022-07-08 LAB — DIFFERENTIAL
Abs Immature Granulocytes: 0.05 10*3/uL (ref 0.00–0.07)
Basophils Absolute: 0 10*3/uL (ref 0.0–0.1)
Basophils Relative: 0 %
Eosinophils Absolute: 0.1 10*3/uL (ref 0.0–0.5)
Eosinophils Relative: 1 %
Immature Granulocytes: 1 %
Lymphocytes Relative: 16 %
Lymphs Abs: 1.6 10*3/uL (ref 0.7–4.0)
Monocytes Absolute: 0.8 10*3/uL (ref 0.1–1.0)
Monocytes Relative: 8 %
Neutro Abs: 7.7 10*3/uL (ref 1.7–7.7)
Neutrophils Relative %: 74 %

## 2022-07-08 LAB — HEMOGLOBIN A1C
Hgb A1c MFr Bld: 5.4 % (ref 4.8–5.6)
Mean Plasma Glucose: 108.28 mg/dL

## 2022-07-08 LAB — ETHANOL: Alcohol, Ethyl (B): <10 mg/dL (ref <10)

## 2022-07-08 LAB — CBG MONITORING, ED: Glucose-Capillary: 120 mg/dL — ABNORMAL HIGH (ref 70–99)

## 2022-07-08 LAB — TSH: TSH: 0.048 u[IU]/mL — ABNORMAL LOW (ref 0.350–4.500)

## 2022-07-08 LAB — APTT: aPTT: 27 seconds (ref 24–36)

## 2022-07-08 MED ORDER — SODIUM CHLORIDE 0.9% FLUSH
3.0000 mL | Freq: Once | INTRAVENOUS | Status: AC
  Administered 2022-07-08: 3 mL via INTRAVENOUS

## 2022-07-08 MED ORDER — CLOPIDOGREL BISULFATE 75 MG PO TABS
75.0000 mg | ORAL_TABLET | Freq: Every day | ORAL | Status: DC
  Administered 2022-07-08 – 2022-07-16 (×9): 75 mg via ORAL
  Filled 2022-07-08 (×9): qty 1

## 2022-07-08 MED ORDER — ACETAMINOPHEN 650 MG RE SUPP: 650.0000 mg | Freq: Four times a day (QID) | RECTAL | Status: DC | PRN

## 2022-07-08 MED ORDER — ATORVASTATIN CALCIUM 40 MG PO TABS
40.0000 mg | ORAL_TABLET | Freq: Every day | ORAL | Status: DC
  Administered 2022-07-08 – 2022-07-09 (×2): 40 mg via ORAL
  Filled 2022-07-08 (×2): qty 1

## 2022-07-08 MED ORDER — IOHEXOL 350 MG/ML SOLN
75.0000 mL | Freq: Once | INTRAVENOUS | Status: AC | PRN
  Administered 2022-07-08: 75 mL via INTRAVENOUS

## 2022-07-08 MED ORDER — ACETAMINOPHEN 325 MG PO TABS
650.0000 mg | ORAL_TABLET | Freq: Four times a day (QID) | ORAL | Status: DC | PRN
  Administered 2022-07-08 – 2022-07-16 (×7): 650 mg via ORAL
  Filled 2022-07-08 (×7): qty 2

## 2022-07-08 MED ORDER — ASPIRIN 81 MG PO TBEC
81.0000 mg | DELAYED_RELEASE_TABLET | Freq: Every day | ORAL | Status: DC
  Administered 2022-07-08 – 2022-07-16 (×9): 81 mg via ORAL
  Filled 2022-07-08 (×9): qty 1

## 2022-07-08 MED ORDER — LEVOTHYROXINE SODIUM 100 MCG PO TABS
100.0000 ug | ORAL_TABLET | Freq: Every day | ORAL | Status: DC
  Administered 2022-07-09 – 2022-07-16 (×8): 100 ug via ORAL
  Filled 2022-07-08 (×8): qty 1

## 2022-07-08 MED ORDER — TRAZODONE HCL 50 MG PO TABS
50.0000 mg | ORAL_TABLET | Freq: Every day | ORAL | Status: DC
  Administered 2022-07-08 – 2022-07-10 (×3): 50 mg via ORAL
  Filled 2022-07-08 (×3): qty 1

## 2022-07-08 MED ORDER — LEVOTHYROXINE SODIUM 112 MCG PO TABS
112.0000 ug | ORAL_TABLET | Freq: Every day | ORAL | Status: DC
  Filled 2022-07-08: qty 1

## 2022-07-08 MED ORDER — SERTRALINE HCL 100 MG PO TABS
125.0000 mg | ORAL_TABLET | Freq: Every day | ORAL | Status: DC
  Administered 2022-07-08 – 2022-07-11 (×4): 125 mg via ORAL
  Filled 2022-07-08 (×4): qty 1

## 2022-07-08 NOTE — ED Provider Notes (Signed)
[facility] EMERGENCY DEPARTMENT Provider Note   CSN: [id number] Arrival date & time: 07/08/22  0915     History  No chief complaint on file.   Catherine [name] is a 81 y.o. female presents via EMS for stroke evaluation.  Last known well at 3 AM.  At 8 AM patient woke back up and was trying to get up to use her walker, was weak and ataxic and fell to the floor hitting her head.  She presents with right-sided facial droop, right-sided weakness, dysarthria versus aphasia and some confusion.  Cording to the husband these are new abnormalities since she woke at 8 AM this morning.  HPI     Home Medications Prior to Admission medications   Medication Sig Start Date End Date Taking? Authorizing Provider  acetaminophen (TYLENOL) 325 MG tablet Take 2 tablets (650 mg total) by mouth every 4 (four) hours as needed. 05/22/22   Poggi, Clarnce Flock, PA-C  acetaminophen (TYLENOL) 500 MG tablet Take 1,000 mg by mouth every 6 (six) hours as needed for mild pain.    [provider]  aspirin EC 81 MG tablet Take 1 tablet (81 mg total) by mouth daily. Swallow whole. 04/10/22   Minna Merritts, MD  atorvastatin (LIPITOR) 40 MG tablet Take 1 tablet by mouth daily. 03/21/21   [provider]  benztropine (COGENTIN) 0.5 MG tablet Take 0.5 mg by mouth daily. 04/27/22   Boswell, Chelsa H, NP  budesonide (PULMICORT) 0.5 MG/2ML nebulizer solution Take 2 mLs by nebulization 2 (two) times daily. 01/11/20   [provider]  cephALEXin (KEFLEX) 500 MG capsule Take 1 capsule (500 mg total) by mouth 2 (two) times daily. 06/11/22   Carrie Mew, MD  clonazePAM (KLONOPIN) 0.5 MG tablet Take 1 tablet (0.5 mg total) by mouth at bedtime. 05/02/22   Arfeen, Arlyce Harman, MD  isosorbide mononitrate (IMDUR) 30 MG 24 hr tablet TAKE 1/2 OF A TABLET (15 MG TOTAL) BY MOUTH DAILY 06/14/22   Minna Merritts, MD  levothyroxine (SYNTHROID) 112 MCG tablet Take 112 mcg by mouth daily. 03/21/21   [provider]  losartan (COZAAR) 25 MG tablet Take 1 tablet (25 mg total) by mouth daily. 03/09/22   Annita Brod, MD  magnesium oxide (MAG-OX) 400 MG tablet Take by mouth.    [provider]  Multiple Vitamin (MULTIVITAMIN WITH MINERALS) TABS tablet Take 1 tablet by mouth daily.    [provider]  nitroGLYCERIN (NITROSTAT) 0.4 MG SL tablet Place 1 tablet (0.4 mg total) under the tongue every 5 (five) minutes as needed for chest pain. 04/10/22   Minna Merritts, MD  ondansetron (ZOFRAN) 4 MG tablet Take 1 tablet (4 mg total) by mouth every 8 (eight) hours as needed for up to 5 doses for nausea or vomiting. 01/27/22   Lucrezia Starch, MD  [facility] VERIO test strip  07/25/21   [provider]  oxybutynin (DITROPAN-XL) 10 MG 24 hr tablet Take 10 mg by mouth daily. 03/14/21   [provider]  potassium chloride (KLOR-CON) 10 MEQ tablet TAKE 1 TABLET BY MOUTH EVERY OTHER DAY 01/11/21   Rise Mu, PA-C  sertraline (ZOLOFT) 50 MG tablet Take 2.5 tablets (125 mg total) by mouth daily. 05/02/22   Arfeen, Arlyce Harman, MD  traZODone (DESYREL) 50 MG tablet Take trazodone 50 mg as needed, take 1-2 tabs at night 08/04/20   Anabel Bene, MD      Allergies  Tetracyclines & related, Latex, Sulfa antibiotics, and Latex    Review of Systems   Review of Systems  Physical Exam Updated Vital Signs There were no vitals taken for this visit. Physical Exam Vitals and nursing note reviewed.  Constitutional:      General: She is not in acute distress.    Appearance: She is well-developed. She is not diaphoretic.  HENT:     Head: Normocephalic and atraumatic.     Right Ear: External ear normal.     Left Ear: External ear normal.     Nose: Nose normal.     Mouth/Throat:     Mouth: Mucous membranes are moist.  Eyes:     General: No scleral icterus.    Conjunctiva/sclera: Conjunctivae normal.  Neck:     Comments: C-collar in place Cardiovascular:     Rate and Rhythm:  Normal rate and regular rhythm.     Heart sounds: Normal heart sounds. No murmur heard.    No friction rub. No gallop.  Pulmonary:     Effort: Pulmonary effort is normal. No respiratory distress.     Breath sounds: Normal breath sounds.  Abdominal:     General: Bowel sounds are normal. There is no distension.     Palpations: Abdomen is soft. There is no mass.     Tenderness: There is no abdominal tenderness. There is no guarding.  Skin:    General: Skin is warm and dry.  Neurological:     Mental Status: She is alert. She is disoriented.     Cranial Nerves: Cranial nerve deficit present.     Sensory: No sensory deficit.     Motor: Weakness present.     Coordination: Coordination abnormal.     Comments: Please see full neurologic exam findings per neuro team  Psychiatric:        Behavior: Behavior normal.     ED Results / Procedures / Treatments   Labs (all labs ordered are listed, but only abnormal results are displayed) Labs Reviewed  I-STAT CHEM 8, ED - Abnormal; Notable for the following components:      Result Value   Glucose, Bld 120 (*)    Calcium, Ion 1.10 (*)    All other components within normal limits  CBG MONITORING, ED - Abnormal; Notable for the following components:   Glucose-Capillary 120 (*)    All other components within normal limits  CBC  DIFFERENTIAL  PROTIME-INR  APTT  COMPREHENSIVE METABOLIC PANEL  ETHANOL    EKG None  Radiology No results found.  Procedures Procedures    Medications Ordered in ED Medications  sodium chloride flush (NS) 0.9 % injection 3 mL (has no administration in time range)    ED Course/ Medical Decision Making/ A&P                           Medical Decision Making This patient presents to the ED for concern of Aphasia, weakness, this involves an extensive number of treatment options, and is a complaint that carries with it a high risk of complications and morbidity.  The differential diagnosis includes The  differential diagnosis of weakness includes but is not limited to neurologic causes (GBS, myasthenia gravis, CVA, MS, ALS, transverse myelitis, spinal cord injury, CVA, botulism, ) and other causes: ACS, Arrhythmia, syncope, orthostatic hypotension, sepsis, hypoglycemia, electrolyte disturbance, hypothyroidism, respiratory failure, symptomatic anemia, dehydration, heat injury, polypharmacy, malignancy.     Co morbidities that complicate the  patient evaluation      CAD, hypothyroidism, type 2 diabetes, CKD, collagen vascular disease, chronic kidney disease, complete heart block status post pacemaker placement   Additional history obtained:  Additional history obtained from EMS    Lab Tests:  I Ordered, and personally interpreted labs.  The pertinent results include: Mild hyperglycemia, ethanol within normal limits,Normal GFR and creatinine, CBC APTT and INR within normal limits   Imaging Studies ordered:  I ordered imaging studies including CT, CT angiogram head and neck with perfusion study I independently visualized and interpreted imaging which showed no acute stroke I agree with the radiologist interpretation   Cardiac Monitoring:       The patient was maintained on a cardiac monitor.  I personally viewed and interpreted the cardiac monitored which showed an underlying rhythm of: Paced rhythm at a rate of 110   Medicines ordered and prescription drug management:    Test Considered:       MRI however patient will need pacemaker adjustments for this imaging    Consultations Obtained:  I requested consultation with the stroke team,  and discussed lab and imaging findings as well as pertinent plan - they recommend: Admission for MRI and stroke work-up   Problem List / ED Course:       Acute aphasia and weakness   Reevaluation:  After the interventions noted above, I reevaluated the patient and found that they have :improved   Social Determinants of  Health:       Lives with husband   Dispostion:  After consideration of the diagnostic results and the patients response to treatment, I feel that the patent would benefit from admission.    Amount and/or Complexity of Data Reviewed Labs: ordered. Radiology: ordered.  Risk Decision regarding hospitalization.           Final Clinical Impression(s) / ED Diagnoses Final diagnoses:  None    Rx / DC Orders ED Discharge Orders     None         Margarita Mail, PA-C 07/08/22 [place], DO 07/09/22 1138

## 2022-07-08 NOTE — ED Notes (Signed)
Dirty bed  as soon as I sent a message to the floor

## 2022-07-08 NOTE — Progress Notes (Signed)
SLP Cancellation Note  Patient Details Name: Catherine [name] MRN: [id number] DOB: [birth year]   Cancelled treatment:       Reason Eval/Treat Not Completed: SLP screened, no needs identified, will sign off  Pt passed [facility] swallow screen and pt's RN reported that the pt has been tolerating the ordered diet well; therefore, no formalized SLP swallow eval is needed per protocol. SLP will follow for speech-language-cognition evaluation only and orders will be adjusted to reflect this.   Catherine [name], Catherine [name], Catherine [name]   Horton Marshall 07/08/2022, 1:19 PM

## 2022-07-08 NOTE — Plan of Care (Signed)

## 2022-07-08 NOTE — ED Notes (Signed)
Pt bib [facility] from home where she lives with her husband with complaints of right sided weakness, right sided facial droop, and aphasia that was noticed at 0800 today. Pt got up to go to the bathroom at 0300 and was normal per husband. Pt uses a walker at baseline.  At 0800 today she stood up from the bed to use her walker to go to the bathroom, was unable to get up and fell backwards. Pt arrives complaining of head and neck pain. Pt is not on thinners and arrives in a ccollar.

## 2022-07-08 NOTE — H&P (Addendum)
Date: 07/08/2022               Patient Name:  Catherine [name] MRN: [id number]  DOB: 1941/05/03 Age / Sex: 81 y.o., female   PCP: Danelle Berry, NP         Medical Service: Internal Medicine Teaching Service         Attending Physician: Dr. Velna Ochs, MD    First Contact: Dr. Angelique Blonder Pager: [phone]  Second Contact: Dr. Candace Cruise Pager: [phone]       After Hours (After 5p/  First Contact Pager: [phone]  weekends / holidays): Second Contact Pager: [phone]   Chief Concern: fall  History of Present Illness:   Catherine [name] is a 81 year old female with medical history of CAD (70% LAD), AS s/p TAVR, diabetes, CVA, HFpEF, hypothyroidism, RA, complete heart block with pacemaker placement 2016, who presents to the hospital after a fall.  Per notes, patient may had facial droop with right-sided weakness and dysarthria.   Patient is alert and oriented to person, place and situation, not to time.  Patient reports waking up at 8 AM this morning and try to grab her walker to stand up.  She states that her hand slipped out of the walker and she fell forward and on her right side.  She also hit her head but no loss of consciousness.  She endorsed mild lightheadedness when she stood up but denies presyncopal symptoms.  She states the lightheadedness only happened every once in a while.  She thinks her hand slipped because she felt weak overall.  Said her legs also felt weaker this morning.  After the fall, she thinks her right side is slightly weaker due to the pain.   Patient states that her health has declined recently.  She report issue with kidneys, emesis or cystitis and hemorrhoids.  She endorses chronic abdominal pain and poor p.o. intake due to no appetite.  She endorses intermittent melena and hematochezia secondary to hemorrhoids.  She denies chest pain or shortness of breath.  She endorses neck pain after the fall.  She also endorses 1 episode of dysuria yesterday  but no frequency.  She contributes this to her interstitial cystitis.  Patient report adherence to her medication.  Her daughter helps pick up the medication and put it in her pillbox.  I also spoke to patient's husband for more information.  He did not witness her fall.  He found her on the ground lying on her right side.  He is not sure about the dysarthria or right-sided weakness.  He said that her speech was normal for him this morning.  He confirms that the daughter is taking care of patient's medications.  In the ED, patient was hypertensive.  CT head was negative for hemorrhage or infarct.  CTA was negative for any emergent finding.  Patient will be admitted for work-up for possible CVA.  Meds:  Current Meds  Medication Sig   acetaminophen (TYLENOL) 325 MG tablet Take 2 tablets (650 mg total) by mouth every 4 (four) hours as needed.   aspirin EC 81 MG tablet Take 1 tablet (81 mg total) by mouth daily. Swallow whole.   atorvastatin (LIPITOR) 40 MG tablet Take 1 tablet by mouth daily.   clonazePAM (KLONOPIN) 0.5 MG tablet Take 1 tablet (0.5 mg total) by mouth at bedtime.   isosorbide mononitrate (IMDUR) 30 MG 24 hr tablet TAKE 1/2 OF A TABLET (15 MG TOTAL) BY MOUTH DAILY (Patient  taking differently: Take 15 mg by mouth daily.)   levothyroxine (SYNTHROID) 112 MCG tablet Take 112 mcg by mouth daily.   losartan (COZAAR) 25 MG tablet Take 1 tablet (25 mg total) by mouth daily.   Multiple Vitamin (MULTIVITAMIN WITH MINERALS) TABS tablet Take 1 tablet by mouth daily.   nitroGLYCERIN (NITROSTAT) 0.4 MG SL tablet Place 1 tablet (0.4 mg total) under the tongue every 5 (five) minutes as needed for chest pain.   potassium chloride (KLOR-CON) 10 MEQ tablet TAKE 1 TABLET BY MOUTH EVERY OTHER DAY (Patient taking differently: Take 10 mEq by mouth every other day.)   sertraline (ZOLOFT) 50 MG tablet Take 2.5 tablets (125 mg total) by mouth daily.   traZODone (DESYREL) 50 MG tablet Take 50 mg by mouth at  bedtime.     Allergies: Allergies as of 07/08/2022 - Review Complete 07/08/2022  Allergen Reaction Noted   Tetracyclines & related Anaphylaxis and Rash 01/31/2020   Sulfa antibiotics Hives 01/14/2012   Latex Rash 08/05/2015   Past Medical History:  Diagnosis Date   Acute colitis    Anemia    CAD (coronary artery disease)    a. heavy calcification of the entire LAD & mod eccentric mid LAD stenosis, estimated @ 70%, mild nonobs stenosis of RCA and LCx, severely calcified Ao valve with restricted valve mobility and 2+ AI     Cancer (HCC) 1981   breast   CHB (complete heart block) ([place]) 08/31/2015   [employer] Assurity DR  model [id number] (serial number  [id number] )    Chronic diastolic congestive heart failure (HCC)    a. echo 08/31/2015: EF 65-70%, nl WM, GR1DD, Ao valve stent bioprosthesis was present and functioning nl, no regurg, LA mildly dilated, PASP 46 mm Hg   Collagen vascular disease (HCC)    Colon polyps    Depression    Diabetes mellitus type II    Fibromyalgia    HTN (hypertension)    Hypercholesteremia    Hypothyroidism    IBS (irritable bowel syndrome)    Presence of permanent cardiac pacemaker    Rectal bleeding    Rheumatoid arthritis(714.0)    S/P TAVR (transcatheter aortic valve replacement) 08/30/2015   26 mm Edwards Sapien 3 transcatheter heart valve placed via open right transfemoral approach   Shortness of breath dyspnea    Stenosis of aortic valve    Suicide attempt ([place])    Thyroid disease     Family History:  Family History  Problem Relation Age of Onset   Depression Sister    Depression Sister    Depression Sister    Hypertension Mother    Lung cancer Sister    Stroke Sister    Hypertension Sister    Pneumonia Brother    Heart attack Neg Hx      Social History:  Lives with husband.  She has 5 children and 2 are in the area She is using a walker for ambulate.  She needs assistance with ADLs Denies alcohol, smoking or drug use Her PCP  is from [place] and last saw her a few months ago  Review of Systems: A complete ROS was negative except as per HPI.   Physical Exam: Blood pressure (!) 176/79, pulse 95, temperature 97.6 F (36.4 C), temperature source Tympanic, resp. rate 20, SpO2 98 %. Physical Exam Constitutional:      General: She is not in acute distress. HENT:     Head: Normocephalic.  Eyes:  General: No scleral icterus.       Right eye: No discharge.        Left eye: No discharge.     Conjunctiva/sclera: Conjunctivae normal.  Neck:     Comments: Tenderness to palpation in the right side.  She has good range of motion. Cardiovascular:     Rate and Rhythm: Normal rate and regular rhythm.     Heart sounds: Normal heart sounds.     Comments: No LE edema Pulmonary:     Effort: Pulmonary effort is normal. No respiratory distress.     Breath sounds: Normal breath sounds. No wheezing.  Abdominal:     General: Bowel sounds are normal.     Palpations: Abdomen is soft.     Tenderness: There is no guarding.     Comments: Mild tenderness to palpation the left lower quadrant  Musculoskeletal:        General: Normal range of motion.     Comments: Bilateral hips are stable  Skin:    General: Skin is warm.  Neurological:     Mental Status: She is alert.     Comments: PERRLA EOM intact No deficits of cranial nerves Mild dysarthria Normal finger to nose test. No pronator drift.  3/5 strength of bilateral upper extremities.   4/5 strength of bilateral lower extremities. Strength and sensation are symmetrical  Psychiatric:        Mood and Affect: Mood normal.      EKG: personally reviewed my interpretation is paced rhythm, rate 110   Assessment & Plan by Problem: Principal Problem:   Stroke-like symptoms Active Problems:   Type II diabetes mellitus (HCC)   Essential hypertension   Hypothyroidism   Abdominal pain   Depression   CAD (coronary artery disease)  Catherine [name] is a 81 year old  female with medical history of CAD (70% LAD), AS s/p TAVR, diabetes, CVA, HFpEF, hypothyroidism, RA, complete heart block with pacemaker placement 2016, who presents to the hospital after a fall and had facial droop with right-sided weakness and dysarthria.  She is admitted for work-up of possible CVA vs mechanical fall from physical deconditioning.  Fall Strokelike symptoms Physical Deconditioning Physical exam did not reveal any focal neurological deficit.  Patient has generalized decreased strength.  Differential includes CVA vs fall from deconditioning.   CT head was negative.  CTA did not show any emergent findings but she does have extensive atherosclerosis disease with high-grade PCA and moderate right M1 stenosis. Could not obtain MRI due to her pacemaker.  I called [employer] to inquire about her pacemaker model but they are not working on the weekend. Patient has many chronic conditions including RA, fibromyalgia, and poor appetite  that can contribute to her decline.  -Pending neurology recommendations -Obtain repeat A1c and lipid panel.   -Resume aspirin 81 mg and atorvastatin 40 mg -Allow permissive hypertension until rule out CVA -Echo in April negative for PFO so will not repeat. -PT/OT/SLP -Obtain orthostatic vitals -Pending UA -We will call [employer] again tomorrow about MRI compatibility.  Neck pain after the fall Obtain cervical x-ray  Complete heart block S/p pacemaker 2016 Pacemaker model: Assurity DR model GH8299 (serial number [id number]) -Last interrogation May 2023.  Unremarkable.  Hypertension CAD (70% LAD disease AND mild RCA) Currently chest pain-free.  Recently saw cardiology in May. -Holding Imdur, losartan at this time for permissive hypertension -Resume atorvastatin and aspirin  Chronic abdominal pain Melena/hematochezia Patient was seen in the ED multiple times for left lower quadrant  abdominal pain.  Most recent CT abdomen/pelvis in June was negative for acute  finding.  She has diverticulosis without diverticulitis.  She also has celiac trunk stenosis which can contribute to her abdominal pain.  Patient never had a colonoscopy and was referred to see GI in 09/27/2022. Her abdominal exam is benign on admission.  Her hemoglobin is normal.  No further inpatient work-up needed. -Follow-up with GI as scheduled  Hypothyroidism TSH low at 0.048 -Reduce levothyroxine to 100 mcg daily -Recheck TSH in 6 weeks  Anxiety and depression -Resume sertraline and trazodone -Hold off on Klonopin given her mental status. I cannot check PDMP  Full code Diet: Dysphagia 3 (she only has the top denture) IVF: N/A DVT: SCD for now.  Pending neuro recs  Dispo: Admit patient to Inpatient with expected length of stay greater than 2 midnights.  SignedGaylan Gerold, DO 07/08/2022, 12:51 PM  Pager: [phone] After 5pm on weekdays and 1pm on weekends: On Call pager: [phone]

## 2022-07-08 NOTE — ED Notes (Signed)
I called for a purple man from 5 w

## 2022-07-08 NOTE — ED Notes (Signed)
Transported to XR  

## 2022-07-08 NOTE — Consult Note (Signed)
Neurology Consultation  Reason for Consult: Code stroke Referring Physician: Dr. Tyrone Nine  CC: Head and neck pain  History is obtained from: EMS, patient, husband via telephone, chart review  HPI: Catherine [name] is a 81 y.o. female medical history significant for coronary artery disease, complete heart block with PPM, chronic diastolic CHF, essential hypertension, rheumatoid arthritis, and type 2 diabetes mellitus who presented to the ED on 8/6 via EMS for evaluation of right-sided weakness and slurred speech after a mechanical fall this morning.  Per patient's husband, patient got up around 3:00 in the morning to go to the restroom and was at her baseline mental and functional status at this time.  At around 8:00 in the morning patient was getting out of bed and grabbing for her walker when she sustained a mechanical fall from standing.  Patient states that she has hit her head on hard flooring and complains of head pain and neck pain.  After her fall, patient was noted to have right-sided weakness, right facial droop, and slurred speech and EMS was activated for further evaluation.  At baseline, patient is able to ambulate using a walker.  Patient's husband states that she is able to complete her ADLs independently though he is always with her to help her as needed.  LKW: 03:00 this morning TNK given?: no, patient is outside of thrombolytic therapy window at arrival IR Thrombectomy? No, imaging reviewed by attending neurologist and neuro interventional radiologist without evidence of LVO.  CT perfusion negative for core infarct. Modified Rankin Scale: 4-Needs assistance to walk and tend to bodily needs  ROS: A complete ROS was performed and is negative except as noted in the HPI.   Past Medical History:  Diagnosis Date   Acute colitis    Anemia    CAD (coronary artery disease)    a. heavy calcification of the entire LAD & mod eccentric mid LAD stenosis, estimated @ 70%, mild nonobs stenosis  of RCA and LCx, severely calcified Ao valve with restricted valve mobility and 2+ AI     Cancer (HCC) 1981   breast   CHB (complete heart block) ([place]) 08/31/2015   [employer] DR  model [id number] (serial number  [id number] )    Chronic diastolic congestive heart failure ([place])    a. echo 08/31/2015: EF 65-70%, nl WM, GR1DD, Ao valve stent bioprosthesis was present and functioning nl, no regurg, LA mildly dilated, PASP 46 mm Hg   Collagen vascular disease (HCC)    Colon polyps    Depression    Diabetes mellitus type II    Fibromyalgia    HTN (hypertension)    Hypercholesteremia    Hypothyroidism    IBS (irritable bowel syndrome)    Presence of permanent cardiac pacemaker    Rectal bleeding    Rheumatoid arthritis(714.0)    S/P TAVR (transcatheter aortic valve replacement) 08/30/2015   26 mm Edwards Sapien 3 transcatheter heart valve placed via open right transfemoral approach   Shortness of breath dyspnea    Stenosis of aortic valve    Suicide attempt ([place])    Thyroid disease    Past Surgical History:  Procedure Laterality Date   ABDOMINAL SURGERY     Intestinal surgery   CARDIAC SURGERY     CESAREAN SECTION     x 2   EP IMPLANTABLE DEVICE N/A 08/31/2015   Procedure: Pacemaker Implant;  Surgeon: Will Meredith Leeds, MD; [place] DR  model [id number] (serial number  [id number] ); Laterality: Right   ESOPHAGOGASTRODUODENOSCOPY (EGD) WITH PROPOFOL N/A 04/04/2021   Procedure: ESOPHAGOGASTRODUODENOSCOPY (EGD) WITH PROPOFOL;  Surgeon: Jonathon Bellows, MD;  Location: [facility] ENDOSCOPY;  Service: Gastroenterology;  Laterality: N/A;   LAPAROSCOPY N/A 01/31/2020   Procedure: LAPAROSCOPY DIAGNOSTIC;  Surgeon: Kieth Brightly Arta Bruce, MD;  Location: [place];  Service: General;  Laterality: N/A;   LAPAROTOMY N/A 01/31/2020   Procedure: Exploratory Laparotomy;  Surgeon: Kieth Brightly Arta Bruce, MD;  Location: [place];  Service: General;  Laterality: N/A;   LEFT OOPHORECTOMY     LYSIS OF ADHESION   01/31/2020   Procedure: Lysis Of Adhesions, Ligation of Falciform Vein;  Surgeon: Mickeal Skinner, MD;  Location: [place];  Service: General;;   MASTECTOMY Left    polyp      self inflicted chest wound     [place]   lower back   TEE WITHOUT CARDIOVERSION N/A 08/30/2015   Procedure: TRANSESOPHAGEAL ECHOCARDIOGRAM (TEE);  Surgeon: Sherren Mocha, MD;  Location: [place];  Service: Open Heart Surgery;  Laterality: N/A;   TOTAL ABDOMINAL HYSTERECTOMY     TRANSCATHETER AORTIC VALVE REPLACEMENT, TRANSFEMORAL N/A 08/30/2015   Procedure: TRANSCATHETER AORTIC VALVE REPLACEMENT, TRANSFEMORAL;  Surgeon: Sherren Mocha, MD;  Location: [place];  Service: Open Heart Surgery;  Laterality: N/A;   Family History  Problem Relation Age of Onset   Depression Sister    Depression Sister    Depression Sister    Hypertension Mother    Lung cancer Sister    Stroke Sister    Hypertension Sister    Pneumonia Brother    Heart attack Neg Hx    Social History:   reports that she has never smoked. She has never used smokeless tobacco. She reports that she does not drink alcohol and does not use drugs.  Medications No current facility-administered medications for this encounter.  Current Outpatient Medications:    acetaminophen (TYLENOL) 325 MG tablet, Take 2 tablets (650 mg total) by mouth every 4 (four) hours as needed., Disp: 20 tablet, Rfl: 0   acetaminophen (TYLENOL) 500 MG tablet, Take 1,000 mg by mouth every 6 (six) hours as needed for mild pain., Disp: , Rfl:    aspirin EC 81 MG tablet, Take 1 tablet (81 mg total) by mouth daily. Swallow whole., Disp: 90 tablet, Rfl: 3   atorvastatin (LIPITOR) 40 MG tablet, Take 1 tablet by mouth daily., Disp: , Rfl:    benztropine (COGENTIN) 0.5 MG tablet, Take 0.5 mg by mouth daily., Disp: , Rfl:    budesonide (PULMICORT) 0.5 MG/2ML nebulizer solution, Take 2 mLs by nebulization 2 (two) times daily., Disp: , Rfl:    cephALEXin (KEFLEX) 500 MG capsule, Take 1  capsule (500 mg total) by mouth 2 (two) times daily., Disp: 14 capsule, Rfl: 0   clonazePAM (KLONOPIN) 0.5 MG tablet, Take 1 tablet (0.5 mg total) by mouth at bedtime., Disp: 30 tablet, Rfl: 2   isosorbide mononitrate (IMDUR) 30 MG 24 hr tablet, TAKE 1/2 OF A TABLET (15 MG TOTAL) BY MOUTH DAILY, Disp: 45 tablet, Rfl: 1   levothyroxine (SYNTHROID) 112 MCG tablet, Take 112 mcg by mouth daily., Disp: , Rfl:    losartan (COZAAR) 25 MG tablet, Take 1 tablet (25 mg total) by mouth daily., Disp: 30 tablet, Rfl: 1   magnesium oxide (MAG-OX) 400 MG tablet, Take by mouth., Disp: , Rfl:    Multiple Vitamin (MULTIVITAMIN WITH MINERALS) TABS tablet, Take 1 tablet by mouth daily., Disp: , Rfl:  nitroGLYCERIN (NITROSTAT) 0.4 MG SL tablet, Place 1 tablet (0.4 mg total) under the tongue every 5 (five) minutes as needed for chest pain., Disp: 25 tablet, Rfl: 3   ondansetron (ZOFRAN) 4 MG tablet, Take 1 tablet (4 mg total) by mouth every 8 (eight) hours as needed for up to 5 doses for nausea or vomiting., Disp: 5 tablet, Rfl: 0   ONETOUCH VERIO test strip, , Disp: , Rfl:    oxybutynin (DITROPAN-XL) 10 MG 24 hr tablet, Take 10 mg by mouth daily., Disp: , Rfl:    potassium chloride (KLOR-CON) 10 MEQ tablet, TAKE 1 TABLET BY MOUTH EVERY OTHER DAY, Disp: 45 tablet, Rfl: 0   sertraline (ZOLOFT) 50 MG tablet, Take 2.5 tablets (125 mg total) by mouth daily., Disp: 225 tablet, Rfl: 0   traZODone (DESYREL) 50 MG tablet, Take trazodone 50 mg as needed, take 1-2 tabs at night, Disp: , Rfl:   Exam: Current vital signs: BP (!) 182/90   Pulse (!) 112   Temp 97.6 F (36.4 C) (Tympanic)   Resp 20   SpO2 98%  Vital signs in last 24 hours: Temp:  [97.6 F (36.4 C)] 97.6 F (36.4 C) (08/06 0918) Pulse Rate:  [111-112] 112 (08/06 0928) Resp:  [20] 20 (08/06 0928) BP: (164-182)/(89-90) 182/90 (08/06 0928) SpO2:  [98 %-100 %] 98 % (08/06 0928)  GENERAL: Awake, alert, in no acute distress Psych: Patient is calm and  cooperative with examination Head: Normocephalic and atraumatic, without obvious abnormality EENT: Cervical collar in place on arrival, no OP obstruction LUNGS: Normal respiratory effort. Non-labored breathing on room air CV: Paced rhythm on telemetry, tachycardic ABDOMEN: Soft, non-tender, non-distended Extremities: Warm, well perfused, right greater > left knee ecchymosis and edema noted from recent fall  NEURO:  Mental Status: Awake, alert, and oriented to self.  She incorrectly states that she is 81 years old and does not initially answer the correct month. Speech/Language: speech is dysarthric. Naming partially intact with mild aphasia.  Patient names 3/5 objects though does state that she has trouble seeing some of them.  She does not have her glasses on arrival. Cranial Nerves:  II: PERRL 3 mm/brisk. Visual fields full.  III, IV, VI: EOMI without ptosis, gaze preference, nystagmus V: Sensation is intact to light touch and symmetrical to face. Blinks to threat.  VII: Right facial droop is noted VIII: Hearing IS intact to voice IX, X: Palate elevation is symmetric. Phonation normal.  XI: Patient in cervical collar, head turn not assessed XII: Tongue protrudes midline though with minimal protrusion Motor: Patient's right upper extremity drifts to bed on assessment, left upper extremity has minimal drift.  Patient's left lower extremity drifts to bed, right lower extremity with minimal vertical drift. Tone is normal. Bulk is normal.  Sensation: Intact to light touch bilaterally in all four extremities. No extinction to DSS present.  Coordination: Slight ataxia noted in the right upper extremity. Gait: Deferred for patient safety  NIHSS: 1a Level of Conscious.: 0 1b LOC Questions: 2 1c LOC Commands: 0 2 Best Gaze: 0 3 Visual: 0 4 Facial Palsy: 1 5a Motor Arm - left: 1 5b Motor Arm - Right: 2 6a Motor Leg - Left: 2 6b Motor Leg - Right: 1 7 Limb Ataxia: 1 8 Sensory: 0 9 Best  Language: 1 10 Dysarthria: 1 11 Extinct. and Inatten.: 0 TOTAL: 12  Labs I have reviewed labs in epic and the results pertinent to this consultation are: CBC    Component  Value Date/Time   WBC 10.2 07/08/2022 0920   RBC 4.65 07/08/2022 0920   HGB 13.9 07/08/2022 0922   HGB 12.4 02/26/2015 1136   HCT 41.0 07/08/2022 0922   HCT 38.5 02/26/2015 1136   PLT 270 07/08/2022 0920   PLT 237 02/26/2015 1136   MCV 89.5 07/08/2022 0920   MCV 84 02/26/2015 1136   MCH 28.4 07/08/2022 0920   MCHC 31.7 07/08/2022 0920   RDW 13.2 07/08/2022 0920   RDW 14.6 (H) 02/26/2015 1136   LYMPHSABS 1.6 07/08/2022 0920   LYMPHSABS 1.8 02/26/2015 1136   MONOABS 0.8 07/08/2022 0920   MONOABS 0.8 02/26/2015 1136   EOSABS 0.1 07/08/2022 0920   EOSABS 0.0 02/26/2015 1136   BASOSABS 0.0 07/08/2022 0920   BASOSABS 0.1 02/26/2015 1136   CMP     Component Value Date/Time   NA 137 07/08/2022 0922   NA 135 11/20/2018 1513   NA 135 02/26/2015 1136   K 4.1 07/08/2022 0922   K 4.0 02/26/2015 1136   CL 102 07/08/2022 0922   CL 99 (L) 02/26/2015 1136   CO2 26 06/11/2022 1235   CO2 28 02/26/2015 1136   GLUCOSE 120 (H) 07/08/2022 0922   GLUCOSE 119 (H) 02/26/2015 1136   BUN 17 07/08/2022 0922   BUN 24 11/20/2018 1513   BUN 16 02/26/2015 1136   CREATININE 0.60 07/08/2022 0922   CREATININE 0.78 09/14/2015 1125   CALCIUM 9.4 06/11/2022 1235   CALCIUM 9.3 02/26/2015 1136   PROT 7.2 06/11/2022 1235   PROT 7.7 06/27/2014 0531   ALBUMIN 4.0 06/11/2022 1235   ALBUMIN 3.7 06/27/2014 0531   AST 19 06/11/2022 1235   AST 19 06/27/2014 0531   ALT 9 06/11/2022 1235   ALT 20 06/27/2014 0531   ALKPHOS 50 06/11/2022 1235   ALKPHOS 74 06/27/2014 0531   BILITOT 0.8 06/11/2022 1235   BILITOT 0.8 06/27/2014 0531   GFRNONAA >60 06/11/2022 1235   GFRNONAA >60 02/26/2015 1136   GFRAA >60 02/14/2020 0711   GFRAA >60 02/26/2015 1136   Lipid Panel     Component Value Date/Time   CHOL 186 03/10/2017 0424   TRIG 147  03/10/2017 0424   HDL 43 03/10/2017 0424   CHOLHDL 4.3 03/10/2017 0424   VLDL 29 03/10/2017 0424   LDLCALC 114 (H) 03/10/2017 0424   Lab Results  Component Value Date   HGBA1C 5.8 (H) 03/07/2022   Imaging I have reviewed the images obtained:  CT-scan of the brain 8/6: 1. Negative for hemorrhage or visible infarct. 2. Left scalp swelling.  CTA/CTP Code Stroke 8/6: 1. No emergent finding by CTA and CTP. 2. Atherosclerosis in the neck causes proximal ICA stenosis of 65% on the right and 40% on the left. 3. Extensive intracranial atherosclerosis most notably causing bilateral high-grade PCA stenosis and moderate right M1 stenosis. 4. Great vessel origins are not covered.  Assessment: 81 y.o. with PMHx significant for HTN, CAD, DM2, chronic diastolic CHF, RA, hypothyroidism, complete heart block s/p PPM who presented to the ED on 8/6 after sustaining a mechanical fall from standing at home with right-sided weakness, right facial droop, and slurred speech.  Patient complains of neck and head pain on arrival. -Examination reveals patient with right facial droop, mixed weakness of upper and lower extremities, dysarthria, mild aphasia, and ataxia of the right upper extremity.  Patient does have ecchymosis and edema of the bilateral knees s/p mechanical fall.  NIHSS of 12 on arrival. -Imaging reveals left scalp  swelling but no emergent finding with CTA and CTP.  Patient does have extensive intracranial atherosclerosis with bilateral high-grade PCA stenosis and moderate right M1 stenosis. -Patient is not a candidate for TNK due to mechanical fall, presenting outside of the thrombolytic therapy time window.  Not a candidate for IR as no LVO was identified on vessel imaging. CT perfusion was neg. -Due to patient's presentation, will obtain MRI brain for further stroke evaluation. -Stroke risk factors include CAD, DM 2, intracranial atherosclerosis, advanced age. - C spine showed extensive DDD, no  acute fractures on plain film and CT  Recommendations:  - Admit for stroke workup - Permissive HTN x48 hrs from sx onset or until stroke ruled out by MRI goal BP <220/110. PRN labetalol or hydralazine if BP above these parameters. Avoid oral antihypertensives. - MRI brain wo contrast - TTE - Check A1c and LDL + add statin per guidelines - ASA '81mg'$  daily + plavix '75mg'$  daily x90 days f/b ASA '81mg'$  daily monotherapy after that (2/2 severe intracranial stenosis) - q4 hr neuro checks - STAT head CT for any change in neuro exam - Tele - PT/OT/SLP - Stroke education - Amb referral to neurology upon discharge   Pt seen by NP/Neuro and later by MD. Note/plan to be edited by MD as needed.  Anibal Henderson, AGAC-NP [facility] Pager: [phone]  Neurology Attending Attestation   I examined the patient and discussed plan with Ms. Toberman NP. Above note has been edited by me to reflect my findings and recommendations. I was present throughout the stroke code and made all significant decisions and personally reviewed CNS imaging. Stroke team will continue to follow.   Su Monks, MD [facility] [phone]   If 7pm- 7am, please page neurology on call as listed in [place].

## 2022-07-09 ENCOUNTER — Inpatient Hospital Stay (HOSPITAL_COMMUNITY): Payer: Medicare Other

## 2022-07-09 ENCOUNTER — Encounter (HOSPITAL_COMMUNITY): Payer: Self-pay | Admitting: Internal Medicine

## 2022-07-09 DIAGNOSIS — G459 Transient cerebral ischemic attack, unspecified: Secondary | ICD-10-CM | POA: Diagnosis not present

## 2022-07-09 DIAGNOSIS — Z950 Presence of cardiac pacemaker: Secondary | ICD-10-CM

## 2022-07-09 DIAGNOSIS — E781 Pure hyperglyceridemia: Secondary | ICD-10-CM | POA: Diagnosis not present

## 2022-07-09 DIAGNOSIS — I679 Cerebrovascular disease, unspecified: Secondary | ICD-10-CM

## 2022-07-09 DIAGNOSIS — I2510 Atherosclerotic heart disease of native coronary artery without angina pectoris: Secondary | ICD-10-CM

## 2022-07-09 DIAGNOSIS — E039 Hypothyroidism, unspecified: Secondary | ICD-10-CM

## 2022-07-09 DIAGNOSIS — I442 Atrioventricular block, complete: Secondary | ICD-10-CM

## 2022-07-09 DIAGNOSIS — W0110XA Fall on same level from slipping, tripping and stumbling with subsequent striking against unspecified object, initial encounter: Secondary | ICD-10-CM

## 2022-07-09 DIAGNOSIS — I639 Cerebral infarction, unspecified: Secondary | ICD-10-CM

## 2022-07-09 DIAGNOSIS — M542 Cervicalgia: Secondary | ICD-10-CM

## 2022-07-09 DIAGNOSIS — I10 Essential (primary) hypertension: Secondary | ICD-10-CM | POA: Diagnosis not present

## 2022-07-09 DIAGNOSIS — R2990 Unspecified symptoms and signs involving the nervous system: Secondary | ICD-10-CM | POA: Diagnosis not present

## 2022-07-09 LAB — BASIC METABOLIC PANEL
Anion gap: 13 (ref 5–15)
BUN: 11 mg/dL (ref 8–23)
CO2: 21 mmol/L — ABNORMAL LOW (ref 22–32)
Calcium: 9.5 mg/dL (ref 8.9–10.3)
Chloride: 103 mmol/L (ref 98–111)
Creatinine, Ser: 0.77 mg/dL (ref 0.44–1.00)
GFR, Estimated: >60 mL/min (ref >60)
Glucose, Bld: 111 mg/dL — ABNORMAL HIGH (ref 70–99)
Potassium: 3.3 mmol/L — ABNORMAL LOW (ref 3.5–5.1)
Sodium: 137 mmol/L (ref 135–145)

## 2022-07-09 LAB — CBC
HCT: 40.8 % (ref 36.0–46.0)
Hemoglobin: 13.3 g/dL (ref 12.0–15.0)
MCH: 28.5 pg (ref 26.0–34.0)
MCHC: 32.6 g/dL (ref 30.0–36.0)
MCV: 87.4 fL (ref 80.0–100.0)
Platelets: 254 10*3/uL (ref 150–400)
RBC: 4.67 MIL/uL (ref 3.87–5.11)
RDW: 13.2 % (ref 11.5–15.5)
WBC: 10.4 10*3/uL (ref 4.0–10.5)
nRBC: 0 % (ref 0.0–0.2)

## 2022-07-09 LAB — GLUCOSE, CAPILLARY: Glucose-Capillary: 104 mg/dL — ABNORMAL HIGH (ref 70–99)

## 2022-07-09 MED ORDER — ATORVASTATIN CALCIUM 80 MG PO TABS
80.0000 mg | ORAL_TABLET | Freq: Every day | ORAL | Status: DC
  Administered 2022-07-10 – 2022-07-16 (×7): 80 mg via ORAL
  Filled 2022-07-09 (×8): qty 1

## 2022-07-09 MED ORDER — ATORVASTATIN CALCIUM 40 MG PO TABS
40.0000 mg | ORAL_TABLET | Freq: Once | ORAL | Status: AC
  Administered 2022-07-09: 40 mg via ORAL
  Filled 2022-07-09: qty 1

## 2022-07-09 MED ORDER — POTASSIUM CHLORIDE CRYS ER 20 MEQ PO TBCR
40.0000 meq | EXTENDED_RELEASE_TABLET | ORAL | Status: AC
  Administered 2022-07-09 (×2): 40 meq via ORAL
  Filled 2022-07-09 (×2): qty 2

## 2022-07-09 NOTE — Progress Notes (Signed)
OT Cancellation Note  Patient Details Name: Catherine [name] MRN: [id number] DOB: 03-14-1941   Cancelled Treatment:    Reason Eval/Treat Not Completed: Patient at procedure or test/ unavailable Patient currently off floor at vascular, OT will follow back as time permits in order to complete OT evaluation.   Corinne Ports E. Tramya Schoenfelder, OTR/L Acute Rehabilitation Services [phone]   Ascencion Dike 07/09/2022, 9:44 AM

## 2022-07-09 NOTE — Progress Notes (Signed)
  Transition of Care [facility]) Screening Note   Patient Details  Name: Catherine [name] Date of Birth: Apr 24, 1941   Transition of Care [facility]) CM/SW Contact:    Cyndi Bender, RN Phone Number: 07/09/2022, 8:10 AM    Transition of Care Department [facility]) has reviewed patient and no TOC needs have been identified at this time. We will continue to monitor patient advancement through interdisciplinary progression rounds. If new patient transition needs arise, please place a TOC consult.

## 2022-07-09 NOTE — Hospital Course (Addendum)
Per neuro note, NEURO:  Mental Status: Awake, alert, and oriented to self.  She incorrectly states that she is 81 years old and does not initially answer the correct month. Speech/Language: speech is dysarthric. Naming partially intact with mild aphasia.  Patient names 3/5 objects though does state that she has trouble seeing some of them.  She does not have her glasses on arrival. Cranial Nerves:  II: PERRL 3 mm/brisk. Visual fields full.  III, IV, VI: EOMI without ptosis, gaze preference, nystagmus V: Sensation is intact to light touch and symmetrical to face. Blinks to threat.  VII: Right facial droop is noted VIII: Hearing IS intact to voice IX, X: Palate elevation is symmetric. Phonation normal.  XI: Patient in cervical collar, head turn not assessed XII: Tongue protrudes midline though with minimal protrusion Motor: Patient's right upper extremity drifts to bed on assessment, left upper extremity has minimal drift.  Patient's left lower extremity drifts to bed, right lower extremity with minimal vertical drift. Tone is normal. Bulk is normal.  Sensation: Intact to light touch bilaterally in all four extremities. No extinction to DSS present.  Coordination: Slight ataxia noted in the right upper extremity. Gait: Deferred for patient safety  Benzodiazepine withdrawal Resting tremor Muscle Rigidity History of Fall Strokelike symptoms Physical deconditioning Patient presented after fall and right sided weakness. She was noted to have dysarthria. CVA workup at hospital stay and neurology consulted. CT head was negative. Unable to obtain MRI due to incompatibility with pacemaker. Patient reports poor appetite and extensive chronic conditions leading to health decline.  A1c was 5.4%. LDL 112. No focal neuro deficits. Neurology consulted and thinks it is more encephalopathy than CVA. CK normal. Ammonia normal. SLP recommended dysphagia 1 diet. PT and OT recommended SNF. TOC working on Psychologist, clinical for [employer]   She became confused and disoriented briefly over her hospital stay. Developed resting tremor and muscle rigidity in her UE. We restarted her Klonopin 0.5 mg and noted drastic improvement. Alert and oriented x 3 following Klonopin administration. The following day, she was oriented x 3 and no longer had obvious tremors or rigidity. Concern that her mental status change and physical signs were from benzodiazepine withdrawal. Per family, she doubled her dose a week prior to hospital admission. Ran out 3 days before arriving to the ED. Continued her Klonopin nightly.   Neurology recommended starting on aspirin 81 mg and plavix 75 mg for total 3 weeks then transition to plavix alone. Atorvastatin increased to 80 mg. Will need neurology follow-up??***.    UTI Cystocerebral Syndrome Patient was retaining urine. Urine culture grew Klebsiella pneumoniae 20,000 colonies. Given her confusion early in her hospital course, she was treated with rocephin. Her urinary retention could have contributed her delirious state. More likely the mental status changes are from benzodiazepine withdrawal.    Neck pain after fall X-ray of cervical spine showed multilevel degenerative changes but no acute fractures. Neck pain is left-sided but improved with normal ROM.     Tachycardia Complete heart block s/p pacemaker 2016 Pacemaker model: [employer] DR model [id number] (serial number [id number]) Last interrogation in May 2023. Unable to obtain MRI due to incompatibility. Patient was tachycardic so started on metoprolol 12.5 mg twice daily. HR improved and <100.    HTN CAD (70% LAD disease and mild RCA) Saw cardiology in May. Continued her home aspirin and atorvastatin. Restarted losartan at 12.5 mg. BP is ***.    Hypothyroidism TSH low 0.048. Reduced levothyroxine to 100  mcg daily. Will need outpatient f/u to recheck TSH in 6 weeks.   Anxiety, Depression Patient has extensive history of  anxiety, depression and suicide attempts. She is on Klonopin, Trazodone and Zoloft. Psychiatry note in May noted cogentin which she is not taking since February. Her mental status change and rigidity is likely from klonopin withdrawal. Continued her home sertraline and trazodone.    Skin Tear, intergluteal cleft Patient reports pain on her buttocks. Exam showed skin tear at intergluteal cleft. Consulted wound care for management. Triple paste on affected area.

## 2022-07-09 NOTE — Progress Notes (Signed)
HD#1 Subjective:   Summary: Catherine [name] is a 81 year old female with PMH of CAD, AS s/p TAVR, diabetes, CVA, HFpEF, hypothyroidism, RA, complete heart block with pacemaker in 2016 who presents after fall and noted facial droop, right-sided weakness and dysarthria, admitted for CVA work-up versus mechanical fall due to physical deconditioning.  Overnight Events: None  She was alert and oriented X3 to person, place, time. Has some posterior neck pain after her fall. Has trouble finding her words.    Objective:  Vital signs in last 24 hours: Vitals:   07/08/22 2342 07/09/22 0430 07/09/22 0755 07/09/22 0814  BP: (!) 171/78 (!) 128/100 (!) 163/67 (!) 173/72  Pulse: 81 (!) 104 100 (!) 104  Resp: '20 18 16 15  '$ Temp: 98.2 F (36.8 C) 98 F (36.7 C) 97.8 F (36.6 C) 97.6 F (36.4 C)  TempSrc: Oral Oral Oral Oral  SpO2: 97% 97% 98% 98%   Supplemental O2: Room Air SpO2: 98 %   Physical Exam:  Constitutional: alert, in no acute distress HENT: normocephalic atraumatic Eyes: EOM intact Neck: supple Cardiovascular: tachycardia, has pacemaker  MSK: normal bulk and tone Neurological: alert & oriented x 3 to person, place and time  CN2-12 intact  Finger-to-nose intact  UE sensation intact   4/5 strength UE bilaterally  Pronator drift negative  LE sensation intact bilaterally  5/5 strength LE bilaterally  Some difficulty with heel-to-shin Skin: warm and dry Psych: normal mood and behavior  There were no vitals filed for this visit.  No intake or output data in the 24 hours ending 07/09/22 1451 Net IO Since Admission: No IO data has been entered for this period [07/09/22 1451]  Pertinent Labs:    Latest Ref Rng & Units 07/09/2022    8:26 AM 07/08/2022    9:22 AM 07/08/2022    9:20 AM  CBC  WBC 4.0 - 10.5 K/uL 10.4   10.2   Hemoglobin 12.0 - 15.0 g/dL 13.3  13.9  13.2   Hematocrit 36.0 - 46.0 % 40.8  41.0  41.6   Platelets 150 - 400 K/uL 254   270        Latest Ref Rng &  Units 07/09/2022    8:26 AM 07/08/2022    9:22 AM 07/08/2022    9:20 AM  CMP  Glucose 70 - 99 mg/dL 111  120  119   BUN 8 - 23 mg/dL '11  17  15   '$ Creatinine 0.44 - 1.00 mg/dL 0.77  0.60  0.78   Sodium 135 - 145 mmol/L 137  137  138   Potassium 3.5 - 5.1 mmol/L 3.3  4.1  3.9   Chloride 98 - 111 mmol/L 103  102  101   CO2 22 - 32 mmol/L 21   22   Calcium 8.9 - 10.3 mg/dL 9.5   9.9   Total Protein 6.5 - 8.1 g/dL   7.2   Total Bilirubin 0.3 - 1.2 mg/dL   0.8   Alkaline Phos 38 - 126 U/L   55   AST 15 - 41 U/L   18   ALT 0 - 44 U/L   9     Imaging: VAS [place] LOWER EXTREMITY VENOUS (DVT)  Result Date: 07/09/2022  Lower Venous DVT Study Patient Name:  Catherine [name]  Date of Exam:   07/09/2022 Medical Rec #: [id number]       Accession #:    [id number] Date of Birth: 04-May-1941  Patient Gender: F Patient Age:   14 years Exam Location:  [facility] Procedure:      VAS [place] LOWER EXTREMITY VENOUS (DVT) Referring Phys: Cornelius Moras XU --------------------------------------------------------------------------------  Indications: Stroke.  Comparison Study: no prior Performing Technologist: Archie Patten RVS  Examination Guidelines: A complete evaluation includes B-mode imaging, spectral Doppler, color Doppler, and power Doppler as needed of all accessible portions of each vessel. Bilateral testing is considered an integral part of a complete examination. Limited examinations for reoccurring indications may be performed as noted. The reflux portion of the exam is performed with the patient in reverse Trendelenburg.  +---------+---------------+---------+-----------+----------+--------------+ RIGHT    CompressibilityPhasicitySpontaneityPropertiesThrombus Aging +---------+---------------+---------+-----------+----------+--------------+ CFV      Full           Yes      Yes                                 +---------+---------------+---------+-----------+----------+--------------+ SFJ      Full                                                         +---------+---------------+---------+-----------+----------+--------------+ FV Prox  Full                                                        +---------+---------------+---------+-----------+----------+--------------+ FV Mid   Full                                                        +---------+---------------+---------+-----------+----------+--------------+ FV DistalFull                                                        +---------+---------------+---------+-----------+----------+--------------+ PFV      Full                                                        +---------+---------------+---------+-----------+----------+--------------+ POP      Full           Yes      Yes                                 +---------+---------------+---------+-----------+----------+--------------+ PTV      Full                                                        +---------+---------------+---------+-----------+----------+--------------+ PERO  Full                                                        +---------+---------------+---------+-----------+----------+--------------+   +---------+---------------+---------+-----------+----------+--------------+ LEFT     CompressibilityPhasicitySpontaneityPropertiesThrombus Aging +---------+---------------+---------+-----------+----------+--------------+ CFV      Full           Yes      Yes                                 +---------+---------------+---------+-----------+----------+--------------+ SFJ      Full                                                        +---------+---------------+---------+-----------+----------+--------------+ FV Prox  Full                                                        +---------+---------------+---------+-----------+----------+--------------+ FV Mid   Full                                                         +---------+---------------+---------+-----------+----------+--------------+ FV DistalFull                                                        +---------+---------------+---------+-----------+----------+--------------+ PFV      Full                                                        +---------+---------------+---------+-----------+----------+--------------+ POP      Full           Yes      Yes                                 +---------+---------------+---------+-----------+----------+--------------+ PTV      Full                                                        +---------+---------------+---------+-----------+----------+--------------+ PERO     Full                                                        +---------+---------------+---------+-----------+----------+--------------+  Summary: BILATERAL: - No evidence of deep vein thrombosis seen in the lower extremities, bilaterally. -No evidence of popliteal cyst, bilaterally.   *See table(s) above for measurements and observations. Electronically signed by Servando Snare MD on 07/09/2022 at 1:35:33 PM.    Final    CT HEAD WO CONTRAST (5MM)  Result Date: 07/09/2022 CLINICAL DATA:  Provided history: Stroke, follow-up. EXAM: CT HEAD WITHOUT CONTRAST TECHNIQUE: Contiguous axial images were obtained from the base of the skull through the vertex without intravenous contrast. RADIATION DOSE REDUCTION: This exam was performed according to the departmental dose-optimization program which includes automated exposure control, adjustment of the mA and/or kV according to patient size and/or use of iterative reconstruction technique. COMPARISON:  Non-contrast head CT and CT angiogram head/neck 07/08/2022. Brain MRI 08/15/2015. FINDINGS: Brain: Moderate generalized cerebral atrophy. Mild patchy and ill-defined hypoattenuation within the cerebral white matter, nonspecific but compatible with chronic small vessel ischemic disease.  There is no acute intracranial hemorrhage. No demarcated cortical infarct. No extra-axial fluid collection. No evidence of an intracranial mass. No midline shift. Vascular: No hyperdense vessel. Atherosclerotic calcifications. Skull: No fracture or aggressive osseous lesion. Sinuses/Orbits: No mass or acute finding within the imaged orbits. Opacification of a posterior right ethmoid air cell. IMPRESSION: No evidence of acute intracranial abnormality. Mild chronic small vessel ischemic changes within the cerebral white matter. Moderate generalized cerebral atrophy. Right ethmoid sinusitis. Electronically Signed   By: Kellie Simmering D.O.   On: 07/09/2022 10:20    Assessment/Plan:   Principal Problem:   Stroke-like symptoms Active Problems:   Type II diabetes mellitus ([place])   Essential hypertension   Hypothyroidism   Abdominal pain   Depression   CAD (coronary artery disease)   Patient Summary: Catherine [name] is a 81 y.o. with a pertinent PMH of CAD, AS s/p TAVR, diabetes, CVA, HFpEF, hypothyroidism, RA, complete heart block s/p pacemaker in 2016, who presented after fall and noted facial droop, right-sided weakness and dysarthria and admitted for CVA workup vs mechanical fall due to physical deconditioning.    Fall Strokelike symptoms Physical deconditioning No focal neurological deficits. She is having trouble finding her words. CT head was negative.  Unable to obtain MRI due to incompatibility with pacemaker. Patient reports poor appetite and extensive chronic conditions leading to health decline.  A1c was 5.4%. Lipid panel significant for LDL 112. Continue permissive hypertension until 0800 07/10/22. If BP >220/110, PRN labetalol or hydralazine. Will need neurology referral at discharge.  -continue aspirin and plavix -continue atorvastatin 40 mg -Permissive hypertension funtil 8/8 0800, then can restart HTN meds -PT/OT/SLP -q4h neuro checks -telemetry  Neck pain after fall Still having  some neck pain after fall. X-ray of cervical spine showed multilevel degenerative changes.   Complete heart block s/p pacemaker 2016 Pacemaker model: Assurity DR model ZO1096 (serial number [id number]) Last interrogation in May 2023. Unable to obtain MRI due to incompatibility.   HTN CAD (70% LAD disease and mild RCA) Saw cardiology in May. Hold Imdur and losartan for permissive hypertension -continue aspirin and atorvastatin  Hypothyroidism TSH low 0.048. Reduced levothyroxine to 100 mcg daily. Will need outpatient f/u to recheck TSH in 6 weeks.   Anxiety, Depression -continue home sertraline and trazodone   Diet:  Dysphagia 3 IVF: None,None VTE:  SCD Code: Full PT/OT recs: None, none. TOC recs: none   Dispo: Anticipated discharge to Home pending CVA workup.   Angelique Blonder, DO Internal Medicine Resident PGY-1 Please contact the on call pager after 5  pm and on weekends at [phone].

## 2022-07-09 NOTE — Progress Notes (Signed)
   Inpatient Rehab Admissions Coordinator :  Per therapy recommendations patient was screened for CIR candidacy by Danne Baxter RN MSN. Patient does not appear to demonstrate the medical neccesity for a [place] /CIR admit. Noted Neurology felt likely consistent with TIA. I will not place a Rehab Consult. Recommend other Rehab Venues to be pursued. Please contact me with any questions.  Danne Baxter RN MSN Admissions Coordinator [phone]

## 2022-07-09 NOTE — Consult Note (Addendum)
Cardiology Consultation:   Patient ID: Catherine [name] MRN: [id number]; DOB: [birth year]  Admit date: 07/08/2022 Date of Consult: 07/09/2022  PCP:  Danelle Berry, NP   [facility] Providers Cardiologist:  Ida Rogue, MD  Electrophysiologist:  Vickie Epley, MD       Patient Profile:   Catherine [name] is a 81 y.o. female with a hx of CAD, CAD s/p TAVR, Heart Block with PPM,HLD, TIA, CVA chronic HFpEF, DM, hypothyroid who is being seen 07/09/2022 for the evaluation of tachycardia on admit with Rt sided weakness possible CVA at the request of Dr. Daryll Drown.  History of Present Illness:   Ms. Neuwirth with hx of AS and underwent TAVR in 0626 complicated by CHB and PPM placed.  Then with hemothorax requiring chest tube.  She had cardiac cath 2016 with 70% mLAD stenosis, mild nonobstructive stenosis of RCA and LCX, and severe AS.  She then underwent TAVR successfully.     Her last echo was 03/08/22 with EF 55%, The left ventricle demonstrates regional  wall motion abnormalities (septal wall motion abnormality likely secondary to conduction abnormality).  The aortic valve has been repaired/replaced. Aortic valve  regurgitation is not visualized. No aortic stenosis is present. Aortic valve area, by VTI measures 2.39 cm. Aortic valve mean gradient measures 4.0 mmHg.  Last pacer check 04/12/22 with nine atrial arrhythmia longest 2 min.   Lead parameters stable.   Seen in ER 03/2022 for chest pain and neg trops. She was A sensed and V pacing.  She was placed on Imdur 15 mg. Wall motion abnormalities on echo noted but not due to ischemia but device.   On med lists some have metoprolol 25 BID and some do not.  Pt could not tell me her meds.   Presented to ER yesterday with weakness and ataxia, fell to floor and hit her head.  + rt facial droop, Rt sided weakness some confusion.  She has multiple issues with cystitis, hemorrhoids, chronic abd pain intermittent melena.  No chest pain or SOB.  CT head  neg for hemorrhage or infarct.  CTA neg for emergent finding.  Neuro has seen plan for MRI of brain.   Cardiology called for eval for tachycardia with HR to 122, appears ST with V pacing.   EKG:  The EKG was personally reviewed and demonstrates:  A sensed with V pacing   follow up with a sensed V pacing. Telemetry:  Telemetry was personally reviewed and demonstrates:  appears A sensed V paced ST.   Labs today  Na 137 K+ 3.3 BUN 11, Cr 0.77  WBC 10.4 Hgb 13.3 plts 254  LDL 112 HDL 47 TG 105 A1c 5.4  TSH 0.048  Venous doppler neg for bil DVT BP 133/110 P 120 to 110 afebrile  No chest pain or SOB.  Poor memory   Past Medical History:  Diagnosis Date   Acute colitis    Anemia    CAD (coronary artery disease)    a. heavy calcification of the entire LAD & mod eccentric mid LAD stenosis, estimated @ 70%, mild nonobs stenosis of RCA and LCx, severely calcified Ao valve with restricted valve mobility and 2+ AI     Cancer ([place]) 1981   breast   CHB (complete heart block) ([place]) 08/31/2015   [employer] DR  model [id number] (serial number  [id number] )    Chronic diastolic congestive heart failure ([place])    a. echo 08/31/2015: EF 65-70%, nl WM,  GR1DD, Ao valve stent bioprosthesis was present and functioning nl, no regurg, LA mildly dilated, PASP 46 mm Hg   Collagen vascular disease (HCC)    Colon polyps    Depression    Diabetes mellitus type II    Fibromyalgia    HTN (hypertension)    Hypercholesteremia    Hypothyroidism    IBS (irritable bowel syndrome)    Presence of permanent cardiac pacemaker    Rectal bleeding    Rheumatoid arthritis(714.0)    S/P TAVR (transcatheter aortic valve replacement) 08/30/2015   26 mm Edwards Sapien 3 transcatheter heart valve placed via open right transfemoral approach   Shortness of breath dyspnea    Stenosis of aortic valve    Suicide attempt ([place])    Thyroid disease     Past Surgical History:  Procedure Laterality Date   ABDOMINAL SURGERY      Intestinal surgery   CARDIAC SURGERY     CESAREAN SECTION     x 2   EP IMPLANTABLE DEVICE N/A 08/31/2015   Procedure: Pacemaker Implant;  Surgeon: Will Meredith Leeds, MD; [place] (serial number  [id number] ); Laterality: Right   ESOPHAGOGASTRODUODENOSCOPY (EGD) WITH PROPOFOL N/A 04/04/2021   Procedure: ESOPHAGOGASTRODUODENOSCOPY (EGD) WITH PROPOFOL;  Surgeon: Jonathon Bellows, MD;  Location: [facility] ENDOSCOPY;  Service: Gastroenterology;  Laterality: N/A;   LAPAROSCOPY N/A 01/31/2020   Procedure: LAPAROSCOPY DIAGNOSTIC;  Surgeon: Kieth Brightly Arta Bruce, MD;  Location: [place];  Service: General;  Laterality: N/A;   LAPAROTOMY N/A 01/31/2020   Procedure: Exploratory Laparotomy;  Surgeon: Kieth Brightly Arta Bruce, MD;  Location: [place];  Service: General;  Laterality: N/A;   LEFT OOPHORECTOMY     LYSIS OF ADHESION  01/31/2020   Procedure: Lysis Of Adhesions, Ligation of Falciform Vein;  Surgeon: Mickeal Skinner, MD;  Location: [place];  Service: General;;   MASTECTOMY Left    polyp      self inflicted chest wound     [place]   lower back   TEE WITHOUT CARDIOVERSION N/A 08/30/2015   Procedure: TRANSESOPHAGEAL ECHOCARDIOGRAM (TEE);  Surgeon: Sherren Mocha, MD;  Location: [place];  Service: Open Heart Surgery;  Laterality: N/A;   TOTAL ABDOMINAL HYSTERECTOMY     TRANSCATHETER AORTIC VALVE REPLACEMENT, TRANSFEMORAL N/A 08/30/2015   Procedure: TRANSCATHETER AORTIC VALVE REPLACEMENT, TRANSFEMORAL;  Surgeon: Sherren Mocha, MD;  Location: [place];  Service: Open Heart Surgery;  Laterality: N/A;     Home Medications:  Prior to Admission medications   Medication Sig Start Date End Date Taking? Authorizing Provider  acetaminophen (TYLENOL) 325 MG tablet Take 2 tablets (650 mg total) by mouth every 4 (four) hours as needed. 05/22/22  Yes Poggi, Clarnce Flock, PA-C  aspirin EC 81 MG tablet Take 1 tablet (81 mg total) by mouth daily. Swallow whole. 04/10/22  Yes Gollan, Kathlene November, MD   atorvastatin (LIPITOR) 40 MG tablet Take 1 tablet by mouth daily. 03/21/21  Yes [provider]  clonazePAM (KLONOPIN) 0.5 MG tablet Take 1 tablet (0.5 mg total) by mouth at bedtime. 05/02/22  Yes Arfeen, Arlyce Harman, MD  isosorbide mononitrate (IMDUR) 30 MG 24 hr tablet TAKE 1/2 OF A TABLET (15 MG TOTAL) BY MOUTH DAILY Patient taking differently: Take 15 mg by mouth daily. 06/14/22  Yes Minna Merritts, MD  levothyroxine (SYNTHROID) 112 MCG tablet Take 112 mcg by mouth daily. 03/21/21  Yes [provider]  losartan (COZAAR) 25 MG tablet Take 1 tablet (  25 mg total) by mouth daily. 03/09/22  Yes Annita Brod, MD  Multiple Vitamin (MULTIVITAMIN WITH MINERALS) TABS tablet Take 1 tablet by mouth daily.   Yes [provider]  nitroGLYCERIN (NITROSTAT) 0.4 MG SL tablet Place 1 tablet (0.4 mg total) under the tongue every 5 (five) minutes as needed for chest pain. 04/10/22  Yes Gollan, Kathlene November, MD  potassium chloride (KLOR-CON) 10 MEQ tablet TAKE 1 TABLET BY MOUTH EVERY OTHER DAY Patient taking differently: Take 10 mEq by mouth every other day. 01/11/21  Yes Dunn, Areta Haber, PA-C  sertraline (ZOLOFT) 50 MG tablet Take 2.5 tablets (125 mg total) by mouth daily. 05/02/22  Yes Arfeen, Arlyce Harman, MD  traZODone (DESYREL) 50 MG tablet Take 50 mg by mouth at bedtime. 08/04/20  Yes Anabel Bene, MD  cephALEXin (KEFLEX) 500 MG capsule Take 1 capsule (500 mg total) by mouth 2 (two) times daily. Patient not taking: Reported on 07/08/2022 06/11/22   Carrie Mew, MD  ondansetron (ZOFRAN) 4 MG tablet Take 1 tablet (4 mg total) by mouth every 8 (eight) hours as needed for up to 5 doses for nausea or vomiting. Patient not taking: Reported on 07/08/2022 01/27/22   Lucrezia Starch, MD    Inpatient Medications: Scheduled Meds:  aspirin EC  81 mg Oral Daily   atorvastatin  40 mg Oral Daily   clopidogrel  75 mg Oral Daily   levothyroxine  100 mcg Oral Q0600   potassium chloride  40 mEq Oral Q4H    sertraline  125 mg Oral Daily   traZODone  50 mg Oral QHS   Continuous Infusions:  PRN Meds: acetaminophen **OR** acetaminophen  Allergies:    Allergies  Allergen Reactions   Tetracyclines & Related Anaphylaxis and Rash   Sulfa Antibiotics Hives   Latex Rash    Social History:   Social History   Socioeconomic History   Marital status: Married    Spouse name: Not on file   Number of children: Not on file   Years of education: Not on file   Highest education level: Not on file  Occupational History   Not on file  Tobacco Use   Smoking status: Never   Smokeless tobacco: Never  Vaping Use   Vaping Use: Never used  Substance and Sexual Activity   Alcohol use: No    Alcohol/week: 0.0 standard drinks of alcohol   Drug use: No   Sexual activity: Not Currently  Other Topics Concern   Not on file  Social History Narrative   ** Merged History Encounter **       Social Determinants of Health   Financial Resource Strain: Not on file  Food Insecurity: Not on file  Transportation Needs: Not on file  Physical Activity: Not on file  Stress: Not on file  Social Connections: Not on file  Intimate Partner Violence: Not on file    Family History:    Family History  Problem Relation Age of Onset   Depression Sister    Depression Sister    Depression Sister    Hypertension Mother    Lung cancer Sister    Stroke Sister    Hypertension Sister    Pneumonia Brother    Heart attack Neg Hx      ROS:  Please see the history of present illness.  General:no colds or fevers, no weight changes Skin:no rashes or ulcers HEENT:no blurred vision, no congestion CV:see HPI PUL:see HPI GI:no diarrhea constipation or melena,  no indigestion GU:no hematuria, no dysuria except for one episode MS:no joint pain, no claudication Neuro:no syncope, no lightheadedness Endo:no diabetes, no thyroid disease  All other ROS reviewed and negative.     Physical Exam/Data:   Vitals:    07/08/22 2342 07/09/22 0430 07/09/22 0755 07/09/22 0814  BP: (!) 171/78 (!) 128/100 (!) 163/67 (!) 173/72  Pulse: 81 (!) 104 100 (!) 104  Resp: '20 18 16 15  '$ Temp: 98.2 F (36.8 C) 98 F (36.7 C) 97.8 F (36.6 C) 97.6 F (36.4 C)  TempSrc: Oral Oral Oral Oral  SpO2: 97% 97% 98% 98%   No intake or output data in the 24 hours ending 07/09/22 1436    06/11/2022   12:26 PM 05/22/2022   12:28 PM 05/02/2022   10:24 AM  Last 3 Weights  Weight (lbs) 149 lb 14.6 oz 150 lb   Weight (kg) 68 kg 68.04 kg      Information is confidential and restricted. Go to Review Flowsheets to unlock data.     There is no height or weight on file to calculate BMI.  General:  frail female, in no acute distress HEENT: normal Neck: no JVD Vascular: No carotid bruits; Distal pulses 1+ bilaterally Cardiac:  normal S1, S2; RRR; no murmur gallup rub or click Lungs:  clear to auscultation bilaterally, no wheezing, rhonchi or rales  Abd: soft, nontender, no hepatomegaly  Ext: no edema Musculoskeletal:  No deformities, BUE and BLE strength weak and equal Skin: warm and dry  Neuro:  alert but having trouble answering questions, could not give month, did have correct year did know her name Psych:  Normal affect    Relevant CV Studies: Echo 03/08/22 IMPRESSIONS     1. Left ventricular ejection fraction, by estimation, is 55%. The left  ventricle has normal function. The left ventricle demonstrates regional  wall motion abnormalities (septal wall motion abnormality likely secondary  to conduction abnormality). Left  ventricular diastolic parameters are consistent with Grade I diastolic  dysfunction (impaired relaxation).   2. Right ventricular systolic function is normal. The right ventricular  size is normal.   3. The mitral valve is normal in structure. No evidence of mitral valve  regurgitation. No evidence of mitral stenosis.   4. The aortic valve has been repaired/replaced. Aortic valve  regurgitation is  not visualized. No aortic stenosis is present. Aortic  valve area, by VTI measures 2.39 cm. Aortic valve mean gradient measures  4.0 mmHg.   5. The inferior vena cava is normal in size with greater than 50%  respiratory variability, suggesting right atrial pressure of 3 mmHg.   FINDINGS   Left Ventricle: Left ventricular ejection fraction, by estimation, is  55%. The left ventricle has normal function. The left ventricle  demonstrates regional wall motion abnormalities. The left ventricular  internal cavity size was normal in size. There is   no left ventricular hypertrophy. Left ventricular diastolic parameters  are consistent with Grade I diastolic dysfunction (impaired relaxation).   Right Ventricle: The right ventricular size is normal. No increase in  right ventricular wall thickness. Right ventricular systolic function is  normal.   Left Atrium: Left atrial size was normal in size.   Right Atrium: Right atrial size was normal in size.   Pericardium: There is no evidence of pericardial effusion.   Mitral Valve: The mitral valve is normal in structure. No evidence of  mitral valve regurgitation. No evidence of mitral valve stenosis.   Tricuspid Valve: The  tricuspid valve is normal in structure. Tricuspid  valve regurgitation is not demonstrated. No evidence of tricuspid  stenosis.   Aortic Valve: The aortic valve has been repaired/replaced. Aortic valve  regurgitation is not visualized. No aortic stenosis is present. Aortic  valve mean gradient measures 4.0 mmHg. Aortic valve peak gradient measures  6.2 mmHg. Aortic valve area, by VTI  measures 2.39 cm. There is a TAVR valve present in the aortic position.   Pulmonic Valve: The pulmonic valve was normal in structure. Pulmonic valve  regurgitation is not visualized. No evidence of pulmonic stenosis.   Aorta: The aortic root is normal in size and structure.   Venous: The inferior vena cava is normal in size with greater  than 50%  respiratory variability, suggesting right atrial pressure of 3 mmHg.   IAS/Shunts: No atrial level shunt detected by color flow Doppler.   Additional Comments: A device lead is visualized.   Laboratory Data:  High Sensitivity Troponin:   Recent Labs  Lab 06/11/22 1235 06/11/22 1706  TROPONINIHS 11 13     Chemistry Recent Labs  Lab 07/08/22 0920 07/08/22 0922 07/09/22 0826  NA 138 137 137  K 3.9 4.1 3.3*  CL 101 102 103  CO2 22  --  21*  GLUCOSE 119* 120* 111*  BUN '15 17 11  '$ CREATININE 0.78 0.60 0.77  CALCIUM 9.9  --  9.5  GFRNONAA >60  --  >60  ANIONGAP 15  --  13    Recent Labs  Lab 07/08/22 0920  PROT 7.2  ALBUMIN 4.1  AST 18  ALT 9  ALKPHOS 55  BILITOT 0.8   Lipids  Recent Labs  Lab 07/08/22 1211  CHOL 180  TRIG 105  HDL 47  LDLCALC 112*  CHOLHDL 3.8    Hematology Recent Labs  Lab 07/08/22 0920 07/08/22 0922 07/09/22 0826  WBC 10.2  --  10.4  RBC 4.65  --  4.67  HGB 13.2 13.9 13.3  HCT 41.6 41.0 40.8  MCV 89.5  --  87.4  MCH 28.4  --  28.5  MCHC 31.7  --  32.6  RDW 13.2  --  13.2  PLT 270  --  254   Thyroid  Recent Labs  Lab 07/08/22 1211  TSH 0.048*    BNPNo results for input(s): "BNP", "PROBNP" in the last 168 hours.  DDimer No results for input(s): "DDIMER" in the last 168 hours.   Radiology/Studies:  VAS [place] LOWER EXTREMITY VENOUS (DVT)  Result Date: 07/09/2022  Lower Venous DVT Study Patient Name:  RAINELLE SULEWSKI  Date of Exam:   07/09/2022 Medical Rec #: [id number]       Accession #:    [id number] Date of Birth: 05-19-1941       Patient Gender: F Patient Age:   77 years Exam Location:  [facility] [place] Procedure:      VAS [place] LOWER EXTREMITY VENOUS (DVT) Referring Phys: Cornelius Moras XU --------------------------------------------------------------------------------  Indications: Stroke.  Comparison Study: no prior Performing Technologist: Archie Patten RVS  Examination Guidelines: A complete evaluation includes B-mode  imaging, spectral Doppler, color Doppler, and power Doppler as needed of all accessible portions of each vessel. Bilateral testing is considered an integral part of a complete examination. Limited examinations for reoccurring indications may be performed as noted. The reflux portion of the exam is performed with the patient in reverse Trendelenburg.  +---------+---------------+---------+-----------+----------+--------------+ RIGHT    CompressibilityPhasicitySpontaneityPropertiesThrombus Aging +---------+---------------+---------+-----------+----------+--------------+ CFV      Full  Yes      Yes                                 +---------+---------------+---------+-----------+----------+--------------+ SFJ      Full                                                        +---------+---------------+---------+-----------+----------+--------------+ FV Prox  Full                                                        +---------+---------------+---------+-----------+----------+--------------+ FV Mid   Full                                                        +---------+---------------+---------+-----------+----------+--------------+ FV DistalFull                                                        +---------+---------------+---------+-----------+----------+--------------+ PFV      Full                                                        +---------+---------------+---------+-----------+----------+--------------+ POP      Full           Yes      Yes                                 +---------+---------------+---------+-----------+----------+--------------+ PTV      Full                                                        +---------+---------------+---------+-----------+----------+--------------+ PERO     Full                                                        +---------+---------------+---------+-----------+----------+--------------+    +---------+---------------+---------+-----------+----------+--------------+ LEFT     CompressibilityPhasicitySpontaneityPropertiesThrombus Aging +---------+---------------+---------+-----------+----------+--------------+ CFV      Full           Yes      Yes                                 +---------+---------------+---------+-----------+----------+--------------+ SFJ  Full                                                        +---------+---------------+---------+-----------+----------+--------------+ FV Prox  Full                                                        +---------+---------------+---------+-----------+----------+--------------+ FV Mid   Full                                                        +---------+---------------+---------+-----------+----------+--------------+ FV DistalFull                                                        +---------+---------------+---------+-----------+----------+--------------+ PFV      Full                                                        +---------+---------------+---------+-----------+----------+--------------+ POP      Full           Yes      Yes                                 +---------+---------------+---------+-----------+----------+--------------+ PTV      Full                                                        +---------+---------------+---------+-----------+----------+--------------+ PERO     Full                                                        +---------+---------------+---------+-----------+----------+--------------+     Summary: BILATERAL: - No evidence of deep vein thrombosis seen in the lower extremities, bilaterally. -No evidence of popliteal cyst, bilaterally.   *See table(s) above for measurements and observations. Electronically signed by Servando Snare MD on 07/09/2022 at 1:35:33 PM.    Final    CT HEAD WO CONTRAST (5MM)  Result Date: 07/09/2022 CLINICAL  DATA:  Provided history: Stroke, follow-up. EXAM: CT HEAD WITHOUT CONTRAST TECHNIQUE: Contiguous axial images were obtained from the base of the skull through the vertex without intravenous contrast. RADIATION DOSE REDUCTION: This exam was performed according to the departmental dose-optimization program which includes automated exposure control, adjustment of the mA and/or kV according to patient  size and/or use of iterative reconstruction technique. COMPARISON:  Non-contrast head CT and CT angiogram head/neck 07/08/2022. Brain MRI 08/15/2015. FINDINGS: Brain: Moderate generalized cerebral atrophy. Mild patchy and ill-defined hypoattenuation within the cerebral white matter, nonspecific but compatible with chronic small vessel ischemic disease. There is no acute intracranial hemorrhage. No demarcated cortical infarct. No extra-axial fluid collection. No evidence of an intracranial mass. No midline shift. Vascular: No hyperdense vessel. Atherosclerotic calcifications. Skull: No fracture or aggressive osseous lesion. Sinuses/Orbits: No mass or acute finding within the imaged orbits. Opacification of a posterior right ethmoid air cell. IMPRESSION: No evidence of acute intracranial abnormality. Mild chronic small vessel ischemic changes within the cerebral white matter. Moderate generalized cerebral atrophy. Right ethmoid sinusitis. Electronically Signed   By: Kellie Simmering D.O.   On: 07/09/2022 10:20   DG Cervical Spine Complete  Result Date: 07/08/2022 CLINICAL DATA:  24097, pain EXAM: CERVICAL SPINE - COMPLETE 4+ VIEW COMPARISON:  Same day CT neck FINDINGS: The cervical spine is visualized from C1-C6. Minimal retrolisthesis C5-6. Vertebral body heights are maintained: no evidence of acute fracture. Mild to moderate intervertebral disc space height loss at C5-6. Severe multilevel facet arthropathy and uncovertebral hypertrophy results in multilevel osseous neuroforaminal narrowing most pronounced in the mid  cervical spine. No prevertebral soft tissue swelling. Atherosclerotic calcifications. IMPRESSION: Multilevel degenerative changes. Electronically Signed   By: Valentino Saxon M.D.   On: 07/08/2022 13:25   CT ANGIO HEAD NECK W WO CM W PERF (CODE STROKE)  Result Date: 07/08/2022 CLINICAL DATA:  Code stroke with deficit not provided. EXAM: CT ANGIOGRAPHY HEAD AND NECK CT PERFUSION BRAIN TECHNIQUE: Multidetector CT imaging of the head and neck was performed using the standard protocol during bolus administration of intravenous contrast. Multiplanar CT image reconstructions and MIPs were obtained to evaluate the vascular anatomy. Carotid stenosis measurements (when applicable) are obtained utilizing NASCET criteria, using the distal internal carotid diameter as the denominator. Multiphase CT imaging of the brain was performed following IV bolus contrast injection. Subsequent parametric perfusion maps were calculated using RAPID software. RADIATION DOSE REDUCTION: This exam was performed according to the departmental dose-optimization program which includes automated exposure control, adjustment of the mA and/or kV according to patient size and/or use of iterative reconstruction technique. CONTRAST:  7m OMNIPAQUE IOHEXOL 350 MG/ML SOLN COMPARISON:  Head CT from earlier today FINDINGS: CTA NECK FINDINGS Aortic arch: Limited coverage shows atheromatous plaque. Great vessel origins are not seen. Right carotid system: Atheromatous plaque at the bifurcation and proximal ICA with proximal ICA stenosis at least measuring 65%. No ulceration or beading. Left carotid system: Fairly bulky calcified plaque at the bifurcation. Proximal ICA stenosis measures 40%. No ulceration or beading Vertebral arteries: Atherosclerosis of the subclavian arteries without visible flow reducing stenosis. The vertebral arteries are widely patent to the dura. Skeleton: Bulky degenerative endplate and facet spurring. Other neck: Left scalp  hematoma without acute fracture. Upper chest: Negative Review of the MIP images confirms the above findings CTA HEAD FINDINGS Anterior circulation: Suboptimal small-vessel detail due to bolus quality. Confluent atheromatous plaque on the carotid siphons. Right ICA narrowing measures up to 50% the paraclinoid segment, measurement limited by calcified plaque blooming. Early branching right MCA with moderate stenosis of the lower branch. Branch vessel atherosclerosis and vessel irregularity affecting both MCAs. Posterior circulation: Proximal basilar fenestration. The vertebral and basilar arteries are widely patent. No evidence of cerebellar branch occlusion. Atheromatous irregularity of the posterior cerebral arteries with especially advanced right P3 segment stenosis. Moderate to  advanced left P1 2 junction stenosis. Venous sinuses: Diffusely patent Anatomic variants: As above Review of the MIP images confirms the above findings CT Brain Perfusion Findings: CBF (<30%) Volume: 36m Perfusion (Tmax>6.0s) volume: 097mIMPRESSION: 1. No emergent finding by CTA and CTP. 2. Atherosclerosis in the neck causes proximal ICA stenosis of 65% on the right and 40% on the left. 3. Extensive intracranial atherosclerosis most notably causing bilateral high-grade PCA stenosis and moderate right M1 stenosis. 4. Great vessel origins are not covered. Electronically Signed   By: JoJorje Guild.D.   On: 07/08/2022 10:02   CT HEAD CODE STROKE WO CONTRAST  Result Date: 07/08/2022 CLINICAL DATA:  Code stroke. EXAM: CT HEAD WITHOUT CONTRAST TECHNIQUE: Contiguous axial images were obtained from the base of the skull through the vertex without intravenous contrast. RADIATION DOSE REDUCTION: This exam was performed according to the departmental dose-optimization program which includes automated exposure control, adjustment of the mA and/or kV according to patient size and/or use of iterative reconstruction technique. COMPARISON:  06/11/2022  FINDINGS: Brain: No evidence of acute infarction, hemorrhage, hydrocephalus, extra-axial collection or mass lesion/mass effect. Age normal appearance of the brain Vascular: No hyperdense vessel or unexpected calcification. Skull: High left scalp swelling. Sinuses/Orbits: No acute finding. Other: These results were communicated to Dr. StQuinn Axet 9:26 am on 07/08/2022 by text page via the [facility] system. ASPECTS ([facility] Early CT Score) Not scored without localizing symptoms IMPRESSION: 1. Negative for hemorrhage or visible infarct. 2. Left scalp swelling. Electronically Signed   By: JoJorje Guild.D.   On: 07/08/2022 09:29     Assessment and Plan:   Tachycardia - appears ST with V pacing. Possibility of a flutter with V pacing.  ?BB in past. If ok for neurology would add BB 25 BID, but with need for permissive HTN would just monitor.  Will interrogate PPM to see if any atrial fib/flutter. Earlier today was a sensed, V pacing but with increased rate will recheck Possible CVA with Rt sided weakness and fall. Per IM and Neuro Hx of CAD with 70% mLAD stenosis in 2016 with last cath. Is on ASA 81 mg. Hx of TAVR in 2016 and last echo 03/2022 was stable.  The aortic valve has been repaired/replaced. Aortic valve regurgitation is not visualized. No aortic stenosis is present. Aortic valve mean gradient measures 4.0 mmHg. Aortic valve peak gradient measures 6.2 mmHg. Aortic valve area, by VTI  measures 2.39 cm. There is a TAVR valve present in the aortic position.  Hx CHB with need for PPM post TAVR. Hypothyroidism with low TSH well defer to primary team   synthroid adjusted. HTN at home on losartan, imdur 15 mg may add BB to improve though may need permissive HTN with stroke Chronic abd pain/anxiety depression/neck pain per IM HLD on lipitor 40 continue LDL was 112 so would increase or add zetia with known CAD. Hypokalemia was replaced with 40 meq   Risk Assessment/Risk Scores:                 For questions or updates, please contact [email or web address] consult [email or web address] for contact info under    Signed, LaCecilie KicksNP  07/09/2022 2:36 PM  Patient seen and examined and agree with LaCecilie KicksNP as detailed above.   In brief, the patient is an [age]ear old with history of CAD, severe AS s/p TAVR, complete heart block s/p PPM placement, TIA, CVA, chronic HFpEF, DMII, and hypothyroidism who presented  to the ER with fall with headstrike, weakness, right sided facial droop and confusion with concern for stroke. Cardiology is consulted for tachycardia with HR 122.  Patient with known cardiac history. Has known CAD with 70% LAD stenosis on cath in 2016. Also with history of severe AS s/p TAVR and CHB s/p PPM placement. Was seen in 03/2022 with chest pain with reassuring work-up with normal trops. Last TTE 03/2022 with LVEF 55%, G1DD, normal RV, well functioning TAVR.   She presented on this admission with concern for stroke like symptoms. CT head with no acute pathology. MRI head pending. Her course has been complicated by tachycardia with HR 100-130s for which Cardiology was consulted.  Device interrogation today reveals sinus tachycardia and episodes of 2:1 AVB. No Afib/flutter. No further work-up needed from a CV perspective as likely this is compensatory in the setting of acute illness. Okay to resume beta blocker from cardiology standpoint.  GEN: Elderly female, confused, answering question appropriately   Neck: No JVD Cardiac: Tachycardic, regular, 1/6 systolic murmur Respiratory: Clear to auscultation bilaterally. GI: Soft, nontender, non-distended  MS: No edema; No deformity. Neuro:  Able to state her name, year and location. Intermittently confused during exam.  Psych: Calm  Plan: -No Afib/flutter detected on pacemaker interrogation -Okay to resume metoprolol from CV perspective  -Management of possible stroke per neuro -No further CV work-up at this  time  Cardiology will sign-off at this time.  Gwyndolyn Kaufman, MD

## 2022-07-09 NOTE — Progress Notes (Signed)
Lower extremity venous duplex has been completed.   Preliminary results in CV Proc.   Catherine [name] 07/09/2022 9:46 AM

## 2022-07-09 NOTE — Evaluation (Signed)
Occupational Therapy Evaluation Patient Details Name: Catherine [name] MRN: [id number] DOB: 06/05/1941 Today's Date: 07/09/2022   History of Present Illness Patient is a 81 yo female admitted to ED on 07/08/22 with stroke like symptoms. CT negative, no emergent finding with CTA and CTP.  Patient does have extensive intracranial atherosclerosis with bilateral high-grade PCA stenosis and moderate right M1 stenosis.. MRI pending. PMH includes: CAD, CHF, RA, DM2, HTN and heart block with PPM   Clinical Impression    Prior to this admission, patient living with husband with her son checking on her weekly. Patient ambulating with a walker at baseline, however patient poor historian and perseverative therefore current home history is not accurate. Currently, patient presenting with decreased cognition, perseveration, poor insight into safety and overall awareness, and increased HR and BP. Patients HR at 120 at rest with BP assessed sitting EOB to be 150/137 (143) and then with seated rest break at 133/110 (118) therefore transfers and ambulation deferred. Due to numerous deficits and family support, OT recommending AIR placement in order to return to prior level of function. OT will continue to follow acutely.    Recommendations for follow up therapy are one component of a multi-disciplinary discharge planning process, led by the attending physician.  Recommendations may be updated based on patient status, additional functional criteria and insurance authorization.   Follow Up Recommendations  Acute inpatient rehab (3hours/day)    Assistance Recommended at Discharge Intermittent Supervision/Assistance  Patient can return home with the following A lot of help with walking and/or transfers;A lot of help with bathing/dressing/bathroom;Assistance with cooking/housework;Direct supervision/assist for medications management;Direct supervision/assist for financial management;Assist for transportation;Help with stairs  or ramp for entrance    Functional Status Assessment  Patient has had a recent decline in their functional status and demonstrates the ability to make significant improvements in function in a reasonable and predictable amount of time.  Equipment Recommendations  Other (comment) (Will continue to assess)    Recommendations for Other Services Rehab consult     Precautions / Restrictions Precautions Precautions: Fall;Other (comment) Precaution Comments: Watch HR and BP Restrictions Weight Bearing Restrictions: No      Mobility Bed Mobility Overal bed mobility: Needs Assistance Bed Mobility: Supine to Sit     Supine to sit: Supervision     General bed mobility comments: able to come into sitting with supervision, left sitting EOB with MD and RN at end of session    Transfers                   General transfer comment: unable to progress due to increased HR and BP      Balance Overall balance assessment: Mild deficits observed, not formally tested                                         ADL either performed or assessed with clinical judgement   ADL Overall ADL's : Needs assistance/impaired Eating/Feeding: Set up;Sitting   Grooming: Set up;Sitting   Upper Body Bathing: Minimal assistance;Sitting   Lower Body Bathing: Moderate assistance;Sitting/lateral leans;Sit to/from stand   Upper Body Dressing : Minimal assistance;Sitting   Lower Body Dressing: Moderate assistance;Sitting/lateral leans;Sit to/from stand               Functional mobility during ADLs: Moderate assistance;Cueing for safety;Cueing for sequencing General ADL Comments: Patient presenting with confusion, minimal impulsivity, increased HR  and BP therefore full evaluation limited     Vision Baseline Vision/History: 1 Wears glasses Ability to See in Adequate Light: 0 Adequate Vision Assessment?: Vision impaired- to be further tested in functional context Additional  Comments: Patient poor historian and unable to follow basic commands when MD attempting vision assessment, OT to assess in further context     Perception     Praxis      Pertinent Vitals/Pain Pain Assessment Pain Assessment: Faces Faces Pain Scale: No hurt Pain Intervention(s): Limited activity within patient's tolerance, Repositioned     Hand Dominance     Extremity/Trunk Assessment Upper Extremity Assessment Upper Extremity Assessment: RUE deficits/detail;Generalized weakness RUE Coordination: decreased fine motor;decreased gross motor   Lower Extremity Assessment Lower Extremity Assessment: Defer to PT evaluation   Cervical / Trunk Assessment Cervical / Trunk Assessment: Kyphotic (Minimally)   Communication Communication Communication: Expressive difficulties   Cognition Arousal/Alertness: Awake/alert Behavior During Therapy: Restless, Impulsive Overall Cognitive Status: Impaired/Different from baseline Area of Impairment: Memory, Following commands, Safety/judgement, Awareness, Problem solving, Attention                   Current Attention Level: Focused Memory: Decreased recall of precautions, Decreased short-term memory Following Commands: Follows one step commands with increased time, Follows multi-step commands inconsistently Safety/Judgement: Decreased awareness of safety, Decreased awareness of deficits Awareness: Emergent Problem Solving: Difficulty sequencing, Requires verbal cues, Requires tactile cues General Comments: Patient oriented to basic questions, however perseverative on calling husband when OT asked what happened, patient also stated she lived alone, but then stated she had a husband     General Comments       Exercises     Shoulder Instructions      Home Living Family/patient expects to be discharged to:: Private residence Living Arrangements: Spouse/significant other Available Help at Discharge: Family Type of Home: House                            Additional Comments: MD present for remaining part of evaluation, therefore home history was not able to be obtained, patient also poor historian      Prior Functioning/Environment Prior Level of Function : Needs assist;Patient poor historian/Family not available             Mobility Comments: requring a RW, patient perseverative and easily distracted ADLs Comments: states independence but will need follow up        OT Problem List: Decreased range of motion;Decreased activity tolerance;Decreased strength;Impaired balance (sitting and/or standing);Decreased coordination;Decreased cognition;Decreased safety awareness;Decreased knowledge of use of DME or AE;Decreased knowledge of precautions;Impaired UE functional use;Cardiopulmonary status limiting activity      OT Treatment/Interventions: Self-care/ADL training;Therapeutic exercise;Neuromuscular education;Energy conservation;DME and/or AE instruction;Manual therapy;Therapeutic activities;Cognitive remediation/compensation;Visual/perceptual remediation/compensation;Patient/family education;Balance training    OT Goals(Current goals can be found in the care plan section) Acute Rehab OT Goals Patient Stated Goal: unable to state OT Goal Formulation: Patient unable to participate in goal setting Time For Goal Achievement: 07/23/22 Potential to Achieve Goals: Good ADL Goals Pt Will Perform Lower Body Bathing: Independently;sit to/from stand;sitting/lateral leans;with adaptive equipment Pt Will Perform Lower Body Dressing: Independently;with adaptive equipment;sitting/lateral leans;sit to/from stand Pt Will Transfer to Toilet: with min assist;ambulating Pt Will Perform Toileting - Clothing Manipulation and hygiene: with min guard assist;with adaptive equipment;sitting/lateral leans;sit to/from stand Additional ADL Goal #1: Patient will be able to complete 3 step trail making task in order to simulate higher  level ADLs and  IADLs for safe return home. Additional ADL Goal #2: Patient will be able to complete higher level ADL or IADL without need for cues to maintain attention or complete correctly to promote eventual independence at home.  OT Frequency: Min 2X/week    Co-evaluation              AM-PAC OT "6 Clicks" Daily Activity     Outcome Measure Help from another person eating meals?: A Little Help from another person taking care of personal grooming?: A Little Help from another person toileting, which includes using toliet, bedpan, or urinal?: A Lot Help from another person bathing (including washing, rinsing, drying)?: A Lot Help from another person to put on and taking off regular upper body clothing?: A Little Help from another person to put on and taking off regular lower body clothing?: A Lot 6 Click Score: 15   End of Session Nurse Communication: Mobility status;Other (comment) (Increased HR and BP)  Activity Tolerance: Treatment limited secondary to medical complications (Comment) Patient left: in bed;with call bell/phone within reach;with nursing/sitter in room  OT Visit Diagnosis: Unsteadiness on feet (R26.81);Muscle weakness (generalized) (M62.81);History of falling (Z91.81);Other symptoms and signs involving the nervous system (R29.898);Other symptoms and signs involving cognitive function                Time: 1249-1305 OT Time Calculation (min): 16 min Charges:  OT General Charges $OT Visit: 1 Visit OT Evaluation $OT Eval Moderate Complexity: 1 Mod  Corinne Ports E. Quintella Mura, OTR/L Acute Rehabilitation Services [phone]   Ascencion Dike 07/09/2022, 2:11 PM

## 2022-07-09 NOTE — Progress Notes (Addendum)
STROKE TEAM PROGRESS NOTE   INTERVAL HISTORY Patient with tachycardia 110s intermittently, stable BP , Tmax 99.7. RR 15 on room air. No acute events.  No visitors at bedside.  We discussed recollection of yesterday's events which she recalls with clarity but is not clearly able to relate her medical history or medications. We discussed her current symptoms, ongoing work up, diagnosis and plan of care including negative head CT x2 with no clear indication of stroke at this time but perhaps a TIA. She did not have questions.     Vitals:   07/08/22 2342 07/09/22 0430 07/09/22 0755 07/09/22 0814  BP: (!) 171/78 (!) 128/100 (!) 163/67 (!) 173/72  Pulse: 81 (!) 104 100 (!) 104  Resp: '20 18 16 15  '$ Temp: 98.2 F (36.8 C) 98 F (36.7 C) 97.8 F (36.6 C) 97.6 F (36.4 C)  TempSrc: Oral Oral Oral Oral  SpO2: 97% 97% 98% 98%   CBC:  Recent Labs  Lab 07/08/22 0920 07/08/22 0922 07/09/22 0826  WBC 10.2  --  10.4  NEUTROABS 7.7  --   --   HGB 13.2 13.9 13.3  HCT 41.6 41.0 40.8  MCV 89.5  --  87.4  PLT 270  --  742   Basic Metabolic Panel:  Recent Labs  Lab 07/08/22 0920 07/08/22 0922 07/09/22 0826  NA 138 137 137  K 3.9 4.1 3.3*  CL 101 102 103  CO2 22  --  21*  GLUCOSE 119* 120* 111*  BUN '15 17 11  '$ CREATININE 0.78 0.60 0.77  CALCIUM 9.9  --  9.5   Lipid Panel:  Recent Labs  Lab 07/08/22 1211  CHOL 180  TRIG 105  HDL 47  CHOLHDL 3.8  VLDL 21  LDLCALC 112*   HgbA1c:  Recent Labs  Lab 07/08/22 1211  HGBA1C 5.4   Urine Drug Screen: No results for input(s): "LABOPIA", "COCAINSCRNUR", "LABBENZ", "AMPHETMU", "THCU", "LABBARB" in the last 168 hours.  Alcohol Level  Recent Labs  Lab 07/08/22 0920  ETH <10    IMAGING past 24 hours VAS [place] LOWER EXTREMITY VENOUS (DVT)  Result Date: 07/09/2022  Lower Venous DVT Study Patient Name:  KIMBERLEA SCHLAG  Date of Exam:   07/09/2022 Medical Rec #: [id number]       Accession #:    [id number] Date of Birth: 1941-03-02       Patient  Gender: F Patient Age:   81 years Exam Location:  [facility] Procedure:      VAS [place] LOWER EXTREMITY VENOUS (DVT) Referring Phys: Cornelius Moras Rhylee Pucillo --------------------------------------------------------------------------------  Indications: Stroke.  Comparison Study: no prior Performing Technologist: Archie Patten RVS  Examination Guidelines: A complete evaluation includes B-mode imaging, spectral Doppler, color Doppler, and power Doppler as needed of all accessible portions of each vessel. Bilateral testing is considered an integral part of a complete examination. Limited examinations for reoccurring indications may be performed as noted. The reflux portion of the exam is performed with the patient in reverse Trendelenburg.  +---------+---------------+---------+-----------+----------+--------------+ RIGHT    CompressibilityPhasicitySpontaneityPropertiesThrombus Aging +---------+---------------+---------+-----------+----------+--------------+ CFV      Full           Yes      Yes                                 +---------+---------------+---------+-----------+----------+--------------+ SFJ      Full                                                        +---------+---------------+---------+-----------+----------+--------------+  FV Prox  Full                                                        +---------+---------------+---------+-----------+----------+--------------+ FV Mid   Full                                                        +---------+---------------+---------+-----------+----------+--------------+ FV DistalFull                                                        +---------+---------------+---------+-----------+----------+--------------+ PFV      Full                                                        +---------+---------------+---------+-----------+----------+--------------+ POP      Full           Yes      Yes                                  +---------+---------------+---------+-----------+----------+--------------+ PTV      Full                                                        +---------+---------------+---------+-----------+----------+--------------+ PERO     Full                                                        +---------+---------------+---------+-----------+----------+--------------+   +---------+---------------+---------+-----------+----------+--------------+ LEFT     CompressibilityPhasicitySpontaneityPropertiesThrombus Aging +---------+---------------+---------+-----------+----------+--------------+ CFV      Full           Yes      Yes                                 +---------+---------------+---------+-----------+----------+--------------+ SFJ      Full                                                        +---------+---------------+---------+-----------+----------+--------------+ FV Prox  Full                                                        +---------+---------------+---------+-----------+----------+--------------+  FV Mid   Full                                                        +---------+---------------+---------+-----------+----------+--------------+ FV DistalFull                                                        +---------+---------------+---------+-----------+----------+--------------+ PFV      Full                                                        +---------+---------------+---------+-----------+----------+--------------+ POP      Full           Yes      Yes                                 +---------+---------------+---------+-----------+----------+--------------+ PTV      Full                                                        +---------+---------------+---------+-----------+----------+--------------+ PERO     Full                                                         +---------+---------------+---------+-----------+----------+--------------+     Summary: BILATERAL: - No evidence of deep vein thrombosis seen in the lower extremities, bilaterally. -No evidence of popliteal cyst, bilaterally.   *See table(s) above for measurements and observations. Electronically signed by Servando Snare MD on 07/09/2022 at 1:35:33 PM.    Final    CT HEAD WO CONTRAST (5MM)  Result Date: 07/09/2022 CLINICAL DATA:  Provided history: Stroke, follow-up. EXAM: CT HEAD WITHOUT CONTRAST TECHNIQUE: Contiguous axial images were obtained from the base of the skull through the vertex without intravenous contrast. RADIATION DOSE REDUCTION: This exam was performed according to the departmental dose-optimization program which includes automated exposure control, adjustment of the mA and/or kV according to patient size and/or use of iterative reconstruction technique. COMPARISON:  Non-contrast head CT and CT angiogram head/neck 07/08/2022. Brain MRI 08/15/2015. FINDINGS: Brain: Moderate generalized cerebral atrophy. Mild patchy and ill-defined hypoattenuation within the cerebral white matter, nonspecific but compatible with chronic small vessel ischemic disease. There is no acute intracranial hemorrhage. No demarcated cortical infarct. No extra-axial fluid collection. No evidence of an intracranial mass. No midline shift. Vascular: No hyperdense vessel. Atherosclerotic calcifications. Skull: No fracture or aggressive osseous lesion. Sinuses/Orbits: No mass or acute finding within the imaged orbits. Opacification of a posterior right ethmoid air cell. IMPRESSION: No evidence of acute intracranial abnormality. Mild chronic small vessel ischemic changes within the cerebral white matter.  Moderate generalized cerebral atrophy. Right ethmoid sinusitis. Electronically Signed   By: Kellie Simmering D.O.   On: 07/09/2022 10:20    PHYSICAL EXAM GENERAL: Awake, alert, in no acute distress Psych: Patient is calm and  cooperative with examination Head: Normocephalic and atraumatic, without obvious abnormality EENT: Cervical collar in place on arrival, no OP obstruction LUNGS: Normal respiratory effort. Non-labored breathing on room air CV: Paced rhythm on telemetry, tachycardic ABDOMEN: Soft, non-tender, non-distended Extremities: Warm, well perfused, right greater > left knee ecchymosis and edema noted from recent fall   NEURO:  Mental Status: Awake, alert, and oriented to person, place and situation. Slow processing evident. Easily distracted. Re-focusing/cueing by examiner  to maintain attention required. She is able to follow simple one step commands most of the time but not able to follow multistep commands reliably.  Speech/Language: speech is clear.  Naming intact. Patient names 3/3 objects. Cranial Nerves:  II: PERRL 3 mm/brisk. Visual fields full.  III, IV, VI: EOMI without ptosis, gaze preference, nystagmus V: Sensation is intact to light touch and symmetrical to face. Blinks to threat.  VII: Face is symmetric VIII: Mildly hard of hearing IX, X: Palate elevation is symmetric. Phonation normal.  XI: Head turning intact  XII: Tongue protrudes midline though with minimal protrusion Motor: MAE spontaneously and purposefully at at least antigravity strength. Not completely cooperative with UE testing but able to grip bilaterally symmetrical 4/5. Bilat UE with Holds bilat legs symmetrically off bed for count of 3.  Sensation: Intact to light touch bilaterally in all four extremities. Coordination: Bilat UE intention tremor is present, Able to participate with finger to nose bilaterally with intention type tremor interference bilaterally.  Gait: Deferred for patient safety  ASSESSMENT/PLAN 81 y.o. with PMHx significant for HTN, CAD, DM2, chronic diastolic CHF, RA, hypothyroidism, complete heart block s/p PPM (not MRI compatible)  who presented to the ED on 8/6 after sustaining a mechanical fall from  standing at home with right-sided weakness, right facial droop, and slurred speech.  Patient complains of neck and head pain on arrival. Initial and repeat CT imaging of head are negative for stroke. CTA head and neck with extensive intracranial atherosclerosis. BLE doppler negative. April 2023 Echo shows EF 55%. LDL 112. Hgb AIC 5.4.   Episode of right-sided weakness, right facial droop, and slurred speech - possible TIA in setting of extensive intracranial atherosclerosis.  Code Stroke CT head 8/6 Negative for hemorrhage or visible infarct. Left scalp swelling. Repeat CT head 8/7 No evidence of acute intracranial abnormality. CTA head & neck Atherosclerosis in the neck causes proximal ICA stenosis of 65% on the right and 40% on the left. Extensive intracranial atherosclerosis most notably causing bilateral high-grade PCA stenosis and moderate right M1 stenosis. MRI: unable to obtain due to incompatibility with pacemaker BLE Doppler no DVT 2D Echo (April 2023) EF 55% with no shunt or thrombus detected  Pacemaker interrogation showed atrial arrhythmia but no A-fib or a flutter. LDL 112 HgbA1c 5.4 VTE prophylaxis -SCDs On ASA 81 PTA, now on DAPT with ASA '81mg'$  and Plavix '75mg'$  x 21 days then plavix alone as monotherapy for life  Therapy recommendations:  CIR Disposition:  TBD  Hypertension Stable Long-term BP goal normotensive  Hyperlipidemia Home meds:  Lipitor '40mg'$  LDL 112, not at goal < 70 Recommend increase dose of Lipitor from '40mg'$  to '80mg'$   Continue Lipitor '80mg'$  at discharge  Other Stroke Risk Factors Advanced Age >/= 33  Family hx stroke (sister) CAD  Other  Active Problems S/p mechanical fall  Heart block with pacemaker RA  Hospital day # 1  This patient was seen and evaluated with Dr. Erlinda Hong.  He directed the plan of care.  Please see AMION for Stroke team contact information.  Charlene Brooke, NP-C   ATTENDING NOTE: I reviewed above note and agree with the  assessment and plan. Pt was seen and examined.   81 year old female with history of hypertension, CAD, diabetes, RA, CHF, heart block with pacemaker admitted for episode of right-sided weakness, right facial droop, slurred speech with mechanical fall causing headache and neck pain.  CT no acute abnormality.  CT head and neck right ICA 65%, left ICA 40% stenosis, bilateral high-grade PCA stenosis, moderate right M1 stenosis.  CT perfusion negative.  LE venous Doppler negative.  Not able to have MRI due to incompatible pacemaker.  Repeat CT after 24 hours showed no acute finding.  EF 55% in 03/2022.  Pacer interrogation no A-fib although evidence of atrial arrhythmia.  LDL 112, A1c 5.4.  Creatinine 0.60.  On exam, RN and PT at bedside.  Patient has mild tachycardia 110's and BP 130s/110s, awake alert, orientated to place age and time.  Mild psychomotor slowing however no aphasia, able to name repeat, follow simple commands.  No gaze palsy, visual field full, no facial asymmetry.  Moving all extremities symmetrically, sensory symmetrical and bilateral finger-nose intact grossly.  Etiology of patient episode could be possible TIA given risk factors and intracranial atherosclerosis.  Neuro intact currently not consistent with stroke.  Pacemaker interrogation not consistent with A-fib or a flutter.  Recommend aspirin 81 and Plavix 75 DAPT for 3 weeks and then Plavix alone.  Increase home Lipitor 40 to 80 given intracranial atherosclerosis and LDL not at goal.  Aggressive risk factor modification.  PT therapy recommend CIR.  For detailed assessment and plan, please refer to above/below as I have made changes wherever appropriate.   Neurology will sign off. Please call with questions. Pt will follow up with stroke clinic NP at [facility] in about 4 weeks. Thanks for the consult.   Rosalin Hawking, MD PhD Stroke Neurology 07/09/2022 5:25 PM    To contact Stroke Continuity provider, please refer to [email or web address] After  hours, contact General Neurology

## 2022-07-09 NOTE — Evaluation (Signed)
Physical Therapy Evaluation Patient Details Name: Catherine [name] MRN: [id number] DOB: 19-Sep-1941 Today's Date: 07/09/2022  History of Present Illness  Patient is a 81 yo female admitted to ED on 07/08/22 with stroke like symptoms. CT negative, no emergent finding with CTA and CTP.  Patient does have extensive intracranial atherosclerosis with bilateral high-grade PCA stenosis and moderate right M1 stenosis.. MRI pending. PMH includes: CAD, CHF, RA, DM2, HTN and heart block with PPM  Clinical Impression  Pt was seen for assessment of mobility on RW and with monitoring of vitals for her evaluation. Pt had max HR of 121 during session, BP was up to 173/72 for standing and so walked with her a short trip for assessment of stability.  Pt is having mild buckling and tremors on RLE especially today and would therefore recommend CIR consideration for strengthening and then return home with husband.  Has stairs to enter the house, but unclear how many are inside the home per pt, who is struggling with some details.  Follow acutely for strengthening, balance training and recovery of safety and independence with gait.  Will need some input from family to get her details for home validated.     Recommendations for follow up therapy are one component of a multi-disciplinary discharge planning process, led by the attending physician.  Recommendations may be updated based on patient status, additional functional criteria and insurance authorization.  Follow Up Recommendations Acute inpatient rehab (3hours/day)      Assistance Recommended at Discharge Frequent or constant Supervision/Assistance  Patient can return home with the following  A little help with walking and/or transfers;A little help with bathing/dressing/bathroom;Assistance with cooking/housework;Direct supervision/assist for medications management;Direct supervision/assist for financial management;Assist for transportation;Help with stairs or ramp for  entrance    Equipment Recommendations None recommended by PT  Recommendations for Other Services  Rehab consult    Functional Status Assessment Patient has had a recent decline in their functional status and demonstrates the ability to make significant improvements in function in a reasonable and predictable amount of time.     Precautions / Restrictions Precautions Precautions: Fall Precaution Comments: watch HR and BP Restrictions Weight Bearing Restrictions: No      Mobility  Bed Mobility Overal bed mobility: Needs Assistance Bed Mobility: Supine to Sit, Sit to Supine     Supine to sit: Min guard Sit to supine: Min assist        Transfers Overall transfer level: Needs assistance Equipment used: Rolling walker (2 wheels), 1 person hand held assist Transfers: Sit to/from Stand Sit to Stand: Min guard, Min assist           General transfer comment: HR max 121 and then BP was 173/72    Ambulation/Gait Ambulation/Gait assistance: Min assist Gait Distance (Feet): 30 Feet Assistive device: Rolling walker (2 wheels), 1 person hand held assist Gait Pattern/deviations: Step-through pattern, Decreased stride length Gait velocity: reduced Gait velocity interpretation: <1.31 ft/sec, indicative of household ambulator Pre-gait activities: standing balance and vitals General Gait Details: carefully walked with pt to monitor for safety with weakness on LE's in appearance, giving out with tremors on RLE  Stairs            Wheelchair Mobility    Modified Rankin (Stroke Patients Only)       Balance Overall balance assessment: Needs assistance Sitting-balance support: Feet supported Sitting balance-Leahy Scale: Fair     Standing balance support: Bilateral upper extremity supported, During functional activity Standing balance-Leahy Scale: Poor Standing balance  comment: pt is not aware of her safety issues                             Pertinent  Vitals/Pain Pain Assessment Pain Assessment: Faces Pain Score: 0-No pain Faces Pain Scale: No hurt    Home Living Family/patient expects to be discharged to:: Private residence Living Arrangements: Spouse/significant other Available Help at Discharge: Family Type of Home: House Home Access: Stairs to enter Entrance Stairs-Rails: Psychiatric nurse of Steps: 4-5   Home Layout: Two level Home Equipment: Conservation officer, nature (2 wheels) Additional Comments: will need to verify home situation with family    Prior Function Prior Level of Function : Needs assist       Physical Assist : Mobility (physical) Mobility (physical): Gait   Mobility Comments: RW for gait without help per pt ADLs Comments: states independence but will need follow up     Hand Dominance        Extremity/Trunk Assessment   Upper Extremity Assessment Upper Extremity Assessment: Defer to OT evaluation RUE Coordination: decreased fine motor;decreased gross motor    Lower Extremity Assessment Lower Extremity Assessment: Generalized weakness    Cervical / Trunk Assessment Cervical / Trunk Assessment: Kyphotic  Communication   Communication: Expressive difficulties  Cognition Arousal/Alertness: Awake/alert Behavior During Therapy: Impulsive Overall Cognitive Status: Impaired/Different from baseline Area of Impairment: Problem solving, Awareness, Safety/judgement, Following commands, Memory, Attention                   Current Attention Level: Selective Memory: Decreased short-term memory, Decreased recall of precautions Following Commands: Follows one step commands with increased time Safety/Judgement: Decreased awareness of deficits, Decreased awareness of safety Awareness: Intellectual Problem Solving: Requires verbal cues, Requires tactile cues General Comments: confusing home information with pt reporting husband could not help her but then that they help each other         General Comments General comments (skin integrity, edema, etc.): pt is assisted to stand and take a few steps, with limited effort to control balance and generally weak    Exercises     Assessment/Plan    PT Assessment Patient needs continued PT services  PT Problem List Decreased strength;Decreased balance;Decreased activity tolerance;Decreased safety awareness;Cardiopulmonary status limiting activity       PT Treatment Interventions DME instruction;Gait training;Functional mobility training;Therapeutic activities;Therapeutic exercise;Balance training;Neuromuscular re-education;Stair training;Patient/family education    PT Goals (Current goals can be found in the Care Plan section)  Acute Rehab PT Goals Patient Stated Goal: none stated, confused PT Goal Formulation: With patient Time For Goal Achievement: 07/23/22 Potential to Achieve Goals: Good    Frequency Min 3X/week     Co-evaluation               AM-PAC PT "6 Clicks" Mobility  Outcome Measure Help needed turning from your back to your side while in a flat bed without using bedrails?: A Little Help needed moving from lying on your back to sitting on the side of a flat bed without using bedrails?: A Little Help needed moving to and from a bed to a chair (including a wheelchair)?: A Little Help needed standing up from a chair using your arms (e.g., wheelchair or bedside chair)?: A Little Help needed to walk in hospital room?: A Little Help needed climbing 3-5 steps with a railing? : A Lot 6 Click Score: 17    End of Session Equipment Utilized During Treatment: Gait belt  Activity Tolerance: Patient limited by fatigue;Treatment limited secondary to medical complications (Comment) Patient left: in bed;with call bell/phone within reach;with bed alarm set Nurse Communication: Mobility status PT Visit Diagnosis: Unsteadiness on feet (R26.81);Muscle weakness (generalized) (M62.81);Hemiplegia and hemiparesis;Adult,  failure to thrive (R62.7) Hemiplegia - Right/Left: Right Hemiplegia - dominant/non-dominant: Dominant    Time: 6060-0459 PT Time Calculation (min) (ACUTE ONLY): 28 min   Charges:   PT Evaluation $PT Eval Moderate Complexity: 1 Mod PT Treatments $Therapeutic Activity: 8-22 mins       Ramond Dial 07/09/2022, 3:38 PM  Mee Hives, PT PhD Acute Rehab Dept. Number: [place] [facility] [place]

## 2022-07-09 NOTE — Plan of Care (Signed)
  Problem: Activity: Goal: Risk for activity intolerance will decrease Outcome: Progressing   Problem: Nutrition: Goal: Adequate nutrition will be maintained Outcome: Progressing   Problem: Coping: Goal: Level of anxiety will decrease Outcome: Progressing   Problem: Elimination: Goal: Will not experience complications related to bowel motility Outcome: Progressing   

## 2022-07-10 ENCOUNTER — Inpatient Hospital Stay (HOSPITAL_COMMUNITY): Payer: Medicare Other

## 2022-07-10 DIAGNOSIS — N399 Disorder of urinary system, unspecified: Secondary | ICD-10-CM

## 2022-07-10 LAB — BASIC METABOLIC PANEL
Anion gap: 13 (ref 5–15)
BUN: 23 mg/dL (ref 8–23)
CO2: 20 mmol/L — ABNORMAL LOW (ref 22–32)
Calcium: 10 mg/dL (ref 8.9–10.3)
Chloride: 105 mmol/L (ref 98–111)
Creatinine, Ser: 0.95 mg/dL (ref 0.44–1.00)
GFR, Estimated: 60 mL/min — ABNORMAL LOW (ref >60)
Glucose, Bld: 109 mg/dL — ABNORMAL HIGH (ref 70–99)
Potassium: 4.4 mmol/L (ref 3.5–5.1)
Sodium: 138 mmol/L (ref 135–145)

## 2022-07-10 LAB — CBC WITH DIFFERENTIAL/PLATELET
Abs Immature Granulocytes: 0.13 10*3/uL — ABNORMAL HIGH (ref 0.00–0.07)
Basophils Absolute: 0 10*3/uL (ref 0.0–0.1)
Basophils Relative: 0 %
Eosinophils Absolute: 0 10*3/uL (ref 0.0–0.5)
Eosinophils Relative: 0 %
HCT: 43.5 % (ref 36.0–46.0)
Hemoglobin: 13.9 g/dL (ref 12.0–15.0)
Immature Granulocytes: 1 %
Lymphocytes Relative: 7 %
Lymphs Abs: 1.4 10*3/uL (ref 0.7–4.0)
MCH: 28.6 pg (ref 26.0–34.0)
MCHC: 32 g/dL (ref 30.0–36.0)
MCV: 89.5 fL (ref 80.0–100.0)
Monocytes Absolute: 1.2 10*3/uL — ABNORMAL HIGH (ref 0.1–1.0)
Monocytes Relative: 6 %
Neutro Abs: 18.3 10*3/uL — ABNORMAL HIGH (ref 1.7–7.7)
Neutrophils Relative %: 86 %
Platelets: 324 10*3/uL (ref 150–400)
RBC: 4.86 MIL/uL (ref 3.87–5.11)
RDW: 13.5 % (ref 11.5–15.5)
WBC: 21.1 10*3/uL — ABNORMAL HIGH (ref 4.0–10.5)
nRBC: 0 % (ref 0.0–0.2)

## 2022-07-10 LAB — URINALYSIS, ROUTINE W REFLEX MICROSCOPIC
Bilirubin Urine: NEGATIVE
Glucose, UA: NEGATIVE mg/dL
Hgb urine dipstick: NEGATIVE
Ketones, ur: 5 mg/dL — AB
Leukocytes,Ua: NEGATIVE
Nitrite: NEGATIVE
Protein, ur: 30 mg/dL — AB
Specific Gravity, Urine: 1.021 (ref 1.005–1.030)
pH: 6 (ref 5.0–8.0)

## 2022-07-10 LAB — COMPREHENSIVE METABOLIC PANEL
ALT: 14 U/L (ref 0–44)
AST: 24 U/L (ref 15–41)
Albumin: 4 g/dL (ref 3.5–5.0)
Alkaline Phosphatase: 54 U/L (ref 38–126)
Anion gap: 14 (ref 5–15)
BUN: 28 mg/dL — ABNORMAL HIGH (ref 8–23)
CO2: 20 mmol/L — ABNORMAL LOW (ref 22–32)
Calcium: 9.9 mg/dL (ref 8.9–10.3)
Chloride: 104 mmol/L (ref 98–111)
Creatinine, Ser: 1.03 mg/dL — ABNORMAL HIGH (ref 0.44–1.00)
GFR, Estimated: 54 mL/min — ABNORMAL LOW (ref >60)
Glucose, Bld: 151 mg/dL — ABNORMAL HIGH (ref 70–99)
Potassium: 4.1 mmol/L (ref 3.5–5.1)
Sodium: 138 mmol/L (ref 135–145)
Total Bilirubin: 1 mg/dL (ref 0.3–1.2)
Total Protein: 7.1 g/dL (ref 6.5–8.1)

## 2022-07-10 LAB — GLUCOSE, CAPILLARY
Glucose-Capillary: 114 mg/dL — ABNORMAL HIGH (ref 70–99)
Glucose-Capillary: 155 mg/dL — ABNORMAL HIGH (ref 70–99)

## 2022-07-10 LAB — MAGNESIUM: Magnesium: 2 mg/dL (ref 1.7–2.4)

## 2022-07-10 MED ORDER — HYDROCORTISONE (PERIANAL) 2.5 % EX CREA
TOPICAL_CREAM | Freq: Two times a day (BID) | CUTANEOUS | Status: DC | PRN
  Filled 2022-07-10: qty 28.4

## 2022-07-10 MED ORDER — METOPROLOL TARTRATE 12.5 MG HALF TABLET
12.5000 mg | ORAL_TABLET | Freq: Two times a day (BID) | ORAL | Status: DC
  Administered 2022-07-10 – 2022-07-16 (×11): 12.5 mg via ORAL
  Filled 2022-07-10 (×12): qty 1

## 2022-07-10 MED ORDER — PHENYLEPHRINE-MINERAL OIL-PET 0.25-14-74.9 % RE OINT
1.0000 | TOPICAL_OINTMENT | Freq: Two times a day (BID) | RECTAL | Status: DC | PRN
  Filled 2022-07-10: qty 57

## 2022-07-10 MED ORDER — HYDROCORTISONE (PERIANAL) 2.5 % EX CREA: TOPICAL_CREAM | Freq: Two times a day (BID) | CUTANEOUS | Status: DC | PRN

## 2022-07-10 NOTE — Progress Notes (Signed)
Patient seems to have a chang in her mental status from her baseline.Paged MD,took her down for CT scan.
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
## 2022-07-10 NOTE — Progress Notes (Signed)
HD#2 Subjective:   Summary: Catherine [name] is a 81 year old female with PMH of CAD, AS s/p TAVR, diabetes, CVA, HFpEF, hypothyroidism, RA, complete heart block with pacemaker in 2016 who presents after fall and noted facial droop, right-sided weakness and dysarthria, admitted for CVA work-up versus mechanical fall due to physical deconditioning.  Overnight Events: urinary retention this morning, in and out cath, urine culture obtained  She was feeling better this morning. Neck pain is left sided but improved. Reports suprapubic pain. Denies dysuria or frequency. She states she had history of cystitis and unsure if she was taking anything for it.    Objective:  Vital signs in last 24 hours: Vitals:   07/10/22 0405 07/10/22 0801 07/10/22 1257 07/10/22 1600  BP: 110/70 124/86 (!) 119/92 (!) 149/80  Pulse:  98 (!) 116 (!) 108  Resp:  '20 20 20  '$ Temp: 98.9 F (37.2 C) 98 F (36.7 C) 98.1 F (36.7 C)   TempSrc: Oral Oral Oral   SpO2:  98% 97% 97%   Supplemental O2: Room Air SpO2: 97 %   Physical Exam:  Constitutional: alert, in no acute distress HENT: normocephalic atraumatic Neck: right sided tenderness, normal ROM Cardiovascular: tachycardia, has pacemaker  Abdominal: tenderness to palpation in suprapubic region, non-distended, no guarding MSK: normal bulk and tone Neurological: alert & oriented x 3 Skin: warm and dry Psych: normal mood and behavior  There were no vitals filed for this visit.   Intake/Output Summary (Last 24 hours) at 07/10/2022 1722 Last data filed at 07/10/2022 0640 Gross per 24 hour  Intake --  Output 850 ml  Net -850 ml   Net IO Since Admission: -850 mL [07/10/22 1722]  Pertinent Labs:    Latest Ref Rng & Units 07/09/2022    8:26 AM 07/08/2022    9:22 AM 07/08/2022    9:20 AM  CBC  WBC 4.0 - 10.5 K/uL 10.4   10.2   Hemoglobin 12.0 - 15.0 g/dL 13.3  13.9  13.2   Hematocrit 36.0 - 46.0 % 40.8  41.0  41.6   Platelets 150 - 400 K/uL 254   270         Latest Ref Rng & Units 07/10/2022    5:01 AM 07/09/2022    8:26 AM 07/08/2022    9:22 AM  CMP  Glucose 70 - 99 mg/dL 109  111  120   BUN 8 - 23 mg/dL '23  11  17   '$ Creatinine 0.44 - 1.00 mg/dL 0.95  0.77  0.60   Sodium 135 - 145 mmol/L 138  137  137   Potassium 3.5 - 5.1 mmol/L 4.4  3.3  4.1   Chloride 98 - 111 mmol/L 105  103  102   CO2 22 - 32 mmol/L 20  21    Calcium 8.9 - 10.3 mg/dL 10.0  9.5      Imaging: No results found.  Assessment/Plan:   Principal Problem:   Stroke-like symptoms Active Problems:   Type II diabetes mellitus (HCC)   Essential hypertension   Hypothyroidism   Abdominal pain   Depression   CAD (coronary artery disease)   Patient Summary: Catherine [name] is a 81 y.o. with a pertinent PMH of CAD, AS s/p TAVR, diabetes, CVA, HFpEF, hypothyroidism, RA, complete heart block s/p pacemaker in 2016, who presented after fall and noted facial droop, right-sided weakness and dysarthria and admitted for CVA workup vs mechanical fall due to physical deconditioning.    Cystocerebral  Syndrome Patient had urinary retention this morning. In and out cath and obtained urine culture. Repeat bladder scans <200 ml. Her urinary retention could have caused her delirious state. Will continue to monitor for urinary retention.  -bladder scan every 8 hours -if she has urinary retention, >487m foley cath, 200-4063min and out, <20038monitor  Fall Strokelike symptoms Physical deconditioning CT head was negative.  Unable to obtain MRI due to incompatibility with pacemaker. Patient reports poor appetite and extensive chronic conditions leading to health decline.  A1c was 5.4%. Lipid panel significant for LDL 112. No focal neuro deficits. Dysarthria improved today. Neuro thinks possible TIA, will sign off for now. Will need neurology referral at discharge.  -continue aspirin 81 mg and plavix 75 mg for 3 weeks, then plavix alone -increase atorvastatin to 80 mg -start metoprolol 12.5 mg  twice daily -PT/OT/SLP consults -telemetry  Neck pain after fall X-ray of cervical spine showed multilevel degenerative changes. Neck pain is left-sided but improved and normal ROM.   Complete heart block s/p pacemaker 2016 Pacemaker model: Assurity DR model [id number] number [id number] interrogation in May 2023. Unable to obtain MRI due to incompatibility.   HTN CAD (70% LAD disease and mild RCA) Saw cardiology in May. Hold Imdur and losartan. -continue aspirin and atorvastatin  Hypothyroidism TSH low 0.048. Reduced levothyroxine to 100 mcg daily. Will need outpatient f/u to recheck TSH in 6 weeks.   Anxiety, Depression -continue home sertraline and trazodone   Diet:  Dysphagia 3 IVF: None,None VTE:  SCD Code: Full PT/OT recs:  SNF TOC recs: SNF   Dispo: Anticipated discharge to SNF pending medically stable.   MicAngelique BlonderO Internal Medicine Resident PGY-1 Please contact the on call pager after 5 pm and on weekends at [phone]

## 2022-07-10 NOTE — TOC Progression Note (Signed)
Transition of Care [facility]) - Progression Note    Patient Details  Name: Catherine [name] MRN: [id number] Date of Birth: 25-Oct-1941  Transition of Care [facility]) CM/SW [place], LCSW Phone Number: 07/10/2022, 2:17 PM  Clinical Narrative:    CSW received call from patient's spouse. CSW discussed SNF recommendation with him. He stated that he would rather bring patient home but will talk with his daughter to see if she thinks SNF would help patient.    Expected Discharge Plan: Skilled Nursing Facility Barriers to Discharge: Continued Medical Work up  Expected Discharge Plan and Services Expected Discharge Plan: [place]                                               Social Determinants of Health (SDOH) Interventions    Readmission Risk Interventions     No data to display

## 2022-07-10 NOTE — Progress Notes (Signed)
Patient may be down in imaging. I tried to contact the RN via phone but no answer. I am awaiting an answer through [employer].

## 2022-07-10 NOTE — Significant Event (Signed)
Paged by RN patient having worsening confusion. Presented to bedside where patient was aphasic, repeating the same phrase. BP normal, tachycardic with paced rhythm. When prompted with movements, able to follow some commands. Pupils equal round and reactive to light. Unable to perform further neurological exam with acute encephalopathy. With acute change, stat head CT, cbc, and cmp ordered. Glucose of 144. Appreciate rapid and nursing teams assistance. Spoke with Dr. Mike Craze who agrees with stat head CT and if negative, plan for EEG.
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
## 2022-07-10 NOTE — Progress Notes (Signed)
Pt has urine retention,did bladder scan.Paged the MD,did in and out cath.

## 2022-07-10 NOTE — Progress Notes (Signed)
EEG complete - results pending 

## 2022-07-10 NOTE — Progress Notes (Signed)
RE: Catherine [name] Date of Birth: 12/17/2040 Date: 07/10/22  Please be advised that the above-named patient will require a short-term nursing home stay - anticipated 30 days or less for rehabilitation and strengthening.  The plan is for return home.

## 2022-07-11 ENCOUNTER — Encounter: Payer: Medicare Other | Admitting: Cardiology

## 2022-07-11 ENCOUNTER — Inpatient Hospital Stay (HOSPITAL_COMMUNITY): Payer: Medicare Other

## 2022-07-11 ENCOUNTER — Encounter: Payer: Self-pay | Admitting: Cardiology

## 2022-07-11 DIAGNOSIS — R4182 Altered mental status, unspecified: Secondary | ICD-10-CM | POA: Diagnosis not present

## 2022-07-11 LAB — BASIC METABOLIC PANEL
Anion gap: 13 (ref 5–15)
BUN: 28 mg/dL — ABNORMAL HIGH (ref 8–23)
CO2: 22 mmol/L (ref 22–32)
Calcium: 9.9 mg/dL (ref 8.9–10.3)
Chloride: 105 mmol/L (ref 98–111)
Creatinine, Ser: 0.86 mg/dL (ref 0.44–1.00)
GFR, Estimated: >60 mL/min (ref >60)
Glucose, Bld: 127 mg/dL — ABNORMAL HIGH (ref 70–99)
Potassium: 4 mmol/L (ref 3.5–5.1)
Sodium: 140 mmol/L (ref 135–145)

## 2022-07-11 LAB — CBC WITH DIFFERENTIAL/PLATELET
Abs Immature Granulocytes: 0.08 10*3/uL — ABNORMAL HIGH (ref 0.00–0.07)
Basophils Absolute: 0 10*3/uL (ref 0.0–0.1)
Basophils Relative: 0 %
Eosinophils Absolute: 0 10*3/uL (ref 0.0–0.5)
Eosinophils Relative: 0 %
HCT: 39.7 % (ref 36.0–46.0)
Hemoglobin: 13 g/dL (ref 12.0–15.0)
Immature Granulocytes: 1 %
Lymphocytes Relative: 9 %
Lymphs Abs: 1.3 10*3/uL (ref 0.7–4.0)
MCH: 28.4 pg (ref 26.0–34.0)
MCHC: 32.7 g/dL (ref 30.0–36.0)
MCV: 86.9 fL (ref 80.0–100.0)
Monocytes Absolute: 1.2 10*3/uL — ABNORMAL HIGH (ref 0.1–1.0)
Monocytes Relative: 8 %
Neutro Abs: 12 10*3/uL — ABNORMAL HIGH (ref 1.7–7.7)
Neutrophils Relative %: 82 %
Platelets: 287 10*3/uL (ref 150–400)
RBC: 4.57 MIL/uL (ref 3.87–5.11)
RDW: 13.4 % (ref 11.5–15.5)
WBC: 14.6 10*3/uL — ABNORMAL HIGH (ref 4.0–10.5)
nRBC: 0 % (ref 0.0–0.2)

## 2022-07-11 LAB — CK: Total CK: 176 U/L (ref 38–234)

## 2022-07-11 LAB — GLUCOSE, CAPILLARY: Glucose-Capillary: 114 mg/dL — ABNORMAL HIGH (ref 70–99)

## 2022-07-11 LAB — AMMONIA: Ammonia: 16 umol/L (ref 9–35)

## 2022-07-11 MED ORDER — SODIUM CHLORIDE 0.9 % IV SOLN
1.0000 g | INTRAVENOUS | Status: DC
  Administered 2022-07-11: 1 g via INTRAVENOUS
  Filled 2022-07-11: qty 10

## 2022-07-11 MED ORDER — LIDOCAINE 5 % EX PTCH
1.0000 | MEDICATED_PATCH | CUTANEOUS | Status: DC
Start: 2022-07-11 — End: 2022-07-16
  Administered 2022-07-11 – 2022-07-16 (×6): 1 via TRANSDERMAL
  Filled 2022-07-11 (×6): qty 1

## 2022-07-11 NOTE — Progress Notes (Signed)
PT Cancellation Note  Patient Details Name: Catherine [name] MRN: [id number] DOB: 1941-11-06   Cancelled Treatment:    Reason Eval/Treat Not Completed: Patient at procedure or test/unavailable   Arby Barrette, PT Acute Rehabilitation Services  Office [phone]  Rexanne Mano 07/11/2022, 11:08 AM
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
## 2022-07-11 NOTE — Progress Notes (Signed)
Rapid Response Event Note   Reason for Call : AMS    Initial Focused Assessment:  PT alert disoriented x 4 unable to f/c only repeating words "yes ma'am" able to move all extremities. MD at bedside ordering a Stat head CT. BP 124/91, HR 108 paced rhythm, RR 20, SpO2 90% RA, T 98.1 O. Pt transported to CT where she became more interactive but still disoriented, complains of pain in the back of her head.      Plan of Care:   Unable to perform MRI d/t pacemaker, CT completed, MD ordered EEG. Continue to monitor pt and vitals. Call RRT if further assistance is needed.     MD Notified: PTA RRT Call Time:1951 Arrival Time: 2000   Devota Pace, RN

## 2022-07-11 NOTE — Care Management Important Message (Signed)
Important Message  Patient Details  Name: Catherine [name] MRN: [id number] Date of Birth: 1941-09-07   Medicare Important Message Given:  Yes     Orbie Pyo 07/11/2022, 12:35 PM

## 2022-07-11 NOTE — Progress Notes (Signed)
Patient still seems confused,do follow the simple command.

## 2022-07-11 NOTE — Progress Notes (Signed)
RN did a swallow eval  before giving PO  meds crushed in apple sauce.

## 2022-07-11 NOTE — TOC Progression Note (Addendum)
Transition of Care [facility]) - Progression Note    Patient Details  Name: Catherine [name] MRN: [id number] Date of Birth: October 08, 1941  Transition of Care [facility] CM/SW [place], LCSW Phone Number: 07/11/2022, 9:15 AM  Clinical Narrative:    9:15am-CSW had missed calls from patient's spouse so CSW called him back but he stated he thought he had missed a call from [place]. CSW provided supportive listening as he spoke about the medical events of last night and how he wished her had come earlier to the hospital yesterday.   3pm-CSW met with spouse and daughter at bedside and provided SNF offers for them to review as patient progresses medically.    Expected Discharge Plan: Skilled Nursing Facility Barriers to Discharge: Continued Medical Work up  Expected Discharge Plan and Services Expected Discharge Plan: [place]                                               Social Determinants of Health (SDOH) Interventions    Readmission Risk Interventions     No data to display

## 2022-07-11 NOTE — Procedures (Signed)
Patient Name: Catherine [name]  MRN: [id number]  Epilepsy Attending: Lora Havens  Referring Physician/Provider: Linus Galas, MD  Date: 07/11/2022 Duration: 22.38 mins  Patient history: 81 year old female with altered mental status.  EEG to evaluate for seizure.  Level of alertness: Awake  AEDs during EEG study: None  Technical aspects: This EEG study was done with scalp electrodes positioned according to the 10-20 International system of electrode placement. Electrical activity was reviewed with band pass filter of 1-'70Hz'$ , sensitivity of 7 uV/mm, display speed of 23m/sec with a '60Hz'$  notched filter applied as appropriate. EEG data were recorded continuously and digitally stored.  Video monitoring was available and reviewed as appropriate.  Description: The posterior dominant rhythm consists of 9 Hz activity of moderate voltage (25-35 uV) seen predominantly in posterior head regions, symmetric and reactive to eye opening and eye closing. Hyperventilation and photic stimulation were not performed.     IMPRESSION: This study is within normal limits. No seizures or epileptiform discharges were seen throughout the recording.  A normal interictal EEG does not exclude nor support the diagnosis of epilepsy.   Catherine [name]

## 2022-07-11 NOTE — Progress Notes (Signed)
Occupational Therapy Treatment Patient Details Name: Catherine [name] MRN: [id number] DOB: May 09, 1941 Today's Date: 07/11/2022   History of present illness Patient is a 81 yo female admitted to ED on 07/08/22 with stroke like symptoms. CT negative, no emergent finding with CTA and CTP.  Patient does have extensive intracranial atherosclerosis with bilateral high-grade PCA stenosis and moderate right M1 stenosis.[facility] MRI unable to be performed due to PPM. PMH includes: CAD, CHF, RA, DM2, HTN and heart block with PPM   OT comments  Patient with functional decline since evaluation on 07/09/22. Patient can only state "yes ma'am, im going to be ok, you're so kind" when prompted with any sort of question, increased fear of falling noted in session, and only oriented to self. Patient mod to max A for bed mobility and transfers, with significant need for assist to sequence stand pivot transfer to chair. Of note, patient was able to ambulate with PT on evaluation on 07/09/22. Patient unable to clear bolus when provided food, now pocketing food despite cues (SLP notified of concerns). OT able to speak to neurologist on case at end of OT session to express functional decline. OT continuing to endorse AIR placement due to increase in functional deficits. OT will continue to follow acutely.    Recommendations for follow up therapy are one component of a multi-disciplinary discharge planning process, led by the attending physician.  Recommendations may be updated based on patient status, additional functional criteria and insurance authorization.    Follow Up Recommendations  Acute inpatient rehab (3hours/day)    Assistance Recommended at Discharge Intermittent Supervision/Assistance  Patient can return home with the following  A lot of help with walking and/or transfers;A lot of help with bathing/dressing/bathroom;Assistance with cooking/housework;Direct supervision/assist for medications management;Direct  supervision/assist for financial management;Assist for transportation;Help with stairs or ramp for entrance   Equipment Recommendations  Other (comment) (Will continue to assess)    Recommendations for Other Services      Precautions / Restrictions Precautions Precautions: Fall Precaution Comments: watch HR and BP Restrictions Weight Bearing Restrictions: No       Mobility Bed Mobility Overal bed mobility: Needs Assistance Bed Mobility: Supine to Sit     Supine to sit: Max assist     General bed mobility comments: increased assist needed at trunk and BLEs to advance to sitting EOB, significant difficulty noted to sequence movements    Transfers Overall transfer level: Needs assistance Equipment used: 1 person hand held assist Transfers: Bed to chair/wheelchair/BSC Sit to Stand: Mod assist, Max assist Stand pivot transfers: Max assist, Mod assist         General transfer comment: Face to face transfer completed to chair, posterior lean throughout, significant difficulty sequencing steps with NT providing assist to advance gait safely to chair     Balance Overall balance assessment: Needs assistance Sitting-balance support: Feet supported Sitting balance-Leahy Scale: Poor     Standing balance support: Bilateral upper extremity supported, During functional activity Standing balance-Leahy Scale: Poor Standing balance comment: posterior lean in standing, patient unable to correct independently                           ADL either performed or assessed with clinical judgement   ADL Overall ADL's : Needs assistance/impaired             Lower Body Bathing: Maximal assistance;Bed level   Upper Body Dressing : Moderate assistance;Bed level  Toilet Transfer: Cueing for safety;Cueing for sequencing;Moderate assistance;Stand-pivot Toilet Transfer Details (indicate cue type and reason): face to face transfer completed to chair, patient with  posterior lean and anxious throughout, significant difficulty sequencing         Functional mobility during ADLs: Moderate assistance;Maximal assistance;Cueing for safety;Cueing for sequencing General ADL Comments: patient with functional decline in session, decreased awareness, worsening cognition, and need for increased assist    Extremity/Trunk Assessment              Vision       Perception     Praxis      Cognition Arousal/Alertness: Awake/alert Behavior During Therapy: Anxious Overall Cognitive Status: Impaired/Different from baseline Area of Impairment: Problem solving, Awareness, Safety/judgement, Following commands, Memory, Attention, Orientation                 Orientation Level: Place, Time, Situation Current Attention Level: Focused Memory: Decreased short-term memory, Decreased recall of precautions Following Commands: Follows one step commands with increased time Safety/Judgement: Decreased awareness of deficits, Decreased awareness of safety Awareness: Intellectual Problem Solving: Requires verbal cues, Requires tactile cues, Decreased initiation, Difficulty sequencing, Slow processing General Comments: patient with functional decline since evaluation, patient can only state "yes ma'am, im going to be ok, you're so kind" when prompted with any sort of question, increased fear of falling noted in session, and only oriented to self        Exercises      Shoulder Instructions       General Comments      Pertinent Vitals/ Pain       Pain Assessment Pain Assessment: Faces Faces Pain Scale: Hurts little more Pain Intervention(s): Limited activity within patient's tolerance, Monitored during session, Repositioned  Home Living                                          Prior Functioning/Environment              Frequency  Min 2X/week        Progress Toward Goals  OT Goals(current goals can now be found in the care  plan section)  Progress towards OT goals: Not progressing toward goals - comment  Acute Rehab OT Goals Patient Stated Goal: patient with functional decline, MD made aware OT Goal Formulation: Patient unable to participate in goal setting Time For Goal Achievement: 07/23/22 Potential to Achieve Goals: [place] Discharge plan remains appropriate    Co-evaluation                 AM-PAC OT "6 Clicks" Daily Activity     Outcome Measure   Help from another person eating meals?: A Lot Help from another person taking care of personal grooming?: A Lot Help from another person toileting, which includes using toliet, bedpan, or urinal?: A Lot Help from another person bathing (including washing, rinsing, drying)?: A Lot Help from another person to put on and taking off regular upper body clothing?: A Lot Help from another person to put on and taking off regular lower body clothing?: A Lot 6 Click Score: 12    End of Session Equipment Utilized During Treatment: Gait belt  OT Visit Diagnosis: Unsteadiness on feet (R26.81);Muscle weakness (generalized) (M62.81);History of falling (Z91.81);Other symptoms and signs involving the nervous system (R29.898);Other symptoms and signs involving cognitive function   Activity Tolerance Patient limited by fatigue  Patient Left with call bell/phone within reach;with nursing/sitter in room;with chair alarm set;in chair   Nurse Communication Mobility status        Time: 7282-0601 OT Time Calculation (min): 20 min  Charges: OT General Charges $OT Visit: 1 Visit OT Treatments $Self Care/Home Management : 8-22 mins  Corinne Ports E. Linc Renne, OTR/L Acute Rehabilitation Services [phone]   Ascencion Dike 07/11/2022, 2:09 PM

## 2022-07-11 NOTE — Progress Notes (Signed)
HD#3 Subjective:   Summary: Ms. Stahlman is a 81 year old female with PMH of CAD, AS s/p TAVR, diabetes, CVA, HFpEF, hypothyroidism, RA, complete heart block with pacemaker in 2016 who presents after fall and noted facial droop, right-sided weakness and dysarthria, admitted for CVA work-up versus mechanical fall due to physical deconditioning.  Overnight Events: confused overnight, CT Head no acute abnormality, EEG, leukocytosis   Ms. Mysliwiec appeared more confused today. Alert and oriented x1. She has more trouble finding her words. Left side neck pain, normal ROM without pain.    Objective:  Vital signs in last 24 hours: Vitals:   07/11/22 0824 07/11/22 0827 07/11/22 0900 07/11/22 1000  BP:  131/71 139/68 130/79  Pulse: (!) 115 (!) 112 95 75  Resp: (!) 23 18 (!) 25 (!) 23  Temp:      TempSrc:      SpO2: 98%  98% 97%   Supplemental O2: Room Air SpO2: 97 %   Physical Exam:  Constitutional: alert, in no acute distress HENT: normocephalic atraumatic Neck: left side tenderness, normal ROM Cardiovascular: normal rate and rhythm, has pacemaker  MSK: normal bulk and tone Neurological: alert & oriented x1 Skin: warm and dry Psych: normal mood and behavior  There were no vitals filed for this visit.   Intake/Output Summary (Last 24 hours) at 07/11/2022 1154 Last data filed at 07/11/2022 0659 Gross per 24 hour  Intake --  Output 400 ml  Net -400 ml    Net IO Since Admission: -1,250 mL [07/11/22 1154]  Pertinent Labs:    Latest Ref Rng & Units 07/11/2022    9:24 AM 07/10/2022    9:05 PM 07/09/2022    8:26 AM  CBC  WBC 4.0 - 10.5 K/uL 14.6  21.1  10.4   Hemoglobin 12.0 - 15.0 g/dL 13.0  13.9  13.3   Hematocrit 36.0 - 46.0 % 39.7  43.5  40.8   Platelets 150 - 400 K/uL 287  324  254        Latest Ref Rng & Units 07/11/2022   12:49 AM 07/10/2022    9:05 PM 07/10/2022    5:01 AM  CMP  Glucose 70 - 99 mg/dL 127  151  109   BUN 8 - 23 mg/dL '28  28  23   '$ Creatinine 0.44 - 1.00  mg/dL 0.86  1.03  0.95   Sodium 135 - 145 mmol/L 140  138  138   Potassium 3.5 - 5.1 mmol/L 4.0  4.1  4.4   Chloride 98 - 111 mmol/L 105  104  105   CO2 22 - 32 mmol/L '22  20  20   '$ Calcium 8.9 - 10.3 mg/dL 9.9  9.9  10.0   Total Protein 6.5 - 8.1 g/dL  7.1    Total Bilirubin 0.3 - 1.2 mg/dL  1.0    Alkaline Phos 38 - 126 U/L  54    AST 15 - 41 U/L  24    ALT 0 - 44 U/L  14      Imaging: DG CHEST PORT 1 VIEW  Result Date: 07/11/2022 CLINICAL DATA:  Altered mental status. EXAM: PORTABLE CHEST 1 VIEW COMPARISON:  June 11, 2022. FINDINGS: The heart size and mediastinal contours are within normal limits. Both lungs are clear. Right-sided pacemaker is unchanged in position. The visualized skeletal structures are unremarkable. IMPRESSION: No active disease. Electronically Signed   By: Marijo Conception M.D.   On: 07/11/2022 11:13  EEG adult  Result Date: 07/11/2022 Lora Havens, MD     07/11/2022  8:48 AM Patient Name: TANIJAH MORAIS MRN: [id number] Epilepsy Attending: Lora Havens Referring Physician/Provider: Linus Galas, MD Date: 07/11/2022 Duration: 22.38 mins Patient history: 81 year old female with altered mental status.  EEG to evaluate for seizure. Level of alertness: Awake AEDs during EEG study: None Technical aspects: This EEG study was done with scalp electrodes positioned according to the 10-20 International system of electrode placement. Electrical activity was reviewed with band pass filter of 1-'70Hz'$ , sensitivity of 7 uV/mm, display speed of 15m/sec with a '60Hz'$  notched filter applied as appropriate. EEG data were recorded continuously and digitally stored.  Video monitoring was available and reviewed as appropriate. Description: The posterior dominant rhythm consists of 9 Hz activity of moderate voltage (25-35 uV) seen predominantly in posterior head regions, symmetric and reactive to eye opening and eye closing. Hyperventilation and photic stimulation were not performed.    IMPRESSION: This study is within normal limits. No seizures or epileptiform discharges were seen throughout the recording. A normal interictal EEG does not exclude nor support the diagnosis of epilepsy. PLora Havens  CT HEAD CODE STROKE WO CONTRAST  Result Date: 07/10/2022 CLINICAL DATA:  Code stroke. Initial evaluation for neuro deficit, stroke suspected. EXAM: CT HEAD WITHOUT CONTRAST TECHNIQUE: Contiguous axial images were obtained from the base of the skull through the vertex without intravenous contrast. RADIATION DOSE REDUCTION: This exam was performed according to the departmental dose-optimization program which includes automated exposure control, adjustment of the mA and/or kV according to patient size and/or use of iterative reconstruction technique. COMPARISON:  CT from 07/09/2022. FINDINGS: Brain: Generalized age-related cerebral atrophy. No acute intracranial hemorrhage. No acute large vessel territory infarct. No mass lesion or midline shift. No hydrocephalus or extra-axial fluid collection. Vascular: No abnormal hyperdense vessel. Skull: Scalp soft tissues and calvarium demonstrate no acute finding. Sinuses/Orbits: Globes orbital soft tissues within normal limits. Paranasal sinuses and mastoid air cells are clear. Other: None. [facility]S [facility] - Ganglionic level infarction (caudate, lentiform nuclei, internal capsule, insula, M1-M3 cortex): 7 - Supraganglionic infarction (M4-M6 cortex): 3 Total score (0-10 with 10 being normal): 10 IMPRESSION: 1. No acute intracranial abnormality. 2. ASPECTS is 10. These results were communicated to Dr. ARory Percyat 8:35 pm on 07/10/2022 by text page via the [facility] system. Electronically Signed   By: BJeannine BogaM.D.   On: 07/10/2022 20:35    Assessment/Plan:   Principal Problem:   Stroke-like symptoms Active Problems:   Type II diabetes mellitus (HCC)   Essential hypertension   Hypothyroidism   Abdominal  pain   Depression   CAD (coronary artery disease)   Patient Summary: Catherine GOSSETTis a 81y.o. with a pertinent PMH of CAD, AS s/p TAVR, diabetes, CVA, HFpEF, hypothyroidism, RA, complete heart block s/p pacemaker in 2016, who presented after fall and noted facial droop, right-sided weakness and dysarthria and admitted for CVA workup vs mechanical fall due to physical deconditioning.   Cystocerebral Syndrome Patient continues to have urinary retention. Scheduled in and out cath and bladder scans. Urine culture grew Klebsiella pneumoniae 20,000 colonies. Her urinary retention could have caused her delirious state. Will continue to monitor for urinary retention.  -start ceftriaxone, day 1 -bladder scan every 8 hours -in and out cath every 8 hours -if she continues to have urinary retention, consider foley cath  Hospital delirium History of Fall Strokelike symptoms Physical deconditioning  CT head was negative.  Unable to obtain MRI due to incompatibility with pacemaker. Patient reports poor appetite and extensive chronic conditions leading to health decline.  A1c was 5.4%. Lipid panel significant for LDL 112. No focal neuro deficits. Dysarthria worse today. Neuro thinks more encephalopathy than CVA. CK normal. Ammonia normal.  -continue aspirin 81 mg and plavix 75 mg for 3 weeks, then plavix alone -continue atorvastatin to 80 mg -continue metoprolol 12.5 mg twice daily -SLP recommends Dysphagia 1 diet -PT/OT -telemetry  Neck pain after fall X-ray of cervical spine showed multilevel degenerative changes. Neck pain is left-sided but improved and normal ROM.  -lidocaine patch   Complete heart block s/p pacemaker 2016 Pacemaker model: Assurity DR model IH0388 (serial number [id number]) Last interrogation in May 2023. Unable to obtain MRI due to incompatibility.   HTN CAD (70% LAD disease and mild RCA) Saw cardiology in May. Hold Imdur and losartan. -continue aspirin and  atorvastatin  Hypothyroidism TSH low 0.048. Reduced levothyroxine to 100 mcg daily. Will need outpatient f/u to recheck TSH in 6 weeks.   Anxiety, Depression -continue home sertraline and trazodone   Diet:  Dysphagia 1 IVF: None,None VTE:  SCD Code: Full PT/OT recs:  SNF TOC recs: SNF   Dispo: Anticipated discharge to SNF pending medically stable.   Angelique Blonder, DO Internal Medicine Resident PGY-1 Please contact the on call pager after 5 pm and on weekends at [phone].

## 2022-07-11 NOTE — Progress Notes (Addendum)
STROKE TEAM PROGRESS NOTE   INTERVAL HISTORY Overnight patient with worsening confusion and aphasia. Tachycardic. CT head unremarkable. Patient unable to undergo MRI due to pacemaker. EEG unremarkable.   Patient seen at chair bedside. Patient is intermittently following simple commands. Patient can say "yes" and "thank you." No focal neurologic deficit noted on exam besides new aphasia.   Vitals:   07/11/22 0824 07/11/22 0827 07/11/22 0900 07/11/22 1000  BP:  131/71 139/68 130/79  Pulse: (!) 115 (!) 112 95 75  Resp: (!) 23 18 (!) 25 (!) 23  Temp:      TempSrc:      SpO2: 98%  98% 97%   CBC:  Recent Labs  Lab 07/10/22 2105 07/11/22 0924  WBC 21.1* 14.6*  NEUTROABS 18.3* 12.0*  HGB 13.9 13.0  HCT 43.5 39.7  MCV 89.5 86.9  PLT 324 938   Basic Metabolic Panel:  Recent Labs  Lab 07/10/22 0501 07/10/22 2105 07/11/22 0049  NA 138 138 140  K 4.4 4.1 4.0  CL 105 104 105  CO2 20* 20* 22  GLUCOSE 109* 151* 127*  BUN 23 28* 28*  CREATININE 0.95 1.03* 0.86  CALCIUM 10.0 9.9 9.9  MG 2.0  --   --    Lipid Panel:  Recent Labs  Lab 07/08/22 1211  CHOL 180  TRIG 105  HDL 47  CHOLHDL 3.8  VLDL 21  LDLCALC 112*   HgbA1c:  Recent Labs  Lab 07/08/22 1211  HGBA1C 5.4   Urine Drug Screen: No results for input(s): "LABOPIA", "COCAINSCRNUR", "LABBENZ", "AMPHETMU", "THCU", "LABBARB" in the last 168 hours.  Alcohol Level  Recent Labs  Lab 07/08/22 0920  ETH <10    IMAGING past 24 hours DG CHEST PORT 1 VIEW  Result Date: 07/11/2022 CLINICAL DATA:  Altered mental status. EXAM: PORTABLE CHEST 1 VIEW COMPARISON:  June 11, 2022. FINDINGS: The heart size and mediastinal contours are within normal limits. Both lungs are clear. Right-sided pacemaker is unchanged in position. The visualized skeletal structures are unremarkable. IMPRESSION: No active disease. Electronically Signed   By: Marijo Conception M.D.   On: 07/11/2022 11:13   EEG adult  Result Date: 07/11/2022 Lora Havens, MD     07/11/2022  8:48 AM Patient Name: Catherine [name] MRN: [id number] Epilepsy Attending: Lora Havens Referring Physician/Provider: Linus Galas, MD Date: 07/11/2022 Duration: 22.38 mins Patient history: 81 year old female with altered mental status.  EEG to evaluate for seizure. Level of alertness: Awake AEDs during EEG study: None Technical aspects: This EEG study was done with scalp electrodes positioned according to the 10-20 International system of electrode placement. Electrical activity was reviewed with band pass filter of 1-'70Hz'$ , sensitivity of 7 uV/mm, display speed of 42m/sec with a '60Hz'$  notched filter applied as appropriate. EEG data were recorded continuously and digitally stored.  Video monitoring was available and reviewed as appropriate. Description: The posterior dominant rhythm consists of 9 Hz activity of moderate voltage (25-35 uV) seen predominantly in posterior head regions, symmetric and reactive to eye opening and eye closing. Hyperventilation and photic stimulation were not performed.   IMPRESSION: This study is within normal limits. No seizures or epileptiform discharges were seen throughout the recording. A normal interictal EEG does not exclude nor support the diagnosis of epilepsy. PLora Havens  CT HEAD CODE STROKE WO CONTRAST  Result Date: 07/10/2022 CLINICAL DATA:  Code stroke. Initial evaluation for neuro deficit, stroke suspected. EXAM: CT HEAD WITHOUT CONTRAST TECHNIQUE: Contiguous axial  images were obtained from the base of the skull through the vertex without intravenous contrast. RADIATION DOSE REDUCTION: This exam was performed according to the departmental dose-optimization program which includes automated exposure control, adjustment of the mA and/or kV according to patient size and/or use of iterative reconstruction technique. COMPARISON:  CT from 07/09/2022. FINDINGS: Brain: Generalized age-related cerebral atrophy. No acute intracranial  hemorrhage. No acute large vessel territory infarct. No mass lesion or midline shift. No hydrocephalus or extra-axial fluid collection. Vascular: No abnormal hyperdense vessel. Skull: Scalp soft tissues and calvarium demonstrate no acute finding. Sinuses/Orbits: Globes orbital soft tissues within normal limits. Paranasal sinuses and mastoid air cells are clear. Other: None. ASPECTS [facility] Stroke Program Early CT Score) - Ganglionic level infarction (caudate, lentiform nuclei, internal capsule, insula, M1-M3 cortex): 7 - Supraganglionic infarction (M4-M6 cortex): 3 Total score (0-10 with 10 being normal): 10 IMPRESSION: 1. No acute intracranial abnormality. 2. ASPECTS is 10. These results were communicated to Dr. Rory Percy at 8:35 pm on 07/10/2022 by text page via the [facility] messaging system. Electronically Signed   By: Jeannine Boga M.D.   On: 07/10/2022 20:35    PHYSICAL EXAM GENERAL: Awake, alert, in no acute distress Psych: Patient is calm and cooperative with examination Head: Normocephalic and atraumatic, without obvious abnormality LUNGS: Normal respiratory effort. Non-labored breathing on room air CV: Tachycardic on telemetry   NEURO:  Mental Status: Intermittently following commands. Able to say name, "yes", "thank you". Not oriented to time, place, situation. Cranial Nerves:  V: Blinks to threat bilaterally.  VII: Slight asymmetry with R lip, does not appear to be consistent with facial droop.  Motor: Upper and lower extremities with antigravity strength. Symmetric bilaterally.  Coordination: RUE intention tremor is present Gait: Deferred for patient safety  ASSESSMENT/PLAN 81 y.o. with PMHx significant for HTN, CAD, DM2, chronic diastolic CHF, RA, hypothyroidism, complete heart block s/p PPM (not MRI compatible)  who presented to the ED on 8/6 after sustaining a mechanical fall from standing at home with right-sided weakness, right facial droop, and slurred speech.  Patient complains  of neck and head pain on arrival. Initial and repeat CT imaging of head are negative for stroke. CTA head and neck with extensive intracranial atherosclerosis. BLE doppler negative. April 2023 Echo shows EF 55%. LDL 112. Hgb AIC 5.4. Patient with new-onset aphasia. Repeat CT head and EEG unremarkable. No focal deficits on exam besides new onset aphasia along with tachycardia and new leukocytosis is more suspicious for acute toxic-metabolic encephalopathy vs. new stroke.   New confusion, speech difficulty - likely encephalopathy, but can not rule out stroke 8/8 functional decline over night Lack of focal neuro deficit but mental status decline with speech dysfunction Repeat CT head 8/8 No acute intracranial abnormality Overnight EEG unremarkable  UA negative Pt does have tachycardia and leukocytosis Infectious and toxic-metabolic w/u including: Ammonia, blood cultures, CXR   Episode of right-sided weakness, right facial droop, and slurred speech - possible TIA in setting of extensive intracranial atherosclerosis.  Code Stroke CT head 8/6 Negative for hemorrhage or visible infarct. Left scalp swelling. Repeat CT head 8/7 No evidence of acute intracranial abnormality. CTA head & neck Atherosclerosis in the neck causes proximal ICA stenosis of 65% on the right and 40% on the left. Extensive intracranial atherosclerosis most notably causing bilateral high-grade PCA stenosis and moderate right M1 stenosis. MRI: unable to obtain due to incompatibility with pacemaker BLE Doppler no DVT 2D Echo (April 2023) EF 55% with no shunt or thrombus  detected  Pacemaker interrogation showed atrial arrhythmia but no A-fib or a flutter. LDL 112 HgbA1c 5.4 VTE prophylaxis -SCDs On ASA 81 PTA, now on DAPT with ASA '81mg'$  and Plavix '75mg'$  x 21 days then plavix alone as monotherapy for life  Therapy recommendations:  CIR Disposition:  TBD  Hypertension Stable Long-term BP goal normotensive  Hyperlipidemia Home  meds:  Lipitor '40mg'$  LDL 112, not at goal < 70 Recommended increase dose of Lipitor from '40mg'$  to '80mg'$   Continue Lipitor '80mg'$  at discharge  Other Stroke Risk Factors Advanced Age >/= 73  Family hx stroke (sister) CAD  Other Active Problems S/p mechanical fall  Heart block with pacemaker RA  Hospital day # 3  This patient was seen and evaluated with Dr. Erlinda Hong.  He directed the plan of care.  Please see AMION for Stroke team contact information.  Rolanda Lundborg, MD, PGY-1  07/11/2022 11:37 AM  ATTENDING NOTE: I reviewed above note and agree with the assessment and plan. Pt was seen and examined.   No family at bedside.  Patient has functional decline overnight, reported aphasia, perseveration, tachycardia.  CBC showed leukocytosis overnight.  Repeat CT no acute abnormality.  This morning on exam, patient sitting in chair for breakfast, eyes open, not follow commands, answer with "yes, sir" to most questions, but able to say " thank you" " okay".  Last night patient also intermittently follow commands.  No other focal deficit.  Patient found to have tachycardia and continued leukocytosis.  UA negative.  Concerning for encephalopathy more likely although new stroke cannot be ruled out.  Not able to have MRI due to pacemaker.  Recommend continue DAPT.  PT/OT.  Not sure if medication related.  Patient Lipitor was increased from 40-80.  If symptoms not improving and workup negative, may consider decrease Lipitor for trial.  Can hold off trazodone and Zoloft if needed for observation.  Will follow.  For detailed assessment and plan, please refer to above/below as I have made changes wherever appropriate.   Rosalin Hawking, MD PhD Stroke Neurology 07/11/2022 4:26 PM    To contact Stroke Continuity provider, please refer to [email or web address] After hours, contact General Neurology

## 2022-07-12 DIAGNOSIS — R251 Tremor, unspecified: Secondary | ICD-10-CM

## 2022-07-12 DIAGNOSIS — M6289 Other specified disorders of muscle: Secondary | ICD-10-CM

## 2022-07-12 DIAGNOSIS — R410 Disorientation, unspecified: Secondary | ICD-10-CM

## 2022-07-12 LAB — BASIC METABOLIC PANEL
Anion gap: 11 (ref 5–15)
Anion gap: 9 (ref 5–15)
BUN: 29 mg/dL — ABNORMAL HIGH (ref 8–23)
BUN: 29 mg/dL — ABNORMAL HIGH (ref 8–23)
CO2: 23 mmol/L (ref 22–32)
CO2: 24 mmol/L (ref 22–32)
Calcium: 9.6 mg/dL (ref 8.9–10.3)
Calcium: 9.3 mg/dL (ref 8.9–10.3)
Chloride: 102 mmol/L (ref 98–111)
Chloride: 102 mmol/L (ref 98–111)
Creatinine, Ser: 0.69 mg/dL (ref 0.44–1.00)
Creatinine, Ser: 0.72 mg/dL (ref 0.44–1.00)
GFR, Estimated: >60 mL/min (ref >60)
GFR, Estimated: >60 mL/min (ref >60)
Glucose, Bld: 113 mg/dL — ABNORMAL HIGH (ref 70–99)
Glucose, Bld: 154 mg/dL — ABNORMAL HIGH (ref 70–99)
Potassium: 3.3 mmol/L — ABNORMAL LOW (ref 3.5–5.1)
Potassium: 3.8 mmol/L (ref 3.5–5.1)
Sodium: 136 mmol/L (ref 135–145)
Sodium: 135 mmol/L (ref 135–145)

## 2022-07-12 LAB — CBC WITH DIFFERENTIAL/PLATELET
Abs Immature Granulocytes: 0.06 10*3/uL (ref 0.00–0.07)
Basophils Absolute: 0 10*3/uL (ref 0.0–0.1)
Basophils Relative: 0 %
Eosinophils Absolute: 0.1 10*3/uL (ref 0.0–0.5)
Eosinophils Relative: 0 %
HCT: 39.6 % (ref 36.0–46.0)
Hemoglobin: 12.9 g/dL (ref 12.0–15.0)
Immature Granulocytes: 1 %
Lymphocytes Relative: 16 %
Lymphs Abs: 2.1 10*3/uL (ref 0.7–4.0)
MCH: 28.6 pg (ref 26.0–34.0)
MCHC: 32.6 g/dL (ref 30.0–36.0)
MCV: 87.8 fL (ref 80.0–100.0)
Monocytes Absolute: 1.4 10*3/uL — ABNORMAL HIGH (ref 0.1–1.0)
Monocytes Relative: 11 %
Neutro Abs: 9.2 10*3/uL — ABNORMAL HIGH (ref 1.7–7.7)
Neutrophils Relative %: 72 %
Platelets: 307 10*3/uL (ref 150–400)
RBC: 4.51 MIL/uL (ref 3.87–5.11)
RDW: 13.2 % (ref 11.5–15.5)
WBC: 12.8 10*3/uL — ABNORMAL HIGH (ref 4.0–10.5)
nRBC: 0 % (ref 0.0–0.2)

## 2022-07-12 LAB — MAGNESIUM: Magnesium: 2.1 mg/dL (ref 1.7–2.4)

## 2022-07-12 LAB — GLUCOSE, CAPILLARY: Glucose-Capillary: 111 mg/dL — ABNORMAL HIGH (ref 70–99)

## 2022-07-12 MED ORDER — LOSARTAN POTASSIUM 25 MG PO TABS
12.5000 mg | ORAL_TABLET | Freq: Every day | ORAL | Status: DC
  Administered 2022-07-12 – 2022-07-16 (×5): 12.5 mg via ORAL
  Filled 2022-07-12 (×6): qty 0.5

## 2022-07-12 MED ORDER — LORAZEPAM 2 MG/ML IJ SOLN
0.5000 mg | Freq: Once | INTRAMUSCULAR | Status: DC
Start: 2022-07-12 — End: 2022-07-12
  Filled 2022-07-12: qty 1

## 2022-07-12 MED ORDER — CLONAZEPAM 0.5 MG PO TABS
0.5000 mg | ORAL_TABLET | Freq: Every day | ORAL | Status: DC
  Administered 2022-07-13: 0.5 mg via ORAL
  Filled 2022-07-12: qty 1

## 2022-07-12 MED ORDER — LORAZEPAM 0.5 MG PO TABS
0.5000 mg | ORAL_TABLET | Freq: Once | ORAL | Status: AC
  Administered 2022-07-13: 0.5 mg via ORAL
  Filled 2022-07-12: qty 1

## 2022-07-12 MED ORDER — POTASSIUM CHLORIDE 10 MEQ/100ML IV SOLN
10.0000 meq | INTRAVENOUS | Status: AC
  Administered 2022-07-12 (×4): 10 meq via INTRAVENOUS
  Filled 2022-07-12 (×4): qty 100

## 2022-07-12 MED ORDER — CLONAZEPAM 0.5 MG PO TABS
0.5000 mg | ORAL_TABLET | Freq: Once | ORAL | Status: AC
  Administered 2022-07-12: 0.5 mg via ORAL
  Filled 2022-07-12: qty 1

## 2022-07-12 MED ORDER — LORAZEPAM 2 MG/ML IJ SOLN
0.5000 mg | Freq: Once | INTRAMUSCULAR | Status: AC
  Administered 2022-07-12: 0.5 mg via INTRAVENOUS
  Filled 2022-07-12: qty 1

## 2022-07-12 NOTE — TOC Progression Note (Signed)
Transition of Care [facility]) - Progression Note    Patient Details  Name: Catherine [name] MRN: [id number] Date of Birth: [birth year]  Transition of Care [facility]) CM/SW [place], LCSW Phone Number: 07/12/2022, 4:44 PM  Clinical Narrative:    CSW spoke with patient's daughter and they have selected [facility]. CSW confirmed that [facility] can accept patient when ready pending insurance authorization. CSW will continue to follow for medical readiness.    Expected Discharge Plan: Skilled Nursing Facility Barriers to Discharge: Continued Medical Work up  Expected Discharge Plan and Services Expected Discharge Plan: [place]                                               Social Determinants of Health (SDOH) Interventions    Readmission Risk Interventions     No data to display

## 2022-07-12 NOTE — Progress Notes (Addendum)
HD#4 Subjective:   Summary: Catherine [name] is a 81 year old female with PMH of CAD, AS s/p TAVR, diabetes, CVA, HFpEF, hypothyroidism, RA, complete heart block with pacemaker in 2016 who presents after fall and noted facial droop, right-sided weakness and dysarthria, admitted for CVA work-up versus mechanical fall due to physical deconditioning.  Overnight Events: none  Catherine [name] continues to be disoriented. Oriented to self but not place, time or situation. Repeating words and only answer a few questions. She is noted this morning to have resting tremors and muscle rigidity.   This afternoon, after receiving one dose of Klonopin 0.5 mg, she was alert and oriented x 3. Per daughter, patient ran out of Klonopin for 3 days prior to admission. She was taking an extra dose for pain the week before.    Objective:  Vital signs in last 24 hours: Vitals:   07/11/22 2102 07/12/22 0400 07/12/22 0800 07/12/22 1000  BP: (!) 142/64 (!) 158/75 (!) 171/81 (!) 118/95  Pulse: 96 96 88 94  Resp:  (!) 25 20 (!) 22  Temp: 98.1 F (36.7 C) 98.2 F (36.8 C) 97.8 F (36.6 C)   TempSrc: Oral Oral Oral   SpO2:  99% 98% 97%   Supplemental O2: Room Air SpO2: 97 %   Physical Exam:  Constitutional: alert, in no acute distress HENT: normocephalic atraumatic Neck: supple Cardiovascular: normal rate and rhythm, has pacemaker  MSK: resting tremors noted in UE Neurological: alert & oriented x3 Skin: warm and dry Psych: normal mood and behavior  There were no vitals filed for this visit.   Intake/Output Summary (Last 24 hours) at 07/12/2022 1349 Last data filed at 07/12/2022 1317 Gross per 24 hour  Intake 601.32 ml  Output --  Net 601.32 ml    Net IO Since Admission: -528.68 mL [07/12/22 1349]  Pertinent Labs:    Latest Ref Rng & Units 07/12/2022    3:46 AM 07/11/2022    9:24 AM 07/10/2022    9:05 PM  CBC  WBC 4.0 - 10.5 K/uL 12.8  14.6  21.1   Hemoglobin 12.0 - 15.0 g/dL 12.9  13.0  13.9    Hematocrit 36.0 - 46.0 % 39.6  39.7  43.5   Platelets 150 - 400 K/uL 307  287  324        Latest Ref Rng & Units 07/12/2022    3:46 AM 07/11/2022   12:49 AM 07/10/2022    9:05 PM  CMP  Glucose 70 - 99 mg/dL 113  127  151   BUN 8 - 23 mg/dL '29  28  28   '$ Creatinine 0.44 - 1.00 mg/dL 0.69  0.86  1.03   Sodium 135 - 145 mmol/L 136  140  138   Potassium 3.5 - 5.1 mmol/L 3.3  4.0  4.1   Chloride 98 - 111 mmol/L 102  105  104   CO2 22 - 32 mmol/L '23  22  20   '$ Calcium 8.9 - 10.3 mg/dL 9.6  9.9  9.9   Total Protein 6.5 - 8.1 g/dL   7.1   Total Bilirubin 0.3 - 1.2 mg/dL   1.0   Alkaline Phos 38 - 126 U/L   54   AST 15 - 41 U/L   24   ALT 0 - 44 U/L   14     Imaging: No results found.  Assessment/Plan:   Principal Problem:   Stroke-like symptoms Active Problems:   Type II diabetes mellitus ([place])  Essential hypertension   Hypothyroidism   Abdominal pain   Depression   CAD (coronary artery disease)   Patient Summary: Catherine [name] is a 81 y.o. with a pertinent PMH of CAD, AS s/p TAVR, diabetes, CVA, HFpEF, hypothyroidism, RA, complete heart block s/p pacemaker in 2016, who presented after fall and noted facial droop, right-sided weakness and dysarthria and admitted for CVA workup vs mechanical fall due to physical deconditioning.   Cystocerebral Syndrome Last bladder scan overnight showed 144 ml. Scheduled in and out cath and bladder scans. Urine culture grew Klebsiella pneumoniae 20,000 colonies and started ceftriaxone. Her urinary retention could have caused her delirious state.This morning she appears confused with resting tremors in her UE.  -stopped ceftriaxone -bladder scan every 8 hours -in and out cath every 8 hours -if she continues to have urinary retention, consider foley cath  Hospital delirium Resting tremor Muscle Rigidity History of Fall Strokelike symptoms Physical deconditioning CT head was negative.  Unable to obtain MRI due to incompatibility with  pacemaker. Patient reports poor appetite and extensive chronic conditions leading to health decline.  A1c was 5.4%. LDL 112. No focal neuro deficits. Neuro thinks more encephalopathy than CVA. CK normal. Ammonia normal.  This morning, she was noted to have a resting tremor and rigidity of UE. Was not observed before. Continues to be confused. Oriented to only self. Gave one dose of Ativan 0.'5mg'$  this morning and no change. However, in the afternoon we gave her one dose of Klonopin 0.5 mg which she takes nightly at home. She became alert and oriented x3, much difference noted to how she presented this morning.  -tomorrow start Klonopin 0.5 mg at bedtime  -continue aspirin 81 mg and plavix 75 mg for 3 weeks, then plavix alone -continue atorvastatin to 80 mg -continue metoprolol 12.5 mg twice daily -SLP recommends Dysphagia 1 diet -PT/OT -telemetry  Neck pain after fall X-ray of cervical spine showed multilevel degenerative changes. Neck pain is left-sided but improved and normal ROM.  -lidocaine patch   Complete heart block s/p pacemaker 2016 Pacemaker model: Assurity DR model AQ7622 (serial number [id number]) Last interrogation in May 2023. Unable to obtain MRI due to incompatibility.   HTN CAD (70% LAD disease and mild RCA) Saw cardiology in May. Hold Imdur.  -continue aspirin and atorvastatin -continue losartan 12.5 mg daily  Hypothyroidism TSH low 0.048. Reduced levothyroxine to 100 mcg daily. Will need outpatient f/u to recheck TSH in 6 weeks.   Anxiety, Depression Patient appears more confused and only oriented to self. This morning she has a resting tremor noted in her UE. Holding home sertraline and trazodone. Tomorrow we can re-evaluate about restarting the sertraline and trazodone.   Diet:  Dysphagia 1 IVF: None,None VTE:  SCD Code: Full PT/OT recs:  SNF TOC recs: SNF   Dispo: Anticipated discharge to SNF pending medically stable.   Angelique Blonder, DO Internal Medicine  Resident PGY-1 Please contact the on call pager after 5 pm and on weekends at [phone].

## 2022-07-12 NOTE — Progress Notes (Addendum)
STROKE TEAM PROGRESS NOTE   INTERVAL HISTORY Overnight, HR decreased. Leukocytosis continues to improve. Ammonia, CXR unremarkable. Bcx NG (preliminary).  Patient seen at chair bedside. Patient's aphasia resolved. She is alert and oriented to self, place, year, month. Patient recalls pain with getting dressed this AM. Patient is not oriented to situation, is unable to state what happened yesterday.   Vitals:   07/11/22 2102 07/12/22 0400 07/12/22 0800 07/12/22 1000  BP: (!) 142/64 (!) 158/75 (!) 171/81 (!) 118/95  Pulse: 96 96 88 94  Resp:  (!) 25 20 (!) 22  Temp: 98.1 F (36.7 C) 98.2 F (36.8 C) 97.8 F (36.6 C)   TempSrc: Oral Oral Oral   SpO2:  99% 98% 97%   CBC:  Recent Labs  Lab 07/11/22 0924 07/12/22 0346  WBC 14.6* 12.8*  NEUTROABS 12.0* 9.2*  HGB 13.0 12.9  HCT 39.7 39.6  MCV 86.9 87.8  PLT 287 353   Basic Metabolic Panel:  Recent Labs  Lab 07/10/22 0501 07/10/22 2105 07/11/22 0049 07/12/22 0346  NA 138   < > 140 136  K 4.4   < > 4.0 3.3*  CL 105   < > 105 102  CO2 20*   < > 22 23  GLUCOSE 109*   < > 127* 113*  BUN 23   < > 28* 29*  CREATININE 0.95   < > 0.86 0.69  CALCIUM 10.0   < > 9.9 9.6  MG 2.0  --   --  2.1   < > = values in this interval not displayed.   Lipid Panel:  Recent Labs  Lab 07/08/22 1211  CHOL 180  TRIG 105  HDL 47  CHOLHDL 3.8  VLDL 21  LDLCALC 112*   HgbA1c:  Recent Labs  Lab 07/08/22 1211  HGBA1C 5.4   Urine Drug Screen: No results for input(s): "LABOPIA", "COCAINSCRNUR", "LABBENZ", "AMPHETMU", "THCU", "LABBARB" in the last 168 hours.  Alcohol Level  Recent Labs  Lab 07/08/22 0920  ETH <10    IMAGING past 24 hours DG CHEST PORT 1 VIEW  Result Date: 07/11/2022 CLINICAL DATA:  Altered mental status. EXAM: PORTABLE CHEST 1 VIEW COMPARISON:  June 11, 2022. FINDINGS: The heart size and mediastinal contours are within normal limits. Both lungs are clear. Right-sided pacemaker is unchanged in position. The visualized  skeletal structures are unremarkable. IMPRESSION: No active disease. Electronically Signed   By: Marijo Conception M.D.   On: 07/11/2022 11:13    PHYSICAL EXAM GENERAL: Awake, alert, in no acute distress Psych: Patient is calm and cooperative with examination Head: Normocephalic and atraumatic, without obvious abnormality LUNGS: Normal respiratory effort. Non-labored breathing on room air CV: Regular rate, rhythm on telemetry   NEURO:  Mental Status: Mostly following commands. Oriented to time, place, self. Not oriented to situation.  Cranial Nerves:  V: Blinks to threat bilaterally.  VII: Slight asymmetry with R lip, does not appear to be consistent with facial droop.  Motor: Upper extremities with 5/5 strength. Lower extremities with 4+/5 strength. Symmetric bilaterally.  Coordination: Bilateral UE intention tremor is present. Gait: Deferred for patient safety  ASSESSMENT/PLAN 81 y.o. with PMHx significant for HTN, CAD, DM2, chronic diastolic CHF, RA, hypothyroidism, complete heart block s/p PPM (not MRI compatible)  who presented to the ED on 8/6 after sustaining a mechanical fall from standing at home with right-sided weakness, right facial droop, and slurred speech.  Patient complains of neck and head pain on arrival. Initial and  repeat CT imaging of head are negative for stroke. CTA head and neck with extensive intracranial atherosclerosis. BLE doppler negative. April 2023 Echo shows EF 55%. LDL 112. Hgb AIC 5.4. Patient with new-onset aphasia. Repeat CT head and EEG unremarkable. Patient's resolved aphasia, tachycardia, and no focal deficits on exam is more consistent with acute toxic-metabolic encephalopathy vs. worsened stroke.   New confusion, speech difficulty, resolving - likely encephalopathy 8/8 functional decline over night, much improved 8/10 Lack of focal neuro deficit but mental status decline with speech dysfunction Repeat CT head 8/8 No acute intracranial  abnormality Overnight EEG unremarkable  UA negative but urine culture Klebsiella  tachycardia resolved and leukocytosis improving Ammonia, blood cultures, CXR negative  S/p rocephin x 1  Episode of right-sided weakness, right facial droop, and slurred speech - possible TIA in setting of extensive intracranial atherosclerosis.  Code Stroke CT head 8/6 Negative for hemorrhage or visible infarct. Left scalp swelling. Repeat CT head 8/7 No evidence of acute intracranial abnormality. CTA head & neck Atherosclerosis in the neck causes proximal ICA stenosis of 65% on the right and 40% on the left. Extensive intracranial atherosclerosis most notably causing bilateral high-grade PCA stenosis and moderate right M1 stenosis. MRI: unable to obtain due to incompatibility with pacemaker BLE Doppler no DVT 2D Echo (April 2023) EF 55% with no shunt or thrombus detected  Pacemaker interrogation showed atrial arrhythmia but no A-fib or a flutter. LDL 112 HgbA1c 5.4 VTE prophylaxis -SCDs On ASA 81 PTA, now on DAPT with ASA '81mg'$  and Plavix '75mg'$  x 21 days then plavix alone as monotherapy for life  Therapy recommendations:  CIR Disposition:  TBD  UTI UA negative but urine culture Klebsiella  S/p rocephin x 1 Management per primary team  Hypertension Stable Long-term BP goal normotensive  Hyperlipidemia Home meds:  Lipitor '40mg'$  LDL 112, not at goal < 70 Recommended increase dose of Lipitor from '40mg'$  to '80mg'$   Continue Lipitor '80mg'$  at discharge  Other Stroke Risk Factors Advanced Age >/= 22  Family hx stroke (sister) CAD  Other Active Problems S/p mechanical fall  Heart block with pacemaker RA  Hospital day # 4  This patient was seen and evaluated with Dr. Erlinda Hong.  He directed the plan of care.  Please see AMION for Stroke team contact information.  Rolanda Lundborg, MD, PGY-1  07/12/2022 10:37 AM  ATTENDING NOTE: I reviewed above note and agree with the assessment and plan. Pt was seen  and examined.   No family at bedside.  Patient seen in chair, still has tremor and fidgety but mental status much improved from yesterday.  Today she was able to tell me in the hospital, tell me the month but not the year, knowing her date of birth however cannot tell me her age.  Follows simple commands, no aphasia but paucity of speech, able to name and repeat.  No focal neurologic deficit except resting and postural tremor bilateral upper extremity with asterixis.   Condition still more consistent with encephalopathy, especially in setting of tachycardia, leukocytosis and urine culture positive.  Status post Rocephin x 1 yesterday.  Further treatment per primary team.  Recommend to continue DAPT for 3 weeks and then Plavix alone.  Continue statin.  PT therapy recommend CIR.  For detailed assessment and plan, please refer to above/below as I have made changes wherever appropriate.   Neurology will sign off. Please call with questions. Pt will follow up with stroke clinic NP at [facility] in about 4 weeks. Thanks  for the consult.   Rosalin Hawking, MD PhD Stroke Neurology 07/12/2022 2:28 PM    To contact Stroke Continuity provider, please refer to [email or web address] After hours, contact General Neurology

## 2022-07-12 NOTE — Progress Notes (Addendum)
Physical Therapy Treatment Patient Details Name: Catherine [name] MRN: [id number] DOB: October 06, 1941 Today's Date: 07/12/2022   History of Present Illness Patient is a 81 yo female admitted to ED on 07/08/22 with stroke like symptoms. CT negative, no emergent finding with CTA and CTP.  Patient does have extensive intracranial atherosclerosis with bilateral high-grade PCA stenosis and moderate right M1 stenosis. Pt with AMS and aphasic changes on 8/9, per neurology suspect encephalopathy but cannot rule out CVA (MRI unable to be obtained due to pacemaker). PMH includes: CAD, CHF, RA, DM2, HTN and heart block with PPM    PT Comments    Pt responding to PT questions today, not oriented to situation but is oriented to self and time on PT assessment. Per comparison with OT note yesterday, pt communicating better with more phrases today and required less physical assist. + UE tremors noted, rigidity not appreciated during session. Pt overall requiring light physical assist for bed mobility, transfer to standing, and pivotal stepping to reach recliner. Pt fatigues quickly, requiring rest after transfer. Pt noted to be tachycardic with transfer up to 120 bpm, with alarm stating "vtach" although pt did not demonstrate any change in status or distress.      Recommendations for follow up therapy are one component of a multi-disciplinary discharge planning process, led by the attending physician.  Recommendations may be updated based on patient status, additional functional criteria and insurance authorization.  Follow Up Recommendations  Acute inpatient rehab (3hours/day) (vs ST-SNF if AIR declines) Can patient physically be transported by private vehicle: Yes   Assistance Recommended at Discharge Frequent or constant Supervision/Assistance  Patient can return home with the following A little help with walking and/or transfers;A little help with bathing/dressing/bathroom;Assistance with cooking/housework;Direct  supervision/assist for medications management;Direct supervision/assist for financial management;Assist for transportation;Help with stairs or ramp for entrance   Equipment Recommendations  None recommended by PT    Recommendations for Other Services       Precautions / Restrictions Precautions Precautions: Fall Precaution Comments: watch HR and BP Restrictions Weight Bearing Restrictions: No     Mobility  Bed Mobility Overal bed mobility: Needs Assistance Bed Mobility: Supine to Sit     Supine to sit: Mod assist     General bed mobility comments: assist for LE progression to EOB, trunk elevation, and scooting to EOB.    Transfers Overall transfer level: Needs assistance Equipment used: Rolling walker (2 wheels) Transfers: Sit to/from Stand, Bed to chair/wheelchair/BSC Sit to Stand: Min assist   Step pivot transfers: Min assist       General transfer comment: min assist for power up, rise, steadying, and stepping to recliner at bedside. Pt fatigues quickly, HR elevation to 120 bpm with alarm for "vtach" but pt in no distress    Ambulation/Gait                   Stairs             Wheelchair Mobility    Modified Rankin (Stroke Patients Only) Modified Rankin (Stroke Patients Only) Pre-Morbid Rankin Score: Slight disability Modified Rankin: Moderately severe disability     Balance Overall balance assessment: Needs assistance Sitting-balance support: Feet supported Sitting balance-Leahy Scale: Fair     Standing balance support: Bilateral upper extremity supported, During functional activity Standing balance-Leahy Scale: Poor Standing balance comment: reliant on external support of PT and RW  Cognition Arousal/Alertness: Awake/alert Behavior During Therapy: WFL for tasks assessed/performed Overall Cognitive Status: Impaired/Different from baseline Area of Impairment: Orientation, Memory, Following  commands, Safety/judgement                 Orientation Level: Disoriented to, Time, Situation   Memory: Decreased recall of precautions Following Commands: Follows one step commands with increased time Safety/Judgement: Decreased awareness of safety Awareness: Intellectual Problem Solving: Requires verbal cues, Requires tactile cues, Decreased initiation, Difficulty sequencing, Slow processing General Comments: pt responding appropriately to PT questions today. Pt not oriented to year or reason for admission.        Exercises      General Comments        Pertinent Vitals/Pain Pain Assessment Pain Assessment: No/denies pain    Home Living                          Prior Function            PT Goals (current goals can now be found in the care plan section) Acute Rehab PT Goals PT Goal Formulation: With patient Time For Goal Achievement: 07/23/22 Potential to Achieve Goals: Good Progress towards PT goals: Progressing toward goals    Frequency    Min 3X/week      PT Plan Current plan remains appropriate    Co-evaluation              AM-PAC PT "6 Clicks" Mobility   Outcome Measure  Help needed turning from your back to your side while in a flat bed without using bedrails?: A Little Help needed moving from lying on your back to sitting on the side of a flat bed without using bedrails?: A Little Help needed moving to and from a bed to a chair (including a wheelchair)?: A Little Help needed standing up from a chair using your arms (e.g., wheelchair or bedside chair)?: A Little Help needed to walk in hospital room?: A Little Help needed climbing 3-5 steps with a railing? : A Lot 6 Click Score: 17    End of Session Equipment Utilized During Treatment: Gait belt Activity Tolerance: Patient limited by fatigue Patient left: in chair;with call bell/phone within reach;with chair alarm set;with nursing/sitter in room Nurse Communication: Mobility  status PT Visit Diagnosis: Unsteadiness on feet (R26.81);Muscle weakness (generalized) (M62.81);Hemiplegia and hemiparesis;Adult, failure to thrive (R62.7) Hemiplegia - Right/Left: Right Hemiplegia - dominant/non-dominant: Dominant     Time: 0175-1025 PT Time Calculation (min) (ACUTE ONLY): 18 min  Charges:  $Therapeutic Activity: 8-22 mins                     Stacie Glaze, PT DPT Acute Rehabilitation Services Pager [phone]  Office [phone]    [place] E Ruffin Pyo 07/12/2022, 11:19 AM

## 2022-07-13 DIAGNOSIS — N390 Urinary tract infection, site not specified: Secondary | ICD-10-CM

## 2022-07-13 DIAGNOSIS — S31801A Laceration without foreign body of unspecified buttock, initial encounter: Secondary | ICD-10-CM

## 2022-07-13 DIAGNOSIS — F419 Anxiety disorder, unspecified: Secondary | ICD-10-CM

## 2022-07-13 DIAGNOSIS — F32A Depression, unspecified: Secondary | ICD-10-CM

## 2022-07-13 DIAGNOSIS — F13130 Sedative, hypnotic or anxiolytic abuse with withdrawal, uncomplicated: Secondary | ICD-10-CM

## 2022-07-13 LAB — URINE CULTURE: Culture: 20000 — AB

## 2022-07-13 LAB — BASIC METABOLIC PANEL
Anion gap: 11 (ref 5–15)
BUN: 30 mg/dL — ABNORMAL HIGH (ref 8–23)
CO2: 22 mmol/L (ref 22–32)
Calcium: 9.5 mg/dL (ref 8.9–10.3)
Chloride: 101 mmol/L (ref 98–111)
Creatinine, Ser: 0.9 mg/dL (ref 0.44–1.00)
GFR, Estimated: >60 mL/min (ref >60)
Glucose, Bld: 125 mg/dL — ABNORMAL HIGH (ref 70–99)
Potassium: 3.6 mmol/L (ref 3.5–5.1)
Sodium: 134 mmol/L — ABNORMAL LOW (ref 135–145)

## 2022-07-13 LAB — CBC
HCT: 40.5 % (ref 36.0–46.0)
Hemoglobin: 13 g/dL (ref 12.0–15.0)
MCH: 28.3 pg (ref 26.0–34.0)
MCHC: 32.1 g/dL (ref 30.0–36.0)
MCV: 88.2 fL (ref 80.0–100.0)
Platelets: 333 10*3/uL (ref 150–400)
RBC: 4.59 MIL/uL (ref 3.87–5.11)
RDW: 13.3 % (ref 11.5–15.5)
WBC: 15.1 10*3/uL — ABNORMAL HIGH (ref 4.0–10.5)
nRBC: 0 % (ref 0.0–0.2)

## 2022-07-13 LAB — GLUCOSE, CAPILLARY
Glucose-Capillary: 145 mg/dL — ABNORMAL HIGH (ref 70–99)
Glucose-Capillary: 197 mg/dL — ABNORMAL HIGH (ref 70–99)

## 2022-07-13 MED ORDER — SODIUM CHLORIDE 0.9 % IV SOLN
1.0000 g | INTRAVENOUS | Status: AC
  Administered 2022-07-13 – 2022-07-14 (×2): 1 g via INTRAVENOUS
  Filled 2022-07-13 (×2): qty 10

## 2022-07-13 MED ORDER — SERTRALINE HCL 25 MG PO TABS
125.0000 mg | ORAL_TABLET | Freq: Every day | ORAL | Status: DC
  Administered 2022-07-13 – 2022-07-16 (×4): 125 mg via ORAL
  Filled 2022-07-13 (×4): qty 1

## 2022-07-13 MED ORDER — TRAZODONE HCL 50 MG PO TABS
50.0000 mg | ORAL_TABLET | Freq: Every day | ORAL | Status: DC
  Administered 2022-07-13: 50 mg via ORAL
  Filled 2022-07-13: qty 1

## 2022-07-13 MED ORDER — ZINC OXIDE 12.8 % EX OINT
TOPICAL_OINTMENT | Freq: Four times a day (QID) | CUTANEOUS | Status: DC
  Filled 2022-07-13: qty 56.7

## 2022-07-13 NOTE — Progress Notes (Signed)
HD#5 Subjective:   Summary: Catherine [name] is a 81 year old female with PMH of CAD, AS s/p TAVR, diabetes, CVA, HFpEF, hypothyroidism, RA, complete heart block with pacemaker in 2016 who presents after fall and noted facial droop, right-sided weakness and dysarthria, admitted for CVA work-up versus mechanical fall due to physical deconditioning.  Overnight Events: patient was agitated and given Ativan 0.5 mg once  Catherine [name] was more awake and oriented x3 to person, place and time. She reports pain in her gluteal area which she is unsure when it started.    Objective:  Vital signs in last 24 hours: Vitals:   07/13/22 0300 07/13/22 0400 07/13/22 0500 07/13/22 0600  BP: 114/62 105/86 116/70 (!) 127/93  Pulse: (!) 103 (!) 106 95 97  Resp: (!) 23 (!) 24 (!) 23 (!) 24  Temp:    98.1 F (36.7 C)  TempSrc:    Oral  SpO2: 99% 99% 97% 98%   Supplemental O2: Room Air SpO2: 98 %   Physical Exam:  Constitutional: alert, in no acute distress HENT: normocephalic atraumatic Neck: supple Cardiovascular: normal rate and rhythm, has pacemaker  MSK: no tremors noted today  Neurological: alert & oriented x3 to person, place and time Skin: skin tear noted around intergluteal cleft Psych: normal mood and behavior  There were no vitals filed for this visit.   Intake/Output Summary (Last 24 hours) at 07/13/2022 0705 Last data filed at 07/13/2022 0650 Gross per 24 hour  Intake 721.32 ml  Output 600 ml  Net 121.32 ml    Net IO Since Admission: -1,008.68 mL [07/13/22 0705]  Pertinent Labs:    Latest Ref Rng & Units 07/12/2022    3:46 AM 07/11/2022    9:24 AM 07/10/2022    9:05 PM  CBC  WBC 4.0 - 10.5 K/uL 12.8  14.6  21.1   Hemoglobin 12.0 - 15.0 g/dL 12.9  13.0  13.9   Hematocrit 36.0 - 46.0 % 39.6  39.7  43.5   Platelets 150 - 400 K/uL 307  287  324        Latest Ref Rng & Units 07/12/2022    3:23 PM 07/12/2022    3:46 AM 07/11/2022   12:49 AM  CMP  Glucose 70 - 99 mg/dL 154  113   127   BUN 8 - 23 mg/dL '29  29  28   '$ Creatinine 0.44 - 1.00 mg/dL 0.72  0.69  0.86   Sodium 135 - 145 mmol/L 135  136  140   Potassium 3.5 - 5.1 mmol/L 3.8  3.3  4.0   Chloride 98 - 111 mmol/L 102  102  105   CO2 22 - 32 mmol/L '24  23  22   '$ Calcium 8.9 - 10.3 mg/dL 9.3  9.6  9.9     Imaging: No results found.  Assessment/Plan:   Principal Problem:   Stroke-like symptoms Active Problems:   Type II diabetes mellitus (HCC)   Essential hypertension   Hypothyroidism   Abdominal pain   Depression   CAD (coronary artery disease)   Patient Summary: Catherine [name] is a 81 y.o. with a pertinent PMH of CAD, AS s/p TAVR, diabetes, CVA, HFpEF, hypothyroidism, RA, complete heart block s/p pacemaker in 2016, who presented after fall and noted facial droop, right-sided weakness and dysarthria and admitted for CVA workup vs mechanical fall due to physical deconditioning.   Benzodiazepine withdrawal Resting tremor Muscle Rigidity History of Fall Strokelike symptoms Physical deconditioning CT  head was negative.  Unable to obtain MRI due to incompatibility with pacemaker. Patient reports poor appetite and extensive chronic conditions leading to health decline.  A1c was 5.4%. LDL 112. No focal neuro deficits. Neuro thinks more encephalopathy than CVA. CK normal. Ammonia normal. SLP recommends dysphagia 1 diet. PT and OT recommend SNF. TOC working on [employer] for [employer]  This morning, she was alert and oriented x 3. Appears that the tremors, rigidity and mental status changes are due to the benzodiazepine withdrawal. Daughter noted that the week prior to admission, patient was doubling her Klonopin due to her pain. Ran out 3 days prior to coming into ED.  -start Klonopin 0.5 mg at bedtime  -if she becomes agitated, can give Ativan 0.5 mg  -continue aspirin 81 mg and plavix 75 mg for 3 weeks, then plavix alone -continue atorvastatin to 80 mg -continue metoprolol 12.5 mg  twice daily -telemetry  UTI Cystocerebral Syndrome Scheduled in and out cath and bladder scans every 8 hours. Urine culture grew Klebsiella pneumoniae 20,000 colonies and received one dose of ceftriaxone. Her urinary retention could have contributed her delirious state. More likely the mental status changes are from benzodiazepine withdrawal. We can give her 2 doses of ceftriaxone for UTI.  -start ceftriaxone for 2 doses -bladder scan every 8 hours -in and out cath every 8 hours -if she continues to have urinary retention, consider foley cath  Neck pain after fall X-ray of cervical spine showed multilevel degenerative changes. Neck pain is left-sided but improved and normal ROM.  -lidocaine patch   Complete heart block s/p pacemaker 2016 Pacemaker model: Assurity DR model [id number] (serial number [id number]) Last interrogation in May 2023. Unable to obtain MRI due to incompatibility.   HTN CAD (70% LAD disease and mild RCA) Saw cardiology in May. Hold Imdur.  -continue aspirin and atorvastatin -continue losartan 12.5 mg daily  Hypothyroidism TSH low 0.048. Reduced levothyroxine to 100 mcg daily. Will need outpatient f/u to recheck TSH in 6 weeks.   Anxiety, Depression Patient is alert and oriented x 3 today. Appears better compared to yesterday. Able to carry conversation. Mental status changes, tremors and rigidity likely from benzodiazepine withdrawal.  -restart sertraline and trazodone  Skin Tear, intergluteal cleft Patient reports pain on her buttocks. Exam showed skin tear at intergluteal cleft. Appreciated wound care consult.  -start triple paste on affected area  Diet:  Dysphagia 1 IVF: None,None VTE:  SCD Code: Full PT/OT recs:  SNF TOC recs: SNF   Dispo: Anticipated discharge to SNF pending medically stable.   Angelique Blonder, DO Internal Medicine Resident PGY-1 Please contact the on call pager after 5 pm and on weekends at [phone].

## 2022-07-13 NOTE — Progress Notes (Signed)
Speech Language Pathology Treatment: Cognitive-Linquistic  Patient Details Name: Catherine [name] MRN: [id number] DOB: 01/08/1941 Today's Date: 07/13/2022 Time: 4536-4680 SLP Time Calculation (min) (ACUTE ONLY): 15 min  Assessment / Plan / Recommendation Clinical Impression  Pt's verbal communication has improved since last admission where she could only repeat 2 words during session. Today able to make several needs known re: toileting however as session progressed she had difficulty communicating with hesitations, false starts. She was oriented to place but not situation. Pt attempted to place dentures needing assist for hand to mouth and adequate placement.   No orders for swallow however this SLP asked RN to change diet to puree after pocketing seen by PT-A. Today she appears does not appear able to progress from informal observation therefore no swallow orders requested or for staff to upgrade texture at this time. Pt scheduled for SNF- recommend pt to continue ST at SNF for cognition/communication and upgrade diet.     HPI HPI: Patient is a 81 yo female admitted to ED on 07/08/22 with stroke like symptoms. CT negative, no emergent finding with CTA and CTP.  Patient does have extensive intracranial atherosclerosis with bilateral high-grade PCA stenosis and moderate right M1 stenosis.. MRI pending. PMH includes: CAD, CHF, RA, DM2, HTN and heart block with PPM      SLP Plan  Continue with current plan of care      Recommendations for follow up therapy are one component of a multi-disciplinary discharge planning process, led by the attending physician.  Recommendations may be updated based on patient status, additional functional criteria and insurance authorization.    Recommendations                   Oral Care Recommendations: Oral care BID Follow Up Recommendations: Skilled nursing-short term rehab (<3 hours/day) SLP Visit Diagnosis: Cognitive communication deficit (H21.224) Plan:  Continue with current plan of care           Houston Siren  07/13/2022, 10:24 AM

## 2022-07-13 NOTE — Progress Notes (Signed)
Pt was restless.Bladder scan done was 4317m.In and out catheterization done.Output was 70417mand post void bladder scan was 17m71m

## 2022-07-13 NOTE — Consult Note (Signed)
WOC Nurse Consult Note: Patient receiving care in [facility] [phone]. Reason for Consult: sacral wound Wound type: MASD-IAD to intergluteal cleft. Purewick in use Pressure Injury POA: Yes/No/NA Measurement: 2 cm x 0.3 cm fissure with yellow base Wound bed: yellow base Drainage (amount, consistency, odor) none Periwound: blanchable erythema Dressing procedure/placement/frequency: Four times daily and prn application of Triple Paste.  Monitor the wound area(s) for worsening of condition such as: Signs/symptoms of infection,  Increase in size,  Development of or worsening of odor, Development of pain, or increased pain at the affected locations.  Notify the medical team if any of these develop.  Thank you for the consult.  Discussed plan of care with the patient.  [place] nurse will not follow at this time.  Please re-consult the [place] team if needed.  Val Riles, RN, MSN, CWOCN, CNS-BC, pager [phone]

## 2022-07-13 NOTE — Progress Notes (Signed)
Occupational Therapy Treatment Patient Details Name: Catherine [name] MRN: [id number] DOB: 01/01/1941 Today's Date: 07/13/2022   History of present illness Patient is a 81 yo female admitted to ED on 07/08/22 with stroke like symptoms. CT negative, no emergent finding with CTA and CTP.  Patient does have extensive intracranial atherosclerosis with bilateral high-grade PCA stenosis and moderate right M1 stenosis. Pt with AMS and aphasic changes on 8/9, per neurology suspect encephalopathy but cannot rule out CVA (MRI unable to be obtained due to pacemaker). PMH includes: CAD, CHF, RA, DM2, HTN and heart block with PPM   OT comments  Patient continues to make incremental progress towards goals in skilled OT session. Patient's session encompassed progression with functional mobility and ADLs. Session limited in full scope due to patient with BM and unaware requiring full linen change and total A for peri-care. Patient with increased impulsivity in session, frequently pushing back when seated EOB and noted to be tremulous with all movement. Patient only oriented to self in session, with increased time needed for all commands. OT recommendation downgraded to SNF due to current level of deficits and unable to currently tolerate extended bouts of therapy. OT will continue to follow acutely.      Recommendations for follow up therapy are one component of a multi-disciplinary discharge planning process, led by the attending physician.  Recommendations may be updated based on patient status, additional functional criteria and insurance authorization.    Follow Up Recommendations  Skilled nursing-short term rehab (<3 hours/day)    Assistance Recommended at Discharge Frequent or constant Supervision/Assistance  Patient can return home with the following  A lot of help with walking and/or transfers;A lot of help with bathing/dressing/bathroom;Assistance with cooking/housework;Direct supervision/assist for  medications management;Direct supervision/assist for financial management;Assist for transportation;Help with stairs or ramp for entrance   Equipment Recommendations  Other (comment) (defer to next venue)    Recommendations for Other Services      Precautions / Restrictions Precautions Precautions: Fall Precaution Comments: watch HR and BP Restrictions Weight Bearing Restrictions: No       Mobility Bed Mobility Overal bed mobility: Needs Assistance Bed Mobility: Supine to Sit, Sit to Supine     Supine to sit: Mod assist Sit to supine: Max assist   General bed mobility comments: assist for LE progression to EOB, trunk elevation, and scooting to EOB, increased assist to return back to bed due to BM    Transfers Overall transfer level: Needs assistance Equipment used: 1 person hand held assist   Sit to Stand: Mod assist, Max assist           General transfer comment: unable to fully power up into standing to date, decreased reponse and activation to simple commands, significant asssit provided at hips to maintain balance on feet due to posterior lean     Balance Overall balance assessment: Needs assistance Sitting-balance support: Feet supported Sitting balance-Leahy Scale: Fair     Standing balance support: Bilateral upper extremity supported, During functional activity Standing balance-Leahy Scale: Poor Standing balance comment: reliant on external support of OT                           ADL either performed or assessed with clinical judgement   ADL Overall ADL's : Needs assistance/impaired     Grooming: Wash/dry hands;Wash/dry face;Sitting;Set up                   Toilet Transfer: Cueing  for safety;Cueing for sequencing;Moderate assistance;Stand-pivot Toilet Transfer Details (indicate cue type and reason): unable to transfer due to soiled linens requiring bed level clean up Toileting- Clothing Manipulation and Hygiene: Total assistance;+2  for physical assistance;+2 for safety/equipment;Bed level Toileting - Clothing Manipulation Details (indicate cue type and reason): patient soiled and unaware, requiring full peri-care and linen change     Functional mobility during ADLs: Moderate assistance;Maximal assistance;Cueing for safety;Cueing for sequencing General ADL Comments: Patient progressing incrementally, more tremulous with movement in session    Extremity/Trunk Assessment              Vision       Perception     Praxis      Cognition Arousal/Alertness: Lethargic Behavior During Therapy: Impulsive, Anxious Overall Cognitive Status: Impaired/Different from baseline Area of Impairment: Orientation, Memory, Following commands, Safety/judgement, Attention                 Orientation Level: Disoriented to, Time, Situation, Place Current Attention Level: Focused Memory: Decreased recall of precautions, Decreased short-term memory Following Commands: Follows one step commands inconsistently Safety/Judgement: Decreased awareness of safety Awareness: Intellectual Problem Solving: Requires verbal cues, Requires tactile cues, Decreased initiation, Difficulty sequencing, Slow processing General Comments: Patient with minimal vocalizations, oriented to self only, impulsive and tremulous with movement        Exercises      Shoulder Instructions       General Comments      Pertinent Vitals/ Pain       Pain Assessment Pain Assessment: Faces Faces Pain Scale: Hurts even more Pain Location: generalized Pain Intervention(s): Limited activity within patient's tolerance, Monitored during session, Repositioned  Home Living                                          Prior Functioning/Environment              Frequency  Min 2X/week        Progress Toward Goals  OT Goals(current goals can now be found in the care plan section)  Progress towards OT goals: Progressing toward  goals  Acute Rehab OT Goals Patient Stated Goal: none stated OT Goal Formulation: Patient unable to participate in goal setting Time For Goal Achievement: 07/23/22 Potential to Achieve Goals: [place] Discharge plan needs to be updated    Co-evaluation                 AM-PAC OT "6 Clicks" Daily Activity     Outcome Measure   Help from another person eating meals?: A Lot Help from another person taking care of personal grooming?: A Little Help from another person toileting, which includes using toliet, bedpan, or urinal?: Total Help from another person bathing (including washing, rinsing, drying)?: A Lot Help from another person to put on and taking off regular upper body clothing?: A Lot Help from another person to put on and taking off regular lower body clothing?: A Lot 6 Click Score: 12    End of Session Equipment Utilized During Treatment: Gait belt  OT Visit Diagnosis: Unsteadiness on feet (R26.81);Muscle weakness (generalized) (M62.81);History of falling (Z91.81);Other symptoms and signs involving the nervous system (R29.898);Other symptoms and signs involving cognitive function   Activity Tolerance Patient limited by fatigue   Patient Left with call bell/phone within reach;with nursing/sitter in room;in bed   Nurse Communication Mobility status  Time: 3685-9923 OT Time Calculation (min): 30 min  Charges: OT General Charges $OT Visit: 1 Visit OT Treatments $Self Care/Home Management : 23-37 mins  Corinne Ports E. Embry Huss, OTR/L Acute Rehabilitation Services [phone]   Ascencion Dike 07/13/2022, 12:53 PM

## 2022-07-13 NOTE — TOC Progression Note (Signed)
Transition of Care [facility]) - Progression Note    Patient Details  Name: Catherine [name] MRN: [id number] Date of Birth: 1941-03-17  Transition of Care [facility]) CM/SW Angelica, LCSW Phone Number: 07/13/2022, 12:29 PM  Clinical Narrative:    CSW initiated insurance authorization process for [facility] [employer], Ref# [id number], in the event patient is medically ready for discharge over the weekend.    Expected Discharge Plan: Skilled Nursing Facility Barriers to Discharge: Continued Medical Work up  Expected Discharge Plan and Services Expected Discharge Plan: [place]                                               Social Determinants of Health (SDOH) Interventions    Readmission Risk Interventions     No data to display

## 2022-07-14 LAB — CBC
HCT: 37.6 % (ref 36.0–46.0)
Hemoglobin: 11.9 g/dL — ABNORMAL LOW (ref 12.0–15.0)
MCH: 28.3 pg (ref 26.0–34.0)
MCHC: 31.6 g/dL (ref 30.0–36.0)
MCV: 89.3 fL (ref 80.0–100.0)
Platelets: 299 10*3/uL (ref 150–400)
RBC: 4.21 MIL/uL (ref 3.87–5.11)
RDW: 13.3 % (ref 11.5–15.5)
WBC: 10.5 10*3/uL (ref 4.0–10.5)
nRBC: 0 % (ref 0.0–0.2)

## 2022-07-14 LAB — BASIC METABOLIC PANEL
Anion gap: 10 (ref 5–15)
BUN: 29 mg/dL — ABNORMAL HIGH (ref 8–23)
CO2: 23 mmol/L (ref 22–32)
Calcium: 9.5 mg/dL (ref 8.9–10.3)
Chloride: 108 mmol/L (ref 98–111)
Creatinine, Ser: 0.75 mg/dL (ref 0.44–1.00)
GFR, Estimated: >60 mL/min (ref >60)
Glucose, Bld: 125 mg/dL — ABNORMAL HIGH (ref 70–99)
Potassium: 3.5 mmol/L (ref 3.5–5.1)
Sodium: 141 mmol/L (ref 135–145)

## 2022-07-14 LAB — GLUCOSE, CAPILLARY
Glucose-Capillary: 178 mg/dL — ABNORMAL HIGH (ref 70–99)
Glucose-Capillary: 211 mg/dL — ABNORMAL HIGH (ref 70–99)

## 2022-07-14 MED ORDER — TRAZODONE HCL 50 MG PO TABS
50.0000 mg | ORAL_TABLET | Freq: Every day | ORAL | Status: DC
  Administered 2022-07-14 – 2022-07-15 (×2): 50 mg via ORAL
  Filled 2022-07-14 (×2): qty 1

## 2022-07-14 MED ORDER — INSULIN ASPART 100 UNIT/ML IJ SOLN
0.0000 [IU] | Freq: Every day | INTRAMUSCULAR | Status: DC
  Administered 2022-07-14: 2 [IU] via SUBCUTANEOUS

## 2022-07-14 MED ORDER — INSULIN ASPART 100 UNIT/ML IJ SOLN
0.0000 [IU] | Freq: Three times a day (TID) | INTRAMUSCULAR | Status: DC
  Administered 2022-07-15: 1 [IU] via SUBCUTANEOUS

## 2022-07-14 MED ORDER — CLONAZEPAM 0.5 MG PO TABS
0.5000 mg | ORAL_TABLET | Freq: Every day | ORAL | Status: DC
  Administered 2022-07-14 – 2022-07-15 (×2): 0.5 mg via ORAL
  Filled 2022-07-14 (×2): qty 1

## 2022-07-14 NOTE — Progress Notes (Signed)
Pt's bladder scan showed 230 mls at 2000 with no bladder distension. Pt's last void was yesterday via in and out cath. Farrel Gordon, MD was notified. Will continue with the scheduled bladder scans

## 2022-07-14 NOTE — Plan of Care (Signed)
  Problem: Education: Goal: Knowledge of General Education information will improve Description: Including pain rating scale, medication(s)/side effects and non-pharmacologic comfort measures Outcome: Not Progressing   Problem: Health Behavior/Discharge Planning: Goal: Ability to manage health-related needs will improve Outcome: Not Progressing   Problem: Clinical Measurements: Goal: Ability to maintain clinical measurements within normal limits will improve Outcome: Progressing Goal: Will remain free from infection Outcome: Progressing Goal: Diagnostic test results will improve Outcome: Progressing Goal: Respiratory complications will improve Outcome: Progressing Goal: Cardiovascular complication will be avoided Outcome: Progressing   Problem: Activity: Goal: Risk for activity intolerance will decrease Outcome: Not Progressing   Problem: Nutrition: Goal: Adequate nutrition will be maintained Outcome: Progressing   Problem: Coping: Goal: Level of anxiety will decrease Outcome: Progressing   Problem: Elimination: Goal: Will not experience complications related to bowel motility Outcome: Progressing Goal: Will not experience complications related to urinary retention Outcome: Progressing   Problem: Pain Managment: Goal: General experience of comfort will improve Outcome: Progressing   Problem: Safety: Goal: Ability to remain free from injury will improve Outcome: Progressing   Problem: Skin Integrity: Goal: Risk for impaired skin integrity will decrease Outcome: Progressing   

## 2022-07-14 NOTE — Progress Notes (Addendum)
HD#6 Subjective:   Summary: Catherine [name] is a 81 year old female with PMH of Hx of mulitple SI attempts, anxiety, depression, CAD, AS s/p TAVR, diabetes, CVA, HFpEF, hypothyroidism, RA, complete heart block with pacemaker in 2016 who presents after fall and noted facial droop, right-sided weakness and dysarthria, admitted for CVA work-up versus mechanical fall and found to be in benzodiazapine withdrawal.   Overnight Events: No acute events overnight  Resting in bed comfortably with daughter [name] at bedside. Daughter states patient ran out of tramadol and klonopin prior to coming into the hospital. Patient's daughter puts pills in a pill box but patient keeps bottles of tramadol and klonopin on her at all times. Discussed with daughter moving forward will need to lock these away from patient with her history of multiple suicide attempts. Daughter feels patient's mentation is improving and almost at  baseline. Having some discomfort from pressure ulcer.   Objective:  Vital signs in last 24 hours: Vitals:   07/13/22 2000 07/14/22 0000 07/14/22 0406 07/14/22 0815  BP: (!) 137/103 (!) 121/52 108/83 (!) 135/90  Pulse: 100 77 89 100  Resp: '20 20 20 20  '$ Temp: 98.1 F (36.7 C) 98 F (36.7 C) 98.1 F (36.7 C) 98.5 F (36.9 C)  TempSrc: Oral Oral Oral Oral  SpO2:  96% 97% 97%   Supplemental O2: Room Air SpO2: 97 %   Physical Exam:  Constitutional: no acute distress HENT: normocephalic atraumatic Eyes: conjunctiva non-erythematous Neck: supple Cardiovascular: regular rate Pulmonary/Chest: normal work of breathing on room air MSK: normal bulk and tone Neurological: alert & oriented x to person, place, and time. Not oriented to situation Skin: warm and dry Psych: normal mood.     Intake/Output Summary (Last 24 hours) at 07/14/2022 0844 Last data filed at 07/13/2022 1824 Gross per 24 hour  Intake 5.26 ml  Output 700 ml  Net -694.74 ml   Net IO Since Admission: -1,903.42 mL  [07/14/22 0844]  Pertinent Labs:    Latest Ref Rng & Units 07/14/2022    3:30 AM 07/13/2022    7:39 AM 07/12/2022    3:46 AM  CBC  WBC 4.0 - 10.5 K/uL 10.5  15.1  12.8   Hemoglobin 12.0 - 15.0 g/dL 11.9  13.0  12.9   Hematocrit 36.0 - 46.0 % 37.6  40.5  39.6   Platelets 150 - 400 K/uL 299  333  307        Latest Ref Rng & Units 07/14/2022    3:30 AM 07/13/2022    7:39 AM 07/12/2022    3:23 PM  CMP  Glucose 70 - 99 mg/dL 125  125  154   BUN 8 - 23 mg/dL '29  30  29   '$ Creatinine 0.44 - 1.00 mg/dL 0.75  0.90  0.72   Sodium 135 - 145 mmol/L 141  134  135   Potassium 3.5 - 5.1 mmol/L 3.5  3.6  3.8   Chloride 98 - 111 mmol/L 108  101  102   CO2 22 - 32 mmol/L '23  22  24   '$ Calcium 8.9 - 10.3 mg/dL 9.5  9.5  9.3     Assessment/Plan:   Principal Problem:   Stroke-like symptoms Active Problems:   Type II diabetes mellitus (HCC)   Essential hypertension   Hypothyroidism   Abdominal pain   Depression   CAD (coronary artery disease)  Patient Summary: Catherine [name] is a 81 year old female with PMH of Hx of mulitple SI attempts,  anxiety, depression, CAD, AS s/p TAVR, diabetes, CVA, HFpEF, hypothyroidism, RA, complete heart block with pacemaker in 2016 who presents after fall and noted facial droop, right-sided weakness and dysarthria, admitted for CVA work-up versus mechanical fall and found to be in benzodiazapine withdrawal.   Polypharmacy Benzodiazepine/Tramadol withdrawal  Mentation and asphasia have improved since restarting tramadol and klonipin. Daugther notes patient ran out of prescription earlier than usual lats week. Prior ot her hospitalization she had been out of both tramadol and klonopin for a few days. Discussed importance of locking away these medicines from patient, family, and friends.  -continue klonipin.5 mg and traodone 50 mg QHS -follow up outpatient psychiatry  Stroke-like symptoms Presented with weakness, confusion, right sided weakness, facial droop and slurred  speech. CT imaging without evidence of acute infarct or hemmorrhage. Neurology have signed off. Unable to perform MRI with pacemaker. Symptoms have resolved -continue plavix/aspirin daily for 21 days then plavix alone for life (DAPT end 07/29/22)  UTI Cystocerebral Syndrome Continues to have urinary retention, last bladder scan with 400cc's. Output 765m, post bladder scan 02m If retention again today, will place foley catheter. She is on oxybutinin at home which we are holding.  -last dose of ceftriaxone today, day 3/3 -continue bladder scans and place foley if continued retention  Anxiety/Depression Continue sertraline and trazodone. Unclear if patient attempted overdose by taking extra medications. When asked about this, she is unclear as to why she ran out of medications early. Does have Hx of multiple suicide attempts in the past, one requiring hospitalization.   Sacral Wound Continue wound care management Offload and role multiple times per day  Hx of complete heart block s/p pacemaker Continue metoprolol for rate control  HTN CAD (70% LAD disease and mild RCA) -continue aspirin, atorvastatin, losartan daily  Diet:  dysphagia 1 IVF: None,None VTE: will start xarelto 10 mg daily for prophylaxis Code: Full PT/OT recs: SNF. TOC recs: Pending placement to SNF Family Update: daughter at bedside today   Dispo: Anticipated discharge to Skilled nursing facility pending placement  [facility] Resident PGY-3 Please contact the on call pager after 5 pm and on weekends at [phone]

## 2022-07-14 NOTE — Progress Notes (Signed)
Pt's AM CBG was 178.Informed MD.

## 2022-07-15 ENCOUNTER — Encounter (HOSPITAL_COMMUNITY): Payer: Self-pay | Admitting: Internal Medicine

## 2022-07-15 DIAGNOSIS — R339 Retention of urine, unspecified: Secondary | ICD-10-CM | POA: Diagnosis present

## 2022-07-15 DIAGNOSIS — R29818 Other symptoms and signs involving the nervous system: Secondary | ICD-10-CM

## 2022-07-15 DIAGNOSIS — E878 Other disorders of electrolyte and fluid balance, not elsewhere classified: Secondary | ICD-10-CM

## 2022-07-15 DIAGNOSIS — L89159 Pressure ulcer of sacral region, unspecified stage: Secondary | ICD-10-CM

## 2022-07-15 LAB — BASIC METABOLIC PANEL
Anion gap: 9 (ref 5–15)
BUN: 32 mg/dL — ABNORMAL HIGH (ref 8–23)
CO2: 25 mmol/L (ref 22–32)
Calcium: 9.5 mg/dL (ref 8.9–10.3)
Chloride: 110 mmol/L (ref 98–111)
Creatinine, Ser: 0.8 mg/dL (ref 0.44–1.00)
GFR, Estimated: >60 mL/min (ref >60)
Glucose, Bld: 97 mg/dL (ref 70–99)
Potassium: 3.2 mmol/L — ABNORMAL LOW (ref 3.5–5.1)
Sodium: 144 mmol/L (ref 135–145)

## 2022-07-15 LAB — GLUCOSE, CAPILLARY
Glucose-Capillary: 107 mg/dL — ABNORMAL HIGH (ref 70–99)
Glucose-Capillary: 116 mg/dL — ABNORMAL HIGH (ref 70–99)
Glucose-Capillary: 195 mg/dL — ABNORMAL HIGH (ref 70–99)
Glucose-Capillary: 124 mg/dL — ABNORMAL HIGH (ref 70–99)

## 2022-07-15 LAB — CBC WITH DIFFERENTIAL/PLATELET
Abs Immature Granulocytes: 0.05 10*3/uL (ref 0.00–0.07)
Basophils Absolute: 0.1 10*3/uL (ref 0.0–0.1)
Basophils Relative: 1 %
Eosinophils Absolute: 0.1 10*3/uL (ref 0.0–0.5)
Eosinophils Relative: 1 %
HCT: 37.5 % (ref 36.0–46.0)
Hemoglobin: 12.3 g/dL (ref 12.0–15.0)
Immature Granulocytes: 0 %
Lymphocytes Relative: 16 %
Lymphs Abs: 1.9 10*3/uL (ref 0.7–4.0)
MCH: 28.9 pg (ref 26.0–34.0)
MCHC: 32.8 g/dL (ref 30.0–36.0)
MCV: 88.2 fL (ref 80.0–100.0)
Monocytes Absolute: 1.4 10*3/uL — ABNORMAL HIGH (ref 0.1–1.0)
Monocytes Relative: 12 %
Neutro Abs: 8.4 10*3/uL — ABNORMAL HIGH (ref 1.7–7.7)
Neutrophils Relative %: 70 %
Platelets: 313 10*3/uL (ref 150–400)
RBC: 4.25 MIL/uL (ref 3.87–5.11)
RDW: 13.3 % (ref 11.5–15.5)
WBC: 12 10*3/uL — ABNORMAL HIGH (ref 4.0–10.5)
nRBC: 0 % (ref 0.0–0.2)

## 2022-07-15 MED ORDER — ORAL CARE MOUTH RINSE: 15.0000 mL | OROMUCOSAL | Status: DC | PRN

## 2022-07-15 MED ORDER — POTASSIUM CHLORIDE 10 MEQ/100ML IV SOLN: 10.0000 meq | INTRAVENOUS | Status: DC

## 2022-07-15 MED ORDER — POTASSIUM CHLORIDE 10 MEQ/100ML IV SOLN
10.0000 meq | INTRAVENOUS | Status: AC
  Administered 2022-07-15 (×4): 10 meq via INTRAVENOUS
  Filled 2022-07-15 (×3): qty 100

## 2022-07-15 MED ORDER — ORAL CARE MOUTH RINSE
15.0000 mL | OROMUCOSAL | Status: DC
  Administered 2022-07-15 – 2022-07-16 (×4): 15 mL via OROMUCOSAL

## 2022-07-15 NOTE — Plan of Care (Signed)
  Problem: Education: Goal: Knowledge of General Education information will improve Description: Including pain rating scale, medication(s)/side effects and non-pharmacologic comfort measures Outcome: Progressing   Problem: Health Behavior/Discharge Planning: Goal: Ability to manage health-related needs will improve Outcome: Not Progressing   Problem: Clinical Measurements: Goal: Ability to maintain clinical measurements within normal limits will improve Outcome: Progressing Goal: Will remain free from infection Outcome: Progressing Goal: Diagnostic test results will improve Outcome: Progressing Goal: Respiratory complications will improve Outcome: Progressing Goal: Cardiovascular complication will be avoided Outcome: Progressing   Problem: Activity: Goal: Risk for activity intolerance will decrease Outcome: Not Progressing   Problem: Nutrition: Goal: Adequate nutrition will be maintained Outcome: Progressing   Problem: Coping: Goal: Level of anxiety will decrease Outcome: Progressing   Problem: Elimination: Goal: Will not experience complications related to bowel motility Outcome: Progressing Goal: Will not experience complications related to urinary retention Outcome: Not Progressing   Problem: Pain Managment: Goal: General experience of comfort will improve Outcome: Progressing   Problem: Safety: Goal: Ability to remain free from injury will improve Outcome: Progressing   Problem: Skin Integrity: Goal: Risk for impaired skin integrity will decrease Outcome: Progressing   Problem: Education: Goal: Ability to describe self-care measures that may prevent or decrease complications (Diabetes Survival Skills Education) will improve Outcome: Progressing Goal: Individualized Educational Video(s) Outcome: Progressing   Problem: Coping: Goal: Ability to adjust to condition or change in health will improve Outcome: Progressing   Problem: Health Behavior/Discharge  Planning: Goal: Ability to identify and utilize available resources and services will improve Outcome: Progressing Goal: Ability to manage health-related needs will improve Outcome: Progressing   Problem: Metabolic: Goal: Ability to maintain appropriate glucose levels will improve Outcome: Progressing   Problem: Nutritional: Goal: Maintenance of adequate nutrition will improve Outcome: Progressing Goal: Progress toward achieving an optimal weight will improve Outcome: Progressing   Problem: Skin Integrity: Goal: Risk for impaired skin integrity will decrease Outcome: Progressing   Problem: Tissue Perfusion: Goal: Adequacy of tissue perfusion will improve Outcome: Progressing

## 2022-07-15 NOTE — Progress Notes (Signed)
PIV removed per patient request due to discomfort.  Per provider, PIV can be removed and left out due to pending discharge tomorrow.

## 2022-07-15 NOTE — Progress Notes (Signed)
Patient and patients family at bedside informed of discharge to SNF tomorrow instead of today due to pharmacy closed on weekends per provider.  Patient and her family verbalized understanding.

## 2022-07-15 NOTE — Progress Notes (Signed)
Pt's bladder scan showed 258 mls. Farrel Gordon, MD was notified, no new orders for now. Will continue to monitor and bladder scan pt as ordered

## 2022-07-15 NOTE — Progress Notes (Signed)
HD#7 Subjective:   Summary: Catherine [name] is a 81 year old female with PMH of Hx of mulitple SI attempts, anxiety, depression, CAD, AS s/p TAVR, diabetes, CVA, HFpEF, hypothyroidism, RA, complete heart block with pacemaker in 2016 who presents after fall and noted facial droop, right-sided weakness and dysarthria, admitted for CVA work-up versus mechanical fall and found to be in benzodiazapine withdrawal.   Overnight Events: No acute events overnight.   Resting in bed comfortably with daughter Catherine [name] at bedside. Patient's mentation slowly improving. Patient denies being in pain or discomfort. Plan to discharge tomorrow when SNF facility pharmacy is open.   Objective:  Vital signs in last 24 hours: Vitals:   07/15/22 0323 07/15/22 0443 07/15/22 0803 07/15/22 1212  BP: (!) 141/63  (!) 100/53 117/66  Pulse: 88  78 77  Resp: '20  16 20  '$ Temp: 98.7 F (37.1 C)  97.6 F (36.4 C) 98.3 F (36.8 C)  TempSrc: Oral  Oral Oral  SpO2: 98%  99% 99%  Weight:  63.7 kg     Supplemental O2: Room Air SpO2: 99 %   Physical Exam:  Constitutional: no acute distress HENT: normocephalic atraumatic Eyes: conjunctiva non-erythematous Neck: supple Cardiovascular: regular rate Pulmonary/Chest: normal work of breathing on room air MSK: normal bulk and tone Neurological: alert & oriented x to person, place, and time. Not oriented to situation Skin: warm and dry Psych: normal mood.     Intake/Output Summary (Last 24 hours) at 07/15/2022 1231 Last data filed at 07/15/2022 0800 Gross per 24 hour  Intake 280 ml  Output 0 ml  Net 280 ml    Net IO Since Admission: -1,623.42 mL [07/15/22 1231]  Pertinent Labs:    Latest Ref Rng & Units 07/15/2022    3:34 AM 07/14/2022    3:30 AM 07/13/2022    7:39 AM  CBC  WBC 4.0 - 10.5 K/uL 12.0  10.5  15.1   Hemoglobin 12.0 - 15.0 g/dL 12.3  11.9  13.0   Hematocrit 36.0 - 46.0 % 37.5  37.6  40.5   Platelets 150 - 400 K/uL 313  299  333        Latest  Ref Rng & Units 07/15/2022    3:34 AM 07/14/2022    3:30 AM 07/13/2022    7:39 AM  CMP  Glucose 70 - 99 mg/dL 97  125  125   BUN 8 - 23 mg/dL 32  29  30   Creatinine 0.44 - 1.00 mg/dL 0.80  0.75  0.90   Sodium 135 - 145 mmol/L 144  141  134   Potassium 3.5 - 5.1 mmol/L 3.2  3.5  3.6   Chloride 98 - 111 mmol/L 110  108  101   CO2 22 - 32 mmol/L '25  23  22   '$ Calcium 8.9 - 10.3 mg/dL 9.5  9.5  9.5     Assessment/Plan:   Principal Problem:   Stroke-like symptoms Active Problems:   Type II diabetes mellitus (HCC)   Essential hypertension   Hypothyroidism   Abdominal pain   Depression   CAD (coronary artery disease)  Patient Summary: [name] is a 81 year old female with PMH of Hx of mulitple SI attempts, anxiety, depression, CAD, AS s/p TAVR, diabetes, CVA, HFpEF, hypothyroidism, RA, complete heart block with pacemaker in 2016 who presents after fall and noted facial droop, right-sided weakness and dysarthria, admitted for CVA work-up versus mechanical fall and found to be in benzodiazapine withdrawal.   Polypharmacy  Benzodiazepine/Tramadol withdrawal  Mentation improving and asphasia resolved since restarting tramadol and klonipin. Likely due to taking extra doses. Believe she may also have some hospital delirium and will be better suited at SNF and closer to home. Daughter and patient in agreement with this.  -continue klonipin.5 mg and traodone 50 mg QHS -follow up outpatient psychiatry  UTI Cystocerebral Syndrome Overnight 200cc's on bladder scan. Will continue frequent scans and place foley if over 400cc's post void.  -last dose of ceftriaxone today, day 3/3 -continue bladder scans and place foley if continued retention  Stroke-like symptoms Presented with weakness, confusion, right sided weakness, facial droop and slurred speech. CT imaging without evidence of acute infarct or hemmorrhage. Neurology have signed off. Unable to perform MRI with pacemaker. Symptoms have  resolved -continue plavix/aspirin daily for 21 days then plavix alone for life (DAPT end 07/29/22)  Electrolyte abnormalities Replenish daily as needed  Anxiety/Depression Continue sertraline and trazodone  Sacral Wound Continue wound care management Offload and role multiple times per day  Hx of complete heart block s/p pacemaker Continue metoprolol for rate control  HTN CAD (70% LAD disease and mild RCA) -continue aspirin, atorvastatin, losartan daily  Diet:  dysphagia 1 IVF: None,None VTE: xarelto 10 mg Code: Full PT/OT recs: SNF. TOC recs: Pending placement to SNF Family Update: daughter at bedside today   Dispo: Anticipated discharge to [facility] Resident PGY-3 Please contact the on call pager after 5 pm and on weekends at [phone].

## 2022-07-15 NOTE — Progress Notes (Signed)
Intermittent straight cath performed. Urine output 300 cc, amber colored.

## 2022-07-15 NOTE — Progress Notes (Addendum)
Patient's insurance authorization has been approved through 07/17/2022.  CSW spoke with Tammy at [facility] who states the patient cannot be accepted into the facility today due to the pharmacy not being open. Tammy states the patient can be transferred to the facility on Monday 8/14.  Madilyn Fireman, MSW, LCSW Transitions of Care  Clinical Social Worker II [phone]

## 2022-07-16 ENCOUNTER — Other Ambulatory Visit (HOSPITAL_COMMUNITY): Payer: Self-pay

## 2022-07-16 DIAGNOSIS — R300 Dysuria: Secondary | ICD-10-CM | POA: Diagnosis not present

## 2022-07-16 DIAGNOSIS — Z741 Need for assistance with personal care: Secondary | ICD-10-CM | POA: Diagnosis not present

## 2022-07-16 DIAGNOSIS — R498 Other voice and resonance disorders: Secondary | ICD-10-CM | POA: Diagnosis not present

## 2022-07-16 DIAGNOSIS — K5521 Angiodysplasia of colon with hemorrhage: Secondary | ICD-10-CM | POA: Diagnosis not present

## 2022-07-16 DIAGNOSIS — R338 Other retention of urine: Secondary | ICD-10-CM | POA: Diagnosis not present

## 2022-07-16 DIAGNOSIS — L8992 Pressure ulcer of unspecified site, stage 2: Secondary | ICD-10-CM | POA: Diagnosis not present

## 2022-07-16 DIAGNOSIS — Z20822 Contact with and (suspected) exposure to covid-19: Secondary | ICD-10-CM | POA: Diagnosis not present

## 2022-07-16 DIAGNOSIS — I499 Cardiac arrhythmia, unspecified: Secondary | ICD-10-CM | POA: Diagnosis not present

## 2022-07-16 DIAGNOSIS — K922 Gastrointestinal hemorrhage, unspecified: Secondary | ICD-10-CM | POA: Diagnosis not present

## 2022-07-16 DIAGNOSIS — I2510 Atherosclerotic heart disease of native coronary artery without angina pectoris: Secondary | ICD-10-CM | POA: Diagnosis not present

## 2022-07-16 DIAGNOSIS — M6281 Muscle weakness (generalized): Secondary | ICD-10-CM | POA: Diagnosis not present

## 2022-07-16 DIAGNOSIS — F419 Anxiety disorder, unspecified: Secondary | ICD-10-CM | POA: Diagnosis present

## 2022-07-16 DIAGNOSIS — M069 Rheumatoid arthritis, unspecified: Secondary | ICD-10-CM | POA: Diagnosis not present

## 2022-07-16 DIAGNOSIS — K581 Irritable bowel syndrome with constipation: Secondary | ICD-10-CM | POA: Diagnosis not present

## 2022-07-16 DIAGNOSIS — G8929 Other chronic pain: Secondary | ICD-10-CM | POA: Diagnosis not present

## 2022-07-16 DIAGNOSIS — G459 Transient cerebral ischemic attack, unspecified: Secondary | ICD-10-CM | POA: Diagnosis not present

## 2022-07-16 DIAGNOSIS — R339 Retention of urine, unspecified: Secondary | ICD-10-CM

## 2022-07-16 DIAGNOSIS — Z79899 Other long term (current) drug therapy: Secondary | ICD-10-CM | POA: Diagnosis not present

## 2022-07-16 DIAGNOSIS — R1032 Left lower quadrant pain: Secondary | ICD-10-CM | POA: Diagnosis not present

## 2022-07-16 DIAGNOSIS — E876 Hypokalemia: Secondary | ICD-10-CM | POA: Diagnosis not present

## 2022-07-16 DIAGNOSIS — N390 Urinary tract infection, site not specified: Secondary | ICD-10-CM | POA: Diagnosis not present

## 2022-07-16 DIAGNOSIS — I5032 Chronic diastolic (congestive) heart failure: Secondary | ICD-10-CM | POA: Diagnosis not present

## 2022-07-16 DIAGNOSIS — R1010 Upper abdominal pain, unspecified: Secondary | ICD-10-CM | POA: Diagnosis not present

## 2022-07-16 DIAGNOSIS — E039 Hypothyroidism, unspecified: Secondary | ICD-10-CM | POA: Diagnosis not present

## 2022-07-16 DIAGNOSIS — I700 Atherosclerosis of aorta: Secondary | ICD-10-CM | POA: Diagnosis not present

## 2022-07-16 DIAGNOSIS — I10 Essential (primary) hypertension: Secondary | ICD-10-CM | POA: Diagnosis not present

## 2022-07-16 DIAGNOSIS — E038 Other specified hypothyroidism: Secondary | ICD-10-CM | POA: Diagnosis not present

## 2022-07-16 DIAGNOSIS — G9341 Metabolic encephalopathy: Secondary | ICD-10-CM | POA: Diagnosis present

## 2022-07-16 DIAGNOSIS — Z7401 Bed confinement status: Secondary | ICD-10-CM | POA: Diagnosis not present

## 2022-07-16 DIAGNOSIS — Z743 Need for continuous supervision: Secondary | ICD-10-CM | POA: Diagnosis not present

## 2022-07-16 DIAGNOSIS — R109 Unspecified abdominal pain: Secondary | ICD-10-CM | POA: Diagnosis not present

## 2022-07-16 DIAGNOSIS — F32A Depression, unspecified: Secondary | ICD-10-CM | POA: Diagnosis not present

## 2022-07-16 DIAGNOSIS — Z950 Presence of cardiac pacemaker: Secondary | ICD-10-CM | POA: Diagnosis not present

## 2022-07-16 DIAGNOSIS — K5730 Diverticulosis of large intestine without perforation or abscess without bleeding: Secondary | ICD-10-CM | POA: Diagnosis not present

## 2022-07-16 DIAGNOSIS — R1312 Dysphagia, oropharyngeal phase: Secondary | ICD-10-CM | POA: Diagnosis not present

## 2022-07-16 DIAGNOSIS — M797 Fibromyalgia: Secondary | ICD-10-CM | POA: Diagnosis not present

## 2022-07-16 DIAGNOSIS — E569 Vitamin deficiency, unspecified: Secondary | ICD-10-CM | POA: Diagnosis not present

## 2022-07-16 DIAGNOSIS — Z6825 Body mass index (BMI) 25.0-25.9, adult: Secondary | ICD-10-CM | POA: Diagnosis not present

## 2022-07-16 DIAGNOSIS — W19XXXD Unspecified fall, subsequent encounter: Secondary | ICD-10-CM | POA: Diagnosis not present

## 2022-07-16 DIAGNOSIS — K635 Polyp of colon: Secondary | ICD-10-CM | POA: Diagnosis not present

## 2022-07-16 DIAGNOSIS — R4701 Aphasia: Secondary | ICD-10-CM | POA: Diagnosis not present

## 2022-07-16 DIAGNOSIS — Z953 Presence of xenogenic heart valve: Secondary | ICD-10-CM | POA: Diagnosis not present

## 2022-07-16 DIAGNOSIS — M6259 Muscle wasting and atrophy, not elsewhere classified, multiple sites: Secondary | ICD-10-CM | POA: Diagnosis not present

## 2022-07-16 DIAGNOSIS — D62 Acute posthemorrhagic anemia: Secondary | ICD-10-CM | POA: Diagnosis not present

## 2022-07-16 DIAGNOSIS — R488 Other symbolic dysfunctions: Secondary | ICD-10-CM | POA: Diagnosis not present

## 2022-07-16 DIAGNOSIS — I442 Atrioventricular block, complete: Secondary | ICD-10-CM | POA: Diagnosis not present

## 2022-07-16 DIAGNOSIS — E440 Moderate protein-calorie malnutrition: Secondary | ICD-10-CM | POA: Diagnosis not present

## 2022-07-16 DIAGNOSIS — I110 Hypertensive heart disease with heart failure: Secondary | ICD-10-CM | POA: Diagnosis not present

## 2022-07-16 DIAGNOSIS — E785 Hyperlipidemia, unspecified: Secondary | ICD-10-CM | POA: Diagnosis not present

## 2022-07-16 DIAGNOSIS — R2990 Unspecified symptoms and signs involving the nervous system: Secondary | ICD-10-CM | POA: Diagnosis not present

## 2022-07-16 DIAGNOSIS — Z8673 Personal history of transient ischemic attack (TIA), and cerebral infarction without residual deficits: Secondary | ICD-10-CM | POA: Diagnosis not present

## 2022-07-16 DIAGNOSIS — E7800 Pure hypercholesterolemia, unspecified: Secondary | ICD-10-CM | POA: Diagnosis not present

## 2022-07-16 DIAGNOSIS — E119 Type 2 diabetes mellitus without complications: Secondary | ICD-10-CM | POA: Diagnosis not present

## 2022-07-16 DIAGNOSIS — R531 Weakness: Secondary | ICD-10-CM | POA: Diagnosis not present

## 2022-07-16 DIAGNOSIS — K921 Melena: Secondary | ICD-10-CM | POA: Diagnosis not present

## 2022-07-16 LAB — CULTURE, BLOOD (ROUTINE X 2)
Culture: NO GROWTH
Culture: NO GROWTH
Special Requests: ADEQUATE

## 2022-07-16 LAB — BASIC METABOLIC PANEL
Anion gap: 8 (ref 5–15)
BUN: 24 mg/dL — ABNORMAL HIGH (ref 8–23)
CO2: 23 mmol/L (ref 22–32)
Calcium: 8.7 mg/dL — ABNORMAL LOW (ref 8.9–10.3)
Chloride: 106 mmol/L (ref 98–111)
Creatinine, Ser: 0.68 mg/dL (ref 0.44–1.00)
GFR, Estimated: >60 mL/min (ref >60)
Glucose, Bld: 114 mg/dL — ABNORMAL HIGH (ref 70–99)
Potassium: 3.4 mmol/L — ABNORMAL LOW (ref 3.5–5.1)
Sodium: 137 mmol/L (ref 135–145)

## 2022-07-16 LAB — GLUCOSE, CAPILLARY
Glucose-Capillary: 114 mg/dL — ABNORMAL HIGH (ref 70–99)
Glucose-Capillary: 129 mg/dL — ABNORMAL HIGH (ref 70–99)
Glucose-Capillary: 146 mg/dL — ABNORMAL HIGH (ref 70–99)

## 2022-07-16 MED ORDER — POTASSIUM CHLORIDE 20 MEQ PO PACK
40.0000 meq | PACK | Freq: Once | ORAL | Status: AC
  Administered 2022-07-16: 40 meq via ORAL
  Filled 2022-07-16: qty 2

## 2022-07-16 MED ORDER — POTASSIUM CHLORIDE 10 MEQ/100ML IV SOLN
10.0000 meq | INTRAVENOUS | Status: DC
  Filled 2022-07-16 (×4): qty 100

## 2022-07-16 MED ORDER — ZINC OXIDE 12.8 % EX OINT
TOPICAL_OINTMENT | Freq: Four times a day (QID) | CUTANEOUS | 0 refills | Status: AC
  Filled 2022-07-16: qty 56.7, fill #0

## 2022-07-16 MED ORDER — METOPROLOL TARTRATE 25 MG PO TABS
12.5000 mg | ORAL_TABLET | Freq: Two times a day (BID) | ORAL | 0 refills | Status: DC
  Filled 2022-07-16: qty 30, 30d supply, fill #0

## 2022-07-16 MED ORDER — LOSARTAN POTASSIUM 25 MG PO TABS
12.5000 mg | ORAL_TABLET | Freq: Every day | ORAL | 0 refills | Status: DC
  Filled 2022-07-16: qty 15, 30d supply, fill #0

## 2022-07-16 MED ORDER — LEVOTHYROXINE SODIUM 100 MCG PO TABS
100.0000 ug | ORAL_TABLET | Freq: Every day | ORAL | 0 refills | Status: DC
  Filled 2022-07-16: qty 30, 30d supply, fill #0

## 2022-07-16 MED ORDER — POLYETHYLENE GLYCOL 3350 17 GM/SCOOP PO POWD
17.0000 g | Freq: Every day | ORAL | 0 refills | Status: AC
  Filled 2022-07-16: qty 238, 14d supply, fill #0

## 2022-07-16 MED ORDER — CLOPIDOGREL BISULFATE 75 MG PO TABS
75.0000 mg | ORAL_TABLET | Freq: Every day | ORAL | 0 refills | Status: DC
  Filled 2022-07-16: qty 30, 30d supply, fill #0

## 2022-07-16 MED ORDER — POLYETHYLENE GLYCOL 3350 17 G PO PACK
17.0000 g | PACK | Freq: Every day | ORAL | Status: DC
  Administered 2022-07-16: 17 g via ORAL
  Filled 2022-07-16: qty 1

## 2022-07-16 MED ORDER — ALUM & MAG HYDROXIDE-SIMETH 200-200-20 MG/5ML PO SUSP
15.0000 mL | Freq: Once | ORAL | Status: AC
  Administered 2022-07-16: 15 mL via ORAL
  Filled 2022-07-16: qty 30

## 2022-07-16 MED ORDER — ATORVASTATIN CALCIUM 80 MG PO TABS
80.0000 mg | ORAL_TABLET | Freq: Every day | ORAL | 0 refills | Status: DC
  Filled 2022-07-16: qty 30, 30d supply, fill #0

## 2022-07-16 MED ORDER — POTASSIUM CHLORIDE CRYS ER 20 MEQ PO TBCR
40.0000 meq | EXTENDED_RELEASE_TABLET | Freq: Once | ORAL | Status: DC
Start: 2022-07-16 — End: 2022-07-16
  Filled 2022-07-16: qty 2

## 2022-07-16 NOTE — Progress Notes (Signed)
Physical Therapy Treatment Patient Details Name: Catherine [name] MRN: [id number] DOB: [birth year] Today's Date: 07/16/2022   History of Present Illness Patient is a 81 yo female admitted to ED on 07/08/22 with stroke like symptoms. CT negative, no emergent finding with CTA and CTP.  Patient does have extensive intracranial atherosclerosis with bilateral high-grade PCA stenosis and moderate right M1 stenosis. Pt with AMS and aphasic changes on 8/9, per neurology suspect encephalopathy but cannot rule out CVA (MRI unable to be obtained due to pacemaker). PMH includes: CAD, CHF, RA, DM2, HTN and heart block with PPM    PT Comments    Pt received in supine, agreeable to therapy session with encouragement, pt c/o nausea, dizziness with transfers to/from [facility] and with elevated HR/RR however BP fairly stable. VSS once returned to supine, RN notified. Pt needing up to min/modA with max cues for sequencing and increased time to perform functional mobility tasks at bedside using RW. Pt with BUE tremors and anxious affect throughout. Pt continues to benefit from PT services to progress toward functional mobility goals. Transport needs updated below as pt more confused with poor carryover of cues today, discussed with supervising PT Alexa S.  Recommendations for follow up therapy are one component of a multi-disciplinary discharge planning process, led by the attending physician.  Recommendations may be updated based on patient status, additional functional criteria and insurance authorization.  Follow Up Recommendations  Acute inpatient rehab (3hours/day) (vs ST-SNF if AIR declines) Can patient physically be transported by private vehicle: No   Assistance Recommended at Discharge    Patient can return home with the following A little help with walking and/or transfers;A little help with bathing/dressing/bathroom;Assistance with cooking/housework;Direct supervision/assist for medications management;Direct  supervision/assist for financial management;Assist for transportation;Help with stairs or ramp for entrance   Equipment Recommendations  None recommended by PT    Recommendations for Other Services       Precautions / Restrictions Precautions Precautions: Fall Precaution Comments: watch HR and BP Restrictions Weight Bearing Restrictions: No     Mobility  Bed Mobility Overal bed mobility: Needs Assistance Bed Mobility: Rolling, Sidelying to Sit, Sit to Sidelying Rolling: Min assist Sidelying to sit: Mod assist, HOB elevated     Sit to sidelying: Min assist General bed mobility comments: assist for LE progression to EOB, trunk elevation, and scooting to EOB.    Transfers Overall transfer level: Needs assistance Equipment used: Rolling walker (2 wheels) Transfers: Sit to/from Stand, Bed to chair/wheelchair/BSC Sit to Stand: Min assist, From elevated surface   Step pivot transfers: Min assist, Mod assist, From elevated surface       General transfer comment: posterior bias and pt tending to attempt sitting prior to reaching proximity to next surface, needing max multimodal cues. Pt needs hand over hand assist for safety/sequencing and RW mgmt. ModA for seated lateral scooting with bed pad assist.        Balance Overall balance assessment: Needs assistance Sitting-balance support: Feet supported Sitting balance-Leahy Scale: Fair Sitting balance - Comments: needs min guard to minA, more due to impulsivity/behaviors with fatigue causing balance deficit. Postural control: Posterior lean Standing balance support: Bilateral upper extremity supported, During functional activity Standing balance-Leahy Scale: Poor Standing balance comment: reliant on external support of PTA and RW                            Cognition Arousal/Alertness: Awake/alert Behavior During Therapy: Impulsive, Anxious Overall Cognitive Status:  Impaired/Different from baseline Area of  Impairment: Orientation, Memory, Following commands, Safety/judgement, Attention, Awareness, Problem solving                 Orientation Level: Disoriented to, Time, Situation, Place Current Attention Level: Focused Memory: Decreased recall of precautions, Decreased short-term memory Following Commands: Follows one step commands inconsistently, Follows one step commands with increased time Safety/Judgement: Decreased awareness of safety Awareness: Intellectual Problem Solving: Requires verbal cues, Requires tactile cues, Decreased initiation, Difficulty sequencing, Slow processing General Comments: Max multimodal cues for safety/sequencing, hand over hand assist frequently needed.        Exercises Other Exercises Other Exercises: seated BLE AAROM: ankle pumps, hip flexion, LAQ x10 reps ea needs consistent cues due to cognition    General Comments General comments (skin integrity, edema, etc.): BP 142/80 (89) seated on BSC, HR to >115 and c/o nausea/dizziness; BP 155/80 (98) after return to supine; HR back to 98 bpm resting; RR up to 39 RPM with exertion, however SpO2 WFL throughout.      Pertinent Vitals/Pain Pain Assessment Pain Assessment: Faces Faces Pain Scale: Hurts even more Pain Location: generalized, possibly increased with peri-care Pain Descriptors / Indicators: Grimacing, Discomfort Pain Intervention(s): Limited activity within patient's tolerance, Monitored during session, Repositioned, Other (comment) (RN notified pt discomfort with toileting/wiping may need another sacral foam.)           PT Goals (current goals can now be found in the care plan section) Acute Rehab PT Goals PT Goal Formulation: With patient Time For Goal Achievement: 07/23/22 Progress towards PT goals: Progressing toward goals    Frequency    Min 3X/week      PT Plan Current plan remains appropriate       AM-PAC PT "6 Clicks" Mobility   Outcome Measure  Help needed turning  from your back to your side while in a flat bed without using bedrails?: A Little Help needed moving from lying on your back to sitting on the side of a flat bed without using bedrails?: A Lot Help needed moving to and from a bed to a chair (including a wheelchair)?: A Lot Help needed standing up from a chair using your arms (e.g., wheelchair or bedside chair)?: A Lot Help needed to walk in hospital room?: Total Help needed climbing 3-5 steps with a railing? : Total 6 Click Score: 11    End of Session Equipment Utilized During Treatment: Gait belt Activity Tolerance: Patient limited by fatigue;Other (comment) (c/o nausea and dizziness) Patient left: in bed;with call bell/phone within reach;with bed alarm set;with SCD's reapplied;with family/visitor present (pillow under L hip and BLE to offload heels and low back) Nurse Communication: Mobility status;Precautions;Other (comment) (nausea/mild constipation/fatigue) PT Visit Diagnosis: Unsteadiness on feet (R26.81);Muscle weakness (generalized) (M62.81);Hemiplegia and hemiparesis;Adult, failure to thrive (R62.7) Hemiplegia - Right/Left: Right Hemiplegia - dominant/non-dominant: Dominant     Time: 0867-6195 PT Time Calculation (min) (ACUTE ONLY): 28 min  Charges:  $Therapeutic Exercise: 8-22 mins $Therapeutic Activity: 8-22 mins                     Meli Faley P., PTA Acute Rehabilitation Services Secure Chat Preferred 9a-5:30pm Office: [place] 07/16/2022, 3:19 PM

## 2022-07-16 NOTE — Discharge Instructions (Addendum)
You were hospitalized for a recent fall and concern for stroke given the one-sided weakness. Neurology evaluated you and recommended increasing atorvastatin and starting aspirin and plavix. Over the hospital stay, your mental status worsened which we were concerned about what was causing it. You had urinary retention which we placed a foley catheter.  You were found to have a urinary tract infection which we treated with 3 days of antibiotics. Your mental status improved with restarting your home klonopin and trazodone. We started metoprolol for your high heart rate. We restarted your losartan at lower dose and your primary care and decide if you need the original dose. Your TSH level was low so we adjusted your levothyroxine dose.   You will be going to [facility] to help recover and improve. Thank you for allowing [place] to be part of your care.   Please note these changes made to your medications:   Please START taking:  -Atorvastatin 80 mg daily -Levothyroxine 100 mcg daily -Losartan 12.5 mg daily -Metoprolol 12.5 mg twice daily -Aspirin 81 mg daily and Plavix 75 mg daily until 07/29/22. Then you will continue with only Plavix 75 mg daily.    Please make sure to follow up with Neurology at their office.   Please call our clinic if you have any questions or concerns, we may be able to help and keep you from a long and expensive emergency room wait. Our clinic and after hours phone number is [phone], the best time to call is Monday through Friday 9 am to 4 pm but there is always someone available 24/7 if you have an emergency. If you need medication refills please notify your pharmacy one week in advance and they will send [place] a request.

## 2022-07-16 NOTE — Progress Notes (Addendum)
Bladder scanned patient volume of greater than 450m. Per MD order foley was placed. Patient tolerated it well.

## 2022-07-16 NOTE — Discharge Summary (Addendum)
Name: CARO BRUNDIDGE MRN: [id number] DOB: November 10, 1941 81 y.o. PCP: Danelle Berry, NP  Date of Admission: 07/08/2022  9:15 AM Date of Discharge: 07/16/2022 Attending Physician: Dr. Angelia Mould  Discharge Diagnosis: Principal Problem:   Stroke-like symptoms Active Problems:   Type II diabetes mellitus ([place])   Essential hypertension   Hypothyroidism   Abdominal pain   Depression   CAD (coronary artery disease)   Urine retention    Discharge Medications: Allergies as of 07/16/2022       Reactions   Tetracyclines & Related Anaphylaxis, Rash   Sulfa Antibiotics Hives   Latex Rash        Medication List     STOP taking these medications    cephALEXin 500 MG capsule Commonly known as: KEFLEX   ondansetron 4 MG tablet Commonly known as: Zofran       TAKE these medications    acetaminophen 325 MG tablet Commonly known as: Tylenol Take 2 tablets (650 mg total) by mouth every 4 (four) hours as needed.   aspirin EC 81 MG tablet Take 1 tablet (81 mg total) by mouth daily. Swallow whole.   atorvastatin 80 MG tablet Commonly known as: LIPITOR Take 1 tablet (80 mg total) by mouth daily. Start taking on: July 17, 2022 What changed:  medication strength how much to take   clonazePAM 0.5 MG tablet Commonly known as: KLONOPIN Take 1 tablet (0.5 mg total) by mouth at bedtime.   clopidogrel 75 MG tablet Commonly known as: PLAVIX Take 1 tablet (75 mg total) by mouth daily. Start taking on: July 17, 2022   isosorbide mononitrate 30 MG 24 hr tablet Commonly known as: IMDUR TAKE 1/2 OF A TABLET (15 MG TOTAL) BY MOUTH DAILY What changed: See the new instructions.   levothyroxine 100 MCG tablet Commonly known as: SYNTHROID Take 1 tablet (100 mcg total) by mouth daily at 6 (six) AM. Start taking on: July 17, 2022 What changed:  medication strength how much to take when to take this   losartan 25 MG tablet Commonly known as: COZAAR Take 1/2 tablet (12.5  mg total) by mouth daily. Start taking on: July 17, 2022 What changed: how much to take   metoprolol tartrate 25 MG tablet Commonly known as: LOPRESSOR Take 1/2 tablet (12.5 mg total) by mouth 2 (two) times daily.   multivitamin with minerals Tabs tablet Take 1 tablet by mouth daily.   nitroGLYCERIN 0.4 MG SL tablet Commonly known as: NITROSTAT Place 1 tablet (0.4 mg total) under the tongue every 5 (five) minutes as needed for chest pain.   polyethylene glycol powder 17 GM/SCOOP powder Commonly known as: GLYCOLAX/MIRALAX Take 17 g by mouth daily.   potassium chloride 10 MEQ tablet Commonly known as: KLOR-CON TAKE 1 TABLET BY MOUTH EVERY OTHER DAY   sertraline 50 MG tablet Commonly known as: ZOLOFT Take 2.5 tablets (125 mg total) by mouth daily.   traZODone 50 MG tablet Commonly known as: DESYREL Take 50 mg by mouth at bedtime.   Zinc Oxide 12.8 % ointment Commonly known as: TRIPLE PASTE Apply topically 4 (four) times daily.         Disposition and follow-up:   Ms.Eron C Rakers was discharged from [facility] in Good condition.  At the hospital follow up visit please address:  1.  Follow-up:  a. Acute metabolic encephalopathy secondary to benzodiazepine withdrawal, UTI, urinary retention: re-evaluate for symptoms    b. Aspirin and Plavix until 07/29/22 then  continue Plavix     c. Hypothyroidism: repeat TSH and adjust levothyroxine if needed   d. Hypokalemia: repeat BMP   E. Sacral wound: re-evaluate wound status    2.  Labs / imaging needed at time of follow-up: BMP, CBC, TSH  3.  Pending labs/ test needing follow-up: none  4.  Medication Changes  -Aspirin 81 mg daily until 07/29/22  -Plavix 75 mg daily   -Atorvastatin: increased to 80 mg daily  -Metoprolol: 12.5 mg twice daily  -Losartan: 12.5 mg daily  -Levothyroxine: 100 mcg daily  Follow-up Appointments:  Contact information for follow-up providers     [facility]. Schedule an appointment as soon as possible for a visit in 1 month(s).   Specialty: Neurology Why: stroke clinic Contact information: [place] [phone]             Contact information for after-discharge care     Destination     [facility] [facility] Preferred SNF .   Service: Skilled Nursing Contact information: [address] [place] Hospital Course by problem list: Acute encephalopathy secondary to benzodiazepine/trazodone withdrawal, UTI, urinary retention Patient presented after fall and right sided weakness. She was noted to have dysarthria. CVA workup at hospital stay and neurology consulted. CT head was negative. Unable to obtain MRI due to incompatibility with pacemaker. Patient reports poor appetite and extensive chronic conditions leading to health decline. No focal neuro deficits noted. A1c was 5.4%. LDL 112. CK normal. Ammonia normal. She became confused and disoriented over her hospital stay. Developed resting tremor and muscle rigidity in her UE. We restarted her Klonopin 0.5 mg and noted drastic improvement. Alert and oriented x 3 following Klonopin administration. The following day, she was oriented x 3 and no longer had obvious tremors or rigidity. Concern that her mental status change and physical signs were from benzodiazepine withdrawal. Per family, she doubled her dose a week prior to hospital admission. Ran out 3 days before arriving to the ED. Continued her Klonopin and trazodone nightly. She is medically stable for discharge to SNF.   UTI Concern for cystocerebral syndrome Patient retaining urine and required foley catheter. Urine culture grew Klebsiella pneumoniae 20,000 colonies. Given her confusion early in her hospital course, she was treated with rocephin for 3 days. Her urinary retention could have contributed to  her delirious state.   Strokelike symptoms Recent Fall Patient presented with weakness, confusion, right sided weakness, facial droop and slurred speech. CT imaging without evidence of acute infarct or hemmorrhage. Unable to perform MRI with pacemaker. Symptoms have resolved during hospital stay. Neurology thinks more encephalopathy than CVA. Recommended starting on aspirin 81 mg and plavix 75 mg for total 3 weeks (end date 07/29/22) then transition to plavix alone. Atorvastatin increased to 80 mg. Will need follow up with stroke clinic NP in 4 weeks.   Neck pain after fall X-ray of cervical spine showed multilevel degenerative changes but no acute fractures. Neck pain is left-sided but improved over hospital course with lidocaine patch. Exam showed normal ROM.     Tachycardia Complete heart block s/p pacemaker 2016 Pacemaker model: Assurity DR model [id number] (serial number [id number]) Last interrogation in May 2023. Unable to obtain MRI due to incompatibility. Patient was tachycardic so started on metoprolol 12.5 mg twice daily. HR improved and <100.  HTN CAD (70% LAD disease and mild RCA) Saw cardiology in May. Will increase atorvastatin to 80 mg. Restarted losartan at 12.5 mg. BP in target range at day of discharge.    Hypothyroidism TSH low 0.048. Reduced levothyroxine to 100 mcg daily. Will need outpatient f/u to recheck TSH in 6 weeks.   Anxiety, Depression Patient has extensive history of anxiety, depression and suicide attempts. She is on Klonopin, Trazodone and Zoloft. Psychiatry note in May noted cogentin which she is not taking since February. Her mental status change and rigidity is likely from klonopin withdrawal. We continued her home sertraline and trazodone.    Sacral Wound Patient reports pain on her buttocks. Exam showed skin tear at intergluteal cleft. Will need to continue triple paste on affected area.  Hypokalemia Potassium dropped below 3.5 during hospital stay, IV  potassium given and improved. On day of discharge, potassium was 3.4 so potassium 40mq given. She will continue home potassium supplement.   Chest Pain Patient described intermittent dull pain at LUQ. Tylenol resolves the pain. She thinks pain coincides with constipation. Denies SOB. Less likely cardiovascular in origin. Will continue with Miralax to help with constipation.      Discharge Subjective: Patient reports pain that is located in LUQ. Tylenol helps with pain and pain associated with constipation. Denies SOB, nausea or vomiting. No other concerns and feels ready to go to SNF.    Discharge Exam:   BP 122/87 (BP Location: Left Arm)   Pulse 90   Temp 98.8 F (37.1 C) (Oral)   Resp (!) 26   Wt 68.5 kg   SpO2 98%   BMI 27.62 kg/m  Constitutional: well-appearing, in no acute distress HENT: normocephalic atraumatic Neck: supple Cardiovascular: regular rate and rhythm Pulmonary/Chest: normal work of breathing on room air Abdominal: soft, non-tender, non-distended, no guarding MSK: normal bulk and tone Neurological: alert & oriented x 3 Skin: warm and dry Psych: normal mood and behavior   Pertinent Labs, Studies, and Procedures:     Latest Ref Rng & Units 07/15/2022    3:34 AM 07/14/2022    3:30 AM 07/13/2022    7:39 AM  CBC  WBC 4.0 - 10.5 K/uL 12.0  10.5  15.1   Hemoglobin 12.0 - 15.0 g/dL 12.3  11.9  13.0   Hematocrit 36.0 - 46.0 % 37.5  37.6  40.5   Platelets 150 - 400 K/uL 313  299  333        Latest Ref Rng & Units 07/16/2022    7:45 AM 07/15/2022    3:34 AM 07/14/2022    3:30 AM  CMP  Glucose 70 - 99 mg/dL 114  97  125   BUN 8 - 23 mg/dL 24  32  29   Creatinine 0.44 - 1.00 mg/dL 0.68  0.80  0.75   Sodium 135 - 145 mmol/L 137  144  141   Potassium 3.5 - 5.1 mmol/L 3.4  3.2  3.5   Chloride 98 - 111 mmol/L 106  110  108   CO2 22 - 32 mmol/L '23  25  23   '$ Calcium 8.9 - 10.3 mg/dL 8.7  9.5  9.5     VAS [place] EXTREMITY VENOUS (DVT)  Result Date: 07/09/2022   Lower Venous DVT Study Patient Name:  BJADE BURKARD Date of Exam:   07/09/2022 Medical Rec #: [id number]      Accession #:    [id number] of Birth: 802-Oct-1942  Patient Gender: F Patient Age:   75 years Exam Location:  [facility] Procedure:      VAS [place] LOWER EXTREMITY VENOUS (DVT) Referring Phys: Cornelius Moras XU --------------------------------------------------------------------------------  Indications: Stroke.  Comparison Study: no prior Performing Technologist: Archie Patten RVS  Examination Guidelines: A complete evaluation includes B-mode imaging, spectral Doppler, color Doppler, and power Doppler as needed of all accessible portions of each vessel. Bilateral testing is considered an integral part of a complete examination. Limited examinations for reoccurring indications may be performed as noted. The reflux portion of the exam is performed with the patient in reverse Trendelenburg.  +---------+---------------+---------+-----------+----------+--------------+ RIGHT    CompressibilityPhasicitySpontaneityPropertiesThrombus Aging +---------+---------------+---------+-----------+----------+--------------+ CFV      Full           Yes      Yes                                 +---------+---------------+---------+-----------+----------+--------------+ SFJ      Full                                                        +---------+---------------+---------+-----------+----------+--------------+ FV Prox  Full                                                        +---------+---------------+---------+-----------+----------+--------------+ FV Mid   Full                                                        +---------+---------------+---------+-----------+----------+--------------+ FV DistalFull                                                        +---------+---------------+---------+-----------+----------+--------------+ PFV      Full                                                         +---------+---------------+---------+-----------+----------+--------------+ POP      Full           Yes      Yes                                 +---------+---------------+---------+-----------+----------+--------------+ PTV      Full                                                        +---------+---------------+---------+-----------+----------+--------------+ PERO  Full                                                        +---------+---------------+---------+-----------+----------+--------------+   +---------+---------------+---------+-----------+----------+--------------+ LEFT     CompressibilityPhasicitySpontaneityPropertiesThrombus Aging +---------+---------------+---------+-----------+----------+--------------+ CFV      Full           Yes      Yes                                 +---------+---------------+---------+-----------+----------+--------------+ SFJ      Full                                                        +---------+---------------+---------+-----------+----------+--------------+ FV Prox  Full                                                        +---------+---------------+---------+-----------+----------+--------------+ FV Mid   Full                                                        +---------+---------------+---------+-----------+----------+--------------+ FV DistalFull                                                        +---------+---------------+---------+-----------+----------+--------------+ PFV      Full                                                        +---------+---------------+---------+-----------+----------+--------------+ POP      Full           Yes      Yes                                 +---------+---------------+---------+-----------+----------+--------------+ PTV      Full                                                         +---------+---------------+---------+-----------+----------+--------------+ PERO     Full                                                        +---------+---------------+---------+-----------+----------+--------------+  Summary: BILATERAL: - No evidence of deep vein thrombosis seen in the lower extremities, bilaterally. -No evidence of popliteal cyst, bilaterally.   *See table(s) above for measurements and observations. Electronically signed by Servando Snare MD on 07/09/2022 at 1:35:33 PM.    Final    CT HEAD WO CONTRAST (5MM)  Result Date: 07/09/2022 CLINICAL DATA:  Provided history: Stroke, follow-up. EXAM: CT HEAD WITHOUT CONTRAST TECHNIQUE: Contiguous axial images were obtained from the base of the skull through the vertex without intravenous contrast. RADIATION DOSE REDUCTION: This exam was performed according to the departmental dose-optimization program which includes automated exposure control, adjustment of the mA and/or kV according to patient size and/or use of iterative reconstruction technique. COMPARISON:  Non-contrast head CT and CT angiogram head/neck 07/08/2022. Brain MRI 08/15/2015. FINDINGS: Brain: Moderate generalized cerebral atrophy. Mild patchy and ill-defined hypoattenuation within the cerebral white matter, nonspecific but compatible with chronic small vessel ischemic disease. There is no acute intracranial hemorrhage. No demarcated cortical infarct. No extra-axial fluid collection. No evidence of an intracranial mass. No midline shift. Vascular: No hyperdense vessel. Atherosclerotic calcifications. Skull: No fracture or aggressive osseous lesion. Sinuses/Orbits: No mass or acute finding within the imaged orbits. Opacification of a posterior right ethmoid air cell. IMPRESSION: No evidence of acute intracranial abnormality. Mild chronic small vessel ischemic changes within the cerebral white matter. Moderate generalized cerebral atrophy. Right ethmoid sinusitis. Electronically  Signed   By: Kellie Simmering D.O.   On: 07/09/2022 10:20   DG Cervical Spine Complete  Result Date: 07/08/2022 CLINICAL DATA:  93235, pain EXAM: CERVICAL SPINE - COMPLETE 4+ VIEW COMPARISON:  Same day CT neck FINDINGS: The cervical spine is visualized from C1-C6. Minimal retrolisthesis C5-6. Vertebral body heights are maintained: no evidence of acute fracture. Mild to moderate intervertebral disc space height loss at C5-6. Severe multilevel facet arthropathy and uncovertebral hypertrophy results in multilevel osseous neuroforaminal narrowing most pronounced in the mid cervical spine. No prevertebral soft tissue swelling. Atherosclerotic calcifications. IMPRESSION: Multilevel degenerative changes. Electronically Signed   By: Valentino Saxon M.D.   On: 07/08/2022 13:25   CT ANGIO HEAD NECK W WO CM W PERF (CODE STROKE)  Result Date: 07/08/2022 CLINICAL DATA:  Code stroke with deficit not provided. EXAM: CT ANGIOGRAPHY HEAD AND NECK CT PERFUSION BRAIN TECHNIQUE: Multidetector CT imaging of the head and neck was performed using the standard protocol during bolus administration of intravenous contrast. Multiplanar CT image reconstructions and MIPs were obtained to evaluate the vascular anatomy. Carotid stenosis measurements (when applicable) are obtained utilizing NASCET criteria, using the distal internal carotid diameter as the denominator. Multiphase CT imaging of the brain was performed following IV bolus contrast injection. Subsequent parametric perfusion maps were calculated using RAPID software. RADIATION DOSE REDUCTION: This exam was performed according to the departmental dose-optimization program which includes automated exposure control, adjustment of the mA and/or kV according to patient size and/or use of iterative reconstruction technique. CONTRAST:  82m OMNIPAQUE IOHEXOL 350 MG/ML SOLN COMPARISON:  Head CT from earlier today FINDINGS: CTA NECK FINDINGS Aortic arch: Limited coverage shows atheromatous  plaque. Great vessel origins are not seen. Right carotid system: Atheromatous plaque at the bifurcation and proximal ICA with proximal ICA stenosis at least measuring 65%. No ulceration or beading. Left carotid system: Fairly bulky calcified plaque at the bifurcation. Proximal ICA stenosis measures 40%. No ulceration or beading Vertebral arteries: Atherosclerosis of the subclavian arteries without visible flow reducing stenosis. The vertebral arteries are widely patent to the dura. Skeleton: Bulky degenerative  endplate and facet spurring. Other neck: Left scalp hematoma without acute fracture. Upper chest: Negative Review of the MIP images confirms the above findings CTA HEAD FINDINGS Anterior circulation: Suboptimal small-vessel detail due to bolus quality. Confluent atheromatous plaque on the carotid siphons. Right ICA narrowing measures up to 50% the paraclinoid segment, measurement limited by calcified plaque blooming. Early branching right MCA with moderate stenosis of the lower branch. Branch vessel atherosclerosis and vessel irregularity affecting both MCAs. Posterior circulation: Proximal basilar fenestration. The vertebral and basilar arteries are widely patent. No evidence of cerebellar branch occlusion. Atheromatous irregularity of the posterior cerebral arteries with especially advanced right P3 segment stenosis. Moderate to advanced left P1 2 junction stenosis. Venous sinuses: Diffusely patent Anatomic variants: As above Review of the MIP images confirms the above findings CT Brain Perfusion Findings: CBF (<30%) Volume: 81m Perfusion (Tmax>6.0s) volume: 023mIMPRESSION: 1. No emergent finding by CTA and CTP. 2. Atherosclerosis in the neck causes proximal ICA stenosis of 65% on the right and 40% on the left. 3. Extensive intracranial atherosclerosis most notably causing bilateral high-grade PCA stenosis and moderate right M1 stenosis. 4. Great vessel origins are not covered. Electronically Signed   By:  JoJorje Guild.D.   On: 07/08/2022 10:02   CT HEAD CODE STROKE WO CONTRAST  Result Date: 07/08/2022 CLINICAL DATA:  Code stroke. EXAM: CT HEAD WITHOUT CONTRAST TECHNIQUE: Contiguous axial images were obtained from the base of the skull through the vertex without intravenous contrast. RADIATION DOSE REDUCTION: This exam was performed according to the departmental dose-optimization program which includes automated exposure control, adjustment of the mA and/or kV according to patient size and/or use of iterative reconstruction technique. COMPARISON:  06/11/2022 FINDINGS: Brain: No evidence of acute infarction, hemorrhage, hydrocephalus, extra-axial collection or mass lesion/mass effect. Age normal appearance of the brain Vascular: No hyperdense vessel or unexpected calcification. Skull: High left scalp swelling. Sinuses/Orbits: No acute finding. Other: These results were communicated to Dr. StQuinn Axet 9:26 am on 07/08/2022 by text page via the [facility] Cent[facility]aging system. ASPECTS ([facility] Early CT Score) Not scored without localizing symptoms IMPRESSION: 1. Negative for hemorrhage or visible infarct. 2. Left scalp swelling. Electronically Signed   By: JoJorje Guild.D.   On: 07/08/2022 09:29     Discharge Instructions: Discharge Instructions     Ambulatory referral to Neurology   Complete by: As directed    Follow up with stroke clinic NP (Jessica Vanschaick or CaCecille Rubinif both not available, consider SeZachery Daueror [facility]. Thanks.   Call MD for:  difficulty breathing, headache or visual disturbances   Complete by: As directed    Call MD for:  extreme fatigue   Complete by: As directed    Call MD for:  hives   Complete by: As directed    Call MD for:  persistant dizziness or light-headedness   Complete by: As directed    Call MD for:  persistant nausea and vomiting   Complete by: As directed    Call MD for:  severe uncontrolled pain   Complete by: As  directed    Call MD for:  temperature >100.4   Complete by: As directed    Diet - low sodium heart healthy   Complete by: As directed    Increase activity slowly   Complete by: As directed        Signed: ZhAngelique BlonderDO 07/16/2022, 2:05 PM   Pager: [phone]

## 2022-07-16 NOTE — TOC Transition Note (Signed)
Transition of Care [facility]) - CM/SW Discharge Note   Patient Details  Name: Catherine [name] MRN: [id number] Date of Birth: 09/27/1941  Transition of Care [facility]) CM/SW Contact:  Benard Halsted, LCSW Phone Number: 07/16/2022, 2:57 PM   Clinical Narrative:    Patient will DC to: [facility] [place] Anticipated DC date: 07/16/22 Family notified: Daughter, Matilda/spouse Transport by: [name]   Per MD patient ready for DC to [facility]. RN to call report prior to discharge [phone]). RN, patient, patient's family, and facility notified of DC. Discharge Summary and FL2 sent to facility. DC packet on chart. Ambulance transport requested for patient.   CSW will sign off for now as social work intervention is no longer needed. Please consult [place] again if new needs arise.     Final next level of care: Skilled Nursing Facility Barriers to Discharge: Barriers Resolved   Patient Goals and CMS Choice Patient states their goals for this hospitalization and ongoing recovery are:: Rehab CMS Medicare.gov Compare Post Acute Care list provided to:: Patient Represenative (must comment) Choice offered to / list presented to : Adult Children  Discharge Placement   Existing PASRR number confirmed : 07/16/22          Patient chooses bed at: [facility] [place] Patient to be transferred to facility by: [place] Name of family member notified: Daughter Patient and family notified of of transfer: 07/16/22  Discharge Plan and Services                                     Social Determinants of Health (SDOH) Interventions     Readmission Risk Interventions     No data to display

## 2022-07-16 NOTE — TOC Progression Note (Addendum)
Transition of Care [facility]) - Progression Note    Patient Details  Name: Catherine [name] MRN: [id number] Date of Birth: 1941/06/26  Transition of Care [facility]) CM/SW [place] Phone Number: 07/16/2022, 9:19 AM  Clinical Narrative:    [facility] Insurance approval details: Ref# [id number], Auth ID# [id number], effective 07/13/2022-07/17/2022.   Expected Discharge Plan: Skilled Nursing Facility Barriers to Discharge: Continued Medical Work up  Expected Discharge Plan and Services Expected Discharge Plan: [place]                                               Social Determinants of Health (SDOH) Interventions    Readmission Risk Interventions     No data to display

## 2022-07-16 NOTE — Plan of Care (Signed)
  Problem: Clinical Measurements: Goal: Ability to maintain clinical measurements within normal limits will improve Outcome: Progressing Goal: Will remain free from infection Outcome: Progressing Goal: Diagnostic test results will improve Outcome: Progressing Goal: Respiratory complications will improve Outcome: Progressing Goal: Cardiovascular complication will be avoided Outcome: Progressing   Problem: Elimination: Goal: Will not experience complications related to bowel motility Outcome: Progressing Goal: Will not experience complications related to urinary retention Outcome: Progressing   Problem: Safety: Goal: Ability to remain free from injury will improve Outcome: Progressing   

## 2022-07-17 DIAGNOSIS — M6281 Muscle weakness (generalized): Secondary | ICD-10-CM | POA: Diagnosis not present

## 2022-07-17 DIAGNOSIS — G8929 Other chronic pain: Secondary | ICD-10-CM | POA: Diagnosis not present

## 2022-07-17 DIAGNOSIS — I2510 Atherosclerotic heart disease of native coronary artery without angina pectoris: Secondary | ICD-10-CM | POA: Diagnosis not present

## 2022-07-18 DIAGNOSIS — I10 Essential (primary) hypertension: Secondary | ICD-10-CM | POA: Diagnosis not present

## 2022-07-18 DIAGNOSIS — R1010 Upper abdominal pain, unspecified: Secondary | ICD-10-CM | POA: Diagnosis not present

## 2022-07-20 DIAGNOSIS — R1010 Upper abdominal pain, unspecified: Secondary | ICD-10-CM | POA: Diagnosis not present

## 2022-07-23 ENCOUNTER — Other Ambulatory Visit: Payer: Self-pay

## 2022-07-23 ENCOUNTER — Emergency Department: Payer: Medicare Other

## 2022-07-23 ENCOUNTER — Inpatient Hospital Stay
Admission: EM | Admit: 2022-07-23 | Discharge: 2022-07-28 | DRG: 378 | Disposition: A | Discharge status: Skilled Nursing Facility | Payer: Medicare Other | Attending: Internal Medicine | Admitting: Internal Medicine

## 2022-07-23 DIAGNOSIS — I5032 Chronic diastolic (congestive) heart failure: Secondary | ICD-10-CM | POA: Diagnosis not present

## 2022-07-23 DIAGNOSIS — R488 Other symbolic dysfunctions: Secondary | ICD-10-CM | POA: Diagnosis not present

## 2022-07-23 DIAGNOSIS — E119 Type 2 diabetes mellitus without complications: Secondary | ICD-10-CM

## 2022-07-23 DIAGNOSIS — R338 Other retention of urine: Secondary | ICD-10-CM | POA: Diagnosis not present

## 2022-07-23 DIAGNOSIS — R531 Weakness: Secondary | ICD-10-CM | POA: Diagnosis not present

## 2022-07-23 DIAGNOSIS — Z90721 Acquired absence of ovaries, unilateral: Secondary | ICD-10-CM

## 2022-07-23 DIAGNOSIS — Z8249 Family history of ischemic heart disease and other diseases of the circulatory system: Secondary | ICD-10-CM

## 2022-07-23 DIAGNOSIS — Z87892 Personal history of anaphylaxis: Secondary | ICD-10-CM

## 2022-07-23 DIAGNOSIS — G459 Transient cerebral ischemic attack, unspecified: Secondary | ICD-10-CM | POA: Diagnosis not present

## 2022-07-23 DIAGNOSIS — K635 Polyp of colon: Secondary | ICD-10-CM | POA: Diagnosis not present

## 2022-07-23 DIAGNOSIS — F419 Anxiety disorder, unspecified: Secondary | ICD-10-CM | POA: Diagnosis present

## 2022-07-23 DIAGNOSIS — Z743 Need for continuous supervision: Secondary | ICD-10-CM | POA: Diagnosis not present

## 2022-07-23 DIAGNOSIS — K581 Irritable bowel syndrome with constipation: Secondary | ICD-10-CM | POA: Diagnosis not present

## 2022-07-23 DIAGNOSIS — E039 Hypothyroidism, unspecified: Secondary | ICD-10-CM | POA: Diagnosis present

## 2022-07-23 DIAGNOSIS — L8992 Pressure ulcer of unspecified site, stage 2: Secondary | ICD-10-CM | POA: Diagnosis not present

## 2022-07-23 DIAGNOSIS — Z6825 Body mass index (BMI) 25.0-25.9, adult: Secondary | ICD-10-CM | POA: Diagnosis not present

## 2022-07-23 DIAGNOSIS — R300 Dysuria: Secondary | ICD-10-CM | POA: Diagnosis present

## 2022-07-23 DIAGNOSIS — E038 Other specified hypothyroidism: Secondary | ICD-10-CM | POA: Diagnosis not present

## 2022-07-23 DIAGNOSIS — D62 Acute posthemorrhagic anemia: Secondary | ICD-10-CM | POA: Diagnosis present

## 2022-07-23 DIAGNOSIS — E7800 Pure hypercholesterolemia, unspecified: Secondary | ICD-10-CM | POA: Diagnosis not present

## 2022-07-23 DIAGNOSIS — K5730 Diverticulosis of large intestine without perforation or abscess without bleeding: Secondary | ICD-10-CM | POA: Diagnosis not present

## 2022-07-23 DIAGNOSIS — R1312 Dysphagia, oropharyngeal phase: Secondary | ICD-10-CM | POA: Diagnosis not present

## 2022-07-23 DIAGNOSIS — Z91040 Latex allergy status: Secondary | ICD-10-CM

## 2022-07-23 DIAGNOSIS — Z953 Presence of xenogenic heart valve: Secondary | ICD-10-CM | POA: Diagnosis not present

## 2022-07-23 DIAGNOSIS — K922 Gastrointestinal hemorrhage, unspecified: Secondary | ICD-10-CM | POA: Diagnosis not present

## 2022-07-23 DIAGNOSIS — R109 Unspecified abdominal pain: Secondary | ICD-10-CM | POA: Diagnosis not present

## 2022-07-23 DIAGNOSIS — Z881 Allergy status to other antibiotic agents status: Secondary | ICD-10-CM

## 2022-07-23 DIAGNOSIS — R498 Other voice and resonance disorders: Secondary | ICD-10-CM | POA: Diagnosis not present

## 2022-07-23 DIAGNOSIS — E785 Hyperlipidemia, unspecified: Secondary | ICD-10-CM | POA: Diagnosis not present

## 2022-07-23 DIAGNOSIS — F32A Depression, unspecified: Secondary | ICD-10-CM | POA: Diagnosis present

## 2022-07-23 DIAGNOSIS — R339 Retention of urine, unspecified: Secondary | ICD-10-CM | POA: Diagnosis not present

## 2022-07-23 DIAGNOSIS — I509 Heart failure, unspecified: Secondary | ICD-10-CM | POA: Diagnosis not present

## 2022-07-23 DIAGNOSIS — E440 Moderate protein-calorie malnutrition: Secondary | ICD-10-CM | POA: Diagnosis not present

## 2022-07-23 DIAGNOSIS — E1169 Type 2 diabetes mellitus with other specified complication: Secondary | ICD-10-CM

## 2022-07-23 DIAGNOSIS — I110 Hypertensive heart disease with heart failure: Secondary | ICD-10-CM | POA: Diagnosis present

## 2022-07-23 DIAGNOSIS — I442 Atrioventricular block, complete: Secondary | ICD-10-CM | POA: Diagnosis not present

## 2022-07-23 DIAGNOSIS — Z79890 Hormone replacement therapy (postmenopausal): Secondary | ICD-10-CM

## 2022-07-23 DIAGNOSIS — R6889 Other general symptoms and signs: Secondary | ICD-10-CM | POA: Diagnosis not present

## 2022-07-23 DIAGNOSIS — Z79899 Other long term (current) drug therapy: Secondary | ICD-10-CM

## 2022-07-23 DIAGNOSIS — M797 Fibromyalgia: Secondary | ICD-10-CM | POA: Diagnosis not present

## 2022-07-23 DIAGNOSIS — Z950 Presence of cardiac pacemaker: Secondary | ICD-10-CM | POA: Diagnosis not present

## 2022-07-23 DIAGNOSIS — Z90710 Acquired absence of both cervix and uterus: Secondary | ICD-10-CM

## 2022-07-23 DIAGNOSIS — R1032 Left lower quadrant pain: Secondary | ICD-10-CM | POA: Diagnosis not present

## 2022-07-23 DIAGNOSIS — M6259 Muscle wasting and atrophy, not elsewhere classified, multiple sites: Secondary | ICD-10-CM | POA: Diagnosis not present

## 2022-07-23 DIAGNOSIS — Z818 Family history of other mental and behavioral disorders: Secondary | ICD-10-CM

## 2022-07-23 DIAGNOSIS — K5521 Angiodysplasia of colon with hemorrhage: Principal | ICD-10-CM | POA: Diagnosis present

## 2022-07-23 DIAGNOSIS — G9341 Metabolic encephalopathy: Secondary | ICD-10-CM | POA: Diagnosis not present

## 2022-07-23 DIAGNOSIS — M069 Rheumatoid arthritis, unspecified: Secondary | ICD-10-CM | POA: Diagnosis not present

## 2022-07-23 DIAGNOSIS — W19XXXD Unspecified fall, subsequent encounter: Secondary | ICD-10-CM | POA: Diagnosis not present

## 2022-07-23 DIAGNOSIS — E876 Hypokalemia: Secondary | ICD-10-CM | POA: Diagnosis present

## 2022-07-23 DIAGNOSIS — Z20822 Contact with and (suspected) exposure to covid-19: Secondary | ICD-10-CM | POA: Diagnosis not present

## 2022-07-23 DIAGNOSIS — I2510 Atherosclerotic heart disease of native coronary artery without angina pectoris: Secondary | ICD-10-CM | POA: Diagnosis not present

## 2022-07-23 DIAGNOSIS — I499 Cardiac arrhythmia, unspecified: Secondary | ICD-10-CM | POA: Diagnosis not present

## 2022-07-23 DIAGNOSIS — Z7982 Long term (current) use of aspirin: Secondary | ICD-10-CM

## 2022-07-23 DIAGNOSIS — Z7902 Long term (current) use of antithrombotics/antiplatelets: Secondary | ICD-10-CM

## 2022-07-23 DIAGNOSIS — I700 Atherosclerosis of aorta: Secondary | ICD-10-CM | POA: Diagnosis not present

## 2022-07-23 DIAGNOSIS — L8990 Pressure ulcer of unspecified site, unspecified stage: Secondary | ICD-10-CM | POA: Insufficient documentation

## 2022-07-23 DIAGNOSIS — Z8673 Personal history of transient ischemic attack (TIA), and cerebral infarction without residual deficits: Secondary | ICD-10-CM

## 2022-07-23 DIAGNOSIS — Z9012 Acquired absence of left breast and nipple: Secondary | ICD-10-CM

## 2022-07-23 DIAGNOSIS — K921 Melena: Secondary | ICD-10-CM | POA: Diagnosis not present

## 2022-07-23 DIAGNOSIS — Z741 Need for assistance with personal care: Secondary | ICD-10-CM | POA: Diagnosis not present

## 2022-07-23 DIAGNOSIS — N390 Urinary tract infection, site not specified: Secondary | ICD-10-CM | POA: Diagnosis not present

## 2022-07-23 DIAGNOSIS — E569 Vitamin deficiency, unspecified: Secondary | ICD-10-CM | POA: Diagnosis not present

## 2022-07-23 DIAGNOSIS — Z882 Allergy status to sulfonamides status: Secondary | ICD-10-CM

## 2022-07-23 DIAGNOSIS — I10 Essential (primary) hypertension: Secondary | ICD-10-CM | POA: Diagnosis not present

## 2022-07-23 LAB — COMPREHENSIVE METABOLIC PANEL
ALT: 15 U/L (ref 0–44)
AST: 26 U/L (ref 15–41)
Albumin: 3.6 g/dL (ref 3.5–5.0)
Alkaline Phosphatase: 62 U/L (ref 38–126)
Anion gap: 7 (ref 5–15)
BUN: 15 mg/dL (ref 8–23)
CO2: 24 mmol/L (ref 22–32)
Calcium: 9.2 mg/dL (ref 8.9–10.3)
Chloride: 105 mmol/L (ref 98–111)
Creatinine, Ser: 0.79 mg/dL (ref 0.44–1.00)
GFR, Estimated: >60 mL/min (ref >60)
Glucose, Bld: 98 mg/dL (ref 70–99)
Potassium: 4.8 mmol/L (ref 3.5–5.1)
Sodium: 136 mmol/L (ref 135–145)
Total Bilirubin: 1 mg/dL (ref 0.3–1.2)
Total Protein: 6.6 g/dL (ref 6.5–8.1)

## 2022-07-23 LAB — CBC WITH DIFFERENTIAL/PLATELET
Abs Immature Granulocytes: 0.08 10*3/uL — ABNORMAL HIGH (ref 0.00–0.07)
Basophils Absolute: 0.1 10*3/uL (ref 0.0–0.1)
Basophils Relative: 1 %
Eosinophils Absolute: 0.4 10*3/uL (ref 0.0–0.5)
Eosinophils Relative: 3 %
HCT: 36 % (ref 36.0–46.0)
Hemoglobin: 11.1 g/dL — ABNORMAL LOW (ref 12.0–15.0)
Immature Granulocytes: 1 %
Lymphocytes Relative: 11 %
Lymphs Abs: 1.5 10*3/uL (ref 0.7–4.0)
MCH: 28.3 pg (ref 26.0–34.0)
MCHC: 30.8 g/dL (ref 30.0–36.0)
MCV: 91.8 fL (ref 80.0–100.0)
Monocytes Absolute: 1.3 10*3/uL — ABNORMAL HIGH (ref 0.1–1.0)
Monocytes Relative: 9 %
Neutro Abs: 10.4 10*3/uL — ABNORMAL HIGH (ref 1.7–7.7)
Neutrophils Relative %: 75 %
Platelets: 346 10*3/uL (ref 150–400)
RBC: 3.92 MIL/uL (ref 3.87–5.11)
RDW: 14.1 % (ref 11.5–15.5)
WBC: 13.7 10*3/uL — ABNORMAL HIGH (ref 4.0–10.5)
nRBC: 0 % (ref 0.0–0.2)

## 2022-07-23 LAB — URINALYSIS, ROUTINE W REFLEX MICROSCOPIC
Bilirubin Urine: NEGATIVE
Glucose, UA: NEGATIVE mg/dL
Hgb urine dipstick: NEGATIVE
Ketones, ur: NEGATIVE mg/dL
Leukocytes,Ua: NEGATIVE
Nitrite: NEGATIVE
Protein, ur: NEGATIVE mg/dL
Specific Gravity, Urine: 1.009 (ref 1.005–1.030)
pH: 5 (ref 5.0–8.0)

## 2022-07-23 LAB — HEMOGLOBIN AND HEMATOCRIT, BLOOD
HCT: 33.7 % — ABNORMAL LOW (ref 36.0–46.0)
HCT: 32 % — ABNORMAL LOW (ref 36.0–46.0)
HCT: 31.2 % — ABNORMAL LOW (ref 36.0–46.0)
Hemoglobin: 10.5 g/dL — ABNORMAL LOW (ref 12.0–15.0)
Hemoglobin: 10 g/dL — ABNORMAL LOW (ref 12.0–15.0)
Hemoglobin: 9.7 g/dL — ABNORMAL LOW (ref 12.0–15.0)

## 2022-07-23 LAB — MRSA NEXT GEN BY PCR, NASAL: MRSA by PCR Next Gen: DETECTED — AB

## 2022-07-23 LAB — LIPASE, BLOOD: Lipase: 39 U/L (ref 11–51)

## 2022-07-23 MED ORDER — POLYETHYLENE GLYCOL 3350 17 G PO PACK
17.0000 g | PACK | Freq: Every day | ORAL | Status: DC
Start: 2022-07-24 — End: 2022-07-29
  Administered 2022-07-27 – 2022-07-28 (×2): 17 g via ORAL
  Filled 2022-07-23 (×4): qty 1

## 2022-07-23 MED ORDER — POLYETHYLENE GLYCOL 3350 17 GM/SCOOP PO POWD
17.0000 g | Freq: Every day | ORAL | Status: DC
  Filled 2022-07-23: qty 255

## 2022-07-23 MED ORDER — METOCLOPRAMIDE HCL 5 MG/ML IJ SOLN
5.0000 mg | Freq: Three times a day (TID) | INTRAMUSCULAR | Status: DC | PRN
  Administered 2022-07-24: 5 mg via INTRAVENOUS
  Filled 2022-07-23: qty 2

## 2022-07-23 MED ORDER — ONDANSETRON HCL 4 MG/2ML IJ SOLN: 4.0000 mg | Freq: Once | INTRAMUSCULAR | Status: AC

## 2022-07-23 MED ORDER — TRAZODONE HCL 50 MG PO TABS: 25.0000 mg | ORAL_TABLET | Freq: Every evening | ORAL | Status: DC | PRN

## 2022-07-23 MED ORDER — MUPIROCIN 2 % EX OINT
1.0000 | TOPICAL_OINTMENT | Freq: Two times a day (BID) | CUTANEOUS | Status: DC
  Administered 2022-07-25 – 2022-07-28 (×8): 1 via NASAL
  Filled 2022-07-23: qty 22

## 2022-07-23 MED ORDER — TRAZODONE HCL 50 MG PO TABS
50.0000 mg | ORAL_TABLET | Freq: Every day | ORAL | Status: DC
  Administered 2022-07-23 – 2022-07-28 (×6): 50 mg via ORAL
  Filled 2022-07-23 (×6): qty 1

## 2022-07-23 MED ORDER — ACETAMINOPHEN 325 MG PO TABS
650.0000 mg | ORAL_TABLET | Freq: Four times a day (QID) | ORAL | Status: DC | PRN
  Administered 2022-07-24 – 2022-07-28 (×10): 650 mg via ORAL
  Filled 2022-07-23 (×10): qty 2

## 2022-07-23 MED ORDER — LEVOTHYROXINE SODIUM 100 MCG PO TABS
100.0000 ug | ORAL_TABLET | Freq: Every day | ORAL | Status: DC
  Administered 2022-07-24 – 2022-07-28 (×4): 100 ug via ORAL
  Filled 2022-07-23 (×5): qty 1

## 2022-07-23 MED ORDER — SERTRALINE HCL 50 MG PO TABS
125.0000 mg | ORAL_TABLET | Freq: Every day | ORAL | Status: DC
  Administered 2022-07-25 – 2022-07-28 (×4): 125 mg via ORAL
  Filled 2022-07-23 (×4): qty 3
  Filled 2022-07-23: qty 2.5

## 2022-07-23 MED ORDER — LOSARTAN POTASSIUM 25 MG PO TABS
12.5000 mg | ORAL_TABLET | Freq: Every day | ORAL | Status: DC
  Administered 2022-07-25 – 2022-07-28 (×4): 12.5 mg via ORAL
  Filled 2022-07-23 (×5): qty 1

## 2022-07-23 MED ORDER — NITROGLYCERIN 0.4 MG SL SUBL
0.4000 mg | SUBLINGUAL_TABLET | SUBLINGUAL | Status: DC | PRN
Start: 2022-07-23 — End: 2022-07-29

## 2022-07-23 MED ORDER — POTASSIUM CHLORIDE CRYS ER 10 MEQ PO TBCR
10.0000 meq | EXTENDED_RELEASE_TABLET | ORAL | Status: DC
  Filled 2022-07-23: qty 1

## 2022-07-23 MED ORDER — ADULT MULTIVITAMIN W/MINERALS CH
1.0000 | ORAL_TABLET | Freq: Every day | ORAL | Status: DC
  Administered 2022-07-25 – 2022-07-28 (×4): 1 via ORAL
  Filled 2022-07-23 (×5): qty 1

## 2022-07-23 MED ORDER — ATORVASTATIN CALCIUM 80 MG PO TABS
80.0000 mg | ORAL_TABLET | Freq: Every evening | ORAL | Status: DC
  Administered 2022-07-23 – 2022-07-28 (×6): 80 mg via ORAL
  Filled 2022-07-23 (×6): qty 1

## 2022-07-23 MED ORDER — ACETAMINOPHEN 325 MG PO TABS
650.0000 mg | ORAL_TABLET | Freq: Once | ORAL | Status: AC
  Administered 2022-07-23: 650 mg via ORAL
  Filled 2022-07-23: qty 2

## 2022-07-23 MED ORDER — ISOSORBIDE MONONITRATE ER 30 MG PO TB24
15.0000 mg | ORAL_TABLET | Freq: Every day | ORAL | Status: DC
  Administered 2022-07-25 – 2022-07-28 (×4): 15 mg via ORAL
  Filled 2022-07-23 (×5): qty 1

## 2022-07-23 MED ORDER — ACETAMINOPHEN 650 MG RE SUPP: 650.0000 mg | Freq: Four times a day (QID) | RECTAL | Status: DC | PRN

## 2022-07-23 MED ORDER — SODIUM CHLORIDE 0.9 % IV SOLN: INTRAVENOUS | Status: DC

## 2022-07-23 MED ORDER — MORPHINE SULFATE (PF) 2 MG/ML IV SOLN
2.0000 mg | Freq: Once | INTRAVENOUS | Status: AC
  Administered 2022-07-23: 2 mg via INTRAVENOUS
  Filled 2022-07-23: qty 1

## 2022-07-23 MED ORDER — CLONAZEPAM 0.25 MG PO TBDP
0.5000 mg | ORAL_TABLET | Freq: Every day | ORAL | Status: DC
  Administered 2022-07-23 – 2022-07-28 (×6): 0.5 mg via ORAL
  Filled 2022-07-23 (×6): qty 2

## 2022-07-23 MED ORDER — ONDANSETRON HCL 4 MG/2ML IJ SOLN
INTRAMUSCULAR | Status: AC
  Administered 2022-07-23: 4 mg via INTRAVENOUS
  Filled 2022-07-23: qty 2

## 2022-07-23 MED ORDER — IOHEXOL 300 MG/ML  SOLN
100.0000 mL | Freq: Once | INTRAMUSCULAR | Status: AC | PRN
  Administered 2022-07-23: 100 mL via INTRAVENOUS

## 2022-07-23 MED ORDER — METOPROLOL TARTRATE 25 MG PO TABS
12.5000 mg | ORAL_TABLET | Freq: Two times a day (BID) | ORAL | Status: DC
  Administered 2022-07-23 – 2022-07-28 (×9): 12.5 mg via ORAL
  Filled 2022-07-23 (×11): qty 1

## 2022-07-23 MED ORDER — CHLORHEXIDINE GLUCONATE CLOTH 2 % EX PADS
6.0000 | MEDICATED_PAD | Freq: Every day | CUTANEOUS | Status: AC
  Administered 2022-07-24 – 2022-07-28 (×5): 6 via TOPICAL

## 2022-07-23 NOTE — ED Provider Notes (Addendum)
[name]    Event Date/Time   First MD Initiated Contact with Patient 07/23/22 1039     (approximate)   History   Rectal Bleeding   HPI  Catherine [name] is a 81 y.o. female   Past medical history of CAD, TAVR, pacemaker, diabetes, thyroid on Synthroid, fibromyalgia, who is here with blood in the stools.  First occurrence this morning while straining on the toilet.  Maroon stools.  She has had chronic left-sided abdominal pain that is unchanged for the last month, also noted during her inpatient stay earlier this month.  She comes from a nursing facility.  Denies nausea or vomiting.  No fever.  Some stinging with urination. Is on dual antiplatelet therapy possible TIA in the setting of acute encephalopathy which was thought more likely to be metabolic in nature during her hospitalization earlier this month.  She was discharged last week on 07/16/2022.  Denies chest pain, shortness of breath, trauma.  History was obtained via patient, EMS, and review of external notes.      Physical Exam   Triage Vital Signs: ED Triage Vitals [07/23/22 1035]  Enc Vitals Group     BP      Pulse      Resp      Temp      Temp src      SpO2      Weight 139 lb 3.2 oz (63.1 kg)     Height      Head Circumference      Peak Flow      Pain Score      Pain Loc      Pain Edu?      Excl. in [place]?     Most recent vital signs: Vitals:   07/23/22 1043  BP: 130/70  Pulse: 85  Resp: (!) 29  Temp: 98.7 F (37.1 C)  SpO2: 100%    General: Awake, no distress. Alert, cooperative, and pleasant.  CV:  Good peripheral perfusion.  Resp:  Normal effort.  Abd:  No distention. Tender in LLQ. No rigidity or gaurding  Other:  Maroon stool, no external hemorrhoids or lesions. Guiac Positive.    ED Results / Procedures / Treatments   Labs (all labs ordered are listed, but only abnormal results are displayed) Labs Reviewed  CBC WITH DIFFERENTIAL/PLATELET -  Abnormal; Notable for the following components:      Result Value   WBC 13.7 (*)    Hemoglobin 11.1 (*)    Neutro Abs 10.4 (*)    Monocytes Absolute 1.3 (*)    Abs Immature Granulocytes 0.08 (*)    All other components within normal limits  URINALYSIS, ROUTINE W REFLEX MICROSCOPIC - Abnormal; Notable for the following components:   Color, Urine YELLOW (*)    APPearance HAZY (*)    All other components within normal limits  COMPREHENSIVE METABOLIC PANEL  LIPASE, BLOOD     I reviewed labs and they are notable for hemoglobin w/o severe anemia  RADIOLOGY I dependently reviewed and interpreted CT of the abdomen see no obvious infectious or obstructive process.  Please defer to radiology for final read   PROCEDURES:  Critical Care performed: No  Procedures   MEDICATIONS ORDERED IN ED: Medications  atorvastatin (LIPITOR) tablet 80 mg (has no administration in time range)  isosorbide mononitrate (IMDUR) 24 hr tablet 15 mg (has no administration in time range)  losartan (COZAAR) tablet 12.5 mg (has no administration in  time range)  metoprolol tartrate (LOPRESSOR) tablet 12.5 mg (has no administration in time range)  nitroGLYCERIN (NITROSTAT) SL tablet 0.4 mg (has no administration in time range)  sertraline (ZOLOFT) tablet 125 mg (has no administration in time range)  traZODone (DESYREL) tablet 50 mg (has no administration in time range)  levothyroxine (SYNTHROID) tablet 100 mcg (has no administration in time range)  polyethylene glycol powder (GLYCOLAX/MIRALAX) container 17 g (has no administration in time range)  clonazePAM (KLONOPIN) tablet 0.5 mg (has no administration in time range)  multivitamin with minerals tablet 1 tablet (has no administration in time range)  potassium chloride (KLOR-CON) CR tablet 10 mEq (has no administration in time range)  0.9 %  sodium chloride infusion (has no administration in time range)  acetaminophen (TYLENOL) tablet 650 mg (has no  administration in time range)    Or  acetaminophen (TYLENOL) suppository 650 mg (has no administration in time range)  traZODone (DESYREL) tablet 25 mg (has no administration in time range)  metoCLOPramide (REGLAN) injection 5 mg (has no administration in time range)  morphine (PF) 2 MG/ML injection 2 mg (2 mg Intravenous Given 07/23/22 1126)  acetaminophen (TYLENOL) tablet 650 mg (650 mg Oral Given 07/23/22 1126)  ondansetron (ZOFRAN) injection 4 mg (4 mg Intravenous Given 07/23/22 1157)  iohexol (OMNIPAQUE) 300 MG/ML solution 100 mL (100 mLs Intravenous Contrast Given 07/23/22 1236)    Consultants:  I spoke with hospitalist regarding care plan for this patient and admission plan  IMPRESSION / MDM / [place] / ED COURSE  I reviewed the triage vital signs and the nursing notes.                              Differential diagnosis includes, but is not limited to, lower GI bleeding, diverticular bleed, diverticulitis, urinary tract infection.   MDM: This is an 81 year old woman with a chief complaint of maroon stools first occurrence this morning along with some left lower quadrant pain that has been chronic in nature.  She is on dual antiplatelet therapy.  Hemodynamics are appropriate at this time and not suggestive of navicular blood loss though she is high risk given dual antiplatelet therapy and obvious blood on my exam.  We will get a CT scan of her abdomen and pelvis.  Check for UTI given complaints of painful urination.    Despite cardiac risk factors, given cc rectal bleed and lower abd pain will defer cardiac w/u today.   I anticipate admission given new lower GI bleed and a higher risk patient who recently started on dual antiplatelet therapy.  Dispo: After careful consideration of this patient's presentation, medical and social risk factors, and evaluation in the emergency department I engaged in shared decision making with the patient and/or their representative to  consider admission or observation and this patient was ultimately admitted because lower GI bleed in stable condition on new dual antiplatelet therapy for observation..   Patient's presentation is most consistent with acute presentation with potential threat to life or bodily function.       FINAL CLINICAL IMPRESSION(S) / ED DIAGNOSES   Final diagnoses:  Acute lower GI bleeding     Rx / DC Orders   ED Discharge Orders     None        Note:  This document was prepared using Dragon voice recognition software and may include unintentional dictation errors.    Lucillie Garfinkel, MD 07/23/22 [phone]  Lucillie Garfinkel, MD 07/23/22 [phone]
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
## 2022-07-23 NOTE — Assessment & Plan Note (Signed)
-   We will continue Synthroid. 

## 2022-07-23 NOTE — H&P (Addendum)
[place]   PATIENT NAME: Catherine [name]    MR#:  [id number]  DATE OF BIRTH:  [birth year]  DATE OF ADMISSION:  07/23/2022  PRIMARY CARE PHYSICIAN: Danelle Berry, NP   Patient is coming from: [facility]  REQUESTING/REFERRING PHYSICIAN: Blanchard Mane, MD  CHIEF COMPLAINT:   Chief Complaint  Patient presents with   Rectal Bleeding    HISTORY OF PRESENT ILLNESS:  Catherine [name] is a 81 y.o. Caucasian female with medical history significant for coronary artery disease, type 2 diabetes mellitus, hypertension, dyslipidemia, hypothyroidism, IBS, rheumatoid arthritis, s/p TAVR and chronic diastolic CHF, and recent TIA for which she was placed on dual antiplatelet therapy with aspirin and Plavix and discharged on 07/16/2022, who presented to the emergency room with acute onset of rectal bleeding.  She was straining today when she was having a bowel movement when this happened.  She has been having maroon stools with chronic left-sided abdominal pain that is unchanged for the last month.  She admits to nausea without vomiting or heartburn.  No fever or chills.  No current chest pain or palpitations.  No dyspnea or cough or wheezing or hemoptysis.  She has mild dysuria without oliguria or hematuria or flank pain.  No other bleeding diathesis.  ED Course: When she came to the ER, respiratory it was 29 with otherwise normal vital signs.  Labs revealed leukocytosis of 13.7 with neutrophilia and mild anemia with hemoglobin 11.1 and hematocrit 36 compared to 12.3/37.5 on 8/13.  Urinalysis K[place] negative.  CMP was within normal. EKG as reviewed by me : EKG showed sinus rhythm with a rate of 86 with LVH with IVCD, LAD and secondary repolarization with inferior Q waves and T wave inversion laterally with prolonged QT interval with QTc of 515 MS. Imaging: Abdominal pelvic CT scan revealed no acute intra abdominal or pelvic abnormalities.  It showed diverticulosis without diverticulitis and aortic  atherosclerosis.  The patient was given 2 mg of IV morphine sulfate, 4 mg of IV Zofran and 650 mg p.o. Tylenol.  She will be admitted to a progressive unit bed for further evaluation and management. PAST MEDICAL HISTORY:   Past Medical History:  Diagnosis Date   Acute colitis    Anemia    CAD (coronary artery disease)    a. heavy calcification of the entire LAD & mod eccentric mid LAD stenosis, estimated @ 70%, mild nonobs stenosis of RCA and LCx, severely calcified Ao valve with restricted valve mobility and 2+ AI     Cancer (HCC) 1981   breast   CHB (complete heart block) ([place]) 08/31/2015   [employer] Assurity DR  model [id number] (serial number  [id number] )    Chronic diastolic congestive heart failure ([place])    a. echo 08/31/2015: EF 65-70%, nl WM, GR1DD, Ao valve stent bioprosthesis was present and functioning nl, no regurg, LA mildly dilated, PASP 46 mm Hg   Collagen vascular disease (HCC)    Colon polyps    Depression    Diabetes mellitus type II    Fibromyalgia    HTN (hypertension)    Hypercholesteremia    Hypothyroidism    IBS (irritable bowel syndrome)    Presence of permanent cardiac pacemaker    Rectal bleeding    Rheumatoid arthritis(714.0)    S/P TAVR (transcatheter aortic valve replacement) 08/30/2015   26 mm Edwards Sapien 3 transcatheter heart valve placed via open right transfemoral approach   Shortness of breath  dyspnea    Stenosis of aortic valve    Suicide attempt ([place])    Thyroid disease     PAST SURGICAL HISTORY:   Past Surgical History:  Procedure Laterality Date   ABDOMINAL SURGERY     Intestinal surgery   CARDIAC SURGERY     CESAREAN SECTION     x 2   EP IMPLANTABLE DEVICE N/A 08/31/2015   Procedure: Pacemaker Implant;  Surgeon: Will Meredith Leeds, MD; [place] (serial number  [id number] ); Laterality: Right   ESOPHAGOGASTRODUODENOSCOPY (EGD) WITH PROPOFOL N/A 04/04/2021   Procedure: ESOPHAGOGASTRODUODENOSCOPY (EGD)  WITH PROPOFOL;  Surgeon: Jonathon Bellows, MD;  Location: [facility] ENDOSCOPY;  Service: Gastroenterology;  Laterality: N/A;   LAPAROSCOPY N/A 01/31/2020   Procedure: LAPAROSCOPY DIAGNOSTIC;  Surgeon: Kieth Brightly Arta Bruce, MD;  Location: [place];  Service: General;  Laterality: N/A;   LAPAROTOMY N/A 01/31/2020   Procedure: Exploratory Laparotomy;  Surgeon: Kieth Brightly Arta Bruce, MD;  Location: [place];  Service: General;  Laterality: N/A;   LEFT OOPHORECTOMY     LYSIS OF ADHESION  01/31/2020   Procedure: Lysis Of Adhesions, Ligation of Falciform Vein;  Surgeon: Mickeal Skinner, MD;  Location: [place];  Service: General;;   MASTECTOMY Left    polyp      self inflicted chest wound     [place]   lower back   TEE WITHOUT CARDIOVERSION N/A 08/30/2015   Procedure: TRANSESOPHAGEAL ECHOCARDIOGRAM (TEE);  Surgeon: Sherren Mocha, MD;  Location: [place];  Service: Open Heart Surgery;  Laterality: N/A;   TOTAL ABDOMINAL HYSTERECTOMY     TRANSCATHETER AORTIC VALVE REPLACEMENT, TRANSFEMORAL N/A 08/30/2015   Procedure: TRANSCATHETER AORTIC VALVE REPLACEMENT, TRANSFEMORAL;  Surgeon: Sherren Mocha, MD;  Location: [place];  Service: Open Heart Surgery;  Laterality: N/A;    SOCIAL HISTORY:   Social History   Tobacco Use   Smoking status: Never   Smokeless tobacco: Never  Substance Use Topics   Alcohol use: No    Alcohol/week: 0.0 standard drinks of alcohol    FAMILY HISTORY:   Family History  Problem Relation Age of Onset   Depression Sister    Depression Sister    Depression Sister    Hypertension Mother    Lung cancer Sister    Stroke Sister    Hypertension Sister    Pneumonia Brother    Heart attack Neg Hx     DRUG ALLERGIES:   Allergies  Allergen Reactions   Tetracyclines & Related Anaphylaxis and Rash   Sulfa Antibiotics Hives   Latex Rash    REVIEW OF SYSTEMS:   ROS As per history of present illness. All pertinent systems were reviewed above. Constitutional, HEENT,  cardiovascular, respiratory, GI, GU, musculoskeletal, neuro, psychiatric, endocrine, integumentary and hematologic systems were reviewed and are otherwise negative/unremarkable except for positive findings mentioned above in the HPI.   MEDICATIONS AT HOME:   Prior to Admission medications   Medication Sig Start Date End Date Taking? Authorizing Provider  acetaminophen (TYLENOL) 325 MG tablet Take 2 tablets (650 mg total) by mouth every 4 (four) hours as needed. 05/22/22   Poggi, Clarnce Flock, PA-C  aspirin EC 81 MG tablet Take 1 tablet (81 mg total) by mouth daily. Swallow whole. 04/10/22   Minna Merritts, MD  atorvastatin (LIPITOR) 80 MG tablet Take 1 tablet (80 mg total) by mouth daily. 07/17/22   Angelique Blonder, DO  clonazePAM (KLONOPIN) 0.5 MG tablet Take 1 tablet (0.5  mg total) by mouth at bedtime. 05/02/22   Arfeen, Arlyce Harman, MD  clopidogrel (PLAVIX) 75 MG tablet Take 1 tablet (75 mg total) by mouth daily. 07/17/22   Angelique Blonder, DO  isosorbide mononitrate (IMDUR) 30 MG 24 hr tablet TAKE 1/2 OF A TABLET (15 MG TOTAL) BY MOUTH DAILY Patient taking differently: Take 15 mg by mouth daily. 06/14/22   Minna Merritts, MD  levothyroxine (SYNTHROID) 100 MCG tablet Take 1 tablet (100 mcg total) by mouth daily at 6 (six) AM. 07/17/22   Angelique Blonder, DO  losartan (COZAAR) 25 MG tablet Take 1/2 tablet (12.5 mg total) by mouth daily. 07/17/22   Angelique Blonder, DO  metoprolol tartrate (LOPRESSOR) 25 MG tablet Take 1/2 tablet (12.5 mg total) by mouth 2 (two) times daily. 07/16/22   Angelique Blonder, DO  Multiple Vitamin (MULTIVITAMIN WITH MINERALS) TABS tablet Take 1 tablet by mouth daily.    [provider]  nitroGLYCERIN (NITROSTAT) 0.4 MG SL tablet Place 1 tablet (0.4 mg total) under the tongue every 5 (five) minutes as needed for chest pain. 04/10/22   Minna Merritts, MD  polyethylene glycol powder (GLYCOLAX/MIRALAX) 17 GM/SCOOP powder Take 17 g by mouth daily. 07/16/22   Angelique Blonder, DO   potassium chloride (KLOR-CON) 10 MEQ tablet TAKE 1 TABLET BY MOUTH EVERY OTHER DAY Patient taking differently: Take 10 mEq by mouth every other day. 01/11/21   Dunn, Areta Haber, PA-C  sertraline (ZOLOFT) 50 MG tablet Take 2.5 tablets (125 mg total) by mouth daily. 05/02/22   Arfeen, Arlyce Harman, MD  traZODone (DESYREL) 50 MG tablet Take 50 mg by mouth at bedtime. 08/04/20   Anabel Bene, MD  Zinc Oxide (TRIPLE PASTE) 12.8 % ointment Apply topically 4 (four) times daily. 07/16/22   Angelique Blonder, DO      VITAL SIGNS:  Blood pressure 130/70, pulse 85, temperature 98.7 F (37.1 C), temperature source Oral, resp. rate (!) 29, weight 63.1 kg, SpO2 100 %.  PHYSICAL EXAMINATION:  Physical Exam  GENERAL:  81 y.o.-year-old Caucasian female patient lying in the bed with no acute distress.  EYES: Pupils equal, round, reactive to light and accommodation. No scleral icterus. Extraocular muscles intact.  HEENT: Head atraumatic, normocephalic. Oropharynx and nasopharynx clear.  NECK:  Supple, no jugular venous distention. No thyroid enlargement, no tenderness.  LUNGS: Normal breath sounds bilaterally, no wheezing, rales,rhonchi or crepitation. No use of accessory muscles of respiration.  CARDIOVASCULAR: Regular rate and rhythm, S1, S2 normal. No murmurs, rubs, or gallops.  ABDOMEN: Soft, nondistended, nontender. Bowel sounds present. No organomegaly or mass.  EXTREMITIES: No pedal edema, cyanosis, or clubbing.  NEUROLOGIC: Cranial nerves II through XII are intact. Muscle strength 5/5 in all extremities. Sensation intact. Gait not checked.  PSYCHIATRIC: The patient is alert and oriented x 3.  Normal affect and good eye contact. SKIN: Residual  forehead contusion and right forearm ecchymosis.  No new lesions or rash or ulcers.  LABORATORY PANEL:   CBC Recent Labs  Lab 07/23/22 1109  WBC 13.7*  HGB 11.1*  HCT 36.0  PLT 346    ------------------------------------------------------------------------------------------------------------------  Chemistries  Recent Labs  Lab 07/23/22 1109  NA 136  K 4.8  CL 105  CO2 24  GLUCOSE 98  BUN 15  CREATININE 0.79  CALCIUM 9.2  AST 26  ALT 15  ALKPHOS 62  BILITOT 1.0   ------------------------------------------------------------------------------------------------------------------  Cardiac Enzymes No results for input(s): "TROPONINI" in the last 168 hours. ------------------------------------------------------------------------------------------------------------------  RADIOLOGY:  CT  Abdomen Pelvis W Contrast  Result Date: 07/23/2022 CLINICAL DATA:  Left lower quadrant abdominal pain EXAM: CT ABDOMEN AND PELVIS WITH CONTRAST TECHNIQUE: Multidetector CT imaging of the abdomen and pelvis was performed using the standard protocol following bolus administration of intravenous contrast. RADIATION DOSE REDUCTION: This exam was performed according to the departmental dose-optimization program which includes automated exposure control, adjustment of the mA and/or kV according to patient size and/or use of iterative reconstruction technique. CONTRAST:  16m OMNIPAQUE IOHEXOL 300 MG/ML  SOLN COMPARISON:  CT abdomen and pelvis dated June 11, 2022 FINDINGS: Lower chest: Prior transcatheter aortic valve replacement. Partially visualized pacer wires in the right atrium and right ventricle. Bibasilar atelectasis. Hepatobiliary: No focal liver abnormality is seen. No gallstones, gallbladder wall thickening, or biliary dilatation. Pancreas: Unremarkable. No pancreatic ductal dilatation or surrounding inflammatory changes. Spleen: Normal in size without focal abnormality. Adrenals/Urinary Tract: Bilateral adrenal glands are unremarkable. No hydronephrosis or nephrolithiasis. Stable hyperdense exophytic lesion of the lower pole of the right kidney. Bladder is unremarkable.  Stomach/Bowel: Stomach is within normal limits. Diverticulosis. No evidence of bowel wall thickening, distention, or inflammatory changes. Vascular/Lymphatic: Aortic atherosclerosis. No enlarged abdominal or pelvic lymph nodes. Reproductive: No adnexal masses. Other: No abdominal wall hernia or abnormality. No abdominopelvic ascites. Musculoskeletal: No acute or significant osseous findings. IMPRESSION: 1. No acute findings in the abdomen or pelvis. 2. Diverticulosis with no evidence of diverticulitis. 3. Aortic Atherosclerosis (ICD10-I70.0). Electronically Signed   By: LYetta GlassmanM.D.   On: 07/23/2022 13:37      IMPRESSION AND PLAN:  Assessment and Plan: * GI bleeding - The patient will be  admitted to a PCU bed. - She has mild acute blood loss anemia. - We will follow serial H&H. - We will stop her aspirin and Plavix given for recent TIA. - GI consultation will be obtained. - I notified Dr. LHaig Prophetabout patient.  Type 2 diabetes mellitus without complications (HCC) - We will place the patient on supplement coverage with NovoLog.  Anxiety and depression - We will continue her colonic pain, Zoloft and trazodone.  Coronary artery disease - We will continue her statin therapy, beta-blocker and ARB therapy. - Aspirin and Plavix are being held off.  Dyslipidemia - We will continue statin therapy.  Hypothyroidism - We will continue Synthroid.   DVT prophylaxis: SCDs. Advanced Care Planning:  Code Status: full code.  Family Communication:  The plan of care was discussed in details with the patient (and family). I answered all questions. The patient agreed to proceed with the above mentioned plan. Further management will depend upon hospital course. Disposition Plan: Back to previous home environment Consults called: GI consult. All the records are reviewed and case discussed with ED provider.  Status is: Inpatient   At the time of the admission, it appears that the  appropriate admission status for this patient is inpatient.  This is judged to be reasonable and necessary in order to provide the required intensity of service to ensure the patient's safety given the presenting symptoms, physical exam findings and initial radiographic and laboratory data in the context of comorbid conditions.  The patient requires inpatient status due to high intensity of service, high risk of further deterioration and high frequency of surveillance required.  I certify that at the time of admission, it is my clinical judgment that the patient will require inpatient hospital care extending more than 2 midnights.  Dispo: The patient is from: [facility]              Anticipated d/c is to: [facility]              Patient currently is not medically stable to d/c.              Difficult to place patient: No  Christel Mormon M.D on 07/23/2022 at 2:41 PM  [facility]   From 7 PM-7 AM, contact night-coverage [email or web address]  CC: Primary care physician; Danelle Berry, NP

## 2022-07-23 NOTE — Assessment & Plan Note (Signed)
-   We will place the patient on supplement coverage with NovoLog.

## 2022-07-23 NOTE — ED Triage Notes (Signed)
Pt reports having a bm this morning and then having blood on the toilet paper. Hx of hemorrhoids. Pt reports lower abd pain.

## 2022-07-23 NOTE — Assessment & Plan Note (Addendum)
-   The patient will be  admitted to a PCU bed. - She has mild acute blood loss anemia. - We will follow serial H&H. - We will stop her aspirin and Plavix given for recent TIA. - GI consultation will be obtained. - I notified Dr. Haig Prophet about patient.

## 2022-07-23 NOTE — Assessment & Plan Note (Signed)
-   We will continue statin therapy. 

## 2022-07-23 NOTE — Assessment & Plan Note (Signed)
-   We will continue her statin therapy, beta-blocker and ARB therapy. - Aspirin and Plavix are being held off.

## 2022-07-23 NOTE — Assessment & Plan Note (Signed)
-   We will continue her colonic pain, Zoloft and trazodone.

## 2022-07-24 DIAGNOSIS — E038 Other specified hypothyroidism: Secondary | ICD-10-CM

## 2022-07-24 DIAGNOSIS — E785 Hyperlipidemia, unspecified: Secondary | ICD-10-CM | POA: Diagnosis not present

## 2022-07-24 DIAGNOSIS — I2510 Atherosclerotic heart disease of native coronary artery without angina pectoris: Secondary | ICD-10-CM | POA: Diagnosis not present

## 2022-07-24 DIAGNOSIS — K922 Gastrointestinal hemorrhage, unspecified: Secondary | ICD-10-CM | POA: Diagnosis not present

## 2022-07-24 DIAGNOSIS — F419 Anxiety disorder, unspecified: Secondary | ICD-10-CM | POA: Diagnosis not present

## 2022-07-24 LAB — CBC
HCT: 29.5 % — ABNORMAL LOW (ref 36.0–46.0)
Hemoglobin: 9.2 g/dL — ABNORMAL LOW (ref 12.0–15.0)
MCH: 28.6 pg (ref 26.0–34.0)
MCHC: 31.2 g/dL (ref 30.0–36.0)
MCV: 91.6 fL (ref 80.0–100.0)
Platelets: 242 10*3/uL (ref 150–400)
RBC: 3.22 MIL/uL — ABNORMAL LOW (ref 3.87–5.11)
RDW: 14.1 % (ref 11.5–15.5)
WBC: 7.6 10*3/uL (ref 4.0–10.5)
nRBC: 0 % (ref 0.0–0.2)

## 2022-07-24 LAB — GLUCOSE, CAPILLARY
Glucose-Capillary: 112 mg/dL — ABNORMAL HIGH (ref 70–99)
Glucose-Capillary: 135 mg/dL — ABNORMAL HIGH (ref 70–99)
Glucose-Capillary: 90 mg/dL (ref 70–99)

## 2022-07-24 LAB — BASIC METABOLIC PANEL
Anion gap: 4 — ABNORMAL LOW (ref 5–15)
BUN: 9 mg/dL (ref 8–23)
CO2: 22 mmol/L (ref 22–32)
Calcium: 8 mg/dL — ABNORMAL LOW (ref 8.9–10.3)
Chloride: 112 mmol/L — ABNORMAL HIGH (ref 98–111)
Creatinine, Ser: 0.54 mg/dL (ref 0.44–1.00)
GFR, Estimated: >60 mL/min (ref >60)
Glucose, Bld: 78 mg/dL (ref 70–99)
Potassium: 3.4 mmol/L — ABNORMAL LOW (ref 3.5–5.1)
Sodium: 138 mmol/L (ref 135–145)

## 2022-07-24 MED ORDER — INSULIN ASPART 100 UNIT/ML IJ SOLN
0.0000 [IU] | INTRAMUSCULAR | Status: DC
  Administered 2022-07-24 – 2022-07-28 (×7): 1 [IU] via SUBCUTANEOUS
  Filled 2022-07-24 (×7): qty 1

## 2022-07-24 MED ORDER — PEG 3350-KCL-NA BICARB-NACL 420 G PO SOLR
4000.0000 mL | Freq: Once | ORAL | Status: AC
  Administered 2022-07-24: 4000 mL via ORAL
  Filled 2022-07-24: qty 4000

## 2022-07-24 MED ORDER — BOOST / RESOURCE BREEZE PO LIQD CUSTOM: 1.0000 | Freq: Three times a day (TID) | ORAL | Status: DC

## 2022-07-24 NOTE — Consult Note (Signed)
   [facility] Inpatient Consult   07/24/2022  JOHNANNA BAKKE 10/28/1941 [id number]  [place] [facility] [ACO] Patient: [facility] Liaison coverage remote review for patient at [facility]  Primary Care Provider:  Danelle Berry, NP with [facility]   Patient screened for less than 7 days readmission hospitalization with noted extreme high risk score for unplanned readmission risk and  to assess for potential [facility] service needs for post hospital transition.  Review of patient's medical record reveals patient is coming from a skilled nursing facility level of care.   Plan:  Continue to follow progress and disposition to assess for post hospital care management needs.    For questions contact:   Natividad Brood, RN BSN [facility] Liaison  [phone] business mobile phone Toll free office [phone]  Fax number: [phone] [email or web address]

## 2022-07-24 NOTE — Progress Notes (Signed)
PROGRESS NOTE    Catherine [name]  CXK:[id number] DOB: March 20, 1941 DOA: 07/23/2022 PCP: Danelle Berry, NP    Brief Narrative:  Catherine [name] is a 81 y.o. Caucasian female with medical history significant for coronary artery disease, type 2 diabetes mellitus, hypertension, dyslipidemia, hypothyroidism, IBS, rheumatoid arthritis, s/p TAVR and chronic diastolic CHF, and recent TIA for which she was placed on dual antiplatelet therapy with aspirin and Plavix and discharged on 07/16/2022, who presented to the emergency room with acute onset of rectal bleeding  Abdominal pelvic CT scan revealed no acute intra abdominal or pelvic abnormalities.  It showed diverticulosis without diverticulitis and aortic atherosclerosis  Gi consulted, w/u pending  Consultants:  GI  Procedures:   Antimicrobials:      Subjective: Has no new complaints. Feels weakn. No sob or cp  Objective: Vitals:   07/23/22 2009 07/23/22 2319 07/24/22 0410 07/24/22 0745  BP: (!) 121/49 (!) 146/57 (!) 133/52 (!) 144/50  Pulse: 60 63 61 68  Resp: '20 15 17 16  '$ Temp: 98.5 F (36.9 C) 98.4 F (36.9 C) (!) 97.5 F (36.4 C) 98.1 F (36.7 C)  TempSrc: Oral Oral Axillary Oral  SpO2: 99% 100% 99% 99%  Weight:      Height:        Intake/Output Summary (Last 24 hours) at 07/24/2022 9702 Last data filed at 07/24/2022 0600 Gross per 24 hour  Intake 1596.58 ml  Output 525 ml  Net 1071.58 ml   Filed Weights   07/23/22 1035 07/23/22 1651  Weight: 63.1 kg 63 kg    Examination:  Calm, NAD Cta no w/r Reg s1/s2 no gallop Soft benign +bs No edema Awake and alert Mood and affect appropriate in current setting     Data Reviewed: I have personally reviewed following labs and imaging studies  CBC: Recent Labs  Lab 07/23/22 1109 07/23/22 1718 07/23/22 2051 07/23/22 2205 07/24/22 0519  WBC 13.7*  --   --   --  7.6  NEUTROABS 10.4*  --   --   --   --   HGB 11.1* 10.5* 10.0* 9.7* 9.2*  HCT 36.0 33.7* 32.0*  31.2* 29.5*  MCV 91.8  --   --   --  91.6  PLT 346  --   --   --  637   Basic Metabolic Panel: Recent Labs  Lab 07/23/22 1109 07/24/22 0519  NA 136 138  K 4.8 3.4*  CL 105 112*  CO2 24 22  GLUCOSE 98 78  BUN 15 9  CREATININE 0.79 0.54  CALCIUM 9.2 8.0*   GFR: Estimated Creatinine Clearance: 46.5 mL/min (by C-G formula based on SCr of 0.54 mg/dL). Liver Function Tests: Recent Labs  Lab 07/23/22 1109  AST 26  ALT 15  ALKPHOS 62  BILITOT 1.0  PROT 6.6  ALBUMIN 3.6   Recent Labs  Lab 07/23/22 1109  LIPASE 39   No results for input(s): "AMMONIA" in the last 168 hours. Coagulation Profile: No results for input(s): "INR", "PROTIME" in the last 168 hours. Cardiac Enzymes: No results for input(s): "CKTOTAL", "CKMB", "CKMBINDEX", "TROPONINI" in the last 168 hours. BNP (last 3 results) No results for input(s): "PROBNP" in the last 8760 hours. HbA1C: No results for input(s): "HGBA1C" in the last 72 hours. CBG: No results for input(s): "GLUCAP" in the last 168 hours. Lipid Profile: No results for input(s): "CHOL", "HDL", "LDLCALC", "TRIG", "CHOLHDL", "LDLDIRECT" in the last 72 hours. Thyroid Function Tests: No results for input(s): "TSH", "T4TOTAL", "  FREET4", "T3FREE", "THYROIDAB" in the last 72 hours. Anemia Panel: No results for input(s): "VITAMINB12", "FOLATE", "FERRITIN", "TIBC", "IRON", "RETICCTPCT" in the last 72 hours. Sepsis Labs: No results for input(s): "PROCALCITON", "LATICACIDVEN" in the last 168 hours.  Recent Results (from the past 240 hour(s))  MRSA Next Gen by PCR, Nasal     Status: Abnormal   Collection Time: 07/23/22  6:56 PM   Specimen: Nasal Mucosa; Nasal Swab  Result Value Ref Range Status   MRSA by PCR Next Gen DETECTED (A) NOT DETECTED Final    Comment: RESULT CALLED TO, READ BACK BY AND VERIFIED WITH: MARISSA GRAHAM 07/23/22 2048 MU (NOTE) The GeneXpert MRSA Assay (FDA approved for NASAL specimens only), is one component of a comprehensive  MRSA colonization surveillance program. It is not intended to diagnose MRSA infection nor to guide or monitor treatment for MRSA infections. Test performance is not FDA approved in patients less than 60 years old. Performed at [facility], [address] [place], [place] [address]          Radiology Studies: CT Abdomen Pelvis W Contrast  Result Date: 07/23/2022 CLINICAL DATA:  Left lower quadrant abdominal pain EXAM: CT ABDOMEN AND PELVIS WITH CONTRAST TECHNIQUE: Multidetector CT imaging of the abdomen and pelvis was performed using the standard protocol following bolus administration of intravenous contrast. RADIATION DOSE REDUCTION: This exam was performed according to the departmental dose-optimization program which includes automated exposure control, adjustment of the mA and/or kV according to patient size and/or use of iterative reconstruction technique. CONTRAST:  194m OMNIPAQUE IOHEXOL 300 MG/ML  SOLN COMPARISON:  CT abdomen and pelvis dated June 11, 2022 FINDINGS: Lower chest: Prior transcatheter aortic valve replacement. Partially visualized pacer wires in the right atrium and right ventricle. Bibasilar atelectasis. Hepatobiliary: No focal liver abnormality is seen. No gallstones, gallbladder wall thickening, or biliary dilatation. Pancreas: Unremarkable. No pancreatic ductal dilatation or surrounding inflammatory changes. Spleen: Normal in size without focal abnormality. Adrenals/Urinary Tract: Bilateral adrenal glands are unremarkable. No hydronephrosis or nephrolithiasis. Stable hyperdense exophytic lesion of the lower pole of the right kidney. Bladder is unremarkable. Stomach/Bowel: Stomach is within normal limits. Diverticulosis. No evidence of bowel wall thickening, distention, or inflammatory changes. Vascular/Lymphatic: Aortic atherosclerosis. No enlarged abdominal or pelvic lymph nodes. Reproductive: No adnexal masses. Other: No abdominal wall hernia or abnormality. No  abdominopelvic ascites. Musculoskeletal: No acute or significant osseous findings. IMPRESSION: 1. No acute findings in the abdomen or pelvis. 2. Diverticulosis with no evidence of diverticulitis. 3. Aortic Atherosclerosis (ICD10-I70.0). Electronically Signed   By: LYetta GlassmanM.D.   On: 07/23/2022 13:37        Scheduled Meds:  atorvastatin  80 mg Oral QPM   Chlorhexidine Gluconate Cloth  6 each Topical Q0600   clonazePAM  0.5 mg Oral QHS   isosorbide mononitrate  15 mg Oral Daily   levothyroxine  100 mcg Oral Q0600   losartan  12.5 mg Oral Daily   metoprolol tartrate  12.5 mg Oral BID   multivitamin with minerals  1 tablet Oral Daily   mupirocin ointment  1 Application Nasal BID   polyethylene glycol  17 g Oral Daily   potassium chloride  10 mEq Oral QODAY   sertraline  125 mg Oral Daily   traZODone  50 mg Oral QHS   Continuous Infusions:  sodium chloride 100 mL/hr at 07/24/22 0537    Assessment & Plan:   Principal Problem:   GI bleeding Active Problems:   Type 2 diabetes  mellitus without complications (HCC)   Anxiety and depression   Hypothyroidism   Pressure injury of skin   Dyslipidemia   Coronary artery disease   GI bleeding - The patient will be  admitted to a PCU bed. - She has mild acute blood loss anemia. - We will follow serial H&H. - We will stop her aspirin and Plavix given for recent TIA. 8/22 GI consulted Plan for cscope Ok to place on clears Bowel regimen Two large bore iv's Hold plavix Transfer Hg 7 or less   Type 2 diabetes mellitus without complications (HCC) BG stable Continue R-ISS   Anxiety and depression Continue Zoloft and trazodone     Coronary artery disease continue statins, beta-blocker  Plavix and aspirin on hold   dyslipidemia Continue statins   Hypothyroidism Continue Synthroid     DVT prophylaxis: SCD Code Status: Full Family Communication: None at bedside Disposition Plan: PT OT consult Status is:  Inpatient Remains inpatient appropriate because: IV treatment.  Work-up pending        LOS: 1 day   Time spent: 35 min    Nolberto Hanlon, MD [facility] Pager [phone]  If 7PM-7AM, please contact night-coverage 07/24/2022, 8:08 AM

## 2022-07-24 NOTE — Plan of Care (Signed)
  Problem: Education: Goal: Knowledge of General Education information will improve Description: Including pain rating scale, medication(s)/side effects and non-pharmacologic comfort measures 07/24/2022 1126 by Clydene Laming, RN Outcome: Progressing 07/24/2022 1126 by Clydene Laming, RN Outcome: Progressing   Problem: Health Behavior/Discharge Planning: Goal: Ability to manage health-related needs will improve 07/24/2022 1126 by Clydene Laming, RN Outcome: Progressing 07/24/2022 1126 by Clydene Laming, RN Outcome: Progressing   Problem: Clinical Measurements: Goal: Ability to maintain clinical measurements within normal limits will improve 07/24/2022 1126 by Clydene Laming, RN Outcome: Progressing 07/24/2022 1126 by Clydene Laming, RN Outcome: Progressing Goal: Will remain free from infection 07/24/2022 1126 by Clydene Laming, RN Outcome: Progressing 07/24/2022 1126 by Clydene Laming, RN Outcome: Progressing Goal: Diagnostic test results will improve 07/24/2022 1126 by Clydene Laming, RN Outcome: Progressing 07/24/2022 1126 by Clydene Laming, RN Outcome: Progressing Goal: Respiratory complications will improve 07/24/2022 1126 by Clydene Laming, RN Outcome: Progressing 07/24/2022 1126 by Clydene Laming, RN Outcome: Progressing Goal: Cardiovascular complication will be avoided 07/24/2022 1126 by Clydene Laming, RN Outcome: Progressing 07/24/2022 1126 by Clydene Laming, RN Outcome: Progressing   Problem: Activity: Goal: Risk for activity intolerance will decrease 07/24/2022 1126 by Clydene Laming, RN Outcome: Progressing 07/24/2022 1126 by Clydene Laming, RN Outcome: Progressing   Problem: Nutrition: Goal: Adequate nutrition will be maintained 07/24/2022 1126 by Clydene Laming, RN Outcome: Progressing 07/24/2022 1126 by Clydene Laming, RN Outcome: Progressing   Problem: Coping: Goal: Level of anxiety will  decrease 07/24/2022 1126 by Clydene Laming, RN Outcome: Progressing 07/24/2022 1126 by Clydene Laming, RN Outcome: Progressing   Problem: Elimination: Goal: Will not experience complications related to bowel motility 07/24/2022 1126 by Clydene Laming, RN Outcome: Progressing 07/24/2022 1126 by Clydene Laming, RN Outcome: Progressing Goal: Will not experience complications related to urinary retention 07/24/2022 1126 by Clydene Laming, RN Outcome: Progressing 07/24/2022 1126 by Clydene Laming, RN Outcome: Progressing   Problem: Pain Managment: Goal: General experience of comfort will improve 07/24/2022 1126 by Clydene Laming, RN Outcome: Progressing 07/24/2022 1126 by Clydene Laming, RN Outcome: Progressing   Problem: Safety: Goal: Ability to remain free from injury will improve 07/24/2022 1126 by Clydene Laming, RN Outcome: Progressing 07/24/2022 1126 by Clydene Laming, RN Outcome: Progressing   Problem: Skin Integrity: Goal: Risk for impaired skin integrity will decrease 07/24/2022 1126 by Clydene Laming, RN Outcome: Progressing 07/24/2022 1126 by Clydene Laming, RN Outcome: Progressing

## 2022-07-24 NOTE — Care Plan (Signed)
No GI bleeding overnight but no bowel movements. Given next available outpatient evaluation is some time away, will plan on inpatient colonoscopy tomorrow given drop in blood count and strong family history of colon cancer.  - clears today - NPO at midnight except prep - further recs after procedure tomorrow  Raylene Miyamoto MD, MPH [place]

## 2022-07-25 ENCOUNTER — Inpatient Hospital Stay: Payer: Medicare Other | Admitting: Anesthesiology

## 2022-07-25 ENCOUNTER — Encounter
Admission: EM | Disposition: A | Discharge status: Skilled Nursing Facility | Payer: Medicare Other | Source: Home / Self Care | Attending: Internal Medicine

## 2022-07-25 ENCOUNTER — Other Ambulatory Visit: Payer: Self-pay

## 2022-07-25 DIAGNOSIS — D62 Acute posthemorrhagic anemia: Secondary | ICD-10-CM | POA: Diagnosis not present

## 2022-07-25 DIAGNOSIS — K922 Gastrointestinal hemorrhage, unspecified: Secondary | ICD-10-CM | POA: Diagnosis not present

## 2022-07-25 DIAGNOSIS — E119 Type 2 diabetes mellitus without complications: Secondary | ICD-10-CM | POA: Diagnosis not present

## 2022-07-25 DIAGNOSIS — E440 Moderate protein-calorie malnutrition: Secondary | ICD-10-CM | POA: Insufficient documentation

## 2022-07-25 HISTORY — PX: COLONOSCOPY WITH PROPOFOL: SHX5780

## 2022-07-25 LAB — GLUCOSE, CAPILLARY
Glucose-Capillary: 86 mg/dL (ref 70–99)
Glucose-Capillary: 85 mg/dL (ref 70–99)
Glucose-Capillary: 91 mg/dL (ref 70–99)
Glucose-Capillary: 88 mg/dL (ref 70–99)
Glucose-Capillary: 80 mg/dL (ref 70–99)
Glucose-Capillary: 79 mg/dL (ref 70–99)
Glucose-Capillary: 101 mg/dL — ABNORMAL HIGH (ref 70–99)

## 2022-07-25 LAB — HEMOGLOBIN AND HEMATOCRIT, BLOOD
HCT: 29.9 % — ABNORMAL LOW (ref 36.0–46.0)
Hemoglobin: 9.2 g/dL — ABNORMAL LOW (ref 12.0–15.0)

## 2022-07-25 SURGERY — COLONOSCOPY WITH PROPOFOL
Anesthesia: General

## 2022-07-25 MED ORDER — PHENYLEPHRINE HCL (PRESSORS) 10 MG/ML IV SOLN
INTRAVENOUS | Status: DC | PRN
  Administered 2022-07-25 (×4): 80 ug via INTRAVENOUS

## 2022-07-25 MED ORDER — PROPOFOL 500 MG/50ML IV EMUL
INTRAVENOUS | Status: DC | PRN
  Administered 2022-07-25: 50 ug/kg/min via INTRAVENOUS

## 2022-07-25 MED ORDER — MORPHINE SULFATE (PF) 2 MG/ML IV SOLN
1.0000 mg | INTRAVENOUS | Status: DC | PRN
  Administered 2022-07-25 – 2022-07-28 (×5): 1 mg via INTRAVENOUS
  Filled 2022-07-25 (×5): qty 1

## 2022-07-25 MED ORDER — PROPOFOL 10 MG/ML IV BOLUS
INTRAVENOUS | Status: DC | PRN
  Administered 2022-07-25 (×3): 20 mg via INTRAVENOUS

## 2022-07-25 MED ORDER — POTASSIUM CHLORIDE CRYS ER 20 MEQ PO TBCR
20.0000 meq | EXTENDED_RELEASE_TABLET | Freq: Once | ORAL | Status: AC
  Administered 2022-07-25: 20 meq via ORAL
  Filled 2022-07-25 (×2): qty 1

## 2022-07-25 MED ORDER — SODIUM CHLORIDE 0.9 % IV SOLN: INTRAVENOUS | Status: DC

## 2022-07-25 MED ORDER — ENSURE ENLIVE PO LIQD
237.0000 mL | Freq: Two times a day (BID) | ORAL | Status: DC
Start: 2022-07-26 — End: 2022-07-29
  Administered 2022-07-26 – 2022-07-28 (×6): 237 mL via ORAL

## 2022-07-25 NOTE — Progress Notes (Addendum)
PT Cancellation Note  Patient Details Name: Catherine [name] MRN: [id number] DOB: August 10, 1941   Cancelled Treatment:    Reason Eval/Treat Not Completed: Patient at procedure or test/unavailable Attempted to see pt this AM, on arrival she stated she had called out for assist with cleaning up a BM, later she is out of room for colonoscopy.  Will maintain on caseload and attempt to treat as appropriate.  Kreg Shropshire, DPT 07/25/2022, 1:42 PM

## 2022-07-25 NOTE — Anesthesia Postprocedure Evaluation (Signed)
Anesthesia Post Note  Patient: Catherine [name]  Procedure(s) Performed: COLONOSCOPY WITH PROPOFOL  Patient location during evaluation: Endoscopy Anesthesia Type: General Level of consciousness: awake and alert Pain management: pain level controlled Vital Signs Assessment: post-procedure vital signs reviewed and stable Respiratory status: spontaneous breathing, nonlabored ventilation, respiratory function stable and patient connected to nasal cannula oxygen Cardiovascular status: blood pressure returned to baseline and stable Postop Assessment: no apparent nausea or vomiting Anesthetic complications: no   No notable events documented.   Last Vitals:  Vitals:   07/25/22 1431 07/25/22 1451  BP: (!) 111/39 (!) 118/43  Pulse:    Resp: 20   Temp: 36.8 C   SpO2:      Last Pain:  Vitals:   07/25/22 1431  TempSrc: Temporal  PainSc:                  Arita Miss

## 2022-07-25 NOTE — Progress Notes (Addendum)
PROGRESS NOTE    Catherine [name]  LFY:[id number] DOB: [birth year] DOA: 07/23/2022 PCP: Danelle Berry, NP  Assessment & Plan:   Principal Problem:   GI bleeding Active Problems:   Type 2 diabetes mellitus without complications (HCC)   Anxiety and depression   Hypothyroidism   Pressure injury of skin   Dyslipidemia   Coronary artery disease  Assessment and Plan:   GI bleeding: etiology unclear. Colonoscopy today as per GI. H&H are labile.   Acute blood loss anemia: secondary to above. Will transfuse if Hb < 7.0   DM2: likely well controlled. Continue on SSI w/ accuchecks    Depression: severity unknown. Continue on home dose of sertraline    Hx of CAD: continue on metoprolol, losartan, statin. Holding aspirin, plavix    HLD: continue on statin    Hypothyroidism: continue on synthroid   Moderate malnutrition: continue w/ nutritional supplements   Hypokalemia: potassium ordered    DVT prophylaxis: SCDs Code Status: full  Family Communication:  Disposition Plan: depends on PT/OT recs   Level of care: Progressive  Status is: Inpatient Remains inpatient appropriate because: severity of illness    Consultants:  GI   Procedures:   Antimicrobials:    Subjective: Pt c/o multiple bowel movements   Objective: Vitals:   07/24/22 2025 07/25/22 0014 07/25/22 0419 07/25/22 0741  BP: (!) 177/61 (!) 120/44 121/76 (!) 152/57  Pulse: 100 74 66 76  Resp: '16 18 14 18  '$ Temp: 98.8 F (37.1 C) 98.2 F (36.8 C) 98.6 F (37 C) 97.7 F (36.5 C)  TempSrc:  Oral Oral Oral  SpO2: 99% 99% 98% 97%  Weight:      Height:        Intake/Output Summary (Last 24 hours) at 07/25/2022 0928 Last data filed at 07/25/2022 0453 Gross per 24 hour  Intake 769.74 ml  Output 2800 ml  Net -2030.26 ml   Filed Weights   07/23/22 1035 07/23/22 1651  Weight: 63.1 kg 63 kg    Examination:  General exam: Appears calm and comfortable  Respiratory system: Clear to auscultation.  Respiratory effort normal. Cardiovascular system: S1 & S2+ No rubs, gallops or clicks.  Gastrointestinal system: Abdomen is nondistended, soft and nontender. Normal bowel sounds heard. Central nervous system: Alert and oriented. Moves all extremities  Psychiatry: Judgement and insight appear normal. Flat mood and affect     Data Reviewed: I have personally reviewed following labs and imaging studies  CBC: Recent Labs  Lab 07/23/22 1109 07/23/22 1718 07/23/22 2051 07/23/22 2205 07/24/22 0519  WBC 13.7*  --   --   --  7.6  NEUTROABS 10.4*  --   --   --   --   HGB 11.1* 10.5* 10.0* 9.7* 9.2*  HCT 36.0 33.7* 32.0* 31.2* 29.5*  MCV 91.8  --   --   --  91.6  PLT 346  --   --   --  852   Basic Metabolic Panel: Recent Labs  Lab 07/23/22 1109 07/24/22 0519  NA 136 138  K 4.8 3.4*  CL 105 112*  CO2 24 22  GLUCOSE 98 78  BUN 15 9  CREATININE 0.79 0.54  CALCIUM 9.2 8.0*   GFR: Estimated Creatinine Clearance: 46.5 mL/min (by C-G formula based on SCr of 0.54 mg/dL). Liver Function Tests: Recent Labs  Lab 07/23/22 1109  AST 26  ALT 15  ALKPHOS 62  BILITOT 1.0  PROT 6.6  ALBUMIN 3.6   Recent Labs  Lab 07/23/22 1109  LIPASE 39   No results for input(s): "AMMONIA" in the last 168 hours. Coagulation Profile: No results for input(s): "INR", "PROTIME" in the last 168 hours. Cardiac Enzymes: No results for input(s): "CKTOTAL", "CKMB", "CKMBINDEX", "TROPONINI" in the last 168 hours. BNP (last 3 results) No results for input(s): "PROBNP" in the last 8760 hours. HbA1C: No results for input(s): "HGBA1C" in the last 72 hours. CBG: Recent Labs  Lab 07/24/22 1535 07/24/22 2113 07/25/22 0150 07/25/22 0614 07/25/22 0800  GLUCAP 135* 90 86 85 91   Lipid Profile: No results for input(s): "CHOL", "HDL", "LDLCALC", "TRIG", "CHOLHDL", "LDLDIRECT" in the last 72 hours. Thyroid Function Tests: No results for input(s): "TSH", "T4TOTAL", "FREET4", "T3FREE", "THYROIDAB" in the  last 72 hours. Anemia Panel: No results for input(s): "VITAMINB12", "FOLATE", "FERRITIN", "TIBC", "IRON", "RETICCTPCT" in the last 72 hours. Sepsis Labs: No results for input(s): "PROCALCITON", "LATICACIDVEN" in the last 168 hours.  Recent Results (from the past 240 hour(s))  MRSA Next Gen by PCR, Nasal     Status: Abnormal   Collection Time: 07/23/22  6:56 PM   Specimen: Nasal Mucosa; Nasal Swab  Result Value Ref Range Status   MRSA by PCR Next Gen DETECTED (A) NOT DETECTED Final    Comment: RESULT CALLED TO, READ BACK BY AND VERIFIED WITH: MARISSA GRAHAM 07/23/22 2048 MU (NOTE) The GeneXpert MRSA Assay (FDA approved for NASAL specimens only), is one component of a comprehensive MRSA colonization surveillance program. It is not intended to diagnose MRSA infection nor to guide or monitor treatment for MRSA infections. Test performance is not FDA approved in patients less than 29 years old. Performed at [facility], [address] [place], [place] [address]          Radiology Studies: CT Abdomen Pelvis W Contrast  Result Date: 07/23/2022 CLINICAL DATA:  Left lower quadrant abdominal pain EXAM: CT ABDOMEN AND PELVIS WITH CONTRAST TECHNIQUE: Multidetector CT imaging of the abdomen and pelvis was performed using the standard protocol following bolus administration of intravenous contrast. RADIATION DOSE REDUCTION: This exam was performed according to the departmental dose-optimization program which includes automated exposure control, adjustment of the mA and/or kV according to patient size and/or use of iterative reconstruction technique. CONTRAST:  182m OMNIPAQUE IOHEXOL 300 MG/ML  SOLN COMPARISON:  CT abdomen and pelvis dated June 11, 2022 FINDINGS: Lower chest: Prior transcatheter aortic valve replacement. Partially visualized pacer wires in the right atrium and right ventricle. Bibasilar atelectasis. Hepatobiliary: No focal liver abnormality is seen. No gallstones,  gallbladder wall thickening, or biliary dilatation. Pancreas: Unremarkable. No pancreatic ductal dilatation or surrounding inflammatory changes. Spleen: Normal in size without focal abnormality. Adrenals/Urinary Tract: Bilateral adrenal glands are unremarkable. No hydronephrosis or nephrolithiasis. Stable hyperdense exophytic lesion of the lower pole of the right kidney. Bladder is unremarkable. Stomach/Bowel: Stomach is within normal limits. Diverticulosis. No evidence of bowel wall thickening, distention, or inflammatory changes. Vascular/Lymphatic: Aortic atherosclerosis. No enlarged abdominal or pelvic lymph nodes. Reproductive: No adnexal masses. Other: No abdominal wall hernia or abnormality. No abdominopelvic ascites. Musculoskeletal: No acute or significant osseous findings. IMPRESSION: 1. No acute findings in the abdomen or pelvis. 2. Diverticulosis with no evidence of diverticulitis. 3. Aortic Atherosclerosis (ICD10-I70.0). Electronically Signed   By: LYetta GlassmanM.D.   On: 07/23/2022 13:37        Scheduled Meds:  atorvastatin  80 mg Oral QPM   Chlorhexidine Gluconate Cloth  6 each Topical Q0600   clonazePAM  0.5 mg Oral  QHS   feeding supplement  1 Container Oral TID BM   insulin aspart  0-9 Units Subcutaneous Q4H   isosorbide mononitrate  15 mg Oral Daily   levothyroxine  100 mcg Oral Q0600   losartan  12.5 mg Oral Daily   metoprolol tartrate  12.5 mg Oral BID   multivitamin with minerals  1 tablet Oral Daily   mupirocin ointment  1 Application Nasal BID   polyethylene glycol  17 g Oral Daily   potassium chloride  10 mEq Oral QODAY   sertraline  125 mg Oral Daily   traZODone  50 mg Oral QHS   Continuous Infusions:  sodium chloride 100 mL/hr at 07/24/22 2351     LOS: 2 days    Time spent: 35 mins    Wyvonnia Dusky, MD [facility] Pager [phone]  If 7PM-7AM, please contact night-coverage [email or web address] 07/25/2022, 9:28 AM

## 2022-07-25 NOTE — Progress Notes (Signed)
Initial Nutrition Assessment  DOCUMENTATION CODES:   Non-severe (moderate) malnutrition in context of chronic illness  INTERVENTION:   -Liberalize diet to carb modified for wider variety of meal selections -MVI with minerals daily -Ensure Enlive po BID, each supplement provides 350 kcal and 20 grams of protein  NUTRITION DIAGNOSIS:   Moderate Malnutrition related to chronic illness (CHF) as evidenced by mild fat depletion, moderate fat depletion, mild muscle depletion, moderate muscle depletion.  GOAL:   Patient will meet greater than or equal to 90% of their needs  MONITOR:   PO intake, Supplement acceptance, Diet advancement  REASON FOR ASSESSMENT:   Malnutrition Screening Tool    ASSESSMENT:   Pt with medical history significant for coronary artery disease, type 2 diabetes mellitus, hypertension, dyslipidemia, hypothyroidism, IBS, rheumatoid arthritis, s/p TAVR and chronic diastolic CHF, and recent TIA  who presented with acute onset of rectal bleeding  Pt admitted with GIB.   Reviewed I/O's: -2 L x 24 hours and -959 ml since admission  UOP: 2.8 L x 24 hours   Plan for colonoscopy today. Pt is currently NPO for procedure.   Pt reports felling well today. She is understanding of NPO diet order. Pt shares that she has a fair appetite PTA (Breakfast: eggs and toast; Lunch: vegetable plate; Dinner: meat, starch, and vegetable). She does not any issues chewing or swallowing foods.   Per pt, she estimates she has lost about 15 pounds over the past 3 months, which she suspects is related to eating less than she used to. Pt has experienced a 7.4% wt loss over the past 1 month, which is significant for time frame.   Discussed importance of good meal and supplement intake to promote healing. Pt amenable to supplements with diet advancement.   Medications reviewed and include miralax and potassium chloride.   Lab Results  Component Value Date   HGBA1C 5.4 07/08/2022   PTA  DM medications are none.   Labs reviewed: CBGS: 80-91 (inpatient orders for glycemic control are 0-9 units insulin aspart every 4 hours).    NUTRITION - FOCUSED PHYSICAL EXAM:  Flowsheet Row Most Recent Value  Orbital Region Moderate depletion  Upper Arm Region Moderate depletion  Thoracic and Lumbar Region Mild depletion  Buccal Region Mild depletion  Temple Region Moderate depletion  Clavicle Bone Region Moderate depletion  Clavicle and Acromion Bone Region Moderate depletion  Scapular Bone Region Moderate depletion  Dorsal Hand Mild depletion  Patellar Region Mild depletion  Anterior Thigh Region Mild depletion  Posterior Calf Region Mild depletion  Edema (RD Assessment) None  Hair Reviewed  Eyes Reviewed  Mouth Reviewed  Skin Reviewed  Nails Reviewed       Diet Order:   Diet Order             Diet Heart Room service appropriate? Yes; Fluid consistency: Thin  Diet effective now                   EDUCATION NEEDS:   Education needs have been addressed  Skin:  Skin Assessment: Skin Integrity Issues: Skin Integrity Issues:: Stage II Stage II: sacrum  Last BM:  07/25/22 (type 1)  Height:   Ht Readings from Last 1 Encounters:  07/23/22 '5\' 2"'$  (1.575 m)    Weight:   Wt Readings from Last 1 Encounters:  07/23/22 63 kg    Ideal Body Weight:  50 kg  BMI:  Body mass index is 25.41 kg/m.  Estimated Nutritional Needs:  Kcal:  1700-1900  Protein:  90-105 grams  Fluid:  > 1.7 L    Loistine Chance, RD, LDN, [place] Registered Dietitian II Certified Diabetes Care and Education Specialist Please refer to [facility] for RD and/or RD on-call/weekend/after hours pager

## 2022-07-25 NOTE — Transfer of Care (Signed)
Immediate Anesthesia Transfer of Care Note  Patient: Catherine [name]  Procedure(s) Performed: COLONOSCOPY WITH PROPOFOL  Patient Location: PACU  Anesthesia Type:General  Level of Consciousness: awake, alert  and oriented  Airway & Oxygen Therapy: Patient Spontanous Breathing and Patient connected to nasal cannula oxygen  Post-op Assessment: Report given to RN and Post -op Vital signs reviewed and stable  Post vital signs: Reviewed and stable  Last Vitals:  Vitals Value Taken Time  BP 111/39 07/25/22 1435  Temp 36.8 C 07/25/22 1431  Pulse 59 07/25/22 1436  Resp 18 07/25/22 1436  SpO2 99 % 07/25/22 1436  Vitals shown include unvalidated device data.  Last Pain:  Vitals:   07/25/22 1431  TempSrc: Temporal  PainSc:       Patients Stated Pain Goal: 0 (91/36/85 9923)  Complications: No notable events documented.

## 2022-07-25 NOTE — Care Plan (Signed)
Colonoscopy showed bleeding avm that was treated with APC. Sigmoid/descending colon polyp seen and not removed due to plavix use. [place] for [employer], ok to resume plavix if indicated. Will need to f/u as scheduled with her GI physician to discuss repeat colonoscopy for removal of polyp.  Raylene Miyamoto MD, MPH [place]

## 2022-07-25 NOTE — Anesthesia Preprocedure Evaluation (Signed)
Anesthesia Evaluation  Patient identified by MRN, date of birth, ID band Patient awake    Airway Mallampati: II  TM Distance: >3 FB     Dental  (+) Upper Dentures, Lower Dentures   Pulmonary shortness of breath and with exertion,    breath sounds clear to auscultation       Cardiovascular hypertension, + CAD and +CHF  Normal cardiovascular exam+ dysrhythmias + pacemaker + Valvular Problems/Murmurs  Rhythm:Regular  S/p artificial aortic valve   Neuro/Psych PSYCHIATRIC DISORDERS Depression TIACVA    GI/Hepatic Neg liver ROS, History noted. CG   Endo/Other  diabetesHypothyroidism   Renal/GU negative Renal ROS     Musculoskeletal  (+) Arthritis , Fibromyalgia -  Abdominal   Peds negative pediatric ROS (+)  Hematology  (+) Blood dyscrasia, anemia ,   Anesthesia Other Findings Past Medical History: No date: Acute colitis No date: Anemia No date: CAD (coronary artery disease)     Comment:  a. heavy calcification of the entire LAD & mod eccentric              mid LAD stenosis, estimated @ 70%, mild nonobs stenosis               of RCA and LCx, severely calcified Ao valve with               restricted valve mobility and 2+ AI   1981: Cancer (HCC)     Comment:  breast 08/31/2015: CHB (complete heart block) ([place])     Comment:  St Jude Medical Assurity DR model [phone] (serial               number [id number] )  No date: Chronic diastolic congestive heart failure (HCC)     Comment:  a. echo 08/31/2015: EF 65-70%, nl WM, GR1DD, Ao valve               stent bioprosthesis was present and functioning nl, no               regurg, LA mildly dilated, PASP 46 mm Hg No date: Collagen vascular disease (HCC) No date: Colon polyps No date: Depression No date: Diabetes mellitus type II No date: Fibromyalgia No date: HTN (hypertension) No date: Hypercholesteremia No date: Hypothyroidism No date: IBS (irritable bowel syndrome) No  date: Presence of permanent cardiac pacemaker No date: Rectal bleeding No date: Rheumatoid arthritis(714.0) 08/30/2015: S/P TAVR (transcatheter aortic valve replacement)     Comment:  26 mm Edwards Sapien 3 transcatheter heart valve placed               via open right transfemoral approach No date: Shortness of breath dyspnea No date: Stenosis of aortic valve No date: Suicide attempt ([place]) No date: Thyroid disease  Reproductive/Obstetrics                             Anesthesia Physical  Anesthesia Plan  ASA: 3  Anesthesia Plan: General   Post-op Pain Management: Minimal or no pain anticipated   Induction: Intravenous  PONV Risk Score and Plan: 3 and Propofol infusion  Airway Management Planned: Nasal Cannula  Additional Equipment: None  Intra-op Plan:   Post-operative Plan:   Informed Consent: I have reviewed the patients History and Physical, chart, labs and discussed the procedure including the risks, benefits and alternatives for the proposed anesthesia with the patient or authorized representative who has indicated his/her understanding and acceptance.  Dental advisory given  Plan Discussed with: CRNA and Anesthesiologist  Anesthesia Plan Comments: (Discussed risks of anesthesia with patient, including possibility of difficulty with spontaneous ventilation under anesthesia necessitating airway intervention, PONV, and rare risks such as cardiac or respiratory or neurological events, and allergic reactions. Discussed the role of CRNA in patient's perioperative care. Patient understands.)        Anesthesia Quick Evaluation

## 2022-07-25 NOTE — Evaluation (Signed)
Occupational Therapy Evaluation Patient Details Name: Catherine [name] MRN: [id number] DOB: Nov 28, 1941 Today's Date: 07/25/2022   History of Present Illness Catherine [name] is a 81 y.o. Caucasian female with medical history significant for coronary artery disease, type 2 diabetes mellitus, hypertension, dyslipidemia, hypothyroidism, IBS, rheumatoid arthritis, s/p TAVR and chronic diastolic CHF, and recent TIA and hospitalization at [facility], where she was placed on dual antiplatelet therapy with aspirin and Plavix and discharged on 07/16/2022. She presents to [facility] ER with acute onset of rectal bleeding. She has been having maroon stools with chronic left-sided abdominal pain that is unchanged for the last month. She admits to nausea without vomiting or heartburn. No fever or chills. No current chest pain or palpitations. No dyspnea or cough or wheezing or hemoptysis. She has mild dysuria without oliguria or hematuria or flank pain. No other bleeding diathesis.   Clinical Impression   Catherine [name] presents with generalized weakness, limited endurance, impaired balance, and mild confusion. She comes to [facility] from [facility], where she has been participating in rehab since 8/14, following hospitalization at [facility] for AMS, generalized weakness, beginning 8/7. Prior to the [facility] hospitalization, pt lived at home with her husband, with regular visits from children living nearby. Husband assisted pt with dressing, bathing, provided reg SUPV for safety. Pt ambulates with RW, endorses 1 fall in previous year. Husband handles all driving, shopping, housework, w/ pt reporting she feels too unsteady to participate in these IADLs. During today's evaluation, pt is alert and oriented to place, is unclear why she is here at [facility] and defers to her husband to provide info re: living situation and PLOF. She is able to perform bed mobility w/ physical assist but with increased time and effort, requires MinA for sit<>stand and  transfers, fair sitting and standing balance, close SUPV for safety 2/2 unsteadiness in standing. Max A for LB dressing. Max A for pericare post BM in bed, of which pt is unaware. Recommend ongoing OT while hospitalized, with DC back to SNF to continue rehabilitation process.    Recommendations for follow up therapy are one component of a multi-disciplinary discharge planning process, led by the attending physician.  Recommendations may be updated based on patient status, additional functional criteria and insurance authorization.   Follow Up Recommendations  Skilled nursing-short term rehab (<3 hours/day)    Assistance Recommended at Discharge Frequent or constant Supervision/Assistance  Patient can return home with the following A little help with walking and/or transfers;A little help with bathing/dressing/bathroom;Direct supervision/assist for medications management;Assistance with cooking/housework;Assist for transportation    Functional Status Assessment  Patient has had a recent decline in their functional status and demonstrates the ability to make significant improvements in function in a reasonable and predictable amount of time.  Equipment Recommendations  None recommended by OT    Recommendations for Other Services       Precautions / Restrictions Precautions Precautions: Fall Restrictions Weight Bearing Restrictions: No      Mobility Bed Mobility Overal bed mobility: Needs Assistance Bed Mobility: Supine to Sit     Supine to sit: Supervision          Transfers Overall transfer level: Needs assistance Equipment used: 1 person hand held assist Transfers: Sit to/from Stand, Bed to chair/wheelchair/BSC Sit to Stand: Min assist     Step pivot transfers: Min assist     General transfer comment: increased time, effort; close SUPV for safety      Balance Overall balance assessment: Needs assistance  Sitting-balance support: Feet supported Sitting  balance-Leahy Scale: Fair     Standing balance support: Bilateral upper extremity supported, During functional activity Standing balance-Leahy Scale: Fair                             ADL either performed or assessed with clinical judgement   ADL Overall ADL's : Needs assistance/impaired                     Lower Body Dressing: Maximal assistance       Toileting- Clothing Manipulation and Hygiene: Maximal assistance Toileting - Clothing Manipulation Details (indicate cue type and reason): patient soiled and unaware, requiring Max A peri-care             Vision Patient Visual Report: No change from baseline       Perception     Praxis      Pertinent Vitals/Pain Pain Assessment Pain Score: 0-No pain     Hand Dominance Right   Extremity/Trunk Assessment Upper Extremity Assessment Upper Extremity Assessment: Generalized weakness   Lower Extremity Assessment Lower Extremity Assessment: Generalized weakness       Communication Communication Communication: No difficulties   Cognition Arousal/Alertness: Awake/alert Behavior During Therapy: WFL for tasks assessed/performed Overall Cognitive Status: Within Functional Limits for tasks assessed                                 General Comments: Generally WFL. Has some difficulty following conversation, requires repetition of instructions.     General Comments       Exercises Other Exercises Other Exercises: Seated UE, LE therex. Educ re: POC, DC recs, falls prevention   Shoulder Instructions      Home Living Family/patient expects to be discharged to:: Private residence Living Arrangements: Spouse/significant other Available Help at Discharge: Family Type of Home: House Home Access: Stairs to enter [employer] of Steps: 4-5 Entrance Stairs-Rails: Right;Left Home Layout: Two level;Able to live on main level with bedroom/bathroom     Bathroom Shower/Tub:  Occupational psychologist: Standard     Home Equipment: Conservation officer, nature (2 wheels);Grab bars - tub/shower;Shower seat          Prior Functioning/Environment Prior Level of Function : Needs assist             Mobility Comments: RW for ambulation, reports 1 fall in previous 12 months ADLs Comments: Receives asst from spouse for showering, SUPV during ambulation. Spouse does all shopping, cooking, housework. Pt no longer drives        OT Problem List: Decreased strength;Decreased range of motion;Decreased activity tolerance;Decreased cognition      OT Treatment/Interventions: Self-care/ADL training;Therapeutic exercise;Energy conservation;DME and/or AE instruction;Therapeutic activities;Patient/family education;Balance training    OT Goals(Current goals can be found in the care plan section) Acute Rehab OT Goals Patient Stated Goal: to be able to go home soon OT Goal Formulation: With patient Time For Goal Achievement: 08/08/22 Potential to Achieve Goals: Good ADL Goals Pt Will Perform Grooming: with supervision;standing Pt Will Perform Upper Body Dressing: sitting;with modified independence Pt Will Transfer to Toilet: with min guard assist;ambulating;regular height toilet  OT Frequency: Min 2X/week    Co-evaluation              AM-PAC OT "6 Clicks" Daily Activity     Outcome Measure Help from another person eating meals?: A  Little Help from another person taking care of personal grooming?: A Little Help from another person toileting, which includes using toliet, bedpan, or urinal?: A Lot Help from another person bathing (including washing, rinsing, drying)?: A Lot Help from another person to put on and taking off regular upper body clothing?: A Little Help from another person to put on and taking off regular lower body clothing?: A Lot 6 Click Score: 15   End of Session    Activity Tolerance: Patient tolerated treatment well Patient left: in chair;with  call bell/phone within reach;with family/visitor present  OT Visit Diagnosis: Unsteadiness on feet (R26.81);Muscle weakness (generalized) (M62.81);Other symptoms and signs involving cognitive function                Time: 3734-2876 OT Time Calculation (min): 18 min Charges:  OT General Charges $OT Visit: 1 Visit OT Evaluation $OT Eval Moderate Complexity: 1 Mod OT Treatments $Self Care/Home Management : 8-22 mins Josiah Lobo, PhD, MS, OTR/L 07/25/22, 5:39 PM
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
## 2022-07-25 NOTE — Progress Notes (Signed)
OT Cancellation Note  Patient Details Name: Catherine [name] MRN: [id number] DOB: 10-06-1941   Cancelled Treatment:    Reason Eval/Treat Not Completed: Patient at procedure or test/ unavailable. Pt off floor for colonoscopy. Will re-attempt OT evaluation at a later time/date, as pt is available.  Josiah Lobo 07/25/2022, 2:21 PM

## 2022-07-25 NOTE — Op Note (Addendum)
[facility] Gastroenterology Patient Name: Catherine [name] Procedure Date: 07/25/2022 2:00 PM MRN: [id number] Account #: [id number] Date of Birth: [birth year] Admit Type: Inpatient Age: 82 Room: [facility] ENDO ROOM 2 Gender: Female Note Status: Supervisor Override Instrument Name: Peds Colonoscope [id number] Procedure:             Colonoscopy Indications:           Hematochezia Providers:             Andrey Farmer MD, MD Medicines:             Monitored Anesthesia Care Complications:         No immediate complications. Procedure:             Pre-Anesthesia Assessment:                        - Prior to the procedure, a History and Physical was                         performed, and patient medications and allergies were                         reviewed. The patient is competent. The risks and                         benefits of the procedure and the sedation options and                         risks were discussed with the patient. All questions                         were answered and informed consent was obtained.                         Patient identification and proposed procedure were                         verified by the physician, the nurse, the                         anesthesiologist, the anesthetist and the technician                         in the endoscopy suite. Mental Status Examination:                         alert and oriented. Airway Examination: normal                         oropharyngeal airway and neck mobility. Respiratory                         Examination: clear to auscultation. CV Examination:                         normal. Prophylactic Antibiotics: The patient does not                         require prophylactic antibiotics. Prior  Anticoagulants: The patient has taken no previous                         anticoagulant or antiplatelet agents. ASA Grade                         Assessment: III - A patient with severe  systemic                         disease. After reviewing the risks and benefits, the                         patient was deemed in satisfactory condition to                         undergo the procedure. The anesthesia plan was to use                         monitored anesthesia care (MAC). Immediately prior to                         administration of medications, the patient was                         re-assessed for adequacy to receive sedatives. The                         heart rate, respiratory rate, oxygen saturations,                         blood pressure, adequacy of pulmonary ventilation, and                         response to care were monitored throughout the                         procedure. The physical status of the patient was                         re-assessed after the procedure.                        After obtaining informed consent, the colonoscope was                         passed under direct vision. Throughout the procedure,                         the patient's blood pressure, pulse, and oxygen                         saturations were monitored continuously. The                         Colonoscope was introduced through the anus and                         advanced to the the cecum, identified by appendiceal  orifice and ileocecal valve. The colonoscopy was                         performed without difficulty. The patient tolerated                         the procedure well. The quality of the bowel                         preparation was fair. Findings:      The perianal and digital rectal examinations were normal.      A single medium-sized localized angiodysplastic lesion with bleeding was       found in the descending colon. Coagulation for tissue destruction using       argon beam was successful. Estimated blood loss: none.      A 8 mm polyp was found in the descending colon. The polyp was sessile.       Polypectomy was not  attempted due to the patient taking anticoagulation       medication.      A few small-mouthed diverticula were found in the sigmoid colon.      The exam was otherwise without abnormality on direct and retroflexion       views. Impression:            - Preparation of the colon was fair.                        - A single bleeding colonic angiodysplastic lesion.                         Treated with argon beam coagulation.                        - One 8 mm polyp in the descending colon. Resection                         not attempted.                        - Diverticulosis in the sigmoid colon.                        - The examination was otherwise normal on direct and                         retroflexion views.                        - No specimens collected. Recommendation:        - Return patient to hospital ward for ongoing care.                        - Advance diet as tolerated.                        - Continue present medications.                        - will need to see outpatient GI as scheduled to  discuss repeat colonoscopy for removal of descending                         colon polyp if indicated                        - ok to restart plavix Procedure Code(s):     --- Professional ---                        [phone], Colonoscopy, flexible; with control of                         bleeding, any method Diagnosis Code(s):     --- Professional ---                        K55.21, Angiodysplasia of colon with hemorrhage                        K63.5, Polyp of colon                        K92.1, Melena (includes Hematochezia)                        K57.30, Diverticulosis of large intestine without                         perforation or abscess without bleeding CPT copyright 2019 American Medical Association. All rights reserved. The codes documented in this report are preliminary and upon coder review may  be revised to meet current compliance  requirements. Andrey Farmer MD, MD 07/25/2022 2:34:36 PM Number of Addenda: 0 Note Initiated On: 07/25/2022 2:00 PM Scope Withdrawal Time: 0 hours 5 minutes 24 seconds  Total Procedure Duration: 0 hours 16 minutes 52 seconds  Estimated Blood Loss:  Estimated blood loss: none.      [facility]

## 2022-07-25 NOTE — Anesthesia Procedure Notes (Signed)
Date/Time: 07/25/2022 2:11 PM  Performed by: Nelda Marseille, CRNAPre-anesthesia Checklist: Patient identified, Emergency Drugs available, Suction available, Patient being monitored and Timeout performed Oxygen Delivery Method: Nasal cannula

## 2022-07-26 ENCOUNTER — Encounter: Payer: Self-pay | Admitting: Gastroenterology

## 2022-07-26 DIAGNOSIS — D62 Acute posthemorrhagic anemia: Secondary | ICD-10-CM | POA: Diagnosis not present

## 2022-07-26 DIAGNOSIS — K922 Gastrointestinal hemorrhage, unspecified: Secondary | ICD-10-CM | POA: Diagnosis not present

## 2022-07-26 DIAGNOSIS — E440 Moderate protein-calorie malnutrition: Secondary | ICD-10-CM | POA: Diagnosis not present

## 2022-07-26 LAB — CBC
HCT: 26.8 % — ABNORMAL LOW (ref 36.0–46.0)
Hemoglobin: 8.4 g/dL — ABNORMAL LOW (ref 12.0–15.0)
MCH: 28.7 pg (ref 26.0–34.0)
MCHC: 31.3 g/dL (ref 30.0–36.0)
MCV: 91.5 fL (ref 80.0–100.0)
Platelets: 218 10*3/uL (ref 150–400)
RBC: 2.93 MIL/uL — ABNORMAL LOW (ref 3.87–5.11)
RDW: 14.1 % (ref 11.5–15.5)
WBC: 7.4 10*3/uL (ref 4.0–10.5)
nRBC: 0 % (ref 0.0–0.2)

## 2022-07-26 LAB — BASIC METABOLIC PANEL
Anion gap: 3 — ABNORMAL LOW (ref 5–15)
BUN: 7 mg/dL — ABNORMAL LOW (ref 8–23)
CO2: 22 mmol/L (ref 22–32)
Calcium: 8.1 mg/dL — ABNORMAL LOW (ref 8.9–10.3)
Chloride: 114 mmol/L — ABNORMAL HIGH (ref 98–111)
Creatinine, Ser: 0.62 mg/dL (ref 0.44–1.00)
GFR, Estimated: >60 mL/min (ref >60)
Glucose, Bld: 82 mg/dL (ref 70–99)
Potassium: 3.1 mmol/L — ABNORMAL LOW (ref 3.5–5.1)
Sodium: 139 mmol/L (ref 135–145)

## 2022-07-26 LAB — GLUCOSE, CAPILLARY
Glucose-Capillary: 105 mg/dL — ABNORMAL HIGH (ref 70–99)
Glucose-Capillary: 117 mg/dL — ABNORMAL HIGH (ref 70–99)
Glucose-Capillary: 130 mg/dL — ABNORMAL HIGH (ref 70–99)
Glucose-Capillary: 139 mg/dL — ABNORMAL HIGH (ref 70–99)
Glucose-Capillary: 76 mg/dL (ref 70–99)
Glucose-Capillary: 79 mg/dL (ref 70–99)
Glucose-Capillary: 85 mg/dL (ref 70–99)

## 2022-07-26 MED ORDER — POTASSIUM CHLORIDE CRYS ER 20 MEQ PO TBCR
40.0000 meq | EXTENDED_RELEASE_TABLET | Freq: Two times a day (BID) | ORAL | Status: AC
Start: 2022-07-26 — End: 2022-07-26
  Administered 2022-07-26 (×2): 40 meq via ORAL
  Filled 2022-07-26 (×2): qty 2

## 2022-07-26 MED ORDER — CLOPIDOGREL BISULFATE 75 MG PO TABS
75.0000 mg | ORAL_TABLET | Freq: Every day | ORAL | Status: DC
  Administered 2022-07-26 – 2022-07-28 (×3): 75 mg via ORAL
  Filled 2022-07-26 (×3): qty 1

## 2022-07-26 NOTE — Care Management Important Message (Signed)
Important Message  Patient Details  Name: Catherine [name] MRN: [id number] Date of Birth: [birth year]   Medicare Important Message Given:  Yes     Dannette Barbara 07/26/2022, 12:56 PM

## 2022-07-26 NOTE — Progress Notes (Signed)
PROGRESS NOTE    Catherine [name]  HFW:[id number] DOB: Nov 27, 1941 DOA: 07/23/2022 PCP: Danelle Berry, NP  Assessment & Plan:   Principal Problem:   GI bleeding Active Problems:   Type 2 diabetes mellitus without complications (HCC)   Anxiety and depression   Hypothyroidism   Pressure injury of skin   Dyslipidemia   Coronary artery disease   Malnutrition of moderate degree  Assessment and Plan:   GI bleeding: secondary to a bleeding colonic angiodysplastic lesion as found on colonoscopy which was treated w/ argon beam. H&H are trending down today. Pt denies any melena or bright red blood per rectum today. GI recs apprec    Acute blood loss anemia: secondary to above. H&H are trending down   DM2: likely well controlled. Continue on SSI w/ accuchecks    Depression: severity unknown. Continue on home dose of sertraline    Hx of CAD: continue on metoprolol, losartan, statin. Holding aspirin, plavix    HLD: continue on statin    Hypothyroidism: continue on levothyroxine   Moderate malnutrition: continue w/ nutritional supplements  Hypokalemia: Kcl repleated    DVT prophylaxis: SCDs Code Status: full  Family Communication:  Disposition Plan:  likely d/c back to home facility tomorrow if H&H are stable or trending up   Level of care: Progressive  Status is: Inpatient Remains inpatient appropriate because: likely d/c back to home facility tomorrow if H&H are stable or trending up     Consultants:  GI   Procedures:   Antimicrobials:    Subjective: Pt c/o malaise   Objective: Vitals:   07/25/22 2147 07/26/22 0011 07/26/22 0443 07/26/22 0800  BP: (!) 116/39 (!) 112/38 (!) 123/53 (!) 138/58  Pulse: (!) 59 68 65 75  Resp:  '18 18 16  '$ Temp:  98.8 F (37.1 C) 99 F (37.2 C) 98.3 F (36.8 C)  TempSrc:    Oral  SpO2:  98% 98% 98%  Weight:      Height:        Intake/Output Summary (Last 24 hours) at 07/26/2022 0839 Last data filed at 07/26/2022 0277 Gross  per 24 hour  Intake 440 ml  Output 1800 ml  Net -1360 ml   Filed Weights   07/23/22 1035 07/23/22 1651  Weight: 63.1 kg 63 kg    Examination:  General exam: Appears comfortable  Respiratory system: clear breath sounds b/l  Cardiovascular system: S1/S2+. No rubs or clicks   Gastrointestinal system: Abd is soft, NT, ND & normal bowel sounds  Central nervous system: Alert and oriented. Moves all extremities  Psychiatry: Judgement and insight is at baseline. Flat mood and affect      Data Reviewed: I have personally reviewed following labs and imaging studies  CBC: Recent Labs  Lab 07/23/22 1109 07/23/22 1718 07/23/22 2051 07/23/22 2205 07/24/22 0519 07/25/22 2007 07/26/22 0435  WBC 13.7*  --   --   --  7.6  --  7.4  NEUTROABS 10.4*  --   --   --   --   --   --   HGB 11.1*   < > 10.0* 9.7* 9.2* 9.2* 8.4*  HCT 36.0   < > 32.0* 31.2* 29.5* 29.9* 26.8*  MCV 91.8  --   --   --  91.6  --  91.5  PLT 346  --   --   --  242  --  218   < > = values in this interval not displayed.   Basic  Metabolic Panel: Recent Labs  Lab 07/23/22 1109 07/24/22 0519 07/26/22 0435  NA 136 138 139  K 4.8 3.4* 3.1*  CL 105 112* 114*  CO2 '24 22 22  '$ GLUCOSE 98 78 82  BUN 15 9 7*  CREATININE 0.79 0.54 0.62  CALCIUM 9.2 8.0* 8.1*   GFR: Estimated Creatinine Clearance: 46.5 mL/min (by C-G formula based on SCr of 0.62 mg/dL). Liver Function Tests: Recent Labs  Lab 07/23/22 1109  AST 26  ALT 15  ALKPHOS 62  BILITOT 1.0  PROT 6.6  ALBUMIN 3.6   Recent Labs  Lab 07/23/22 1109  LIPASE 39   No results for input(s): "AMMONIA" in the last 168 hours. Coagulation Profile: No results for input(s): "INR", "PROTIME" in the last 168 hours. Cardiac Enzymes: No results for input(s): "CKTOTAL", "CKMB", "CKMBINDEX", "TROPONINI" in the last 168 hours. BNP (last 3 results) No results for input(s): "PROBNP" in the last 8760 hours. HbA1C: No results for input(s): "HGBA1C" in the last 72  hours. CBG: Recent Labs  Lab 07/25/22 1622 07/25/22 2031 07/26/22 0029 07/26/22 0436 07/26/22 0802  GLUCAP 79 101* 105* 79 85   Lipid Profile: No results for input(s): "CHOL", "HDL", "LDLCALC", "TRIG", "CHOLHDL", "LDLDIRECT" in the last 72 hours. Thyroid Function Tests: No results for input(s): "TSH", "T4TOTAL", "FREET4", "T3FREE", "THYROIDAB" in the last 72 hours. Anemia Panel: No results for input(s): "VITAMINB12", "FOLATE", "FERRITIN", "TIBC", "IRON", "RETICCTPCT" in the last 72 hours. Sepsis Labs: No results for input(s): "PROCALCITON", "LATICACIDVEN" in the last 168 hours.  Recent Results (from the past 240 hour(s))  MRSA Next Gen by PCR, Nasal     Status: Abnormal   Collection Time: 07/23/22  6:56 PM   Specimen: Nasal Mucosa; Nasal Swab  Result Value Ref Range Status   MRSA by PCR Next Gen DETECTED (A) NOT DETECTED Final    Comment: RESULT CALLED TO, READ BACK BY AND VERIFIED WITH: MARISSA GRAHAM 07/23/22 2048 MU (NOTE) The GeneXpert MRSA Assay (FDA approved for NASAL specimens only), is one component of a comprehensive MRSA colonization surveillance program. It is not intended to diagnose MRSA infection nor to guide or monitor treatment for MRSA infections. Test performance is not FDA approved in patients less than 8 years old. Performed at [facility], [address], [place],  [address]          Radiology Studies: No results found.      Scheduled Meds:  atorvastatin  80 mg Oral QPM   Chlorhexidine Gluconate Cloth  6 each Topical Q0600   clonazePAM  0.5 mg Oral QHS   feeding supplement  237 mL Oral BID BM   insulin aspart  0-9 Units Subcutaneous Q4H   isosorbide mononitrate  15 mg Oral Daily   levothyroxine  100 mcg Oral Q0600   losartan  12.5 mg Oral Daily   metoprolol tartrate  12.5 mg Oral BID   multivitamin with minerals  1 tablet Oral Daily   mupirocin ointment  1 Application Nasal BID   polyethylene glycol  17 g Oral Daily    sertraline  125 mg Oral Daily   traZODone  50 mg Oral QHS   Continuous Infusions:  sodium chloride 75 mL/hr at 07/25/22 1645     LOS: 3 days    Time spent: 25 mins    Wyvonnia Dusky, MD [facility] Pager [phone]  If 7PM-7AM, please contact night-coverage [email or web address] 07/26/2022, 8:39 AM
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
## 2022-07-26 NOTE — Plan of Care (Signed)

## 2022-07-26 NOTE — TOC Progression Note (Signed)
Transition of Care [facility]) - Progression Note    Patient Details  Name: Catherine [name] MRN: [id number] Date of Birth: 1941-08-18  Transition of [facility]) CM/SW Contact  Candie Chroman, LCSW Phone Number: 07/26/2022, 4:37 PM  Clinical Narrative:   [facility] can accept patient tomorrow if stable and insurance authorization obtained. Started auth in [place] portal. Asked patient about financial concerns. She asked for help with setting up payment plan for hospital bills. Financial counselor provided phone number for her to call. Will give to patient tomorrow.  Expected Discharge Plan: [place] Barriers to Discharge: Continued Medical Work up  Expected Discharge Plan and Services Expected Discharge Plan: [place] Choice: [place] arrangements for the past 2 months: Single Family Home, [place]                                       Social Determinants of Health (SDOH) Interventions    Readmission Risk Interventions    07/25/2022   10:48 AM  Readmission Risk Prevention Plan  PCP or Specialist appointment within 3-5 days of discharge Complete  SW Recovery Care/Counseling Consult Complete  Palliative Care Screening Not [place] Complete

## 2022-07-26 NOTE — Evaluation (Signed)
Physical Therapy Evaluation Patient Details Name: Catherine [name] MRN: [id number] DOB: June 08, 1941 Today's Date: 07/26/2022  History of Present Illness  Catherine [name] is a 81 y.o. Caucasian female with medical history significant for coronary artery disease, type 2 diabetes mellitus, hypertension, dyslipidemia, hypothyroidism, IBS, rheumatoid arthritis, s/p TAVR and chronic diastolic CHF, and recent TIA for which she was placed on dual antiplatelet therapy with aspirin and Plavix and discharged on 07/16/2022.  Now present with rectal bleeding. She has been having maroon stools with chronic left-sided abdominal pain that is unchanged for the last month.  Colonoscopy 8/23 revealed bleed that was treated.  Clinical Impression  Pt initially anxious to do a lot with PT did was eager to see what she could do.  Ultimately she moved relatively well, but had continued issues with loose stool and deferred doing more than the 30 ft of ambulation we did due to this.  Regardless pt is weaker and more limited than her baseline and would benefit from STR to increased strength, activity tolerance, and get back to a more functional and independent level.      Recommendations for follow up therapy are one component of a multi-disciplinary discharge planning process, led by the attending physician.  Recommendations may be updated based on patient status, additional functional criteria and insurance authorization.  Follow Up Recommendations Skilled nursing-short term rehab (<3 hours/day) Can patient physically be transported by private vehicle: Yes    Assistance Recommended at Discharge Frequent or constant Supervision/Assistance  Patient can return home with the following  A little help with walking and/or transfers;A little help with bathing/dressing/bathroom;Assistance with cooking/housework;Direct supervision/assist for medications management;Direct supervision/assist for financial management;Assist for  transportation;Help with stairs or ramp for entrance    Equipment Recommendations None recommended by PT  Recommendations for Other Services       Functional Status Assessment Patient has had a recent decline in their functional status and demonstrates the ability to make significant improvements in function in a reasonable and predictable amount of time.     Precautions / Restrictions Precautions Precautions: Fall Restrictions Weight Bearing Restrictions: No      Mobility  Bed Mobility Overal bed mobility: Needs Assistance Bed Mobility: Supine to Sit     Supine to sit: Supervision     General bed mobility comments: Pt able to get herself to sitting EOB without assist    Transfers Overall transfer level: Modified independent Equipment used: Rolling walker (2 wheels) Transfers: Sit to/from Stand Sit to Stand: Min guard           General transfer comment: Pt showed good confidence with UE use, set up and was able to rise w/o direct assist, used walker to stabilize once up    Ambulation/Gait Ambulation/Gait assistance: Min guard Gait Distance (Feet): 30 Feet Assistive device: Rolling walker (2 wheels)         General Gait Details: On standing pt had issue with loose stool, we walked to the bathroom (~26f) and after clean up, etc she did not wish to go further into the hall and requested to just walk back to the recliner - which she did safely and w/o issue  Stairs            Wheelchair Mobility    Modified Rankin (Stroke Patients Only)       Balance Overall balance assessment: Needs assistance   Sitting balance-Leahy Scale: Good     Standing balance support: Bilateral upper extremity supported, During functional activity Standing balance-Leahy  Scale: Fair                               Pertinent Vitals/Pain Pain Assessment Faces Pain Scale: Hurts little more Pain Location: rectal area    Home Living Family/patient expects to  be discharged to:: Private residence Living Arrangements: Spouse/significant other Available Help at Discharge: Family Type of Home: House Home Access: Stairs to enter Entrance Stairs-Rails: Psychiatric nurse of Steps: 4-5   Home Layout: Two level;Able to live on main level with bedroom/bathroom Home Equipment: Rolling Walker (2 wheels);Grab bars - tub/shower;Shower seat      Prior Function Prior Level of Function : Needs assist             Mobility Comments: RW for ambulation, reports 1 fall in previous 12 months ADLs Comments: Receives asst from spouse for showering. Spouse does all shopping, cooking, housework. Pt no longer drives     Hand Dominance        Extremity/Trunk Assessment   Upper Extremity Assessment Upper Extremity Assessment: Generalized weakness    Lower Extremity Assessment Lower Extremity Assessment: Generalized weakness       Communication   Communication: No difficulties  Cognition Arousal/Alertness: Awake/alert Behavior During Therapy: WFL for tasks assessed/performed Overall Cognitive Status: Within Functional Limits for tasks assessed                                          General Comments      Exercises     Assessment/Plan    PT Assessment Patient needs continued PT services  PT Problem List Decreased strength;Decreased balance;Decreased activity tolerance;Decreased safety awareness;Cardiopulmonary status limiting activity       PT Treatment Interventions DME instruction;Gait training;Functional mobility training;Therapeutic activities;Therapeutic exercise;Balance training;Neuromuscular re-education;Stair training;Patient/family education    PT Goals (Current goals can be found in the Care Plan section)  Acute Rehab PT Goals Patient Stated Goal: get stronger at rehab PT Goal Formulation: With patient Time For Goal Achievement: 08/08/22 Potential to Achieve Goals: Good    Frequency Min  2X/week     Co-evaluation               AM-PAC PT "6 Clicks" Mobility  Outcome Measure Help needed turning from your back to your side while in a flat bed without using bedrails?: None Help needed moving from lying on your back to sitting on the side of a flat bed without using bedrails?: None Help needed moving to and from a bed to a chair (including a wheelchair)?: A Little Help needed standing up from a chair using your arms (e.g., wheelchair or bedside chair)?: A Little Help needed to walk in hospital room?: A Little Help needed climbing 3-5 steps with a railing? : A Lot 6 Click Score: 19    End of Session Equipment Utilized During Treatment: Gait belt Activity Tolerance: Patient limited by fatigue;Other (comment) Patient left: in bed;with call bell/phone within reach;with bed alarm set;with SCD's reapplied;with family/visitor present Nurse Communication: Mobility status;Precautions;Other (comment) PT Visit Diagnosis: Unsteadiness on feet (R26.81);Muscle weakness (generalized) (M62.81);Hemiplegia and hemiparesis;Adult, failure to thrive (R62.7)    Time: 1443-1540 PT Time Calculation (min) (ACUTE ONLY): 30 min   Charges:   PT Evaluation $PT Eval Low Complexity: 1 Low PT Treatments $Gait Training: 8-22 mins        Kreg Shropshire,  DPT 07/26/2022, 2:17 PM

## 2022-07-27 DIAGNOSIS — D62 Acute posthemorrhagic anemia: Secondary | ICD-10-CM | POA: Diagnosis not present

## 2022-07-27 DIAGNOSIS — K922 Gastrointestinal hemorrhage, unspecified: Secondary | ICD-10-CM | POA: Diagnosis not present

## 2022-07-27 DIAGNOSIS — E440 Moderate protein-calorie malnutrition: Secondary | ICD-10-CM | POA: Diagnosis not present

## 2022-07-27 LAB — BASIC METABOLIC PANEL
Anion gap: 5 (ref 5–15)
BUN: 8 mg/dL (ref 8–23)
CO2: 27 mmol/L (ref 22–32)
Calcium: 8.6 mg/dL — ABNORMAL LOW (ref 8.9–10.3)
Chloride: 108 mmol/L (ref 98–111)
Creatinine, Ser: 0.61 mg/dL (ref 0.44–1.00)
GFR, Estimated: >60 mL/min (ref >60)
Glucose, Bld: 91 mg/dL (ref 70–99)
Potassium: 3.9 mmol/L (ref 3.5–5.1)
Sodium: 140 mmol/L (ref 135–145)

## 2022-07-27 LAB — CBC
HCT: 29 % — ABNORMAL LOW (ref 36.0–46.0)
Hemoglobin: 9.3 g/dL — ABNORMAL LOW (ref 12.0–15.0)
MCH: 28.5 pg (ref 26.0–34.0)
MCHC: 32.1 g/dL (ref 30.0–36.0)
MCV: 89 fL (ref 80.0–100.0)
Platelets: 226 10*3/uL (ref 150–400)
RBC: 3.26 MIL/uL — ABNORMAL LOW (ref 3.87–5.11)
RDW: 14 % (ref 11.5–15.5)
WBC: 8.5 10*3/uL (ref 4.0–10.5)
nRBC: 0 % (ref 0.0–0.2)

## 2022-07-27 LAB — GLUCOSE, CAPILLARY
Glucose-Capillary: 108 mg/dL — ABNORMAL HIGH (ref 70–99)
Glucose-Capillary: 138 mg/dL — ABNORMAL HIGH (ref 70–99)
Glucose-Capillary: 150 mg/dL — ABNORMAL HIGH (ref 70–99)
Glucose-Capillary: 86 mg/dL (ref 70–99)
Glucose-Capillary: 90 mg/dL (ref 70–99)
Glucose-Capillary: 93 mg/dL (ref 70–99)

## 2022-07-27 NOTE — Plan of Care (Signed)

## 2022-07-27 NOTE — Progress Notes (Addendum)
PROGRESS NOTE    Catherine [name]  KVQ:[id number] DOB: 1940/12/26 DOA: 07/23/2022 PCP: Danelle Berry, NP  Assessment & Plan:   Principal Problem:   GI bleeding Active Problems:   Type 2 diabetes mellitus without complications (HCC)   Anxiety and depression   Hypothyroidism   Pressure injury of skin   Dyslipidemia   Coronary artery disease   Malnutrition of moderate degree  Assessment and Plan:   GI bleeding: secondary to a bleeding colonic angiodysplastic lesion as found on colonoscopy which was treated w/ argon beam. H&H are trending up today    Acute blood loss anemia: secondary to above. H&H are trending up today   DM2: well controlled, HbA1c 5.4. Continue on SSI w/ accuchecks     Depression: severity unknown. Continue on home dose of sertraline    Hx of CAD: continue on metoprolol, plavix, losartan, statin. Holding aspirin    HLD: continue on statin    Hypothyroidism: continue on levothyroxine   Moderate malnutrition: continue on nutritional supplements   Hypokalemia: WNL today   Urinary retention: foley placed on 07/24/22. D/c foley 07/27/22 and do a voiding trial today    DVT prophylaxis: SCDs Code Status: full  Family Communication: discussed pt's care w/ pt's husband, Jenny Reichmann, and answered his questiosns Disposition Plan:  medically stable to d/c to SNF but waiting on insurance auth still   Level of care: Progressive  Status is: Inpatient Remains inpatient appropriate because: medically stable to d/c back to SNF but waiting on insurance auth still    Consultants:  GI   Procedures:   Antimicrobials:    Subjective: Pt c/o fatigue   Objective: Vitals:   07/26/22 2345 07/27/22 0430 07/27/22 0739 07/27/22 1125  BP: (!) 138/54 (!) 133/44 (!) 156/46 (!) 122/50  Pulse: (!) 59 63 72 79  Resp: '17 16 19 19  '$ Temp: 98.1 F (36.7 C) 98.1 F (36.7 C) 98 F (36.7 C) 98 F (36.7 C)  TempSrc:      SpO2: 99% 98% 99% 98%  Weight:      Height:         Intake/Output Summary (Last 24 hours) at 07/27/2022 1447 Last data filed at 07/27/2022 1300 Gross per 24 hour  Intake 600 ml  Output 1000 ml  Net -400 ml   Filed Weights   07/23/22 1035 07/23/22 1651  Weight: 63.1 kg 63 kg    Examination:  General exam: Appears calm & comfortable  Respiratory system: clear breath sounds b/l. No rales, wheezes  Cardiovascular system: S1 & S2+. No rubs or gallops Gastrointestinal system: Abd is soft, NT, ND & normal bowel sounds  Central nervous system: alert and oriented. Moves all extremities  Psychiatry: Judgement and insight appear at baseline. Flat mood and affect    Data Reviewed: I have personally reviewed following labs and imaging studies  CBC: Recent Labs  Lab 07/23/22 1109 07/23/22 1718 07/23/22 2205 07/24/22 0519 07/25/22 2007 07/26/22 0435 07/27/22 0530  WBC 13.7*  --   --  7.6  --  7.4 8.5  NEUTROABS 10.4*  --   --   --   --   --   --   HGB 11.1*   < > 9.7* 9.2* 9.2* 8.4* 9.3*  HCT 36.0   < > 31.2* 29.5* 29.9* 26.8* 29.0*  MCV 91.8  --   --  91.6  --  91.5 89.0  PLT 346  --   --  242  --  218 226   < > =  values in this interval not displayed.   Basic Metabolic Panel: Recent Labs  Lab 07/23/22 1109 07/24/22 0519 07/26/22 0435 07/27/22 0530  NA 136 138 139 140  K 4.8 3.4* 3.1* 3.9  CL 105 112* 114* 108  CO2 '24 22 22 27  '$ GLUCOSE 98 78 82 91  BUN 15 9 7* 8  CREATININE 0.79 0.54 0.62 0.61  CALCIUM 9.2 8.0* 8.1* 8.6*   GFR: Estimated Creatinine Clearance: 46.5 mL/min (by C-G formula based on SCr of 0.61 mg/dL). Liver Function Tests: Recent Labs  Lab 07/23/22 1109  AST 26  ALT 15  ALKPHOS 62  BILITOT 1.0  PROT 6.6  ALBUMIN 3.6   Recent Labs  Lab 07/23/22 1109  LIPASE 39   No results for input(s): "AMMONIA" in the last 168 hours. Coagulation Profile: No results for input(s): "INR", "PROTIME" in the last 168 hours. Cardiac Enzymes: No results for input(s): "CKTOTAL", "CKMB", "CKMBINDEX",  "TROPONINI" in the last 168 hours. BNP (last 3 results) No results for input(s): "PROBNP" in the last 8760 hours. HbA1C: No results for input(s): "HGBA1C" in the last 72 hours. CBG: Recent Labs  Lab 07/26/22 2044 07/26/22 2345 07/27/22 0430 07/27/22 0739 07/27/22 1127  GLUCAP 130* 76 90 93 138*   Lipid Profile: No results for input(s): "CHOL", "HDL", "LDLCALC", "TRIG", "CHOLHDL", "LDLDIRECT" in the last 72 hours. Thyroid Function Tests: No results for input(s): "TSH", "T4TOTAL", "FREET4", "T3FREE", "THYROIDAB" in the last 72 hours. Anemia Panel: No results for input(s): "VITAMINB12", "FOLATE", "FERRITIN", "TIBC", "IRON", "RETICCTPCT" in the last 72 hours. Sepsis Labs: No results for input(s): "PROCALCITON", "LATICACIDVEN" in the last 168 hours.  Recent Results (from the past 240 hour(s))  MRSA Next Gen by PCR, Nasal     Status: Abnormal   Collection Time: 07/23/22  6:56 PM   Specimen: Nasal Mucosa; Nasal Swab  Result Value Ref Range Status   MRSA by PCR Next Gen DETECTED (A) NOT DETECTED Final    Comment: RESULT CALLED TO, READ BACK BY AND VERIFIED WITH: MARISSA GRAHAM 07/23/22 2048 MU (NOTE) The GeneXpert MRSA Assay (FDA approved for NASAL specimens only), is one component of a comprehensive MRSA colonization surveillance program. It is not intended to diagnose MRSA infection nor to guide or monitor treatment for MRSA infections. Test performance is not FDA approved in patients less than 56 years old. Performed at [facility], [address] [place], [place] [address]          Radiology Studies: No results found.      Scheduled Meds:  atorvastatin  80 mg Oral QPM   Chlorhexidine Gluconate Cloth  6 each Topical Q0600   clonazePAM  0.5 mg Oral QHS   clopidogrel  75 mg Oral Daily   feeding supplement  237 mL Oral BID BM   insulin aspart  0-9 Units Subcutaneous Q4H   isosorbide mononitrate  15 mg Oral Daily   levothyroxine  100 mcg Oral Q0600    losartan  12.5 mg Oral Daily   metoprolol tartrate  12.5 mg Oral BID   multivitamin with minerals  1 tablet Oral Daily   mupirocin ointment  1 Application Nasal BID   polyethylene glycol  17 g Oral Daily   sertraline  125 mg Oral Daily   traZODone  50 mg Oral QHS   Continuous Infusions:     LOS: 4 days    Time spent: 25 mins    Wyvonnia Dusky, MD [facility] Pager [phone]  If 7PM-7AM,  please contact night-coverage [email or web address] 07/27/2022, 2:47 PM

## 2022-07-27 NOTE — Progress Notes (Signed)
Occupational Therapy Treatment Patient Details Name: Catherine [name] MRN: [id number] DOB: October 09, 1941 Today's Date: 07/27/2022   History of present illness Catherine [name] is a 81 y.o. Caucasian female with medical history significant for coronary artery disease, type 2 diabetes mellitus, hypertension, dyslipidemia, hypothyroidism, IBS, rheumatoid arthritis, s/p TAVR and chronic diastolic CHF, and recent TIA for which she was placed on dual antiplatelet therapy with aspirin and Plavix and discharged on 07/16/2022.  Now present with rectal bleeding. She has been having maroon stools with chronic left-sided abdominal pain that is unchanged for the last month.  Colonoscopy 8/23 revealed bleed that was treated.   OT comments  Pt seen for OT tx this date. Pt agreeable to participate, spouse present and supportive. Pt completed bed mobility with supervision and increased time, stood requiring CGA with RW. Pt ambulated to bathroom with RW and CGA and completed transfer to [facility] over toilet with VC for hand placement. Pt completed pericare with supervision while seated. Pt then requested to sit up in the recliner for a little while. However, once seated in recliner, then promptly reported being ready to return to bed. CGA for transfer with RW and again VC For hand placement. Pt endorsed 7/10 lower abdominal pain and spouse endorsed concern of blood clots in foley tubing which OT also noted. RN notified of pt/spouse concerns. Pt progressing towards goals. Continues to benefit from skilled OT Services. Continue to rec SNF at this time.    Recommendations for follow up therapy are one component of a multi-disciplinary discharge planning process, led by the attending physician.  Recommendations may be updated based on patient status, additional functional criteria and insurance authorization.    Follow Up Recommendations  Skilled nursing-short term rehab (<3 hours/day)    Assistance Recommended at Discharge Frequent or  constant Supervision/Assistance  Patient can return home with the following  A little help with walking and/or transfers;A little help with bathing/dressing/bathroom;Direct supervision/assist for medications management;Assistance with cooking/housework;Assist for transportation   Equipment Recommendations  None recommended by OT    Recommendations for Other Services      Precautions / Restrictions Precautions Precautions: Fall Restrictions Weight Bearing Restrictions: No       Mobility Bed Mobility Overal bed mobility: Needs Assistance Bed Mobility: Supine to Sit, Sit to Supine     Supine to sit: Supervision Sit to supine: Supervision   General bed mobility comments: use of bed rail    Transfers Overall transfer level: Needs assistance Equipment used: Rolling walker (2 wheels) Transfers: Sit to/from Stand, Bed to chair/wheelchair/BSC Sit to Stand: Min guard     Step pivot transfers: Min guard     General transfer comment: VC for hand placement     Balance   Sitting-balance support: Feet supported Sitting balance-Leahy Scale: Good     Standing balance support: Bilateral upper extremity supported, During functional activity Standing balance-Leahy Scale: Fair                             ADL either performed or assessed with clinical judgement   ADL Overall ADL's : Needs assistance/impaired                     Lower Body Dressing: Maximal assistance Lower Body Dressing Details (indicate cue type and reason): unable to bend to reach feet 2/2 pain, max a for donning socks Toilet Transfer: Ambulation;BSC/3in1;Regular Toilet;Rolling walker (2 wheels);Min guard;Cueing for sequencing Toilet Transfer Details (indicate  cue type and reason): VC for hand placement, BSC over toilet in bathroom Toileting- Clothing Manipulation and Hygiene: Supervision/safety;Sitting/lateral lean              Extremity/Trunk Assessment              Vision        Perception     Praxis      Cognition Arousal/Alertness: Awake/alert Behavior During Therapy: WFL for tasks assessed/performed Overall Cognitive Status: Impaired/Different from baseline Area of Impairment: Problem solving, Following commands, Safety/judgement                       Following Commands: Follows one step commands with increased time Safety/Judgement: Decreased awareness of safety   Problem Solving: Slow processing, Difficulty sequencing, Requires verbal cues          Exercises      Shoulder Instructions       General Comments      Pertinent Vitals/ Pain       Pain Assessment Pain Assessment: 0-10 Pain Score: 7  Pain Location: lower abdomen/rectal area Pain Descriptors / Indicators: Grimacing, Discomfort Pain Intervention(s): Limited activity within patient's tolerance, Monitored during session, Repositioned, Patient requesting pain meds-RN notified  Home Living                                          Prior Functioning/Environment              Frequency  Min 2X/week        Progress Toward Goals  OT Goals(current goals can now be found in the care plan section)  Progress towards OT goals: Progressing toward goals  Acute Rehab OT Goals Patient Stated Goal: to be able to go home soon OT Goal Formulation: With patient Time For Goal Achievement: 08/08/22 Potential to Achieve Goals: Good  Plan Discharge plan remains appropriate;Frequency remains appropriate    Co-evaluation                 AM-PAC OT "6 Clicks" Daily Activity     Outcome Measure   Help from another person eating meals?: None Help from another person taking care of personal grooming?: A Little Help from another person toileting, which includes using toliet, bedpan, or urinal?: A Little Help from another person bathing (including washing, rinsing, drying)?: A Lot Help from another person to put on and taking off regular upper body  clothing?: None Help from another person to put on and taking off regular lower body clothing?: A Lot 6 Click Score: 18    End of Session Equipment Utilized During Treatment: Gait belt;Rolling walker (2 wheels)  OT Visit Diagnosis: Unsteadiness on feet (R26.81);Muscle weakness (generalized) (M62.81);Other symptoms and signs involving cognitive function   Activity Tolerance Patient tolerated treatment well   Patient Left in bed;with call bell/phone within reach;with bed alarm set;with family/visitor present   Nurse Communication Other (comment) (pain, blood clots in foley)        Time: 3810-1751 OT Time Calculation (min): 17 min  Charges: OT General Charges $OT Visit: 1 Visit OT Treatments $Self Care/Home Management : 8-22 mins  Ardeth Perfect., MPH, MS, OTR/L ascom [phone] 07/27/22, 3:21 PM

## 2022-07-27 NOTE — TOC Progression Note (Addendum)
Transition of Care [facility]) - Progression Note    Patient Details  Name: LIYLAH NAJARRO MRN: [id number] Date of Birth: 1941-06-30  Transition of Care [facility]) CM/SW [place], LCSW Phone Number: 07/27/2022, 9:06 AM  Clinical Narrative:  Josem Kaufmann still pending.   3:29 pm: Auth still pending.  4:19 pm: Auth still pending. Sent message through [facility] portal with weekend TOC contact information.  Expected Discharge Plan: [place] Barriers to Discharge: Continued Medical Work up  Expected Discharge Plan and Services Expected Discharge Plan: [place] Choice: [place] arrangements for the past 2 months: Single Family Home, [place]                                       Social Determinants of Health (SDOH) Interventions    Readmission Risk Interventions    07/25/2022   10:48 AM  Readmission Risk Prevention Plan  PCP or Specialist appointment within 3-5 days of discharge Complete  SW Recovery Care/Counseling Consult Complete  Palliative Care Screening Not [place] Complete

## 2022-07-27 NOTE — Progress Notes (Signed)
Mobility Specialist - Progress Note   07/27/22 1125  Mobility  Activity Transferred from bed to chair;Transferred from chair to bed  $Mobility charge 1 Mobility     Pt was assisted and set up in recliner as new bed linen was provided. Pt stated she couldn't tolerate sitting in recliner this date d/t pain. Pt returned to bed with B HHA. Alarm set, needs in reach.    Kathee Delton Mobility Specialist 07/27/22, 11:36 AM

## 2022-07-27 NOTE — Progress Notes (Signed)
MD notified of patient's blood tinged urine; with order to d/c foley (see other orders).

## 2022-07-27 NOTE — Progress Notes (Addendum)
Mobility Specialist - Progress Note   07/27/22 1100  Mobility  Activity Ambulated with assistance in room;Ambulated with assistance to bathroom  Level of Assistance Standby assist, set-up cues, supervision of patient - no hands on  Assistive Device Front wheel walker  Distance Ambulated (ft) 20 ft  Activity Response Tolerated well  $Mobility charge 1 Mobility     Pt lying in bed upon arrival, utilizing RA. Pt reports pain in lower abdomen, back, and headache but agreeable to activity. Pt sat EOB and STS with supervision. Does have incontinent stool while ambulating to bathroom. Pt left in bathroom with instruction to utilize call bell when finished, pt showed understanding.    Kathee Delton Mobility Specialist 07/27/22, 11:28 AM

## 2022-07-28 ENCOUNTER — Inpatient Hospital Stay: Payer: Medicare Other

## 2022-07-28 DIAGNOSIS — K5521 Angiodysplasia of colon with hemorrhage: Secondary | ICD-10-CM | POA: Diagnosis not present

## 2022-07-28 DIAGNOSIS — I442 Atrioventricular block, complete: Secondary | ICD-10-CM | POA: Diagnosis not present

## 2022-07-28 DIAGNOSIS — M6281 Muscle weakness (generalized): Secondary | ICD-10-CM | POA: Diagnosis not present

## 2022-07-28 DIAGNOSIS — M6259 Muscle wasting and atrophy, not elsewhere classified, multiple sites: Secondary | ICD-10-CM | POA: Diagnosis not present

## 2022-07-28 DIAGNOSIS — Z741 Need for assistance with personal care: Secondary | ICD-10-CM | POA: Diagnosis not present

## 2022-07-28 DIAGNOSIS — I10 Essential (primary) hypertension: Secondary | ICD-10-CM | POA: Diagnosis not present

## 2022-07-28 DIAGNOSIS — Z79899 Other long term (current) drug therapy: Secondary | ICD-10-CM | POA: Diagnosis not present

## 2022-07-28 DIAGNOSIS — W19XXXD Unspecified fall, subsequent encounter: Secondary | ICD-10-CM | POA: Diagnosis not present

## 2022-07-28 DIAGNOSIS — K922 Gastrointestinal hemorrhage, unspecified: Secondary | ICD-10-CM | POA: Diagnosis not present

## 2022-07-28 DIAGNOSIS — G9341 Metabolic encephalopathy: Secondary | ICD-10-CM | POA: Diagnosis not present

## 2022-07-28 DIAGNOSIS — N390 Urinary tract infection, site not specified: Secondary | ICD-10-CM | POA: Diagnosis not present

## 2022-07-28 DIAGNOSIS — E119 Type 2 diabetes mellitus without complications: Secondary | ICD-10-CM | POA: Diagnosis not present

## 2022-07-28 DIAGNOSIS — Z950 Presence of cardiac pacemaker: Secondary | ICD-10-CM | POA: Diagnosis not present

## 2022-07-28 DIAGNOSIS — I2510 Atherosclerotic heart disease of native coronary artery without angina pectoris: Secondary | ICD-10-CM | POA: Diagnosis not present

## 2022-07-28 DIAGNOSIS — E782 Mixed hyperlipidemia: Secondary | ICD-10-CM | POA: Diagnosis not present

## 2022-07-28 DIAGNOSIS — R488 Other symbolic dysfunctions: Secondary | ICD-10-CM | POA: Diagnosis not present

## 2022-07-28 DIAGNOSIS — R338 Other retention of urine: Secondary | ICD-10-CM | POA: Diagnosis not present

## 2022-07-28 DIAGNOSIS — Z743 Need for continuous supervision: Secondary | ICD-10-CM | POA: Diagnosis not present

## 2022-07-28 DIAGNOSIS — N182 Chronic kidney disease, stage 2 (mild): Secondary | ICD-10-CM | POA: Diagnosis not present

## 2022-07-28 DIAGNOSIS — R109 Unspecified abdominal pain: Secondary | ICD-10-CM | POA: Diagnosis not present

## 2022-07-28 DIAGNOSIS — E876 Hypokalemia: Secondary | ICD-10-CM | POA: Diagnosis not present

## 2022-07-28 DIAGNOSIS — F32A Depression, unspecified: Secondary | ICD-10-CM | POA: Diagnosis not present

## 2022-07-28 DIAGNOSIS — E569 Vitamin deficiency, unspecified: Secondary | ICD-10-CM | POA: Diagnosis not present

## 2022-07-28 DIAGNOSIS — E440 Moderate protein-calorie malnutrition: Secondary | ICD-10-CM | POA: Diagnosis not present

## 2022-07-28 DIAGNOSIS — Z8673 Personal history of transient ischemic attack (TIA), and cerebral infarction without residual deficits: Secondary | ICD-10-CM | POA: Diagnosis not present

## 2022-07-28 DIAGNOSIS — I951 Orthostatic hypotension: Secondary | ICD-10-CM | POA: Diagnosis not present

## 2022-07-28 DIAGNOSIS — G459 Transient cerebral ischemic attack, unspecified: Secondary | ICD-10-CM | POA: Diagnosis not present

## 2022-07-28 DIAGNOSIS — R1312 Dysphagia, oropharyngeal phase: Secondary | ICD-10-CM | POA: Diagnosis not present

## 2022-07-28 DIAGNOSIS — R531 Weakness: Secondary | ICD-10-CM | POA: Diagnosis not present

## 2022-07-28 DIAGNOSIS — R498 Other voice and resonance disorders: Secondary | ICD-10-CM | POA: Diagnosis not present

## 2022-07-28 DIAGNOSIS — I5032 Chronic diastolic (congestive) heart failure: Secondary | ICD-10-CM | POA: Diagnosis not present

## 2022-07-28 DIAGNOSIS — E039 Hypothyroidism, unspecified: Secondary | ICD-10-CM | POA: Diagnosis not present

## 2022-07-28 DIAGNOSIS — R339 Retention of urine, unspecified: Secondary | ICD-10-CM | POA: Diagnosis not present

## 2022-07-28 DIAGNOSIS — M069 Rheumatoid arthritis, unspecified: Secondary | ICD-10-CM | POA: Diagnosis not present

## 2022-07-28 DIAGNOSIS — R6889 Other general symptoms and signs: Secondary | ICD-10-CM | POA: Diagnosis not present

## 2022-07-28 LAB — GLUCOSE, CAPILLARY
Glucose-Capillary: 86 mg/dL (ref 70–99)
Glucose-Capillary: 95 mg/dL (ref 70–99)
Glucose-Capillary: 124 mg/dL — ABNORMAL HIGH (ref 70–99)
Glucose-Capillary: 145 mg/dL — ABNORMAL HIGH (ref 70–99)
Glucose-Capillary: 101 mg/dL — ABNORMAL HIGH (ref 70–99)

## 2022-07-28 LAB — BASIC METABOLIC PANEL
Anion gap: 6 (ref 5–15)
BUN: 9 mg/dL (ref 8–23)
CO2: 26 mmol/L (ref 22–32)
Calcium: 8.8 mg/dL — ABNORMAL LOW (ref 8.9–10.3)
Chloride: 107 mmol/L (ref 98–111)
Creatinine, Ser: 0.61 mg/dL (ref 0.44–1.00)
GFR, Estimated: >60 mL/min (ref >60)
Glucose, Bld: 90 mg/dL (ref 70–99)
Potassium: 3.8 mmol/L (ref 3.5–5.1)
Sodium: 139 mmol/L (ref 135–145)

## 2022-07-28 LAB — CBC
HCT: 31.8 % — ABNORMAL LOW (ref 36.0–46.0)
Hemoglobin: 9.9 g/dL — ABNORMAL LOW (ref 12.0–15.0)
MCH: 27.8 pg (ref 26.0–34.0)
MCHC: 31.1 g/dL (ref 30.0–36.0)
MCV: 89.3 fL (ref 80.0–100.0)
Platelets: 239 10*3/uL (ref 150–400)
RBC: 3.56 MIL/uL — ABNORMAL LOW (ref 3.87–5.11)
RDW: 14 % (ref 11.5–15.5)
WBC: 8.6 10*3/uL (ref 4.0–10.5)
nRBC: 0 % (ref 0.0–0.2)

## 2022-07-28 MED ORDER — ASPIRIN EC 81 MG PO TBEC: 81.0000 mg | DELAYED_RELEASE_TABLET | Freq: Every day | ORAL | 3 refills | Status: AC

## 2022-07-28 NOTE — TOC Progression Note (Signed)
Transition of Care [facility]) - Progression Note    Patient Details  Name: Catherine [name] MRN: [id number] Date of Birth: 1941-04-05  Transition of Care [facility]) CM/SW Contact  Izola Price, RN Phone Number: 07/28/2022, 11:15 AM  Clinical Narrative: 8/26: Insurance authorization received via [employer] [address] [address] #[id number]. Notified provider via secure chat. Spoke with spouse regarding private transport per PT notes to [employer] and insurance authorization approval. He had concerns he wished to speak to provider about, provider notified. He did say he could transport her back to facility. AC at [facility] updated on pending discharge orders. Simmie Davies RN CM    Expected Discharge Plan: Skilled Nursing Facility Barriers to Discharge: Continued Medical Work up  Expected Discharge Plan and Services Expected Discharge Plan: [place] Choice: [place] arrangements for the past 2 months: Single Family Home, [place]                                       Social Determinants of Health (SDOH) Interventions    Readmission Risk Interventions    07/25/2022   10:48 AM  Readmission Risk Prevention Plan  PCP or Specialist appointment within 3-5 days of discharge Complete  SW Recovery Care/Counseling Consult Complete  Palliative Care Screening Not [place] Complete

## 2022-07-28 NOTE — Progress Notes (Signed)
Report given to [facility] ([phone]).

## 2022-07-28 NOTE — Discharge Instructions (Signed)
Pt discharge. Staff made aware pt has foley catheter

## 2022-07-28 NOTE — Progress Notes (Signed)
Physical Therapy Treatment Patient Details Name: Catherine [name] MRN: [id number] DOB: [birth year] Today's Date: 07/28/2022   History of Present Illness Catherine [name] is a 81 y.o. Caucasian female with medical history significant for coronary artery disease, type 2 diabetes mellitus, hypertension, dyslipidemia, hypothyroidism, IBS, rheumatoid arthritis, s/p TAVR and chronic diastolic CHF, and recent TIA for which she was placed on dual antiplatelet therapy with aspirin and Plavix and discharged on 07/16/2022.  Now present with rectal bleeding. She has been having maroon stools with chronic left-sided abdominal pain that is unchanged for the last month.  Colonoscopy 8/23 revealed bleed that was treated.    PT Comments    Patient alert, agreeable to PT. Reported some L leg pain, did not quantify. She was able to perform supine to sit modI, BP taken in sitting (112/58) due to pt complaints of dizziness. She was able to step pivot to recliner with CGA and RW. With rest breaks, she also ambulated twice ~49f. Pt reported being very fatigued after activity and SOB, spO2 98% on room air. The patient would benefit from further skilled PT intervention to continue to progress towards goals. Recommendation remains appropriate.       Recommendations for follow up therapy are one component of a multi-disciplinary discharge planning process, led by the attending physician.  Recommendations may be updated based on patient status, additional functional criteria and insurance authorization.  Follow Up Recommendations  Skilled nursing-short term rehab (<3 hours/day) Can patient physically be transported by private vehicle: Yes   Assistance Recommended at Discharge Frequent or constant Supervision/Assistance  Patient can return home with the following A little help with walking and/or transfers;A little help with bathing/dressing/bathroom;Assistance with cooking/housework;Direct supervision/assist for medications  management;Direct supervision/assist for financial management;Assist for transportation;Help with stairs or ramp for entrance   Equipment Recommendations  None recommended by PT    Recommendations for Other Services       Precautions / Restrictions Precautions Precautions: Fall Restrictions Weight Bearing Restrictions: No     Mobility  Bed Mobility Overal bed mobility: Needs Assistance Bed Mobility: Supine to Sit     Supine to sit: Supervision          Transfers Overall transfer level: Needs assistance Equipment used: Rolling walker (2 wheels) Transfers: Sit to/from Stand Sit to Stand: Min guard   Step pivot transfers: Min guard       General transfer comment: VC for hand placement    Ambulation/Gait Ambulation/Gait assistance: Min guard Gait Distance (Feet):  (366f twice) Assistive device: Rolling walker (2 wheels) Gait Pattern/deviations: Step-through pattern, Decreased stride length       General Gait Details: able to statically stand with CGA   Stairs             Wheelchair Mobility    Modified Rankin (Stroke Patients Only)       Balance Overall balance assessment: Needs assistance Sitting-balance support: Feet supported Sitting balance-Leahy Scale: Good     Standing balance support: Bilateral upper extremity supported, During functional activity Standing balance-Leahy Scale: Fair                              Cognition Arousal/Alertness: Awake/alert Behavior During Therapy: WFL for tasks assessed/performed                                   General Comments: Generally WFL, follows  all commands        Exercises      General Comments        Pertinent Vitals/Pain Pain Assessment Pain Assessment: Faces Faces Pain Scale: Hurts a little bit Pain Location: lower abdomen/rectal area Pain Descriptors / Indicators: Grimacing, Discomfort Pain Intervention(s): Limited activity within patient's  tolerance, Monitored during session, Repositioned, Patient requesting pain meds-RN notified    Home Living                          Prior Function            PT Goals (current goals can now be found in the care plan section) Progress towards PT goals: Progressing toward goals    Frequency    Min 2X/week      PT Plan Current plan remains appropriate    Co-evaluation              AM-PAC PT "6 Clicks" Mobility   Outcome Measure  Help needed turning from your back to your side while in a flat bed without using bedrails?: None Help needed moving from lying on your back to sitting on the side of a flat bed without using bedrails?: None Help needed moving to and from a bed to a chair (including a wheelchair)?: A Little Help needed standing up from a chair using your arms (e.g., wheelchair or bedside chair)?: A Little Help needed to walk in hospital room?: A Little Help needed climbing 3-5 steps with a railing? : A Lot 6 Click Score: 19    End of Session Equipment Utilized During Treatment: Gait belt Activity Tolerance: Patient limited by fatigue Patient left: with call bell/phone within reach;with family/visitor present;with chair alarm set;in chair Nurse Communication: Mobility status PT Visit Diagnosis: Unsteadiness on feet (R26.81);Muscle weakness (generalized) (M62.81);Hemiplegia and hemiparesis;Adult, failure to thrive (R62.7)     Time: 5361-4431 PT Time Calculation (min) (ACUTE ONLY): 18 min  Charges:  $Therapeutic Activity: 8-22 mins                     Lieutenant Diego PT, DPT 12:22 PM,07/28/22

## 2022-07-28 NOTE — Progress Notes (Signed)
Pt requested to have lunch before leaving; also husband requested to have patient be transported by ambulance.   Called [facility] x 3 for report, no answer.

## 2022-07-28 NOTE — Discharge Summary (Signed)
Physician Discharge Summary  LUPE HANDLEY PYK:[id number] DOB: 24-Aug-1941 DOA: 07/23/2022  PCP: Danelle Berry, NP  Admit date: 07/23/2022 Discharge date: 07/28/2022  Admitted From: SNF Disposition:  [facility]  Recommendations for Outpatient Follow-up:  Follow up with PCP in 1-2 weeks F/u w/ GI, Dr. Haig Prophet, in 2-3 weeks  Home Health: no Equipment/Devices: foley: placed on 07/28/22 and can do a voiding trial at [facility] in 1 week   Discharge Condition: stable  CODE STATUS:Full  Diet recommendation: Heart Healthy / Carb Modified  Brief/Interim Summary: HPI was taken from Dr. Sidney Ace: coronary artery disease, type 2 diabetes mellitus, hypertension, dyslipidemia, hypothyroidism, IBS, rheumatoid arthritis, s/p TAVR and chronic diastolic CHF, and recent TIA for which she was placed on dual antiplatelet therapy with aspirin and Plavix and discharged on 07/16/2022, who presented to the emergency room with acute onset of rectal bleeding.  She was straining today when she was having a bowel movement when this happened.  She has been having maroon stools with chronic left-sided abdominal pain that is unchanged for the last month.  She admits to nausea without vomiting or heartburn.  No fever or chills.  No current chest pain or palpitations.  No dyspnea or cough or wheezing or hemoptysis.  She has mild dysuria without oliguria or hematuria or flank pain.  No other bleeding diathesis.   ED Course: When she came to the ER, respiratory it was 29 with otherwise normal vital signs.  Labs revealed leukocytosis of 13.7 with neutrophilia and mild anemia with hemoglobin 11.1 and hematocrit 36 compared to 12.3/37.5 on 8/13.  Urinalysis Kmak negative.  CMP was within normal. EKG as reviewed by me : EKG showed sinus rhythm with a rate of 86 with LVH with IVCD, LAD and secondary repolarization with inferior Q waves and T wave inversion laterally with prolonged QT interval with QTc of 515 MS. Imaging: Abdominal pelvic CT  scan revealed no acute intra abdominal or pelvic abnormalities.  It showed diverticulosis without diverticulitis and aortic atherosclerosis.   The patient was given 2 mg of IV morphine sulfate, 4 mg of IV Zofran and 650 mg p.o. Tylenol.  She will be admitted to a progressive unit bed for further evaluation and management.  As per Dr. Kurtis Bushman: LORIE CLECKLEY is a 81 y.o. Caucasian female with medical history significant for coronary artery disease, type 2 diabetes mellitus, hypertension, dyslipidemia, hypothyroidism, IBS, rheumatoid arthritis, s/p TAVR and chronic diastolic CHF, and recent TIA for which she was placed on dual antiplatelet therapy with aspirin and Plavix and discharged on 07/16/2022, who presented to the emergency room with acute onset of rectal bleeding   Abdominal pelvic CT scan revealed no acute intra abdominal or pelvic abnormalities.  It showed diverticulosis without diverticulitis and aortic atherosclerosis   Gi consulted, w/u pending   As per Dr. Jimmye Norman 8/23-8/26/23: Pt had a colonoscopy which showed a bleeding colonic angiodysplastic lesion that was treated w/ argon beam. One 54m polyp in the descending colon and resection was not attempted. Diverticulosis in the sigmoid colon. Pt will need to f/u outpatient w/ GI, Dr. LHaig Prophet in 2-3 weeks. Pt's H&H have been trending up daily x 48 hrs. Pt may resume home dose of plavix. Also, pt was found to have urinary retention and a foley was initially placed on 07/24/22 and then d/c on 07/27/22. Pt was able to urinate after foley was removed but pt still had high post void residuals so a repeat foley was placed on 07/28/22. Repeat voiding trial  in 1 week at [facility]. For more information, please see previous progress/consult notes.    Discharge Diagnoses:  Principal Problem:   GI bleeding Active Problems:   Type 2 diabetes mellitus without complications (HCC)   Anxiety and depression   Hypothyroidism   Pressure injury of skin    Dyslipidemia   Coronary artery disease   Malnutrition of moderate degree  GI bleeding: secondary to a bleeding colonic angiodysplastic lesion as found on colonoscopy which was treated w/ argon beam. H&H are trending up today    Acute blood loss anemia: secondary to above. H&H are trending up today   DM2: well controlled, HbA1c 5.4. Diet controlled      Depression: severity unknown. Continue on home dose of sertraline    Hx of CAD: continue on metoprolol, plavix, losartan, statin. Holding aspirin    HLD: continue on statin    Hypothyroidism: continue on levothyroxine   Moderate malnutrition: continue on nutritional supplements   Hypokalemia: WNL today   Urinary retention: foley placed on 07/24/22. D/c foley 07/27/22. Foley placed again on 07/28/22 for high post void residuals. Repeat voiding trial in 1 week at [facility]   Discharge Instructions  Discharge Instructions     Diet - low sodium heart healthy   Complete by: As directed    Diet Carb Modified   Complete by: As directed    Discharge instructions   Complete by: As directed    F/u w/ PCP in 1-2 weeks. F/u w/ GI, Dr. Haig Prophet, in 2-3 weeks   Increase activity slowly   Complete by: As directed    No wound care   Complete by: As directed       Allergies as of 07/28/2022       Reactions   Tetracyclines & Related Anaphylaxis, Rash   Sulfa Antibiotics Hives   Latex Rash        Medication List     TAKE these medications    acetaminophen 325 MG tablet Commonly known as: Tylenol Take 2 tablets (650 mg total) by mouth every 4 (four) hours as needed.   aspirin EC 81 MG tablet Take 1 tablet (81 mg total) by mouth daily. Swallow whole. Hold this medication until you see your PCP and/or cardiologist What changed: additional instructions   atorvastatin 80 MG tablet Commonly known as: LIPITOR Take 1 tablet (80 mg total) by mouth daily.   clonazePAM 0.5 MG tablet Commonly known as: KLONOPIN Take 1 tablet (0.5 mg  total) by mouth at bedtime. What changed: Another medication with the same name was removed. Continue taking this medication, and follow the directions you see here.   clopidogrel 75 MG tablet Commonly known as: PLAVIX Take 1 tablet (75 mg total) by mouth daily.   famotidine 40 MG tablet Commonly known as: PEPCID Take 40 mg by mouth daily.   isosorbide mononitrate 30 MG 24 hr tablet Commonly known as: IMDUR TAKE 1/2 OF A TABLET (15 MG TOTAL) BY MOUTH DAILY What changed: See the new instructions.   levothyroxine 100 MCG tablet Commonly known as: SYNTHROID Take 1 tablet (100 mcg total) by mouth daily at 6 (six) AM.   losartan 25 MG tablet Commonly known as: COZAAR Take 1/2 tablet (12.5 mg total) by mouth daily. What changed: Another medication with the same name was removed. Continue taking this medication, and follow the directions you see here.   metoprolol tartrate 25 MG tablet Commonly known as: LOPRESSOR Take 1/2 tablet (12.5 mg total) by mouth 2 (two)  times daily.   multivitamin with minerals Tabs tablet Take 1 tablet by mouth daily.   nitroGLYCERIN 0.4 MG SL tablet Commonly known as: NITROSTAT Place 1 tablet (0.4 mg total) under the tongue every 5 (five) minutes as needed for chest pain.   polyethylene glycol powder 17 GM/SCOOP powder Commonly known as: GLYCOLAX/MIRALAX Take 17 g by mouth daily.   potassium chloride 10 MEQ tablet Commonly known as: KLOR-CON TAKE 1 TABLET BY MOUTH EVERY OTHER DAY   sertraline 50 MG tablet Commonly known as: ZOLOFT Take 2.5 tablets (125 mg total) by mouth daily.   traZODone 50 MG tablet Commonly known as: DESYREL Take 50 mg by mouth at bedtime.   Zinc Oxide 12.8 % ointment Commonly known as: TRIPLE PASTE Apply topically 4 (four) times daily.        Allergies  Allergen Reactions   Tetracyclines & Related Anaphylaxis and Rash   Sulfa Antibiotics Hives   Latex Rash    Consultations: GI    Procedures/Studies: DG  Abd Portable 1V  Result Date: 07/28/2022 CLINICAL DATA:  81 year old female with abdominal pain and distension. EXAM: PORTABLE ABDOMEN - 1 VIEW COMPARISON:  07/23/2022 and prior studies FINDINGS: The bowel gas pattern is unremarkable. There is no evidence of bowel obstruction. Gas and small amount of stool within nondistended colon and rectum noted. Vascular calcifications are present. No acute bony abnormalities are noted. Degenerative changes in the lumbar spine and hips again identified. Cardiac valve replacement and pacemaker leads are again noted in the LOWER chest. IMPRESSION: No acute abnormality.  Unremarkable bowel gas pattern. Electronically Signed   By: Margarette Canada M.D.   On: 07/28/2022 10:15   CT Abdomen Pelvis W Contrast  Result Date: 07/23/2022 CLINICAL DATA:  Left lower quadrant abdominal pain EXAM: CT ABDOMEN AND PELVIS WITH CONTRAST TECHNIQUE: Multidetector CT imaging of the abdomen and pelvis was performed using the standard protocol following bolus administration of intravenous contrast. RADIATION DOSE REDUCTION: This exam was performed according to the departmental dose-optimization program which includes automated exposure control, adjustment of the mA and/or kV according to patient size and/or use of iterative reconstruction technique. CONTRAST:  184m OMNIPAQUE IOHEXOL 300 MG/ML  SOLN COMPARISON:  CT abdomen and pelvis dated June 11, 2022 FINDINGS: Lower chest: Prior transcatheter aortic valve replacement. Partially visualized pacer wires in the right atrium and right ventricle. Bibasilar atelectasis. Hepatobiliary: No focal liver abnormality is seen. No gallstones, gallbladder wall thickening, or biliary dilatation. Pancreas: Unremarkable. No pancreatic ductal dilatation or surrounding inflammatory changes. Spleen: Normal in size without focal abnormality. Adrenals/Urinary Tract: Bilateral adrenal glands are unremarkable. No hydronephrosis or nephrolithiasis. Stable hyperdense exophytic  lesion of the lower pole of the right kidney. Bladder is unremarkable. Stomach/Bowel: Stomach is within normal limits. Diverticulosis. No evidence of bowel wall thickening, distention, or inflammatory changes. Vascular/Lymphatic: Aortic atherosclerosis. No enlarged abdominal or pelvic lymph nodes. Reproductive: No adnexal masses. Other: No abdominal wall hernia or abnormality. No abdominopelvic ascites. Musculoskeletal: No acute or significant osseous findings. IMPRESSION: 1. No acute findings in the abdomen or pelvis. 2. Diverticulosis with no evidence of diverticulitis. 3. Aortic Atherosclerosis (ICD10-I70.0). Electronically Signed   By: LYetta GlassmanM.D.   On: 07/23/2022 13:37   DG CHEST PORT 1 VIEW  Result Date: 07/11/2022 CLINICAL DATA:  Altered mental status. EXAM: PORTABLE CHEST 1 VIEW COMPARISON:  June 11, 2022. FINDINGS: The heart size and mediastinal contours are within normal limits. Both lungs are clear. Right-sided pacemaker is unchanged in position. The visualized skeletal structures  are unremarkable. IMPRESSION: No active disease. Electronically Signed   By: Marijo Conception M.D.   On: 07/11/2022 11:13   EEG adult  Result Date: 07/11/2022 Lora Havens, MD     07/11/2022  8:48 AM Patient Name: ADIA CRAMMER MRN: [id number] Epilepsy Attending: Lora Havens Referring Physician/Provider: Linus Galas, MD Date: 07/11/2022 Duration: 22.38 mins Patient history: 81 year old female with altered mental status.  EEG to evaluate for seizure. Level of alertness: Awake AEDs during EEG study: None Technical aspects: This EEG study was done with scalp electrodes positioned according to the 10-20 International system of electrode placement. Electrical activity was reviewed with band pass filter of 1-'70Hz'$ , sensitivity of 7 uV/mm, display speed of 69m/sec with a '60Hz'$  notched filter applied as appropriate. EEG data were recorded continuously and digitally stored.  Video monitoring was available  and reviewed as appropriate. Description: The posterior dominant rhythm consists of 9 Hz activity of moderate voltage (25-35 uV) seen predominantly in posterior head regions, symmetric and reactive to eye opening and eye closing. Hyperventilation and photic stimulation were not performed.   IMPRESSION: This study is within normal limits. No seizures or epileptiform discharges were seen throughout the recording. A normal interictal EEG does not exclude nor support the diagnosis of epilepsy. PLora Havens  CT HEAD CODE STROKE WO CONTRAST  Result Date: 07/10/2022 CLINICAL DATA:  Code stroke. Initial evaluation for neuro deficit, stroke suspected. EXAM: CT HEAD WITHOUT CONTRAST TECHNIQUE: Contiguous axial images were obtained from the base of the skull through the vertex without intravenous contrast. RADIATION DOSE REDUCTION: This exam was performed according to the departmental dose-optimization program which includes automated exposure control, adjustment of the mA and/or kV according to patient size and/or use of iterative reconstruction technique. COMPARISON:  CT from 07/09/2022. FINDINGS: Brain: Generalized age-related cerebral atrophy. No acute intracranial hemorrhage. No acute large vessel territory infarct. No mass lesion or midline shift. No hydrocephalus or extra-axial fluid collection. Vascular: No abnormal hyperdense vessel. Skull: Scalp soft tissues and calvarium demonstrate no acute finding. Sinuses/Orbits: Globes orbital soft tissues within normal limits. Paranasal sinuses and mastoid air cells are clear. Other: None. ASPECTS ([facility] Early CT Score) - Ganglionic level infarction (caudate, lentiform nuclei, internal capsule, insula, M1-M3 cortex): 7 - Supraganglionic infarction (M4-M6 cortex): 3 Total score (0-10 with 10 being normal): 10 IMPRESSION: 1. No acute intracranial abnormality. 2. ASPECTS is 10. These results were communicated to Dr. ARory Percyat 8:35 pm on 07/10/2022 by text  page via the [facility] Cent[facility]essaging system. Electronically Signed   By: BJeannine BogaM.D.   On: 07/10/2022 20:35   VAS [place]OWER EXTREMITY VENOUS (DVT)  Result Date: 07/09/2022  Lower Venous DVT Study Patient Name:  BKOREN PLYLER Date of Exam:   07/09/2022 Medical Rec #: [id number]      Accession #:    [id number] of Birth: 8Aug 30, 1942      Patient Gender: F Patient Age:   89years [facility]      VAS [place]OWER EXTREMITY VENOUS (DVT) Referring Phys: JCornelius MorasXU --------------------------------------------------------------------------------  Indications: Stroke.  Comparison Study: no prior Performing Technologist: MArchie PattenRVS  Examination Guidelines: A complete evaluation includes B-mode imaging, spectral Doppler, color Doppler, and power Doppler as needed of all accessible portions of each vessel. Bilateral testing is considered an integral part of a complete examination. Limited examinations for reoccurring indications may be performed as noted. The reflux portion of the  exam is performed with the patient in reverse Trendelenburg.  +---------+---------------+---------+-----------+----------+--------------+ RIGHT    CompressibilityPhasicitySpontaneityPropertiesThrombus Aging +---------+---------------+---------+-----------+----------+--------------+ CFV      Full           Yes      Yes                                 +---------+---------------+---------+-----------+----------+--------------+ SFJ      Full                                                        +---------+---------------+---------+-----------+----------+--------------+ FV Prox  Full                                                        +---------+---------------+---------+-----------+----------+--------------+ FV Mid   Full                                                        +---------+---------------+---------+-----------+----------+--------------+ FV DistalFull                                                         +---------+---------------+---------+-----------+----------+--------------+ PFV      Full                                                        +---------+---------------+---------+-----------+----------+--------------+ POP      Full           Yes      Yes                                 +---------+---------------+---------+-----------+----------+--------------+ PTV      Full                                                        +---------+---------------+---------+-----------+----------+--------------+ PERO     Full                                                        +---------+---------------+---------+-----------+----------+--------------+   +---------+---------------+---------+-----------+----------+--------------+ LEFT     CompressibilityPhasicitySpontaneityPropertiesThrombus Aging +---------+---------------+---------+-----------+----------+--------------+ CFV      Full           Yes      Yes                                 +---------+---------------+---------+-----------+----------+--------------+  SFJ      Full                                                        +---------+---------------+---------+-----------+----------+--------------+ FV Prox  Full                                                        +---------+---------------+---------+-----------+----------+--------------+ FV Mid   Full                                                        +---------+---------------+---------+-----------+----------+--------------+ FV DistalFull                                                        +---------+---------------+---------+-----------+----------+--------------+ PFV      Full                                                        +---------+---------------+---------+-----------+----------+--------------+ POP      Full           Yes      Yes                                  +---------+---------------+---------+-----------+----------+--------------+ PTV      Full                                                        +---------+---------------+---------+-----------+----------+--------------+ PERO     Full                                                        +---------+---------------+---------+-----------+----------+--------------+     Summary: BILATERAL: - No evidence of deep vein thrombosis seen in the lower extremities, bilaterally. -No evidence of popliteal cyst, bilaterally.   *See table(s) above for measurements and observations. Electronically signed by Servando Snare MD on 07/09/2022 at 1:35:33 PM.    Final    CT HEAD WO CONTRAST (5MM)  Result Date: 07/09/2022 CLINICAL DATA:  Provided history: Stroke, follow-up. EXAM: CT HEAD WITHOUT CONTRAST TECHNIQUE: Contiguous axial images were obtained from the base of the skull through the vertex without intravenous contrast. RADIATION DOSE REDUCTION: This exam was performed according to the departmental dose-optimization program which includes automated exposure control, adjustment of the  mA and/or kV according to patient size and/or use of iterative reconstruction technique. COMPARISON:  Non-contrast head CT and CT angiogram head/neck 07/08/2022. Brain MRI 08/15/2015. FINDINGS: Brain: Moderate generalized cerebral atrophy. Mild patchy and ill-defined hypoattenuation within the cerebral white matter, nonspecific but compatible with chronic small vessel ischemic disease. There is no acute intracranial hemorrhage. No demarcated cortical infarct. No extra-axial fluid collection. No evidence of an intracranial mass. No midline shift. Vascular: No hyperdense vessel. Atherosclerotic calcifications. Skull: No fracture or aggressive osseous lesion. Sinuses/Orbits: No mass or acute finding within the imaged orbits. Opacification of a posterior right ethmoid air cell. IMPRESSION: No evidence of acute intracranial  abnormality. Mild chronic small vessel ischemic changes within the cerebral white matter. Moderate generalized cerebral atrophy. Right ethmoid sinusitis. Electronically Signed   By: Kellie Simmering D.O.   On: 07/09/2022 10:20   DG Cervical Spine Complete  Result Date: 07/08/2022 CLINICAL DATA:  [id number], pain EXAM: CERVICAL SPINE - COMPLETE 4+ VIEW COMPARISON:  Same day CT neck FINDINGS: The cervical spine is visualized from C1-C6. Minimal retrolisthesis C5-6. Vertebral body heights are maintained: no evidence of acute fracture. Mild to moderate intervertebral disc space height loss at C5-6. Severe multilevel facet arthropathy and uncovertebral hypertrophy results in multilevel osseous neuroforaminal narrowing most pronounced in the mid cervical spine. No prevertebral soft tissue swelling. Atherosclerotic calcifications. IMPRESSION: Multilevel degenerative changes. Electronically Signed   By: Valentino Saxon M.D.   On: 07/08/2022 13:25   CT ANGIO HEAD NECK W WO CM W PERF (CODE STROKE)  Result Date: 07/08/2022 CLINICAL DATA:  Code stroke with deficit not provided. EXAM: CT ANGIOGRAPHY HEAD AND NECK CT PERFUSION BRAIN TECHNIQUE: Multidetector CT imaging of the head and neck was performed using the standard protocol during bolus administration of intravenous contrast. Multiplanar CT image reconstructions and MIPs were obtained to evaluate the vascular anatomy. Carotid stenosis measurements (when applicable) are obtained utilizing NASCET criteria, using the distal internal carotid diameter as the denominator. Multiphase CT imaging of the brain was performed following IV bolus contrast injection. Subsequent parametric perfusion maps were calculated using RAPID software. RADIATION DOSE REDUCTION: This exam was performed according to the departmental dose-optimization program which includes automated exposure control, adjustment of the mA and/or kV according to patient size and/or use of iterative reconstruction  technique. CONTRAST:  64m OMNIPAQUE IOHEXOL 350 MG/ML SOLN COMPARISON:  Head CT from earlier today FINDINGS: CTA NECK FINDINGS Aortic arch: Limited coverage shows atheromatous plaque. Great vessel origins are not seen. Right carotid system: Atheromatous plaque at the bifurcation and proximal ICA with proximal ICA stenosis at least measuring 65%. No ulceration or beading. Left carotid system: Fairly bulky calcified plaque at the bifurcation. Proximal ICA stenosis measures 40%. No ulceration or beading Vertebral arteries: Atherosclerosis of the subclavian arteries without visible flow reducing stenosis. The vertebral arteries are widely patent to the dura. Skeleton: Bulky degenerative endplate and facet spurring. Other neck: Left scalp hematoma without acute fracture. Upper chest: Negative Review of the MIP images confirms the above findings CTA HEAD FINDINGS Anterior circulation: Suboptimal small-vessel detail due to bolus quality. Confluent atheromatous plaque on the carotid siphons. Right ICA narrowing measures up to 50% the paraclinoid segment, measurement limited by calcified plaque blooming. Early branching right MCA with moderate stenosis of the lower branch. Branch vessel atherosclerosis and vessel irregularity affecting both MCAs. Posterior circulation: Proximal basilar fenestration. The vertebral and basilar arteries are widely patent. No evidence of cerebellar branch occlusion. Atheromatous irregularity of the posterior cerebral arteries with especially advanced  right P3 segment stenosis. Moderate to advanced left P1 2 junction stenosis. Venous sinuses: Diffusely patent Anatomic variants: As above Review of the MIP images confirms the above findings CT Brain Perfusion Findings: CBF (<30%) Volume: 71m Perfusion (Tmax>6.0s) volume: 099mIMPRESSION: 1. No emergent finding by CTA and CTP. 2. Atherosclerosis in the neck causes proximal ICA stenosis of 65% on the right and 40% on the left. 3. Extensive  intracranial atherosclerosis most notably causing bilateral high-grade PCA stenosis and moderate right M1 stenosis. 4. Great vessel origins are not covered. Electronically Signed   By: JoJorje Guild.D.   On: 07/08/2022 10:02   CT HEAD CODE STROKE WO CONTRAST  Result Date: 07/08/2022 CLINICAL DATA:  Code stroke. EXAM: CT HEAD WITHOUT CONTRAST TECHNIQUE: Contiguous axial images were obtained from the base of the skull through the vertex without intravenous contrast. RADIATION DOSE REDUCTION: This exam was performed according to the departmental dose-optimization program which includes automated exposure control, adjustment of the mA and/or kV according to patient size and/or use of iterative reconstruction technique. COMPARISON:  06/11/2022 FINDINGS: Brain: No evidence of acute infarction, hemorrhage, hydrocephalus, extra-axial collection or mass lesion/mass effect. Age normal appearance of the brain Vascular: No hyperdense vessel or unexpected calcification. Skull: High left scalp swelling. Sinuses/Orbits: No acute finding. Other: These results were communicated to Dr. StQuinn Axet 9:26 am on 07/08/2022 by text page via the [facility] system. ASPECTS ([facility]) Not scored without localizing symptoms IMPRESSION: 1. Negative for hemorrhage or visible infarct. 2. Left scalp swelling. Electronically Signed   By: JoJorje Guild.D.   On: 07/08/2022 09:29   (Echo, Carotid, EGD, Colonoscopy, ERCP)    Subjective: Pt c/o fatigue    Discharge Exam: Vitals:   07/28/22 0804 07/28/22 1059  BP: (!) 145/58 (!) 105/40  Pulse: 71 62  Resp: 18 16  Temp: 98 F (36.7 C) 98.5 F (36.9 C)  SpO2: 98% 98%   Vitals:   07/27/22 2334 07/28/22 0406 07/28/22 0804 07/28/22 1059  BP: (!) 122/45 (!) 146/56 (!) 145/58 (!) 105/40  Pulse: 61 61 71 62  Resp: '16 18 18 16  '$ Temp: 97.7 F (36.5 C) 98.1 F (36.7 C) 98 F (36.7 C) 98.5 F (36.9 C)  TempSrc: Oral     SpO2: 99% 100% 98% 98%   Weight:      Height:        General: Pt is alert, awake, not in acute distress Cardiovascular: S1/S2 +, no rubs, no gallops Respiratory: CTA bilaterally, no wheezing, no rhonchi Abdominal: Soft, NT, ND, bowel sounds + Extremities: no edema, no cyanosis    The results of significant diagnostics from this hospitalization (including imaging, microbiology, ancillary and laboratory) are listed below for reference.     Microbiology: Recent Results (from the past 240 hour(s))  MRSA Next Gen by PCR, Nasal     Status: Abnormal   Collection Time: 07/23/22  6:56 PM   Specimen: Nasal Mucosa; Nasal Swab  Result Value Ref Range Status   MRSA by PCR Next Gen DETECTED (A) NOT DETECTED Final    Comment: RESULT CALLED TO, READ BACK BY AND VERIFIED WITH: MARISSA GRAHAM 07/23/22 2048 MU (NOTE) The GeneXpert MRSA Assay (FDA approved for NASAL specimens only), is one component of a comprehensive MRSA colonization surveillance program. It is not intended to diagnose MRSA infection nor to guide or monitor treatment for MRSA infections. Test performance is not FDA approved in patients less than 2 67ears old. Performed  at [facility], [address] [place], [place] [address]      Labs: BNP (last 3 results) Recent Labs    03/07/22 1810 06/11/22 1235  BNP 47.0 240.9*   Basic Metabolic Panel: Recent Labs  Lab 07/23/22 1109 07/24/22 0519 07/26/22 0435 07/27/22 0530 07/28/22 0420  NA 136 138 139 140 139  K 4.8 3.4* 3.1* 3.9 3.8  CL 105 112* 114* 108 107  CO2 '24 22 22 27 26  '$ GLUCOSE 98 78 82 91 90  BUN 15 9 7* 8 9  CREATININE 0.79 0.54 0.62 0.61 0.61  CALCIUM 9.2 8.0* 8.1* 8.6* 8.8*   Liver Function Tests: Recent Labs  Lab 07/23/22 1109  AST 26  ALT 15  ALKPHOS 62  BILITOT 1.0  PROT 6.6  ALBUMIN 3.6   Recent Labs  Lab 07/23/22 1109  LIPASE 39   No results for input(s): "AMMONIA" in the last 168 hours. CBC: Recent Labs  Lab 07/23/22 1109 07/23/22 1718  07/24/22 0519 07/25/22 2007 07/26/22 0435 07/27/22 0530 07/28/22 0420  WBC 13.7*  --  7.6  --  7.4 8.5 8.6  NEUTROABS 10.4*  --   --   --   --   --   --   HGB 11.1*   < > 9.2* 9.2* 8.4* 9.3* 9.9*  HCT 36.0   < > 29.5* 29.9* 26.8* 29.0* 31.8*  MCV 91.8  --  91.6  --  91.5 89.0 89.3  PLT 346  --  242  --  218 226 239   < > = values in this interval not displayed.   Cardiac Enzymes: No results for input(s): "CKTOTAL", "CKMB", "CKMBINDEX", "TROPONINI" in the last 168 hours. BNP: Invalid input(s): "POCBNP" CBG: Recent Labs  Lab 07/27/22 1918 07/27/22 2334 07/28/22 0427 07/28/22 0801 07/28/22 1114  GLUCAP 86 108* 86 95 124*   D-Dimer No results for input(s): "DDIMER" in the last 72 hours. Hgb A1c No results for input(s): "HGBA1C" in the last 72 hours. Lipid Profile No results for input(s): "CHOL", "HDL", "LDLCALC", "TRIG", "CHOLHDL", "LDLDIRECT" in the last 72 hours. Thyroid function studies No results for input(s): "TSH", "T4TOTAL", "T3FREE", "THYROIDAB" in the last 72 hours.  Invalid input(s): "FREET3" Anemia work up No results for input(s): "VITAMINB12", "FOLATE", "FERRITIN", "TIBC", "IRON", "RETICCTPCT" in the last 72 hours. Urinalysis    Component Value Date/Time   COLORURINE YELLOW (A) 07/23/2022 1109   APPEARANCEUR HAZY (A) 07/23/2022 1109   APPEARANCEUR Cloudy (A) 01/06/2020 1454   LABSPEC 1.009 07/23/2022 1109   LABSPEC 1.005 06/27/2014 1955   PHURINE 5.0 07/23/2022 1109   GLUCOSEU NEGATIVE 07/23/2022 1109   GLUCOSEU Negative 06/27/2014 1955   HGBUR NEGATIVE 07/23/2022 1109   BILIRUBINUR NEGATIVE 07/23/2022 1109   BILIRUBINUR Negative 01/06/2020 1454   BILIRUBINUR Negative 06/27/2014 1955   KETONESUR NEGATIVE 07/23/2022 1109   PROTEINUR NEGATIVE 07/23/2022 1109   UROBILINOGEN 0.2 09/06/2015 1515   NITRITE NEGATIVE 07/23/2022 1109   LEUKOCYTESUR NEGATIVE 07/23/2022 1109   LEUKOCYTESUR Negative 06/27/2014 1955   Sepsis Labs Recent Labs  Lab  07/24/22 0519 07/26/22 0435 07/27/22 0530 07/28/22 0420  WBC 7.6 7.4 8.5 8.6   Microbiology Recent Results (from the past 240 hour(s))  MRSA Next Gen by PCR, Nasal     Status: Abnormal   Collection Time: 07/23/22  6:56 PM   Specimen: Nasal Mucosa; Nasal Swab  Result Value Ref Range Status   MRSA by PCR Next Gen DETECTED (A) NOT DETECTED Final    Comment:  RESULT CALLED TO, READ BACK BY AND VERIFIED WITH: MARISSA GRAHAM 07/23/22 2048 MU (NOTE) The GeneXpert MRSA Assay (FDA approved for NASAL specimens only), is one component of a comprehensive MRSA colonization surveillance program. It is not intended to diagnose MRSA infection nor to guide or monitor treatment for MRSA infections. Test performance is not FDA approved in patients less than 82 years old. Performed at [facility], [address] [place], [place] [address]      Time coordinating discharge: Over 30 minutes  SIGNED:   Wyvonnia Dusky, MD  [facility] 07/28/2022, 12:20 PM Pager   If 7PM-7AM, please contact night-coverage [email or web address]

## 2022-07-28 NOTE — TOC Transition Note (Addendum)
Transition of Care [facility]) - CM/SW Discharge Note   Patient Details  Name: Catherine [name] MRN: [id number] Date of Birth: 1941-03-25  [facility] CM/SW Contact:  Izola Price, RN Phone Number: 07/28/2022, 12:50 PM   Clinical Narrative:  8/26: Discharging to [place] today. Notified AC and patient going to room 608B. Call report to [phone].  Spouse says he is unable to handle patient and transporting to facility and will accept charge for EMS transport via [facility]. Simmie Davies RN CM   213 pm: [facility] called to transport to facility. Simmie Davies RN CM     Final next level of care: Skilled Nursing Facility Barriers to Discharge: Barriers Resolved   Patient Goals and CMS Choice        Discharge Placement              Patient chooses bed at: [facility]   Name of family member notified: Shalynn, Jorstad (Spouse)   [phone] (Mobile) Patient and family notified of of transfer: 07/28/22  Discharge Plan and Services     Post Acute Care Choice: [place]          DME Arranged: N/A DME Agency: NA       HH Arranged: NA [place] Agency: NA        Social Determinants of Health (SDOH) Interventions     Readmission Risk Interventions    07/25/2022   10:48 AM  Readmission Risk Prevention Plan  PCP or Specialist appointment within 3-5 days of discharge Complete  SW Recovery Care/Counseling Consult Complete  Palliative Care Screening Not [place] Complete

## 2022-07-30 DIAGNOSIS — R339 Retention of urine, unspecified: Secondary | ICD-10-CM | POA: Diagnosis not present

## 2022-07-30 DIAGNOSIS — M6281 Muscle weakness (generalized): Secondary | ICD-10-CM | POA: Diagnosis not present

## 2022-07-30 DIAGNOSIS — K922 Gastrointestinal hemorrhage, unspecified: Secondary | ICD-10-CM | POA: Diagnosis not present

## 2022-08-01 ENCOUNTER — Telehealth (HOSPITAL_COMMUNITY): Payer: Medicare Other | Admitting: Psychiatry

## 2022-08-01 DIAGNOSIS — K922 Gastrointestinal hemorrhage, unspecified: Secondary | ICD-10-CM | POA: Diagnosis not present

## 2022-08-01 DIAGNOSIS — I951 Orthostatic hypotension: Secondary | ICD-10-CM | POA: Diagnosis not present

## 2022-08-02 DIAGNOSIS — E039 Hypothyroidism, unspecified: Secondary | ICD-10-CM | POA: Diagnosis not present

## 2022-08-02 DIAGNOSIS — I10 Essential (primary) hypertension: Secondary | ICD-10-CM | POA: Diagnosis not present

## 2022-08-02 DIAGNOSIS — E782 Mixed hyperlipidemia: Secondary | ICD-10-CM | POA: Diagnosis not present

## 2022-08-03 DIAGNOSIS — K922 Gastrointestinal hemorrhage, unspecified: Secondary | ICD-10-CM | POA: Diagnosis not present

## 2022-08-03 DIAGNOSIS — N182 Chronic kidney disease, stage 2 (mild): Secondary | ICD-10-CM | POA: Diagnosis not present

## 2022-08-03 DIAGNOSIS — I951 Orthostatic hypotension: Secondary | ICD-10-CM | POA: Diagnosis not present

## 2022-08-08 DIAGNOSIS — N182 Chronic kidney disease, stage 2 (mild): Secondary | ICD-10-CM | POA: Diagnosis not present

## 2022-08-08 DIAGNOSIS — K922 Gastrointestinal hemorrhage, unspecified: Secondary | ICD-10-CM | POA: Diagnosis not present

## 2022-08-08 DIAGNOSIS — M6281 Muscle weakness (generalized): Secondary | ICD-10-CM | POA: Diagnosis not present

## 2022-08-08 DIAGNOSIS — I2510 Atherosclerotic heart disease of native coronary artery without angina pectoris: Secondary | ICD-10-CM | POA: Diagnosis not present

## 2022-08-13 ENCOUNTER — Other Ambulatory Visit (HOSPITAL_COMMUNITY): Payer: Self-pay

## 2022-08-14 DIAGNOSIS — K5521 Angiodysplasia of colon with hemorrhage: Secondary | ICD-10-CM | POA: Diagnosis not present

## 2022-08-14 DIAGNOSIS — I951 Orthostatic hypotension: Secondary | ICD-10-CM | POA: Diagnosis not present

## 2022-08-14 DIAGNOSIS — I5032 Chronic diastolic (congestive) heart failure: Secondary | ICD-10-CM | POA: Diagnosis not present

## 2022-08-22 ENCOUNTER — Inpatient Hospital Stay: Payer: Medicare Other | Admitting: Adult Health

## 2022-08-22 DIAGNOSIS — I951 Orthostatic hypotension: Secondary | ICD-10-CM | POA: Diagnosis not present

## 2022-08-22 DIAGNOSIS — K5521 Angiodysplasia of colon with hemorrhage: Secondary | ICD-10-CM | POA: Diagnosis not present

## 2022-08-22 DIAGNOSIS — I5032 Chronic diastolic (congestive) heart failure: Secondary | ICD-10-CM | POA: Diagnosis not present

## 2022-08-29 DIAGNOSIS — K5521 Angiodysplasia of colon with hemorrhage: Secondary | ICD-10-CM | POA: Diagnosis not present

## 2022-08-29 DIAGNOSIS — I5032 Chronic diastolic (congestive) heart failure: Secondary | ICD-10-CM | POA: Diagnosis not present

## 2022-08-29 DIAGNOSIS — I951 Orthostatic hypotension: Secondary | ICD-10-CM | POA: Diagnosis not present

## 2022-08-30 DIAGNOSIS — I10 Essential (primary) hypertension: Secondary | ICD-10-CM | POA: Diagnosis not present

## 2022-08-30 DIAGNOSIS — E785 Hyperlipidemia, unspecified: Secondary | ICD-10-CM | POA: Diagnosis not present

## 2022-08-30 DIAGNOSIS — E1165 Type 2 diabetes mellitus with hyperglycemia: Secondary | ICD-10-CM | POA: Diagnosis not present

## 2022-08-30 DIAGNOSIS — F32A Depression, unspecified: Secondary | ICD-10-CM | POA: Diagnosis not present

## 2022-09-05 DIAGNOSIS — K5521 Angiodysplasia of colon with hemorrhage: Secondary | ICD-10-CM | POA: Diagnosis not present

## 2022-09-05 DIAGNOSIS — I951 Orthostatic hypotension: Secondary | ICD-10-CM | POA: Diagnosis not present

## 2022-09-05 DIAGNOSIS — I5032 Chronic diastolic (congestive) heart failure: Secondary | ICD-10-CM | POA: Diagnosis not present

## 2022-09-06 ENCOUNTER — Encounter (HOSPITAL_COMMUNITY): Payer: Self-pay | Admitting: Psychiatry

## 2022-09-06 ENCOUNTER — Telehealth (HOSPITAL_BASED_OUTPATIENT_CLINIC_OR_DEPARTMENT_OTHER): Payer: Medicare Other | Admitting: Psychiatry

## 2022-09-06 VITALS — Wt 138.0 lb

## 2022-09-06 DIAGNOSIS — F331 Major depressive disorder, recurrent, moderate: Secondary | ICD-10-CM

## 2022-09-06 DIAGNOSIS — F09 Unspecified mental disorder due to known physiological condition: Secondary | ICD-10-CM

## 2022-09-06 DIAGNOSIS — F419 Anxiety disorder, unspecified: Secondary | ICD-10-CM | POA: Diagnosis not present

## 2022-09-06 MED ORDER — CLONAZEPAM 0.5 MG PO TABS: 0.5000 mg | ORAL_TABLET | Freq: Every day | ORAL | 2 refills | Status: DC

## 2022-09-06 MED ORDER — SERTRALINE HCL 100 MG PO TABS: 100.0000 mg | ORAL_TABLET | Freq: Every day | ORAL | 2 refills | Status: DC
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
## 2022-09-11 DIAGNOSIS — K5521 Angiodysplasia of colon with hemorrhage: Secondary | ICD-10-CM | POA: Diagnosis not present

## 2022-09-11 DIAGNOSIS — I5032 Chronic diastolic (congestive) heart failure: Secondary | ICD-10-CM | POA: Diagnosis not present

## 2022-09-11 DIAGNOSIS — I951 Orthostatic hypotension: Secondary | ICD-10-CM | POA: Diagnosis not present

## 2022-09-19 ENCOUNTER — Ambulatory Visit: Payer: Medicare Other | Admitting: Cardiology

## 2022-09-26 ENCOUNTER — Telehealth: Payer: Self-pay | Admitting: Family Medicine

## 2022-09-26 DIAGNOSIS — I5032 Chronic diastolic (congestive) heart failure: Secondary | ICD-10-CM | POA: Diagnosis not present

## 2022-09-26 DIAGNOSIS — K5521 Angiodysplasia of colon with hemorrhage: Secondary | ICD-10-CM | POA: Diagnosis not present

## 2022-09-26 DIAGNOSIS — I951 Orthostatic hypotension: Secondary | ICD-10-CM | POA: Diagnosis not present

## 2022-09-26 NOTE — Telephone Encounter (Signed)
LVM informing pt of need to reschedule 10/31 appointment - NP out

## 2022-09-27 ENCOUNTER — Other Ambulatory Visit: Payer: Self-pay

## 2022-09-27 ENCOUNTER — Ambulatory Visit: Payer: Medicare Other | Admitting: Gastroenterology

## 2022-09-27 NOTE — Progress Notes (Deleted)
Jonathon Bellows MD, MRCP(U.K) [address]  [place]  [place], [place] [address]  Main: [phone]  Fax: [phone]   Primary Care Physician: Danelle Berry, NP  Primary Gastroenterologist:  Dr. Jonathon Bellows   No chief complaint on file.   HPI: Catherine [name] is a 81 y.o. female  Summary of history :   Initially referred and seen on 03/12/2021 for chronic nausea for  at least 2 years.  Was last seen in May 2022 for nausea related to constipation, possible gastroparesis and effects of artificial sugars. nausea usually early in the morning but sometimes can wake her up in the night.  Denies any vomiting.  Does have some sensation of fullness and early satiety abdominal distention and discomfort over the lower part of the abdomen.  Partially relieved after bowel movement.  She takes Metamucil daily and does consume some artificial sugars.  Denies any NSAID use.  Has a bowel movement every day soft in nature. She was diabetic in the past but now is prediabetic.  She is on pantoprazole 20 mg a day.Llast colonoscopy was back in 2007  2 mm polyp was resected in the sigmoid colon and sent to pathology.   04/04/2021:  Hiatal hernia and gastritis on bx which is mild, chronic and inactive  Gastric emptying study has been scheduled for 04/28/2021    Interval history  04/10/2021-09/27/2022   She presented to the hospital back in 07/22/2022 while on aspirin and Plavix with rectal bleeding had a CT scan of the abdomen showed diverticulosis of the colon.  Had a colonoscopy by Dr. 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  Since her last visit the nausea is better but continues to have on and off diarrhea.  Not taking any MiraLAX.       Current Outpatient Medications  Medication Sig  Dispense Refill   acetaminophen (TYLENOL) 325 MG tablet Take 2 tablets (650 mg total) by mouth every 4 (four) hours as needed. 20 tablet 0   aspirin EC 81 MG tablet Take 1 tablet (81 mg total) by mouth daily. Swallow whole. Hold this medication until you see your PCP and/or cardiologist 90 tablet 3   atorvastatin (LIPITOR) 80 MG tablet Take 1 tablet (80 mg total) by mouth daily. 30 tablet 0   clonazePAM (KLONOPIN) 0.5 MG tablet Take 1 tablet (0.5 mg total) by mouth at bedtime. 30 tablet 2   clopidogrel (PLAVIX) 75 MG tablet Take 1 tablet (75 mg total) by mouth daily. 30 tablet 0   famotidine (PEPCID) 40 MG tablet Take 40 mg by mouth daily.     isosorbide mononitrate (IMDUR) 30 MG 24 hr tablet TAKE 1/2 OF A TABLET (15 MG TOTAL) BY MOUTH DAILY (Patient taking differently: 15 mg daily.) 45 tablet 1   levothyroxine (SYNTHROID) 100 MCG tablet Take 1 tablet (100 mcg total) by mouth daily at 6 (six) AM. 30 tablet 0   losartan (COZAAR) 25 MG tablet Take 1/2 tablet (12.5 mg total) by mouth daily. 30 tablet 0   metoprolol tartrate (LOPRESSOR) 25 MG tablet Take 1/2 tablet (12.5 mg total) by mouth 2 (two) times daily. 30 tablet 0   Multiple Vitamin (MULTIVITAMIN WITH MINERALS) TABS tablet Take 1 tablet by mouth daily.  nitroGLYCERIN (NITROSTAT) 0.4 MG SL tablet Place 1 tablet (0.4 mg total) under the tongue every 5 (five) minutes as needed for chest pain. 25 tablet 3   pantoprazole (PROTONIX) 40 MG tablet Take 40 mg by mouth daily.     polyethylene glycol powder (GLYCOLAX/MIRALAX) 17 GM/SCOOP powder Take 17 g by mouth daily. 238 g 0   potassium chloride (KLOR-CON) 10 MEQ tablet TAKE 1 TABLET BY MOUTH EVERY OTHER DAY (Patient taking differently: Take 10 mEq by mouth every other day.) 45 tablet 0   sertraline (ZOLOFT) 100 MG tablet Take 1 tablet (100 mg total) by mouth daily. 30 tablet 2   traZODone (DESYREL) 50 MG tablet Take 50 mg by mouth at bedtime.     Zinc Oxide (TRIPLE PASTE) 12.8 % ointment Apply  topically 4 (four) times daily. 56.7 g 0   No current facility-administered medications for this visit.    Allergies as of 09/27/2022 - Review Complete 09/06/2022  Allergen Reaction Noted   Tetracyclines & related Anaphylaxis and Rash 01/31/2020   Sulfa antibiotics Hives 01/14/2012   Latex Rash 08/05/2015    ROS:  General: Negative for anorexia, weight loss, fever, chills, fatigue, weakness. ENT: Negative for hoarseness, difficulty swallowing , nasal congestion. CV: Negative for chest pain, angina, palpitations, dyspnea on exertion, peripheral edema.  Respiratory: Negative for dyspnea at rest, dyspnea on exertion, cough, sputum, wheezing.  GI: See history of present illness. GU:  Negative for dysuria, hematuria, urinary incontinence, urinary frequency, nocturnal urination.  Endo: Negative for unusual weight change.    Physical Examination:   There were no vitals taken for this visit.  General: Well-nourished, well-developed in no acute distress.  Eyes: No icterus. Conjunctivae pink. Mouth: Oropharyngeal mucosa moist and pink , no lesions erythema or exudate. Lungs: Clear to auscultation bilaterally. Non-labored. Heart: Regular rate and rhythm, no murmurs rubs or gallops.  Abdomen: Bowel sounds are normal, nontender, nondistended, no hepatosplenomegaly or masses, no abdominal bruits or hernia , no rebound or guarding.   Extremities: No lower extremity edema. No clubbing or deformities. Neuro: Alert and oriented x 3.  Grossly intact. Skin: Warm and dry, no jaundice.   Psych: Alert and cooperative, normal mood and affect.   Imaging Studies: No results found.  Assessment and Plan:   Catherine [name] is a 81 y.o. y/o female  here to follow up  for chronic nausea for many years.  Symptoms suggestive of a differential diagnosis of gastroparesis versus IBS constipation versus gas and bloating.  CT scan of the abdomen February 2022 showed stool throughout the colon.  I suspect that  the nausea is probably a combination of acid reflux and bloating from constipation as well as effects of artificial sugars.  Diarrhea could very well likely be an overflow diarrhea  Recent hospitalization in 07/22/2022 for rectal bleeding colonoscopy showed AVM in the descending colon that was ablated with APC.  She was on dual antiplatelet therapy at that point of time.    Plan Check CBC     Dr Jonathon Bellows  MD,MRCP [facility]) Follow up in ***

## 2022-10-01 ENCOUNTER — Other Ambulatory Visit (HOSPITAL_COMMUNITY): Payer: Self-pay | Admitting: Psychiatry

## 2022-10-01 DIAGNOSIS — F331 Major depressive disorder, recurrent, moderate: Secondary | ICD-10-CM

## 2022-10-02 ENCOUNTER — Inpatient Hospital Stay: Payer: Medicare Other | Admitting: Family Medicine

## 2022-10-09 DIAGNOSIS — I130 Hypertensive heart and chronic kidney disease with heart failure and stage 1 through stage 4 chronic kidney disease, or unspecified chronic kidney disease: Secondary | ICD-10-CM | POA: Diagnosis not present

## 2022-10-09 DIAGNOSIS — I350 Nonrheumatic aortic (valve) stenosis: Secondary | ICD-10-CM | POA: Diagnosis not present

## 2022-10-09 DIAGNOSIS — K581 Irritable bowel syndrome with constipation: Secondary | ICD-10-CM | POA: Diagnosis not present

## 2022-10-09 DIAGNOSIS — K219 Gastro-esophageal reflux disease without esophagitis: Secondary | ICD-10-CM | POA: Diagnosis not present

## 2022-10-09 DIAGNOSIS — E1122 Type 2 diabetes mellitus with diabetic chronic kidney disease: Secondary | ICD-10-CM | POA: Diagnosis not present

## 2022-10-09 DIAGNOSIS — Z8673 Personal history of transient ischemic attack (TIA), and cerebral infarction without residual deficits: Secondary | ICD-10-CM | POA: Diagnosis not present

## 2022-10-09 DIAGNOSIS — Z7902 Long term (current) use of antithrombotics/antiplatelets: Secondary | ICD-10-CM | POA: Diagnosis not present

## 2022-10-09 DIAGNOSIS — M359 Systemic involvement of connective tissue, unspecified: Secondary | ICD-10-CM | POA: Diagnosis not present

## 2022-10-09 DIAGNOSIS — Z9181 History of falling: Secondary | ICD-10-CM | POA: Diagnosis not present

## 2022-10-09 DIAGNOSIS — I5032 Chronic diastolic (congestive) heart failure: Secondary | ICD-10-CM | POA: Diagnosis not present

## 2022-10-09 DIAGNOSIS — J45909 Unspecified asthma, uncomplicated: Secondary | ICD-10-CM | POA: Diagnosis not present

## 2022-10-09 DIAGNOSIS — M069 Rheumatoid arthritis, unspecified: Secondary | ICD-10-CM | POA: Diagnosis not present

## 2022-10-09 DIAGNOSIS — Z7951 Long term (current) use of inhaled steroids: Secondary | ICD-10-CM | POA: Diagnosis not present

## 2022-10-09 DIAGNOSIS — M797 Fibromyalgia: Secondary | ICD-10-CM | POA: Diagnosis not present

## 2022-10-09 DIAGNOSIS — N182 Chronic kidney disease, stage 2 (mild): Secondary | ICD-10-CM | POA: Diagnosis not present

## 2022-10-09 DIAGNOSIS — Z87440 Personal history of urinary (tract) infections: Secondary | ICD-10-CM | POA: Diagnosis not present

## 2022-10-09 DIAGNOSIS — Z853 Personal history of malignant neoplasm of breast: Secondary | ICD-10-CM | POA: Diagnosis not present

## 2022-10-09 DIAGNOSIS — I442 Atrioventricular block, complete: Secondary | ICD-10-CM | POA: Diagnosis not present

## 2022-10-09 DIAGNOSIS — D631 Anemia in chronic kidney disease: Secondary | ICD-10-CM | POA: Diagnosis not present

## 2022-10-09 DIAGNOSIS — E039 Hypothyroidism, unspecified: Secondary | ICD-10-CM | POA: Diagnosis not present

## 2022-10-09 DIAGNOSIS — G8929 Other chronic pain: Secondary | ICD-10-CM | POA: Diagnosis not present

## 2022-10-09 DIAGNOSIS — I2510 Atherosclerotic heart disease of native coronary artery without angina pectoris: Secondary | ICD-10-CM | POA: Diagnosis not present

## 2022-10-09 DIAGNOSIS — E7800 Pure hypercholesterolemia, unspecified: Secondary | ICD-10-CM | POA: Diagnosis not present

## 2022-10-09 DIAGNOSIS — F32A Depression, unspecified: Secondary | ICD-10-CM | POA: Diagnosis not present

## 2022-10-11 ENCOUNTER — Ambulatory Visit (INDEPENDENT_AMBULATORY_CARE_PROVIDER_SITE_OTHER): Payer: Medicare Other

## 2022-10-11 DIAGNOSIS — I442 Atrioventricular block, complete: Secondary | ICD-10-CM

## 2022-10-11 LAB — CUP PACEART REMOTE DEVICE CHECK
Battery Remaining Longevity: 43 mo
Battery Remaining Percentage: 34 %
Battery Voltage: 2.93 V
Brady Statistic AP VP Percent: 10 %
Brady Statistic AP VS Percent: 1.5 %
Brady Statistic AS VP Percent: 87 %
Brady Statistic AS VS Percent: 1 %
Brady Statistic RA Percent Paced: 11 %
Brady Statistic RV Percent Paced: 97 %
Date Time Interrogation Session: 20231109020013
Implantable Lead Connection Status: 753985
Implantable Lead Connection Status: 753985
Implantable Lead Implant Date: 20160928
Implantable Lead Implant Date: 20160928
Implantable Lead Location: 753859
Implantable Lead Location: 753860
Implantable Pulse Generator Implant Date: 20160928
Lead Channel Impedance Value: 390 Ohm
Lead Channel Impedance Value: 530 Ohm
Lead Channel Pacing Threshold Amplitude: 0.625 V
Lead Channel Pacing Threshold Amplitude: 0.875 V
Lead Channel Pacing Threshold Pulse Width: 0.4 ms
Lead Channel Pacing Threshold Pulse Width: 0.4 ms
Lead Channel Sensing Intrinsic Amplitude: 3.1 mV
Lead Channel Sensing Intrinsic Amplitude: 3.5 mV
Lead Channel Setting Pacing Amplitude: 0.875
Lead Channel Setting Pacing Amplitude: 1.875
Lead Channel Setting Pacing Pulse Width: 0.4 ms
Lead Channel Setting Sensing Sensitivity: 2.5 mV
Pulse Gen Model: 2240
Pulse Gen Serial Number: 7779295

## 2022-10-15 NOTE — Progress Notes (Deleted)
Date:  10/15/2022   ID:  Catherine [name], DOB October 02, 1941, MRN [id number]  Patient Location:  [address] [place] [address]   Provider location:   [facility], [place] office  PCP:  Danelle Berry, NP  Cardiologist:  [facility]  No chief complaint on file.   History of Present Illness:    Catherine [name] is a 81 y.o. female  past medical history of CAD  Aortic stenosis s/p TAVR in 12/8561 complicated by complete heart block s/p PPM complicated by hemothorax requiring chest tube.  Hypercholesteremia TIA CVA Chronic diastolic congestive heart failure Syncope Cardiac catheterization 2000 1670% mid LAD disease Who presents for follow-up of her aortic valve stenosis, pacemaker, diastolic CHF  Last seen in clinic April 2022  Seen in the hospital April 2023 for chest pain, records reviewed It was felt to be more musculoskeletal, cardiac work-up negative Was started on isosorbide 15 mg daily  Echocardiogram performed and discussed with her on today's visit  1. Left ventricular ejection fraction, by estimation, is 55%. The left  ventricle has normal function. The left ventricle demonstrates regional  wall motion abnormalities (septal wall motion abnormality likely secondary  to conduction abnormality). Left  ventricular diastolic parameters are consistent with Grade I diastolic  dysfunction (impaired relaxation).   2. Right ventricular systolic function is normal. The right ventricular  size is normal.   3. The mitral valve is normal in structure. No evidence of mitral valve  regurgitation. No evidence of mitral stenosis.   4. The aortic valve has been repaired/replaced. Aortic valve  regurgitation is not visualized. No aortic stenosis is present. Aortic  valve area, by VTI measures 2.39 cm. Aortic valve mean gradient measures  4.0 mmHg.   5. The inferior vena cava is normal in size with greater than 50%  respiratory variability,  suggesting right atrial pressure of 3 mmHg.   Chest wall pain comes and goes, periodically takes nitro but nitro was outdated Dizzy spells when going to bathroom, when standing  Cardiac 08/2015 reviewed on today's visit 1. Single-vessel coronary artery disease with heavy calcification of the entire LAD and a moderate eccentric mid LAD stenosis, estimated at 70%  EKG personally reviewed by myself on todays visit Paced rhythm rate 80 bpm  Other past medical history reviewed Prior history of chronic nausea Symptoms worse after eating, previously tried on nausea medications  polypharmacy   at 1 point was on multiple psychotropic/sedating medications.  Off lipitor, stopped on her own, "read bad stuff"   cath in 08/2015 showed single vessel CAD with heavy calcification of the entire LAD and a moderate eccentric mid LAD stenosis estimated at 70%.   known severe aortic stenosis Medical management was advised for her CAD  TAVR in 12/4968 which was complicated by complete heart block requiring PPM. Her PPM implantation was complicated by a hemothorax requiring chest tube placement.    Echo from 03/2017 showed and EF of 60-65%, no RWMA, Gr1DD, normal functioning aortic valve replacement without stenosis or regurgitation.   Past Medical History:  Diagnosis Date   Acute colitis    Anemia    CAD (coronary artery disease)    a. heavy calcification of the entire LAD & mod eccentric mid LAD stenosis, estimated @ 70%, mild nonobs stenosis of RCA and LCx, severely calcified Ao valve with restricted valve mobility and 2+ AI     Cancer (HCC) 1981   breast   CHB (complete heart block) (  [place]) 08/31/2015   [employer] DR  model [id number] (serial number  [id number] )    Chronic diastolic congestive heart failure ([place])    a. echo 08/31/2015: EF 65-70%, nl WM, GR1DD, Ao valve stent bioprosthesis was present and functioning nl, no regurg, LA mildly dilated, PASP 46 mm Hg   Collagen vascular disease (HCC)     Colon polyps    Depression    Diabetes mellitus type II    Fibromyalgia    HTN (hypertension)    Hypercholesteremia    Hypothyroidism    IBS (irritable bowel syndrome)    Presence of permanent cardiac pacemaker    Rectal bleeding    Rheumatoid arthritis(714.0)    S/P TAVR (transcatheter aortic valve replacement) 08/30/2015   26 mm Edwards Sapien 3 transcatheter heart valve placed via open right transfemoral approach   Shortness of breath dyspnea    Stenosis of aortic valve    Suicide attempt ([place])    Thyroid disease    Past Surgical History:  Procedure Laterality Date   ABDOMINAL SURGERY     Intestinal surgery   CARDIAC SURGERY     CESAREAN SECTION     x 2   COLONOSCOPY WITH PROPOFOL N/A 07/25/2022   Procedure: COLONOSCOPY WITH PROPOFOL;  Surgeon: Lesly Rubenstein, MD;  Location: [facility] ENDOSCOPY;  Service: Endoscopy;  Laterality: N/A;   EP IMPLANTABLE DEVICE N/A 08/31/2015   Procedure: Pacemaker Implant;  Surgeon: Will Meredith Leeds, MD; [place] (serial number  [id number] ); Laterality: Right   ESOPHAGOGASTRODUODENOSCOPY (EGD) WITH PROPOFOL N/A 04/04/2021   Procedure: ESOPHAGOGASTRODUODENOSCOPY (EGD) WITH PROPOFOL;  Surgeon: Jonathon Bellows, MD;  Location: [facility] ENDOSCOPY;  Service: Gastroenterology;  Laterality: N/A;   LAPAROSCOPY N/A 01/31/2020   Procedure: LAPAROSCOPY DIAGNOSTIC;  Surgeon: Kieth Brightly Arta Bruce, MD;  Location: [place];  Service: General;  Laterality: N/A;   LAPAROTOMY N/A 01/31/2020   Procedure: Exploratory Laparotomy;  Surgeon: Kieth Brightly Arta Bruce, MD;  Location: [place];  Service: General;  Laterality: N/A;   LEFT OOPHORECTOMY     LYSIS OF ADHESION  01/31/2020   Procedure: Lysis Of Adhesions, Ligation of Falciform Vein;  Surgeon: Mickeal Skinner, MD;  Location: [place];  Service: General;;   MASTECTOMY Left    polyp      self inflicted chest wound     [place]   lower back   TEE WITHOUT CARDIOVERSION N/A 08/30/2015    Procedure: TRANSESOPHAGEAL ECHOCARDIOGRAM (TEE);  Surgeon: Sherren Mocha, MD;  Location: [place];  Service: Open Heart Surgery;  Laterality: N/A;   TOTAL ABDOMINAL HYSTERECTOMY     TRANSCATHETER AORTIC VALVE REPLACEMENT, TRANSFEMORAL N/A 08/30/2015   Procedure: TRANSCATHETER AORTIC VALVE REPLACEMENT, TRANSFEMORAL;  Surgeon: Sherren Mocha, MD;  Location: [place];  Service: Open Heart Surgery;  Laterality: N/A;     No outpatient medications have been marked as taking for the 10/16/22 encounter (Appointment) with Minna Merritts, MD.     Allergies:   Tetracyclines & related, Sulfa antibiotics, and Latex   Social History   Tobacco Use   Smoking status: Never   Smokeless tobacco: Never  Vaping Use   Vaping Use: Never used  Substance Use Topics   Alcohol use: No    Alcohol/week: 0.0 standard drinks of alcohol   Drug use: No     Family Hx: The patient's family history includes Depression in her sister, sister, and sister; Hypertension in her mother and sister; Lung cancer in  her sister; Pneumonia in her brother; Stroke in her sister. There is no history of Heart attack.  ROS:   Please see the history of present illness.    Review of Systems  Constitutional: Negative.   HENT: Negative.    Respiratory: Negative.    Cardiovascular: Negative.   Gastrointestinal: Negative.   Musculoskeletal: Negative.        Gait instability, weakness  Neurological: Negative.   Psychiatric/Behavioral: Negative.    All other systems reviewed and are negative.   Labs/Other Tests and Data Reviewed:    Recent Labs: 06/11/2022: B Natriuretic Peptide 101.5 07/08/2022: TSH 0.048 07/12/2022: Magnesium 2.1 07/23/2022: ALT 15 07/28/2022: BUN 9; Creatinine, Ser 0.61; Hemoglobin 9.9; Platelets 239; Potassium 3.8; Sodium 139   Recent Lipid Panel Lab Results  Component Value Date/Time   CHOL 180 07/08/2022 12:11 PM   TRIG 105 07/08/2022 12:11 PM   HDL 47 07/08/2022 12:11 PM   CHOLHDL 3.8 07/08/2022 12:11 PM    LDLCALC 112 (H) 07/08/2022 12:11 PM    Wt Readings from Last 3 Encounters:  07/23/22 138 lb 14.4 oz (63 kg)  07/16/22 151 lb 0.2 oz (68.5 kg)  06/11/22 149 lb 14.6 oz (68 kg)     Exam:    Vital Signs: Vital signs may also be detailed in the HPI There were no vitals taken for this visit. Constitutional:  oriented to person, place, and time. No distress.  Presenting in a wheelchair HENT:  Head: Grossly normal Eyes:  no discharge. No scleral icterus.  Neck: No JVD, no carotid bruits  Cardiovascular: Regular rate and rhythm, no murmurs appreciated Pulmonary/Chest: Clear to auscultation bilaterally, no wheezes or rails Abdominal: Soft.  no distension.  no tenderness.  Musculoskeletal: Normal range of motion Neurological:  normal muscle tone. Coordination normal. No atrophy Skin: Skin warm and dry Psychiatric: normal affect, pleasant   ASSESSMENT & PLAN:    1. Chronic diastolic congestive heart failure (HCC) Appears euvolemic, no medication changes made Continue low-dose isosorbide and low-dose losartan, blood pressure stable   2. Complete heart block ([place]) Has pacemaker Followed by EP  3. Hyperlipidemia, unspecified hyperlipidemia type On Lipitor Lab work done through primary care   4. Hypertension, unspecified type Blood pressure is well controlled on today's visit. No changes made to the medications.   5. Aortic valve stenosis, etiology of cardiac valve disease unspecified S/p TAVR, Doing well Recent echocardiogram, valve functioning well   6.  Coronary artery disease with stable angina Some typical and atypical chest pain symptoms Evaluated by one of our partners in the hospital, felt to be more atypical We have refilled her nitro We have recommended if she starts taking more nitroglycerin sublingual that she call our office, stress test could be performed She does have residual 70% mid LAD disease which was left untreated in 2016 After long discussion she is  inclined not to do anything at this time    Total encounter time more than 40 minutes  Greater than 50% was spent in counseling and coordination of care with the patient    Signed, Ida Rogue, MD  10/15/2022 11:08 AM    [place] Office [address], [place], [place] [address]

## 2022-10-16 ENCOUNTER — Ambulatory Visit: Payer: Medicare Other | Admitting: Cardiovascular Disease

## 2022-10-16 DIAGNOSIS — Z950 Presence of cardiac pacemaker: Secondary | ICD-10-CM

## 2022-10-16 DIAGNOSIS — I25118 Atherosclerotic heart disease of native coronary artery with other forms of angina pectoris: Secondary | ICD-10-CM

## 2022-10-16 DIAGNOSIS — I442 Atrioventricular block, complete: Secondary | ICD-10-CM

## 2022-10-16 DIAGNOSIS — I5032 Chronic diastolic (congestive) heart failure: Secondary | ICD-10-CM

## 2022-10-16 DIAGNOSIS — I350 Nonrheumatic aortic (valve) stenosis: Secondary | ICD-10-CM

## 2022-10-16 DIAGNOSIS — Z953 Presence of xenogenic heart valve: Secondary | ICD-10-CM

## 2022-10-16 DIAGNOSIS — I10 Essential (primary) hypertension: Secondary | ICD-10-CM

## 2022-10-19 NOTE — Progress Notes (Signed)
Remote pacemaker transmission.   

## 2022-11-01 ENCOUNTER — Other Ambulatory Visit (HOSPITAL_COMMUNITY): Payer: Self-pay | Admitting: Psychiatry

## 2022-11-01 DIAGNOSIS — F331 Major depressive disorder, recurrent, moderate: Secondary | ICD-10-CM

## 2022-11-30 ENCOUNTER — Encounter: Payer: Self-pay | Admitting: Emergency Medicine

## 2022-11-30 ENCOUNTER — Emergency Department
Admission: EM | Admit: 2022-11-30 | Discharge: 2022-11-30 | Disposition: A | Discharge status: Home / Self Care | Payer: Medicare Other | Attending: Emergency Medicine | Admitting: Emergency Medicine

## 2022-11-30 ENCOUNTER — Other Ambulatory Visit: Payer: Self-pay

## 2022-11-30 DIAGNOSIS — Z743 Need for continuous supervision: Secondary | ICD-10-CM | POA: Diagnosis not present

## 2022-11-30 DIAGNOSIS — N3090 Cystitis, unspecified without hematuria: Secondary | ICD-10-CM | POA: Diagnosis not present

## 2022-11-30 DIAGNOSIS — E119 Type 2 diabetes mellitus without complications: Secondary | ICD-10-CM | POA: Insufficient documentation

## 2022-11-30 DIAGNOSIS — I10 Essential (primary) hypertension: Secondary | ICD-10-CM | POA: Diagnosis not present

## 2022-11-30 DIAGNOSIS — I499 Cardiac arrhythmia, unspecified: Secondary | ICD-10-CM | POA: Diagnosis not present

## 2022-11-30 DIAGNOSIS — R6889 Other general symptoms and signs: Secondary | ICD-10-CM | POA: Diagnosis not present

## 2022-11-30 DIAGNOSIS — R5383 Other fatigue: Secondary | ICD-10-CM | POA: Insufficient documentation

## 2022-11-30 DIAGNOSIS — R109 Unspecified abdominal pain: Secondary | ICD-10-CM | POA: Diagnosis not present

## 2022-11-30 LAB — COMPREHENSIVE METABOLIC PANEL
ALT: 8 U/L (ref 0–44)
AST: 20 U/L (ref 15–41)
Albumin: 4.2 g/dL (ref 3.5–5.0)
Alkaline Phosphatase: 71 U/L (ref 38–126)
Anion gap: 9 (ref 5–15)
BUN: 23 mg/dL (ref 8–23)
CO2: 25 mmol/L (ref 22–32)
Calcium: 10.2 mg/dL (ref 8.9–10.3)
Chloride: 102 mmol/L (ref 98–111)
Creatinine, Ser: 0.9 mg/dL (ref 0.44–1.00)
GFR, Estimated: >60 mL/min (ref >60)
Glucose, Bld: 110 mg/dL — ABNORMAL HIGH (ref 70–99)
Potassium: 4.4 mmol/L (ref 3.5–5.1)
Sodium: 136 mmol/L (ref 135–145)
Total Bilirubin: 0.8 mg/dL (ref 0.3–1.2)
Total Protein: 7.8 g/dL (ref 6.5–8.1)

## 2022-11-30 LAB — URINALYSIS, ROUTINE W REFLEX MICROSCOPIC
Bilirubin Urine: NEGATIVE
Glucose, UA: NEGATIVE mg/dL
Hgb urine dipstick: NEGATIVE
Ketones, ur: NEGATIVE mg/dL
Nitrite: POSITIVE — AB
Protein, ur: 30 mg/dL — AB
Specific Gravity, Urine: 1.009 (ref 1.005–1.030)
WBC, UA: >50 WBC/hpf — ABNORMAL HIGH (ref 0–5)
pH: 9 — ABNORMAL HIGH (ref 5.0–8.0)

## 2022-11-30 LAB — CBC WITH DIFFERENTIAL/PLATELET
Abs Immature Granulocytes: 0.03 10*3/uL (ref 0.00–0.07)
Basophils Absolute: 0 10*3/uL (ref 0.0–0.1)
Basophils Relative: 0 %
Eosinophils Absolute: 0.3 10*3/uL (ref 0.0–0.5)
Eosinophils Relative: 3 %
HCT: 36.9 % (ref 36.0–46.0)
Hemoglobin: 11.3 g/dL — ABNORMAL LOW (ref 12.0–15.0)
Immature Granulocytes: 0 %
Lymphocytes Relative: 17 %
Lymphs Abs: 1.7 10*3/uL (ref 0.7–4.0)
MCH: 24.2 pg — ABNORMAL LOW (ref 26.0–34.0)
MCHC: 30.6 g/dL (ref 30.0–36.0)
MCV: 79.2 fL — ABNORMAL LOW (ref 80.0–100.0)
Monocytes Absolute: 0.8 10*3/uL (ref 0.1–1.0)
Monocytes Relative: 8 %
Neutro Abs: 7.3 10*3/uL (ref 1.7–7.7)
Neutrophils Relative %: 72 %
Platelets: 331 10*3/uL (ref 150–400)
RBC: 4.66 MIL/uL (ref 3.87–5.11)
RDW: 15.9 % — ABNORMAL HIGH (ref 11.5–15.5)
WBC: 10.2 10*3/uL (ref 4.0–10.5)
nRBC: 0 % (ref 0.0–0.2)

## 2022-11-30 LAB — LIPASE, BLOOD: Lipase: 40 U/L (ref 11–51)

## 2022-11-30 MED ORDER — CEFDINIR 300 MG PO CAPS: 300.0000 mg | ORAL_CAPSULE | Freq: Two times a day (BID) | ORAL | 0 refills | Status: DC

## 2022-11-30 MED ORDER — CEFDINIR 300 MG PO CAPS
300.0000 mg | ORAL_CAPSULE | ORAL | Status: AC
  Administered 2022-11-30: 300 mg via ORAL
  Filled 2022-11-30: qty 1

## 2022-11-30 NOTE — ED Provider Triage Note (Signed)
Emergency Medicine Provider Triage Evaluation Note  Catherine [name] , a 81 y.o. female  was evaluated in triage.  Pt complains of abdominal pain for "as long as I can remember." She denies any changes today. No n/v/d fever. No CP/SOB.   Review of Systems  Positive: Abd pain Negative: N/v/d  Physical Exam  There were no vitals taken for this visit. Gen:   Awake, no distress   Resp:  Normal effort  MSK:   Moves extremities without difficulty  Other:    Medical Decision Making  Medically screening exam initiated at 12:37 PM.  Appropriate orders placed.  AHJANAE CASSEL was informed that the remainder of the evaluation will be completed by another provider, this initial triage assessment does not replace that evaluation, and the importance of remaining in the ED until their evaluation is complete.     Marquette Old, PA-C 11/30/22 1242

## 2022-11-30 NOTE — ED Triage Notes (Signed)
Per EMS pt coming from [facility]  c/o chronic abdominal pain and weakness. Patient very anxious in triage.

## 2022-11-30 NOTE — ED Provider Notes (Signed)
[facility] Provider Note    Event Date/Time   First MD Initiated Contact with Patient 11/30/22 1610     (approximate)   History   Chief Complaint: Abdominal Pain   HPI  Catherine [name] is a 81 y.o. female with a history of hypertension, diabetes, rheumatoid arthritis, IBS who is brought to the ED today due to dysuria, urinary frequency, suprapubic pain, and fatigue.  This has been going on for a few days.  Denies flank pain fevers chills chest pain shortness of breath.     Physical Exam   Triage Vital Signs: ED Triage Vitals  Enc Vitals Group     BP 11/30/22 1240 (!) 153/84     Pulse Rate 11/30/22 1240 76     Resp 11/30/22 1240 18     Temp 11/30/22 1240 98.7 F (37.1 C)     Temp Source 11/30/22 1240 Oral     SpO2 11/30/22 1240 97 %     Weight 11/30/22 1243 138 lb (62.6 kg)     Height 11/30/22 1243 '5\' 2"'$  (1.575 m)     Head Circumference --      [facility] --      Pain Score 11/30/22 1241 5     Pain Loc --      Pain Edu? --      Excl. in [place]? --     Most recent vital signs: Vitals:   11/30/22 1240  BP: (!) 153/84  Pulse: 76  Resp: 18  Temp: 98.7 F (37.1 C)  SpO2: 97%    General: Awake, no distress.  CV:  Good peripheral perfusion.  Regular rate rhythm Resp:  Normal effort.  Abd:  No distention.  Soft with suprapubic tenderness.  No CVA tenderness Other:  No lower extremity edema.  Moist oral mucosa.   ED Results / Procedures / Treatments   Labs (all labs ordered are listed, but only abnormal results are displayed) Labs Reviewed  COMPREHENSIVE METABOLIC PANEL - Abnormal; Notable for the following components:      Result Value   Glucose, Bld 110 (*)    All other components within normal limits  CBC WITH DIFFERENTIAL/PLATELET - Abnormal; Notable for the following components:   Hemoglobin 11.3 (*)    MCV 79.2 (*)    MCH 24.2 (*)    RDW 15.9 (*)    All other components within normal limits  URINALYSIS, ROUTINE W REFLEX  MICROSCOPIC - Abnormal; Notable for the following components:   Color, Urine YELLOW (*)    APPearance HAZY (*)    pH 9.0 (*)    Protein, ur 30 (*)    Nitrite POSITIVE (*)    Leukocytes,Ua LARGE (*)    WBC, UA >50 (*)    Bacteria, UA FEW (*)    All other components within normal limits  URINE CULTURE  LIPASE, BLOOD     EKG    RADIOLOGY    PROCEDURES:  Procedures   MEDICATIONS ORDERED IN ED: Medications  cefdinir (OMNICEF) capsule 300 mg (has no administration in time range)     IMPRESSION / MDM / ASSESSMENT AND PLAN / ED COURSE  I reviewed the triage vital signs and the nursing notes.                              Differential diagnosis includes, but is not limited to, UTI, appendicitis, diverticulitis, bowel obstruction, colon mass  Patient's  presentation is most consistent with acute presentation with potential threat to life or bodily function.  Patient presents with lower abdominal pain with dysuria and urinary frequency.  Vital signs unremarkable.  Serum labs are okay, urinalysis shows a clear nitrite positive UTI.  Reviewed prior culture data which showed Macrobid resistance in the past.  She has a documented allergy to Bactrim.  Will prescribe Omnicef.  She is not septic, nontoxic.  Does not require admission and can be discharged home.       FINAL CLINICAL IMPRESSION(S) / ED DIAGNOSES   Final diagnoses:  Cystitis     Rx / DC Orders   ED Discharge Orders          Ordered    cefdinir (OMNICEF) 300 MG capsule  2 times daily        11/30/22 1643             Note:  This document was prepared using Dragon voice recognition software and may include unintentional dictation errors.   Carrie Mew, MD 11/30/22 [phone]

## 2022-12-02 LAB — URINE CULTURE: Culture: >=100000 — AB

## 2022-12-03 ENCOUNTER — Other Ambulatory Visit (HOSPITAL_COMMUNITY): Payer: Self-pay | Admitting: Psychiatry

## 2022-12-03 ENCOUNTER — Telehealth: Payer: Self-pay

## 2022-12-03 DIAGNOSIS — F331 Major depressive disorder, recurrent, moderate: Secondary | ICD-10-CM

## 2022-12-03 NOTE — Telephone Encounter (Signed)
Urine Cultures growing klebsiella aerogenes 100,000 CFU. Notified patient to stop taking cefdinir capsules and that ciprofloxacin prescription was called into their pharmacy.

## 2022-12-06 ENCOUNTER — Other Ambulatory Visit (HOSPITAL_COMMUNITY): Payer: Self-pay | Admitting: Psychiatry

## 2022-12-06 DIAGNOSIS — F331 Major depressive disorder, recurrent, moderate: Secondary | ICD-10-CM

## 2022-12-06 DIAGNOSIS — F419 Anxiety disorder, unspecified: Secondary | ICD-10-CM

## 2022-12-10 ENCOUNTER — Telehealth (HOSPITAL_COMMUNITY): Payer: Self-pay

## 2022-12-10 DIAGNOSIS — F419 Anxiety disorder, unspecified: Secondary | ICD-10-CM

## 2022-12-10 DIAGNOSIS — F331 Major depressive disorder, recurrent, moderate: Secondary | ICD-10-CM

## 2022-12-10 MED ORDER — CLONAZEPAM 0.5 MG PO TABS: 0.5000 mg | ORAL_TABLET | Freq: Every day | ORAL | 0 refills | Status: DC

## 2022-12-10 NOTE — Telephone Encounter (Signed)
Patient called to request a refill for the following medication Last visit 09/06/22 Next visit 12/13/22       Disp Refills Start End   clonazePAM (KLONOPIN) 0.5 MG tablet 30 tablet 2 09/06/2022    Sig - Route: Take 1 tablet (0.5 mg total) by mouth at bedtime. - Oral   Sent to pharmacy as: clonazePAM (KLONOPIN) 0.5 MG tablet   Notes to Pharmacy: No early refill.   E-Prescribing Status: Receipt confirmed by pharmacy (09/06/2022  3:39 PM EDT)    Preferred pharmacy  [id number] [place], [place]: [phone] Fax: [phone]

## 2022-12-10 NOTE — Telephone Encounter (Signed)
Send 30 days supply klonopin to her pharmacy. Please inform patient.

## 2022-12-11 NOTE — Telephone Encounter (Signed)
Patient informed. 

## 2022-12-13 ENCOUNTER — Ambulatory Visit (HOSPITAL_BASED_OUTPATIENT_CLINIC_OR_DEPARTMENT_OTHER): Payer: Medicare Other | Admitting: Psychiatry

## 2022-12-13 ENCOUNTER — Encounter (HOSPITAL_COMMUNITY): Payer: Self-pay | Admitting: Psychiatry

## 2022-12-13 DIAGNOSIS — F331 Major depressive disorder, recurrent, moderate: Secondary | ICD-10-CM | POA: Diagnosis not present

## 2022-12-13 DIAGNOSIS — F419 Anxiety disorder, unspecified: Secondary | ICD-10-CM | POA: Diagnosis not present

## 2022-12-13 MED ORDER — CLONAZEPAM 0.5 MG PO TABS: 0.5000 mg | ORAL_TABLET | Freq: Every day | ORAL | 5 refills | Status: DC

## 2022-12-13 MED ORDER — TRAZODONE HCL 50 MG PO TABS: 50.0000 mg | ORAL_TABLET | Freq: Every day | ORAL | 1 refills | Status: DC

## 2022-12-13 MED ORDER — CLONAZEPAM 0.5 MG PO TABS: 0.5000 mg | ORAL_TABLET | Freq: Every day | ORAL | 2 refills | Status: DC

## 2022-12-13 MED ORDER — TRAZODONE HCL 50 MG PO TABS: 50.0000 mg | ORAL_TABLET | Freq: Every day | ORAL | 0 refills | Status: DC

## 2022-12-13 MED ORDER — SERTRALINE HCL 100 MG PO TABS: 100.0000 mg | ORAL_TABLET | Freq: Every day | ORAL | 1 refills | Status: DC

## 2022-12-13 MED ORDER — SERTRALINE HCL 100 MG PO TABS: 100.0000 mg | ORAL_TABLET | Freq: Every day | ORAL | 0 refills | Status: DC
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
## 2023-01-10 ENCOUNTER — Ambulatory Visit (INDEPENDENT_AMBULATORY_CARE_PROVIDER_SITE_OTHER): Payer: Medicare Other

## 2023-01-10 DIAGNOSIS — I442 Atrioventricular block, complete: Secondary | ICD-10-CM

## 2023-01-10 LAB — CUP PACEART REMOTE DEVICE CHECK
Battery Remaining Longevity: 40 mo
Battery Remaining Percentage: 32 %
Battery Voltage: 2.93 V
Brady Statistic AP VP Percent: 14 %
Brady Statistic AP VS Percent: 2.7 %
Brady Statistic AS VP Percent: 81 %
Brady Statistic AS VS Percent: 1.5 %
Brady Statistic RA Percent Paced: 16 %
Brady Statistic RV Percent Paced: 95 %
Date Time Interrogation Session: 20240208020021
Implantable Lead Connection Status: 753985
Implantable Lead Connection Status: 753985
Implantable Lead Implant Date: 20160928
Implantable Lead Implant Date: 20160928
Implantable Lead Location: 753859
Implantable Lead Location: 753860
Implantable Pulse Generator Implant Date: 20160928
Lead Channel Impedance Value: 390 Ohm
Lead Channel Impedance Value: 530 Ohm
Lead Channel Pacing Threshold Amplitude: 0.625 V
Lead Channel Pacing Threshold Amplitude: 0.875 V
Lead Channel Pacing Threshold Pulse Width: 0.4 ms
Lead Channel Pacing Threshold Pulse Width: 0.4 ms
Lead Channel Sensing Intrinsic Amplitude: 3.1 mV
Lead Channel Sensing Intrinsic Amplitude: 4.3 mV
Lead Channel Setting Pacing Amplitude: 0.875
Lead Channel Setting Pacing Amplitude: 1.875
Lead Channel Setting Pacing Pulse Width: 0.4 ms
Lead Channel Setting Sensing Sensitivity: 2.5 mV
Pulse Gen Model: 2240
Pulse Gen Serial Number: 7779295

## 2023-01-12 ENCOUNTER — Other Ambulatory Visit (HOSPITAL_COMMUNITY): Payer: Self-pay | Admitting: Psychiatry

## 2023-01-12 DIAGNOSIS — F331 Major depressive disorder, recurrent, moderate: Secondary | ICD-10-CM

## 2023-01-21 ENCOUNTER — Other Ambulatory Visit: Payer: Self-pay | Admitting: Cardiovascular Disease

## 2023-01-21 NOTE — Telephone Encounter (Signed)
Good Morning,   Could you please schedule this patient an overdue 6 month follow up visit? The patient was last seen by Dr. Rockey Situ on 04/2022. Thank you so much.

## 2023-01-21 NOTE — Telephone Encounter (Signed)
Scheduled on 4/29

## 2023-02-04 NOTE — Progress Notes (Signed)
Remote pacemaker transmission.   

## 2023-02-21 ENCOUNTER — Telehealth: Payer: Self-pay

## 2023-02-21 NOTE — Telephone Encounter (Signed)
Patient daughter called stating tht she has a bladder infection and is requesting to get keflex or something call in

## 2023-02-23 ENCOUNTER — Other Ambulatory Visit: Payer: Self-pay

## 2023-02-23 ENCOUNTER — Emergency Department
Admission: EM | Admit: 2023-02-23 | Discharge: 2023-02-23 | Disposition: A | Discharge status: Home / Self Care | Payer: Medicare Other | Attending: Emergency Medicine | Admitting: Emergency Medicine

## 2023-02-23 ENCOUNTER — Emergency Department: Payer: Medicare Other

## 2023-02-23 DIAGNOSIS — Z743 Need for continuous supervision: Secondary | ICD-10-CM | POA: Diagnosis not present

## 2023-02-23 DIAGNOSIS — R1030 Lower abdominal pain, unspecified: Secondary | ICD-10-CM | POA: Diagnosis not present

## 2023-02-23 DIAGNOSIS — N200 Calculus of kidney: Secondary | ICD-10-CM

## 2023-02-23 DIAGNOSIS — N132 Hydronephrosis with renal and ureteral calculous obstruction: Secondary | ICD-10-CM | POA: Diagnosis not present

## 2023-02-23 DIAGNOSIS — I10 Essential (primary) hypertension: Secondary | ICD-10-CM | POA: Diagnosis not present

## 2023-02-23 DIAGNOSIS — R1084 Generalized abdominal pain: Secondary | ICD-10-CM | POA: Diagnosis not present

## 2023-02-23 DIAGNOSIS — R1032 Left lower quadrant pain: Secondary | ICD-10-CM | POA: Diagnosis present

## 2023-02-23 DIAGNOSIS — N281 Cyst of kidney, acquired: Secondary | ICD-10-CM | POA: Diagnosis not present

## 2023-02-23 LAB — COMPREHENSIVE METABOLIC PANEL
ALT: 9 U/L (ref 0–44)
AST: 19 U/L (ref 15–41)
Albumin: 3.7 g/dL (ref 3.5–5.0)
Alkaline Phosphatase: 45 U/L (ref 38–126)
Anion gap: 9 (ref 5–15)
BUN: 23 mg/dL (ref 8–23)
CO2: 24 mmol/L (ref 22–32)
Calcium: 9.3 mg/dL (ref 8.9–10.3)
Chloride: 103 mmol/L (ref 98–111)
Creatinine, Ser: 0.79 mg/dL (ref 0.44–1.00)
GFR, Estimated: >60 mL/min (ref >60)
Glucose, Bld: 78 mg/dL (ref 70–99)
Potassium: 3.8 mmol/L (ref 3.5–5.1)
Sodium: 136 mmol/L (ref 135–145)
Total Bilirubin: 0.6 mg/dL (ref 0.3–1.2)
Total Protein: 7 g/dL (ref 6.5–8.1)

## 2023-02-23 LAB — CBC WITH DIFFERENTIAL/PLATELET
Abs Immature Granulocytes: 0.03 10*3/uL (ref 0.00–0.07)
Basophils Absolute: 0.1 10*3/uL (ref 0.0–0.1)
Basophils Relative: 1 %
Eosinophils Absolute: 0.2 10*3/uL (ref 0.0–0.5)
Eosinophils Relative: 3 %
HCT: 33.5 % — ABNORMAL LOW (ref 36.0–46.0)
Hemoglobin: 10.2 g/dL — ABNORMAL LOW (ref 12.0–15.0)
Immature Granulocytes: 0 %
Lymphocytes Relative: 27 %
Lymphs Abs: 1.9 10*3/uL (ref 0.7–4.0)
MCH: 24.6 pg — ABNORMAL LOW (ref 26.0–34.0)
MCHC: 30.4 g/dL (ref 30.0–36.0)
MCV: 80.9 fL (ref 80.0–100.0)
Monocytes Absolute: 0.7 10*3/uL (ref 0.1–1.0)
Monocytes Relative: 10 %
Neutro Abs: 4.2 10*3/uL (ref 1.7–7.7)
Neutrophils Relative %: 59 %
Platelets: 261 10*3/uL (ref 150–400)
RBC: 4.14 MIL/uL (ref 3.87–5.11)
RDW: 14.9 % (ref 11.5–15.5)
WBC: 7.2 10*3/uL (ref 4.0–10.5)
nRBC: 0 % (ref 0.0–0.2)

## 2023-02-23 LAB — URINALYSIS, ROUTINE W REFLEX MICROSCOPIC
Bilirubin Urine: NEGATIVE
Glucose, UA: NEGATIVE mg/dL
Hgb urine dipstick: NEGATIVE
Ketones, ur: NEGATIVE mg/dL
Leukocytes,Ua: NEGATIVE
Nitrite: NEGATIVE
Protein, ur: NEGATIVE mg/dL
Specific Gravity, Urine: 1.017 (ref 1.005–1.030)
pH: 7 (ref 5.0–8.0)

## 2023-02-23 LAB — LIPASE, BLOOD: Lipase: 32 U/L (ref 11–51)

## 2023-02-23 MED ORDER — FENTANYL CITRATE PF 50 MCG/ML IJ SOSY
50.0000 ug | PREFILLED_SYRINGE | Freq: Once | INTRAMUSCULAR | Status: AC
  Administered 2023-02-23: 50 ug via INTRAVENOUS
  Filled 2023-02-23: qty 1

## 2023-02-23 MED ORDER — SODIUM CHLORIDE 0.9 % IV BOLUS
500.0000 mL | Freq: Once | INTRAVENOUS | Status: AC
  Administered 2023-02-23: 500 mL via INTRAVENOUS

## 2023-02-23 MED ORDER — OXYCODONE HCL 5 MG PO TABS: 5.0000 mg | ORAL_TABLET | Freq: Three times a day (TID) | ORAL | 0 refills | Status: DC | PRN

## 2023-02-23 MED ORDER — IOHEXOL 300 MG/ML  SOLN
100.0000 mL | Freq: Once | INTRAMUSCULAR | Status: AC | PRN
  Administered 2023-02-23: 100 mL via INTRAVENOUS

## 2023-02-23 NOTE — ED Notes (Signed)
ED Provider at bedside. 

## 2023-02-23 NOTE — ED Provider Notes (Signed)
[facility] Provider Note    Event Date/Time   First MD Initiated Contact with Patient 02/23/23 1515     (approximate)   History   Abdominal pain   HPI  Catherine [name] is a 82 y.o. female who presents to the emergency department today with primary concern for decreased bowel movements and abdominal pain.  Patient says that yesterday she had a small hard bowel movement that occurred after she gave herself an enema.  It has been a few days since she has had a good bowel movement.  This has been accompanied by abdominal pain located on the left side and nausea.  Additionally she feels like she might of had a fever yesterday.  Furthermore the patient has noticed decreased urine output.  She denies any burning with urination or bad odor to her urine.     Physical Exam   Triage Vital Signs: ED Triage Vitals  Enc Vitals Group     BP 02/23/23 1515 (!) 149/56     Pulse Rate 02/23/23 1515 73     Resp 02/23/23 1515 (!) 21     Temp 02/23/23 1516 98.2 F (36.8 C)     Temp Source 02/23/23 1516 Oral     SpO2 02/23/23 1515 100 %     Weight 02/23/23 1518 135 lb (61.2 kg)     Height 02/23/23 1518 5\' 2"  (1.575 m)     Head Circumference --      Peak Flow --      Pain Score 02/23/23 1517 8   Most recent vital signs: Vitals:   02/23/23 1515 02/23/23 1516  BP: (!) 149/56 (!) 149/56  Pulse: 73 73  Resp: (!) 21 (!) 26  Temp:  98.2 F (36.8 C)  SpO2: 100% 100%   General: Awake, alert, oriented. CV:  Good peripheral perfusion. Regular rate and rhythm. Resp:  Normal effort. Lungs clear. Abd:  No distention. Tender to palpation in the left abdomen.    ED Results / Procedures / Treatments   Labs (all labs ordered are listed, but only abnormal results are displayed) Labs Reviewed  CBC WITH DIFFERENTIAL/PLATELET - Abnormal; Notable for the following components:      Result Value   Hemoglobin 10.2 (*)    HCT 33.5 (*)    MCH 24.6 (*)    All other components  within normal limits  URINALYSIS, ROUTINE W REFLEX MICROSCOPIC - Abnormal; Notable for the following components:   Color, Urine YELLOW (*)    APPearance CLOUDY (*)    All other components within normal limits  COMPREHENSIVE METABOLIC PANEL  LIPASE, BLOOD     EKG  None   RADIOLOGY I independently interpreted and visualized the CT abd/pel. My interpretation: No free air. No free fluid. Radiology interpretation:  IMPRESSION:  1. Very tiny left UVJ stone with mild hydronephrosis.  2. Right kidney cysts.  3. Degenerative disc disease.     PROCEDURES:  Critical Care performed: No   MEDICATIONS ORDERED IN ED: Medications  sodium chloride 0.9 % bolus 500 mL (500 mLs Intravenous New Bag/Given 02/23/23 1538)     IMPRESSION / MDM / ASSESSMENT AND PLAN / ED COURSE  I reviewed the triage vital signs and the nursing notes.                              Differential diagnosis includes, but is not limited to, UTI, diverticulitis, kidney stone, constipation.  Patient's presentation is most consistent with acute presentation with potential threat to life or bodily function.  Patient presented to the emergency department today because of concern for left lower quadrant abdominal pain, decreased urination and defecation. On exam patient is tender in the left lower quadrant. Blood work without concerning electrolyte abnormality or leukocytosis. UA not consistent with infection. The patient did have CT performed which was consistent with UVJ stone. I do think this could explain the patient's symptoms. Discussed findings with patient and daughter at bedside. Will plan on discharging with pain medication. Discussed importance of hydration.   FINAL CLINICAL IMPRESSION(S) / ED DIAGNOSES   Final diagnoses:  Kidney stone     Note:  This document was prepared using Dragon voice recognition software and may include unintentional dictation errors.    Nance Pear, MD 02/23/23 [phone]

## 2023-02-23 NOTE — Discharge Instructions (Signed)
Please seek medical attention for any high fevers, chest pain, shortness of breath, change in behavior, persistent vomiting, bloody stool or any other new or concerning symptoms.  

## 2023-02-23 NOTE — ED Triage Notes (Signed)
Pt. To ED via medic for LLQ abdominal pain with constipation, decreased urine output, nausea. Pt. States had normal BM 3 days pta. Small hard BM yesterday. Pt. States she has large existing external hemorrhoids.

## 2023-02-24 ENCOUNTER — Other Ambulatory Visit: Payer: Self-pay | Admitting: Nurse Practitioner

## 2023-03-01 ENCOUNTER — Telehealth: Payer: Self-pay | Admitting: *Deleted

## 2023-03-01 NOTE — Telephone Encounter (Signed)
     Patient  visit on 02/23/2023  at [facility] was for treatment  Have you been able to follow up with your primary care physician? Yes has an appt to follow up  The patient was  able to obtain any needed medicine or equipment.  Are there diet recommendations that you are having difficulty following?  Patient expresses understanding of discharge instructions and education provided has no other needs at this time.   [place] [phone] [address] , [place] [address] Email : [email or web address]

## 2023-03-03 DIAGNOSIS — E782 Mixed hyperlipidemia: Secondary | ICD-10-CM | POA: Diagnosis not present

## 2023-03-03 DIAGNOSIS — E039 Hypothyroidism, unspecified: Secondary | ICD-10-CM | POA: Diagnosis not present

## 2023-03-03 DIAGNOSIS — I10 Essential (primary) hypertension: Secondary | ICD-10-CM | POA: Diagnosis not present

## 2023-03-04 ENCOUNTER — Telehealth: Payer: Self-pay

## 2023-03-04 NOTE — Telephone Encounter (Signed)
ED Discharge  Paisley,Anadalay C 81 years, Female  DOB: Apr 12, 1941  M: [phone]  __________________________________________________ ED Details Emergency Room Discharge Details ED Admit Date:: 02/23/2023 ED Discharge Date:: 02/23/2023 ICD Code/Description:: N20 : Calculus of kidney Treatment Location:: [facility] Engagement Notes Gorden Harms on 02/25/2023 09:37 AM Discharged from [facility] on 02/23/2023. N20 : Calculus of kidney Call Outreach Attempt #1 Date:: 02/25/2023 Time: PM Outcome:: Successful Patient Details What led you to go to the Emergency Room?: Patient stated she was having stomach pain and her hemorrhoids were hurting real bad. Was it trauma event or true emergency? (Chest pain, loss of consciousness, stroke, motor vehicle accident, fracture, fall, confusion, etc.): No What were your symptoms?: Patient stated stomach pain and constipation that continued to get worse. Duration of symptoms?: Days If you had symptoms, what things did you try to treat them?: Other, OTC medications, Prescription medications Details: suppository, Cream for hemorrhoids Did you call your provider first (before going to the Emergency Room)?: No Why?: Patient was not aware and felt really bad so her and her husband called 911. Are you aware that your doctor's office has afterhours / on call service?: No Did you consider any alternatives to the Emergency Room like an urgent care center?: No What outcome(s) were you expecting? (for example: Medications, Labs, Imaging, etc.): Patient stated she is still in pain but not like she was before when she first got there . Patient was given pain medication and was told her kidney stone was the main cause of her pain and it seems to be passing soon. Was this outcome(s) met?: No Was it recommended that you follow up with your health care provider or another provider?: Yes Do you already have a follow up with  your Provider or specialist?: No Scheduled appointment for Patient: No Do you feel comfortable and do you understand the instructions you received from the Emergency Room on warning signs or symptoms that would indicate a need to call the doctor(s), or be seen before your next appointment?: Yes What would you do if you had any of the warning signs we just discussed and unable to reach your PCP?: Call 911 Med review completed - Med list from Emergency Room discharge reviewed with patient and changes updated in clinic EMR: Yes Were any of the following done while you were in the Emergency Room?: X-ray, Labs Do you have someone at home to help you since you've been discharged from the hospital/ER/Urgent Care?: Yes Who/Relationship: Husband  Do you have any barriers to receiving the healthcare recommended for you?: Other Details: None.  Are there any questions or concerns you have regarding your Emergency Room visit?: Patient stated she lost the offices number , I gave it to her again and asked her to place it on the fridge. Also asked was it okay to speak to her daughter in regards of her health so that the daughter and I could help her in the best way possible she stated it was okay and i reached out to the daughter Glorian Bolles (Also a [facility] at [facility]) and [facility] because patient does not have a f/u with the office. Engagement Notes Charlann Lange on 02/25/2023 02:53 PM [facility] Chart Review: [facility] Assessment call time spent: 30 min 02/25/23  CPP Review: 3 mins 03/04/23  Clinical Lead Review Clinical Lead Follow Up ED discharge protocol reviewed and: No Follow-Up indicated.

## 2023-03-23 ENCOUNTER — Emergency Department
Admission: EM | Admit: 2023-03-23 | Discharge: 2023-03-23 | Disposition: A | Discharge status: Home / Self Care | Payer: Medicare Other | Attending: Emergency Medicine | Admitting: Emergency Medicine

## 2023-03-23 ENCOUNTER — Other Ambulatory Visit: Payer: Self-pay

## 2023-03-23 ENCOUNTER — Emergency Department: Payer: Medicare Other

## 2023-03-23 DIAGNOSIS — K5900 Constipation, unspecified: Secondary | ICD-10-CM | POA: Diagnosis not present

## 2023-03-23 DIAGNOSIS — E119 Type 2 diabetes mellitus without complications: Secondary | ICD-10-CM | POA: Diagnosis not present

## 2023-03-23 DIAGNOSIS — R1084 Generalized abdominal pain: Secondary | ICD-10-CM

## 2023-03-23 DIAGNOSIS — I5030 Unspecified diastolic (congestive) heart failure: Secondary | ICD-10-CM | POA: Diagnosis not present

## 2023-03-23 DIAGNOSIS — R32 Unspecified urinary incontinence: Secondary | ICD-10-CM | POA: Diagnosis not present

## 2023-03-23 DIAGNOSIS — I700 Atherosclerosis of aorta: Secondary | ICD-10-CM | POA: Diagnosis not present

## 2023-03-23 DIAGNOSIS — I10 Essential (primary) hypertension: Secondary | ICD-10-CM | POA: Diagnosis not present

## 2023-03-23 DIAGNOSIS — K644 Residual hemorrhoidal skin tags: Secondary | ICD-10-CM | POA: Insufficient documentation

## 2023-03-23 DIAGNOSIS — R109 Unspecified abdominal pain: Secondary | ICD-10-CM | POA: Diagnosis not present

## 2023-03-23 DIAGNOSIS — I110 Hypertensive heart disease with heart failure: Secondary | ICD-10-CM | POA: Insufficient documentation

## 2023-03-23 LAB — COMPREHENSIVE METABOLIC PANEL
ALT: 8 U/L (ref 0–44)
AST: 18 U/L (ref 15–41)
Albumin: 3.7 g/dL (ref 3.5–5.0)
Alkaline Phosphatase: 46 U/L (ref 38–126)
Anion gap: 8 (ref 5–15)
BUN: 19 mg/dL (ref 8–23)
CO2: 26 mmol/L (ref 22–32)
Calcium: 9.3 mg/dL (ref 8.9–10.3)
Chloride: 101 mmol/L (ref 98–111)
Creatinine, Ser: 0.71 mg/dL (ref 0.44–1.00)
GFR, Estimated: >60 mL/min (ref >60)
Glucose, Bld: 112 mg/dL — ABNORMAL HIGH (ref 70–99)
Potassium: 4 mmol/L (ref 3.5–5.1)
Sodium: 135 mmol/L (ref 135–145)
Total Bilirubin: 0.7 mg/dL (ref 0.3–1.2)
Total Protein: 6.9 g/dL (ref 6.5–8.1)

## 2023-03-23 LAB — CBC
HCT: 35.4 % — ABNORMAL LOW (ref 36.0–46.0)
Hemoglobin: 10.5 g/dL — ABNORMAL LOW (ref 12.0–15.0)
MCH: 24.8 pg — ABNORMAL LOW (ref 26.0–34.0)
MCHC: 29.7 g/dL — ABNORMAL LOW (ref 30.0–36.0)
MCV: 83.7 fL (ref 80.0–100.0)
Platelets: 272 10*3/uL (ref 150–400)
RBC: 4.23 MIL/uL (ref 3.87–5.11)
RDW: 15.3 % (ref 11.5–15.5)
WBC: 7 10*3/uL (ref 4.0–10.5)
nRBC: 0 % (ref 0.0–0.2)

## 2023-03-23 LAB — URINALYSIS, ROUTINE W REFLEX MICROSCOPIC
Bilirubin Urine: NEGATIVE
Glucose, UA: NEGATIVE mg/dL
Hgb urine dipstick: NEGATIVE
Ketones, ur: NEGATIVE mg/dL
Leukocytes,Ua: NEGATIVE
Nitrite: NEGATIVE
Protein, ur: NEGATIVE mg/dL
Specific Gravity, Urine: 1.028 (ref 1.005–1.030)
pH: 7 (ref 5.0–8.0)

## 2023-03-23 LAB — LIPASE, BLOOD: Lipase: 31 U/L (ref 11–51)

## 2023-03-23 MED ORDER — OXYCODONE-ACETAMINOPHEN 5-325 MG PO TABS
1.0000 | ORAL_TABLET | Freq: Once | ORAL | Status: AC
  Administered 2023-03-23: 1 via ORAL
  Filled 2023-03-23: qty 1

## 2023-03-23 MED ORDER — LACTATED RINGERS IV BOLUS
1000.0000 mL | Freq: Once | INTRAVENOUS | Status: AC
  Administered 2023-03-23: 1000 mL via INTRAVENOUS

## 2023-03-23 MED ORDER — MORPHINE SULFATE (PF) 4 MG/ML IV SOLN
4.0000 mg | Freq: Once | INTRAVENOUS | Status: AC
  Administered 2023-03-23: 4 mg via INTRAVENOUS
  Filled 2023-03-23: qty 1

## 2023-03-23 MED ORDER — IOHEXOL 300 MG/ML  SOLN
100.0000 mL | Freq: Once | INTRAMUSCULAR | Status: AC | PRN
  Administered 2023-03-23: 100 mL via INTRAVENOUS

## 2023-03-23 MED ORDER — ONDANSETRON HCL 4 MG/2ML IJ SOLN
4.0000 mg | Freq: Once | INTRAMUSCULAR | Status: AC
  Administered 2023-03-23: 4 mg via INTRAVENOUS
  Filled 2023-03-23: qty 2

## 2023-03-23 NOTE — ED Notes (Addendum)
Pt's family member approached the nursing station stating that she was in severe pain and has been since she arrived here. He is asking for her to receive some sort of pain medication. Provider notified. Received verbal orders for  morphine IV.

## 2023-03-23 NOTE — ED Provider Notes (Signed)
[facility] Provider Note    Event Date/Time   First MD Initiated Contact with Patient 03/23/23 1212     (approximate)   History   Chief Complaint Constipation and Hemorrhoids   HPI  Catherine [name] is a 82 y.o. female with past medical history of hypertension, diabetes, rheumatoid arthritis, diastolic CHF, and fibromyalgia who presents to the ED complaining of constipation and abdominal pain.  Patient reports that she has been dealing with increasing pain in her abdomen for about the past week associated with constipation and nausea.  She reports decreased oral intake but has not vomited, states she has not passed much more than a small amount of stool in the past week.  She additionally feels like she is having difficulty urinating, states she has been leaking urine and that it burns when she does so.  She denies any fevers and has not had any cough, chest pain, or shortness of breath.  She has taken over-the-counter laxatives without improvement in symptoms.  She also reports increased rectal pain, husband notes a hemorrhoid that he saw when giving her a suppository.     Physical Exam   Triage Vital Signs: ED Triage Vitals [03/23/23 1131]  Enc Vitals Group     BP 129/89     Pulse Rate 99     Resp 18     Temp 98.2 F (36.8 C)     Temp Source Oral     SpO2 98 %     Weight      Height      Head Circumference      Peak Flow      Pain Score 8     Pain Loc      Pain Edu?      Excl. in GC?     Most recent vital signs: Vitals:   03/23/23 1131  BP: 129/89  Pulse: 99  Resp: 18  Temp: 98.2 F (36.8 C)  SpO2: 98%    Constitutional: Alert and oriented. Eyes: Conjunctivae are normal. Head: Atraumatic. Nose: No congestion/rhinnorhea. Mouth/Throat: Mucous membranes are moist.  Cardiovascular: Normal rate, regular rhythm. Grossly normal heart sounds.  2+ radial pulses bilaterally. Respiratory: Normal respiratory effort.  No retractions. Lungs  CTAB. Gastrointestinal: Soft and diffusely tender to palpation. No distention.  Rectal exam with external hemorrhoid noted, no fecal impaction. Musculoskeletal: No lower extremity tenderness nor edema.  Neurologic:  Normal speech and language. No gross focal neurologic deficits are appreciated.    ED Results / Procedures / Treatments   Labs (all labs ordered are listed, but only abnormal results are displayed) Labs Reviewed  COMPREHENSIVE METABOLIC PANEL - Abnormal; Notable for the following components:      Result Value   Glucose, Bld 112 (*)    All other components within normal limits  CBC - Abnormal; Notable for the following components:   Hemoglobin 10.5 (*)    HCT 35.4 (*)    MCH 24.8 (*)    MCHC 29.7 (*)    All other components within normal limits  URINALYSIS, ROUTINE W REFLEX MICROSCOPIC - Abnormal; Notable for the following components:   Color, Urine STRAW (*)    APPearance CLEAR (*)    All other components within normal limits  LIPASE, BLOOD     EKG  ED ECG REPORT I, Chesley Noon, the attending physician, personally viewed and interpreted this ECG.   Date: 03/23/2023  EKG Time: 12:49  Rate: 61  Rhythm: normal sinus rhythm  Axis: Normal  Intervals:right bundle branch block  ST&T Change: None  RADIOLOGY CT abdomen/pelvis reviewed and interpreted by me with no inflammatory changes, focal fluid collections, or dilated bowel loops.  PROCEDURES:  Critical Care performed: No  Procedures   MEDICATIONS ORDERED IN ED: Medications  morphine (PF) 4 MG/ML injection 4 mg (4 mg Intravenous Given 03/23/23 1320)  ondansetron (ZOFRAN) injection 4 mg (4 mg Intravenous Given 03/23/23 1320)  lactated ringers bolus 1,000 mL (1,000 mLs Intravenous New Bag/Given 03/23/23 1320)  iohexol (OMNIPAQUE) 300 MG/ML solution 100 mL (100 mLs Intravenous Contrast Given 03/23/23 1346)  morphine (PF) 4 MG/ML injection 4 mg (4 mg Intravenous Given 03/23/23 1736)  oxyCODONE-acetaminophen  (PERCOCET/ROXICET) 5-325 MG per tablet 1 tablet (1 tablet Oral Given 03/23/23 1812)     IMPRESSION / MDM / ASSESSMENT AND PLAN / ED COURSE  I reviewed the triage vital signs and the nursing notes.                              82 y.o. female with past medical history of hypertension, diabetes, rheumatoid arthritis, diastolic CHF, and fibromyalgia who presents to the ED with increasing abdominal pain, nausea, constipation, and dysuria over the past week.  Patient's presentation is most consistent with acute presentation with potential threat to life or bodily function.  Differential diagnosis includes, but is not limited to, bowel obstruction, diverticulitis, appendicitis, cholecystitis, biliary colic, kidney stone, cystitis, pyelonephritis, AKI, electrolyte abnormality, constipation.  Patient uncomfortable appearing but in no acute distress, vital signs are unremarkable.  She is diffusely tender on abdominal exam, rectal exam shows external hemorrhoid with no evidence of bleeding and no fecal impaction.  Given her abdominal discomfort, will further assess with CT imaging of her abdomen/pelvis.  Labs are reassuring with no significant anemia, leukocytosis, tract abnormality, or AKI.  LFTs and lipase are unremarkable, urinalysis is pending at this time.  We will treat symptomatically with IV morphine and Zofran, hydrate with IV fluids.  Bladder scan shows less than 50 cc of urine, doubt significant urinary retention.  CT imaging is unremarkable, urinalysis shows no signs of infection.  Patient is appropriate for outpatient management of abdominal pain likely secondary to constipation.  Patient has been counseled on over-the-counter management of constipation, counseled to have her follow-up with her PCP and to return to the ED for new or worsening symptoms.  Patient and husband agree with plan.      FINAL CLINICAL IMPRESSION(S) / ED DIAGNOSES   Final diagnoses:  Generalized abdominal pain   Constipation, unspecified constipation type     Rx / DC Orders   ED Discharge Orders     None        Note:  This document was prepared using Dragon voice recognition software and may include unintentional dictation errors.   Chesley Noon, MD 03/23/23 [phone]

## 2023-03-23 NOTE — Discharge Instructions (Signed)

## 2023-03-23 NOTE — ED Notes (Signed)
Arm was repositioned on a towel to allow the fluids to infuse.

## 2023-03-23 NOTE — ED Triage Notes (Signed)
Pt to ED via POV from home. Pt reports constipation and problems with hemorrhoids. Pt reports has only been able to pass small BM that is yellow. Pt also reports hemorrhoids have gotten more painful.

## 2023-03-23 NOTE — ED Notes (Signed)
Pt brought to bathroom after pt stated she had to urinate. Pt then unable to urinate once sitting on toilet. Pt back in bed at this time.

## 2023-03-26 ENCOUNTER — Telehealth: Payer: Self-pay

## 2023-03-26 NOTE — Progress Notes (Signed)
Date:  04/01/2023   ID:  [name], DOB 1941/09/01, MRN [id number]  Patient Location:  [address] [place] [address]   Provider location:   [facility],  office  PCP:  Orson Eva, NP  Cardiologist:  Fonnie Mu  Chief Complaint  Patient presents with   6 month follow up     Patient c/o chest pain at times but mostly under left arm. Medications reviewed by the patient verbally.     History of Present Illness:    BAYLEA MILBURN is a 82 y.o. female  past medical history of CAD  Aortic stenosis s/p TAVR in 08/2015 complicated by complete heart block s/p PPM complicated by hemothorax requiring chest tube.  Hypercholesteremia TIA CVA Chronic diastolic congestive heart failure Syncope Cardiac catheterization 2016 with 70% mid LAD disease Frequent UTI Who presents for follow-up of her aortic valve stenosis, pacemaker, diastolic CHF  Last seen in clinic by myself  May 2023 Seen by EP June 2022  In the ER 03/30/23 Abdominal Pain and Urinary Frequency, urine retention 1100 cc UA for urinary tract infection, culture pending, now with foley, on ABX Has follow up with urology  Last pacer download February 2024, no significant arrhythmia Relatively debilitated, walks with a walker, no recent falls  Denies significant chest pain concerning for angina  EKG personally reviewed by myself on todays visit Normal sinus rhythm rate 63 bpm right bundle branch block  Other past medical history reviewed Seen in the hospital April 2023 for chest pain, records reviewed It was felt to be more musculoskeletal, cardiac work-up negative Was started on isosorbide 15 mg daily  Echocardiogram performed and discussed with her on today's visit  1. Left ventricular ejection fraction, by estimation, is 55%. The left  ventricle has normal function. The left ventricle demonstrates regional  wall motion abnormalities (septal wall motion abnormality  likely secondary  to conduction abnormality). Left  ventricular diastolic parameters are consistent with Grade I diastolic  dysfunction (impaired relaxation).   2. Right ventricular systolic function is normal. The right ventricular  size is normal.   3. The mitral valve is normal in structure. No evidence of mitral valve  regurgitation. No evidence of mitral stenosis.   4. The aortic valve has been repaired/replaced. Aortic valve  regurgitation is not visualized. No aortic stenosis is present. Aortic  valve area, by VTI measures 2.39 cm. Aortic valve mean gradient measures  4.0 mmHg.   5. The inferior vena cava is normal in size with greater than 50%  respiratory variability, suggesting right atrial pressure of 3 mmHg.   Chest wall pain comes and goes, periodically takes nitro but nitro was outdated Dizzy spells when going to bathroom, when standing  Cardiac 08/2015 reviewed on today's visit 1. Single-vessel coronary artery disease with heavy calcification of the entire LAD and a moderate eccentric mid LAD stenosis, estimated at 70%  Other past medical history reviewed Prior history of chronic nausea Symptoms worse after eating, previously tried on nausea medications  polypharmacy   at 1 point was on multiple psychotropic/sedating medications.  Off lipitor, stopped on her own, "read bad stuff"   cath in 08/2015 showed single vessel CAD with heavy calcification of the entire LAD and a moderate eccentric mid LAD stenosis estimated at 70%.   known severe aortic stenosis Medical management was advised for her CAD  TAVR in 08/2015 which was complicated by complete heart block requiring PPM. Her PPM implantation was  complicated by a hemothorax requiring chest tube placement.    Echo from 03/2017 showed and EF of 60-65%, no RWMA, Gr1DD, normal functioning aortic valve replacement without stenosis or regurgitation.   Past Medical History:  Diagnosis Date   Acute colitis    Anemia    CAD  (coronary artery disease)    a. heavy calcification of the entire LAD & mod eccentric mid LAD stenosis, estimated @ 70%, mild nonobs stenosis of RCA and LCx, severely calcified Ao valve with restricted valve mobility and 2+ AI     Cancer (HCC) 1981   breast   CHB (complete heart block) (HCC) 08/31/2015   [employer] Ass[employer] DR  model [id number] (serial number  [id number] )    Chronic diastolic congestive heart failure (HCC)    a. echo 08/31/2015: EF 65-70%, nl WM, GR1DD, Ao valve stent bioprosthesis was present and functioning nl, no regurg, LA mildly dilated, PASP 46 mm Hg   Collagen vascular disease (HCC)    Colon polyps    Depression    Diabetes mellitus type II    Fibromyalgia    HTN (hypertension)    Hypercholesteremia    Hypothyroidism    IBS (irritable bowel syndrome)    Presence of permanent cardiac pacemaker    Rectal bleeding    Rheumatoid arthritis(714.0)    S/P TAVR (transcatheter aortic valve replacement) 08/30/2015   26 mm Edwards Sapien 3 transcatheter heart valve placed via open right transfemoral approach   Shortness of breath dyspnea    Stenosis of aortic valve    Suicide attempt (HCC)    Thyroid disease    Past Surgical History:  Procedure Laterality Date   ABDOMINAL SURGERY     Intestinal surgery   CARDIAC SURGERY     CESAREAN SECTION     x 2   COLONOSCOPY WITH PROPOFOL N/A 07/25/2022   Procedure: COLONOSCOPY WITH PROPOFOL;  Surgeon: Regis Bill, MD;  Location: [facility] ENDOSCOPY;  Service: Endoscopy;  Laterality: N/A;   EP IMPLANTABLE DEVICE N/A 08/31/2015   Procedure: Pacemaker Implant;  Surgeon: Will Jorja Loa, MD; [facility] Assurity DR  model [id number] (serial number  [id number] ); Laterality: Right   ESOPHAGOGASTRODUODENOSCOPY (EGD) WITH PROPOFOL N/A 04/04/2021   Procedure: ESOPHAGOGASTRODUODENOSCOPY (EGD) WITH PROPOFOL;  Surgeon: Wyline Mood, MD;  Location: [facility] ENDOSCOPY;  Service: Gastroenterology;  Laterality: N/A;   LAPAROSCOPY N/A 01/31/2020    Procedure: LAPAROSCOPY DIAGNOSTIC;  Surgeon: Sheliah Hatch De Blanch, MD;  Location[facility] OR;  Service: General;  Laterality: N/A;   LAPAROTOMY N/A 01/31/2020   Procedure: Exploratory Laparotomy;  Surgeon: Sheliah Hatch De Blanch, MD;  Location: [facility] OR;  Service: General;  Laterality: N/A;   LEFT OOPHORECTOMY     LYSIS OF ADHESION  01/31/2020   Procedure: Lysis Of Adhesions, Ligation of Falciform Vein;  Surgeon: Rodman Pickle, MD;  Location: [facility] OR;  Service: General;;   MASTECTOMY Left    polyp      self inflicted chest wound     SPINE SURGERY  1981   lower back   TEE WITHOUT CARDIOVERSION N/A 08/30/2015   Procedure: TRANSESOPHAGEAL ECHOCARDIOGRAM (TEE);  Surgeon: Tonny Bollman, MD;  Location: [facility] OR;  Service: Open Heart Surgery;  Laterality: N/A;   TOTAL ABDOMINAL HYSTERECTOMY     TRANSCATHETER AORTIC VALVE REPLACEMENT, TRANSFEMORAL N/A 08/30/2015   Procedure: TRANSCATHETER AORTIC VALVE REPLACEMENT, TRANSFEMORAL;  Surgeon: Tonny Bollman, MD;  Location: [facility] OR;  Service: Open Heart Surgery;  Laterality: N/A;     Current Meds  Medication Sig   acetaminophen (TYLENOL) 325 MG tablet Take 2 tablets (650 mg total) by mouth every 4 (four) hours as needed.   aspirin EC 81 MG tablet Take 1 tablet (81 mg total) by mouth daily. Swallow whole. Hold this medication until you see your PCP and/or cardiologist   atorvastatin (LIPITOR) 40 MG tablet TAKE 1 TABLET BY MOUTH IN THE  EVENING FOR HIGH CHOLESTEROL   cephALEXin (KEFLEX) 250 MG capsule Take 1 capsule (250 mg total) by mouth 3 (three) times daily.   clonazePAM (KLONOPIN) 0.5 MG tablet Take 1 tablet (0.5 mg total) by mouth at bedtime.   famotidine (PEPCID) 40 MG tablet Take 40 mg by mouth daily.   levothyroxine (SYNTHROID) 112 MCG tablet Take 112 mcg by mouth daily.   losartan (COZAAR) 25 MG tablet Take 1/2 tablet (12.5 mg total) by mouth daily.   metoprolol tartrate (LOPRESSOR) 25 MG tablet Take 1/2 tablet (12.5 mg total) by mouth 2 (two) times  daily.   Multiple Vitamin (MULTIVITAMIN WITH MINERALS) TABS tablet Take 1 tablet by mouth daily.   nitroGLYCERIN (NITROSTAT) 0.4 MG SL tablet Place 1 tablet (0.4 mg total) under the tongue every 5 (five) minutes as needed for chest pain.   oxyCODONE (ROXICODONE) 5 MG immediate release tablet Take 1 tablet (5 mg total) by mouth every 8 (eight) hours as needed for severe pain.   pantoprazole (PROTONIX) 40 MG tablet Take 40 mg by mouth daily.   polyethylene glycol powder (GLYCOLAX/MIRALAX) 17 GM/SCOOP powder Take 17 g by mouth daily.   potassium chloride (KLOR-CON M) 10 MEQ tablet TAKE 1 TABLET BY MOUTH EVERY  OTHER DAY   sertraline (ZOLOFT) 100 MG tablet Take 1 tablet (100 mg total) by mouth daily.   traZODone (DESYREL) 50 MG tablet Take 1 tablet (50 mg total) by mouth at bedtime.   Zinc Oxide (TRIPLE PASTE) 12.8 % ointment Apply topically 4 (four) times daily.   [DISCONTINUED] isosorbide mononitrate (IMDUR) 30 MG 24 hr tablet Take 0.5 tablets (15 mg total) by mouth daily. Please keep appointment on 04/01/23 for further refills. Thank you.     Allergies:   Tetracyclines & related, Sulfa antibiotics, and Latex   Social History   Tobacco Use   Smoking status: Never   Smokeless tobacco: Never  Vaping Use   Vaping Use: Never used  Substance Use Topics   Alcohol use: No    Alcohol/week: 0.0 standard drinks of alcohol   Drug use: No     Family Hx: The patient's family history includes Depression in her sister, sister, and sister; Hypertension in her mother and sister; Lung cancer in her sister; Pneumonia in her brother; Stroke in her sister. There is no history of Heart attack.  ROS:   Please see the history of present illness.    Review of Systems  Constitutional: Negative.   HENT: Negative.    Respiratory: Negative.    Cardiovascular: Negative.   Gastrointestinal: Negative.   Musculoskeletal: Negative.        Gait instability, weakness  Neurological: Negative.    Psychiatric/Behavioral: Negative.    All other systems reviewed and are negative.   Labs/Other Tests and Data Reviewed:    Recent Labs: 06/11/2022: B Natriuretic Peptide 101.5 07/08/2022: TSH 0.048 07/12/2022: Magnesium 2.1 03/30/2023: ALT 8; BUN 20; Creatinine, Ser 0.75; Hemoglobin 10.7; Platelets 288; Potassium 4.3; Sodium 135   Recent Lipid Panel Lab Results  Component Value Date/Time   CHOL 180 07/08/2022 12:11 PM   TRIG 105 07/08/2022  12:11 PM   HDL 47 07/08/2022 12:11 PM   CHOLHDL 3.8 07/08/2022 12:11 PM   LDLCALC 112 (H) 07/08/2022 12:11 PM    Wt Readings from Last 3 Encounters:  04/01/23 133 lb (60.3 kg)  03/30/23 135 lb (61.2 kg)  02/23/23 135 lb (61.2 kg)     Exam:    Vital Signs: Vital signs may also be detailed in the HPI BP (!) 140/48 (BP Location: Left Arm, Patient Position: Sitting, Cuff Size: Normal)   Pulse 63   Ht 5\' 2"  (1.575 m)   Wt 133 lb (60.3 kg)   SpO2 98%   BMI 24.33 kg/m  Constitutional:  oriented to person, place, and time. No distress.  HENT:  Head: Grossly normal Eyes:  no discharge. No scleral icterus.  Neck: No JVD, no carotid bruits  Cardiovascular: Regular rate and rhythm, no murmurs appreciated Pulmonary/Chest: Clear to auscultation bilaterally, no wheezes or rails Abdominal: Soft.  no distension.  no tenderness.  Musculoskeletal: Normal range of motion Neurological:  normal muscle tone. Coordination normal. No atrophy Skin: Skin warm and dry Psychiatric: normal affect, pleasant  ASSESSMENT & PLAN:    1. Chronic diastolic congestive heart failure (HCC) Appears euvolemic, no medication changes made On losartan and metoprolol, isosorbide   2. Complete heart block (HCC) Has pacemaker, Followed by EP  3. Hyperlipidemia, unspecified hyperlipidemia type On Lipitor Lab work done through primary care   4. Hypertension, unspecified type Blood pressure is well controlled on today's visit. No changes made to the medications.   5.  Aortic valve stenosis, etiology of cardiac valve disease unspecified S/p TAVR, consider repeat echo in 1 year   6.  Coronary artery disease with stable angina Denies significant chest pain concerning for angina Continue statin, add Zetia    Total encounter time more than 30 minutes  Greater than 50% was spent in counseling and coordination of care with the patient    Signed, Julien Nordmann, MD  04/01/2023 9:11 AM    [facility] [address], [place], [place] [address]

## 2023-03-26 NOTE — Telephone Encounter (Signed)
ED Discharge  Lonon,Ameria C 81 years, Female  DOB: 1940-12-15  __________________________________________________  ED Details Emergency Room Discharge Details  ED Admit Date:: 03/23/2023  ED Discharge Date:: 03/23/2023  ICD Code/Description:: K59 : Constipation, unspecified R10 : Generalized abdominal pain  Treatment Location:: [facility]  Call Outreach Attempt #1#1  Date:: 03/25/2023  Time: AM  Outcome:: Unsuccessful - Left message  Attempt #2#2  Date:: 03/25/2023  Time: PM  Outcome:: Unsuccessful - No answer  Attempt #3#3  Date:: 03/26/2023  Time: AM  Outcome:: Unsuccessful - No answer  Details: No answer, no VM at all numbers in EMR  Is there a longitudinal CRN on the team that has seen the patient since being enrolled?: No  Engagement Notes  Lynann Bologna on 03/25/2023 10:52 AM Please schedule a F/U appt when calling for the ED discharge protocol. I don't see one on EPIC. Thank you  [facility], LPN worked on ED visit protocol, reviewed emergency room discharge with(out) patient and changes updated in clinic EMR. Forwarded to CP for completion. Uploaded ED Discharge Protocol to EMR.  Other Patient Time:  ED discharge: 5 minutes (03/26/23)  Reviewed - 2 mins -Lynann Bologna, PharmD

## 2023-03-30 ENCOUNTER — Encounter: Payer: Self-pay | Admitting: Intensive Care

## 2023-03-30 ENCOUNTER — Other Ambulatory Visit: Payer: Self-pay

## 2023-03-30 ENCOUNTER — Emergency Department
Admission: EM | Admit: 2023-03-30 | Discharge: 2023-03-31 | Disposition: A | Discharge status: Home / Self Care | Payer: Medicare Other | Attending: Emergency Medicine | Admitting: Emergency Medicine

## 2023-03-30 ENCOUNTER — Other Ambulatory Visit: Payer: Self-pay | Admitting: Nurse Practitioner

## 2023-03-30 DIAGNOSIS — Z91040 Latex allergy status: Secondary | ICD-10-CM | POA: Insufficient documentation

## 2023-03-30 DIAGNOSIS — R1032 Left lower quadrant pain: Secondary | ICD-10-CM | POA: Diagnosis not present

## 2023-03-30 DIAGNOSIS — E119 Type 2 diabetes mellitus without complications: Secondary | ICD-10-CM | POA: Diagnosis not present

## 2023-03-30 DIAGNOSIS — J45909 Unspecified asthma, uncomplicated: Secondary | ICD-10-CM | POA: Diagnosis not present

## 2023-03-30 DIAGNOSIS — I700 Atherosclerosis of aorta: Secondary | ICD-10-CM | POA: Diagnosis not present

## 2023-03-30 DIAGNOSIS — I5032 Chronic diastolic (congestive) heart failure: Secondary | ICD-10-CM | POA: Insufficient documentation

## 2023-03-30 DIAGNOSIS — Z7902 Long term (current) use of antithrombotics/antiplatelets: Secondary | ICD-10-CM | POA: Diagnosis not present

## 2023-03-30 DIAGNOSIS — N1330 Unspecified hydronephrosis: Secondary | ICD-10-CM | POA: Diagnosis not present

## 2023-03-30 DIAGNOSIS — R339 Retention of urine, unspecified: Secondary | ICD-10-CM | POA: Insufficient documentation

## 2023-03-30 DIAGNOSIS — I10 Essential (primary) hypertension: Secondary | ICD-10-CM | POA: Diagnosis not present

## 2023-03-30 DIAGNOSIS — Z853 Personal history of malignant neoplasm of breast: Secondary | ICD-10-CM | POA: Diagnosis not present

## 2023-03-30 DIAGNOSIS — I110 Hypertensive heart disease with heart failure: Secondary | ICD-10-CM | POA: Diagnosis not present

## 2023-03-30 DIAGNOSIS — E039 Hypothyroidism, unspecified: Secondary | ICD-10-CM | POA: Insufficient documentation

## 2023-03-30 DIAGNOSIS — N390 Urinary tract infection, site not specified: Secondary | ICD-10-CM | POA: Diagnosis not present

## 2023-03-30 DIAGNOSIS — I2510 Atherosclerotic heart disease of native coronary artery without angina pectoris: Secondary | ICD-10-CM | POA: Diagnosis not present

## 2023-03-30 DIAGNOSIS — Z79899 Other long term (current) drug therapy: Secondary | ICD-10-CM | POA: Diagnosis not present

## 2023-03-30 DIAGNOSIS — Z7982 Long term (current) use of aspirin: Secondary | ICD-10-CM | POA: Diagnosis not present

## 2023-03-30 DIAGNOSIS — R109 Unspecified abdominal pain: Secondary | ICD-10-CM | POA: Diagnosis not present

## 2023-03-30 LAB — URINALYSIS, ROUTINE W REFLEX MICROSCOPIC
Bilirubin Urine: NEGATIVE
Glucose, UA: NEGATIVE mg/dL
Hgb urine dipstick: NEGATIVE
Ketones, ur: NEGATIVE mg/dL
Nitrite: NEGATIVE
Protein, ur: NEGATIVE mg/dL
Specific Gravity, Urine: 1.009 (ref 1.005–1.030)
pH: 7 (ref 5.0–8.0)

## 2023-03-30 LAB — LIPASE, BLOOD: Lipase: 29 U/L (ref 11–51)

## 2023-03-30 LAB — COMPREHENSIVE METABOLIC PANEL
ALT: 8 U/L (ref 0–44)
AST: 15 U/L (ref 15–41)
Albumin: 4.1 g/dL (ref 3.5–5.0)
Alkaline Phosphatase: 48 U/L (ref 38–126)
Anion gap: 11 (ref 5–15)
BUN: 20 mg/dL (ref 8–23)
CO2: 24 mmol/L (ref 22–32)
Calcium: 9.4 mg/dL (ref 8.9–10.3)
Chloride: 100 mmol/L (ref 98–111)
Creatinine, Ser: 0.75 mg/dL (ref 0.44–1.00)
GFR, Estimated: >60 mL/min (ref >60)
Glucose, Bld: 95 mg/dL (ref 70–99)
Potassium: 4.3 mmol/L (ref 3.5–5.1)
Sodium: 135 mmol/L (ref 135–145)
Total Bilirubin: 0.8 mg/dL (ref 0.3–1.2)
Total Protein: 7.1 g/dL (ref 6.5–8.1)

## 2023-03-30 LAB — CBC
HCT: 35.6 % — ABNORMAL LOW (ref 36.0–46.0)
Hemoglobin: 10.7 g/dL — ABNORMAL LOW (ref 12.0–15.0)
MCH: 24.7 pg — ABNORMAL LOW (ref 26.0–34.0)
MCHC: 30.1 g/dL (ref 30.0–36.0)
MCV: 82.2 fL (ref 80.0–100.0)
Platelets: 288 10*3/uL (ref 150–400)
RBC: 4.33 MIL/uL (ref 3.87–5.11)
RDW: 15.5 % (ref 11.5–15.5)
WBC: 7.6 10*3/uL (ref 4.0–10.5)
nRBC: 0 % (ref 0.0–0.2)

## 2023-03-30 MED ORDER — MORPHINE SULFATE (PF) 2 MG/ML IV SOLN
2.0000 mg | Freq: Once | INTRAVENOUS | Status: AC
  Administered 2023-03-30: 2 mg via INTRAVENOUS
  Filled 2023-03-30: qty 1

## 2023-03-30 MED ORDER — SODIUM CHLORIDE 0.9 % IV SOLN
1.0000 g | Freq: Once | INTRAVENOUS | Status: AC
  Administered 2023-03-31: 1 g via INTRAVENOUS
  Filled 2023-03-30: qty 10

## 2023-03-30 MED ORDER — DIPHENHYDRAMINE HCL 50 MG/ML IJ SOLN
12.5000 mg | Freq: Once | INTRAMUSCULAR | Status: AC
  Administered 2023-03-30: 12.5 mg via INTRAVENOUS
  Filled 2023-03-30: qty 1

## 2023-03-30 MED ORDER — SODIUM CHLORIDE 0.9 % IV BOLUS
500.0000 mL | Freq: Once | INTRAVENOUS | Status: AC
  Administered 2023-03-30: 500 mL via INTRAVENOUS

## 2023-03-30 NOTE — ED Triage Notes (Signed)
Pt c/o frequent urination and burning during urination. C/o abdominal pain and lower back pain.
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
## 2023-03-31 ENCOUNTER — Other Ambulatory Visit: Payer: Self-pay | Admitting: Nurse Practitioner

## 2023-03-31 ENCOUNTER — Emergency Department: Payer: Medicare Other

## 2023-03-31 DIAGNOSIS — R109 Unspecified abdominal pain: Secondary | ICD-10-CM | POA: Diagnosis not present

## 2023-03-31 DIAGNOSIS — I700 Atherosclerosis of aorta: Secondary | ICD-10-CM | POA: Diagnosis not present

## 2023-03-31 DIAGNOSIS — N1330 Unspecified hydronephrosis: Secondary | ICD-10-CM | POA: Diagnosis not present

## 2023-03-31 MED ORDER — KETOROLAC TROMETHAMINE 30 MG/ML IJ SOLN
15.0000 mg | Freq: Once | INTRAMUSCULAR | Status: AC
  Administered 2023-03-31: 15 mg via INTRAVENOUS
  Filled 2023-03-31: qty 1

## 2023-03-31 MED ORDER — CLONAZEPAM 0.5 MG PO TABS
0.5000 mg | ORAL_TABLET | Freq: Once | ORAL | Status: AC
  Administered 2023-03-31: 0.5 mg via ORAL
  Filled 2023-03-31: qty 1

## 2023-03-31 MED ORDER — CEPHALEXIN 250 MG PO CAPS: 250.0000 mg | ORAL_CAPSULE | Freq: Three times a day (TID) | ORAL | 0 refills | Status: DC

## 2023-03-31 MED ORDER — IOHEXOL 300 MG/ML  SOLN
100.0000 mL | Freq: Once | INTRAMUSCULAR | Status: AC | PRN
  Administered 2023-03-31: 100 mL via INTRAVENOUS

## 2023-03-31 NOTE — Discharge Instructions (Signed)
1.  Take and finish antibiotic as prescribed (Keflex 250mg  3 times daily x 7 days). 2.  Keep bladder catheter in place until seen by the urologist. 3.  Drink plenty of fluids daily. 4.  Return to the ER for worsening symptoms, persistent vomiting, difficulty breathing or other concerns.

## 2023-04-01 ENCOUNTER — Telehealth: Payer: Self-pay | Admitting: Urology

## 2023-04-01 ENCOUNTER — Encounter: Payer: Self-pay | Admitting: Cardiovascular Disease

## 2023-04-01 ENCOUNTER — Ambulatory Visit: Payer: Medicare Other | Attending: Cardiovascular Disease | Admitting: Cardiovascular Disease

## 2023-04-01 VITALS — BP 140/48 | HR 63 | Ht 62.0 in | Wt 133.0 lb

## 2023-04-01 DIAGNOSIS — Z950 Presence of cardiac pacemaker: Secondary | ICD-10-CM | POA: Diagnosis not present

## 2023-04-01 DIAGNOSIS — Z953 Presence of xenogenic heart valve: Secondary | ICD-10-CM

## 2023-04-01 DIAGNOSIS — I25118 Atherosclerotic heart disease of native coronary artery with other forms of angina pectoris: Secondary | ICD-10-CM

## 2023-04-01 DIAGNOSIS — I10 Essential (primary) hypertension: Secondary | ICD-10-CM | POA: Diagnosis not present

## 2023-04-01 DIAGNOSIS — I442 Atrioventricular block, complete: Secondary | ICD-10-CM

## 2023-04-01 DIAGNOSIS — I350 Nonrheumatic aortic (valve) stenosis: Secondary | ICD-10-CM

## 2023-04-01 DIAGNOSIS — R079 Chest pain, unspecified: Secondary | ICD-10-CM | POA: Diagnosis not present

## 2023-04-01 DIAGNOSIS — I5032 Chronic diastolic (congestive) heart failure: Secondary | ICD-10-CM

## 2023-04-01 LAB — URINE CULTURE: Culture: 10000 — AB

## 2023-04-01 MED ORDER — ISOSORBIDE MONONITRATE ER 30 MG PO TB24: 15.0000 mg | ORAL_TABLET | Freq: Every day | ORAL | 3 refills | Status: DC

## 2023-04-01 MED ORDER — EZETIMIBE 10 MG PO TABS
10.0000 mg | ORAL_TABLET | Freq: Every day | ORAL | 3 refills | Status: DC
Start: 2023-04-01 — End: 2024-05-27

## 2023-04-01 NOTE — Telephone Encounter (Signed)
I have actually never seen this patient.  She has seen Dr. Lonna Cobb in the past.  I would look on his schedule.  Vanna Scotland, MD

## 2023-04-01 NOTE — Telephone Encounter (Signed)
Patient was seen in ER on 4/27 and had Foley placed. Patient established care with Dr. Apolinar Junes, and was last seen by [facility] on 01/06/20. I wasn't able to find a morning appointment with you before the end of May. Can you find an A.M. spot for her, or can she be scheduled with another provider?

## 2023-04-01 NOTE — Patient Instructions (Addendum)
Medication Instructions:  Please start zetia 10 mg daily for cholesterol  If you need a refill on your cardiac medications before your next appointment, please call your pharmacy.   Lab work: No new labs needed  Testing/Procedures: No new testing needed  Follow-Up: At [facility], you and your health needs are our priority.  As part of our continuing mission to provide you with exceptional heart care, we have created designated Provider Care Teams.  These Care Teams include your primary Cardiologist (physician) and Advanced Practice Providers (APPs -  Physician Assistants and Nurse Practitioners) who all work together to provide you with the care you need, when you need it.  You will need a follow up appointment in 6 months  Providers on your designated Care Team:   Nicolasa Ducking, NP Eula Listen, PA-C Cadence Fransico Michael, [place]  COVID-19 Vaccine Information can be found at: [email or web address] For questions related to vaccine distribution or appointments, please email [email or web address] or call 9133333587.

## 2023-04-09 ENCOUNTER — Telehealth: Payer: Self-pay

## 2023-04-09 NOTE — Telephone Encounter (Signed)
Transition Care Management Unsuccessful Follow-up Telephone Call  Date of discharge and from where:  [place] 4/28  Attempts:  1st Attempt  Reason for unsuccessful TCM follow-up call:  Left voice message   Lenard Forth [facility] Guide, [facility] [phone] [address] [place], [place], [place] [address] Phone: [phone] Email: [email or web address]

## 2023-04-09 NOTE — Telephone Encounter (Signed)
Transition Care Management Unsuccessful Follow-up Telephone Call  Date of discharge and from where:  [place] 4/28  Attempts:  2nd Attempt  Reason for unsuccessful TCM follow-up call:  Left voice message    Lenard Forth [facility] Guide, [facility] [phone] [address] [place], [place], [place] [address] Phone: [phone] Email: [email or web address]

## 2023-04-10 ENCOUNTER — Other Ambulatory Visit (HOSPITAL_COMMUNITY): Payer: Self-pay | Admitting: Psychiatry

## 2023-04-10 DIAGNOSIS — F419 Anxiety disorder, unspecified: Secondary | ICD-10-CM

## 2023-04-10 DIAGNOSIS — F331 Major depressive disorder, recurrent, moderate: Secondary | ICD-10-CM

## 2023-04-11 ENCOUNTER — Ambulatory Visit (INDEPENDENT_AMBULATORY_CARE_PROVIDER_SITE_OTHER): Payer: Medicare Other

## 2023-04-11 DIAGNOSIS — I442 Atrioventricular block, complete: Secondary | ICD-10-CM | POA: Diagnosis not present

## 2023-04-11 LAB — CUP PACEART REMOTE DEVICE CHECK
Battery Remaining Longevity: 36 mo
Battery Remaining Percentage: 29 %
Battery Voltage: 2.92 V
Brady Statistic AP VP Percent: 15 %
Brady Statistic AP VS Percent: 2.8 %
Brady Statistic AS VP Percent: 79 %
Brady Statistic AS VS Percent: 2.1 %
Brady Statistic RA Percent Paced: 18 %
Brady Statistic RV Percent Paced: 95 %
Date Time Interrogation Session: 20240509020017
Implantable Lead Connection Status: 753985
Implantable Lead Connection Status: 753985
Implantable Lead Implant Date: 20160928
Implantable Lead Implant Date: 20160928
Implantable Lead Location: 753859
Implantable Lead Location: 753860
Implantable Pulse Generator Implant Date: 20160928
Lead Channel Impedance Value: 390 Ohm
Lead Channel Impedance Value: 550 Ohm
Lead Channel Pacing Threshold Amplitude: 0.625 V
Lead Channel Pacing Threshold Amplitude: 0.75 V
Lead Channel Pacing Threshold Pulse Width: 0.4 ms
Lead Channel Pacing Threshold Pulse Width: 0.4 ms
Lead Channel Sensing Intrinsic Amplitude: 3.2 mV
Lead Channel Sensing Intrinsic Amplitude: 5.7 mV
Lead Channel Setting Pacing Amplitude: 0.875
Lead Channel Setting Pacing Amplitude: 1.75 V
Lead Channel Setting Pacing Pulse Width: 0.4 ms
Lead Channel Setting Sensing Sensitivity: 2.5 mV
Pulse Gen Model: 2240
Pulse Gen Serial Number: 7779295

## 2023-04-22 NOTE — Progress Notes (Deleted)
Cardiology Office Note Date:  04/22/2023  Patient ID:  Catherine [name], Catherine [name] [birth year], MRN [id number] PCP:  Orson Eva, NP  Cardiologist:  Julien Nordmann, MD Electrophysiologist: Dr. Elberta Fortis >> Lanier Prude, MD  ***refresh   Chief Complaint: 1 year PPM follow-up, past due  History of Present Illness: Catherine [name] is a 82 y.o. female with PMH notable for CHB s/p PPM, CAD, HTN, HFpEF; seen today for Lanier Prude, MD for routine electrophysiology followup.  She was last seen by Dr. Lalla Brothers 05/17/2021 to est care as tx from Dr. Elberta Fortis. She has been seen regularly by Dr. Mariah Milling, last on 03/2023.  *** HF symptoms     Since last being seen in our clinic the patient reports doing ***.  she denies chest pain, palpitations, dyspnea, PND, orthopnea, nausea, vomiting, dizziness, syncope, edema, weight gain, or early satiety.     Device Information: St. Jude dual chamber PPM, imp 08/2015; dx CHB  Past Medical History:  Diagnosis Date   Acute colitis    Anemia    CAD (coronary artery disease)    a. heavy calcification of the entire LAD & mod eccentric mid LAD stenosis, estimated @ 70%, mild nonobs stenosis of RCA and LCx, severely calcified Ao valve with restricted valve mobility and 2+ AI     Cancer (HCC) 1981   breast   CHB (complete heart block) (HCC) 08/31/2015   St Jude [employer] Assurity DR  model [id number] (serial number  [id number] )    Chronic diastolic congestive heart failure (HCC)    a. echo 08/31/2015: EF 65-70%, nl WM, GR1DD, Ao valve stent bioprosthesis was present and functioning nl, no regurg, LA mildly dilated, PASP 46 mm Hg   Collagen vascular disease (HCC)    Colon polyps    Depression    Diabetes mellitus type II    Fibromyalgia    HTN (hypertension)    Hypercholesteremia    Hypothyroidism    IBS (irritable bowel syndrome)    Presence of permanent cardiac pacemaker    Rectal bleeding    Rheumatoid arthritis(714.0)    S/P TAVR (transcatheter aortic  valve replacement) 08/30/2015   26 mm Edwards Sapien 3 transcatheter heart valve placed via open right transfemoral approach   Shortness of breath dyspnea    Stenosis of aortic valve    Suicide attempt (HCC)    Thyroid disease     Past Surgical History:  Procedure Laterality Date   ABDOMINAL SURGERY     Intestinal surgery   CARDIAC SURGERY     CESAREAN SECTION     x 2   COLONOSCOPY WITH PROPOFOL N/A 07/25/2022   Procedure: COLONOSCOPY WITH PROPOFOL;  Surgeon: Regis Bill, MD;  Location: [facility] ENDOSCOPY;  Service: Endoscopy;  Laterality: N/A;   EP IMPLANTABLE DEVICE N/A 08/31/2015   Procedure: Pacemaker Implant;  Surgeon: Will Jorja Loa, MD; [facility] Assurity DR  model [id number] (serial number  [id number] ); Laterality: Right   ESOPHAGOGASTRODUODENOSCOPY (EGD) WITH PROPOFOL N/A 04/04/2021   Procedure: ESOPHAGOGASTRODUODENOSCOPY (EGD) WITH PROPOFOL;  Surgeon: Wyline Mood, MD;  Location: [facility] ENDOSCOPY;  Service: Gastroenterology;  Laterality: N/A;   LAPAROSCOPY N/A 01/31/2020   Procedure: LAPAROSCOPY DIAGNOSTIC;  Surgeon: Sheliah Hatch De Blanch, MD;  Location: [facility] OR;  Service: General;  Laterality: N/A;   LAPAROTOMY N/A 01/31/2020   Procedure: Exploratory Laparotomy;  Surgeon: Sheliah Hatch De Blanch, MD;  Location: [facility] OR;  Service: General;  Laterality: N/A;   LEFT OOPHORECTOMY     LYSIS  OF ADHESION  01/31/2020   Procedure: Lysis Of Adhesions, Ligation of Falciform Vein;  Surgeon: Kinsinger, De Blanch, MD;  Location: [facility] OR;  Service: General;;   MASTECTOMY Left    polyp      self inflicted chest wound     SPINE SURGERY  1981   lower back   TEE WITHOUT CARDIOVERSION N/A 08/30/2015   Procedure: TRANSESOPHAGEAL ECHOCARDIOGRAM (TEE);  Surgeon: Tonny Bollman, MD;  Location: [facility] OR;  Service: Open Heart Surgery;  Laterality: N/A;   TOTAL ABDOMINAL HYSTERECTOMY     TRANSCATHETER AORTIC VALVE REPLACEMENT, TRANSFEMORAL N/A 08/30/2015   Procedure: TRANSCATHETER AORTIC VALVE REPLACEMENT,  TRANSFEMORAL;  Surgeon: Tonny Bollman, MD;  Location: [facility] OR;  Service: Open Heart Surgery;  Laterality: N/A;    Current Outpatient Medications  Medication Instructions   acetaminophen (TYLENOL) 650 mg, Oral, Every 4 hours PRN   aspirin EC 81 mg, Oral, Daily, Swallow whole. Hold this medication until you see your PCP and/or cardiologist   atorvastatin (LIPITOR) 40 MG tablet TAKE 1 TABLET BY MOUTH IN THE  EVENING FOR HIGH CHOLESTEROL   cephALEXin (KEFLEX) 250 mg, Oral, 3 times daily   clonazePAM (KLONOPIN) 0.5 mg, Oral, Daily at bedtime   ezetimibe (ZETIA) 10 mg, Oral, Daily   famotidine (PEPCID) 40 mg, Oral, Daily   isosorbide mononitrate (IMDUR) 15 mg, Oral, Daily, Please keep appointment on 04/01/23 for further refills. Thank you.   levothyroxine (SYNTHROID) 112 mcg, Oral, Daily   losartan (COZAAR) 25 MG tablet Take 1/2 tablet (12.5 mg total) by mouth daily.   metoprolol tartrate (LOPRESSOR) 25 MG tablet Take 1/2 tablet (12.5 mg total) by mouth 2 (two) times daily.   Multiple Vitamin (MULTIVITAMIN WITH MINERALS) TABS tablet 1 tablet, Oral, Daily   nitroGLYCERIN (NITROSTAT) 0.4 mg, Sublingual, Every 5 min PRN   oxyCODONE (ROXICODONE) 5 mg, Oral, Every 8 hours PRN   pantoprazole (PROTONIX) 40 mg, Oral, Daily   polyethylene glycol powder (GLYCOLAX/MIRALAX) 17 g, Oral, Daily   potassium chloride (KLOR-CON M) 10 MEQ tablet 10 mEq, Oral, Every other day   sertraline (ZOLOFT) 100 mg, Oral, Daily   traZODone (DESYREL) 50 mg, Oral, Daily at bedtime   Zinc Oxide (TRIPLE PASTE) 12.8 % ointment Topical, 4 times daily    Social History:  The patient  reports that she has never smoked. She has never used smokeless tobacco. She reports that she does not drink alcohol and does not use drugs.   Family History:  *** include only if pertinent The patient's family history includes Depression in her sister, sister, and sister; Hypertension in her mother and sister; Lung cancer in her sister; Pneumonia in  her brother; Stroke in her sister.***  ROS:  Please see the history of present illness. All other systems are reviewed and otherwise negative.   PHYSICAL EXAM: *** VS:  There were no vitals taken for this visit. BMI: There is no height or weight on file to calculate BMI.  GEN- The patient is well appearing, alert and oriented x 3 today.   Lungs- Clear to ausculation bilaterally, normal work of breathing.  Heart- {Blank single:19197::"Regular","Irregularly irregular"} rate and rhythm, no murmurs, rubs or gallops Extremities- {EDEMA LEVEL:28147::"No"} peripheral edema, warm, dry Skin-  *** device pocket well-healed, no tethering     Device interrogation done today and reviewed by myself:  Battery *** Lead thresholds, impedence, sensing stable *** *** episodes *** changes made today  EKG is not ordered. Personal review of EKG from  04/01/2023  shows:  NSR, rate 63bpm; RBBB  Recent Labs: 06/11/2022: B Natriuretic Peptide 101.5 07/08/2022: TSH 0.048 07/12/2022: Magnesium 2.1 03/30/2023: ALT 8; BUN 20; Creatinine, Ser 0.75; Hemoglobin 10.7; Platelets 288; Potassium 4.3; Sodium 135  07/08/2022: Cholesterol 180; HDL 47; LDL Cholesterol 112; Total CHOL/HDL Ratio 3.8; Triglycerides 105; VLDL 21   CrCl cannot be calculated (Patient's most recent lab result is older than the maximum 21 days allowed.).   Wt Readings from Last 3 Encounters:  04/01/23 133 lb (60.3 kg)  03/30/23 135 lb (61.2 kg)  02/23/23 135 lb (61.2 kg)     Additional studies reviewed include: Previous EP, cardiology notes.   TTE, 03/08/2022  1. Left ventricular ejection fraction, by estimation, is 55%. The left ventricle has normal function. The left ventricle demonstrates regional wall motion abnormalities (septal wall motion abnormality likely secondary to conduction abnormality). Left ventricular diastolic parameters are consistent with Grade I diastolic dysfunction (impaired relaxation).  2. Right ventricular systolic  function is normal. The right ventricular size is normal.  3. The mitral valve is normal in structure. No evidence of mitral valve regurgitation. No evidence of mitral stenosis.  4. The aortic valve has been repaired/replaced. Aortic valve regurgitation is not visualized. No aortic stenosis is present. Aortic valve area, by VTI measures 2.39 cm. Aortic valve mean gradient measures 4.0 mmHg.  5. The inferior vena cava is normal in size with greater than 50% respiratory variability, suggesting right atrial pressure of 3 mmHg.  ASSESSMENT AND PLAN:  #) CHB s/p St. Jude PPM Device functioning well, see paceart for details   #) HFpEF Not on spiro, no diuertic, no jardiance GDMT: losartan, metop,    #) HTN     Current medicines are reviewed at length with the patient today.   The patient {ACTIONS; HAS/DOES NOT HAVE:19233} concerns regarding her medicines.  The following changes were made today:  {NONE DEFAULTED:18576}  Labs/ tests ordered today include: *** No orders of the defined types were placed in this encounter.    Disposition: Follow up with {EPMDS:28135} in {EPFOLLOW UP:28173}   Signed, Sherie Don, NP  04/22/23  8:47 PM  Electrophysiology [facility]

## 2023-04-23 ENCOUNTER — Ambulatory Visit: Payer: Medicare Other | Admitting: Cardiology

## 2023-04-23 DIAGNOSIS — I10 Essential (primary) hypertension: Secondary | ICD-10-CM

## 2023-04-23 DIAGNOSIS — I442 Atrioventricular block, complete: Secondary | ICD-10-CM

## 2023-04-23 DIAGNOSIS — Z950 Presence of cardiac pacemaker: Secondary | ICD-10-CM

## 2023-04-23 NOTE — Progress Notes (Signed)
Cardiology Office Note Date:  05/02/2023  Patient ID:  Catherine [name], Catherine [name] 1941/05/20, MRN [id number] PCP:  Orson Eva, NP  Cardiologist:  Julien Nordmann, MD Electrophysiologist: Dr. Elberta Fortis >> Lanier Prude, MD    Chief Complaint: 1 year PPM follow-up, past due  History of Present Illness: Catherine [name] is a 82 y.o. female with PMH notable for CHB s/p PPM, CAD, HTN, HFpEF; seen today for Lanier Prude, MD for routine electrophysiology followup.  She was last seen by Dr. Lalla Brothers 05/17/2021 to est care as tx from Dr. Elberta Fortis. She has been seen regularly by Dr. Mariah Milling, last on 03/2023.  No interval updates since last seeing Dr. Mariah Milling.  She has intermittent chest pain after eating, pain is relieved with belching.   .  she denies palpitations, dyspnea, PND, orthopnea, nausea, vomiting, dizziness, syncope, edema, weight gain, or early satiety.     Device Information: St. Jude dual chamber PPM, imp 08/2015; dx CHB  Past Medical History:  Diagnosis Date   Acute colitis    Anemia    CAD (coronary artery disease)    a. heavy calcification of the entire LAD & mod eccentric mid LAD stenosis, estimated @ 70%, mild nonobs stenosis of RCA and LCx, severely calcified Ao valve with restricted valve mobility and 2+ AI     Cancer (HCC) 1981   breast   CHB (complete heart block) (HCC) 08/31/2015   St Jude Medical Assurity DR  model [id number] (serial number  [id number] )    Chronic diastolic congestive heart failure (HCC)    a. echo 08/31/2015: EF 65-70%, nl WM, GR1DD, Ao valve stent bioprosthesis was present and functioning nl, no regurg, LA mildly dilated, PASP 46 mm Hg   Collagen vascular disease (HCC)    Colon polyps    Depression    Diabetes mellitus type II    Fibromyalgia    HTN (hypertension)    Hypercholesteremia    Hypothyroidism    IBS (irritable bowel syndrome)    Presence of permanent cardiac pacemaker    Rectal bleeding    Rheumatoid arthritis(714.0)    S/P TAVR  (transcatheter aortic valve replacement) 08/30/2015   26 mm Edwards Sapien 3 transcatheter heart valve placed via open right transfemoral approach   Shortness of breath dyspnea    Stenosis of aortic valve    Suicide attempt (HCC)    Thyroid disease     Past Surgical History:  Procedure Laterality Date   ABDOMINAL SURGERY     Intestinal surgery   CARDIAC SURGERY     CESAREAN SECTION     x 2   COLONOSCOPY WITH PROPOFOL N/A 07/25/2022   Procedure: COLONOSCOPY WITH PROPOFOL;  Surgeon: Regis Bill, MD;  Location: [facility] ENDOSCOPY;  Service: Endoscopy;  Laterality: N/A;   EP IMPLANTABLE DEVICE N/A 08/31/2015   Procedure: Pacemaker Implant;  Surgeon: Will Jorja Loa, MD; [facility] Assurity DR  model [id number] (serial number  [id number] ); Laterality: Right   ESOPHAGOGASTRODUODENOSCOPY (EGD) WITH PROPOFOL N/A 04/04/2021   Procedure: ESOPHAGOGASTRODUODENOSCOPY (EGD) WITH PROPOFOL;  Surgeon: Wyline Mood, MD;  Location: [facility] ENDOSCOPY;  Service: Gastroenterology;  Laterality: N/A;   LAPAROSCOPY N/A 01/31/2020   Procedure: LAPAROSCOPY DIAGNOSTIC;  Surgeon: Sheliah Hatch De Blanch, MD;  Location: [facility] OR;  Service: General;  Laterality: N/A;   LAPAROTOMY N/A 01/31/2020   Procedure: Exploratory Laparotomy;  Surgeon: Sheliah Hatch De Blanch, MD;  Location: [facility] OR;  Service: General;  Laterality: N/A;   LEFT OOPHORECTOMY  LYSIS OF ADHESION  01/31/2020   Procedure: Lysis Of Adhesions, Ligation of Falciform Vein;  Surgeon: Kinsinger, De Blanch, MD;  Location: [facility] OR;  Service: General;;   MASTECTOMY Left    polyp      self inflicted chest wound     SPINE SURGERY  1981   lower back   TEE WITHOUT CARDIOVERSION N/A 08/30/2015   Procedure: TRANSESOPHAGEAL ECHOCARDIOGRAM (TEE);  Surgeon: Tonny Bollman, MD;  Location: [facility] OR;  Service: Open Heart Surgery;  Laterality: N/A;   TOTAL ABDOMINAL HYSTERECTOMY     TRANSCATHETER AORTIC VALVE REPLACEMENT, TRANSFEMORAL N/A 08/30/2015   Procedure: TRANSCATHETER AORTIC  VALVE REPLACEMENT, TRANSFEMORAL;  Surgeon: Tonny Bollman, MD;  Location: [facility] OR;  Service: Open Heart Surgery;  Laterality: N/A;    Current Outpatient Medications  Medication Instructions   acetaminophen (TYLENOL) 650 mg, Oral, Every 4 hours PRN   aspirin EC 81 mg, Oral, Daily, Swallow whole. Hold this medication until you see your PCP and/or cardiologist   atorvastatin (LIPITOR) 40 MG tablet TAKE 1 TABLET BY MOUTH IN THE  EVENING FOR HIGH CHOLESTEROL   clonazePAM (KLONOPIN) 0.5 mg, Oral, Daily at bedtime   ezetimibe (ZETIA) 10 mg, Oral, Daily   famotidine (PEPCID) 40 mg, Oral, Daily   isosorbide mononitrate (IMDUR) 30 MG 24 hr tablet TAKE 0.5 TABLETS BY MOUTH DAILY. PLEASE KEEP APPOINTMENT ON 04/01/23 FOR FURTHER REFILLS. THANK YOU.   levothyroxine (SYNTHROID) 112 mcg, Oral, Daily   losartan (COZAAR) 25 MG tablet Take 1/2 tablet (12.5 mg total) by mouth daily.   metoprolol tartrate (LOPRESSOR) 25 MG tablet Take 1/2 tablet (12.5 mg total) by mouth 2 (two) times daily.   Multiple Vitamin (MULTIVITAMIN WITH MINERALS) TABS tablet 1 tablet, Oral, Daily   nitroGLYCERIN (NITROSTAT) 0.4 mg, Sublingual, Every 5 min PRN   oxyCODONE (ROXICODONE) 5 mg, Oral, Every 8 hours PRN   pantoprazole (PROTONIX) 40 mg, Oral, Daily   polyethylene glycol powder (GLYCOLAX/MIRALAX) 17 g, Oral, Daily   potassium chloride (KLOR-CON M) 10 MEQ tablet 10 mEq, Oral, Every other day   sertraline (ZOLOFT) 100 mg, Oral, Daily   traZODone (DESYREL) 50 mg, Oral, Daily at bedtime   Zinc Oxide (TRIPLE PASTE) 12.8 % ointment Topical, 4 times daily    Social History:  The patient  reports that she has never smoked. She has never used smokeless tobacco. She reports that she does not drink alcohol and does not use drugs.   Family History:  The patient's family history includes Depression in her sister, sister, and sister; Hypertension in her mother and sister; Lung cancer in her sister; Pneumonia in her brother; Stroke in her  sister.  ROS:  Please see the history of present illness. All other systems are reviewed and otherwise negative.   PHYSICAL EXAM:  VS:  BP (!) 110/50 (BP Location: Right Arm, Patient Position: Sitting, Cuff Size: Normal)   Pulse 76   Ht 5\' 2"  (1.575 m)   Wt 132 lb (59.9 kg)   SpO2 98%   BMI 24.14 kg/m  BMI: Body mass index is 24.14 kg/m.  GEN- The patient is well appearing, alert and oriented x 3 today.   Lungs- Clear to ausculation bilaterally, normal work of breathing.  Heart- Regular rate and rhythm, no murmurs, rubs or gallops Extremities- Trace peripheral edema, warm, dry Skin-  R-sided device pocket well-healed, no tethering     Device interrogation done today and reviewed by myself:  Battery 4 years Lead thresholds, impedence, sensing stable  Brief, rare  SVT episodes, max 10 seconds  Presents in AS-VP Intact conduction. Adjusted AV delay to VPI to promote intrinsic No other changes made today  EKG is not ordered. Personal review of EKG from  04/01/2023  shows:  NSR, rate 63bpm; RBBB  Recent Labs: 06/11/2022: B Natriuretic Peptide 101.5 07/08/2022: TSH 0.048 07/12/2022: Magnesium 2.1 03/30/2023: ALT 8; BUN 20; Creatinine, Ser 0.75; Hemoglobin 10.7; Platelets 288; Potassium 4.3; Sodium 135  07/08/2022: Cholesterol 180; HDL 47; LDL Cholesterol 112; Total CHOL/HDL Ratio 3.8; Triglycerides 105; VLDL 21   CrCl cannot be calculated (Patient's most recent lab result is older than the maximum 21 days allowed.).   Wt Readings from Last 3 Encounters:  05/02/23 132 lb (59.9 kg)  05/02/23 132 lb (59.9 kg)  04/01/23 133 lb (60.3 kg)     Additional studies reviewed include: Previous EP, cardiology notes.   TTE, 03/08/2022  1. Left ventricular ejection fraction, by estimation, is 55%. The left ventricle has normal function. The left ventricle demonstrates regional wall motion abnormalities (septal wall motion abnormality likely secondary to conduction abnormality). Left ventricular  diastolic parameters are consistent with Grade I diastolic dysfunction (impaired relaxation).  2. Right ventricular systolic function is normal. The right ventricular size is normal.  3. The mitral valve is normal in structure. No evidence of mitral valve regurgitation. No evidence of mitral stenosis.  4. The aortic valve has been repaired/replaced. Aortic valve regurgitation is not visualized. No aortic stenosis is present. Aortic valve area, by VTI measures 2.39 cm. Aortic valve mean gradient measures 4.0 mmHg.  5. The inferior vena cava is normal in size with greater than 50% respiratory variability, suggesting right atrial pressure of 3 mmHg.  ASSESSMENT AND PLAN:  #) CHB s/p St. Jude PPM Device functioning well, see paceart for details   #) HFpEF NYHA II-III symptoms Warm and dry on exam Not on spiro, no diuertic, no jardiance GDMT: losartan, metop Ventricular rates well-controlled   #) SVT Very brief episodes by device No symptoms Cont to monitor, if burden increases, consider increasing BB  #) HTN Well-controlled     Current medicines are reviewed at length with the patient today.   The patient does not have concerns regarding her medicines.  The following changes were made today:  none  Labs/ tests ordered today include:  No orders of the defined types were placed in this encounter.    Disposition: Follow up with Dr. Lalla Brothers or EP APP in 12 months   Signed, Sherie Don, NP  05/02/23  1:42 PM  Electrophysiology [facility]

## 2023-04-27 ENCOUNTER — Other Ambulatory Visit: Payer: Self-pay | Admitting: Cardiovascular Disease

## 2023-04-28 ENCOUNTER — Other Ambulatory Visit (HOSPITAL_COMMUNITY): Payer: Self-pay | Admitting: Psychiatry

## 2023-04-28 DIAGNOSIS — F331 Major depressive disorder, recurrent, moderate: Secondary | ICD-10-CM

## 2023-05-01 NOTE — Progress Notes (Signed)
Remote pacemaker transmission.   

## 2023-05-02 ENCOUNTER — Ambulatory Visit (INDEPENDENT_AMBULATORY_CARE_PROVIDER_SITE_OTHER): Payer: Medicare Other | Admitting: Physician Assistant

## 2023-05-02 ENCOUNTER — Encounter: Payer: Self-pay | Admitting: Urology

## 2023-05-02 ENCOUNTER — Ambulatory Visit: Payer: Medicare Other | Admitting: Urology

## 2023-05-02 ENCOUNTER — Ambulatory Visit: Payer: Medicare Other | Attending: Cardiology | Admitting: Cardiology

## 2023-05-02 ENCOUNTER — Encounter: Payer: Self-pay | Admitting: Cardiology

## 2023-05-02 VITALS — BP 110/50 | HR 76 | Ht 62.0 in | Wt 132.0 lb

## 2023-05-02 VITALS — BP 118/65 | HR 97 | Ht 62.0 in | Wt 132.0 lb

## 2023-05-02 DIAGNOSIS — I442 Atrioventricular block, complete: Secondary | ICD-10-CM | POA: Diagnosis not present

## 2023-05-02 DIAGNOSIS — Z950 Presence of cardiac pacemaker: Secondary | ICD-10-CM

## 2023-05-02 DIAGNOSIS — I10 Essential (primary) hypertension: Secondary | ICD-10-CM | POA: Diagnosis not present

## 2023-05-02 DIAGNOSIS — R339 Retention of urine, unspecified: Secondary | ICD-10-CM

## 2023-05-02 DIAGNOSIS — I5032 Chronic diastolic (congestive) heart failure: Secondary | ICD-10-CM

## 2023-05-02 LAB — CUP PACEART INCLINIC DEVICE CHECK
Battery Remaining Longevity: 36 mo
Battery Voltage: 2.92 V
Brady Statistic RA Percent Paced: 19 %
Brady Statistic RV Percent Paced: 94 %
Date Time Interrogation Session: 20240530152253
Implantable Lead Connection Status: 753985
Implantable Lead Connection Status: 753985
Implantable Lead Implant Date: 20160928
Implantable Lead Implant Date: 20160928
Implantable Lead Location: 753859
Implantable Lead Location: 753860
Implantable Pulse Generator Implant Date: 20160928
Lead Channel Impedance Value: 400 Ohm
Lead Channel Impedance Value: 562.5 Ohm
Lead Channel Pacing Threshold Amplitude: 0.75 V
Lead Channel Pacing Threshold Amplitude: 0.75 V
Lead Channel Pacing Threshold Amplitude: 0.75 V
Lead Channel Pacing Threshold Amplitude: 0.75 V
Lead Channel Pacing Threshold Pulse Width: 0.4 ms
Lead Channel Pacing Threshold Pulse Width: 0.4 ms
Lead Channel Pacing Threshold Pulse Width: 0.4 ms
Lead Channel Pacing Threshold Pulse Width: 0.4 ms
Lead Channel Sensing Intrinsic Amplitude: 3.4 mV
Lead Channel Sensing Intrinsic Amplitude: 5.1 mV
Lead Channel Setting Pacing Amplitude: 0.875
Lead Channel Setting Pacing Amplitude: 1.75 V
Lead Channel Setting Pacing Pulse Width: 0.4 ms
Lead Channel Setting Sensing Sensitivity: 2.5 mV
Pulse Gen Model: 2240
Pulse Gen Serial Number: 7779295

## 2023-05-02 LAB — BLADDER SCAN AMB NON-IMAGING: Scan Result: 0 ml

## 2023-05-02 MED ORDER — CIPROFLOXACIN HCL 500 MG PO TABS
500.0000 mg | ORAL_TABLET | Freq: Once | ORAL | Status: DC
Start: 2023-05-02 — End: 2023-05-02

## 2023-05-02 NOTE — Progress Notes (Signed)
Patient returned to clinic this afternoon for PVR. She was able to void twice. No complaints. PVR 0mL.   Results for orders placed or performed in visit on 05/02/23  BLADDER SCAN AMB NON-IMAGING  Result Value Ref Range   Scan Result 0 ml   Voiding trial passed. We discussed avoiding constipation, as this was likely contributory.  Return in about 2 months (around 07/02/2023) for Symptom recheck with PVR.

## 2023-05-02 NOTE — Progress Notes (Signed)
Marcelle Overlie Plume,acting as a scribe for Riki Altes, MD.,have documented all relevant documentation on the behalf of Riki Altes, MD,as directed by  Riki Altes, MD while in the presence of Riki Altes, MD.  05/02/2023 9:10 AM   Catherine [name] Nov 25, 1941 [id number]  Referring provider: Orson Eva, NP [address] [place],  [place] [address]  Chief Complaint  Patient presents with   Urinary Retention    HPI: Catherine [name] is a 82 y.o. female who presents today for ED follow up for urinary retention.  [facility] ED visit 03/30/2023 with complaints of abdominal pain and urinary frequency. Bladder scan showed >800 mL and Foley catheter was placed. Relief of her abdominal pain after Foley catheter insertion.CT performed that visit showed mild bilateral hydronephrosis and a non-obstructing renal calculus Was having constipation and ED visits prior to her episode of retention, which she states has improved Catheter has been indwelling for > 1 month with no previous voiding trial Has previously been followed for overactive bladder and recurrent UTI and last seen in 2021  PMH: Past Medical History:  Diagnosis Date   Acute colitis    Anemia    CAD (coronary artery disease)    a. heavy calcification of the entire LAD & mod eccentric mid LAD stenosis, estimated @ 70%, mild nonobs stenosis of RCA and LCx, severely calcified Ao valve with restricted valve mobility and 2+ AI     Cancer (HCC) 1981   breast   CHB (complete heart block) (HCC) 08/31/2015   [employer] Ass[employer] DR  model [id number] (serial number  [id number] )    Chronic diastolic congestive heart failure (HCC)    a. echo 08/31/2015: EF 65-70%, nl WM, GR1DD, Ao valve stent bioprosthesis was present and functioning nl, no regurg, LA mildly dilated, PASP 46 mm Hg   Collagen vascular disease (HCC)    Colon polyps    Depression    Diabetes mellitus type II    Fibromyalgia    HTN (hypertension)    Hypercholesteremia     Hypothyroidism    IBS (irritable bowel syndrome)    Presence of permanent cardiac pacemaker    Rectal bleeding    Rheumatoid arthritis(714.0)    S/P TAVR (transcatheter aortic valve replacement) 08/30/2015   26 mm Edwards Sapien 3 transcatheter heart valve placed via open right transfemoral approach   Shortness of breath dyspnea    Stenosis of aortic valve    Suicide attempt (HCC)    Thyroid disease     Surgical History: Past Surgical History:  Procedure Laterality Date   ABDOMINAL SURGERY     Intestinal surgery   CARDIAC SURGERY     CESAREAN SECTION     x 2   COLONOSCOPY WITH PROPOFOL N/A 07/25/2022   Procedure: COLONOSCOPY WITH PROPOFOL;  Surgeon: Regis Bill, MD;  Location: [facility] ENDOSCOPY;  Service: Endoscopy;  Laterality: N/A;   EP IMPLANTABLE DEVICE N/A 08/31/2015   Procedure: Pacemaker Implant;  Surgeon: Will Jorja Loa, MD; [facility] Assurity DR  model [id number] (serial number  [id number] ); Laterality: Right   ESOPHAGOGASTRODUODENOSCOPY (EGD) WITH PROPOFOL N/A 04/04/2021   Procedure: ESOPHAGOGASTRODUODENOSCOPY (EGD) WITH PROPOFOL;  Surgeon: Wyline Mood, MD;  Location: [facility] ENDOSCOPY;  Service: Gastroenterology;  Laterality: N/A;   LAPAROSCOPY N/A 01/31/2020   Procedure: LAPAROSCOPY DIAGNOSTIC;  Surgeon: Sheliah Hatch De Blanch, MD;  Location: [facility] OR;  Service: General;  Laterality: N/A;   LAPAROTOMY N/A 01/31/2020   Procedure: Exploratory Laparotomy;  Surgeon: Kinsinger, De Blanch, MD;  Location: [facility] OR;  Service: General;  Laterality: N/A;   LEFT OOPHORECTOMY     LYSIS OF ADHESION  01/31/2020   Procedure: Lysis Of Adhesions, Ligation of Falciform Vein;  Surgeon: Sheliah Hatch De Blanch, MD;  Location: [facility] OR;  Service: General;;   MASTECTOMY Left    polyp      self inflicted chest wound     SPINE SURGERY  1981   lower back   TEE WITHOUT CARDIOVERSION N/A 08/30/2015   Procedure: TRANSESOPHAGEAL ECHOCARDIOGRAM (TEE);  Surgeon: Tonny Bollman, MD;  Location: [facility] OR;   Service: Open Heart Surgery;  Laterality: N/A;   TOTAL ABDOMINAL HYSTERECTOMY     TRANSCATHETER AORTIC VALVE REPLACEMENT, TRANSFEMORAL N/A 08/30/2015   Procedure: TRANSCATHETER AORTIC VALVE REPLACEMENT, TRANSFEMORAL;  Surgeon: Tonny Bollman, MD;  Location: [facility] OR;  Service: Open Heart Surgery;  Laterality: N/A;    Home Medications:  Allergies as of 05/02/2023       Reactions   Tetracyclines & Related Anaphylaxis, Rash   Sulfa Antibiotics Hives   Latex Rash        Medication List        Accurate as of May 02, 2023  9:10 AM. If you have any questions, ask your nurse or doctor.          acetaminophen 325 MG tablet Commonly known as: Tylenol Take 2 tablets (650 mg total) by mouth every 4 (four) hours as needed.   aspirin EC 81 MG tablet Take 1 tablet (81 mg total) by mouth daily. Swallow whole. Hold this medication until you see your PCP and/or cardiologist   atorvastatin 40 MG tablet Commonly known as: LIPITOR TAKE 1 TABLET BY MOUTH IN THE  EVENING FOR HIGH CHOLESTEROL   cephALEXin 250 MG capsule Commonly known as: KEFLEX Take 1 capsule (250 mg total) by mouth 3 (three) times daily.   clonazePAM 0.5 MG tablet Commonly known as: KLONOPIN Take 1 tablet (0.5 mg total) by mouth at bedtime.   ezetimibe 10 MG tablet Commonly known as: ZETIA Take 1 tablet (10 mg total) by mouth daily.   famotidine 40 MG tablet Commonly known as: PEPCID Take 40 mg by mouth daily.   isosorbide mononitrate 30 MG 24 hr tablet Commonly known as: IMDUR TAKE 0.5 TABLETS BY MOUTH DAILY. PLEASE KEEP APPOINTMENT ON 04/01/23 FOR FURTHER REFILLS. THANK YOU.   levothyroxine 112 MCG tablet Commonly known as: SYNTHROID Take 112 mcg by mouth daily.   losartan 25 MG tablet Commonly known as: COZAAR Take 1/2 tablet (12.5 mg total) by mouth daily.   metoprolol tartrate 25 MG tablet Commonly known as: LOPRESSOR Take 1/2 tablet (12.5 mg total) by mouth 2 (two) times daily.   multivitamin with  minerals Tabs tablet Take 1 tablet by mouth daily.   nitroGLYCERIN 0.4 MG SL tablet Commonly known as: NITROSTAT Place 1 tablet (0.4 mg total) under the tongue every 5 (five) minutes as needed for chest pain.   oxyCODONE 5 MG immediate release tablet Commonly known as: Roxicodone Take 1 tablet (5 mg total) by mouth every 8 (eight) hours as needed for severe pain.   pantoprazole 40 MG tablet Commonly known as: PROTONIX Take 40 mg by mouth daily.   polyethylene glycol powder 17 GM/SCOOP powder Commonly known as: GLYCOLAX/MIRALAX Take 17 g by mouth daily.   potassium chloride 10 MEQ tablet Commonly known as: KLOR-CON M TAKE 1 TABLET BY MOUTH EVERY  OTHER DAY   sertraline 100 MG tablet Commonly known  as: ZOLOFT Take 1 tablet (100 mg total) by mouth daily.   traZODone 50 MG tablet Commonly known as: DESYREL Take 1 tablet (50 mg total) by mouth at bedtime.   Zinc Oxide 12.8 % ointment Commonly known as: TRIPLE PASTE Apply topically 4 (four) times daily.        Allergies:  Allergies  Allergen Reactions   Tetracyclines & Related Anaphylaxis and Rash   Sulfa Antibiotics Hives   Latex Rash    Family History: Family History  Problem Relation Age of Onset   Depression Sister    Depression Sister    Depression Sister    Hypertension Mother    Lung cancer Sister    Stroke Sister    Hypertension Sister    Pneumonia Brother    Heart attack Neg Hx     Social History:  reports that she has never smoked. She has never used smokeless tobacco. She reports that she does not drink alcohol and does not use drugs.   Physical Exam: BP 118/65   Pulse 97   Ht 5\' 2"  (1.575 m)   Wt 132 lb (59.9 kg)   BMI 24.14 kg/m   Constitutional:  Alert, No acute distress. HEENT: [place] AT Respiratory: Normal respiratory effort, no increased work of breathing. GU: Foley catheter draining cloudy urine Psychiatric: Normal mood and affect.  Assessment & Plan:    1. Urinary  retention Catheter removed Cipro 500 mg PO prior to catheter removal Scheduled for follow up with Carman Ching PA-C this afternoon for a bladder scan  I have reviewed the above documentation for accuracy and completeness, and I agree with the above.   Riki Altes, MD  [facility] [address], [address] [place], [place] [address] [phone]

## 2023-05-02 NOTE — Progress Notes (Signed)
Catheter Removal  Patient is present today for a catheter removal.  8ml of water was drained from the balloon. A 16FR foley cath was removed from the bladder, no complications were noted. Patient tolerated well.  Performed by: Keela Rubert CMA  Follow up/ Additional notes: This afternoon   

## 2023-05-02 NOTE — Patient Instructions (Signed)
Medication Instructions:  Your physician recommends that you continue on your current medications as directed. Please refer to the Current Medication list given to you today.  *If you need a refill on your cardiac medications before your next appointment, please call your pharmacy*  Lab Work: No labs ordered  If you have labs (blood work) drawn today and your tests are completely normal, you will receive your results only by: MyChart Message (if you have MyChart) OR A paper copy in the mail If you have any lab test that is abnormal or we need to change your treatment, we will call you to review the results.  Testing/Procedures: No testing ordered  Follow-Up: At [facility], you and your health needs are our priority.  As part of our continuing mission to provide you with exceptional heart care, we have created designated Provider Care Teams.  These Care Teams include your primary Cardiologist (physician) and Advanced Practice Providers (APPs -  Physician Assistants and Nurse Practitioners) who all work together to provide you with the care you need, when you need it.  We recommend signing up for the patient portal called "MyChart".  Sign up information is provided on this After Visit Summary.  MyChart is used to connect with patients for Virtual Visits (Telemedicine).  Patients are able to view lab/test results, encounter notes, upcoming appointments, etc.  Non-urgent messages can be sent to your provider as well.   To learn more about what you can do with MyChart, go to [email or web address].    Your next appointment:   1 year(s)  Provider:   Cameron Lambert, MD or Suzann Riddle, NP 

## 2023-05-18 ENCOUNTER — Encounter: Payer: Self-pay | Admitting: Emergency Medicine

## 2023-05-18 ENCOUNTER — Emergency Department
Admission: EM | Admit: 2023-05-18 | Discharge: 2023-05-18 | Disposition: A | Discharge status: Home / Self Care | Payer: Medicare Other | Attending: Emergency Medicine | Admitting: Emergency Medicine

## 2023-05-18 ENCOUNTER — Other Ambulatory Visit: Payer: Self-pay

## 2023-05-18 ENCOUNTER — Emergency Department: Payer: Medicare Other

## 2023-05-18 DIAGNOSIS — I2510 Atherosclerotic heart disease of native coronary artery without angina pectoris: Secondary | ICD-10-CM | POA: Insufficient documentation

## 2023-05-18 DIAGNOSIS — Z950 Presence of cardiac pacemaker: Secondary | ICD-10-CM | POA: Diagnosis not present

## 2023-05-18 DIAGNOSIS — R000 Tachycardia, unspecified: Secondary | ICD-10-CM | POA: Diagnosis not present

## 2023-05-18 DIAGNOSIS — R1032 Left lower quadrant pain: Secondary | ICD-10-CM | POA: Diagnosis not present

## 2023-05-18 DIAGNOSIS — R0789 Other chest pain: Secondary | ICD-10-CM | POA: Insufficient documentation

## 2023-05-18 DIAGNOSIS — E039 Hypothyroidism, unspecified: Secondary | ICD-10-CM | POA: Diagnosis not present

## 2023-05-18 DIAGNOSIS — R1084 Generalized abdominal pain: Secondary | ICD-10-CM | POA: Diagnosis not present

## 2023-05-18 DIAGNOSIS — R079 Chest pain, unspecified: Secondary | ICD-10-CM | POA: Diagnosis not present

## 2023-05-18 DIAGNOSIS — E119 Type 2 diabetes mellitus without complications: Secondary | ICD-10-CM | POA: Diagnosis not present

## 2023-05-18 DIAGNOSIS — I10 Essential (primary) hypertension: Secondary | ICD-10-CM | POA: Diagnosis not present

## 2023-05-18 DIAGNOSIS — R42 Dizziness and giddiness: Secondary | ICD-10-CM | POA: Diagnosis not present

## 2023-05-18 DIAGNOSIS — M542 Cervicalgia: Secondary | ICD-10-CM | POA: Diagnosis not present

## 2023-05-18 DIAGNOSIS — N281 Cyst of kidney, acquired: Secondary | ICD-10-CM | POA: Diagnosis not present

## 2023-05-18 DIAGNOSIS — R109 Unspecified abdominal pain: Secondary | ICD-10-CM

## 2023-05-18 DIAGNOSIS — Z853 Personal history of malignant neoplasm of breast: Secondary | ICD-10-CM | POA: Diagnosis not present

## 2023-05-18 DIAGNOSIS — C50919 Malignant neoplasm of unspecified site of unspecified female breast: Secondary | ICD-10-CM | POA: Diagnosis not present

## 2023-05-18 DIAGNOSIS — M79605 Pain in left leg: Secondary | ICD-10-CM | POA: Diagnosis not present

## 2023-05-18 DIAGNOSIS — R7989 Other specified abnormal findings of blood chemistry: Secondary | ICD-10-CM | POA: Diagnosis not present

## 2023-05-18 DIAGNOSIS — N200 Calculus of kidney: Secondary | ICD-10-CM | POA: Diagnosis not present

## 2023-05-18 LAB — TYPE AND SCREEN
ABO/RH(D): A POS
Antibody Screen: NEGATIVE

## 2023-05-18 LAB — HEPATIC FUNCTION PANEL
ALT: 8 U/L (ref 0–44)
AST: 17 U/L (ref 15–41)
Albumin: 4 g/dL (ref 3.5–5.0)
Alkaline Phosphatase: 49 U/L (ref 38–126)
Bilirubin, Direct: <0.1 mg/dL (ref 0.0–0.2)
Total Bilirubin: 0.7 mg/dL (ref 0.3–1.2)
Total Protein: 7.2 g/dL (ref 6.5–8.1)

## 2023-05-18 LAB — TROPONIN I (HIGH SENSITIVITY)
Troponin I (High Sensitivity): 7 ng/L (ref <18)
Troponin I (High Sensitivity): 10 ng/L (ref <18)

## 2023-05-18 LAB — BASIC METABOLIC PANEL
Anion gap: 11 (ref 5–15)
BUN: 24 mg/dL — ABNORMAL HIGH (ref 8–23)
CO2: 24 mmol/L (ref 22–32)
Calcium: 9.3 mg/dL (ref 8.9–10.3)
Chloride: 101 mmol/L (ref 98–111)
Creatinine, Ser: 0.78 mg/dL (ref 0.44–1.00)
GFR, Estimated: >60 mL/min (ref >60)
Glucose, Bld: 91 mg/dL (ref 70–99)
Potassium: 4 mmol/L (ref 3.5–5.1)
Sodium: 136 mmol/L (ref 135–145)

## 2023-05-18 LAB — CBC
HCT: 34.6 % — ABNORMAL LOW (ref 36.0–46.0)
Hemoglobin: 10.5 g/dL — ABNORMAL LOW (ref 12.0–15.0)
MCH: 24.7 pg — ABNORMAL LOW (ref 26.0–34.0)
MCHC: 30.3 g/dL (ref 30.0–36.0)
MCV: 81.4 fL (ref 80.0–100.0)
Platelets: 302 10*3/uL (ref 150–400)
RBC: 4.25 MIL/uL (ref 3.87–5.11)
RDW: 14.7 % (ref 11.5–15.5)
WBC: 7.5 10*3/uL (ref 4.0–10.5)
nRBC: 0 % (ref 0.0–0.2)

## 2023-05-18 LAB — D-DIMER, QUANTITATIVE: D-Dimer, Quant: 1.4 ug/mL-FEU — ABNORMAL HIGH (ref 0.00–0.50)

## 2023-05-18 LAB — LIPASE, BLOOD: Lipase: 33 U/L (ref 11–51)

## 2023-05-18 MED ORDER — FENTANYL CITRATE PF 50 MCG/ML IJ SOSY
12.5000 ug | PREFILLED_SYRINGE | Freq: Once | INTRAMUSCULAR | Status: AC
  Administered 2023-05-18: 12.5 ug via INTRAVENOUS
  Filled 2023-05-18: qty 1

## 2023-05-18 MED ORDER — IOHEXOL 350 MG/ML SOLN
100.0000 mL | Freq: Once | INTRAVENOUS | Status: AC | PRN
  Administered 2023-05-18: 80 mL via INTRAVENOUS

## 2023-05-18 MED ORDER — FENTANYL CITRATE PF 50 MCG/ML IJ SOSY: 25.0000 ug | PREFILLED_SYRINGE | Freq: Once | INTRAMUSCULAR | Status: DC

## 2023-05-18 MED ORDER — OXYCODONE HCL 5 MG PO TABS
5.0000 mg | ORAL_TABLET | Freq: Once | ORAL | Status: AC
  Administered 2023-05-18: 5 mg via ORAL
  Filled 2023-05-18: qty 1

## 2023-05-18 MED ORDER — OXYCODONE HCL 5 MG PO TABS: 5.0000 mg | ORAL_TABLET | Freq: Four times a day (QID) | ORAL | 0 refills | Status: DC | PRN

## 2023-05-18 MED ORDER — ACETAMINOPHEN 325 MG PO TABS
650.0000 mg | ORAL_TABLET | ORAL | Status: AC
  Administered 2023-05-18: 650 mg via ORAL
  Filled 2023-05-18: qty 2

## 2023-05-18 NOTE — ED Triage Notes (Signed)
Pt complains of SOB, abd pain, chest pain, nausea and black stool x 1-2 weeks.

## 2023-05-18 NOTE — Discharge Instructions (Addendum)
You had extensive testing in the emergency department which did not show any signs of emergency conditions explain your chest pain and abdominal pain.  Take Tylenol 650 mg every 6 hours for pain as needed.  If you have severe or breakthrough pain as we discussed you may take oxycodone as prescribed.  Please see your primary doctor this week for follow-up appointment.

## 2023-05-18 NOTE — ED Provider Notes (Signed)
[facility] Provider Note    Event Date/Time   First MD Initiated Contact with Patient 05/18/23 1221     (approximate)   History   Chest Pain and Shortness of Breath   HPI  Catherine [name] is a 82 y.o. female history of diabetes hypertension hypothyroidism and breast cancer pacemaker, coronary disease, complete heart block, self-inflicted stab, aortic valve replacement,   Reviewed cardiology note from May 30 patient has a history of permanent pacemaker, heart failure, coronary disease  Patient reports she has had about 4 days of left lower to mid abdominal pain.  She is also noted that her stools seem darker in color.  She has about 1 bowel movement every 2 days.  No vomiting nausea or diarrhea.  No headache. She does relate a slight feeling of shortness of breath in the mornings the last 2 days.  Reports chronic pain in her left leg that comes and goes which is now.  Abdominal pain is present which it has been several pretty much continuously for 4 days now she will intermittently have feeling like it runs up towards her chest and the lower chest wall but cannot describe it well.  Does not feel like it pressure no radiation  Physical Exam   Triage Vital Signs: ED Triage Vitals  Enc Vitals Group     BP 05/18/23 1153 136/63     Pulse Rate 05/18/23 1153 99     Resp 05/18/23 1153 20     Temp 05/18/23 1153 98 F (36.7 C)     Temp src --      SpO2 05/18/23 1153 99 %     Weight 05/18/23 1154 130 lb 1.1 oz (59 kg)     Height 05/18/23 1154 5\' 2"  (1.575 m)     Head Circumference --      Peak Flow --      Pain Score 05/18/23 1154 8     Pain Loc --      Pain Edu? --      Excl. in GC? --     Most recent vital signs: Vitals:   05/18/23 1330 05/18/23 1400  BP: (!) 152/55 133/60  Pulse: 61 65  Resp: (!) 24 19  Temp:    SpO2: 100% 100%     General: Awake, no distress.  Pleasant and well-oriented CV:  Good peripheral perfusion.  Normal tones no noted  murmur.  Normal rate perhaps borderline tachycardia Resp:  Normal effort.  Clear bilaterally.  Speaks in full clear sentences with normal oxygen saturation.  Her lung sounds are clear and her work of breathing appears normal. Abd:  No distention.  She reports reproducible pain primarily over the left mid to left lower abdomen.  No noted pain in the right upper quadrant epigastrium or right lower quadrant. Other:  No lower extremity edema.  Strong palpable dorsalis pedis pulses   ED Results / Procedures / Treatments   Labs (all labs ordered are listed, but only abnormal results are displayed) Labs Reviewed  BASIC METABOLIC PANEL - Abnormal; Notable for the following components:      Result Value   BUN 24 (*)    All other components within normal limits  CBC - Abnormal; Notable for the following components:   Hemoglobin 10.5 (*)    HCT 34.6 (*)    MCH 24.7 (*)    All other components within normal limits  D-DIMER, QUANTITATIVE - Abnormal; Notable for the following components:  D-Dimer, Quant 1.40 (*)    All other components within normal limits  HEPATIC FUNCTION PANEL  LIPASE, BLOOD  OCCULT BLOOD X 1 CARD TO LAB, STOOL  TYPE AND SCREEN  TROPONIN I (HIGH SENSITIVITY)  TROPONIN I (HIGH SENSITIVITY)     EKG  EKG interpreted by me at 12 .ed10 Heart rate 100 QRS 160 QTc 470 Ventricular paced rhythm.  Diffuse elevation or peaked T waves.  T wave morphology appears somewhat unusual, but in the setting of ventricular pacing.  Sensing morphology appears somewhat similar and rhythm and architecture 2 July 14, 2022. No obviuos STEMI in setting of V pacing and old ecgs.  Repeat EKG interpreted by me at 1230 heart rate 90 QRS 160 QTc 530 Probable atrial sensed ventricular paced.  Less definitive to review  RADIOLOGY  Pending.  Will be followed up by Dr. Modesto Charon   PROCEDURES:  Critical Care performed: No  Procedures   MEDICATIONS ORDERED IN ED: Medications  fentaNYL  (SUBLIMAZE) injection 12.5 mcg (12.5 mcg Intravenous Given 05/18/23 1258)  acetaminophen (TYLENOL) tablet 650 mg (650 mg Oral Given 05/18/23 1258)     IMPRESSION / MDM / ASSESSMENT AND PLAN / ED COURSE  I reviewed the triage vital signs and the nursing notes.                              Differential diagnosis includes, but is not limited to, possible intra-abdominal cause including but not limited to peptic ulcer, upper GI bleeding, diverticulitis, kidney stone, colitis, etc.  She does report having some slight intermittent chest pain but her ECG and initial troponin do not clearly demonstrate ischemia.  She has evidence of ventricular pacing.  She does not report that the cardiac chest pain is a notable or severe component, rather she reports primarily 4 days of darker appearing stools and left-sided abdominal pain with discomfort in the left mid to left lower quadrant.  She does report feeling slightly short of breath in the mornings primarily, but she does not appear hypervolemic and does not have abnormality on her pulmonary exam.  She appears to be respirating quite comfortably with primary concern of left-sided abdominal pain and dark stool  Hemoccult performed escorted with nurse Misty Stanley, and no stool noted in the vault.  No significant amount of stool was able to be obtained for testing.  Patient reports having about 1 bowel meant every 2 days formed slightly dark stool, but at this juncture I cannot say that she has any clear evidence of GI bleeding or ischemia.  She does have hemorrhoids  Lab work quite reassuring with normal comprehensive metabolic panel, troponin normal on 2 checks.  Hemoglobin stable and compared with historical.  White count normal.  I would expect if she had significant darkening of stool due to GI bleeding or significant hemorrhage that her hemoglobin would have dropped slightly by now given she reports 4 days of symptoms and also she has not complained of any loose  stool  Patient's presentation is most consistent with acute complicated illness / injury requiring diagnostic workup.   The patient is on the cardiac monitor to evaluate for evidence of arrhythmia and/or significant heart rate changes.  Ongoing care assigned to Dr. Modesto Charon at approximately 3:20 PM.  Pending and D-dimer deemed low risk for PE but given her associated dyspnea and minor or intermittent chest pain if her D-dimer is elevated would proceed with CT of lungs as a  PE study as well as abdomen pelvis.  If D-dimer exclusionary would proceed with CT abdomen pelvis but do not feel that CT of the lungs would be needed at that time. UA is also pending       FINAL CLINICAL IMPRESSION(S) / ED DIAGNOSES   Final diagnoses:  Atypical chest pain  Left sided abdominal pain     Rx / DC Orders   ED Discharge Orders     None        Note:  This document was prepared using Dragon voice recognition software and may include unintentional dictation errors.   Sharyn Creamer, MD 05/18/23 [phone]

## 2023-05-18 NOTE — ED Triage Notes (Signed)
First nurse note: Arrived by [facility] from home with c/o neck paiun, nausea, and dizziness. Also c/o abdominal pain with dark BM today.  Has pacemaker, history left side breast cancer  EMS administered 4mg  zofran and 500cc NS  A&O x4 per EMS  Per EMS vitals: 180/70 b/p 100-200 HR 88CBG  20G R forearm

## 2023-05-18 NOTE — ED Provider Notes (Signed)
I was asked to follow-up on imaging.  Imaging of the chest and abdomen shows no acute emergent pathologies.  Unclear what is causing her pain.  Discharged with pain recommendation she tolerated a short course of oxycodone back in March we will represcribe several doses for severe/breakthrough pain have her follow-up with PMD.  Return if any new or worsening symptoms.   Pilar Jarvis, MD 05/18/23 [phone]

## 2023-05-20 ENCOUNTER — Ambulatory Visit: Payer: Medicare Other

## 2023-05-21 NOTE — Chronic Care Management (AMB) (Signed)
Follow Up Pharmacist Visit (CCM)  Level,Catherine [name] 81 years, Female  DOB: [birth year]  M: [phone]  __________________________________________________ Clinical Summary Summary for PCP:  - Patient was at the ER a few days ago and is recovering at this time. She has not been checking her BP at home. - She has an appt with PCP in few days for F/U and the daughter would like to talk about Boost (or supplement for weight gain). . Patient Chart Prep (HC) Chronic Conditions Patient's Chronic Conditions: Hypertension (HTN), Hyperlipidemia/Dyslipidemia (HLD), Heart Failure, Cardiovascular Disease (CVD), Gastroesophageal Reflux Disease (GERD), Diabetes (DM), Hypothyroidism, Osteoarthritis, Chronic Kidney Disease (CKD), Depression Doctor and Hospital Visits Were there PCP Visits since last visit with the Pharmacist?: No Were there Specialist Visits since last visit with the Pharmacist?: Yes Specialist Visits details: 04/01/23 [facility] (Cardiology)- 6 month follow up. No medication changes noted. 05/02/23 [facility] (Urology)- Urine retention. No medication changes noted. 05/02/23 Riddle, NP (Cardiology)- Follow up. No medication changes noted. 05/02/23 Vaillancourt, PA (Urology)- Follow up. No medication changes noted. Was there a Hospital Visit in last 30 days?: No Were there other Hospital Visits since last visit with the Pharmacist?: Yes Hospital Visits details: 03/30/23 ED for Abdominal pain and Urinary Frequency. No medication changes noted. Engagement Notes dimock, eugenia on 05/15/2023 11:37 AM   CCM Billed Time: HC Chart Prep: 30 minutes (05/15/23)   Bostick, Ashlea on 04/18/2023 12:23 PM 05/20/2023 @ 4pm Pre-Call Questions (HC) Pre-Call Questions Date:: 05/17/2023 Time:: AM Outcome:: Unsuccessful - Left message Relevant details: LVM with appt details. Engagement Notes thompson, joni on 05/17/2023 11:24 AM nonbillable time: 0:02:00 Disease Assessments Visit Date Visit Completed on:  05/20/2023 Subjective Information Subjective: Patient went to the ER with chest pain and couldn't breathe. She still has stomach pain and butt pain. Daughter helps her, along with daughter's grand kids.  Lifestyle habits: Diet: she doesn't eat enough and per daughter is "wasting away". Sometimes she eats spaghetti or lean chicken, grapes, cereal and oatmeal. What is the patient's sleep pattern?: Other (with free form text) Details: fairly well, wake up time is 8am and bedtime is 10pm. SDOH: Accountable Health Communities Health-Related Social Needs Screening Tool ([employer]) SDOH questions were documented in [employer] within the past 12 months or since hospitalization?: Yes Medication Adherence Does the [facility] have access to medication refill history?: No Hypertension (HTN) Most Recent BP: 110/50 Most Recent HR: 76 taken on: 05/02/2023 Care Gap: Need BP documented or last BP 140/90 or higher: Addressed Assessed today?: Yes BP today is: unable to obtain during visit Goal: <130/80 mmHG Is Patient checking BP at home?: Yes Has patient experienced hypotension, dizziness, falls or bradycardia?: No Assessment:: Uncontrolled Drug:  Imdur 30mg  Pharmacist Assessment: Appropriate, Query Effectiveness Drug: Losartan 25mg , 1/2 tablet daily Pharmacist Assessment: Appropriate, Query Effectiveness Drug:  Metoprolol Tartrate 25mg  1/2 tablet BID  Pharmacist Assessment: Appropriate, Query Effectiveness Hyperlipidemia/Dyslipidemia (HLD) Last Lipid panel on: 05/15/2023 TC (Goal<200): Unable to locate in EMR LDL: Unable to locate in EMR HDL (Goal>40): Unable to locate in EMR TG (Goal<150): Unable to locate in EMR ASCVD 10-year risk?is:: N/A due to Age > 41 Assessed today?: No Drug: Atorvastatin 40mg  Drug: Zetia 10mg  Diabetes (DM) Most recent A1C: Unable to locate in EMR Most Recent GFR: >60 taken on: 03/30/2023 Type: 2 Most recent  microalbumin ratio: Unable to locate in EMR Care Gap: Statin therapy needed: Addressed Care Gap: Need A1c documented or last A1c > 9 %: Patient excluded from population (Age > 75 Care Gap: Need eye  exam documented in EMR or by claim: Patient excluded from population (Age > 7 Care Gap: Need eGFR and uACR for kidney health evaluation: Patient excluded from population (Age > 4 Assessed today?: No Drug: None Heart Failure Most recent Echo with EF: 55% taken on: 03/06/2022 Assessed today?: No Drug: Losartan 25mg  1/2 tab daily Drug: Imdur 30mg  Drug: Metoprolol Tartrate 25mg  1/2 tablet BID  Depression Assessed today?: No Drug: Clonazepam 0.5mg  qhs Drug: Sertraline 100mg   GERD Assessment Assessed today?: No Drug:  Famotidine 40mg  Drug: Pantoprazole 40mg   Chronic kidney disease (CKD) Most Recent GFR: >60 taken on: 03/30/2023 Previous GFR: >60 taken on: 03/23/2023 Assessed today?: No Cardiovascular Disease (CVD) Care Gap: Moderate / high intensity statin therapy needed: Addressed Assessed today?: No Drug: ASA 81mg   Hypothyroidism Most recent TSH: Unable to locate in EMR Assessed today?: No Drug:  Levothyroxine Osteoarthritis Assessed today?: No Drug: Acetaminophen 325mg , 2 tablets q4h prn Drug: Oxycodone 5mg  q8h prn Preventative Health Care Gap: Colorectal cancer screening: Patient excluded from population (Age > 63, hx of colorectal cancer or colectomy, hospice services) Care Gap: Breast cancer screening: Patient excluded from population (Age > 62, hx of bilateral mastectomy, frailty, hospice services) Care Gap: Annual Wellness Visit (AWV): Needs to be addressed Engagement Notes Laury Axon on 05/20/2023 08:56 AM Giovani Neumeister, patient was in the ED 05/18/2023 [facility] Televisit  Documentation 

## 2023-05-23 ENCOUNTER — Telehealth: Payer: Self-pay

## 2023-05-23 NOTE — Telephone Encounter (Signed)
Transition Care Management Unsuccessful Follow-up Telephone Call  Date of discharge and from where:  05/18/2023 [facility]  Attempts:  1st Attempt  Reason for unsuccessful TCM follow-up call:  No answer/busy  Britten Seyfried [facility]  [facility] Population Health Community Resource Care Guide   [email or web address]  ?? [phone]   Website: [email or web address]  [email or web address]

## 2023-05-27 ENCOUNTER — Telehealth: Payer: Self-pay

## 2023-05-27 NOTE — Telephone Encounter (Signed)
Transition Care Management Unsuccessful Follow-up Telephone Call  Date of discharge and from where:  05/18/2023  Attempts:  2nd Attempt  Reason for unsuccessful TCM follow-up call:  No answer/busy  Catherine [name] [facility]  [facility] Population Health Community Resource Care Guide   [email or web address]  ?? [phone]   Website: [email or web address]  [email or web address]

## 2023-06-10 ENCOUNTER — Ambulatory Visit (INDEPENDENT_AMBULATORY_CARE_PROVIDER_SITE_OTHER): Payer: Medicare Other | Admitting: Cardiology

## 2023-06-10 ENCOUNTER — Encounter: Payer: Self-pay | Admitting: Cardiology

## 2023-06-10 VITALS — BP 116/66 | HR 60 | Ht 62.0 in | Wt 134.0 lb

## 2023-06-10 DIAGNOSIS — E119 Type 2 diabetes mellitus without complications: Secondary | ICD-10-CM | POA: Diagnosis not present

## 2023-06-10 DIAGNOSIS — E038 Other specified hypothyroidism: Secondary | ICD-10-CM | POA: Diagnosis not present

## 2023-06-10 DIAGNOSIS — Z1211 Encounter for screening for malignant neoplasm of colon: Secondary | ICD-10-CM

## 2023-06-10 DIAGNOSIS — I10 Essential (primary) hypertension: Secondary | ICD-10-CM | POA: Diagnosis not present

## 2023-06-10 DIAGNOSIS — E782 Mixed hyperlipidemia: Secondary | ICD-10-CM

## 2023-06-10 NOTE — Progress Notes (Deleted)
Established Patient Office Visit  Subjective:  Patient ID: Catherine [name], female    DOB: [birth year]  Age: 82 y.o. MRN: [id number]  Chief Complaint  Patient presents with   Follow-up    Follow up, discuss lab results.    Patient in office for overdue follow up. Patient has been hospitalized multiple times since previous visit for abdominal pain and constipation.  Patient complains of frequent nausea and constipation. Uses Miralax, suppositories, and enemas at home. Colonoscopy completed 07/2022, a single medium sized localized angiodysplastic lesion with bleeding found in the descending colon, coagulation for tissue destruction was successful. In addition, a polyp found in the descending colon was not removed due to patient being on anticoagulation. Patient was to follow up as an outpatient to discuss polyp removal. This was not scheduled. New referral sent to GI.  Fasting labs not completed, order placed.     No other concerns at this time.   Past Medical History:  Diagnosis Date   Acute colitis    Anemia    CAD (coronary artery disease)    a. heavy calcification of the entire LAD & mod eccentric mid LAD stenosis, estimated @ 70%, mild nonobs stenosis of RCA and LCx, severely calcified Ao valve with restricted valve mobility and 2+ AI     Cancer (HCC) 1981   breast   CHB (complete heart block) (HCC) 08/31/2015   [employer] Ass[employer] DR  model [id number] (serial number  [id number] )    Chronic diastolic congestive heart failure (HCC)    a. echo 08/31/2015: EF 65-70%, nl WM, GR1DD, Ao valve stent bioprosthesis was present and functioning nl, no regurg, LA mildly dilated, PASP 46 mm Hg   Collagen vascular disease (HCC)    Colon polyps    Depression    Diabetes mellitus type II    Fibromyalgia    HTN (hypertension)    Hypercholesteremia    Hypothyroidism    IBS (irritable bowel syndrome)    Presence of permanent cardiac pacemaker    Rectal bleeding    Rheumatoid arthritis(714.0)     S/P TAVR (transcatheter aortic valve replacement) 08/30/2015   26 mm Edwards Sapien 3 transcatheter heart valve placed via open right transfemoral approach   Shortness of breath dyspnea    Stenosis of aortic valve    Suicide attempt (HCC)    Thyroid disease     Past Surgical History:  Procedure Laterality Date   ABDOMINAL SURGERY     Intestinal surgery   CARDIAC SURGERY     CESAREAN SECTION     x 2   COLONOSCOPY WITH PROPOFOL N/A 07/25/2022   Procedure: COLONOSCOPY WITH PROPOFOL;  Surgeon: Regis Bill, MD;  Location: [facility] ENDOSCOPY;  Service: Endoscopy;  Laterality: N/A;   EP IMPLANTABLE DEVICE N/A 08/31/2015   Procedure: Pacemaker Implant;  Surgeon: Will Jorja Loa, MD; [facility] Assurity DR  model [id number] (serial number  [id number] ); Laterality: Right   ESOPHAGOGASTRODUODENOSCOPY (EGD) WITH PROPOFOL N/A 04/04/2021   Procedure: ESOPHAGOGASTRODUODENOSCOPY (EGD) WITH PROPOFOL;  Surgeon: Wyline Mood, MD;  Location: [facility] ENDOSCOPY;  Service: Gastroenterology;  Laterality: N/A;   LAPAROSCOPY N/A 01/31/2020   Procedure: LAPAROSCOPY DIAGNOSTIC;  Surgeon: Sheliah Hatch De Blanch, MD;  Location: [facility] OR;  Service: General;  Laterality: N/A;   LAPAROTOMY N/A 01/31/2020   Procedure: Exploratory Laparotomy;  Surgeon: Sheliah Hatch De Blanch, MD;  Location: [facility] OR;  Service: General;  Laterality: N/A;   LEFT OOPHORECTOMY     LYSIS OF ADHESION  01/31/2020   Procedure: Lysis Of Adhesions, Ligation of Falciform Vein;  Surgeon: Kinsinger, De Blanch, MD;  Location: [facility] OR;  Service: General;;   MASTECTOMY Left    polyp      self inflicted chest wound     SPINE SURGERY  1981   lower back   TEE WITHOUT CARDIOVERSION N/A 08/30/2015   Procedure: TRANSESOPHAGEAL ECHOCARDIOGRAM (TEE);  Surgeon: Tonny Bollman, MD;  Location: [facility] OR;  Service: Open Heart Surgery;  Laterality: N/A;   TOTAL ABDOMINAL HYSTERECTOMY     TRANSCATHETER AORTIC VALVE REPLACEMENT, TRANSFEMORAL N/A 08/30/2015   Procedure:  TRANSCATHETER AORTIC VALVE REPLACEMENT, TRANSFEMORAL;  Surgeon: Tonny Bollman, MD;  Location: [facility] OR;  Service: Open Heart Surgery;  Laterality: N/A;    Social History   Socioeconomic History   Marital status: Married    Spouse name: Not on file   Number of children: Not on file   Years of education: Not on file   Highest education level: Not on file  Occupational History   Not on file  Tobacco Use   Smoking status: Never   Smokeless tobacco: Never  Vaping Use   Vaping Use: Never used  Substance and Sexual Activity   Alcohol use: No    Alcohol/week: 0.0 standard drinks of alcohol   Drug use: No   Sexual activity: Not Currently  Other Topics Concern   Not on file  Social History Narrative   ** Merged History Encounter **       Social Determinants of Health   Financial Resource Strain: Not on file  Food Insecurity: Not on file  Transportation Needs: Not on file  Physical Activity: Not on file  Stress: Not on file  Social Connections: Not on file  Intimate Partner Violence: Not on file    Family History  Problem Relation Age of Onset   Depression Sister    Depression Sister    Depression Sister    Hypertension Mother    Lung cancer Sister    Stroke Sister    Hypertension Sister    Pneumonia Brother    Heart attack Neg Hx     Allergies  Allergen Reactions   Tetracyclines & Related Anaphylaxis and Rash   Cephalexin Diarrhea   Sulfa Antibiotics Hives   Latex Rash    Review of Systems  Constitutional: Negative.   HENT: Negative.    Eyes: Negative.   Respiratory: Negative.  Negative for cough and shortness of breath.   Cardiovascular: Negative.  Negative for chest pain, palpitations and leg swelling.  Gastrointestinal:  Positive for abdominal pain, constipation and nausea. Negative for diarrhea, heartburn and vomiting.  Genitourinary: Negative.  Negative for dysuria and flank pain.  Musculoskeletal: Negative.  Negative for joint pain and myalgias.  Skin:  Negative.   Neurological: Negative.  Negative for dizziness and headaches.  Endo/Heme/Allergies: Negative.   Psychiatric/Behavioral: Negative.  Negative for depression and suicidal ideas. The patient is not nervous/anxious.        Objective:   BP 116/66   Pulse 60   Ht 5\' 2"  (1.575 m)   Wt 134 lb (60.8 kg)   SpO2 98%   BMI 24.51 kg/m   Vitals:   06/10/23 1408  BP: 116/66  Pulse: 60  Height: 5\' 2"  (1.575 m)  Weight: 134 lb (60.8 kg)  SpO2: 98%  BMI (Calculated): 24.5    Physical Exam Vitals and nursing note reviewed.  Constitutional:      Appearance: Normal appearance.  HENT:  Head: Normocephalic and atraumatic.     Nose: Nose normal.     Mouth/Throat:     Mouth: Mucous membranes are moist.     Pharynx: Oropharynx is clear.  Eyes:     Conjunctiva/sclera: Conjunctivae normal.     Pupils: Pupils are equal, round, and reactive to light.  Cardiovascular:     Rate and Rhythm: Normal rate and regular rhythm.     Pulses: Normal pulses.     Heart sounds: Normal heart sounds. No murmur heard. Pulmonary:     Effort: Pulmonary effort is normal.     Breath sounds: Normal breath sounds. No wheezing.  Abdominal:     General: Bowel sounds are normal.     Palpations: Abdomen is soft.     Tenderness: There is abdominal tenderness. There is no right CVA tenderness or left CVA tenderness.  Musculoskeletal:        General: Normal range of motion.     Cervical back: Normal range of motion.     Right lower leg: No edema.     Left lower leg: No edema.  Skin:    General: Skin is warm and dry.  Neurological:     General: No focal deficit present.     Mental Status: She is alert and oriented to person, place, and time.  Psychiatric:        Mood and Affect: Mood normal.        Behavior: Behavior normal.      No results found for any visits on 06/10/23.  Recent Results (from the past 2160 hour(s))  Lipase, blood     Status: None   Collection Time: 03/23/23 11:33 AM   Result Value Ref Range   Lipase 31 11 - 51 U/L    Comment: Performed at [facility], [address] [place], [place] [address]  Comprehensive metabolic panel     Status: Abnormal   Collection Time: 03/23/23 11:33 AM  Result Value Ref Range   Sodium 135 135 - 145 mmol/L   Potassium 4.0 3.5 - 5.1 mmol/L   Chloride 101 98 - 111 mmol/L   CO2 26 22 - 32 mmol/L   Glucose, Bld 112 (H) 70 - 99 mg/dL    Comment: Glucose reference range applies only to samples taken after fasting for at least 8 hours.   BUN 19 8 - 23 mg/dL   Creatinine, Ser 6.04 0.44 - 1.00 mg/dL   Calcium 9.3 8.9 - 54.0 mg/dL   Total Protein 6.9 6.5 - 8.1 g/dL   Albumin 3.7 3.5 - 5.0 g/dL   AST 18 15 - 41 U/L   ALT 8 0 - 44 U/L   Alkaline Phosphatase 46 38 - 126 U/L   Total Bilirubin 0.7 0.3 - 1.2 mg/dL   GFR, Estimated >98 >11 mL/min    Comment: (NOTE) Calculated using the CKD-EPI Creatinine Equation (2021)    Anion gap 8 5 - 15    Comment: Performed at [facility], [address] [place], [place] [address]  CBC     Status: Abnormal   Collection Time: 03/23/23 11:33 AM  Result Value Ref Range   WBC 7.0 4.0 - 10.5 K/uL   RBC 4.23 3.87 - 5.11 MIL/uL   Hemoglobin 10.5 (L) 12.0 - 15.0 g/dL   HCT 29.5 (L) 62.1 - 30.8 %   MCV 83.7 80.0 - 100.0 fL   MCH 24.8 (L) 26.0 - 34.0 pg   MCHC 29.7 (L) 30.0 - 36.0  g/dL   RDW 16.1 09.6 - 04.5 %   Platelets 272 150 - 400 K/uL   nRBC 0.0 0.0 - 0.2 %    Comment: Performed at [facility], [address] [place], [place] [address]  Urinalysis, Routine w reflex microscopic -Urine, Clean Catch     Status: Abnormal   Collection Time: 03/23/23  5:58 PM  Result Value Ref Range   Color, Urine STRAW (A) YELLOW   APPearance CLEAR (A) CLEAR   Specific Gravity, Urine 1.028 1.005 - 1.030   pH 7.0 5.0 - 8.0   Glucose, UA NEGATIVE NEGATIVE mg/dL   Hgb urine dipstick NEGATIVE NEGATIVE   Bilirubin Urine NEGATIVE NEGATIVE   Ketones, ur NEGATIVE NEGATIVE  mg/dL   Protein, ur NEGATIVE NEGATIVE mg/dL   Nitrite NEGATIVE NEGATIVE   Leukocytes,Ua NEGATIVE NEGATIVE    Comment: Performed at [facility], [address] [place], [place] [address]  Lipase, blood     Status: None   Collection Time: 03/30/23  5:56 PM  Result Value Ref Range   Lipase 29 11 - 51 U/L    Comment: Performed at [facility], [address] [place], [place] [address]  Comprehensive metabolic panel     Status: None   Collection Time: 03/30/23  5:56 PM  Result Value Ref Range   Sodium 135 135 - 145 mmol/L   Potassium 4.3 3.5 - 5.1 mmol/L   Chloride 100 98 - 111 mmol/L   CO2 24 22 - 32 mmol/L   Glucose, Bld 95 70 - 99 mg/dL    Comment: Glucose reference range applies only to samples taken after fasting for at least 8 hours.   BUN 20 8 - 23 mg/dL   Creatinine, Ser 2.13 0.44 - 1.00 mg/dL   Calcium 9.4 8.9 - 08.6 mg/dL   Total Protein 7.1 6.5 - 8.1 g/dL   Albumin 4.1 3.5 - 5.0 g/dL   AST 15 15 - 41 U/L   ALT 8 0 - 44 U/L   Alkaline Phosphatase 48 38 - 126 U/L   Total Bilirubin 0.8 0.3 - 1.2 mg/dL   GFR, Estimated >57 >84 mL/min    Comment: (NOTE) Calculated using the CKD-EPI Creatinine Equation (2021)    Anion gap 11 5 - 15    Comment: Performed at [facility], [address] [place], [place] [address]  CBC     Status: Abnormal   Collection Time: 03/30/23  5:56 PM  Result Value Ref Range   WBC 7.6 4.0 - 10.5 K/uL   RBC 4.33 3.87 - 5.11 MIL/uL   Hemoglobin 10.7 (L) 12.0 - 15.0 g/dL   HCT 52.8 (L) 41.3 - 24.4 %   MCV 82.2 80.0 - 100.0 fL   MCH 24.7 (L) 26.0 - 34.0 pg   MCHC 30.1 30.0 - 36.0 g/dL   RDW 01.0 27.2 - 53.6 %   Platelets 288 150 - 400 K/uL   nRBC 0.0 0.0 - 0.2 %    Comment: Performed at [facility], [address] [place], [place] [address]  Urinalysis, Routine w reflex microscopic -Urine, Clean Catch     Status: Abnormal   Collection Time: 03/30/23  5:56 PM  Result Value Ref Range   Color, Urine YELLOW  (A) YELLOW   APPearance HAZY (A) CLEAR   Specific Gravity, Urine 1.009 1.005 - 1.030   pH 7.0 5.0 - 8.0   Glucose, UA NEGATIVE NEGATIVE mg/dL   Hgb urine dipstick NEGATIVE NEGATIVE  Bilirubin Urine NEGATIVE NEGATIVE   Ketones, ur NEGATIVE NEGATIVE mg/dL   Protein, ur NEGATIVE NEGATIVE mg/dL   Nitrite NEGATIVE NEGATIVE   Leukocytes,Ua LARGE (A) NEGATIVE   RBC / HPF 6-10 0 - 5 RBC/hpf   WBC, UA 21-50 0 - 5 WBC/hpf   Bacteria, UA MANY (A) NONE SEEN   Squamous Epithelial / HPF 11-20 0 - 5 /HPF   Mucus PRESENT     Comment: Performed at [facility], [address], [place], [place] [address]  Urine Culture     Status: Abnormal   Collection Time: 03/30/23  5:56 PM   Specimen: Urine, Clean Catch  Result Value Ref Range   Specimen Description      URINE, CLEAN CATCH Performed at [facility], [address], [place], [place] [address]    Special Requests      NONE Performed at [facility], [address], [place], [place] [address]    Culture (A)     10,000 COLONIES/mL MULTIPLE SPECIES PRESENT, SUGGEST RECOLLECTION   Report Status 04/01/2023 FINAL   CUP PACEART REMOTE DEVICE CHECK     Status: None   Collection Time: 04/11/23  2:00 AM  Result Value Ref Range   Date Time Interrogation Session [id number]    Pulse Generator Manufacturer SJCR    Pulse Gen Model 2240 Assurity    Pulse Gen Serial Number [id number]    Clinic Name [facility]    Implantable Pulse Generator Type Implantable Pulse Generator    Implantable Pulse Generator Implant Date 41324401    Implantable Lead Manufacturer [facility]    Implantable Lead Model [id number] Tendril STS    Implantable Lead Serial Number [id number]    Implantable Lead Implant Date [id number]    Implantable Lead Location Detail 1 UNKNOWN    Implantable Lead Location [id number]    Implantable Lead Connection Status [id number]    Implantable Lead Manufacturer [facility]    Implantable Lead Model [phone] Tendril STS    Implantable  Lead Serial Number [id number]    Implantable Lead Implant Date 38756433    Implantable Lead Location Detail 1 APEX    Implantable Lead Location [id number]    Implantable Lead Connection Status [id number]    Lead Channel Setting Sensing Sensitivity 2.5 mV   Lead Channel Setting Sensing Adaptation Mode Fixed Pacing    Lead Channel Setting Pacing Amplitude 1.75 V   Lead Channel Setting Pacing Pulse Width 0.4 ms   Lead Channel Setting Pacing Amplitude 0.875    Lead Channel Status NULL    Lead Channel Impedance Value 390 ohm   Lead Channel Sensing Intrinsic Amplitude 3.2 mV   Lead Channel Pacing Threshold Amplitude 0.75 V   Lead Channel Pacing Threshold Pulse Width 0.4 ms   Lead Channel Status NULL    Lead Channel Impedance Value 550 ohm   Lead Channel Sensing Intrinsic Amplitude 5.7 mV   Lead Channel Pacing Threshold Amplitude 0.625 V   Lead Channel Pacing Threshold Pulse Width 0.4 ms   Battery Status MOS    Battery Remaining Longevity 36 mo   Battery Remaining Percentage 29.0 %   Battery Voltage 2.92 V   Brady Statistic RA Percent Paced 18.0 %   Brady Statistic RV Percent Paced 95.0 %   Brady Statistic AP VP Percent 15.0 %   Brady Statistic AS VP Percent 79.0 %   Brady Statistic AP VS Percent 2.8 %   Brady Statistic AS VS Percent 2.1 %  CUP PACEART INCLINIC DEVICE  CHECK     Status: None   Collection Time: 05/02/23  3:22 PM  Result Value Ref Range   Date Time Interrogation Session [id number]    Pulse Generator Manufacturer SJCR    Pulse Gen Model 2240 Assurity    Pulse Gen Serial Number [id number]    Clinic Name [facility]    Implantable Pulse Generator Type Implantable Pulse Generator    Implantable Pulse Generator Implant Date 95621308    Implantable Lead Manufacturer [facility]    Implantable Lead Model [phone] Tendril STS    Implantable Lead Serial Number [id number]    Implantable Lead Implant Date 96295284    Implantable Lead Location Detail 1 UNKNOWN    Implantable Lead  Location [id number]    Implantable Lead Connection Status [id number]    Implantable Lead Manufacturer [facility]    Implantable Lead Model [phone] Tendril STS    Implantable Lead Serial Number [id number]    Implantable Lead Implant Date 10272536    Implantable Lead Location Detail 1 APEX    Implantable Lead Location [id number]    Implantable Lead Connection Status [id number]    Lead Channel Setting Sensing Sensitivity 2.5 mV   Lead Channel Setting Pacing Amplitude 1.75 V   Lead Channel Setting Pacing Pulse Width 0.4 ms   Lead Channel Setting Pacing Amplitude 0.875    Lead Channel Impedance Value 400.0 ohm   Lead Channel Sensing Intrinsic Amplitude 3.4 mV   Lead Channel Pacing Threshold Amplitude 0.75 V   Lead Channel Pacing Threshold Pulse Width 0.4 ms   Lead Channel Pacing Threshold Amplitude 0.75 V   Lead Channel Pacing Threshold Pulse Width 0.4 ms   Lead Channel Impedance Value 562.5 ohm   Lead Channel Sensing Intrinsic Amplitude 5.1 mV   Lead Channel Pacing Threshold Amplitude 0.75 V   Lead Channel Pacing Threshold Pulse Width 0.4 ms   Lead Channel Pacing Threshold Amplitude 0.75 V   Lead Channel Pacing Threshold Pulse Width 0.4 ms   Battery Status Unknown    Battery Remaining Longevity 36 mo   Battery Voltage 2.92 V   Brady Statistic RA Percent Paced 19.0 %   Brady Statistic RV Percent Paced 94.0 %  BLADDER SCAN AMB NON-IMAGING     Status: None   Collection Time: 05/02/23  3:26 PM  Result Value Ref Range   Scan Result 0 ml  Basic metabolic panel     Status: Abnormal   Collection Time: 05/18/23 11:59 AM  Result Value Ref Range   Sodium 136 135 - 145 mmol/L   Potassium 4.0 3.5 - 5.1 mmol/L   Chloride 101 98 - 111 mmol/L   CO2 24 22 - 32 mmol/L   Glucose, Bld 91 70 - 99 mg/dL    Comment: Glucose reference range applies only to samples taken after fasting for at least 8 hours.   BUN 24 (H) 8 - 23 mg/dL   Creatinine, Ser 6.44 0.44 - 1.00 mg/dL   Calcium 9.3 8.9 - 03.4 mg/dL   GFR, Estimated >74  >25 mL/min    Comment: (NOTE) Calculated using the CKD-EPI Creatinine Equation (2021)    Anion gap 11 5 - 15    Comment: Performed at [facility], [address] [place], [place] [address]  CBC     Status: Abnormal   Collection Time: 05/18/23 11:59 AM  Result Value Ref Range   WBC 7.5 4.0 - 10.5 K/uL   RBC 4.25 3.87 - 5.11 MIL/uL   Hemoglobin 10.5 (L) 12.0 -  15.0 g/dL   HCT 41.3 (L) 24.4 - 01.0 %   MCV 81.4 80.0 - 100.0 fL   MCH 24.7 (L) 26.0 - 34.0 pg   MCHC 30.3 30.0 - 36.0 g/dL   RDW 27.2 53.6 - 64.4 %   Platelets 302 150 - 400 K/uL   nRBC 0.0 0.0 - 0.2 %    Comment: Performed at [facility], [address] [place], [place] [address]  Troponin I (High Sensitivity)     Status: None   Collection Time: 05/18/23 11:59 AM  Result Value Ref Range   Troponin I (High Sensitivity) 7 <18 ng/L    Comment: (NOTE) Elevated high sensitivity troponin I (hsTnI) values and significant  changes across serial measurements may suggest ACS but many other  chronic and acute conditions are known to elevate hsTnI results.  Refer to the "Links" section for chest pain algorithms and additional  guidance. Performed at [facility], [address] [place], [place] [address]   Type and screen [facility]     Status: None   Collection Time: 05/18/23 11:59 AM  Result Value Ref Range   ABO/RH(D) A POS    Antibody Screen NEG    Sample Expiration      05/21/2023,2359 Performed at [facility], [address] [place], [place] [address]   Hepatic function panel     Status: None   Collection Time: 05/18/23 11:59 AM  Result Value Ref Range   Total Protein 7.2 6.5 - 8.1 g/dL   Albumin 4.0 3.5 - 5.0 g/dL   AST 17 15 - 41 U/L   ALT 8 0 - 44 U/L   Alkaline Phosphatase 49 38 - 126 U/L   Total Bilirubin 0.7 0.3 - 1.2 mg/dL   Bilirubin, Direct <4.3 0.0 - 0.2 mg/dL   Indirect Bilirubin NOT CALCULATED 0.3 - 0.9 mg/dL    Comment: Performed at  [facility], [address] [place], [place] [address]  Lipase, blood     Status: None   Collection Time: 05/18/23 11:59 AM  Result Value Ref Range   Lipase 33 11 - 51 U/L    Comment: Performed at [facility], [address] [place], [place] [address]  Troponin I (High Sensitivity)     Status: None   Collection Time: 05/18/23  2:01 PM  Result Value Ref Range   Troponin I (High Sensitivity) 10 <18 ng/L    Comment: (NOTE) Elevated high sensitivity troponin I (hsTnI) values and significant  changes across serial measurements may suggest ACS but many other  chronic and acute conditions are known to elevate hsTnI results.  Refer to the "Links" section for chest pain algorithms and additional  guidance. Performed at [facility], [address] [place], [place] [address]   D-dimer, quantitative     Status: Abnormal   Collection Time: 05/18/23  3:02 PM  Result Value Ref Range   D-Dimer, Quant 1.40 (H) 0.00 - 0.50 ug/mL-FEU    Comment: (NOTE) At the manufacturer cut-off value of 0.5 g/mL FEU, this assay has a negative predictive value of 95-100%.This assay is intended for use in conjunction with a clinical pretest probability (PTP) assessment model to exclude pulmonary embolism (PE) and deep venous thrombosis (DVT) in outpatients suspected of PE or DVT. Results should be correlated with clinical presentation. Performed at [facility], [address], [place], [place] [address]       Assessment & Plan:  Return for  fasting labs. Schedule appointment with GI to discuss polyp removal, chronic constipation.   Problem List Items Addressed This Visit     Essential hypertension - Primary (Chronic)   Relevant Orders   CBC With Diff/Platelet   CMP14+EGFR   Hypothyroidism (Chronic)   Relevant Orders   CBC With Diff/Platelet   TSH   Hyperlipidemia   Relevant Orders   Lipid Profile   Other Visit Diagnoses     Diabetes mellitus without  complication (HCC)       Relevant Orders   HgB A1c   Colon cancer screening       Relevant Orders   Ambulatory referral to Gastroenterology       Return in about 4 weeks (around 07/08/2023).   Total time spent: 30 minutes  Margaretann Loveless, MD  06/10/2023   This document may have been prepared by [facility] Voice Recognition software and as such may include unintentional dictation errors.

## 2023-06-12 NOTE — Progress Notes (Signed)
Established Patient Office Visit  Subjective:  Patient ID: Catherine [name], female    DOB: 1941-11-22  Age: 82 y.o. MRN: [id number]  Chief Complaint  Patient presents with   Follow-up    Follow up, discuss lab results.    Patient in office for overdue follow up. Patient has been hospitalized multiple times since previous visit for abdominal pain and constipation.  Patient complains of frequent nausea and constipation. Uses Miralax, suppositories, and enemas at home. Colonoscopy completed 07/2022, a single medium sized localized angiodysplastic lesion with bleeding found in the descending colon, coagulation for tissue destruction was successful. In addition, a polyp found in the descending colon was not removed due to patient being on anticoagulation. Patient was to follow up as an outpatient to discuss polyp removal. This was not scheduled. New referral sent to GI.  Fasting labs not completed, order placed.     No other concerns at this time.   Past Medical History:  Diagnosis Date   Acute colitis    Anemia    CAD (coronary artery disease)    a. heavy calcification of the entire LAD & mod eccentric mid LAD stenosis, estimated @ 70%, mild nonobs stenosis of RCA and LCx, severely calcified Ao valve with restricted valve mobility and 2+ AI     Cancer (HCC) 1981   breast   CHB (complete heart block) (HCC) 08/31/2015   [employer] Assurity DR  model [id number] (serial number  [id number] )    Chronic diastolic congestive heart failure (HCC)    a. echo 08/31/2015: EF 65-70%, nl WM, GR1DD, Ao valve stent bioprosthesis was present and functioning nl, no regurg, LA mildly dilated, PASP 46 mm Hg   Collagen vascular disease (HCC)    Colon polyps    Depression    Diabetes mellitus type II    Fibromyalgia    HTN (hypertension)    Hypercholesteremia    Hypothyroidism    IBS (irritable bowel syndrome)    Presence of permanent cardiac pacemaker    Rectal bleeding    Rheumatoid arthritis(714.0)     S/P TAVR (transcatheter aortic valve replacement) 08/30/2015   26 mm Edwards Sapien 3 transcatheter heart valve placed via open right transfemoral approach   Shortness of breath dyspnea    Stenosis of aortic valve    Suicide attempt (HCC)    Thyroid disease     Past Surgical History:  Procedure Laterality Date   ABDOMINAL SURGERY     Intestinal surgery   CARDIAC SURGERY     CESAREAN SECTION     x 2   COLONOSCOPY WITH PROPOFOL N/A 07/25/2022   Procedure: COLONOSCOPY WITH PROPOFOL;  Surgeon: Regis Bill, MD;  Location: [facility] ENDOSCOPY;  Service: Endoscopy;  Laterality: N/A;   EP IMPLANTABLE DEVICE N/A 08/31/2015   Procedure: Pacemaker Implant;  Surgeon: Will Jorja Loa, MD; [facility] Assurity DR  model [id number] (serial number  [id number] ); Laterality: Right   ESOPHAGOGASTRODUODENOSCOPY (EGD) WITH PROPOFOL N/A 04/04/2021   Procedure: ESOPHAGOGASTRODUODENOSCOPY (EGD) WITH PROPOFOL;  Surgeon: Wyline Mood, MD;  Location: [facility] ENDOSCOPY;  Service: Gastroenterology;  Laterality: N/A;   LAPAROSCOPY N/A 01/31/2020   Procedure: LAPAROSCOPY DIAGNOSTIC;  Surgeon: Sheliah Hatch De Blanch, MD;  Location: [facility] OR;  Service: General;  Laterality: N/A;   LAPAROTOMY N/A 01/31/2020   Procedure: Exploratory Laparotomy;  Surgeon: Sheliah Hatch De Blanch, MD;  Location: [facility] OR;  Service: General;  Laterality: N/A;   LEFT OOPHORECTOMY     LYSIS OF ADHESION  01/31/2020   Procedure: Lysis Of Adhesions, Ligation of Falciform Vein;  Surgeon: Kinsinger, De Blanch, MD;  Location: [facility] OR;  Service: General;;   MASTECTOMY Left    polyp      self inflicted chest wound     SPINE SURGERY  1981   lower back   TEE WITHOUT CARDIOVERSION N/A 08/30/2015   Procedure: TRANSESOPHAGEAL ECHOCARDIOGRAM (TEE);  Surgeon: Tonny Bollman, MD;  Location: [facility] OR;  Service: Open Heart Surgery;  Laterality: N/A;   TOTAL ABDOMINAL HYSTERECTOMY     TRANSCATHETER AORTIC VALVE REPLACEMENT, TRANSFEMORAL N/A 08/30/2015   Procedure:  TRANSCATHETER AORTIC VALVE REPLACEMENT, TRANSFEMORAL;  Surgeon: Tonny Bollman, MD;  Location: [facility] OR;  Service: Open Heart Surgery;  Laterality: N/A;    Social History   Socioeconomic History   Marital status: Married    Spouse name: Not on file   Number of children: Not on file   Years of education: Not on file   Highest education level: Not on file  Occupational History   Not on file  Tobacco Use   Smoking status: Never   Smokeless tobacco: Never  Vaping Use   Vaping Use: Never used  Substance and Sexual Activity   Alcohol use: No    Alcohol/week: 0.0 standard drinks of alcohol   Drug use: No   Sexual activity: Not Currently  Other Topics Concern   Not on file  Social History Narrative   ** Merged History Encounter **       Social Determinants of Health   Financial Resource Strain: Not on file  Food Insecurity: Not on file  Transportation Needs: Not on file  Physical Activity: Not on file  Stress: Not on file  Social Connections: Not on file  Intimate Partner Violence: Not on file    Family History  Problem Relation Age of Onset   Depression Sister    Depression Sister    Depression Sister    Hypertension Mother    Lung cancer Sister    Stroke Sister    Hypertension Sister    Pneumonia Brother    Heart attack Neg Hx     Allergies  Allergen Reactions   Tetracyclines & Related Anaphylaxis and Rash   Cephalexin Diarrhea   Sulfa Antibiotics Hives   Latex Rash    Review of Systems  Constitutional: Negative.   HENT: Negative.    Eyes: Negative.   Respiratory: Negative.  Negative for cough and shortness of breath.   Cardiovascular: Negative.  Negative for chest pain, palpitations and leg swelling.  Gastrointestinal:  Positive for abdominal pain, constipation and nausea. Negative for diarrhea, heartburn and vomiting.  Genitourinary: Negative.  Negative for dysuria and flank pain.  Musculoskeletal: Negative.  Negative for joint pain and myalgias.  Skin:  Negative.   Neurological: Negative.  Negative for dizziness and headaches.  Endo/Heme/Allergies: Negative.   Psychiatric/Behavioral: Negative.  Negative for depression and suicidal ideas. The patient is not nervous/anxious.        Objective:   BP 116/66   Pulse 60   Ht 5\' 2"  (1.575 m)   Wt 134 lb (60.8 kg)   SpO2 98%   BMI 24.51 kg/m   Vitals:   06/10/23 1408  BP: 116/66  Pulse: 60  Height: 5\' 2"  (1.575 m)  Weight: 134 lb (60.8 kg)  SpO2: 98%  BMI (Calculated): 24.5    Physical Exam Vitals and nursing note reviewed.  Constitutional:      Appearance: Normal appearance.  HENT:  Head: Normocephalic and atraumatic.     Nose: Nose normal.     Mouth/Throat:     Mouth: Mucous membranes are moist.     Pharynx: Oropharynx is clear.  Eyes:     Conjunctiva/sclera: Conjunctivae normal.     Pupils: Pupils are equal, round, and reactive to light.  Cardiovascular:     Rate and Rhythm: Normal rate and regular rhythm.     Pulses: Normal pulses.     Heart sounds: Normal heart sounds. No murmur heard. Pulmonary:     Effort: Pulmonary effort is normal.     Breath sounds: Normal breath sounds. No wheezing.  Abdominal:     General: Bowel sounds are normal.     Palpations: Abdomen is soft.     Tenderness: There is abdominal tenderness. There is no right CVA tenderness or left CVA tenderness.  Musculoskeletal:        General: Normal range of motion.     Cervical back: Normal range of motion.     Right lower leg: No edema.     Left lower leg: No edema.  Skin:    General: Skin is warm and dry.  Neurological:     General: No focal deficit present.     Mental Status: She is alert and oriented to person, place, and time.  Psychiatric:        Mood and Affect: Mood normal.        Behavior: Behavior normal.      No results found for any visits on 06/10/23.  Recent Results (from the past 2160 hour(s))  Lipase, blood     Status: None   Collection Time: 03/23/23 11:33 AM   Result Value Ref Range   Lipase 31 11 - 51 U/L    Comment: Performed at [facility], [address] [place], [place] [address]  Comprehensive metabolic panel     Status: Abnormal   Collection Time: 03/23/23 11:33 AM  Result Value Ref Range   Sodium 135 135 - 145 mmol/L   Potassium 4.0 3.5 - 5.1 mmol/L   Chloride 101 98 - 111 mmol/L   CO2 26 22 - 32 mmol/L   Glucose, Bld 112 (H) 70 - 99 mg/dL    Comment: Glucose reference range applies only to samples taken after fasting for at least 8 hours.   BUN 19 8 - 23 mg/dL   Creatinine, Ser 6.04 0.44 - 1.00 mg/dL   Calcium 9.3 8.9 - 54.0 mg/dL   Total Protein 6.9 6.5 - 8.1 g/dL   Albumin 3.7 3.5 - 5.0 g/dL   AST 18 15 - 41 U/L   ALT 8 0 - 44 U/L   Alkaline Phosphatase 46 38 - 126 U/L   Total Bilirubin 0.7 0.3 - 1.2 mg/dL   GFR, Estimated >98 >11 mL/min    Comment: (NOTE) Calculated using the CKD-EPI Creatinine Equation (2021)    Anion gap 8 5 - 15    Comment: Performed at [facility], [address] [place], [place] [address]  CBC     Status: Abnormal   Collection Time: 03/23/23 11:33 AM  Result Value Ref Range   WBC 7.0 4.0 - 10.5 K/uL   RBC 4.23 3.87 - 5.11 MIL/uL   Hemoglobin 10.5 (L) 12.0 - 15.0 g/dL   HCT 29.5 (L) 62.1 - 30.8 %   MCV 83.7 80.0 - 100.0 fL   MCH 24.8 (L) 26.0 - 34.0 pg   MCHC 29.7 (L) 30.0 - 36.0  g/dL   RDW 16.1 09.6 - 04.5 %   Platelets 272 150 - 400 K/uL   nRBC 0.0 0.0 - 0.2 %    Comment: Performed at [facility], [address] [place], [place] [address]  Urinalysis, Routine w reflex microscopic -Urine, Clean Catch     Status: Abnormal   Collection Time: 03/23/23  5:58 PM  Result Value Ref Range   Color, Urine STRAW (A) YELLOW   APPearance CLEAR (A) CLEAR   Specific Gravity, Urine 1.028 1.005 - 1.030   pH 7.0 5.0 - 8.0   Glucose, UA NEGATIVE NEGATIVE mg/dL   Hgb urine dipstick NEGATIVE NEGATIVE   Bilirubin Urine NEGATIVE NEGATIVE   Ketones, ur NEGATIVE NEGATIVE  mg/dL   Protein, ur NEGATIVE NEGATIVE mg/dL   Nitrite NEGATIVE NEGATIVE   Leukocytes,Ua NEGATIVE NEGATIVE    Comment: Performed at [facility], [address] [place], [place] [address]  Lipase, blood     Status: None   Collection Time: 03/30/23  5:56 PM  Result Value Ref Range   Lipase 29 11 - 51 U/L    Comment: Performed at [facility], [address] [place], [place] [address]  Comprehensive metabolic panel     Status: None   Collection Time: 03/30/23  5:56 PM  Result Value Ref Range   Sodium 135 135 - 145 mmol/L   Potassium 4.3 3.5 - 5.1 mmol/L   Chloride 100 98 - 111 mmol/L   CO2 24 22 - 32 mmol/L   Glucose, Bld 95 70 - 99 mg/dL    Comment: Glucose reference range applies only to samples taken after fasting for at least 8 hours.   BUN 20 8 - 23 mg/dL   Creatinine, Ser 2.13 0.44 - 1.00 mg/dL   Calcium 9.4 8.9 - 08.6 mg/dL   Total Protein 7.1 6.5 - 8.1 g/dL   Albumin 4.1 3.5 - 5.0 g/dL   AST 15 15 - 41 U/L   ALT 8 0 - 44 U/L   Alkaline Phosphatase 48 38 - 126 U/L   Total Bilirubin 0.8 0.3 - 1.2 mg/dL   GFR, Estimated >57 >84 mL/min    Comment: (NOTE) Calculated using the CKD-EPI Creatinine Equation (2021)    Anion gap 11 5 - 15    Comment: Performed at [facility], [address] [place], [place] [address]  CBC     Status: Abnormal   Collection Time: 03/30/23  5:56 PM  Result Value Ref Range   WBC 7.6 4.0 - 10.5 K/uL   RBC 4.33 3.87 - 5.11 MIL/uL   Hemoglobin 10.7 (L) 12.0 - 15.0 g/dL   HCT 52.8 (L) 41.3 - 24.4 %   MCV 82.2 80.0 - 100.0 fL   MCH 24.7 (L) 26.0 - 34.0 pg   MCHC 30.1 30.0 - 36.0 g/dL   RDW 01.0 27.2 - 53.6 %   Platelets 288 150 - 400 K/uL   nRBC 0.0 0.0 - 0.2 %    Comment: Performed at [facility], [address] [place], [place] [address]  Urinalysis, Routine w reflex microscopic -Urine, Clean Catch     Status: Abnormal   Collection Time: 03/30/23  5:56 PM  Result Value Ref Range   Color, Urine YELLOW  (A) YELLOW   APPearance HAZY (A) CLEAR   Specific Gravity, Urine 1.009 1.005 - 1.030   pH 7.0 5.0 - 8.0   Glucose, UA NEGATIVE NEGATIVE mg/dL   Hgb urine dipstick NEGATIVE NEGATIVE  Bilirubin Urine NEGATIVE NEGATIVE   Ketones, ur NEGATIVE NEGATIVE mg/dL   Protein, ur NEGATIVE NEGATIVE mg/dL   Nitrite NEGATIVE NEGATIVE   Leukocytes,Ua LARGE (A) NEGATIVE   RBC / HPF 6-10 0 - 5 RBC/hpf   WBC, UA 21-50 0 - 5 WBC/hpf   Bacteria, UA MANY (A) NONE SEEN   Squamous Epithelial / HPF 11-20 0 - 5 /HPF   Mucus PRESENT     Comment: Performed at [facility], [address], [place], [place] [address]  Urine Culture     Status: Abnormal   Collection Time: 03/30/23  5:56 PM   Specimen: Urine, Clean Catch  Result Value Ref Range   Specimen Description      URINE, CLEAN CATCH Performed at [facility], [address] [place], [place] [address]    Special Requests      NONE Performed at [facility], [address], [place], [place] [address]    Culture (A)     10,000 COLONIES/mL MULTIPLE SPECIES PRESENT, SUGGEST RECOLLECTION   Report Status 04/01/2023 FINAL   CUP PACEART REMOTE DEVICE CHECK     Status: None   Collection Time: 04/11/23  2:00 AM  Result Value Ref Range   Date Time Interrogation Session [id number]    Pulse Generator Manufacturer SJCR    Pulse Gen Model 2240 Assurity    Pulse Gen Serial Number [id number]    Clinic Name [facility]    Implantable Pulse Generator Type Implantable Pulse Generator    Implantable Pulse Generator Implant Date [id number]    Implantable Lead Manufacturer [facility]    Implantable Lead Model [id number] Tendril STS    Implantable Lead Serial Number [id number]    Implantable Lead Implant Date [id number]    Implantable Lead Location Detail 1 UNKNOWN    Implantable Lead Location [id number]    Implantable Lead Connection Status [id number]    Implantable Lead Manufacturer [facility]    Implantable Lead Model [phone] Tendril STS    Implantable  Lead Serial Number [id number]    Implantable Lead Implant Date 02725366    Implantable Lead Location Detail 1 APEX    Implantable Lead Location [id number]    Implantable Lead Connection Status [id number]    Lead Channel Setting Sensing Sensitivity 2.5 mV   Lead Channel Setting Sensing Adaptation Mode Fixed Pacing    Lead Channel Setting Pacing Amplitude 1.75 V   Lead Channel Setting Pacing Pulse Width 0.4 ms   Lead Channel Setting Pacing Amplitude 0.875    Lead Channel Status NULL    Lead Channel Impedance Value 390 ohm   Lead Channel Sensing Intrinsic Amplitude 3.2 mV   Lead Channel Pacing Threshold Amplitude 0.75 V   Lead Channel Pacing Threshold Pulse Width 0.4 ms   Lead Channel Status NULL    Lead Channel Impedance Value 550 ohm   Lead Channel Sensing Intrinsic Amplitude 5.7 mV   Lead Channel Pacing Threshold Amplitude 0.625 V   Lead Channel Pacing Threshold Pulse Width 0.4 ms   Battery Status MOS    Battery Remaining Longevity 36 mo   Battery Remaining Percentage 29.0 %   Battery Voltage 2.92 V   Brady Statistic RA Percent Paced 18.0 %   Brady Statistic RV Percent Paced 95.0 %   Brady Statistic AP VP Percent 15.0 %   Brady Statistic AS VP Percent 79.0 %   Brady Statistic AP VS Percent 2.8 %   Brady Statistic AS VS Percent 2.1 %  CUP PACEART INCLINIC DEVICE  CHECK     Status: None   Collection Time: 05/02/23  3:22 PM  Result Value Ref Range   Date Time Interrogation Session [id number]    Pulse Generator Manufacturer SJCR    Pulse Gen Model 2240 Assurity    Pulse Gen Serial Number [id number]    Clinic Name [facility]    Implantable Pulse Generator Type Implantable Pulse Generator    Implantable Pulse Generator Implant Date [id number]    Implantable Lead Manufacturer [facility]    Implantable Lead Model [phone] Tendril STS    Implantable Lead Serial Number [id number]    Implantable Lead Implant Date [id number]    Implantable Lead Location Detail 1 UNKNOWN    Implantable Lead  Location [id number]    Implantable Lead Connection Status [id number]    Implantable Lead Manufacturer [facility]    Implantable Lead Model [phone] Tendril STS    Implantable Lead Serial Number [id number]    Implantable Lead Implant Date 33295188    Implantable Lead Location Detail 1 APEX    Implantable Lead Location [id number]    Implantable Lead Connection Status [id number]    Lead Channel Setting Sensing Sensitivity 2.5 mV   Lead Channel Setting Pacing Amplitude 1.75 V   Lead Channel Setting Pacing Pulse Width 0.4 ms   Lead Channel Setting Pacing Amplitude 0.875    Lead Channel Impedance Value 400.0 ohm   Lead Channel Sensing Intrinsic Amplitude 3.4 mV   Lead Channel Pacing Threshold Amplitude 0.75 V   Lead Channel Pacing Threshold Pulse Width 0.4 ms   Lead Channel Pacing Threshold Amplitude 0.75 V   Lead Channel Pacing Threshold Pulse Width 0.4 ms   Lead Channel Impedance Value 562.5 ohm   Lead Channel Sensing Intrinsic Amplitude 5.1 mV   Lead Channel Pacing Threshold Amplitude 0.75 V   Lead Channel Pacing Threshold Pulse Width 0.4 ms   Lead Channel Pacing Threshold Amplitude 0.75 V   Lead Channel Pacing Threshold Pulse Width 0.4 ms   Battery Status Unknown    Battery Remaining Longevity 36 mo   Battery Voltage 2.92 V   Brady Statistic RA Percent Paced 19.0 %   Brady Statistic RV Percent Paced 94.0 %  BLADDER SCAN AMB NON-IMAGING     Status: None   Collection Time: 05/02/23  3:26 PM  Result Value Ref Range   Scan Result 0 ml  Basic metabolic panel     Status: Abnormal   Collection Time: 05/18/23 11:59 AM  Result Value Ref Range   Sodium 136 135 - 145 mmol/L   Potassium 4.0 3.5 - 5.1 mmol/L   Chloride 101 98 - 111 mmol/L   CO2 24 22 - 32 mmol/L   Glucose, Bld 91 70 - 99 mg/dL    Comment: Glucose reference range applies only to samples taken after fasting for at least 8 hours.   BUN 24 (H) 8 - 23 mg/dL   Creatinine, Ser 4.16 0.44 - 1.00 mg/dL   Calcium 9.3 8.9 - 60.6 mg/dL   GFR, Estimated >30  >16 mL/min    Comment: (NOTE) Calculated using the CKD-EPI Creatinine Equation (2021)    Anion gap 11 5 - 15    Comment: Performed at [facility], [address] [place], [place] [address]  CBC     Status: Abnormal   Collection Time: 05/18/23 11:59 AM  Result Value Ref Range   WBC 7.5 4.0 - 10.5 K/uL   RBC 4.25 3.87 - 5.11 MIL/uL   Hemoglobin 10.5 (L) 12.0 -  15.0 g/dL   HCT 16.1 (L) 09.6 - 04.5 %   MCV 81.4 80.0 - 100.0 fL   MCH 24.7 (L) 26.0 - 34.0 pg   MCHC 30.3 30.0 - 36.0 g/dL   RDW 40.9 81.1 - 91.4 %   Platelets 302 150 - 400 K/uL   nRBC 0.0 0.0 - 0.2 %    Comment: Performed at [facility], [address] [place], [place] [address]  Troponin I (High Sensitivity)     Status: None   Collection Time: 05/18/23 11:59 AM  Result Value Ref Range   Troponin I (High Sensitivity) 7 <18 ng/L    Comment: (NOTE) Elevated high sensitivity troponin I (hsTnI) values and significant  changes across serial measurements may suggest ACS but many other  chronic and acute conditions are known to elevate hsTnI results.  Refer to the "Links" section for chest pain algorithms and additional  guidance. Performed at [facility], [address] [place], [place] [address]   Type and screen [facility]     Status: None   Collection Time: 05/18/23 11:59 AM  Result Value Ref Range   ABO/RH(D) A POS    Antibody Screen NEG    Sample Expiration      05/21/2023,2359 Performed at [facility], [address] [place], [place] [address]   Hepatic function panel     Status: None   Collection Time: 05/18/23 11:59 AM  Result Value Ref Range   Total Protein 7.2 6.5 - 8.1 g/dL   Albumin 4.0 3.5 - 5.0 g/dL   AST 17 15 - 41 U/L   ALT 8 0 - 44 U/L   Alkaline Phosphatase 49 38 - 126 U/L   Total Bilirubin 0.7 0.3 - 1.2 mg/dL   Bilirubin, Direct <4.6 0.0 - 0.2 mg/dL   Indirect Bilirubin NOT CALCULATED 0.3 - 0.9 mg/dL    Comment: Performed at  [facility], [address] [place], [place] [address]  Lipase, blood     Status: None   Collection Time: 05/18/23 11:59 AM  Result Value Ref Range   Lipase 33 11 - 51 U/L    Comment: Performed at [facility], [address] [place], [place] [address]  Troponin I (High Sensitivity)     Status: None   Collection Time: 05/18/23  2:01 PM  Result Value Ref Range   Troponin I (High Sensitivity) 10 <18 ng/L    Comment: (NOTE) Elevated high sensitivity troponin I (hsTnI) values and significant  changes across serial measurements may suggest ACS but many other  chronic and acute conditions are known to elevate hsTnI results.  Refer to the "Links" section for chest pain algorithms and additional  guidance. Performed at [facility], [address] [place], [place] [address]   D-dimer, quantitative     Status: Abnormal   Collection Time: 05/18/23  3:02 PM  Result Value Ref Range   D-Dimer, Quant 1.40 (H) 0.00 - 0.50 ug/mL-FEU    Comment: (NOTE) At the manufacturer cut-off value of 0.5 g/mL FEU, this assay has a negative predictive value of 95-100%.This assay is intended for use in conjunction with a clinical pretest probability (PTP) assessment model to exclude pulmonary embolism (PE) and deep venous thrombosis (DVT) in outpatients suspected of PE or DVT. Results should be correlated with clinical presentation. Performed at [facility], [address], [place], [place] [address]       Assessment & Plan:  Return for  fasting labs. Schedule appointment with GI to discuss polyp removal, chronic constipation.   Problem List Items Addressed This Visit       Cardiovascular and Mediastinum   Essential hypertension - Primary (Chronic)   Relevant Orders   CBC With Diff/Platelet   CMP14+EGFR     Endocrine   Hypothyroidism (Chronic)   Relevant Orders   CBC With Diff/Platelet   TSH     Other   Hyperlipidemia   Relevant Orders   Lipid  Profile   Other Visit Diagnoses     Diabetes mellitus without complication (HCC)       Relevant Orders   HgB A1c   Colon cancer screening       Relevant Orders   Ambulatory referral to Gastroenterology       Return in about 4 weeks (around 07/08/2023).   Total time spent: 30 minutes  [employer], NP  06/10/2023   This document may have been prepared by Dragon Voice Recognition software and as such may include unintentional dictation errors.

## 2023-06-13 ENCOUNTER — Ambulatory Visit (HOSPITAL_COMMUNITY): Payer: Medicare Other | Admitting: Psychiatry

## 2023-06-17 NOTE — Addendum Note (Signed)
Addended by: Margaretann Loveless on: 06/17/2023 10:10 AM   Modules accepted: Orders

## 2023-06-23 ENCOUNTER — Other Ambulatory Visit: Payer: Self-pay | Admitting: Nurse Practitioner

## 2023-06-27 ENCOUNTER — Ambulatory Visit (HOSPITAL_COMMUNITY): Payer: Medicare Other | Admitting: Psychiatry

## 2023-07-02 ENCOUNTER — Ambulatory Visit: Payer: Medicare Other | Admitting: Physician Assistant

## 2023-07-02 VITALS — BP 133/63 | HR 69 | Ht 62.0 in | Wt 134.0 lb

## 2023-07-02 DIAGNOSIS — R339 Retention of urine, unspecified: Secondary | ICD-10-CM | POA: Diagnosis not present

## 2023-07-02 LAB — BLADDER SCAN AMB NON-IMAGING: Scan Result: 0

## 2023-07-02 NOTE — Progress Notes (Signed)
07/02/2023 2:13 PM   Catherine [name] 1941/03/17 [id number]  CC: Chief Complaint  Patient presents with   Follow-up   Urinary Retention   HPI: Catherine [name] is a 82 y.o. female with PMH OAB and recurrent UTI with a recent history of urinary retention in the setting of constipation who passed a voiding trial with [place] in late May who presents today for symptom recheck and PVR.  She is accompanied today by her husband.  Today she reports she has continued to void.  She admits to some occasional difficulty voiding, but none currently.  No acute concerns today.  PVR 0 mL.  PMH: Past Medical History:  Diagnosis Date   Acute colitis    Anemia    CAD (coronary artery disease)    a. heavy calcification of the entire LAD & mod eccentric mid LAD stenosis, estimated @ 70%, mild nonobs stenosis of RCA and LCx, severely calcified Ao valve with restricted valve mobility and 2+ AI     Cancer (HCC) 1981   breast   CHB (complete heart block) (HCC) 08/31/2015   [employer] DR  model [id number] (serial number  [id number] )    Chronic diastolic congestive heart failure (HCC)    a. echo 08/31/2015: EF 65-70%, nl WM, GR1DD, Ao valve stent bioprosthesis was present and functioning nl, no regurg, LA mildly dilated, PASP 46 mm Hg   Collagen vascular disease (HCC)    Colon polyps    Depression    Diabetes mellitus type II    Fibromyalgia    HTN (hypertension)    Hypercholesteremia    Hypothyroidism    IBS (irritable bowel syndrome)    Presence of permanent cardiac pacemaker    Rectal bleeding    Rheumatoid arthritis(714.0)    S/P TAVR (transcatheter aortic valve replacement) 08/30/2015   26 mm Edwards Sapien 3 transcatheter heart valve placed via open right transfemoral approach   Shortness of breath dyspnea    Stenosis of aortic valve    Suicide attempt (HCC)    Thyroid disease     Surgical History: Past Surgical History:  Procedure Laterality Date   ABDOMINAL SURGERY     Intestinal  surgery   CARDIAC SURGERY     CESAREAN SECTION     x 2   COLONOSCOPY WITH PROPOFOL N/A 07/25/2022   Procedure: COLONOSCOPY WITH PROPOFOL;  Surgeon: Regis Bill, MD;  Location: [facility] ENDOSCOPY;  Service: Endoscopy;  Laterality: N/A;   EP IMPLANTABLE DEVICE N/A 08/31/2015   Procedure: Pacemaker Implant;  Surgeon: Will Jorja Loa, MD; [facility] [employer] [facility]  model [phone] (serial number  [id number] ); Laterality: Right   ESOPHAGOGASTRODUODENOSCOPY (EGD) WITH PROPOFOL N/A 04/04/2021   Procedure: ESOPHAGOGASTRODUODENOSCOPY (EGD) WITH PROPOFOL;  Surgeon: Wyline Mood, MD;  Location[facility] ENDOSCOPY;  Service: Gastroenterology;  Laterality: N/A;   LAPAROSCOPY N/A 01/31/2020   Procedure: LAPAROSCOPY DIAGNOSTIC;  Surgeon: Sheliah Hatch De Blanch, MD;  [facility];  Service: General;  Laterality: N/A;   LAPAROTOMY N/A 01/31/2020   Procedure: Exploratory Laparotomy;  Surgeon: Sheliah Hatch De Blanch, MD;  Location: [facility] OR;  Service: General;  Laterality: N/A;   LEFT OOPHORECTOMY     LYSIS OF ADHESION  01/31/2020   Procedure: Lysis Of Adhesions, Ligation of Falciform Vein;  Surgeon: Kinsinger, De Blanch, MD;  Location: [facility] OR;  Service: General;;   MASTECTOMY Left    polyp      self inflicted chest wound     SPINE SURGERY  1981  lower back   TEE WITHOUT CARDIOVERSION N/A 08/30/2015   Procedure: TRANSESOPHAGEAL ECHOCARDIOGRAM (TEE);  Surgeon: Tonny Bollman, MD;  [facility] OR;  Service: Open Heart Surgery;  Laterality: N/A;   TOTAL ABDOMINAL HYSTERECTOMY     TRANSCATHETER AORTIC VALVE REPLACEMENT, TRANSFEMORAL N/A 08/30/2015   Procedure: TRANSCATHETER AORTIC VALVE REPLACEMENT, TRANSFEMORAL;  Surgeon: Tonny Bollman, MD;  Location: [facility] OR;  Service: Open Heart Surgery;  Laterality: N/A;    Home Medications:  Allergies as of 07/02/2023       Reactions   Tetracyclines & Related Anaphylaxis, Rash   Cephalexin Diarrhea   Sulfa Antibiotics Hives   Latex Rash        Medication List         Accurate as of July 02, 2023  2:13 PM. If you have any questions, ask your nurse or doctor.          acetaminophen 325 MG tablet Commonly known as: Tylenol Take 2 tablets (650 mg total) by mouth every 4 (four) hours as needed.   aspirin EC 81 MG tablet Take 1 tablet (81 mg total) by mouth daily. Swallow whole. Hold this medication until you see your PCP and/or cardiologist   atorvastatin 40 MG tablet Commonly known as: LIPITOR TAKE 1 TABLET BY MOUTH IN THE  EVENING FOR HIGH CHOLESTEROL   clonazePAM 0.5 MG tablet Commonly known as: KLONOPIN Take 1 tablet (0.5 mg total) by mouth at bedtime.   ezetimibe 10 MG tablet Commonly known as: ZETIA Take 1 tablet (10 mg total) by mouth daily.   famotidine 40 MG tablet Commonly known as: PEPCID Take 40 mg by mouth daily.   isosorbide mononitrate 30 MG 24 hr tablet Commonly known as: IMDUR TAKE 0.5 TABLETS BY MOUTH DAILY. PLEASE KEEP APPOINTMENT ON 04/01/23 FOR FURTHER REFILLS. THANK YOU.   levothyroxine 112 MCG tablet Commonly known as: SYNTHROID TAKE 1 TABLET BY MOUTH IN THE  MORNING ON AN EMPTY STOMACH 1  HOUR BEFORE OTHER MEDICATIONS OR FOOD   losartan 25 MG tablet Commonly known as: COZAAR Take 1/2 tablet (12.5 mg total) by mouth daily.   metoprolol tartrate 25 MG tablet Commonly known as: LOPRESSOR Take 1/2 tablet (12.5 mg total) by mouth 2 (two) times daily.   multivitamin with minerals Tabs tablet Take 1 tablet by mouth daily.   nitroGLYCERIN 0.4 MG SL tablet Commonly known as: NITROSTAT Place 1 tablet (0.4 mg total) under the tongue every 5 (five) minutes as needed for chest pain.   oxyCODONE 5 MG immediate release tablet Commonly known as: Roxicodone Take 1 tablet (5 mg total) by mouth every 6 (six) hours as needed for up to 8 doses for severe pain or breakthrough pain.   pantoprazole 40 MG tablet Commonly known as: PROTONIX Take 40 mg by mouth daily.   polyethylene glycol powder 17 GM/SCOOP powder Commonly  known as: GLYCOLAX/MIRALAX Take 17 g by mouth daily.   potassium chloride 10 MEQ tablet Commonly known as: KLOR-CON M TAKE 1 TABLET BY MOUTH EVERY  OTHER DAY   sertraline 100 MG tablet Commonly known as: ZOLOFT Take 1 tablet (100 mg total) by mouth daily.   traZODone 50 MG tablet Commonly known as: DESYREL Take 1 tablet (50 mg total) by mouth at bedtime.   Zinc Oxide 12.8 % ointment Commonly known as: TRIPLE PASTE Apply topically 4 (four) times daily.        Allergies:  Allergies  Allergen Reactions   Tetracyclines & Related Anaphylaxis and Rash   Cephalexin  Diarrhea   Sulfa Antibiotics Hives   Latex Rash    Family History: Family History  Problem Relation Age of Onset   Depression Sister    Depression Sister    Depression Sister    Hypertension Mother    Lung cancer Sister    Stroke Sister    Hypertension Sister    Pneumonia Brother    Heart attack Neg Hx     Social History:   reports that she has never smoked. She has never used smokeless tobacco. She reports that she does not drink alcohol and does not use drugs.  Physical Exam: BP 133/63   Pulse 69   Ht 5\' 2"  (1.575 m)   Wt 134 lb (60.8 kg)   BMI 24.51 kg/m   Constitutional:  Alert and oriented, no acute distress, nontoxic appearing HEENT: [place], AT Cardiovascular: No clubbing, cyanosis, or edema Respiratory: Normal respiratory effort, no increased work of breathing Skin: No rashes, bruises or suspicious lesions Neurologic: Grossly intact, no focal deficits, moving all 4 extremities Psychiatric: Normal mood and affect  Laboratory Data: Results for orders placed or performed in visit on 07/02/23  BLADDER SCAN AMB NON-IMAGING  Result Value Ref Range   Scan Result 0 ml    Assessment & Plan:   1. Urinary retention PVR WNL, no acute concerns. OK to follow up as needed. - BLADDER SCAN AMB NON-IMAGING  Return if symptoms worsen or fail to improve.  Carman Ching, PA-C  [facility]  [facility] [place] [address], [address] [place], [place] [address] [phone]

## 2023-07-06 ENCOUNTER — Other Ambulatory Visit (HOSPITAL_COMMUNITY): Payer: Self-pay | Admitting: Psychiatry

## 2023-07-06 DIAGNOSIS — F419 Anxiety disorder, unspecified: Secondary | ICD-10-CM

## 2023-07-06 DIAGNOSIS — F331 Major depressive disorder, recurrent, moderate: Secondary | ICD-10-CM

## 2023-07-08 ENCOUNTER — Telehealth (HOSPITAL_COMMUNITY): Payer: Self-pay | Admitting: *Deleted

## 2023-07-08 DIAGNOSIS — F331 Major depressive disorder, recurrent, moderate: Secondary | ICD-10-CM

## 2023-07-08 DIAGNOSIS — F419 Anxiety disorder, unspecified: Secondary | ICD-10-CM

## 2023-07-08 MED ORDER — TRAZODONE HCL 50 MG PO TABS: 50.0000 mg | ORAL_TABLET | Freq: Every day | ORAL | 0 refills | Status: DC

## 2023-07-08 MED ORDER — SERTRALINE HCL 100 MG PO TABS: 100.0000 mg | ORAL_TABLET | Freq: Every day | ORAL | 0 refills | Status: DC

## 2023-07-08 MED ORDER — CLONAZEPAM 0.5 MG PO TABS
0.5000 mg | ORAL_TABLET | Freq: Every day | ORAL | 1 refills | Status: AC
Start: 2023-07-08 — End: ?

## 2023-07-08 NOTE — Telephone Encounter (Signed)
Send enough medication to keep the appointment.  She must came for her appointment otherwise we may have to refer her.

## 2023-07-08 NOTE — Telephone Encounter (Signed)
Pt's daughter called requesting refills of both the Trazodone and Klonopin. Both medications last ordered on last visit 12/13/22. Pt has an upcoming appointment scheduled for 08/22/23. Please review and advise.

## 2023-07-09 NOTE — Telephone Encounter (Signed)
Will do!

## 2023-07-11 ENCOUNTER — Ambulatory Visit (INDEPENDENT_AMBULATORY_CARE_PROVIDER_SITE_OTHER): Payer: Medicare Other

## 2023-07-11 DIAGNOSIS — I442 Atrioventricular block, complete: Secondary | ICD-10-CM | POA: Diagnosis not present

## 2023-07-11 LAB — CUP PACEART REMOTE DEVICE CHECK
Battery Remaining Longevity: 34 mo
Battery Remaining Percentage: 27 %
Battery Voltage: 2.92 V
Brady Statistic AP VP Percent: 16 %
Brady Statistic AP VS Percent: 34 %
Brady Statistic AS VP Percent: 9.1 %
Brady Statistic AS VS Percent: 41 %
Brady Statistic RA Percent Paced: 48 %
Brady Statistic RV Percent Paced: 25 %
Date Time Interrogation Session: 20240808020018
Implantable Lead Connection Status: 753985
Implantable Lead Connection Status: 753985
Implantable Lead Implant Date: 20160928
Implantable Lead Implant Date: 20160928
Implantable Lead Location: 753859
Implantable Lead Location: 753860
Implantable Pulse Generator Implant Date: 20160928
Lead Channel Impedance Value: 400 Ohm
Lead Channel Impedance Value: 550 Ohm
Lead Channel Pacing Threshold Amplitude: 0.75 V
Lead Channel Pacing Threshold Amplitude: 0.875 V
Lead Channel Pacing Threshold Pulse Width: 0.4 ms
Lead Channel Pacing Threshold Pulse Width: 0.4 ms
Lead Channel Sensing Intrinsic Amplitude: 3.8 mV
Lead Channel Sensing Intrinsic Amplitude: 5.7 mV
Lead Channel Setting Pacing Amplitude: 1 V
Lead Channel Setting Pacing Amplitude: 1.875
Lead Channel Setting Pacing Pulse Width: 0.4 ms
Lead Channel Setting Sensing Sensitivity: 2 mV
Pulse Gen Model: 2240
Pulse Gen Serial Number: 7779295

## 2023-07-15 ENCOUNTER — Ambulatory Visit: Payer: Medicare Other | Admitting: Cardiology

## 2023-07-15 ENCOUNTER — Other Ambulatory Visit: Payer: Self-pay | Admitting: Family

## 2023-07-26 ENCOUNTER — Telehealth: Payer: Self-pay | Admitting: Nurse Practitioner

## 2023-07-26 NOTE — Telephone Encounter (Signed)
Tonya at [facility] left VM that the patient's levothyroxine 112 mcg is switching manufacturers from [employer] to [place] and they need the okay to switch before they can fill the patient's Rx. Please advise.  Callback # [phone] Reference # [id number]

## 2023-08-22 ENCOUNTER — Ambulatory Visit (HOSPITAL_COMMUNITY): Payer: Medicare Other | Admitting: Psychiatry

## 2023-08-24 IMAGING — CR DG CHEST 2V
2 series · 2 of 2 positions shown · non-contrast
Comparison: Chest x-ray 01/31/2020.

CLINICAL DATA: 80-year-old female with suspected sepsis. Abdominal
pain.

EXAM:
CHEST - 2 VIEW

[chest lat]
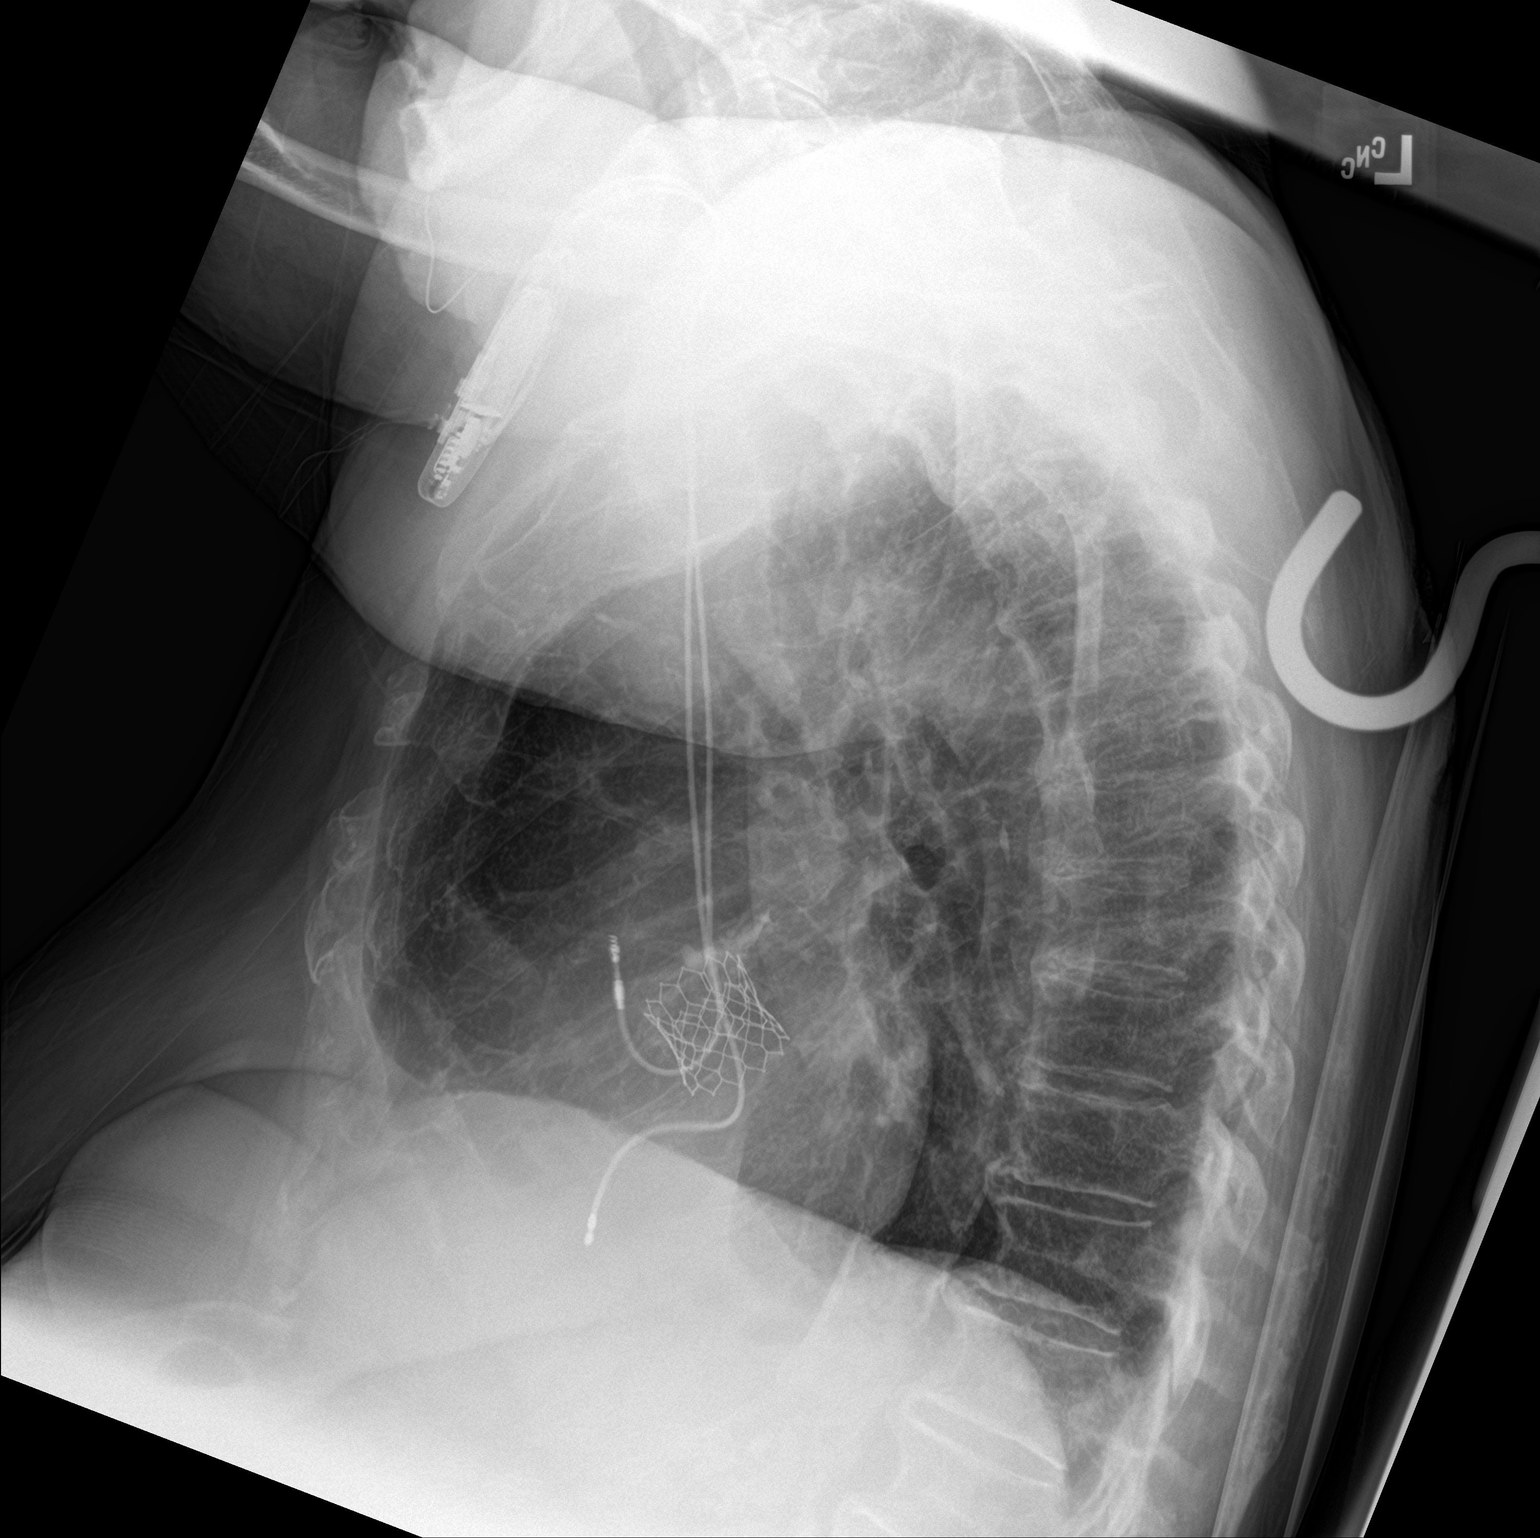

[chest ap]
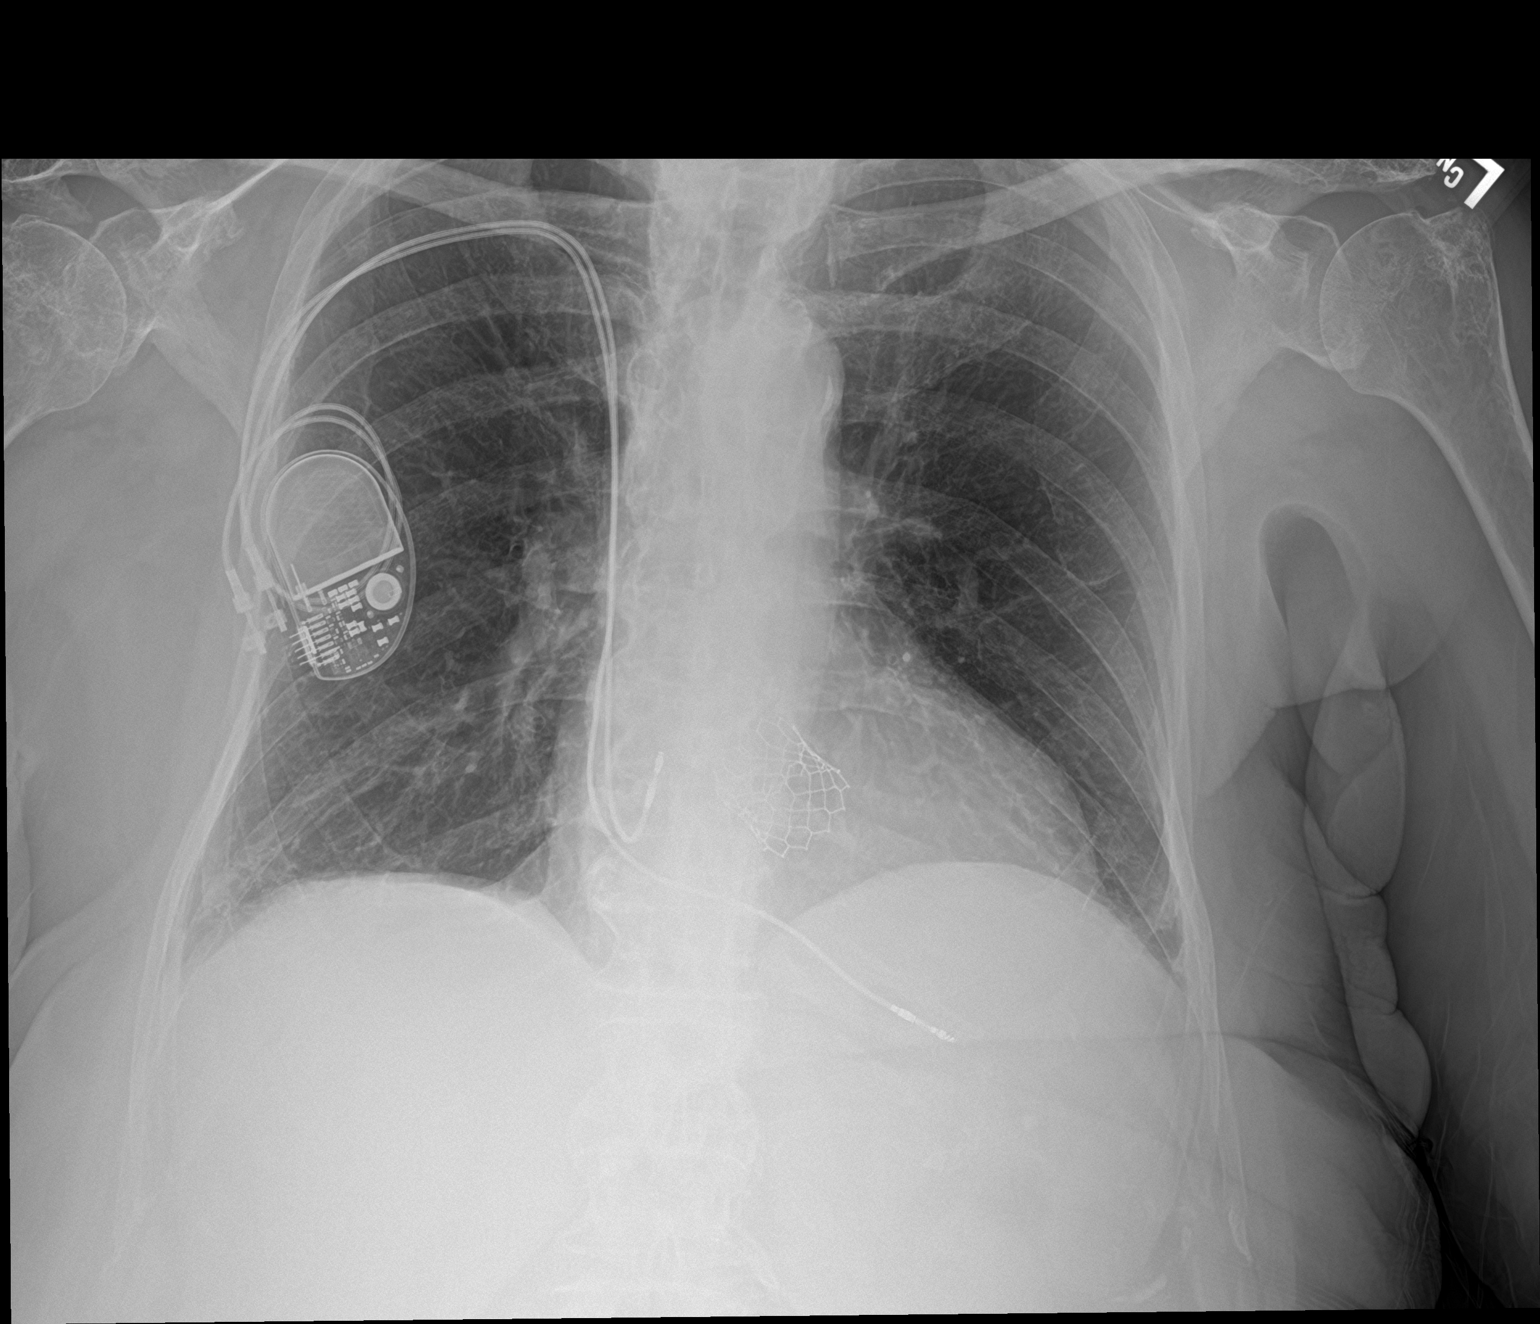

[2 of 2 positions shown; findings below may reference images not displayed]

FINDINGS: Lung volumes are normal. No consolidative airspace disease. No
pleural effusions. No pneumothorax. No pulmonary nodule or mass
noted. Pulmonary vasculature and the cardiomediastinal silhouette
are within normal limits. Atherosclerosis in the thoracic aorta.
Status post TAVR. Right-sided pacemaker device in place with lead
tips projecting over the expected location of the right atrium and
right ventricle.
IMPRESSION: 1. No radiographic evidence of acute cardiopulmonary disease.
2. Aortic atherosclerosis.
3. Postprocedural changes and support apparatus, as above.

## 2023-08-24 IMAGING — CT CT ABD-PELV W/ CM
2 of 5 series · 15 of 46 positions shown, 17 images · IV contrast (APPLIED)
Comparison: None.

CLINICAL DATA: LLQ abdominal pain^100mL OMNIPAQUE IOHEXOL 300
MG/ML SOLNLLQ abdominal pain

EXAM:
CT ABDOMEN AND PELVIS WITH CONTRAST
TECHNIQUE: Multidetector CT imaging of the abdomen and pelvis was performed
using the standard protocol following bolus administration of
intravenous contrast.

[Series 2: axial st · axial · 0.87mm/px · z∈[-1173,-723]mm · 12 of 102 slices shown, 14 images]
[im 6/102  soft-tissue]
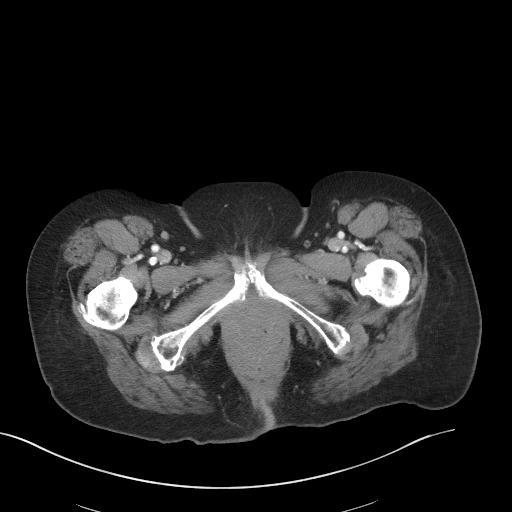
[im 6/102  bone]
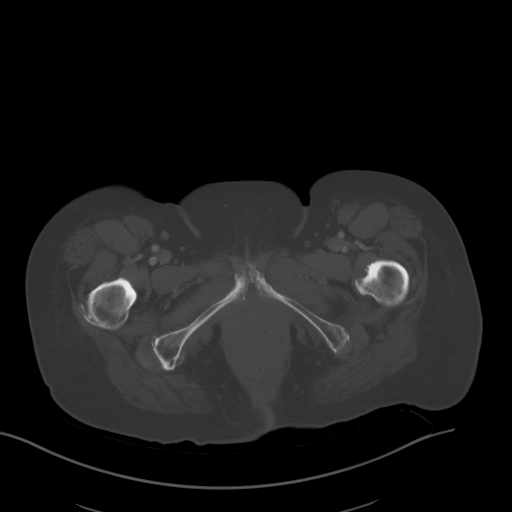
[im 17/102  soft-tissue]
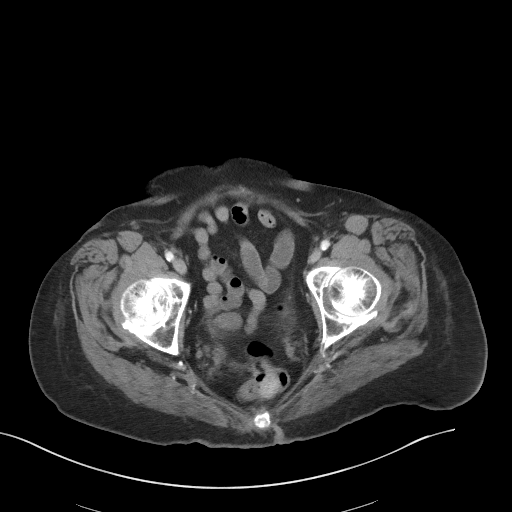
[im 23/102  soft-tissue]
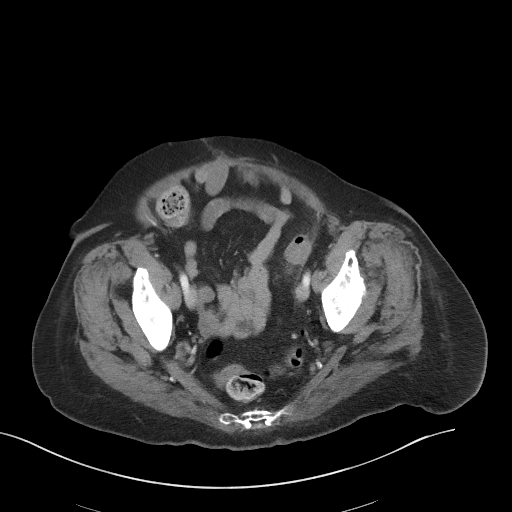
[im 29/102  soft-tissue]
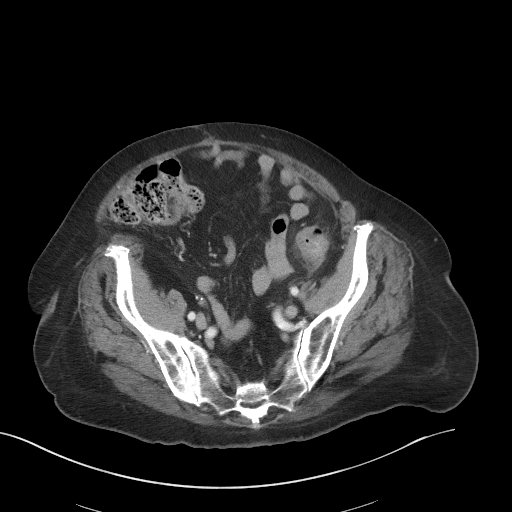
[im 40/102  soft-tissue]
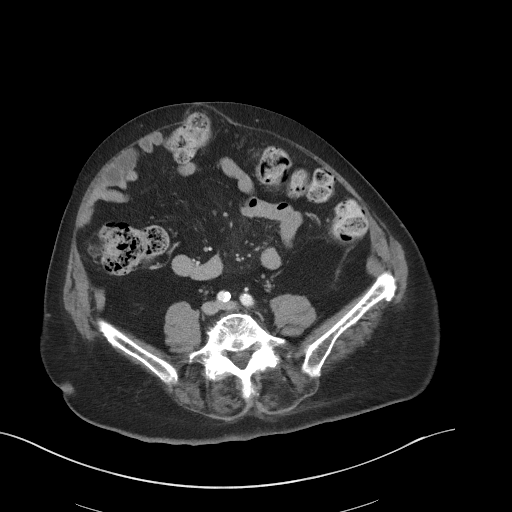
[im 45/102  soft-tissue]
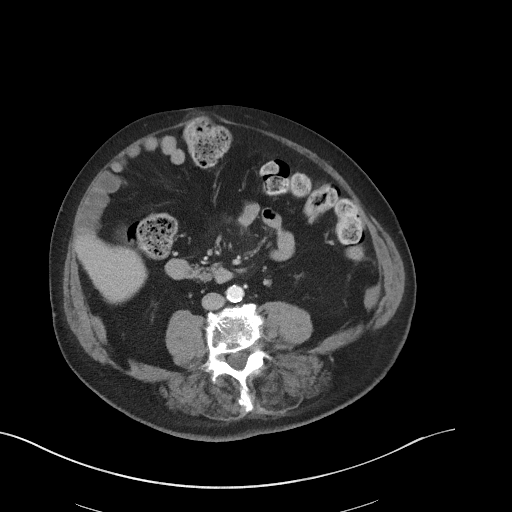
[im 57/102  soft-tissue]
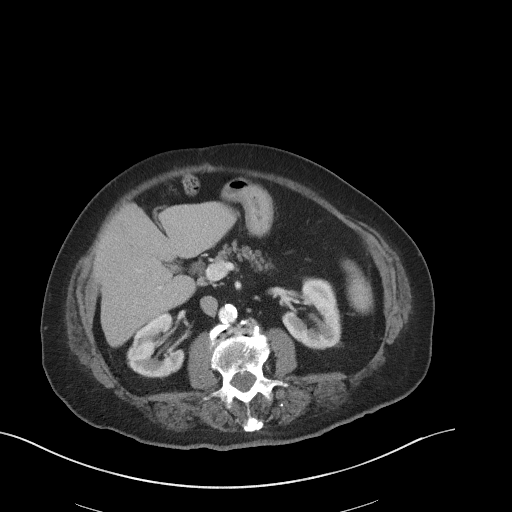
[im 62/102  soft-tissue]
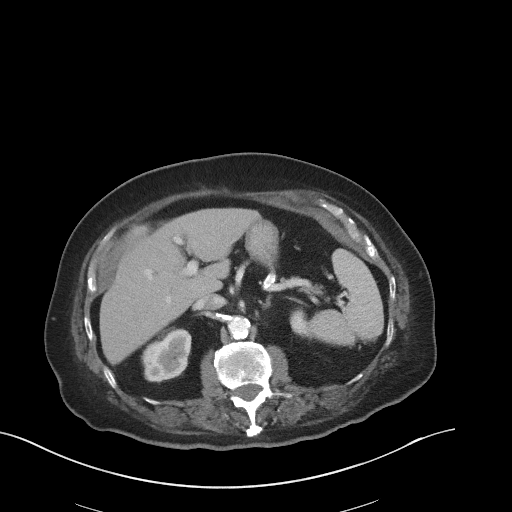
[im 73/102  soft-tissue]
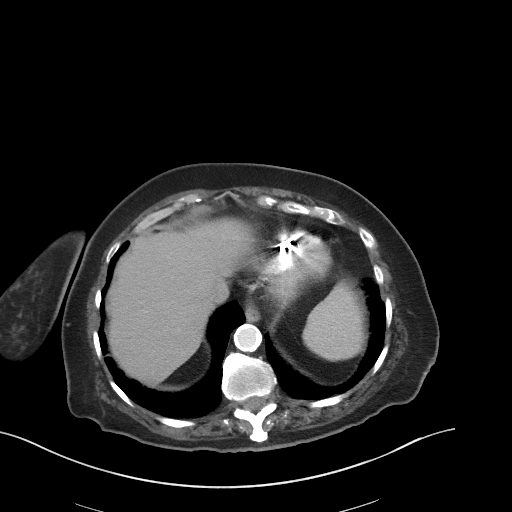
[im 73/102  bone]
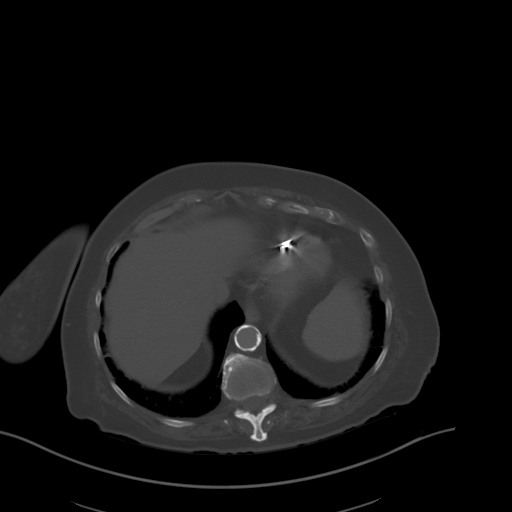
[im 79/102  soft-tissue]
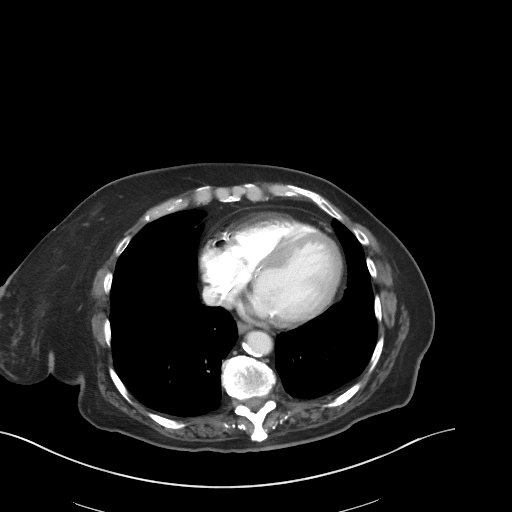
[im 85/102  soft-tissue]
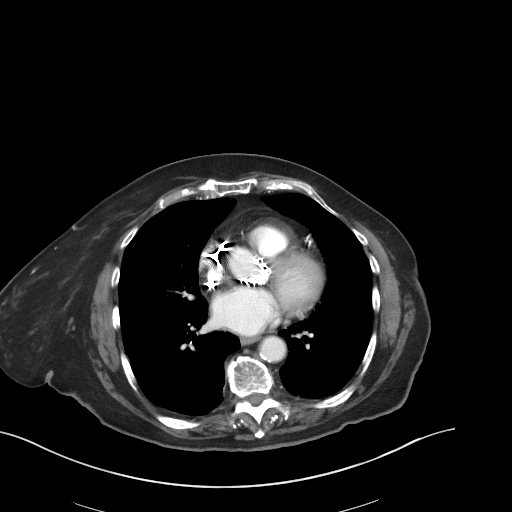
[im 96/102  soft-tissue]
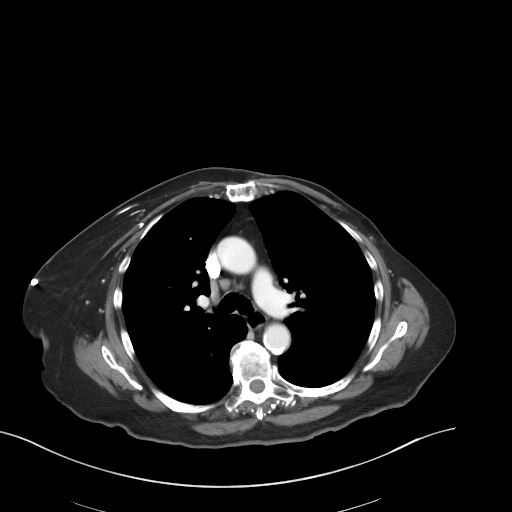

[Series 5: coronal st · coronal · 0.69mm/px · 3 of 103 slices shown]
[im 35/103  soft-tissue]
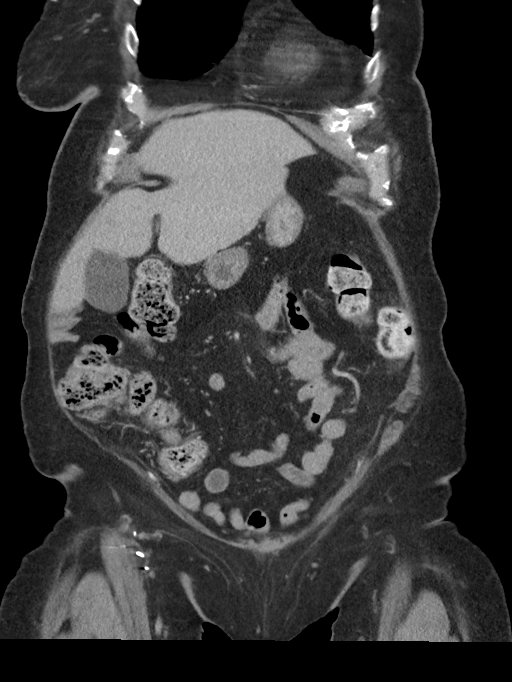
[im 46/103  soft-tissue]
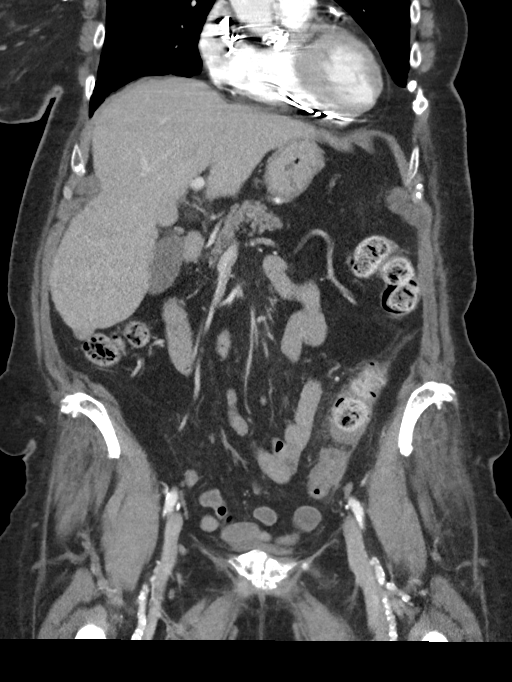
[im 57/103  soft-tissue]
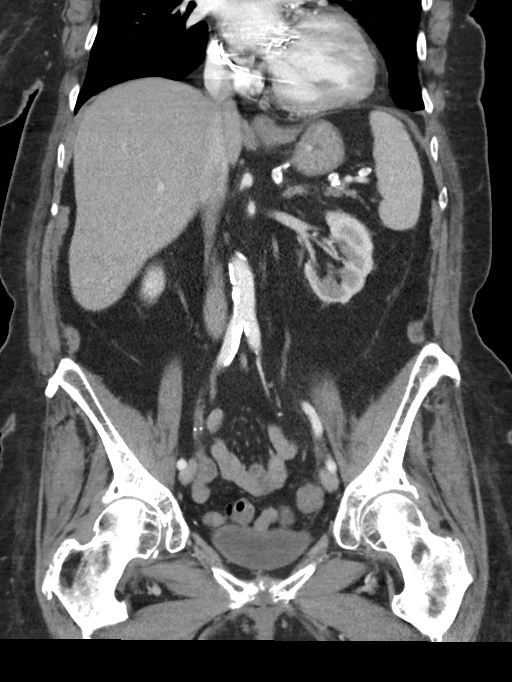

[15 of 46 positions shown; findings below may reference images not displayed]

RADIATION DOSE REDUCTION: This exam was performed according to the
departmental dose-optimization program which includes automated
exposure control, adjustment of the mA and/or kV according to
patient size and/or use of iterative reconstruction technique.

CONTRAST:  100mL OMNIPAQUE IOHEXOL 300 MG/ML  SOLN
FINDINGS: Lower chest: LEFT lower quadrant abdominal pain

Hepatobiliary: No focal hepatic lesion. No biliary duct dilatation.
Common bile duct is normal.

Pancreas: Pancreas is normal. No ductal dilatation. No pancreatic
inflammation.

Spleen: Normal spleen

Adrenals/urinary tract: Adrenal glands normal. High-density lesion
exophytic from the RIGHT kidney measures 11 mm not changed from 14
mm on comparison exam 1 year prior. Ureters and bladder normal.

Stomach/Bowel: Stomach, small bowel and cecum normal. Ascending and
transverse colon normal. There is inflammation surrounding the
distal descending colon proximal sigmoid colon (image 67 through 80
of series 2). There several diverticula through this region. No
perforation or abscess. Sigmoid colon rectum normal.

Vascular/Lymphatic: Abdominal aorta is normal caliber with
atherosclerotic calcification. There is no retroperitoneal or
periportal lymphadenopathy. No pelvic lymphadenopathy.

Reproductive: Post hysterectomy.  Adnexa unremarkable

Other: No intraperitoneal free air. Small midline ventral hernia at
the umbilicus.

Musculoskeletal: Degenerative osteophytosis of the spine.
IMPRESSION: 1. Findings most consistent with acute diverticulitis of the
descending colon. Segmental colitis is less favored. Consider
follow-up imaging or colonoscopy to demonstrate resolution.
2. Stable exophytic high-density lesion from the RIGHT kidney.
3. Small midline ventral hernia

## 2023-08-27 NOTE — Progress Notes (Signed)
Remote pacemaker transmission.   

## 2023-09-04 ENCOUNTER — Other Ambulatory Visit (HOSPITAL_COMMUNITY): Payer: Self-pay | Admitting: *Deleted

## 2023-09-04 ENCOUNTER — Other Ambulatory Visit (HOSPITAL_COMMUNITY): Payer: Self-pay | Admitting: Psychiatry

## 2023-09-04 DIAGNOSIS — F419 Anxiety disorder, unspecified: Secondary | ICD-10-CM

## 2023-09-04 DIAGNOSIS — F331 Major depressive disorder, recurrent, moderate: Secondary | ICD-10-CM

## 2023-09-04 MED ORDER — SERTRALINE HCL 100 MG PO TABS: 100.0000 mg | ORAL_TABLET | Freq: Every day | ORAL | 0 refills | Status: DC

## 2023-09-04 MED ORDER — TRAZODONE HCL 50 MG PO TABS
50.0000 mg | ORAL_TABLET | Freq: Every day | ORAL | 0 refills | Status: DC
Start: 2023-09-04 — End: 2023-09-10

## 2023-09-04 MED ORDER — CLONAZEPAM 0.5 MG PO TABS
0.5000 mg | ORAL_TABLET | Freq: Every day | ORAL | 0 refills | Status: DC
Start: 2023-09-07 — End: 2023-10-03

## 2023-09-10 ENCOUNTER — Other Ambulatory Visit (HOSPITAL_COMMUNITY): Payer: Self-pay | Admitting: *Deleted

## 2023-09-16 ENCOUNTER — Other Ambulatory Visit: Payer: Self-pay

## 2023-09-16 MED ORDER — METOPROLOL TARTRATE 25 MG PO TABS: 12.5000 mg | ORAL_TABLET | Freq: Two times a day (BID) | ORAL | 1 refills | Status: DC

## 2023-10-03 ENCOUNTER — Encounter (HOSPITAL_COMMUNITY): Payer: Self-pay | Admitting: Psychiatry

## 2023-10-03 ENCOUNTER — Ambulatory Visit (HOSPITAL_COMMUNITY): Payer: Medicare Other | Admitting: Psychiatry

## 2023-10-03 VITALS — BP 135/70 | HR 72 | Ht 62.0 in | Wt 121.0 lb

## 2023-10-03 DIAGNOSIS — F419 Anxiety disorder, unspecified: Secondary | ICD-10-CM

## 2023-10-03 DIAGNOSIS — F331 Major depressive disorder, recurrent, moderate: Secondary | ICD-10-CM

## 2023-10-03 DIAGNOSIS — F09 Unspecified mental disorder due to known physiological condition: Secondary | ICD-10-CM

## 2023-10-03 MED ORDER — CLONAZEPAM 0.5 MG PO TABS: 0.5000 mg | ORAL_TABLET | Freq: Every day | ORAL | 2 refills | Status: DC

## 2023-10-03 MED ORDER — SERTRALINE HCL 100 MG PO TABS: 100.0000 mg | ORAL_TABLET | Freq: Every day | ORAL | 0 refills | Status: DC

## 2023-10-03 MED ORDER — TRAZODONE HCL 100 MG PO TABS
50.0000 mg | ORAL_TABLET | Freq: Every evening | ORAL | 0 refills | Status: DC | PRN
Start: 2023-10-03 — End: 2024-01-03
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
## 2023-10-10 ENCOUNTER — Ambulatory Visit (INDEPENDENT_AMBULATORY_CARE_PROVIDER_SITE_OTHER): Payer: Medicare Other

## 2023-10-10 DIAGNOSIS — I442 Atrioventricular block, complete: Secondary | ICD-10-CM | POA: Diagnosis not present

## 2023-10-10 LAB — CUP PACEART REMOTE DEVICE CHECK
Battery Remaining Longevity: 31 mo
Battery Remaining Percentage: 24 %
Battery Voltage: 2.9 V
Brady Statistic AP VP Percent: 11 %
Brady Statistic AP VS Percent: 43 %
Brady Statistic AS VP Percent: 7.7 %
Brady Statistic AS VS Percent: 39 %
Brady Statistic RA Percent Paced: 53 %
Brady Statistic RV Percent Paced: 18 %
Date Time Interrogation Session: 20241107020024
Implantable Lead Connection Status: 753985
Implantable Lead Connection Status: 753985
Implantable Lead Implant Date: 20160928
Implantable Lead Implant Date: 20160928
Implantable Lead Location: 753859
Implantable Lead Location: 753860
Implantable Pulse Generator Implant Date: 20160928
Lead Channel Impedance Value: 410 Ohm
Lead Channel Impedance Value: 550 Ohm
Lead Channel Pacing Threshold Amplitude: 0.625 V
Lead Channel Pacing Threshold Amplitude: 0.875 V
Lead Channel Pacing Threshold Pulse Width: 0.4 ms
Lead Channel Pacing Threshold Pulse Width: 0.4 ms
Lead Channel Sensing Intrinsic Amplitude: 3 mV
Lead Channel Sensing Intrinsic Amplitude: 6.7 mV
Lead Channel Setting Pacing Amplitude: 0.875
Lead Channel Setting Pacing Amplitude: 1.875
Lead Channel Setting Pacing Pulse Width: 0.4 ms
Lead Channel Setting Sensing Sensitivity: 2 mV
Pulse Gen Model: 2240
Pulse Gen Serial Number: 7779295

## 2023-10-28 NOTE — Progress Notes (Signed)
Remote pacemaker transmission.   

## 2023-11-01 ENCOUNTER — Other Ambulatory Visit (HOSPITAL_COMMUNITY): Payer: Self-pay | Admitting: Psychiatry

## 2023-11-01 DIAGNOSIS — F331 Major depressive disorder, recurrent, moderate: Secondary | ICD-10-CM

## 2023-11-01 DIAGNOSIS — F419 Anxiety disorder, unspecified: Secondary | ICD-10-CM

## 2023-11-27 ENCOUNTER — Other Ambulatory Visit (HOSPITAL_COMMUNITY): Payer: Self-pay | Admitting: Psychiatry

## 2023-11-27 DIAGNOSIS — F419 Anxiety disorder, unspecified: Secondary | ICD-10-CM

## 2023-11-27 DIAGNOSIS — F331 Major depressive disorder, recurrent, moderate: Secondary | ICD-10-CM

## 2023-12-15 ENCOUNTER — Other Ambulatory Visit (HOSPITAL_COMMUNITY): Payer: Self-pay | Admitting: Psychiatry

## 2023-12-15 DIAGNOSIS — F419 Anxiety disorder, unspecified: Secondary | ICD-10-CM

## 2023-12-15 DIAGNOSIS — F331 Major depressive disorder, recurrent, moderate: Secondary | ICD-10-CM

## 2023-12-30 ENCOUNTER — Ambulatory Visit (HOSPITAL_COMMUNITY): Payer: Medicare Other | Admitting: Psychiatry

## 2024-01-02 ENCOUNTER — Other Ambulatory Visit: Payer: Self-pay

## 2024-01-02 ENCOUNTER — Other Ambulatory Visit (HOSPITAL_COMMUNITY): Payer: Self-pay | Admitting: Psychiatry

## 2024-01-02 ENCOUNTER — Ambulatory Visit (HOSPITAL_COMMUNITY): Payer: Medicare Other | Admitting: Psychiatry

## 2024-01-02 DIAGNOSIS — F331 Major depressive disorder, recurrent, moderate: Secondary | ICD-10-CM

## 2024-01-02 DIAGNOSIS — F419 Anxiety disorder, unspecified: Secondary | ICD-10-CM

## 2024-01-03 ENCOUNTER — Other Ambulatory Visit (HOSPITAL_COMMUNITY): Payer: Self-pay

## 2024-01-03 DIAGNOSIS — F331 Major depressive disorder, recurrent, moderate: Secondary | ICD-10-CM

## 2024-01-03 DIAGNOSIS — F419 Anxiety disorder, unspecified: Secondary | ICD-10-CM

## 2024-01-03 MED ORDER — SERTRALINE HCL 100 MG PO TABS: 100.0000 mg | ORAL_TABLET | Freq: Every day | ORAL | 0 refills | Status: DC

## 2024-01-03 MED ORDER — TRAZODONE HCL 100 MG PO TABS: 50.0000 mg | ORAL_TABLET | Freq: Every evening | ORAL | 0 refills | Status: DC | PRN

## 2024-01-03 MED ORDER — CLONAZEPAM 0.5 MG PO TABS: 0.5000 mg | ORAL_TABLET | Freq: Every day | ORAL | 1 refills | Status: DC

## 2024-01-06 ENCOUNTER — Other Ambulatory Visit: Payer: Self-pay

## 2024-01-06 MED ORDER — LOSARTAN POTASSIUM 50 MG PO TABS: 50.0000 mg | ORAL_TABLET | Freq: Every day | ORAL | 0 refills | Status: DC

## 2024-01-09 ENCOUNTER — Ambulatory Visit (INDEPENDENT_AMBULATORY_CARE_PROVIDER_SITE_OTHER): Payer: Medicare Other

## 2024-01-09 DIAGNOSIS — I442 Atrioventricular block, complete: Secondary | ICD-10-CM

## 2024-01-09 LAB — CUP PACEART REMOTE DEVICE CHECK
Battery Remaining Longevity: 28 mo
Battery Remaining Percentage: 22 %
Battery Voltage: 2.89 V
Brady Statistic AP VP Percent: 14 %
Brady Statistic AP VS Percent: 38 %
Brady Statistic AS VP Percent: 9.5 %
Brady Statistic AS VS Percent: 38 %
Brady Statistic RA Percent Paced: 52 %
Brady Statistic RV Percent Paced: 24 %
Date Time Interrogation Session: 20250206020014
Implantable Lead Connection Status: 753985
Implantable Lead Connection Status: 753985
Implantable Lead Implant Date: 20160928
Implantable Lead Implant Date: 20160928
Implantable Lead Location: 753859
Implantable Lead Location: 753860
Implantable Pulse Generator Implant Date: 20160928
Lead Channel Impedance Value: 390 Ohm
Lead Channel Impedance Value: 590 Ohm
Lead Channel Pacing Threshold Amplitude: 0.625 V
Lead Channel Pacing Threshold Amplitude: 0.75 V
Lead Channel Pacing Threshold Pulse Width: 0.4 ms
Lead Channel Pacing Threshold Pulse Width: 0.4 ms
Lead Channel Sensing Intrinsic Amplitude: 3.5 mV
Lead Channel Sensing Intrinsic Amplitude: 9.1 mV
Lead Channel Setting Pacing Amplitude: 0.875
Lead Channel Setting Pacing Amplitude: 1.75 V
Lead Channel Setting Pacing Pulse Width: 0.4 ms
Lead Channel Setting Sensing Sensitivity: 2 mV
Pulse Gen Model: 2240
Pulse Gen Serial Number: 7779295

## 2024-01-20 ENCOUNTER — Other Ambulatory Visit: Payer: Self-pay

## 2024-01-23 ENCOUNTER — Other Ambulatory Visit: Payer: Self-pay

## 2024-01-23 MED ORDER — LOSARTAN POTASSIUM 50 MG PO TABS: 50.0000 mg | ORAL_TABLET | Freq: Every day | ORAL | 0 refills | Status: DC

## 2024-02-06 ENCOUNTER — Ambulatory Visit (HOSPITAL_COMMUNITY): Payer: Medicare Other | Admitting: Psychiatry

## 2024-02-07 ENCOUNTER — Other Ambulatory Visit (HOSPITAL_COMMUNITY): Payer: Self-pay

## 2024-02-07 DIAGNOSIS — F419 Anxiety disorder, unspecified: Secondary | ICD-10-CM

## 2024-02-07 DIAGNOSIS — F331 Major depressive disorder, recurrent, moderate: Secondary | ICD-10-CM

## 2024-02-07 MED ORDER — SERTRALINE HCL 100 MG PO TABS: 100.0000 mg | ORAL_TABLET | Freq: Every day | ORAL | 0 refills | Status: DC

## 2024-02-07 MED ORDER — TRAZODONE HCL 100 MG PO TABS: 50.0000 mg | ORAL_TABLET | Freq: Every evening | ORAL | 0 refills | Status: DC | PRN

## 2024-02-10 ENCOUNTER — Other Ambulatory Visit: Payer: Self-pay | Admitting: Cardiology

## 2024-02-11 ENCOUNTER — Other Ambulatory Visit: Payer: Self-pay

## 2024-02-11 MED ORDER — ATORVASTATIN CALCIUM 40 MG PO TABS: 40.0000 mg | ORAL_TABLET | Freq: Every day | ORAL | 3 refills | Status: DC

## 2024-02-13 NOTE — Progress Notes (Signed)
 Remote pacemaker transmission.

## 2024-03-04 ENCOUNTER — Telehealth (HOSPITAL_COMMUNITY): Payer: Self-pay | Admitting: *Deleted

## 2024-03-04 ENCOUNTER — Other Ambulatory Visit: Payer: Self-pay

## 2024-03-04 DIAGNOSIS — F419 Anxiety disorder, unspecified: Secondary | ICD-10-CM

## 2024-03-04 DIAGNOSIS — F331 Major depressive disorder, recurrent, moderate: Secondary | ICD-10-CM

## 2024-03-04 MED ORDER — POTASSIUM CHLORIDE CRYS ER 10 MEQ PO TBCR
10.0000 meq | EXTENDED_RELEASE_TABLET | ORAL | 0 refills | Status: DC
Start: 2024-03-04 — End: 2024-03-06

## 2024-03-04 NOTE — Telephone Encounter (Signed)
 Mrs. Deese daughter "Donnie Aho" called requesting a refill of the Klonopin 0.5 mg 1 tablet at bedtime. The last script was ordered on 01/03/24 for #30 with 1 refill. Pt has a f/u appointment scheduled for 04/16/24. Pt last visit was on 10/03/23. Please review and advise.

## 2024-03-05 ENCOUNTER — Telehealth (HOSPITAL_COMMUNITY): Payer: Self-pay

## 2024-03-05 ENCOUNTER — Ambulatory Visit (HOSPITAL_COMMUNITY): Admitting: Psychiatry

## 2024-03-05 ENCOUNTER — Other Ambulatory Visit (HOSPITAL_COMMUNITY): Payer: Self-pay

## 2024-03-05 DIAGNOSIS — F419 Anxiety disorder, unspecified: Secondary | ICD-10-CM

## 2024-03-05 DIAGNOSIS — F331 Major depressive disorder, recurrent, moderate: Secondary | ICD-10-CM

## 2024-03-05 MED ORDER — CLONAZEPAM 0.5 MG PO TABS: 0.5000 mg | ORAL_TABLET | Freq: Every day | ORAL | 0 refills | Status: DC

## 2024-03-05 MED ORDER — TRAZODONE HCL 100 MG PO TABS: 50.0000 mg | ORAL_TABLET | Freq: Every evening | ORAL | 0 refills | Status: DC | PRN

## 2024-03-05 NOTE — Telephone Encounter (Signed)
 Pt has been advised.

## 2024-03-05 NOTE — Telephone Encounter (Signed)
 Patients daughter called, patient has an appointment on 5/15. You sent in Klonopin today, she asked for Trazodone as well, I provided a 30 day supply. Patients daughter said that Cailyn's husband passed away this morning.

## 2024-03-05 NOTE — Telephone Encounter (Signed)
 I sent the prescription however patient was no-show on the last visit.  In order to get future refills patient must be seen.

## 2024-03-06 ENCOUNTER — Other Ambulatory Visit: Payer: Self-pay

## 2024-03-06 MED ORDER — POTASSIUM CHLORIDE CRYS ER 10 MEQ PO TBCR: 10.0000 meq | EXTENDED_RELEASE_TABLET | ORAL | 0 refills | Status: DC

## 2024-03-18 ENCOUNTER — Other Ambulatory Visit: Payer: Self-pay | Admitting: Cardiology

## 2024-04-05 ENCOUNTER — Other Ambulatory Visit (HOSPITAL_COMMUNITY): Payer: Self-pay | Admitting: Psychiatry

## 2024-04-05 DIAGNOSIS — F419 Anxiety disorder, unspecified: Secondary | ICD-10-CM

## 2024-04-05 DIAGNOSIS — F331 Major depressive disorder, recurrent, moderate: Secondary | ICD-10-CM

## 2024-04-06 ENCOUNTER — Telehealth (HOSPITAL_COMMUNITY): Payer: Self-pay | Admitting: *Deleted

## 2024-04-06 ENCOUNTER — Other Ambulatory Visit (HOSPITAL_COMMUNITY): Payer: Self-pay | Admitting: *Deleted

## 2024-04-06 DIAGNOSIS — F331 Major depressive disorder, recurrent, moderate: Secondary | ICD-10-CM

## 2024-04-06 DIAGNOSIS — F419 Anxiety disorder, unspecified: Secondary | ICD-10-CM

## 2024-04-06 MED ORDER — CLONAZEPAM 0.5 MG PO TABS: 0.5000 mg | ORAL_TABLET | Freq: Every day | ORAL | 0 refills | Status: DC

## 2024-04-06 NOTE — Telephone Encounter (Signed)
 Pt's daughter, Ione Manly, called requesting a bridge script of the Klonopin  0.5 mg at bedtime. Last script was e-scribed on 03/05/24 for #30 and pt is out. Pt last visit was 10/03/23 with several No Show and cancellation history. Pt does now have an appointment scheduled for 04/16/24. Please review.

## 2024-04-06 NOTE — Telephone Encounter (Signed)
 Since she had multiple no-shows please send the bridge prescription until her next appointment and if she did not keep that appointment then we may have to refer her out.

## 2024-04-06 NOTE — Telephone Encounter (Signed)
 Daughter has been advised and says she will be driving pt to appointment next week.

## 2024-04-09 ENCOUNTER — Ambulatory Visit (INDEPENDENT_AMBULATORY_CARE_PROVIDER_SITE_OTHER): Payer: Medicare Other

## 2024-04-09 DIAGNOSIS — I442 Atrioventricular block, complete: Secondary | ICD-10-CM | POA: Diagnosis not present

## 2024-04-09 LAB — CUP PACEART REMOTE DEVICE CHECK
Battery Remaining Longevity: 25 mo
Battery Remaining Percentage: 20 %
Battery Voltage: 2.87 V
Brady Statistic AP VP Percent: 14 %
Brady Statistic AP VS Percent: 34 %
Brady Statistic AS VP Percent: 14 %
Brady Statistic AS VS Percent: 37 %
Brady Statistic RA Percent Paced: 48 %
Brady Statistic RV Percent Paced: 28 %
Date Time Interrogation Session: 20250508020011
Implantable Lead Connection Status: 753985
Implantable Lead Connection Status: 753985
Implantable Lead Implant Date: 20160928
Implantable Lead Implant Date: 20160928
Implantable Lead Location: 753859
Implantable Lead Location: 753860
Implantable Pulse Generator Implant Date: 20160928
Lead Channel Impedance Value: 400 Ohm
Lead Channel Impedance Value: 580 Ohm
Lead Channel Pacing Threshold Amplitude: 0.75 V
Lead Channel Pacing Threshold Amplitude: 0.75 V
Lead Channel Pacing Threshold Pulse Width: 0.4 ms
Lead Channel Pacing Threshold Pulse Width: 0.4 ms
Lead Channel Sensing Intrinsic Amplitude: 3.4 mV
Lead Channel Sensing Intrinsic Amplitude: 7.2 mV
Lead Channel Setting Pacing Amplitude: 1 V
Lead Channel Setting Pacing Amplitude: 1.75 V
Lead Channel Setting Pacing Pulse Width: 0.4 ms
Lead Channel Setting Sensing Sensitivity: 2 mV
Pulse Gen Model: 2240
Pulse Gen Serial Number: 7779295

## 2024-04-13 ENCOUNTER — Other Ambulatory Visit: Payer: Self-pay | Admitting: Cardiology

## 2024-04-16 ENCOUNTER — Other Ambulatory Visit: Payer: Self-pay

## 2024-04-16 ENCOUNTER — Encounter (HOSPITAL_COMMUNITY): Payer: Self-pay | Admitting: Psychiatry

## 2024-04-16 ENCOUNTER — Ambulatory Visit (HOSPITAL_BASED_OUTPATIENT_CLINIC_OR_DEPARTMENT_OTHER): Admitting: Psychiatry

## 2024-04-16 VITALS — BP 155/70 | HR 73 | Ht 62.0 in | Wt 112.0 lb

## 2024-04-16 DIAGNOSIS — F331 Major depressive disorder, recurrent, moderate: Secondary | ICD-10-CM

## 2024-04-16 DIAGNOSIS — F419 Anxiety disorder, unspecified: Secondary | ICD-10-CM

## 2024-04-16 DIAGNOSIS — F09 Unspecified mental disorder due to known physiological condition: Secondary | ICD-10-CM

## 2024-04-16 MED ORDER — TRAZODONE HCL 50 MG PO TABS: 50.0000 mg | ORAL_TABLET | Freq: Every evening | ORAL | 1 refills | Status: DC | PRN

## 2024-04-16 MED ORDER — CLONAZEPAM 0.5 MG PO TABS: 0.5000 mg | ORAL_TABLET | Freq: Every day | ORAL | 5 refills | Status: DC
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
## 2024-04-28 ENCOUNTER — Ambulatory Visit: Admitting: Cardiology

## 2024-05-04 ENCOUNTER — Other Ambulatory Visit (HOSPITAL_COMMUNITY): Payer: Self-pay | Admitting: Psychiatry

## 2024-05-04 DIAGNOSIS — F419 Anxiety disorder, unspecified: Secondary | ICD-10-CM

## 2024-05-04 DIAGNOSIS — F331 Major depressive disorder, recurrent, moderate: Secondary | ICD-10-CM

## 2024-05-05 ENCOUNTER — Other Ambulatory Visit: Payer: Self-pay | Admitting: Cardiology

## 2024-05-05 ENCOUNTER — Ambulatory Visit: Admitting: Cardiology

## 2024-05-12 ENCOUNTER — Ambulatory Visit: Admitting: Internal Medicine

## 2024-05-14 ENCOUNTER — Ambulatory Visit: Admitting: Cardiology

## 2024-05-17 ENCOUNTER — Other Ambulatory Visit: Payer: Self-pay | Admitting: Cardiovascular Disease

## 2024-05-18 NOTE — Progress Notes (Signed)
 Remote pacemaker transmission.

## 2024-05-20 ENCOUNTER — Other Ambulatory Visit: Payer: Self-pay | Admitting: Cardiology

## 2024-05-21 ENCOUNTER — Ambulatory Visit (INDEPENDENT_AMBULATORY_CARE_PROVIDER_SITE_OTHER): Admitting: Internal Medicine

## 2024-05-21 ENCOUNTER — Encounter: Payer: Self-pay | Admitting: Internal Medicine

## 2024-05-21 VITALS — BP 116/60 | HR 75 | Ht 62.0 in | Wt 109.6 lb

## 2024-05-21 DIAGNOSIS — I10 Essential (primary) hypertension: Secondary | ICD-10-CM | POA: Diagnosis not present

## 2024-05-21 DIAGNOSIS — E119 Type 2 diabetes mellitus without complications: Secondary | ICD-10-CM | POA: Diagnosis not present

## 2024-05-21 DIAGNOSIS — E038 Other specified hypothyroidism: Secondary | ICD-10-CM

## 2024-05-21 DIAGNOSIS — E782 Mixed hyperlipidemia: Secondary | ICD-10-CM | POA: Diagnosis not present

## 2024-05-21 DIAGNOSIS — M1909 Primary osteoarthritis, other specified site: Secondary | ICD-10-CM

## 2024-05-21 NOTE — Progress Notes (Signed)
 Established Patient Office Visit  Subjective:  Patient ID: Catherine [name], female    DOB: 05-30-1941  Age: 83 y.o. MRN: [id number]  Chief Complaint  Patient presents with   Follow-up    Patient comes in for her follow-up today.  She has not been in the office since July 2024.  She is sitting in wheelchair and complains of generalized weakness although nothing is hurting although she does have a history of osteoarthritis.  She is accompanied by her husband who reports that she uses a walker at the home.  But she prefers to sit in a wheelchair because of her balance issues.  She used to get home physical therapy a while back.  Will benefit from gait strengthening exercises.  Will set it up again.  Patient denies chest pain but occasionally gets shortness of breath.  She is under care of cardiology.  She is also being managed by the psychiatry for her depressive episodes. Patient has lost significant weight since her last visit, although she eats her meals but very small portions.  Denies nausea or vomiting, no diarrhea and no constipation.  But she does have chronic bladder issues.  Has been evaluated by the urologist. Will check her blood work today.  Patient will try to give a urine sample if possible.    No other concerns at this time.   Past Medical History:  Diagnosis Date   Acute colitis    Anemia    CAD (coronary artery disease)    a. heavy calcification of the entire LAD & mod eccentric mid LAD stenosis, estimated @ 70%, mild nonobs stenosis of RCA and LCx, severely calcified Ao valve with restricted valve mobility and 2+ AI     Cancer (HCC) 1981   breast   CHB (complete heart block) (HCC) 08/31/2015   [employer] Assurity DR  model [id number] (serial number  [id number] )    Chronic diastolic congestive heart failure (HCC)    a. echo 08/31/2015: EF 65-70%, nl WM, GR1DD, Ao valve stent bioprosthesis was present and functioning nl, no regurg, LA mildly dilated, PASP 46 mm Hg    Collagen vascular disease (HCC)    Colon polyps    Depression    Diabetes mellitus type II    Fibromyalgia    HTN (hypertension)    Hypercholesteremia    Hypothyroidism    IBS (irritable bowel syndrome)    Presence of permanent cardiac pacemaker    Rectal bleeding    Rheumatoid arthritis(714.0)    S/P TAVR (transcatheter aortic valve replacement) 08/30/2015   26 mm Edwards Sapien 3 transcatheter heart valve placed via open right transfemoral approach   Shortness of breath dyspnea    Stenosis of aortic valve    Suicide attempt (HCC)    Thyroid  disease     Past Surgical History:  Procedure Laterality Date   ABDOMINAL SURGERY     Intestinal surgery   CARDIAC SURGERY     CESAREAN SECTION     x 2   COLONOSCOPY WITH PROPOFOL  N/A 07/25/2022   Procedure: COLONOSCOPY WITH PROPOFOL ;  Surgeon: Shane Darling, MD;  Location: [facility] ENDOSCOPY;  Service: Endoscopy;  Laterality: N/A;   EP IMPLANTABLE DEVICE N/A 08/31/2015   Procedure: Pacemaker Implant;  Surgeon: Will Cortland Ding, MD; [facility] Assurity DR  model [phone] [id number] (serial number  [id number] ); Laterality: Right   ESOPHAGOGASTRODUODENOSCOPY (EGD) WITH PROPOFOL  N/A 04/04/2021   Procedure: ESOPHAGOGASTRODUODENOSCOPY (EGD) WITH PROPOFOL ;  Surgeon: Luke Salaam, MD;  Location: [facility] ENDOSCOPY;  Service: Gastroenterology;  Laterality: N/A;   LAPAROSCOPY N/A 01/31/2020   Procedure: LAPAROSCOPY DIAGNOSTIC;  Surgeon: Dorrie Gaudier Alphonso Aschoff, MD;  [facility];  Service: General;  Laterality: N/A;   LAPAROTOMY N/A 01/31/2020   Procedure: Exploratory Laparotomy;  Surgeon: Dorrie Gaudier Alphonso Aschoff, MD;  Location: [facility] OR;  Service: General;  Laterality: N/A;   LEFT OOPHORECTOMY     LYSIS OF ADHESION  01/31/2020   Procedure: Lysis Of Adhesions, Ligation of Falciform Vein;  Surgeon: Derral Flick, MD;  Location: [facility] OR;  Service: General;;   MASTECTOMY Left    polyp      self inflicted chest wound     SPINE SURGERY  1981   lower back   TEE  WITHOUT CARDIOVERSION N/A 08/30/2015   Procedure: TRANSESOPHAGEAL ECHOCARDIOGRAM (TEE);  Surgeon: Arnoldo Lapping, MD;  Location: [facility] OR;  Service: Open Heart Surgery;  Laterality: N/A;   TOTAL ABDOMINAL HYSTERECTOMY     TRANSCATHETER AORTIC VALVE REPLACEMENT, TRANSFEMORAL N/A 08/30/2015   Procedure: TRANSCATHETER AORTIC VALVE REPLACEMENT, TRANSFEMORAL;  Surgeon: Arnoldo Lapping, MD;  Location: [facility] OR;  Service: Open Heart Surgery;  Laterality: N/A;    Social History   Socioeconomic History   Marital status: Married    Spouse name: Not on file   Number of children: Not on file   Years of education: Not on file   Highest education level: Not on file  Occupational History   Not on file  Tobacco Use   Smoking status: Never   Smokeless tobacco: Never  Vaping Use   Vaping status: Never Used  Substance and Sexual Activity   Alcohol use: No    Alcohol/week: 0.0 standard drinks of alcohol   Drug use: No   Sexual activity: Not Currently  Other Topics Concern   Not on file  Social History Narrative   ** Merged History Encounter **       Social Drivers of Corporate investment banker Strain: Not on file  Food Insecurity: Not on file  Transportation Needs: Not on file  Physical Activity: Not on file  Stress: Not on file  Social Connections: Not on file  Intimate Partner Violence: Not on file    Family History  Problem Relation Age of Onset   Depression Sister    Depression Sister    Depression Sister    Hypertension Mother    Lung cancer Sister    Stroke Sister    Hypertension Sister    Pneumonia Brother    Heart attack Neg Hx     Allergies  Allergen Reactions   Tetracyclines & Related Anaphylaxis and Rash   Cephalexin  Diarrhea   Sulfa Antibiotics Hives   Latex Rash    Outpatient Medications Prior to Visit  Medication Sig   acetaminophen  (TYLENOL ) 325 MG tablet Take 2 tablets (650 mg total) by mouth every 4 (four) hours as needed.   aspirin  EC 81 MG tablet Take 1  tablet (81 mg total) by mouth daily. Swallow whole. Hold this medication until you see your PCP and/or cardiologist   cholecalciferol  (VITAMIN D3) 25 MCG (1000 UNIT) tablet Take 1,000 Units by mouth daily.   clonazePAM  (KLONOPIN ) 0.5 MG tablet Take 1 tablet (0.5 mg total) by mouth at bedtime.   ezetimibe  (ZETIA ) 10 MG tablet Take 1 tablet (10 mg total) by mouth daily.   isosorbide  mononitrate (IMDUR ) 30 MG 24 hr tablet Take 0.5 tablets (15 mg total) by mouth daily.   levothyroxine  (SYNTHROID ) 112 MCG  tablet TAKE 1 TABLET BY MOUTH IN THE  MORNING ON AN EMPTY STOMACH 1  HOUR BEFORE OTHER MEDICATIONS OR FOOD   losartan  (COZAAR ) 50 MG tablet TAKE 1 TABLET BY MOUTH DAILY   metoprolol  tartrate (LOPRESSOR ) 25 MG tablet TAKE ONE-HALF TABLET BY MOUTH  TWICE DAILY   Multiple Vitamin (MULTIVITAMIN WITH MINERALS) TABS tablet Take 1 tablet by mouth daily.   nitroGLYCERIN  (NITROSTAT ) 0.4 MG SL tablet Place 1 tablet (0.4 mg total) under the tongue every 5 (five) minutes as needed for chest pain.   pantoprazole  (PROTONIX ) 40 MG tablet Take 40 mg by mouth daily.   polyethylene glycol powder (GLYCOLAX /MIRALAX ) 17 GM/SCOOP powder Take 17 g by mouth daily.   traZODone  (DESYREL ) 50 MG tablet Take 1 tablet (50 mg total) by mouth at bedtime as needed for sleep.   Zinc  Oxide (TRIPLE PASTE) 12.8 % ointment Apply topically 4 (four) times daily.   potassium chloride  (KLOR-CON  M) 10 MEQ tablet TAKE 1 TABLET BY MOUTH EVERY  OTHER DAY (Patient not taking: Reported on 05/21/2024)   sertraline  (ZOLOFT ) 100 MG tablet Take 1 tablet (100 mg total) by mouth daily. (Patient not taking: Reported on 05/21/2024)   No facility-administered medications prior to visit.    Review of Systems  Constitutional:  Positive for malaise/fatigue and weight loss. Negative for chills, diaphoresis and fever.  HENT: Negative.  Negative for sore throat.   Eyes: Negative.   Respiratory: Negative.  Negative for cough and shortness of breath.    Cardiovascular: Negative.  Negative for chest pain, palpitations and leg swelling.  Gastrointestinal: Negative.  Negative for abdominal pain, constipation, diarrhea, heartburn, nausea and vomiting.  Genitourinary: Negative.  Negative for dysuria and flank pain.  Musculoskeletal: Negative.  Negative for joint pain and myalgias.  Skin: Negative.   Neurological:  Positive for weakness. Negative for dizziness, focal weakness and headaches.  Endo/Heme/Allergies: Negative.   Psychiatric/Behavioral: Negative.  Negative for depression and suicidal ideas. The patient is not nervous/anxious.        Objective:   BP 116/60   Pulse 75   Ht 5' 2 (1.575 m)   Wt 109 lb 9.6 oz (49.7 kg)   SpO2 98%   BMI 20.05 kg/m   Vitals:   05/21/24 1451  BP: 116/60  Pulse: 75  Height: 5' 2 (1.575 m)  Weight: 109 lb 9.6 oz (49.7 kg)  SpO2: 98%  BMI (Calculated): 20.04    Physical Exam Vitals and nursing note reviewed.  Constitutional:      Appearance: Normal appearance.  HENT:     Head: Normocephalic and atraumatic.     Nose: Nose normal.     Mouth/Throat:     Mouth: Mucous membranes are moist.     Pharynx: Oropharynx is clear.   Eyes:     Conjunctiva/sclera: Conjunctivae normal.     Pupils: Pupils are equal, round, and reactive to light.    Cardiovascular:     Rate and Rhythm: Normal rate and regular rhythm.     Pulses: Normal pulses.     Heart sounds: Normal heart sounds. No murmur heard. Pulmonary:     Effort: Pulmonary effort is normal.     Breath sounds: Normal breath sounds. No wheezing.  Abdominal:     General: Bowel sounds are normal.     Palpations: Abdomen is soft.     Tenderness: There is no abdominal tenderness. There is no right CVA tenderness or left CVA tenderness.   Musculoskeletal:  General: Normal range of motion.     Cervical back: Normal range of motion.     Right lower leg: No edema.     Left lower leg: No edema.   Skin:    General: Skin is warm and  dry.   Neurological:     General: No focal deficit present.     Mental Status: She is alert and oriented to person, place, and time.   Psychiatric:        Mood and Affect: Mood normal.        Behavior: Behavior normal.      No results found for any visits on 05/21/24.  Recent Results (from the past 2160 hours)  CUP PACEART REMOTE DEVICE CHECK     Status: None   Collection Time: 04/09/24  2:00 AM  Result Value Ref Range   Date Time Interrogation Session [id number]    Pulse Generator Manufacturer [employer]    Pulse Gen Model 2240 Assurity    Pulse Gen Serial Number [id number]    Clinic Name [facility]    Implantable Pulse Generator Type Implantable Pulse Generator    Implantable Pulse Generator Implant Date 84696295    Implantable Lead Manufacturer [facility]    Implantable Lead Model [phone] Tendril STS    Implantable Lead Serial Number [id number]    Implantable Lead Implant Date [id number]    Implantable Lead Location Detail 1 UNKNOWN    Implantable Lead Location [id number]    Implantable Lead Connection Status [id number]    Implantable Lead Manufacturer [facility]    Implantable Lead Model [id number] Tendril STS    Implantable Lead Serial Number [id number]    Implantable Lead Implant Date [id number]    Implantable Lead Location Detail 1 APEX    Implantable Lead Location [id number]    Implantable Lead Connection Status [id number]    Lead Channel Setting Sensing Sensitivity 2.0 mV   Lead Channel Setting Sensing Adaptation Mode Fixed Pacing    Lead Channel Setting Pacing Amplitude 1.75 V   Lead Channel Setting Pacing Pulse Width 0.4 ms   Lead Channel Setting Pacing Amplitude 1.0 V   Lead Channel Status NULL    Lead Channel Impedance Value 400 ohm   Lead Channel Sensing Intrinsic Amplitude 3.4 mV   Lead Channel Pacing Threshold Amplitude 0.75 V   Lead Channel Pacing Threshold Pulse Width 0.4 ms   Lead Channel Status NULL    Lead Channel Impedance Value 580 ohm   Lead Channel Sensing Intrinsic Amplitude 7.2  mV   Lead Channel Pacing Threshold Amplitude 0.75 V   Lead Channel Pacing Threshold Pulse Width 0.4 ms   Battery Status MOS    Battery Remaining Longevity 25 mo   Battery Remaining Percentage 20.0 %   Battery Voltage 2.87 V   Brady Statistic RA Percent Paced 48.0 %   Brady Statistic RV Percent Paced 28.0 %   Brady Statistic AP VP Percent 14.0 %   Brady Statistic AS VP Percent 14.0 %   Brady Statistic AP VS Percent 34.0 %   Brady Statistic AS VS Percent 37.0 %      Assessment & Plan:  Continue current medications.  Check blood work.  Set up home physical therapy. Problem List Items Addressed This Visit     Essential hypertension - Primary (Chronic)   Hypothyroidism (Chronic)   Hyperlipidemia   Osteoarthritis   Relevant Orders   Ambulatory referral to Home Health   Other Visit Diagnoses       Diabetes mellitus  without complication (HCC)           Return in about 3 months (around 08/21/2024).   Total time spent: 30 minutes  Aisha Hove, MD  05/21/2024   This document may have been prepared by [facility] Voice Recognition software and as such may include unintentional dictation errors.

## 2024-05-22 ENCOUNTER — Ambulatory Visit: Payer: Self-pay | Admitting: Internal Medicine

## 2024-05-22 LAB — CMP14+EGFR
ALT: 5 IU/L (ref 0–32)
AST: 14 IU/L (ref 0–40)
Albumin: 4.2 g/dL (ref 3.7–4.7)
Alkaline Phosphatase: 60 IU/L (ref 44–121)
BUN/Creatinine Ratio: 22 (ref 12–28)
BUN: 17 mg/dL (ref 8–27)
Bilirubin Total: 0.4 mg/dL (ref 0.0–1.2)
CO2: 23 mmol/L (ref 20–29)
Calcium: 9.8 mg/dL (ref 8.7–10.3)
Chloride: 100 mmol/L (ref 96–106)
Creatinine, Ser: 0.79 mg/dL (ref 0.57–1.00)
Globulin, Total: 2.9 g/dL (ref 1.5–4.5)
Glucose: 81 mg/dL (ref 70–99)
Potassium: 4.5 mmol/L (ref 3.5–5.2)
Sodium: 137 mmol/L (ref 134–144)
Total Protein: 7.1 g/dL (ref 6.0–8.5)
eGFR: 74 mL/min/{1.73_m2} (ref >59)

## 2024-05-22 LAB — CBC WITH DIFF/PLATELET
Basophils Absolute: 0 10*3/uL (ref 0.0–0.2)
Basos: 1 %
EOS (ABSOLUTE): 0.1 10*3/uL (ref 0.0–0.4)
Eos: 2 %
Hematocrit: 42.1 % (ref 34.0–46.6)
Hemoglobin: 13.5 g/dL (ref 11.1–15.9)
Immature Grans (Abs): 0 10*3/uL (ref 0.0–0.1)
Immature Granulocytes: 0 %
Lymphocytes Absolute: 1.6 10*3/uL (ref 0.7–3.1)
Lymphs: 40 %
MCH: 30.1 pg (ref 26.6–33.0)
MCHC: 32.1 g/dL (ref 31.5–35.7)
MCV: 94 fL (ref 79–97)
Monocytes Absolute: 0.4 10*3/uL (ref 0.1–0.9)
Monocytes: 9 %
Neutrophils Absolute: 2 10*3/uL (ref 1.4–7.0)
Neutrophils: 48 %
Platelets: 196 10*3/uL (ref 150–450)
RBC: 4.49 x10E6/uL (ref 3.77–5.28)
RDW: 12.2 % (ref 11.7–15.4)
WBC: 4.1 10*3/uL (ref 3.4–10.8)

## 2024-05-22 LAB — LIPID PANEL
Chol/HDL Ratio: 3.8 ratio (ref 0.0–4.4)
Cholesterol, Total: 206 mg/dL — ABNORMAL HIGH (ref 100–199)
HDL: 54 mg/dL (ref >39)
LDL Chol Calc (NIH): 134 mg/dL — ABNORMAL HIGH (ref 0–99)
Triglycerides: 98 mg/dL (ref 0–149)
VLDL Cholesterol Cal: 18 mg/dL (ref 5–40)

## 2024-05-22 LAB — HEMOGLOBIN A1C
Est. average glucose Bld gHb Est-mCnc: 120 mg/dL
Hgb A1c MFr Bld: 5.8 % — ABNORMAL HIGH (ref 4.8–5.6)

## 2024-05-22 LAB — TSH: TSH: 0.396 u[IU]/mL — ABNORMAL LOW (ref 0.450–4.500)

## 2024-05-24 ENCOUNTER — Other Ambulatory Visit: Payer: Self-pay | Admitting: Cardiovascular Disease

## 2024-06-01 ENCOUNTER — Other Ambulatory Visit: Payer: Self-pay

## 2024-06-01 MED ORDER — ROSUVASTATIN CALCIUM 10 MG PO TABS: 10.0000 mg | ORAL_TABLET | Freq: Every day | ORAL | 3 refills | Status: AC

## 2024-06-01 NOTE — Progress Notes (Signed)
 Informed patient, rx sent.

## 2024-06-02 ENCOUNTER — Telehealth: Payer: Self-pay | Admitting: Internal Medicine

## 2024-06-02 NOTE — Telephone Encounter (Signed)
 Left VM to report that patient's daughter requested that they push back the start of PT until 7/7.

## 2024-06-10 ENCOUNTER — Telehealth: Payer: Self-pay | Admitting: Internal Medicine

## 2024-06-10 NOTE — Telephone Encounter (Signed)
 Mark Bias, PT with [facility], left VM requesting to move back the patient's start of care date to 06/12/24. Please call back with okay to put back SOC date.   [phone]

## 2024-06-15 ENCOUNTER — Other Ambulatory Visit: Payer: Self-pay | Admitting: Cardiology

## 2024-06-17 ENCOUNTER — Other Ambulatory Visit: Payer: Self-pay | Admitting: Cardiovascular Disease

## 2024-07-07 ENCOUNTER — Other Ambulatory Visit: Payer: Self-pay | Admitting: Cardiology

## 2024-07-09 ENCOUNTER — Ambulatory Visit: Payer: Medicare Other

## 2024-07-09 DIAGNOSIS — I442 Atrioventricular block, complete: Secondary | ICD-10-CM

## 2024-07-09 LAB — CUP PACEART REMOTE DEVICE CHECK
Battery Remaining Longevity: 22 mo
Battery Remaining Percentage: 17 %
Battery Voltage: 2.86 V
Brady Statistic AP VP Percent: 12 %
Brady Statistic AP VS Percent: 31 %
Brady Statistic AS VP Percent: 21 %
Brady Statistic AS VS Percent: 36 %
Brady Statistic RA Percent Paced: 42 %
Brady Statistic RV Percent Paced: 32 %
Date Time Interrogation Session: 20250807020014
Implantable Lead Connection Status: 753985
Implantable Lead Connection Status: 753985
Implantable Lead Implant Date: 20160928
Implantable Lead Implant Date: 20160928
Implantable Lead Location: 753859
Implantable Lead Location: 753860
Implantable Pulse Generator Implant Date: 20160928
Lead Channel Impedance Value: 390 Ohm
Lead Channel Impedance Value: 560 Ohm
Lead Channel Pacing Threshold Amplitude: 0.75 V
Lead Channel Pacing Threshold Amplitude: 0.875 V
Lead Channel Pacing Threshold Pulse Width: 0.4 ms
Lead Channel Pacing Threshold Pulse Width: 0.4 ms
Lead Channel Sensing Intrinsic Amplitude: 3.7 mV
Lead Channel Sensing Intrinsic Amplitude: 6.7 mV
Lead Channel Setting Pacing Amplitude: 1.125
Lead Channel Setting Pacing Amplitude: 1.75 V
Lead Channel Setting Pacing Pulse Width: 0.4 ms
Lead Channel Setting Sensing Sensitivity: 2 mV
Pulse Gen Model: 2240
Pulse Gen Serial Number: 7779295

## 2024-07-10 ENCOUNTER — Ambulatory Visit: Payer: Self-pay | Admitting: Cardiology

## 2024-08-07 ENCOUNTER — Other Ambulatory Visit: Payer: Self-pay | Admitting: Cardiovascular Disease

## 2024-08-18 ENCOUNTER — Other Ambulatory Visit: Payer: Self-pay | Admitting: Family

## 2024-08-21 ENCOUNTER — Ambulatory Visit: Admitting: Internal Medicine

## 2024-08-29 NOTE — Progress Notes (Signed)
 Remote PPM Transmission

## 2024-09-03 ENCOUNTER — Ambulatory Visit: Admitting: Internal Medicine

## 2024-09-08 ENCOUNTER — Encounter: Payer: Self-pay | Admitting: Internal Medicine

## 2024-09-08 ENCOUNTER — Ambulatory Visit: Admitting: Internal Medicine

## 2024-09-08 VITALS — BP 124/62 | HR 69 | Ht 62.0 in

## 2024-09-08 DIAGNOSIS — E038 Other specified hypothyroidism: Secondary | ICD-10-CM | POA: Diagnosis not present

## 2024-09-08 DIAGNOSIS — E782 Mixed hyperlipidemia: Secondary | ICD-10-CM | POA: Diagnosis not present

## 2024-09-08 DIAGNOSIS — I10 Essential (primary) hypertension: Secondary | ICD-10-CM | POA: Diagnosis not present

## 2024-09-08 DIAGNOSIS — E119 Type 2 diabetes mellitus without complications: Secondary | ICD-10-CM

## 2024-09-08 DIAGNOSIS — Z23 Encounter for immunization: Secondary | ICD-10-CM | POA: Diagnosis not present

## 2024-09-08 NOTE — Progress Notes (Signed)
 Established Patient Office Visit  Subjective:  Patient ID: Catherine [name], female    DOB: 11-01-1941  Age: 83 y.o. MRN: [id number]  Chief Complaint  Patient presents with   Follow-up    3 month follow up    Patient comes in with her husband. She is still using her wheelchair as scared to fall . Both ar every hard of hearing- but decline hearing aids. Patient feels well, mentions occasional headaches but no dizziness, no chest pain and no shortness of breath. Flu shot and labs today.    No other concerns at this time.   Past Medical History:  Diagnosis Date   Acute colitis    Anemia    CAD (coronary artery disease)    a. heavy calcification of the entire LAD & mod eccentric mid LAD stenosis, estimated @ 70%, mild nonobs stenosis of RCA and LCx, severely calcified Ao valve with restricted valve mobility and 2+ AI     Cancer (HCC) 1981   breast   CHB (complete heart block) (HCC) 08/31/2015   [employer] Assurity DR  model [id number] (serial number  [id number] )    Chronic diastolic congestive heart failure (HCC)    a. echo 08/31/2015: EF 65-70%, nl WM, GR1DD, Ao valve stent bioprosthesis was present and functioning nl, no regurg, LA mildly dilated, PASP 46 mm Hg   Collagen vascular disease    Colon polyps    Depression    Diabetes mellitus type II    Fibromyalgia    HTN (hypertension)    Hypercholesteremia    Hypothyroidism    IBS (irritable bowel syndrome)    Presence of permanent cardiac pacemaker    Rectal bleeding    Rheumatoid arthritis(714.0)    S/P TAVR (transcatheter aortic valve replacement) 08/30/2015   26 mm Edwards Sapien 3 transcatheter heart valve placed via open right transfemoral approach   Shortness of breath dyspnea    Stenosis of aortic valve    Suicide attempt (HCC)    Thyroid  disease     Past Surgical History:  Procedure Laterality Date   ABDOMINAL SURGERY     Intestinal surgery   CARDIAC SURGERY     CESAREAN SECTION     x 2   COLONOSCOPY WITH  PROPOFOL  N/A 07/25/2022   Procedure: COLONOSCOPY WITH PROPOFOL ;  Surgeon: Maryruth Ole DASEN, MD;  Location: [facility] ENDOSCOPY;  Service: Endoscopy;  Laterality: N/A;   EP IMPLANTABLE DEVICE N/A 08/31/2015   Procedure: Pacemaker Implant;  Surgeon: Will Gladis Norton, MD; [facility] Assurity DR  model [phone] (serial number  [id number] ); Laterality: Right   ESOPHAGOGASTRODUODENOSCOPY (EGD) WITH PROPOFOL  N/A 04/04/2021   Procedure: ESOPHAGOGASTRODUODENOSCOPY (EGD) WITH PROPOFOL ;  Surgeon: Therisa Bi, MD;  Location: [facility] ENDOSCOPY;  Service: Gastroenterology;  Laterality: N/A;   LAPAROSCOPY N/A 01/31/2020   Procedure: LAPAROSCOPY DIAGNOSTIC;  Surgeon: Stevie Herlene Righter, MD;  Location: [facility] OR;  Service: General;  Laterality: N/A;   LAPAROTOMY N/A 01/31/2020   Procedure: Exploratory Laparotomy;  Surgeon: Stevie Herlene Righter, MD;  Location: [facility] OR;  Service: General;  Laterality: N/A;   LEFT OOPHORECTOMY     LYSIS OF ADHESION  01/31/2020   Procedure: Lysis Of Adhesions, Ligation of Falciform Vein;  Surgeon: Kinsinger, Herlene Righter, MD;  Location: [facility] OR;  Service: General;;   MASTECTOMY Left    polyp      self inflicted chest wound     SPINE SURGERY  1981   lower back   TEE WITHOUT CARDIOVERSION N/A  08/30/2015   Procedure: TRANSESOPHAGEAL ECHOCARDIOGRAM (TEE);  Surgeon: Ozell Fell, MD;  Location: [facility] OR;  Service: Open Heart Surgery;  Laterality: N/A;   TOTAL ABDOMINAL HYSTERECTOMY     TRANSCATHETER AORTIC VALVE REPLACEMENT, TRANSFEMORAL N/A 08/30/2015   Procedure: TRANSCATHETER AORTIC VALVE REPLACEMENT, TRANSFEMORAL;  Surgeon: Ozell Fell, MD;  Location: [facility] OR;  Service: Open Heart Surgery;  Laterality: N/A;    Social History   Socioeconomic History   Marital status: Married    Spouse name: Not on file   Number of children: Not on file   Years of education: Not on file   Highest education level: Not on file  Occupational History   Not on file  Tobacco Use   Smoking status: Never    Smokeless tobacco: Never  Vaping Use   Vaping status: Never Used  Substance and Sexual Activity   Alcohol use: No    Alcohol/week: 0.0 standard drinks of alcohol   Drug use: No   Sexual activity: Not Currently  Other Topics Concern   Not on file  Social History Narrative   ** Merged History Encounter **       Social Drivers of Corporate investment banker Strain: Not on file  Food Insecurity: Not on file  Transportation Needs: Not on file  Physical Activity: Not on file  Stress: Not on file  Social Connections: Not on file  Intimate Partner Violence: Not on file    Family History  Problem Relation Age of Onset   Depression Sister    Depression Sister    Depression Sister    Hypertension Mother    Lung cancer Sister    Stroke Sister    Hypertension Sister    Pneumonia Brother    Heart attack Neg Hx     Allergies  Allergen Reactions   Tetracyclines & Related Anaphylaxis and Rash   Cephalexin  Diarrhea   Sulfa Antibiotics Hives   Latex Rash    Outpatient Medications Prior to Visit  Medication Sig   acetaminophen  (TYLENOL ) 325 MG tablet Take 2 tablets (650 mg total) by mouth every 4 (four) hours as needed.   aspirin  EC 81 MG tablet Take 1 tablet (81 mg total) by mouth daily. Swallow whole. Hold this medication until you see your PCP and/or cardiologist   cholecalciferol  (VITAMIN D3) 25 MCG (1000 UNIT) tablet Take 1,000 Units by mouth daily.   clonazePAM  (KLONOPIN ) 0.5 MG tablet Take 1 tablet (0.5 mg total) by mouth at bedtime.   ezetimibe  (ZETIA ) 10 MG tablet TAKE 1 TABLET BY MOUTH DAILY  (NEEDS APPT)   isosorbide  mononitrate (IMDUR ) 30 MG 24 hr tablet TAKE 1 TABLET (30 MG) BY MOUTH DAILY. PT MUST MAKE AN APPOINTMENT IN ORDER TO RECEIVE FUTURE REFILLS   levothyroxine  (SYNTHROID ) 112 MCG tablet TAKE 1 TABLET BY MOUTH IN THE  MORNING ON AN EMPTY STOMACH 1  HOUR BEFORE OTHER MEDICATIONS OR FOOD   losartan  (COZAAR ) 50 MG tablet TAKE 1 TABLET BY MOUTH DAILY   metoprolol   tartrate (LOPRESSOR ) 25 MG tablet TAKE ONE-HALF TABLET BY MOUTH  TWICE DAILY   Multiple Vitamin (MULTIVITAMIN WITH MINERALS) TABS tablet Take 1 tablet by mouth daily.   nitroGLYCERIN  (NITROSTAT ) 0.4 MG SL tablet Place 1 tablet (0.4 mg total) under the tongue every 5 (five) minutes as needed for chest pain.   pantoprazole  (PROTONIX ) 40 MG tablet Take 40 mg by mouth daily.   polyethylene glycol powder (GLYCOLAX /MIRALAX ) 17 GM/SCOOP powder Take 17 g by mouth daily.  potassium chloride  (KLOR-CON  M) 10 MEQ tablet TAKE 1 TABLET BY MOUTH EVERY  OTHER DAY   rosuvastatin  (CRESTOR ) 10 MG tablet Take 1 tablet (10 mg total) by mouth daily.   sertraline  (ZOLOFT ) 100 MG tablet Take 1 tablet (100 mg total) by mouth daily. (Patient not taking: Reported on 05/21/2024)   traZODone  (DESYREL ) 50 MG tablet Take 1 tablet (50 mg total) by mouth at bedtime as needed for sleep.   Zinc  Oxide (TRIPLE PASTE) 12.8 % ointment Apply topically 4 (four) times daily.   No facility-administered medications prior to visit.    Review of Systems  Constitutional: Negative.  Negative for chills, fever and malaise/fatigue.  HENT:  Positive for hearing loss. Negative for congestion, sore throat and tinnitus.   Eyes: Negative.  Negative for blurred vision and pain.  Respiratory: Negative.  Negative for cough and shortness of breath.   Cardiovascular: Negative.  Negative for chest pain, palpitations and leg swelling.  Gastrointestinal: Negative.  Negative for abdominal pain, blood in stool, constipation, diarrhea, heartburn, melena, nausea and vomiting.  Genitourinary: Negative.  Negative for dysuria, flank pain, frequency and urgency.  Musculoskeletal: Negative.  Negative for joint pain and myalgias.  Skin: Negative.   Neurological: Negative.  Negative for dizziness, tingling, sensory change, focal weakness, weakness and headaches.  Endo/Heme/Allergies: Negative.   Psychiatric/Behavioral: Negative.  Negative for depression and  suicidal ideas. The patient is not nervous/anxious.        Objective:   BP 124/62   Pulse 69   Ht 5' 2 (1.575 m)   SpO2 98%   BMI 20.05 kg/m   Vitals:   09/08/24 1418  BP: 124/62  Pulse: 69  Height: 5' 2 (1.575 m)  SpO2: 98%    Physical Exam Vitals and nursing note reviewed.  Constitutional:      Appearance: Normal appearance.  HENT:     Head: Normocephalic and atraumatic.     Nose: Nose normal.     Mouth/Throat:     Mouth: Mucous membranes are moist.     Pharynx: Oropharynx is clear.  Eyes:     Conjunctiva/sclera: Conjunctivae normal.     Pupils: Pupils are equal, round, and reactive to light.  Cardiovascular:     Rate and Rhythm: Normal rate and regular rhythm.     Pulses: Normal pulses.     Heart sounds: Normal heart sounds. No murmur heard. Pulmonary:     Effort: Pulmonary effort is normal.     Breath sounds: Normal breath sounds. No wheezing.  Abdominal:     General: Bowel sounds are normal.     Palpations: Abdomen is soft.     Tenderness: There is no abdominal tenderness. There is no right CVA tenderness or left CVA tenderness.  Musculoskeletal:        General: Normal range of motion.     Cervical back: Normal range of motion.     Right lower leg: No edema.     Left lower leg: No edema.  Skin:    General: Skin is warm and dry.  Neurological:     General: No focal deficit present.     Mental Status: She is alert and oriented to person, place, and time.  Psychiatric:        Mood and Affect: Mood normal.        Behavior: Behavior normal.      No results found for any visits on 09/08/24.  Recent Results (from the past 2160 hours)  CUP PACEART REMOTE DEVICE  CHECK     Status: None   Collection Time: 07/09/24  2:00 AM  Result Value Ref Range   Date Time Interrogation Session [id number]    Pulse Generator Manufacturer [employer]    Pulse Gen Model 2240 Assurity    Pulse Gen Serial Number [id number]    Clinic Name [facility]    Implantable Pulse  Generator Type Implantable Pulse Generator    Implantable Pulse Generator Implant Date 79839071    Implantable Lead Manufacturer [facility]    Implantable Lead Model [id number] Tendril STS    Implantable Lead Serial Number [id number]    Implantable Lead Implant Date 79839071    Implantable Lead Location Detail 1 UNKNOWN    Implantable Lead Location [id number]    Implantable Lead Connection Status [id number]    Implantable Lead Manufacturer [facility]    Implantable Lead Model [id number] Tendril STS    Implantable Lead Serial Number [id number]    Implantable Lead Implant Date 79839071    Implantable Lead Location Detail 1 APEX    Implantable Lead Location [id number]    Implantable Lead Connection Status [id number]    Lead Channel Setting Sensing Sensitivity 2.0 mV   Lead Channel Setting Sensing Adaptation Mode Fixed Pacing    Lead Channel Setting Pacing Amplitude 1.75 V   Lead Channel Setting Pacing Pulse Width 0.4 ms   Lead Channel Setting Pacing Amplitude 1.125    Lead Channel Status NULL    Lead Channel Impedance Value 390 ohm   Lead Channel Sensing Intrinsic Amplitude 3.7 mV   Lead Channel Pacing Threshold Amplitude 0.75 V   Lead Channel Pacing Threshold Pulse Width 0.4 ms   Lead Channel Status NULL    Lead Channel Impedance Value 560 ohm   Lead Channel Sensing Intrinsic Amplitude 6.7 mV   Lead Channel Pacing Threshold Amplitude 0.875 V   Lead Channel Pacing Threshold Pulse Width 0.4 ms   Battery Status MOS    Battery Remaining Longevity 22 mo   Battery Remaining Percentage 17.0 %   Battery Voltage 2.86 V   Brady Statistic RA Percent Paced 42.0 %   Brady Statistic RV Percent Paced 32.0 %   Brady Statistic AP VP Percent 12.0 %   Brady Statistic AS VP Percent 21.0 %   Brady Statistic AP VS Percent 31.0 %   Brady Statistic AS VS Percent 36.0 %      Assessment & Plan:  Continue current meds. Labs today. Flu shot. Problem List Items Addressed This Visit     Essential hypertension (Chronic)   Relevant  Orders   CMP14+EGFR   CBC With Diff/Platelet   Hypothyroidism (Chronic)   Relevant Orders   CBC With Diff/Platelet   TSH   Hyperlipidemia   Relevant Orders   Lipid Profile   Other Visit Diagnoses       Needs flu shot    -  Primary   Relevant Orders   Flu Vaccine Trivalent High Dose (Fluad) (Completed)     Diabetes mellitus without complication (HCC)       Relevant Orders   HgB A1c       Return in about 4 months (around 01/09/2025).   Total time spent: 30 minutes  FERNAND FREDY RAMAN, MD  09/08/2024   This document may have been prepared by [facility] Voice Recognition software and as such may include unintentional dictation errors.

## 2024-09-09 ENCOUNTER — Ambulatory Visit: Payer: Self-pay | Admitting: Internal Medicine

## 2024-09-09 DIAGNOSIS — E038 Other specified hypothyroidism: Secondary | ICD-10-CM

## 2024-09-09 DIAGNOSIS — D649 Anemia, unspecified: Secondary | ICD-10-CM

## 2024-09-09 LAB — HEMOGLOBIN A1C
Est. average glucose Bld gHb Est-mCnc: 108 mg/dL
Hgb A1c MFr Bld: 5.4 % (ref 4.8–5.6)

## 2024-09-09 LAB — LIPID PANEL
Chol/HDL Ratio: 2.7 ratio (ref 0.0–4.4)
Cholesterol, Total: 146 mg/dL (ref 100–199)
HDL: 54 mg/dL (ref >39)
LDL Chol Calc (NIH): 80 mg/dL (ref 0–99)
Triglycerides: 59 mg/dL (ref 0–149)
VLDL Cholesterol Cal: 12 mg/dL (ref 5–40)

## 2024-09-09 LAB — CBC WITH DIFF/PLATELET
Basophils Absolute: 0.1 x10E3/uL (ref 0.0–0.2)
Basos: 1 %
EOS (ABSOLUTE): 0.2 x10E3/uL (ref 0.0–0.4)
Eos: 3 %
Hematocrit: 29.4 % — ABNORMAL LOW (ref 34.0–46.6)
Hemoglobin: 8.9 g/dL — ABNORMAL LOW (ref 11.1–15.9)
Immature Grans (Abs): 0 x10E3/uL (ref 0.0–0.1)
Immature Granulocytes: 0 %
Lymphocytes Absolute: 1.6 x10E3/uL (ref 0.7–3.1)
Lymphs: 24 %
MCH: 23.9 pg — ABNORMAL LOW (ref 26.6–33.0)
MCHC: 30.3 g/dL — ABNORMAL LOW (ref 31.5–35.7)
MCV: 79 fL (ref 79–97)
Monocytes Absolute: 0.7 x10E3/uL (ref 0.1–0.9)
Monocytes: 11 %
Neutrophils Absolute: 4.1 x10E3/uL (ref 1.4–7.0)
Neutrophils: 61 %
Platelets: 250 x10E3/uL (ref 150–450)
RBC: 3.72 x10E6/uL — ABNORMAL LOW (ref 3.77–5.28)
RDW: 14 % (ref 11.7–15.4)
WBC: 6.7 x10E3/uL (ref 3.4–10.8)

## 2024-09-09 LAB — CMP14+EGFR
ALT: 6 IU/L (ref 0–32)
AST: 18 IU/L (ref 0–40)
Albumin: 4 g/dL (ref 3.7–4.7)
Alkaline Phosphatase: 67 IU/L (ref 48–129)
BUN/Creatinine Ratio: 24 (ref 12–28)
BUN: 21 mg/dL (ref 8–27)
Bilirubin Total: 0.4 mg/dL (ref 0.0–1.2)
CO2: 27 mmol/L (ref 20–29)
Calcium: 9.8 mg/dL (ref 8.7–10.3)
Chloride: 98 mmol/L (ref 96–106)
Creatinine, Ser: 0.88 mg/dL (ref 0.57–1.00)
Globulin, Total: 2.7 g/dL (ref 1.5–4.5)
Glucose: 90 mg/dL (ref 70–99)
Potassium: 4.5 mmol/L (ref 3.5–5.2)
Sodium: 136 mmol/L (ref 134–144)
Total Protein: 6.7 g/dL (ref 6.0–8.5)
eGFR: 64 mL/min/1.73 (ref >59)

## 2024-09-09 LAB — TSH: TSH: 0.087 u[IU]/mL — ABNORMAL LOW (ref 0.450–4.500)

## 2024-09-09 MED ORDER — LEVOTHYROXINE SODIUM 100 MCG PO TABS: 100.0000 ug | ORAL_TABLET | Freq: Every day | ORAL | 11 refills | Status: AC

## 2024-09-14 NOTE — Progress Notes (Deleted)
 NO SHOW

## 2024-09-15 ENCOUNTER — Ambulatory Visit: Attending: Cardiovascular Disease | Admitting: Cardiovascular Disease

## 2024-09-15 DIAGNOSIS — I10 Essential (primary) hypertension: Secondary | ICD-10-CM

## 2024-09-15 DIAGNOSIS — I25118 Atherosclerotic heart disease of native coronary artery with other forms of angina pectoris: Secondary | ICD-10-CM

## 2024-09-15 DIAGNOSIS — I350 Nonrheumatic aortic (valve) stenosis: Secondary | ICD-10-CM

## 2024-09-15 DIAGNOSIS — Z950 Presence of cardiac pacemaker: Secondary | ICD-10-CM

## 2024-09-15 DIAGNOSIS — I5032 Chronic diastolic (congestive) heart failure: Secondary | ICD-10-CM

## 2024-09-15 DIAGNOSIS — I442 Atrioventricular block, complete: Secondary | ICD-10-CM

## 2024-10-08 ENCOUNTER — Ambulatory Visit (INDEPENDENT_AMBULATORY_CARE_PROVIDER_SITE_OTHER): Payer: Medicare Other

## 2024-10-08 DIAGNOSIS — I442 Atrioventricular block, complete: Secondary | ICD-10-CM

## 2024-10-08 LAB — CUP PACEART REMOTE DEVICE CHECK
Battery Remaining Longevity: 19 mo
Battery Remaining Percentage: 15 %
Battery Voltage: 2.86 V
Brady Statistic AP VP Percent: 10 %
Brady Statistic AP VS Percent: 30 %
Brady Statistic AS VP Percent: 23 %
Brady Statistic AS VS Percent: 37 %
Brady Statistic RA Percent Paced: 39 %
Brady Statistic RV Percent Paced: 33 %
Date Time Interrogation Session: 20251106020015
Implantable Lead Connection Status: 753985
Implantable Lead Connection Status: 753985
Implantable Lead Implant Date: 20160928
Implantable Lead Implant Date: 20160928
Implantable Lead Location: 753859
Implantable Lead Location: 753860
Implantable Pulse Generator Implant Date: 20160928
Lead Channel Impedance Value: 390 Ohm
Lead Channel Impedance Value: 540 Ohm
Lead Channel Pacing Threshold Amplitude: 0.75 V
Lead Channel Pacing Threshold Amplitude: 0.75 V
Lead Channel Pacing Threshold Pulse Width: 0.4 ms
Lead Channel Pacing Threshold Pulse Width: 0.4 ms
Lead Channel Sensing Intrinsic Amplitude: 4 mV
Lead Channel Sensing Intrinsic Amplitude: 6.7 mV
Lead Channel Setting Pacing Amplitude: 1 V
Lead Channel Setting Pacing Amplitude: 1.75 V
Lead Channel Setting Pacing Pulse Width: 0.4 ms
Lead Channel Setting Sensing Sensitivity: 2 mV
Pulse Gen Model: 2240
Pulse Gen Serial Number: 7779295

## 2024-10-10 ENCOUNTER — Other Ambulatory Visit (HOSPITAL_COMMUNITY): Payer: Self-pay | Admitting: Psychiatry

## 2024-10-10 DIAGNOSIS — F419 Anxiety disorder, unspecified: Secondary | ICD-10-CM

## 2024-10-10 DIAGNOSIS — F331 Major depressive disorder, recurrent, moderate: Secondary | ICD-10-CM

## 2024-10-12 ENCOUNTER — Ambulatory Visit: Payer: Self-pay | Admitting: Cardiology

## 2024-10-13 ENCOUNTER — Other Ambulatory Visit (HOSPITAL_COMMUNITY): Payer: Self-pay | Admitting: *Deleted

## 2024-10-13 ENCOUNTER — Telehealth (HOSPITAL_COMMUNITY): Payer: Self-pay

## 2024-10-13 DIAGNOSIS — F419 Anxiety disorder, unspecified: Secondary | ICD-10-CM

## 2024-10-13 DIAGNOSIS — F331 Major depressive disorder, recurrent, moderate: Secondary | ICD-10-CM

## 2024-10-13 MED ORDER — TRAZODONE HCL 50 MG PO TABS: 50.0000 mg | ORAL_TABLET | Freq: Every evening | ORAL | 1 refills | Status: DC | PRN

## 2024-10-13 NOTE — Telephone Encounter (Signed)
Trazodone sent.

## 2024-10-13 NOTE — Telephone Encounter (Signed)
 Patients sister called, she said that she had to reschedule her appt and she will need refills on the Klonopin  and Trazodone . Patient uses [facility] home delivery. Her follow up is now 12/10/2024

## 2024-10-13 NOTE — Progress Notes (Signed)
 Remote PPM Transmission

## 2024-10-13 NOTE — Telephone Encounter (Signed)
 Please provide bridge refills until her appointment.

## 2024-10-14 ENCOUNTER — Other Ambulatory Visit (HOSPITAL_COMMUNITY): Payer: Self-pay | Admitting: Psychiatry

## 2024-10-14 DIAGNOSIS — F419 Anxiety disorder, unspecified: Secondary | ICD-10-CM

## 2024-10-14 DIAGNOSIS — F331 Major depressive disorder, recurrent, moderate: Secondary | ICD-10-CM

## 2024-10-19 ENCOUNTER — Other Ambulatory Visit (HOSPITAL_COMMUNITY): Payer: Self-pay

## 2024-10-19 DIAGNOSIS — F419 Anxiety disorder, unspecified: Secondary | ICD-10-CM

## 2024-10-19 DIAGNOSIS — F331 Major depressive disorder, recurrent, moderate: Secondary | ICD-10-CM

## 2024-10-19 MED ORDER — CLONAZEPAM 0.5 MG PO TABS: 0.5000 mg | ORAL_TABLET | Freq: Every day | ORAL | 2 refills | Status: DC

## 2024-10-22 ENCOUNTER — Ambulatory Visit (HOSPITAL_COMMUNITY): Admitting: Psychiatry

## 2024-10-27 ENCOUNTER — Other Ambulatory Visit (HOSPITAL_COMMUNITY): Payer: Self-pay | Admitting: Psychiatry

## 2024-10-27 DIAGNOSIS — F331 Major depressive disorder, recurrent, moderate: Secondary | ICD-10-CM

## 2024-10-27 DIAGNOSIS — F419 Anxiety disorder, unspecified: Secondary | ICD-10-CM

## 2024-11-05 ENCOUNTER — Other Ambulatory Visit: Payer: Self-pay | Admitting: Cardiology

## 2024-11-05 ENCOUNTER — Other Ambulatory Visit: Payer: Self-pay | Admitting: Internal Medicine

## 2024-11-05 MED ORDER — POTASSIUM CHLORIDE CRYS ER 10 MEQ PO TBCR: 10.0000 meq | EXTENDED_RELEASE_TABLET | ORAL | 3 refills | Status: AC

## 2024-11-08 ENCOUNTER — Other Ambulatory Visit: Payer: Self-pay | Admitting: Cardiovascular Disease

## 2024-11-10 NOTE — Telephone Encounter (Signed)
Please contact pt for future appointment. 

## 2024-11-24 ENCOUNTER — Other Ambulatory Visit (HOSPITAL_COMMUNITY): Payer: Self-pay | Admitting: Psychiatry

## 2024-11-24 DIAGNOSIS — F331 Major depressive disorder, recurrent, moderate: Secondary | ICD-10-CM

## 2024-11-24 DIAGNOSIS — F419 Anxiety disorder, unspecified: Secondary | ICD-10-CM

## 2024-12-10 ENCOUNTER — Ambulatory Visit (HOSPITAL_BASED_OUTPATIENT_CLINIC_OR_DEPARTMENT_OTHER): Admitting: Psychiatry

## 2024-12-10 ENCOUNTER — Encounter (HOSPITAL_COMMUNITY): Payer: Self-pay | Admitting: Psychiatry

## 2024-12-10 ENCOUNTER — Other Ambulatory Visit: Payer: Self-pay | Admitting: Cardiovascular Disease

## 2024-12-10 ENCOUNTER — Other Ambulatory Visit: Payer: Self-pay

## 2024-12-10 DIAGNOSIS — F331 Major depressive disorder, recurrent, moderate: Secondary | ICD-10-CM | POA: Diagnosis not present

## 2024-12-10 DIAGNOSIS — F419 Anxiety disorder, unspecified: Secondary | ICD-10-CM | POA: Diagnosis not present

## 2024-12-10 MED ORDER — CLONAZEPAM 0.5 MG PO TABS: 0.5000 mg | ORAL_TABLET | Freq: Every day | ORAL | 5 refills | Status: AC

## 2024-12-10 MED ORDER — TRAZODONE HCL 50 MG PO TABS: 50.0000 mg | ORAL_TABLET | Freq: Every evening | ORAL | 1 refills | Status: AC | PRN
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
## 2025-01-07 ENCOUNTER — Ambulatory Visit: Payer: Self-pay

## 2025-01-08 LAB — CUP PACEART REMOTE DEVICE CHECK
Battery Remaining Longevity: 17 mo
Battery Remaining Percentage: 13 %
Battery Voltage: 2.84 V
Brady Statistic AP VP Percent: 9.8 %
Brady Statistic AP VS Percent: 30 %
Brady Statistic AS VP Percent: 23 %
Brady Statistic AS VS Percent: 37 %
Brady Statistic RA Percent Paced: 39 %
Brady Statistic RV Percent Paced: 32 %
Date Time Interrogation Session: 20260205020015
Implantable Lead Connection Status: 753985
Implantable Lead Connection Status: 753985
Implantable Lead Implant Date: 20160928
Implantable Lead Implant Date: 20160928
Implantable Lead Location: 753859
Implantable Lead Location: 753860
Implantable Pulse Generator Implant Date: 20160928
Lead Channel Impedance Value: 400 Ohm
Lead Channel Impedance Value: 560 Ohm
Lead Channel Pacing Threshold Amplitude: 0.75 V
Lead Channel Pacing Threshold Amplitude: 0.75 V
Lead Channel Pacing Threshold Pulse Width: 0.4 ms
Lead Channel Pacing Threshold Pulse Width: 0.4 ms
Lead Channel Sensing Intrinsic Amplitude: 4.1 mV
Lead Channel Sensing Intrinsic Amplitude: 9.7 mV
Lead Channel Setting Pacing Amplitude: 1 V
Lead Channel Setting Pacing Amplitude: 1.75 V
Lead Channel Setting Pacing Pulse Width: 0.4 ms
Lead Channel Setting Sensing Sensitivity: 2 mV
Pulse Gen Model: 2240
Pulse Gen Serial Number: 7779295

## 2025-01-11 ENCOUNTER — Ambulatory Visit: Admitting: Internal Medicine

## 2025-04-08 ENCOUNTER — Ambulatory Visit: Payer: Self-pay

## 2025-06-10 ENCOUNTER — Ambulatory Visit (HOSPITAL_COMMUNITY): Admitting: Psychiatry

## 2025-07-08 ENCOUNTER — Ambulatory Visit: Payer: Self-pay

## 2025-10-07 ENCOUNTER — Ambulatory Visit: Payer: Self-pay

## 2026-01-06 ENCOUNTER — Ambulatory Visit: Payer: Self-pay

## 2026-04-07 ENCOUNTER — Ambulatory Visit: Payer: Self-pay
